# Patient Record
Sex: Female | Born: 1985 | Race: Black or African American | Hispanic: No | Marital: Single | Smoking: Current some day smoker
Health system: Southern US, Community
[De-identification: ages and names within clinical notes are randomized; demographics above are authoritative.]

## PROBLEM LIST (undated history)

## (undated) DIAGNOSIS — N390 Urinary tract infection, site not specified: Secondary | ICD-10-CM

## (undated) DIAGNOSIS — R519 Headache, unspecified: Secondary | ICD-10-CM

## (undated) DIAGNOSIS — A749 Chlamydial infection, unspecified: Secondary | ICD-10-CM

## (undated) DIAGNOSIS — F32A Depression, unspecified: Secondary | ICD-10-CM

## (undated) DIAGNOSIS — F419 Anxiety disorder, unspecified: Secondary | ICD-10-CM

## (undated) DIAGNOSIS — N809 Endometriosis, unspecified: Secondary | ICD-10-CM

## (undated) DIAGNOSIS — E039 Hypothyroidism, unspecified: Secondary | ICD-10-CM

## (undated) DIAGNOSIS — E079 Disorder of thyroid, unspecified: Secondary | ICD-10-CM

## (undated) DIAGNOSIS — E282 Polycystic ovarian syndrome: Secondary | ICD-10-CM

## (undated) DIAGNOSIS — E669 Obesity, unspecified: Secondary | ICD-10-CM

## (undated) DIAGNOSIS — F329 Major depressive disorder, single episode, unspecified: Secondary | ICD-10-CM

## (undated) DIAGNOSIS — R079 Chest pain, unspecified: Secondary | ICD-10-CM

## (undated) DIAGNOSIS — I10 Essential (primary) hypertension: Secondary | ICD-10-CM

## (undated) DIAGNOSIS — D649 Anemia, unspecified: Secondary | ICD-10-CM

## (undated) DIAGNOSIS — R0789 Other chest pain: Secondary | ICD-10-CM

## (undated) DIAGNOSIS — R112 Nausea with vomiting, unspecified: Secondary | ICD-10-CM

## (undated) DIAGNOSIS — R0602 Shortness of breath: Secondary | ICD-10-CM

## (undated) DIAGNOSIS — Z9889 Other specified postprocedural states: Secondary | ICD-10-CM

## (undated) DIAGNOSIS — R51 Headache: Secondary | ICD-10-CM

## (undated) HISTORY — DX: Morbid (severe) obesity due to excess calories: E66.01

## (undated) HISTORY — DX: Essential (primary) hypertension: I10

## (undated) HISTORY — PX: DILATION AND CURETTAGE OF UTERUS: SHX78

## (undated) HISTORY — PX: WISDOM TOOTH EXTRACTION: SHX21

## (undated) HISTORY — DX: Polycystic ovarian syndrome: E28.2

## (undated) HISTORY — PX: NO PAST SURGERIES: SHX2092

---
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
## 2004-06-16 ENCOUNTER — Emergency Department (HOSPITAL_COMMUNITY): Admission: EM | Admit: 2004-06-16 | Discharge: 2004-06-16 | Payer: Self-pay | Admitting: Emergency Medicine

## 2004-08-01 ENCOUNTER — Ambulatory Visit: Payer: Self-pay | Admitting: Family Medicine

## 2004-09-22 ENCOUNTER — Emergency Department (HOSPITAL_COMMUNITY): Admission: EM | Admit: 2004-09-22 | Discharge: 2004-09-22 | Payer: Self-pay | Admitting: Family Medicine

## 2004-09-23 ENCOUNTER — Inpatient Hospital Stay (HOSPITAL_COMMUNITY): Admission: EM | Admit: 2004-09-23 | Discharge: 2004-09-25 | Payer: Self-pay

## 2004-11-07 ENCOUNTER — Ambulatory Visit: Payer: Self-pay | Admitting: Family Medicine

## 2004-12-13 ENCOUNTER — Encounter: Admission: RE | Admit: 2004-12-13 | Discharge: 2005-03-13 | Payer: Self-pay | Admitting: Family Medicine

## 2005-02-10 ENCOUNTER — Emergency Department (HOSPITAL_COMMUNITY): Admission: EM | Admit: 2005-02-10 | Discharge: 2005-02-10 | Payer: Self-pay | Admitting: Family Medicine

## 2005-03-07 ENCOUNTER — Inpatient Hospital Stay (HOSPITAL_COMMUNITY): Admission: AD | Admit: 2005-03-07 | Discharge: 2005-03-07 | Payer: Self-pay | Admitting: Obstetrics and Gynecology

## 2005-04-26 ENCOUNTER — Inpatient Hospital Stay (HOSPITAL_COMMUNITY): Admission: AD | Admit: 2005-04-26 | Discharge: 2005-04-27 | Payer: Self-pay | Admitting: Obstetrics and Gynecology

## 2005-05-22 ENCOUNTER — Emergency Department (HOSPITAL_COMMUNITY): Admission: EM | Admit: 2005-05-22 | Discharge: 2005-05-22 | Payer: Self-pay | Admitting: Family Medicine

## 2005-06-02 ENCOUNTER — Emergency Department (HOSPITAL_COMMUNITY): Admission: EM | Admit: 2005-06-02 | Discharge: 2005-06-02 | Payer: Self-pay | Admitting: Family Medicine

## 2005-06-17 ENCOUNTER — Emergency Department (HOSPITAL_COMMUNITY): Admission: EM | Admit: 2005-06-17 | Discharge: 2005-06-18 | Payer: Self-pay | Admitting: Emergency Medicine

## 2005-06-21 ENCOUNTER — Emergency Department (HOSPITAL_COMMUNITY): Admission: EM | Admit: 2005-06-21 | Discharge: 2005-06-21 | Payer: Self-pay | Admitting: Emergency Medicine

## 2005-06-29 ENCOUNTER — Emergency Department (HOSPITAL_COMMUNITY): Admission: EM | Admit: 2005-06-29 | Discharge: 2005-06-29 | Payer: Self-pay | Admitting: Family Medicine

## 2005-07-26 ENCOUNTER — Emergency Department (HOSPITAL_COMMUNITY): Admission: AD | Admit: 2005-07-26 | Discharge: 2005-07-26 | Payer: Self-pay | Admitting: Family Medicine

## 2005-07-31 ENCOUNTER — Emergency Department (HOSPITAL_COMMUNITY): Admission: EM | Admit: 2005-07-31 | Discharge: 2005-07-31 | Payer: Self-pay | Admitting: Family Medicine

## 2005-08-14 ENCOUNTER — Ambulatory Visit: Payer: Self-pay | Admitting: Family Medicine

## 2005-09-06 ENCOUNTER — Emergency Department (HOSPITAL_COMMUNITY): Admission: EM | Admit: 2005-09-06 | Discharge: 2005-09-06 | Payer: Self-pay | Admitting: Family Medicine

## 2006-03-14 ENCOUNTER — Inpatient Hospital Stay (HOSPITAL_COMMUNITY): Admission: AD | Admit: 2006-03-14 | Discharge: 2006-03-14 | Payer: Self-pay | Admitting: Obstetrics and Gynecology

## 2006-04-10 ENCOUNTER — Ambulatory Visit (HOSPITAL_COMMUNITY): Admission: RE | Admit: 2006-04-10 | Discharge: 2006-04-10 | Payer: Self-pay | Admitting: Obstetrics & Gynecology

## 2006-05-03 ENCOUNTER — Inpatient Hospital Stay (HOSPITAL_COMMUNITY): Admission: AD | Admit: 2006-05-03 | Discharge: 2006-05-03 | Payer: Self-pay | Admitting: Obstetrics & Gynecology

## 2006-06-03 ENCOUNTER — Emergency Department (HOSPITAL_COMMUNITY): Admission: EM | Admit: 2006-06-03 | Discharge: 2006-06-03 | Payer: Self-pay | Admitting: Emergency Medicine

## 2006-12-18 ENCOUNTER — Ambulatory Visit: Payer: Self-pay | Admitting: Family Medicine

## 2006-12-21 ENCOUNTER — Ambulatory Visit: Payer: Self-pay | Admitting: *Deleted

## 2006-12-31 ENCOUNTER — Ambulatory Visit: Payer: Self-pay | Admitting: Family Medicine

## 2007-01-01 ENCOUNTER — Emergency Department (HOSPITAL_COMMUNITY): Admission: EM | Admit: 2007-01-01 | Discharge: 2007-01-01 | Payer: Self-pay | Admitting: Family Medicine

## 2007-02-04 ENCOUNTER — Ambulatory Visit: Payer: Self-pay | Admitting: Family Medicine

## 2007-06-30 ENCOUNTER — Encounter (INDEPENDENT_AMBULATORY_CARE_PROVIDER_SITE_OTHER): Payer: Self-pay | Admitting: *Deleted

## 2007-08-03 ENCOUNTER — Emergency Department (HOSPITAL_COMMUNITY): Admission: EM | Admit: 2007-08-03 | Discharge: 2007-08-03 | Payer: Self-pay | Admitting: Emergency Medicine

## 2007-09-22 ENCOUNTER — Inpatient Hospital Stay (HOSPITAL_COMMUNITY): Admission: AD | Admit: 2007-09-22 | Discharge: 2007-09-22 | Payer: Self-pay | Admitting: Obstetrics and Gynecology

## 2007-09-29 ENCOUNTER — Emergency Department (HOSPITAL_COMMUNITY): Admission: EM | Admit: 2007-09-29 | Discharge: 2007-09-30 | Payer: Self-pay | Admitting: Emergency Medicine

## 2008-05-01 ENCOUNTER — Inpatient Hospital Stay (HOSPITAL_COMMUNITY): Admission: AD | Admit: 2008-05-01 | Discharge: 2008-05-01 | Payer: Self-pay | Admitting: Obstetrics & Gynecology

## 2008-07-04 ENCOUNTER — Inpatient Hospital Stay (HOSPITAL_COMMUNITY): Admission: AD | Admit: 2008-07-04 | Discharge: 2008-07-05 | Payer: Self-pay | Admitting: Obstetrics & Gynecology

## 2008-09-13 ENCOUNTER — Inpatient Hospital Stay (HOSPITAL_COMMUNITY): Admission: AD | Admit: 2008-09-13 | Discharge: 2008-09-14 | Payer: Self-pay | Admitting: Obstetrics and Gynecology

## 2008-10-18 ENCOUNTER — Emergency Department (HOSPITAL_COMMUNITY): Admission: EM | Admit: 2008-10-18 | Discharge: 2008-10-18 | Payer: Self-pay | Admitting: Family Medicine

## 2008-12-05 ENCOUNTER — Emergency Department (HOSPITAL_COMMUNITY): Admission: EM | Admit: 2008-12-05 | Discharge: 2008-12-05 | Payer: Self-pay | Admitting: Emergency Medicine

## 2009-01-31 ENCOUNTER — Emergency Department (HOSPITAL_COMMUNITY): Admission: EM | Admit: 2009-01-31 | Discharge: 2009-01-31 | Payer: Self-pay | Admitting: Family Medicine

## 2009-03-12 ENCOUNTER — Emergency Department (HOSPITAL_COMMUNITY): Admission: EM | Admit: 2009-03-12 | Discharge: 2009-03-12 | Payer: Self-pay | Admitting: Family Medicine

## 2009-08-23 ENCOUNTER — Inpatient Hospital Stay (HOSPITAL_COMMUNITY): Admission: AD | Admit: 2009-08-23 | Discharge: 2009-08-23 | Payer: Self-pay | Admitting: Family Medicine

## 2009-11-07 ENCOUNTER — Inpatient Hospital Stay (HOSPITAL_COMMUNITY): Admission: AD | Admit: 2009-11-07 | Discharge: 2009-11-08 | Payer: Self-pay | Admitting: Obstetrics & Gynecology

## 2009-12-06 ENCOUNTER — Ambulatory Visit: Payer: Self-pay | Admitting: Obstetrics and Gynecology

## 2009-12-06 LAB — CONVERTED CEMR LAB
FSH: 5.5 milliintl units/mL
TSH: 1.625 microintl units/mL (ref 0.350–4.500)

## 2009-12-13 ENCOUNTER — Ambulatory Visit (HOSPITAL_COMMUNITY): Admission: RE | Admit: 2009-12-13 | Discharge: 2009-12-13 | Payer: Self-pay | Admitting: Obstetrics and Gynecology

## 2010-01-03 ENCOUNTER — Ambulatory Visit: Payer: Self-pay | Admitting: Obstetrics and Gynecology

## 2010-04-05 ENCOUNTER — Ambulatory Visit: Payer: Self-pay | Admitting: Obstetrics & Gynecology

## 2010-04-06 ENCOUNTER — Encounter: Payer: Self-pay | Admitting: Family

## 2010-04-06 LAB — CONVERTED CEMR LAB
Hemoglobin: 13.3 g/dL (ref 12.0–15.0)
RBC: 4.66 M/uL (ref 3.87–5.11)
WBC: 6.1 10*3/uL (ref 4.0–10.5)

## 2010-06-07 ENCOUNTER — Ambulatory Visit: Payer: Self-pay | Admitting: Obstetrics & Gynecology

## 2010-06-07 ENCOUNTER — Encounter: Payer: Self-pay | Admitting: Physician Assistant

## 2010-06-08 ENCOUNTER — Encounter: Payer: Self-pay | Admitting: Physician Assistant

## 2010-06-08 LAB — CONVERTED CEMR LAB
Trich, Wet Prep: NONE SEEN
WBC, Wet Prep HPF POC: NONE SEEN
Yeast Wet Prep HPF POC: NONE SEEN

## 2010-06-21 ENCOUNTER — Ambulatory Visit: Payer: Self-pay | Admitting: Obstetrics & Gynecology

## 2010-07-31 ENCOUNTER — Ambulatory Visit: Payer: Self-pay | Admitting: Obstetrics and Gynecology

## 2010-07-31 LAB — CONVERTED CEMR LAB
Trich, Wet Prep: NONE SEEN
Yeast Wet Prep HPF POC: NONE SEEN

## 2010-10-04 ENCOUNTER — Ambulatory Visit: Payer: Self-pay | Admitting: Obstetrics and Gynecology

## 2010-10-23 ENCOUNTER — Ambulatory Visit
Admission: RE | Admit: 2010-10-23 | Discharge: 2010-10-23 | Payer: Self-pay | Source: Home / Self Care | Attending: Obstetrics & Gynecology | Admitting: Obstetrics & Gynecology

## 2010-12-04 NOTE — Progress Notes (Signed)
NAME:  Lori Mcknight, Lori Mcknight                  ACCOUNT NO.:  [id number]  MEDICAL RECORD NO.:  [id number]           PATIENT TYPE:  LOCATION:  [facility]                     FACILITY:  PHYSICIAN:  Jaynie Collins, MD     DATE OF BIRTH:  September 20, 1986  DATE OF SERVICE:  10/23/2010                                 CLINIC NOTE  REASON FOR VISIT:  Followup PCOS.  The patient is a 25 year old nulligravida with a long history of PCOS and morbid obesity who has been treated with metformin here for followup.  The patient reports that she is only on metformin 1000 mg extended release at night and wants to see if she can increase her dosage to see if this can help with her ovulation.  She has lost 5 pounds since the last time she was seen and said that she is trying very hard to lose more weight.  She still complains of irregular periods and had her last menstrual cycle in December.  She says she has a longer period every time she is given a Provera challenge.  The patient wants to get pregnant soon and wants to know if there is anything else she could do.  She has no other symptoms.  PHYSICAL EXAMINATION:  VITAL SIGNS:  Stable.  Her blood pressure is 136/91, weight 322.6 pounds, height 66 inches. GENERAL:  No apparent distress. ABDOMEN:  Soft, nontender, nondistended. PELVIC:  Deferred. EXTREMITIES:  No cyanosis, clubbing, or edema.  ASSESSMENT AND PLAN:  The patient is a 25 year old nulligravida with polycystic ovarian syndrome here for followup.  As for treatment for PCOS, her dose was increased to 1500 mg of metformin ER at night.  The patient was instructed to take this with food.  As for infertility assistance, the patient was told that she could be a part of the Infertility [facility] here, but given her longstanding history of PCOS, she might benefit for a referral to a fertility specialist who will be able to do more than just clomiphene induction.  The patient does agree with this plan and will be  referred to Dr. April Manson, who is a reproductive and endocrinology and infertility specialist for further evaluation. The patient was told to call or come back in if she has any further gynecologic concerns and was commended on her weight loss and encouraged to continue to do so as this will help with her ovulation status and also her overall health.          ______________________________ Jaynie Collins, MD    UA/MEDQ  D:  10/23/2010  T:  10/24/2010  Job:  [id number]

## 2010-12-29 LAB — CBC
Hemoglobin: 12 g/dL (ref 12.0–15.0)
MCHC: 32.8 g/dL (ref 30.0–36.0)
MCV: 96.4 fL (ref 78.0–100.0)
RBC: 3.8 MIL/uL — ABNORMAL LOW (ref 3.87–5.11)
RDW: 13.1 % (ref 11.5–15.5)

## 2010-12-29 LAB — POCT PREGNANCY, URINE: Preg Test, Ur: NEGATIVE

## 2010-12-29 LAB — GC/CHLAMYDIA PROBE AMP, GENITAL
Chlamydia, DNA Probe: NEGATIVE
GC Probe Amp, Genital: NEGATIVE

## 2010-12-29 LAB — URINE MICROSCOPIC-ADD ON

## 2010-12-29 LAB — URINALYSIS, ROUTINE W REFLEX MICROSCOPIC
Bilirubin Urine: NEGATIVE
Glucose, UA: NEGATIVE mg/dL
Specific Gravity, Urine: 1.01 (ref 1.005–1.030)

## 2010-12-29 LAB — SAMPLE TO BLOOD BANK

## 2010-12-29 LAB — WET PREP, GENITAL: Clue Cells Wet Prep HPF POC: NONE SEEN

## 2011-01-15 LAB — WET PREP, GENITAL
Trich, Wet Prep: NONE SEEN
Yeast Wet Prep HPF POC: NONE SEEN

## 2011-01-15 LAB — GC/CHLAMYDIA PROBE AMP, GENITAL: GC Probe Amp, Genital: NEGATIVE

## 2011-01-15 LAB — POCT PREGNANCY, URINE: Preg Test, Ur: NEGATIVE

## 2011-02-28 NOTE — H&P (Signed)
NAME:  Lori Mcknight, Lori Mcknight                  ACCOUNT NO.:  [id number]   MEDICAL RECORD NO.:  [id number]          PATIENT TYPE:  INP   LOCATION:  0365                         FACILITY:  [facility]   PHYSICIAN:  Hollice Espy, M.D.DATE OF BIRTH:  08-23-1986   DATE OF ADMISSION:  09/22/2004  DATE OF DISCHARGE:                                HISTORY & PHYSICAL   PRIMARY CARE PHYSICIAN:  None.   CHIEF COMPLAINT:  Shortness of breath.   HISTORY OF PRESENT ILLNESS:  The patient is a 25 year old African-American  female with a past medical history of asthma x several years who normally  has had no severe exacerbation.  She said she has never had to come to the  hospital and rarely takes an inhaler.  In fact, she tells me right now she  is not on any medications at home.  She started having shortness of breath  developing yesterday.  It began to worsen and finally she became concerned  and came in on the evening of September 22, 2004.  At that time, it was felt  she had an asthma exacerbation.  She was given multiple doses of steroids,  nebulizer treatments, magnesium sulfate, and oxygen.  Initially on  presentation, her O2 saturations were 92% on room air when sitting but she  appeared to be in great respiratory distress.  When ambulating, she would  drop down to 88%.  She eventually required oxygen 3 L to keep her breathing  going.  Finally after multiple breathing attempts, it was felt that she  would not be able to safely be discharged from the emergency room and would  need admission for further treatment.  Upon discussion with the patient, she  complains of some tightness in her chest secondary to her breathing and  feels like it is hard to get a breath out.  She denies any headaches, visual  changes, or dysphagia.  She does complain of palpitations.  She denies any  productive cough.  She denies any abdominal pain, hematuria, dysuria,  constipation, or diarrhea.  She denies any focal extremity  pain.  Overall  she feels quite fatigued.   PAST MEDICAL HISTORY:  Asthma, hypertension, morbid obesity.   MEDICATIONS:  She states that she is not on any home medications.   ALLERGIES:  No known drug allergies.   SOCIAL HISTORY:  She denies any tobacco, alcohol, or drug use.   FAMILY HISTORY:  Noncontributory.   PHYSICAL EXAMINATION:  VITAL SIGNS:  On admission, temperature 98, heart  rate 124, blood pressure 134/63, respirations 20, O2 saturation 94% on 3 L.  GENERAL:  She appears to be morbidly obese but alert and oriented, in some  mild respiratory distress.  HEENT:  Normocephalic, atraumatic.  Mucous membranes are dry.  She has no  carotid bruits.  HEART:  Regular rhythm, S1 and S2, but tachycardic.  LUNGS:  Decreased breath sounds with bilateral wheezing throughout.  ABDOMEN:  Obese, soft, nontender, positive bowel sounds.  EXTREMITIES:  She is obese but no pitting edema.   LABORATORY DATA:  None.  X-ray shows no  focal signs of consolidation.   ASSESSMENT AND PLAN:  1.  Asthma exacerbation despite multiple attempts with nebulizers and      steroids and magnesium.  The patient is still wheezy and tight, still      has oxygen desaturations.  Twenty-four hour observation to treat with      more oxygen, steroids, and nebulizers and check a peak flow.  2.  Hypertension.  She is not on any medications normally.  Will add      Lopressor to control her heart rate from her albuterol.  3.  Morbid obesity.     Send   SKK/MEDQ  D:  09/23/2004  T:  09/23/2004  Job:  [id number]

## 2011-03-05 ENCOUNTER — Other Ambulatory Visit: Payer: Self-pay | Admitting: Obstetrics and Gynecology

## 2011-03-05 ENCOUNTER — Ambulatory Visit (INDEPENDENT_AMBULATORY_CARE_PROVIDER_SITE_OTHER): Payer: Self-pay | Admitting: Obstetrics and Gynecology

## 2011-03-05 DIAGNOSIS — Z01419 Encounter for gynecological examination (general) (routine) without abnormal findings: Secondary | ICD-10-CM

## 2011-03-05 DIAGNOSIS — Z124 Encounter for screening for malignant neoplasm of cervix: Secondary | ICD-10-CM

## 2011-03-06 NOTE — Group Therapy Note (Signed)
NAME:  Lori Mcknight, Lori Mcknight                  ACCOUNT NO.:  [id number]  MEDICAL RECORD NO.:  [id number]           PATIENT TYPE:  A  LOCATION:  [facility]                   FACILITY:  [facility]  PHYSICIAN:  Argentina Donovan, MD        DATE OF BIRTH:  01-13-1986  DATE OF SERVICE:  03/05/2011                                 CLINIC NOTE  HISTORY OF PRESENT ILLNESS:  The patient is a 25 year old African American female with polycystic ovarian syndrome in for an annual Pap smear.  PHYSICAL EXAMINATION:  ABDOMEN:  Soft, flat, obese, and no organomegaly palpable. PELVIC:  External genitalia is normal.  BUS within normal limits. Vagina is clean, well rugated.  Cervix easily seen in the anterior with a normal uterus of normal size, shape, consistency, retroverted, and easily palpable.  The adnexa obviously could not be palpable because of habitus of the patient 329 pounds and 5 feet 6 inches tall.  Her periods have always been irregular.  She has been on metformin on and off.  She had some difficulty taking them, so I have told her what we will do is start her on 500 b.i.d. with food for 2 weeks and then we will go up to 1000 as she is taking this after a couple of months.  If she is not completely regular with her periods, come in and we will add Clomid to that as she is attempting pregnancy.  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          ______________________________ Argentina Donovan, MD    PR/MEDQ  D:  03/05/2011  T:  03/06/2011  Job:  [id number]

## 2011-03-30 ENCOUNTER — Inpatient Hospital Stay (HOSPITAL_COMMUNITY)
Admission: AD | Admit: 2011-03-30 | Discharge: 2011-03-31 | Disposition: A | Discharge status: Home / Self Care | Payer: Self-pay | Source: Ambulatory Visit | Attending: Obstetrics and Gynecology | Admitting: Obstetrics and Gynecology

## 2011-03-30 DIAGNOSIS — R109 Unspecified abdominal pain: Secondary | ICD-10-CM | POA: Insufficient documentation

## 2011-03-30 DIAGNOSIS — N946 Dysmenorrhea, unspecified: Secondary | ICD-10-CM | POA: Insufficient documentation

## 2011-03-30 LAB — POCT PREGNANCY, URINE: Preg Test, Ur: NEGATIVE

## 2011-03-30 LAB — CBC
Hemoglobin: 13.6 g/dL (ref 12.0–15.0)
MCH: 31.4 pg (ref 26.0–34.0)
RBC: 4.33 MIL/uL (ref 3.87–5.11)

## 2011-03-30 LAB — URINALYSIS, ROUTINE W REFLEX MICROSCOPIC
Bilirubin Urine: NEGATIVE
Glucose, UA: NEGATIVE mg/dL
Hgb urine dipstick: NEGATIVE
Ketones, ur: NEGATIVE mg/dL
Protein, ur: NEGATIVE mg/dL
Urobilinogen, UA: 0.2 mg/dL (ref 0.0–1.0)

## 2011-03-30 LAB — DIFFERENTIAL
Basophils Absolute: 0 10*3/uL (ref 0.0–0.1)
Basophils Relative: 0 % (ref 0–1)
Eosinophils Absolute: 0.1 10*3/uL (ref 0.0–0.7)
Lymphs Abs: 4.1 10*3/uL — ABNORMAL HIGH (ref 0.7–4.0)
Monocytes Relative: 5 % (ref 3–12)
Neutrophils Relative %: 51 % (ref 43–77)

## 2011-03-31 ENCOUNTER — Inpatient Hospital Stay (HOSPITAL_COMMUNITY): Payer: Self-pay

## 2011-03-31 LAB — WET PREP, GENITAL: Trich, Wet Prep: NONE SEEN

## 2011-04-21 ENCOUNTER — Telehealth: Payer: Self-pay | Admitting: *Deleted

## 2011-04-29 MED ORDER — FLUCONAZOLE 150 MG PO TABS: 150.0000 mg | ORAL_TABLET | Freq: Once | ORAL | Status: AC

## 2011-04-29 NOTE — Telephone Encounter (Signed)
Spoke w/pt- med called to pharmacy per standing order

## 2011-06-19 ENCOUNTER — Ambulatory Visit (INDEPENDENT_AMBULATORY_CARE_PROVIDER_SITE_OTHER): Payer: Self-pay | Admitting: Obstetrics and Gynecology

## 2011-06-19 ENCOUNTER — Encounter: Payer: Self-pay | Admitting: Obstetrics and Gynecology

## 2011-06-19 VITALS — BP 129/91 | HR 102 | Temp 96.7°F | Ht 67.0 in | Wt 327.4 lb

## 2011-06-19 DIAGNOSIS — E282 Polycystic ovarian syndrome: Secondary | ICD-10-CM

## 2011-06-19 DIAGNOSIS — IMO0002 Reserved for concepts with insufficient information to code with codable children: Secondary | ICD-10-CM

## 2011-06-19 DIAGNOSIS — K651 Peritoneal abscess: Secondary | ICD-10-CM

## 2011-06-19 MED ORDER — CEPHALEXIN 500 MG PO CAPS: 500.0000 mg | ORAL_CAPSULE | Freq: Four times a day (QID) | ORAL | Status: AC

## 2011-06-19 MED ORDER — CLOMIPHENE CITRATE 50 MG PO TABS: 50.0000 mg | ORAL_TABLET | Freq: Every day | ORAL | Status: DC

## 2011-06-19 NOTE — Progress Notes (Signed)
This patient is a 25 year old African American female with polycystic ovarian syndrome. She's been on Provera first 10 days of each month for the past several months. She's also tried metformin but couldn't tolerate it because of the side effects. She desires to tried Clomid we spent a long time discussing the temperature chart how Clomid works dangers of Clomid and how to take it. Were going to give her 50 mg of Clomid from day 5 through 9 of her cycle. Will include 3 renewal. I've told her she is not pregnant by the time she uses up to 3 cycles to please come back in. She seems to understand how to take it in the temperature chart seem clear to her as well as her mother.  Impression: Polycystic ovarian syndrome attempting pregnancy.

## 2011-06-20 ENCOUNTER — Emergency Department (HOSPITAL_COMMUNITY)
Admission: EM | Admit: 2011-06-20 | Discharge: 2011-06-21 | Disposition: A | Discharge status: Home / Self Care | Payer: Self-pay | Attending: Emergency Medicine | Admitting: Emergency Medicine

## 2011-06-20 DIAGNOSIS — L02219 Cutaneous abscess of trunk, unspecified: Secondary | ICD-10-CM | POA: Insufficient documentation

## 2011-06-20 DIAGNOSIS — I10 Essential (primary) hypertension: Secondary | ICD-10-CM | POA: Insufficient documentation

## 2011-06-22 ENCOUNTER — Emergency Department (HOSPITAL_COMMUNITY)
Admission: EM | Admit: 2011-06-22 | Discharge: 2011-06-22 | Disposition: A | Discharge status: Home / Self Care | Payer: Self-pay | Attending: Emergency Medicine | Admitting: Emergency Medicine

## 2011-06-22 DIAGNOSIS — L02219 Cutaneous abscess of trunk, unspecified: Secondary | ICD-10-CM | POA: Insufficient documentation

## 2011-06-25 ENCOUNTER — Telehealth: Payer: Self-pay | Admitting: *Deleted

## 2011-06-25 NOTE — Telephone Encounter (Signed)
Spoke with Dr. Okey Dupre re: patient request- Dr. Okey Dupre not familiar with Fertile Aid- called pharmacy and was told it is basically a herbal remedy that doesn't have proven clinical studies to show if it helps or harms patients. Dr. Okey Dupre advised for pt to only take clomid, prenatal vitamin with folic acid and to not take fertile aid or primrose. Called patient and explained to her Dr. Okey Dupre wants her not to take Fertile Aid or primrose because there  are no studies to show if they help or harm patients  Or if they  affect clomid efficacy. Also told pt. He avised she take a prenatal vitamin with folic acid and clomid . Pt. Voices understanding

## 2011-06-25 NOTE — Telephone Encounter (Signed)
Pt. Called today at 11:13 am and left a message she is a patient of Dr. Okey Dupre "and he started  Me on clomid days 5-9. I haven't started it yet. I had a question -can I take it with vitamins?- does it work Occupational hygienist other? Please call me"

## 2011-06-25 NOTE — Telephone Encounter (Signed)
Called pt, and she stated she had not started clomid yet and wanted to know if taking an otc med called fertile aid, which she thought was like a vitamin  And also evening primrose would interfer with the clomid.  Informed pt, I was sure about the primrose would need to check with Dr. Okey Dupre when he is in clinic later today and then call pt.

## 2011-07-01 ENCOUNTER — Telehealth: Payer: Self-pay | Admitting: *Deleted

## 2011-07-01 NOTE — Telephone Encounter (Signed)
Pt left message stating that she was given a BBT chart @ last visit from Dr. Okey Dupre. She was also given a Rx for clomid to take on days 5-9 of her cycle. She is unclear as to when she is supposed to take her temperature and how to use the BBT. She would like a call back with instructions.

## 2011-07-02 NOTE — Telephone Encounter (Signed)
Spoke w/pt. Review of BBT chart done- pt instructed to take her temp daily before getting out of bed and chart on graph. She should take the clomid on days 5-9 of her cycle.  Also, pt should have intercourse on the days that her temp is lower since this may be an indication of ovulation. Pt stated that she started her period yesterday. She asked what she should do if she does not get a period next month. I stated that she should take a home UPT and call us if it is negative. Pt voiced understanding

## 2011-07-14 LAB — GC/CHLAMYDIA PROBE AMP, GENITAL
Chlamydia, DNA Probe: NEGATIVE
GC Probe Amp, Genital: NEGATIVE

## 2011-07-14 LAB — WET PREP, GENITAL
Clue Cells Wet Prep HPF POC: NONE SEEN
Yeast Wet Prep HPF POC: NONE SEEN

## 2011-07-17 LAB — WET PREP, GENITAL: Trich, Wet Prep: NONE SEEN

## 2011-07-17 LAB — URINALYSIS, ROUTINE W REFLEX MICROSCOPIC
Bilirubin Urine: NEGATIVE
Hgb urine dipstick: NEGATIVE
Ketones, ur: NEGATIVE mg/dL
Protein, ur: NEGATIVE mg/dL
Specific Gravity, Urine: 1.025 (ref 1.005–1.030)
Urobilinogen, UA: 0.2 mg/dL (ref 0.0–1.0)

## 2011-07-17 LAB — GC/CHLAMYDIA PROBE AMP, GENITAL: GC Probe Amp, Genital: NEGATIVE

## 2011-07-17 LAB — CBC
MCHC: 31.9 g/dL (ref 30.0–36.0)
RBC: 4.45 MIL/uL (ref 3.87–5.11)

## 2011-07-18 LAB — PREGNANCY, URINE: Preg Test, Ur: NEGATIVE

## 2011-07-21 LAB — CBC
Hemoglobin: 14
RBC: 4.48

## 2011-07-21 LAB — URINALYSIS, ROUTINE W REFLEX MICROSCOPIC
Ketones, ur: NEGATIVE
Nitrite: NEGATIVE
Protein, ur: NEGATIVE
pH: 6

## 2011-07-21 LAB — WET PREP, GENITAL: Clue Cells Wet Prep HPF POC: NONE SEEN

## 2011-08-01 ENCOUNTER — Telehealth (HOSPITAL_COMMUNITY): Payer: Self-pay | Admitting: *Deleted

## 2011-08-01 ENCOUNTER — Encounter (HOSPITAL_COMMUNITY): Payer: Self-pay | Admitting: *Deleted

## 2011-08-01 ENCOUNTER — Inpatient Hospital Stay (HOSPITAL_COMMUNITY): Payer: Self-pay

## 2011-08-01 ENCOUNTER — Inpatient Hospital Stay (HOSPITAL_COMMUNITY)
Admission: AD | Admit: 2011-08-01 | Discharge: 2011-08-01 | Disposition: A | Discharge status: Home / Self Care | Payer: Self-pay | Source: Ambulatory Visit | Attending: Obstetrics and Gynecology | Admitting: Obstetrics and Gynecology

## 2011-08-01 DIAGNOSIS — R1032 Left lower quadrant pain: Secondary | ICD-10-CM | POA: Insufficient documentation

## 2011-08-01 DIAGNOSIS — N949 Unspecified condition associated with female genital organs and menstrual cycle: Secondary | ICD-10-CM | POA: Insufficient documentation

## 2011-08-01 DIAGNOSIS — R102 Pelvic and perineal pain: Secondary | ICD-10-CM | POA: Diagnosis present

## 2011-08-01 HISTORY — DX: Urinary tract infection, site not specified: N39.0

## 2011-08-01 HISTORY — DX: Chlamydial infection, unspecified: A74.9

## 2011-08-01 HISTORY — DX: Obesity, unspecified: E66.9

## 2011-08-01 LAB — URINALYSIS, ROUTINE W REFLEX MICROSCOPIC
Glucose, UA: NEGATIVE mg/dL
Hgb urine dipstick: NEGATIVE
Specific Gravity, Urine: >1.03 — ABNORMAL HIGH (ref 1.005–1.030)
pH: 6 (ref 5.0–8.0)

## 2011-08-01 LAB — CBC
HCT: 38.1 % (ref 36.0–46.0)
MCHC: 32.8 g/dL (ref 30.0–36.0)
MCV: 92.3 fL (ref 78.0–100.0)
RDW: 12.7 % (ref 11.5–15.5)

## 2011-08-01 LAB — WET PREP, GENITAL
Trich, Wet Prep: NONE SEEN
Yeast Wet Prep HPF POC: NONE SEEN

## 2011-08-01 LAB — POCT PREGNANCY, URINE: Preg Test, Ur: NEGATIVE

## 2011-08-01 NOTE — Progress Notes (Signed)
Pt has a history of PCOS and is on clomid.

## 2011-08-01 NOTE — Progress Notes (Signed)
Pt states that on 10-8 she had some pain and dizziness. Stopped on 10-12 and started again yesterday. Pain is in the left lower abdomen that she describes as achy, sometime sharp and crampy and worse with movement.

## 2011-08-01 NOTE — ED Provider Notes (Signed)
History     Chief Complaint  Patient presents with  . Abdominal Pain   Abdominal Pain This is a new problem. The current episode started 1 to 4 weeks ago. The onset quality is gradual. The problem occurs intermittently. The most recent episode lasted 1 day. The problem has been gradually worsening. The pain is located in the LLQ. The pain is at a severity of 8/10. The pain is severe. The quality of the pain is aching, sharp and cramping. The abdominal pain does not radiate. Pertinent negatives include no constipation, diarrhea, dysuria, fever, headaches, hematuria, nausea or vomiting. It is movement what aggravates the pain. The pain is relieved by nothing. She has tried oral narcotic analgesics for the symptoms. The treatment provided no relief. There is no history of abdominal surgery.  Pain started on 07/21/11 but was on-off. However, it got more frequent and intense since last evening.  Past History: Diagnosed with PCO syndrome on 01/2010. She has been having irregular periods since age 41 yr, was tried on Metformin and changed to Clomet since 06/2011.  She has a negative home pregnancy test this morning, is presently sexually active with a single female partner since the last 3 years. She has h/o Herpes 22yrs ago.  OB History    Grav Para Term Preterm Abortions TAB SAB Ect Mult Living   0               Past Medical History  Diagnosis Date  . PCOS (polycystic ovarian syndrome)   . Asthma   . Hypertension     had rx from hosp, ran out 2 yrs ago  . Obese   . Urinary tract infection   . Chlamydia   . Genital herpes     Past Surgical History  Procedure Date  . Wisdom tooth extraction     Family History  Problem Relation Age of Onset  . Heart disease Mother   . Diabetes Father   . Heart disease Father   . Heart disease Maternal Grandmother   . Diabetes Maternal Grandfather   . Heart disease Maternal Grandfather     History  Substance Use Topics  . Smoking status: Former  Games developer  . Smokeless tobacco: Never Used   Comment: quit 2009  . Alcohol Use: No    Allergies: No Known Allergies  Prescriptions prior to admission  Medication Sig Dispense Refill  . HYDROcodone-acetaminophen (NORCO) 5-325 MG per tablet Take 1 tablet by mouth every 6 (six) hours as needed. pain       . prenatal vitamin w/FE, FA (PRENATAL 1 + 1) 27-1 MG TABS Take 1 tablet by mouth daily.        . clomiPHENE (CLOMID) 50 MG tablet Take 50 mg by mouth daily.        . medroxyPROGESTERone (PROVERA) 10 MG tablet Take 10 mg by mouth daily.       Marland Kitchen DISCONTD: clomiPHENE (CLOMID) 50 MG tablet Take 1 tablet (50 mg total) by mouth daily.  5 tablet  3    Review of Systems  Constitutional: Negative.  Negative for fever.  HENT: Negative for hearing loss and sore throat.   Respiratory: Negative.   Cardiovascular: Negative for chest pain and leg swelling.  Gastrointestinal: Positive for abdominal pain. Negative for nausea, vomiting, diarrhea and constipation.  Genitourinary: Negative for dysuria and hematuria.  Musculoskeletal: Negative.   Neurological: Negative for headaches.   Physical Exam   Blood pressure 125/65, pulse 72, temperature 98.6 F (37 C),  temperature source Oral, resp. rate 20, height 5\' 5"  (1.651 m), weight 153.134 kg (337 lb 9.6 oz), last menstrual period 07/03/2011, SpO2 99.00%.  Physical Exam  Constitutional: She is oriented to person, place, and time. She appears well-developed and well-nourished. She appears distressed.  HENT:  Head: Atraumatic.  Eyes: Conjunctivae and EOM are normal. Pupils are equal, round, and reactive to light. Right eye exhibits no discharge. Left eye exhibits no discharge. No scleral icterus.  Neck: Normal range of motion. Neck supple.  Cardiovascular: Normal rate, regular rhythm and intact distal pulses.   Respiratory: Effort normal and breath sounds normal.  GI: Soft. Bowel sounds are normal. She exhibits no distension. There is tenderness. There is  no rebound and no guarding.  Genitourinary: Cervix exhibits discharge (blood tinged mucous). Vaginal discharge (blood tinged) found.  Musculoskeletal: Normal range of motion.  Neurological: She is alert and oriented to person, place, and time.  Skin: Skin is warm and dry.    MAU Course  Procedures  MDM US abdomen and Pelvis ordered for a likely Ovarian cyst or a Corpus Luteal cyst. Nephrolithiasis is unlikely as there is no h/o urinary complaints and fever. UA should help rule it out  Assessment and Plan  1. Ovarian Cyst or Corpus Luteal cyst: US abdomen pelvis ordered to confirm.  2. Nephrolithiasis: is unlikely and an UA should help rule it out   Chetan Kapat 08/01/2011, 1:47 PM   Results for orders placed during the hospital encounter of 08/01/11 (from the past 24 hour(s))  URINALYSIS, ROUTINE W REFLEX MICROSCOPIC     Status: Abnormal   Collection Time   08/01/11 11:25 AM      Component Value Range   Color, Urine YELLOW  YELLOW    Appearance CLEAR  CLEAR    Specific Gravity, Urine >1.030 (*) 1.005 - 1.030    pH 6.0  5.0 - 8.0    Glucose, UA NEGATIVE  NEGATIVE (mg/dL)   Hgb urine dipstick NEGATIVE  NEGATIVE    Bilirubin Urine NEGATIVE  NEGATIVE    Ketones, ur NEGATIVE  NEGATIVE (mg/dL)   Protein, ur NEGATIVE  NEGATIVE (mg/dL)   Urobilinogen, UA 0.2  0.0 - 1.0 (mg/dL)   Nitrite NEGATIVE  NEGATIVE    Leukocytes, UA NEGATIVE  NEGATIVE   POCT PREGNANCY, URINE     Status: Normal   Collection Time   08/01/11 11:45 AM      Component Value Range   Preg Test, Ur NEGATIVE    CBC     Status: Normal   Collection Time   08/01/11  3:04 PM      Component Value Range   WBC 6.5  4.0 - 10.5 (K/uL)   RBC 4.13  3.87 - 5.11 (MIL/uL)   Hemoglobin 12.5  12.0 - 15.0 (g/dL)   HCT 11.9  14.7 - 82.9 (%)   MCV 92.3  78.0 - 100.0 (fL)   MCH 30.3  26.0 - 34.0 (pg)   MCHC 32.8  30.0 - 36.0 (g/dL)   RDW 56.2  13.0 - 86.5 (%)   Platelets 209  150 - 400 (K/uL)  WET PREP, GENITAL     Status:  Abnormal   Collection Time   08/01/11  3:44 PM      Component Value Range   Yeast, Wet Prep NONE SEEN  NONE SEEN    Trich, Wet Prep NONE SEEN  NONE SEEN    Clue Cells, Wet Prep FEW (*) NONE SEEN    WBC, Wet  Prep HPF POC FEW (*) NONE SEEN     US Transvaginal Non-ob  08/01/2011  *RADIOLOGY REPORT*  Clinical Data: Left-sided adnexal pain, taking Clomid for irregular cycles.  TRANSVAGINAL ULTRASOUND OF PELVIS  Technique:  Transvaginal ultrasound examination of the pelvis was performed including evaluation of the uterus, ovaries, adnexal regions, and pelvic cul-de-sac.  Comparison:  Pelvic ultrasound - 03/31/2011; 12/13/2009  Findings:  Uterus:  Normal in size, measuring 9.9 x 4.9 x 4.9 cm.  Anteverted. Homogeneous echotexture.  No discrete uterine mass.  Endometrium: Normal thickness, measuring 12 mm in diameter.  No discrete endometrial mass.  Right ovary: Normal in size, measuring 2.5 x 1.3 x 1.3 cm.  No discrete ovarian or adnexal lesion.  Left ovary: Normal in size, measuring 2.7 x 1.5 x 2.2 cm. Several tiny peripheral follicles identified within the left ovary.  No discrete ovarian or adnexal lesion.  Other Findings:  Small amount of free fluid within the pelvis, likely physiologic.  IMPRESSION: Normal pelvic ultrasound.  Original Report Authenticated By: Waynard Reeds, M.D.   Pt has basal body temp chart which shows evidence of ovulation around 10/7 or 10/8, this was the time of the onset of her pain.    A/P: Pelvic pain - ? Related to clomid/ovulation  Instructed patient that she should have a period soon if ovulation occurred and she is not pregnant - if no period within the next two weeks, will call clinic for instructions, if she does have a period, but is not pregnant by the end of her rx for clomid, she will f/u in clinic as planned

## 2011-08-02 NOTE — ED Provider Notes (Signed)
Agree with above note.  Erian Rosengren H. 08/02/2011 5:01 AM

## 2011-08-06 ENCOUNTER — Ambulatory Visit (INDEPENDENT_AMBULATORY_CARE_PROVIDER_SITE_OTHER): Payer: Self-pay | Admitting: Obstetrics & Gynecology

## 2011-08-06 ENCOUNTER — Encounter: Payer: Self-pay | Admitting: Obstetrics & Gynecology

## 2011-08-06 VITALS — BP 155/84 | HR 85 | Temp 96.5°F | Ht 65.0 in | Wt 338.0 lb

## 2011-08-06 DIAGNOSIS — N949 Unspecified condition associated with female genital organs and menstrual cycle: Secondary | ICD-10-CM

## 2011-08-06 DIAGNOSIS — R102 Pelvic and perineal pain: Secondary | ICD-10-CM

## 2011-08-06 DIAGNOSIS — Z23 Encounter for immunization: Secondary | ICD-10-CM

## 2011-08-06 MED ORDER — INFLUENZA VIRUS VACC SPLIT PF IM SUSP: 0.5000 mL | Freq: Once | INTRAMUSCULAR | Status: DC

## 2011-08-06 MED ORDER — IBUPROFEN 200 MG PO TABS: 800.0000 mg | ORAL_TABLET | Freq: Four times a day (QID) | ORAL | Status: AC | PRN

## 2011-08-06 NOTE — Progress Notes (Signed)
  Subjective:    Patient ID: Lori Mcknight, female    DOB: 1985/11/30, 25 y.o.   MRN: [id number]  HPI  25 yo S AA G0 who is here for follow up after a MAU visit on 08-01-11 for LLQ pain for several weeks.  Workup included negative cervical cultures, normal WBC, and a normal ultrasound that showed a small amt of free fluid. She denies dysparunia.  Review of Systems    would like to get pregnant, she is inputting her basal body temp info into a website called Fertility http://www.pope.info/. I reviewed it and it appears that she ovulated in Sept. Objective:   Physical Exam  Normal pelvic exam- no masses or tenderness      Assessment & Plan:  Pelvic pain-probably from ovulation. IBU prescription given Flu shot today

## 2011-09-17 ENCOUNTER — Other Ambulatory Visit: Payer: Self-pay

## 2011-09-17 ENCOUNTER — Emergency Department (HOSPITAL_COMMUNITY)
Admission: EM | Admit: 2011-09-17 | Discharge: 2011-09-17 | Discharge status: Against Medical Advice | Payer: Self-pay | Attending: Emergency Medicine | Admitting: Emergency Medicine

## 2011-09-17 ENCOUNTER — Encounter (HOSPITAL_COMMUNITY): Payer: Self-pay | Admitting: Emergency Medicine

## 2011-09-17 DIAGNOSIS — R079 Chest pain, unspecified: Secondary | ICD-10-CM | POA: Insufficient documentation

## 2011-09-17 DIAGNOSIS — R0602 Shortness of breath: Secondary | ICD-10-CM | POA: Insufficient documentation

## 2011-09-17 NOTE — ED Notes (Signed)
Pt states that she has chest pain at times with fluttering for a while now but has never seen a cardiologist has been looked at this before and ekg was normal, some intermit sob

## 2011-11-06 ENCOUNTER — Encounter: Payer: Self-pay | Admitting: Physician Assistant

## 2011-11-06 ENCOUNTER — Ambulatory Visit (INDEPENDENT_AMBULATORY_CARE_PROVIDER_SITE_OTHER): Payer: Self-pay | Admitting: Physician Assistant

## 2011-11-06 DIAGNOSIS — E669 Obesity, unspecified: Secondary | ICD-10-CM

## 2011-11-06 DIAGNOSIS — E282 Polycystic ovarian syndrome: Secondary | ICD-10-CM | POA: Insufficient documentation

## 2011-11-06 DIAGNOSIS — E66813 Obesity, class 3: Secondary | ICD-10-CM | POA: Insufficient documentation

## 2011-11-06 DIAGNOSIS — N979 Female infertility, unspecified: Secondary | ICD-10-CM

## 2011-11-06 HISTORY — DX: Obesity, class 3: E66.813

## 2011-11-06 HISTORY — DX: Morbid (severe) obesity due to excess calories: E66.01

## 2011-11-06 NOTE — Patient Instructions (Signed)
Preparing for Pregnancy Preparing for pregnancy (preconceptual care) by getting counseling and information from your caregiver before getting pregnant is a good idea. It will help you and your baby have a better chance to have a healthy, safe pregnancy and delivery of your baby. Make an appointment with your caregiver to talk about your health, medical, and family history and how to prepare yourself before getting pregnant. Your caregiver will do a complete physical exam and a Pap test. They will want to know:  About you, your spouse or partner, and your family's medical and genetic history.   If you are eating a balanced diet and drinking enough fluids.   What vitamins and mineral supplements you are taking. This includes taking folic acid before getting pregnant to help prevent birth defects.   What medications you are taking including prescription, over-the-counter and herbal medications.   If there is any substance abuse like alcohol, smoking, and illegal drugs.   If there is any mental or physical domestic violence.   If there is any risk of sexually transmitted disease between you and your partner.   What immunizations and vaccinations you have had and what you may need before getting pregnant.   If you should get tested for HIV infection.   If there is any exposure to chemical or toxic substances at home or work.   If there are medical problems you have that need to be treated and kept under control before getting pregnant such as diabetes, high blood pressure or others.   If there were any past surgeries, pregnancies and problems with them.   What your current weight is and to set a goal as to how much weight you should gain while pregnant. Also, they will check if you should lose or gain weight before getting pregnant.   What is your exercise routine and what it is safe when you are pregnant.   If there are any physical disabilities that need to be addressed.   About spacing  your pregnancies when there are other children.   If there is a financial problem that may affect you having a child.  After talking about the above points with your caregiver, your caregiver will give you advice on how to help treat and work with you on solving any issues, if necessary, before getting pregnant. The goal is to have a healthy and safe pregnancy for you and your baby. You should keep an accurate record of your menstrual periods because it will help in determining your due date. Immunizations that you should have before getting pregnant:   Regular measles, German measles (rubella) and mumps.   Tetanus and diphtheria.   Chickenpox, if not immune.   Herpes zoster (Varicella) if not immune.   Human papilloma virus vaccine (HPV) between the age of 9 and 26 years old.   Hepatitis A vaccine.   Hepatitis B vaccine.   Influenza vaccine.   Pneumococcal vaccine (pneumonia).  You should avoid getting pregnant for one month after getting vaccinated with a live virus vaccine such as German measles (rubella) vaccine. Other immunizations may be necessary depending on where you live, such as malaria. Ask your caregiver if any other immunizations are needed for you. HOME CARE INSTRUCTIONS   Follow the advice of your caregiver.   Before getting pregnant:   Begin taking vitamins, supplements, and 0.4 milligrams folic acid daily.   Get your immunizations up-to-date.   Get help from a nutrition counselor if you do not understand what   a balanced diet is, need help with a special medical diet or if you need help to lose or gain weight.   Begin exercising.   Stop smoking, taking illegal drugs, and drinking alcoholic beverages.   Get counseling if there is and type of domestic violence.   Get checked for sexually transmitted diseases including HIV.   Get any medical problems under control (diabetes, high blood pressure, convulsions, asthma or others).   Resolve any financial  concerns.   Be sure you and your spouse or partner are ready to have a baby.   Keep an accurate record of your menstrual periods.  Document Released: 09/11/2008 Document Revised: 06/11/2011 Document Reviewed: 09/11/2008 [facility] Patient Information 2012 [place], [place].Calorie Counting Diet A calorie counting diet requires you to eat the number of calories that are right for you in a day. Calories are the measurement of how much energy you get from the food you eat. Eating the right amount of calories is important for staying at a healthy weight. If you eat too many calories, your body will store them as fat and you may gain weight. If you eat too few calories, you may lose weight. Counting the number of calories you eat during a day will help you know if you are eating the right amount. A Registered Dietitian can determine how many calories you need in a day. The amount of calories needed varies from person to person. If your goal is to lose weight, you will need to eat fewer calories. Losing weight can benefit you if you are overweight or have health problems such as heart disease, high blood pressure, or diabetes. If your goal is to gain weight, you will need to eat more calories. Gaining weight may be necessary if you have a certain health problem that causes your body to need more energy. TIPS Whether you are increasing or decreasing the number of calories you eat during a day, it may be hard to get used to changes in what you eat and drink. The following are tips to help you keep track of the number of calories you eat.  Measure foods at home with measuring cups. This helps you know the amount of food and number of calories you are eating.   Restaurants often serve food in amounts that are larger than 1 serving. While eating out, estimate how many servings of a food you are given. For example, a serving of cooked rice is  cup or about the size of half of a fist. Knowing serving sizes will help  you be aware of how much food you are eating at restaurants.   Ask for smaller portion sizes or child-size portions at restaurants.   Plan to eat half of a meal at a restaurant. Take the rest home or share the other half with a friend.   Read the Nutrition Facts panel on food labels for calorie content and serving size. You can find out how many servings are in a package, the size of a serving, and the number of calories each serving has.   For example, a package might contain 3 cookies. The Nutrition Facts panel on that package says that 1 serving is 1 cookie. Below that, it will say there are 3 servings in the container. The calories section of the Nutrition Facts label says there are 90 calories. This means there are 90 calories in 1 cookie (1 serving). If you eat 1 cookie you have eaten 90 calories. If you eat all  3 cookies, you have eaten 270 calories (3 servings x 90 calories = 270 calories).  The list below tells you how big or small some common portion sizes are.  1 oz.........4 stacked dice.   3 oz........Marland KitchenDeck of cards.   1 tsp.......Marland KitchenTip of little finger.   1 tbs......Marland KitchenMarland KitchenThumb.   2 tbs.......Marland KitchenGolf ball.    cup......Marland KitchenHalf of a fist.   1 cup.......Marland KitchenA fist.  KEEP A FOOD LOG Write down every food item you eat, the amount you eat, and the number of calories in each food you eat during the day. At the end of the day, you can add up the total number of calories you have eaten. It may help to keep a list like the one below. Find out the calorie information by reading the Nutrition Facts panel on food labels. Breakfast  Bran cereal (1 cup, 110 calories).   Fat-free milk ( cup, 45 calories).  Snack  Apple (1 medium, 80 calories).  Lunch  Spinach (1 cup, 20 calories).   Tomato ( medium, 20 calories).   Chicken breast strips (3 oz, 165 calories).   Shredded cheddar cheese ( cup, 110 calories).   Light [place] dressing (2 tbs, 60 calories).   Whole-wheat bread (1 slice,  80 calories).   Tub margarine (1 tsp, 35 calories).   Vegetable soup (1 cup, 160 calories).  Dinner  Pork chop (3 oz, 190 calories).   Brown rice (1 cup, 215 calories).   Steamed broccoli ( cup, 20 calories).   Strawberries (1  cup, 65 calories).   Whipped cream (1 tbs, 50 calories).  Daily Calorie Total: 1425 Document Released: 09/29/2005 Document Revised: 06/11/2011 Document Reviewed: 03/26/2007 [facility] Patient Information 2012 [place], [place].Polycystic Ovarian Syndrome Polycystic ovarian syndrome is a condition with a number of problems. One problem is with the ovaries. The ovaries are organs located in the female pelvis, on each side of the uterus. Usually, during the menstrual cycle, an egg is released from 1 ovary every month. This is called ovulation. When the egg is fertilized, it goes into the womb (uterus), which allows for the growth of a baby. The egg travels from the ovary through the fallopian tube to the uterus. The ovaries also make the hormones estrogen and progesterone. These hormones help the development of a woman's breasts, body shape, and body hair. They also regulate the menstrual cycle and pregnancy. Sometimes, cysts form in the ovaries. A cyst is a fluid-filled sac. On the ovary, different types of cysts can form. The most common type of ovarian cyst is called a functional or ovulation cyst. It is normal, and often forms during the normal menstrual cycle. Each month, a woman's ovaries grow tiny cysts that hold the eggs. When an egg is fully grown, the sac breaks open. This releases the egg. Then, the sac which released the egg from the ovary dissolves. In one type of functional cyst, called a follicle cyst, the sac does not break open to release the egg. It may actually continue to grow. This type of cyst usually disappears within 1 to 3 months.  One type of cyst problem with the ovaries is called Polycystic Ovarian Syndrome (PCOS). In this condition, many follicle  cysts form, but do not rupture and produce an egg. This health problem can affect the following:  Menstrual cycle.   Heart.   Obesity.   Cancer of the uterus.   Fertility.   Blood vessels.   Hair growth (face and body) or baldness.   Hormones.  Appearance.   High blood pressure.   Stroke.   Insulin production.   Inflammation of the liver.   Elevated blood cholesterol and triglycerides.  CAUSES   No one knows the exact cause of PCOS.   Women with PCOS often have a mother or sister with PCOS. There is not yet enough proof to say this is inherited.   Many women with PCOS have a weight problem.   Researchers are looking at the relationship between PCOS and the body's ability to make insulin. Insulin is a hormone that regulates the change of sugar, starches, and other food into energy for the body's use, or for storage. Some women with PCOS make too much insulin. It is possible that the ovaries react by making too many female hormones, called androgens. This can lead to acne, excessive hair growth, weight gain, and ovulation problems.   Too much production of luteinizing hormone (LH) from the pituitary gland in the brain stimulates the ovary to produce too much female hormone (androgen).  SYMPTOMS   Infrequent or no menstrual periods, and/or irregular bleeding.   Inability to get pregnant (infertility), because of not ovulating.   Increased growth of hair on the face, chest, stomach, back, thumbs, thighs, or toes.   Acne, oily skin, or dandruff.   Pelvic pain.   Weight gain or obesity, usually carrying extra weight around the waist.   Type 2 diabetes (this is the diabetes that usually does not need insulin).   High cholesterol.   High blood pressure.   Female-pattern baldness or thinning hair.   Patches of thickened and dark brown or black skin on the neck, arms, breasts, or thighs.   Skin tags, or tiny excess flaps of skin, in the armpits or neck area.   Sleep  apnea (excessive snoring and breathing stops at times while asleep).   Deepening of the voice.   Gestational diabetes when pregnant.   Increased risk of miscarriage with pregnancy.  DIAGNOSIS  There is no single test to diagnose PCOS.   Your caregiver will:   Take a medical history.   Perform a pelvic exam.   Perform an ultrasound.   Check your female and female hormone levels.   Measure glucose or sugar levels in the blood.   Do other blood tests.   If you are producing too many female hormones, your caregiver will make sure it is from PCOS. At the physical exam, your caregiver will want to evaluate the areas of increased hair growth. Try to allow natural hair growth for a few days before the visit.   During a pelvic exam, the ovaries may be enlarged or swollen by the increased number of small cysts. This can be seen more easily by vaginal ultrasound or screening, to examine the ovaries and lining of the uterus (endometrium) for cysts. The uterine lining may become thicker, if there has not been a regular period.  TREATMENT  Because there is no cure for PCOS, it needs to be managed to prevent problems. Treatments are based on your symptoms. Treatment is also based on whether you want to have a baby or whether you need contraception.  Treatment may include:  Progesterone hormone, to start a menstrual period.   Birth control pills, to make you have regular menstrual periods.   Medicines to make you ovulate, if you want to get pregnant.   Medicines to control your insulin.   Medicine to control your blood pressure.   Medicine and diet, to control your  high cholesterol and triglycerides in your blood.   Surgery, making small holes in the ovary, to decrease the amount of female hormone production. This is done through a long, lighted tube (laparoscope), placed into the pelvis through a tiny incision in the lower abdomen.  Your caregiver will go over some of the choices with  you. WOMEN WITH PCOS HAVE THESE CHARACTERISTICS:  High levels of female hormones called androgens.   An irregular or no menstrual cycle.   May have many small cysts in their ovaries.  PCOS is the most common hormonal reproductive problem in women of childbearing age. WHY DO WOMEN WITH PCOS HAVE TROUBLE WITH THEIR MENSTRUAL CYCLE? Each month, about 20 eggs start to mature in the ovaries. As one egg grows and matures, the follicle breaks open to release the egg, so it can travel through the fallopian tube for fertilization. When the single egg leaves the follicle, ovulation takes place. In women with PCOS, the ovary does not make all of the hormones it needs for any of the eggs to fully mature. They may start to grow and accumulate fluid, but no one egg becomes large enough. Instead, some may remain as cysts. Since no egg matures or is released, ovulation does not occur and the hormone progesterone is not made. Without progesterone, a woman's menstrual cycle is irregular or absent. Also, the cysts produce female hormones, which continue to prevent ovulation.  Document Released: 01/23/2005 Document Revised: 06/11/2011 Document Reviewed: 08/17/2009 [facility] Patient Information 2012 [place], [place].

## 2011-11-06 NOTE — Progress Notes (Signed)
Chief Complaint:  discuss medication   Lori Mcknight is  26 y.o. G0P0.  Patient's last menstrual period was 11/05/2011.Marland Kitchen  Her pregnancy status is negative.  She presents complaining of discuss medication  Pt was under the care of Dr. Okey Dupre for PCOS and infertility. States she has been on 50mg  of Clomid on days 5-9 x 4 months. Reports that she has ovulated each month, confirmed by ovulation kit and has not conceived pregnancy.   States that she desires to stop clomid x 1 month to see if she ovulates on her own.  Obstetrical/Gynecological History: OB History    Grav Para Term Preterm Abortions TAB SAB Ect Mult Living   0               Past Medical History: Past Medical History  Diagnosis Date  . PCOS (polycystic ovarian syndrome)   . Asthma   . Hypertension     had rx from hosp, ran out 2 yrs ago  . Obese   . Urinary tract infection   . Chlamydia   . Genital herpes   . Morbid obesity     Past Surgical History: Past Surgical History  Procedure Date  . Wisdom tooth extraction     Family History: Family History  Problem Relation Age of Onset  . Heart disease Mother   . Diabetes Father   . Heart disease Father   . Heart disease Maternal Grandmother   . Diabetes Maternal Grandfather   . Heart disease Maternal Grandfather     Social History: History  Substance Use Topics  . Smoking status: Former Games developer  . Smokeless tobacco: Never Used   Comment: quit 2009  . Alcohol Use: No    Allergies: No Known Allergies   Review of Systems - Negative except what has been reviewed in the HPI  Physical Exam   Blood pressure 147/83, pulse 74, temperature 97.3 F (36.3 C), temperature source Oral, height 5\' 6"  (1.676 m), weight 340 lb 11.2 oz (154.541 kg), last menstrual period 11/05/2011.  General: General appearance - alert, well appearing, and in no distress, oriented to person, place, and time and morbidly obese Mental status - alert, oriented to person, place, and time,  normal mood, behavior, speech, dress, motor activity, and thought processes, affect appropriate to mood Focused Gynecological Exam: examination not indicated     Assessment: Patient Active Problem List  Diagnoses  . Pelvic pain in female  . PCOS (polycystic ovarian syndrome)  . Obesity  . Infertility, female    Plan: Discussed with patient recommendation for referral to Dr. Clint Bolder at [facility] for infertility management. Also reviewed 13# weight gain over the last 4 months and borderline BP. Highly recommend delaying fertility until in better reproductive health. Recommend weight loss and BP evaluation by PCP prior to pregnancy. Reviewed increased risks to pregnancy related to obesity and hypertension. Recommend daily folic acid. Pt verbalizes understanding, however, desires to come off clomid and see if ovulation occurs. Will RTC prn problems or call for referral to [facility]  Lori Mcknight E. 11/06/2011,1:42 PM

## 2011-12-04 ENCOUNTER — Encounter (HOSPITAL_COMMUNITY): Payer: Self-pay | Admitting: *Deleted

## 2011-12-04 ENCOUNTER — Emergency Department (INDEPENDENT_AMBULATORY_CARE_PROVIDER_SITE_OTHER)
Admission: EM | Admit: 2011-12-04 | Discharge: 2011-12-04 | Disposition: A | Discharge status: Home / Self Care | Payer: Self-pay | Source: Home / Self Care | Attending: Emergency Medicine | Admitting: Emergency Medicine

## 2011-12-04 DIAGNOSIS — J029 Acute pharyngitis, unspecified: Secondary | ICD-10-CM

## 2011-12-04 MED ORDER — GUAIFENESIN-CODEINE 100-10 MG/5ML PO SYRP: 5.0000 mL | ORAL_SOLUTION | Freq: Three times a day (TID) | ORAL | Status: AC | PRN

## 2011-12-04 NOTE — ED Provider Notes (Signed)
History     CSN: [id number]  Arrival date & time 12/04/11  1513   First MD Initiated Contact with Patient 12/04/11 1623      Chief Complaint  Patient presents with  . Sore Throat    (Consider location/radiation/quality/duration/timing/severity/associated sxs/prior treatment) HPI Comments: Patient presents today complaining of a sore throat congestion nose mild cough not consistent, during the last 2 days. Discomfort swallowing has increased. Have had some tactile fevers at home. No shortness of breath, no gastrointestinal symptoms, have been trying with some Advil with some mild relief.  Patient is a 26 y.o. female presenting with pharyngitis. The history is provided by the patient.  Sore Throat This is a new problem. The current episode started more than 2 days ago. The problem occurs constantly. The problem has not changed since onset.Pertinent negatives include no chest pain, no headaches and no shortness of breath. The symptoms are aggravated by swallowing.    Past Medical History  Diagnosis Date  . PCOS (polycystic ovarian syndrome)   . Asthma   . Hypertension     had rx from hosp, ran out 2 yrs ago  . Obese   . Urinary tract infection   . Chlamydia   . Genital herpes   . Morbid obesity     Past Surgical History  Procedure Date  . Wisdom tooth extraction     Family History  Problem Relation Age of Onset  . Heart disease Mother   . Diabetes Father   . Heart disease Father   . Heart disease Maternal Grandmother   . Diabetes Maternal Grandfather   . Heart disease Maternal Grandfather     History  Substance Use Topics  . Smoking status: Former Games developer  . Smokeless tobacco: Never Used   Comment: quit 2009  . Alcohol Use: Yes     occasonal     OB History    Grav Para Term Preterm Abortions TAB SAB Ect Mult Living   0               Review of Systems  Constitutional: Positive for chills and appetite change. Negative for fever.  HENT: Positive for ear  pain, congestion, sore throat, rhinorrhea, postnasal drip and sinus pressure.   Respiratory: Positive for cough. Negative for choking, chest tightness and shortness of breath.   Cardiovascular: Negative for chest pain.  Gastrointestinal: Negative for diarrhea.  Skin: Negative for rash.  Neurological: Negative for headaches.    Allergies  Review of patient's allergies indicates no known allergies.  Home Medications   Current Outpatient Rx  Name Route Sig Dispense Refill  . CLOMIPHENE CITRATE 50 MG PO TABS Oral Take 50 mg by mouth daily.      . GUAIFENESIN-CODEINE 100-10 MG/5ML PO SYRP Oral Take 5 mLs by mouth 3 (three) times daily as needed for cough. 120 mL 0  . HYDROCODONE-ACETAMINOPHEN 5-325 MG PO TABS Oral Take 1 tablet by mouth every 6 (six) hours as needed. pain     . IBUPROFEN 200 MG PO TABS Oral Take 4 tablets (800 mg total) by mouth every 6 (six) hours as needed for pain. 100 tablet 2  . PRENATAL PLUS 27-1 MG PO TABS Oral Take 1 tablet by mouth daily.        BP 121/48  Pulse 76  Temp(Src) 98.2 F (36.8 C) (Oral)  Resp 20  SpO2 100%  LMP 11/05/2011  Physical Exam  Nursing note and vitals reviewed. Constitutional: She appears well-developed. No distress.  HENT:  Head: Normocephalic.  Right Ear: Tympanic membrane normal.  Left Ear: Tympanic membrane normal.  Mouth/Throat: Uvula is midline and mucous membranes are normal.  Eyes: Conjunctivae are normal. No scleral icterus.  Neck: Neck supple. No JVD present.  Cardiovascular: Normal rate.   Pulmonary/Chest: Effort normal. She has no wheezes.  Abdominal: She exhibits no distension.  Lymphadenopathy:    She has no cervical adenopathy.  Skin: No erythema.    ED Course  Procedures (including critical care time)   Labs Reviewed  POCT RAPID STREP A ([facility] CARE ONLY)   No results found.   1. Pharyngitis       MDM  Pharyngitis with coexistent upper respiratory symptoms comfortable afebrile with a negative  strep test and no lymphadenopathies.        Jimmie Molly, MD 12/04/11 2019

## 2011-12-04 NOTE — ED Notes (Signed)
Pt  Has  Symptoms  Of  sorethroat       Stuffy  Nose   Congested  As    Pain on swallowing  Symptoms     X  3  Days    -  Pt  Sitting  Upright on  Exam  Table      Speaking in  Complete  sentances       Skin is  Warm  /  Dry

## 2011-12-04 NOTE — Discharge Instructions (Signed)
Antibiotic Nonuse  Your caregiver felt that the infection or problem was not one that would be helped with an antibiotic. Infections may be caused by viruses or bacteria. Only a caregiver can tell which one of these is the likely cause of an illness. A cold is the most common cause of infection in both adults and children. A cold is a virus. Antibiotic treatment will have no effect on a viral infection. Viruses can lead to many lost days of work caring for sick children and many missed days of school. Children may catch as many as 10 "colds" or "flus" per year during which they can be tearful, cranky, and uncomfortable. The goal of treating a virus is aimed at keeping the ill person comfortable. Antibiotics are medications used to help the body fight bacterial infections. There are relatively few types of bacteria that cause infections but there are hundreds of viruses. While both viruses and bacteria cause infection they are very different types of germs. A viral infection will typically go away by itself within 7 to 10 days. Bacterial infections may spread or get worse without antibiotic treatment. Examples of bacterial infections are:  Sore throats (like strep throat or tonsillitis).   Infection in the lung (pneumonia).   Ear and skin infections.  Examples of viral infections are:  Colds or flus.   Most coughs and bronchitis.   Sore throats not caused by Strep.   Runny noses.  It is often best not to take an antibiotic when a viral infection is the cause of the problem. Antibiotics can kill off the helpful bacteria that we have inside our body and allow harmful bacteria to start growing. Antibiotics can cause side effects such as allergies, nausea, and diarrhea without helping to improve the symptoms of the viral infection. Additionally, repeated uses of antibiotics can cause bacteria inside of our body to become resistant. That resistance can be passed onto harmful bacterial. The next time  you have an infection it may be harder to treat if antibiotics are used when they are not needed. Not treating with antibiotics allows our own immune system to develop and take care of infections more efficiently. Also, antibiotics will work better for [place] when they are prescribed for bacterial infections. Treatments for a child that is ill may include:  Give extra fluids throughout the day to stay hydrated.   Get plenty of rest.   Only give your child over-the-counter or prescription medicines for pain, discomfort, or fever as directed by your caregiver.   The use of a cool mist humidifier may help stuffy noses.   Cold medications if suggested by your caregiver.  Your caregiver may decide to start you on an antibiotic if:  The problem you were seen for today continues for a longer length of time than expected.   You develop a secondary bacterial infection.  SEEK MEDICAL CARE IF:  Fever lasts longer than 5 days.   Symptoms continue to get worse after 5 to 7 days or become severe.   Difficulty in breathing develops.   Signs of dehydration develop (poor drinking, rare urinating, dark colored urine).   Changes in behavior or worsening tiredness (listlessness or lethargy).  Document Released: 12/08/2001 Document Revised: 06/11/2011 Document Reviewed: 06/06/2009 [facility] Patient Information 2012 [place], [place].Sore Throat Sore throats may be caused by bacteria and viruses. They may also be caused by:  Smoking.   Pollution.   Allergies.  If a sore throat is due to strep infection (a bacterial infection),  you may need:  A throat swab.   A culture test to verify the strep infection.  You will need one of these:  An antibiotic shot.   Oral medicine for a full 10 days.  Strep infection is very contagious. A doctor should check any close contacts who have a sore throat or fever. A sore throat caused by a virus infection will usually last only 3-4 days. Antibiotics will not treat a  viral sore throat.  Infectious mononucleosis (a viral disease), however, can cause a sore throat that lasts for up to 3 weeks. Mononucleosis can be diagnosed with blood tests. You must have been sick for at least 1 week in order for the test to give accurate results. HOME CARE INSTRUCTIONS   To treat a sore throat, take mild pain medicine.   Increase your fluids.   Eat a soft diet.   Do not smoke.   Gargling with warm water or salt water (1 tsp. salt in 8 oz. water) can be helpful.   Try throat sprays or lozenges or sucking on hard candy to ease the symptoms.  Call your doctor if your sore throat lasts longer than 1 week.  SEEK IMMEDIATE MEDICAL CARE IF:  You have difficulty breathing.   You have increased swelling in the throat.   You have pain so severe that you are unable to swallow fluids or your saliva.   You have a severe headache, a high fever, vomiting, or a red rash.  Document Released: 11/06/2004 Document Revised: 06/11/2011 Document Reviewed: 09/16/2007 [facility] Patient Information 2012 [place], [place].

## 2012-01-14 ENCOUNTER — Encounter: Payer: Self-pay | Admitting: Obstetrics and Gynecology

## 2012-01-14 ENCOUNTER — Ambulatory Visit (INDEPENDENT_AMBULATORY_CARE_PROVIDER_SITE_OTHER): Payer: Self-pay | Admitting: Obstetrics and Gynecology

## 2012-01-14 VITALS — BP 138/91 | HR 77 | Temp 98.4°F | Ht 66.0 in | Wt 340.5 lb

## 2012-01-14 DIAGNOSIS — N926 Irregular menstruation, unspecified: Secondary | ICD-10-CM

## 2012-01-14 DIAGNOSIS — N910 Primary amenorrhea: Secondary | ICD-10-CM

## 2012-01-14 DIAGNOSIS — N979 Female infertility, unspecified: Secondary | ICD-10-CM

## 2012-01-14 DIAGNOSIS — N939 Abnormal uterine and vaginal bleeding, unspecified: Secondary | ICD-10-CM

## 2012-01-14 MED ORDER — MEDROXYPROGESTERONE ACETATE 10 MG PO TABS: 10.0000 mg | ORAL_TABLET | Freq: Every day | ORAL | Status: DC

## 2012-01-14 NOTE — Progress Notes (Signed)
Addended by: Harmon Pier III on: 01/14/2012 03:52 PM   Modules accepted: Orders

## 2012-01-14 NOTE — Progress Notes (Signed)
S: Pt presents today c/o no menses. She has been treated for infertility with Clomid and was ovulating according to home ovulation tests. At her last visit she stopped the clomid because she wanted to see if she could ovulate on her on. Since that time she has not ovulated and has not had a period. She has had some pain like she was going to start her menses. She denies fever, vag irritation, or any other problems at this time. O: VSS A&O x 3 in NAD Pt morbidly obese Abd soft, non-tender to palpation. NL external genitalia. Bimanual exam difficult secondary to increased body habitus. No obvious adnexal masses. Pt non-tender on exam. Urine preg test negative A/P: Delayed menses: discussed with pt at length. Will give Rx for provera 10mg  daily x 10 days. She will f/u with the infertility specialist at [facility]. Discussed diet, activity, risks, and precautions.  Clinton Gallant. Kimarie Coor III, DrHSc, MPAS, PA-C

## 2012-01-14 NOTE — Progress Notes (Signed)
Called [facility] medicine(Dr. Tania Ade) to schedule appointment, patient needs to pay $225 upfront and then they will schedule appointment- gave patient phone number/address/information needed. Also need to send records. Patient voices understanding.

## 2012-01-26 ENCOUNTER — Telehealth: Payer: Self-pay | Admitting: *Deleted

## 2012-01-26 NOTE — Telephone Encounter (Signed)
Pt left message stating that she has missed 2 days of her 10 day course of provera (has only taken 6 tabs).  She has started spotting and wants to know if she should resume the medication now or just let her period start. I called pt and instructed her not to resume the medication. I further explained that it is good that she is having some spotting. She may get a full period or just continue with the spotting for a few days. Pt states that she did take a pill today. I told her not to continue because it is confusing to the body to increase and decrease hormones so quickly. Pt voiced understanding.

## 2012-02-05 ENCOUNTER — Telehealth: Payer: Self-pay | Admitting: *Deleted

## 2012-02-05 NOTE — Telephone Encounter (Signed)
Pt left message stating that she is having very heavy bleeding. She had been prescribed provera which she has finished now. She has been bleeding for 8-9 days, passing clots and having pelvic pain.

## 2012-02-05 NOTE — Telephone Encounter (Signed)
I returned pt call and discussed her concerns.  She reports that her bleeding has been light yesterday and today but she continues to have sharp intermittent pain in her rectum and abdomen.  Also, she has been passing clots that are the size of a nickel.   I told her that what she is experiencing is wnl during a heavy period.  It is good that her bleeding has slowed down. I advised taking ibuprofen 800 mg w/food every 8 hrs consistently for the next 24-48 hrs, even if her pain is not severe.  She should go to [facility] if her pain becomes worse or if the bleeding is very heavy again. She may call back if she has any further questions or problems. She has no further clinic appts @ this time and states she has been referred to infertility specialist. Pt voiced understanding.

## 2012-04-19 ENCOUNTER — Telehealth: Payer: Self-pay | Admitting: *Deleted

## 2012-04-19 NOTE — Telephone Encounter (Signed)
Will route to [place] for her approval or denial as she has seen patient most recently

## 2012-04-19 NOTE — Telephone Encounter (Signed)
[place] called and left a message stating could my doctor order me a medicine called Famera to jump start my period. States I had been taking provera took it last in April and last period 01/29/12. I heard of a medicine named Famera- I would like to take it. I don't like taking provera, please call me.

## 2012-04-19 NOTE — Telephone Encounter (Signed)
Called Lori Mcknight and informed her she would need to  Make an appointment to discuss changing meds- transferred to appt desk to make appt. Pt. Voices understanding

## 2012-04-19 NOTE — Telephone Encounter (Signed)
Pt needs fu appt in clinic to discuss

## 2012-04-21 ENCOUNTER — Ambulatory Visit: Payer: Self-pay | Admitting: Family

## 2012-07-19 ENCOUNTER — Emergency Department (INDEPENDENT_AMBULATORY_CARE_PROVIDER_SITE_OTHER)
Admission: EM | Admit: 2012-07-19 | Discharge: 2012-07-19 | Disposition: A | Discharge status: Home / Self Care | Payer: Self-pay | Source: Home / Self Care | Attending: Family Medicine | Admitting: Family Medicine

## 2012-07-19 ENCOUNTER — Encounter (HOSPITAL_COMMUNITY): Payer: Self-pay

## 2012-07-19 DIAGNOSIS — J029 Acute pharyngitis, unspecified: Secondary | ICD-10-CM

## 2012-07-19 MED ORDER — AZITHROMYCIN 250 MG PO TABS: ORAL_TABLET | ORAL | Status: DC

## 2012-07-19 NOTE — ED Provider Notes (Signed)
History     CSN: [id number]  Arrival date & time 07/19/12  1514   First MD Initiated Contact with Patient 07/19/12 1526      Chief Complaint  Patient presents with  . Sore Throat    (Consider location/radiation/quality/duration/timing/severity/associated sxs/prior treatment) Patient is a 26 y.o. female presenting with pharyngitis. The history is provided by the patient.  Sore Throat This is a new problem. The current episode started more than 2 days ago. The problem occurs constantly. The problem has been gradually worsening. The symptoms are aggravated by swallowing.    Past Medical History  Diagnosis Date  . PCOS (polycystic ovarian syndrome)   . Asthma   . Hypertension     had rx from hosp, ran out 2 yrs ago  . Obese   . Urinary tract infection   . Chlamydia   . Genital herpes   . Morbid obesity     Past Surgical History  Procedure Date  . Wisdom tooth extraction     Family History  Problem Relation Age of Onset  . Heart disease Mother   . Diabetes Father   . Heart disease Father   . Heart disease Maternal Grandmother   . Diabetes Maternal Grandfather   . Heart disease Maternal Grandfather     History  Substance Use Topics  . Smoking status: Former Games developer  . Smokeless tobacco: Never Used   Comment: quit 2009  . Alcohol Use: Yes     occasonal     OB History    Grav Para Term Preterm Abortions TAB SAB Ect Mult Living   0               Review of Systems  Constitutional: Negative.   HENT: Positive for sore throat. Negative for congestion, rhinorrhea and postnasal drip.     Allergies  Review of patient's allergies indicates no known allergies.  Home Medications   Current Outpatient Rx  Name Route Sig Dispense Refill  . ALBUTEROL IN Inhalation Inhale into the lungs 2 (two) times daily.    . AZITHROMYCIN 250 MG PO TABS  Take as directed on pack 6 each 0  . CLOMIPHENE CITRATE 50 MG PO TABS Oral Take 50 mg by mouth daily.      Marland Kitchen  HYDROCODONE-ACETAMINOPHEN 5-325 MG PO TABS Oral Take 1 tablet by mouth every 6 (six) hours as needed. pain     . IBUPROFEN 200 MG PO TABS Oral Take 4 tablets (800 mg total) by mouth every 6 (six) hours as needed for pain. 100 tablet 2  . MEDROXYPROGESTERONE ACETATE 10 MG PO TABS Oral Take 1 tablet (10 mg total) by mouth daily. 10 tablet 0  . PRENATAL PLUS 27-1 MG PO TABS Oral Take 1 tablet by mouth daily.        BP 155/88  Pulse 78  Temp 98.8 F (37.1 C) (Oral)  Resp 20  SpO2 97%  LMP 07/17/2012  Physical Exam  Nursing note and vitals reviewed. Constitutional: She is oriented to person, place, and time. She appears well-developed and well-nourished.  HENT:  Head: Normocephalic.  Right Ear: External ear normal.  Mouth/Throat: Mucous membranes are normal. Oropharyngeal exudate present. No posterior oropharyngeal edema or posterior oropharyngeal erythema.  Eyes: Pupils are equal, round, and reactive to light.  Neck: Normal range of motion. Neck supple.  Lymphadenopathy:    She has cervical adenopathy.  Neurological: She is alert and oriented to person, place, and time.  Skin: Skin is warm and dry.  ED Course  Procedures (including critical care time)   Labs Reviewed  POCT RAPID STREP A ([facility]RG CARE ONLY)   No results found.   1. Pharyngitis, acute       MDM  Strep  Neg.        Linna Hoff, MD 07/19/12 [phone]

## 2012-07-19 NOTE — ED Notes (Signed)
C/o sore throat x 5 days , no other sx and no fevers

## 2012-11-01 ENCOUNTER — Ambulatory Visit: Payer: Self-pay | Admitting: Obstetrics and Gynecology

## 2012-11-11 ENCOUNTER — Ambulatory Visit (INDEPENDENT_AMBULATORY_CARE_PROVIDER_SITE_OTHER): Payer: Self-pay | Admitting: Advanced Practice Midwife

## 2012-11-11 ENCOUNTER — Encounter: Payer: Self-pay | Admitting: Advanced Practice Midwife

## 2012-11-11 VITALS — BP 130/80 | HR 100 | Temp 99.8°F | Ht 66.0 in | Wt 329.8 lb

## 2012-11-11 DIAGNOSIS — N921 Excessive and frequent menstruation with irregular cycle: Secondary | ICD-10-CM

## 2012-11-11 DIAGNOSIS — Z124 Encounter for screening for malignant neoplasm of cervix: Secondary | ICD-10-CM

## 2012-11-11 DIAGNOSIS — Z202 Contact with and (suspected) exposure to infections with a predominantly sexual mode of transmission: Secondary | ICD-10-CM

## 2012-11-11 DIAGNOSIS — Z2089 Contact with and (suspected) exposure to other communicable diseases: Secondary | ICD-10-CM

## 2012-11-11 DIAGNOSIS — N920 Excessive and frequent menstruation with regular cycle: Secondary | ICD-10-CM

## 2012-11-11 DIAGNOSIS — A5901 Trichomonal vulvovaginitis: Secondary | ICD-10-CM | POA: Insufficient documentation

## 2012-11-11 LAB — CBC
HCT: 34.9 % — ABNORMAL LOW (ref 36.0–46.0)
Hemoglobin: 11.4 g/dL — ABNORMAL LOW (ref 12.0–15.0)
MCH: 29.3 pg (ref 26.0–34.0)
MCHC: 32.7 g/dL (ref 30.0–36.0)
MCV: 89.7 fL (ref 78.0–100.0)

## 2012-11-11 MED ORDER — MEDROXYPROGESTERONE ACETATE 10 MG PO TABS: 10.0000 mg | ORAL_TABLET | Freq: Every day | ORAL | Status: DC

## 2012-11-11 MED ORDER — CLOMIPHENE CITRATE 50 MG PO TABS: 50.0000 mg | ORAL_TABLET | Freq: Every day | ORAL | Status: DC

## 2012-11-11 NOTE — Progress Notes (Signed)
  Subjective:    Patient ID: Lori Mcknight, female    DOB: 08-13-1986, 27 y.o.   MRN: [id number]  HPI: Lori Mcknight is here for prolonged intermittently heavy vaginal bleeding w/ clots since 05/31/12, moderate cramping when passing clots. Would like to have Pap done. Has had very irregular cycles since menarche and has previously been Dx w/ PCOS. Took 4 cycles of clomid end of 2012-beginning of 2013. Had normal cycles and ovulated per ovulation predictor kit during that time, but did not conceive. Stopped Clomid 10/2011. No cycle x~3 months. Was seen at clinic, Rx'd Provera. Bleeding stopped, Mcknight withdrawal bleed, scant spotting x 2-3 months followed by bleeding ever since 05/2012. TSH normal 2012.    Review of Systems: Denies dizziness, tachycardia, Hx of other heavy bleeding, unexplained changes in weight or energy level, dyspareunia, vaginal discharge.     Objective:   Physical ExamBP 130/80  Pulse 100  Temp 99.8 F (37.7 C)  Ht 5\' 6"  (1.676 m)  Wt 329 lb 12.8 oz (149.596 kg)  BMI 53.23 kg/m2  LMP 05/31/2012 General appearance: alert, cooperative, morbidly obese and normal color for race. Neck: thyroid not enlarged, symmetric, no tenderness/mass/nodules Lungs: clear to auscultation bilaterally Heart: regular rate and rhythm, S1, S2 normal, no murmur, click, rub or gallop Abdomen: soft, non-tender; bowel sounds normal; no masses,  no organomegaly Pelvic: cervix normal in appearance, exam obscured by obesity, external genitalia normal, no adnexal masses or tenderness, no cervical motion tenderness and small amount of DRB in vault. No cervical polyps.  Neurologic: Alert and oriented X 3, normal strength and tone. Normal symmetric reflexes. Normal coordination and gait     Assessment & Plan:   1. Encounter for screening for malignant neoplasm of cervix  Cytology - PAP  2. Exposure to STD  Cytology - PAP  3. Menometrorrhagia  US Pelvis Complete, US Transvaginal Non-OB, CBC, clomiPHENE (CLOMID)  50 MG tablet, medroxyPROGESTERone (PROVERA) 10 MG tablet   Clomid Rx'd per consult w/ Lori Mcknight. Since this has effectively regulated her cycles in the past w/out inhibiting fertility OK per Lori Mcknight to Rx w/out charging pt infertility fee. WILL ONLY Rx FOR 6 CYCLES. Pt informed and verbalized understanding.   Provera first, then start Clomid on day 5 of withdrawal bleed. Take days 5-9 of each cycles x 3  cycles. F/U in clinic to re-eval bleeding/cycles. May increase Clomid to 100 mg.   [place], [place] 11/11/2012 3:48 PM

## 2012-11-11 NOTE — Patient Instructions (Signed)

## 2012-11-11 NOTE — Progress Notes (Signed)
States was on clomid last year when trying to get pregnant, after stopped had one period then stopped having periods, then a period started in August and since then has been bleeding everyday - sometimes light, sometimes heavy, sometimes clots

## 2012-11-12 ENCOUNTER — Ambulatory Visit (HOSPITAL_COMMUNITY)
Admission: RE | Admit: 2012-11-12 | Discharge: 2012-11-12 | Disposition: A | Discharge status: Home / Self Care | Payer: Self-pay | Source: Ambulatory Visit | Attending: Advanced Practice Midwife | Admitting: Advanced Practice Midwife

## 2012-11-12 DIAGNOSIS — N921 Excessive and frequent menstruation with irregular cycle: Secondary | ICD-10-CM

## 2012-11-12 DIAGNOSIS — N920 Excessive and frequent menstruation with regular cycle: Secondary | ICD-10-CM | POA: Insufficient documentation

## 2012-11-12 DIAGNOSIS — E282 Polycystic ovarian syndrome: Secondary | ICD-10-CM | POA: Insufficient documentation

## 2012-11-15 ENCOUNTER — Other Ambulatory Visit: Payer: Self-pay | Admitting: Advanced Practice Midwife

## 2012-11-15 DIAGNOSIS — A5901 Trichomonal vulvovaginitis: Secondary | ICD-10-CM

## 2012-11-15 MED ORDER — METRONIDAZOLE 500 MG PO TABS: 2000.0000 mg | ORAL_TABLET | Freq: Once | ORAL | Status: DC

## 2012-11-15 NOTE — Progress Notes (Signed)
Please inform pt of Dx Trichomonas. Rx Flagyl. Pap GC/CT normal. Partner needs Tx. No IC until 1 week after both Tx. Offer RPR, HIV, Hep B and Hep C. Testing in clinic.

## 2012-11-16 ENCOUNTER — Telehealth: Payer: Self-pay

## 2012-11-16 NOTE — Progress Notes (Signed)
Called patient back and informed her of normal ultrasound results and that she was positive for Trich from her most recent pap smear, which is an STD and its very important to get treated for this and that we have already called in Flagyl to her [facility] pharmacy to treat this. Also told patient that it is very important for her partner to be treated as well and to avoid intercourse for 1 week until after they are both treated. Patient verbalized understanding. Also offered RPR, HIV, and Heb B and Heb C testing to patient as well- patient seemed disinterested but verbalized understanding. Patient stated that she is taking clomid and that her bleeding is heavier now and wants to know if this is normal. Spoke to [employer] about this and she stated that clomid doesn't cause extra bleeding but that the bleeding could be coming from her trich. Told patient of these instructions. Patient verbalized understanding and had no further questions

## 2012-11-16 NOTE — Telephone Encounter (Signed)
Message copied by Faythe Casa on Tue Nov 16, 2012 11:58 AM ------      Message from: Dorathy Kinsman      Created: Mon Nov 15, 2012 10:32 PM       Inform pt normal [place].

## 2012-11-17 ENCOUNTER — Telehealth: Payer: Self-pay | Admitting: General Practice

## 2012-11-17 ENCOUNTER — Other Ambulatory Visit: Payer: Self-pay | Admitting: Advanced Practice Midwife

## 2012-11-17 DIAGNOSIS — A599 Trichomoniasis, unspecified: Secondary | ICD-10-CM

## 2012-11-17 DIAGNOSIS — A5901 Trichomonal vulvovaginitis: Secondary | ICD-10-CM

## 2012-11-17 MED ORDER — METRONIDAZOLE 500 MG PO TABS: 2000.0000 mg | ORAL_TABLET | Freq: Once | ORAL | Status: DC

## 2012-11-17 NOTE — Telephone Encounter (Signed)
Pt called and left message stating she went to her pharmacy but the medication wasn't there. Called patient and informed her that I reordered the medicine and that it should be at her pharmacy now. Patient verbalized understanding and had no further questions

## 2012-11-24 ENCOUNTER — Telehealth: Payer: Self-pay

## 2012-11-24 DIAGNOSIS — A5901 Trichomonal vulvovaginitis: Secondary | ICD-10-CM

## 2012-11-24 MED ORDER — METRONIDAZOLE 500 MG PO TABS: 2000.0000 mg | ORAL_TABLET | Freq: Once | ORAL | Status: AC

## 2012-11-24 NOTE — Telephone Encounter (Signed)
Pt called and stated that she had lost her medication for flagyl.  I informed pt that I would e-prescribe another refill for her to please take 4 tablets all at once and to please refrain from intercourse for couple weeks.  Make sure that her partner(s) are treated as well.  Pt stated understanding and did not have any further questions.

## 2013-04-18 ENCOUNTER — Inpatient Hospital Stay (HOSPITAL_COMMUNITY)
Admission: AD | Admit: 2013-04-18 | Discharge: 2013-04-18 | Disposition: A | Discharge status: Home / Self Care | Payer: Self-pay | Source: Ambulatory Visit | Attending: Obstetrics & Gynecology | Admitting: Obstetrics & Gynecology

## 2013-04-18 ENCOUNTER — Inpatient Hospital Stay (HOSPITAL_COMMUNITY): Payer: Self-pay

## 2013-04-18 ENCOUNTER — Encounter (HOSPITAL_COMMUNITY): Payer: Self-pay | Admitting: *Deleted

## 2013-04-18 DIAGNOSIS — N949 Unspecified condition associated with female genital organs and menstrual cycle: Secondary | ICD-10-CM | POA: Insufficient documentation

## 2013-04-18 DIAGNOSIS — N760 Acute vaginitis: Secondary | ICD-10-CM | POA: Insufficient documentation

## 2013-04-18 DIAGNOSIS — A499 Bacterial infection, unspecified: Secondary | ICD-10-CM | POA: Insufficient documentation

## 2013-04-18 DIAGNOSIS — R102 Pelvic and perineal pain: Secondary | ICD-10-CM

## 2013-04-18 DIAGNOSIS — E282 Polycystic ovarian syndrome: Secondary | ICD-10-CM

## 2013-04-18 DIAGNOSIS — B9689 Other specified bacterial agents as the cause of diseases classified elsewhere: Secondary | ICD-10-CM | POA: Insufficient documentation

## 2013-04-18 LAB — CBC
Hemoglobin: 11.3 g/dL — ABNORMAL LOW (ref 12.0–15.0)
MCH: 24.6 pg — ABNORMAL LOW (ref 26.0–34.0)
MCV: 78.6 fL (ref 78.0–100.0)
RBC: 4.59 MIL/uL (ref 3.87–5.11)

## 2013-04-18 LAB — URINALYSIS, ROUTINE W REFLEX MICROSCOPIC
Bilirubin Urine: NEGATIVE
Glucose, UA: NEGATIVE mg/dL
Ketones, ur: NEGATIVE mg/dL
pH: 7 (ref 5.0–8.0)

## 2013-04-18 LAB — WET PREP, GENITAL
Trich, Wet Prep: NONE SEEN
WBC, Wet Prep HPF POC: NONE SEEN
Yeast Wet Prep HPF POC: NONE SEEN

## 2013-04-18 LAB — POCT PREGNANCY, URINE: Preg Test, Ur: NEGATIVE

## 2013-04-18 MED ORDER — KETOROLAC TROMETHAMINE 30 MG/ML IJ SOLN
30.0000 mg | Freq: Once | INTRAMUSCULAR | Status: AC
  Administered 2013-04-18: 30 mg via INTRAVENOUS
  Filled 2013-04-18: qty 1

## 2013-04-18 MED ORDER — METRONIDAZOLE 500 MG PO TABS: 500.0000 mg | ORAL_TABLET | Freq: Two times a day (BID) | ORAL | Status: DC

## 2013-04-18 MED ORDER — IBUPROFEN 600 MG PO TABS: 600.0000 mg | ORAL_TABLET | Freq: Four times a day (QID) | ORAL | Status: DC | PRN

## 2013-04-18 NOTE — MAU Provider Note (Signed)
History     CSN: [id number]  Arrival date and time: 04/18/13 1335   None     No chief complaint on file.  HPI This is a 27 y.o. female who presents with c/o intermittent pain that moves from side to side and sometimes across middle of pelvis. Periods are irregular due to PCOS. Thinks she might be pregnant. No fever. No N/V/D.  RN Note: No period since March. Hx of irreg cycles and PCOS. Did a test on 07/03- neg; 07/04 Faint pos, 07/05 Neg. Light pink spotting 06/28, Brownish on the 29. Started having a lot of pain in lower abd (ovaries) on Sunday. Breast are very sore past 3 days. Having a lot of pressure when she pees  OB History   Grav Para Term Preterm Abortions TAB SAB Ect Mult Living   0               Past Medical History  Diagnosis Date  . PCOS (polycystic ovarian syndrome)   . Asthma   . Hypertension     had rx from hosp, ran out 2 yrs ago  . Obese   . Urinary tract infection   . Chlamydia   . Genital herpes   . Morbid obesity     Past Surgical History  Procedure Laterality Date  . Wisdom tooth extraction      Family History  Problem Relation Age of Onset  . Heart disease Mother   . Diabetes Father   . Heart disease Father   . Heart disease Maternal Grandmother   . Diabetes Maternal Grandfather   . Heart disease Maternal Grandfather   . Sleep apnea Mother     History  Substance Use Topics  . Smoking status: Former Games developer  . Smokeless tobacco: Never Used     Comment: quit 2009  . Alcohol Use: Yes     Comment: occasonal     Allergies: No Known Allergies  Prescriptions prior to admission  Medication Sig Dispense Refill  . albuterol (PROVENTIL HFA;VENTOLIN HFA) 108 (90 BASE) MCG/ACT inhaler Inhale 2 puffs into the lungs every 6 (six) hours as needed (asthma).      . ibuprofen (ADVIL,MOTRIN) 200 MG tablet Take 600 mg by mouth every 6 (six) hours as needed for pain.         Review of Systems  Constitutional: Negative for fever, chills and  malaise/fatigue.  Gastrointestinal: Positive for abdominal pain. Negative for nausea, vomiting, diarrhea and constipation.  Genitourinary: Positive for dysuria.  Musculoskeletal: Negative for myalgias.  Neurological: Negative for dizziness and headaches.   Physical Exam   Blood pressure 145/78, pulse 83, temperature 98.1 F (36.7 C), temperature source Oral, resp. rate 20, height 5' 3.5" (1.613 m), weight 151.501 kg (334 lb), last menstrual period 12/13/2012.  Physical Exam  Constitutional: She is oriented to person, place, and time. She appears well-developed and well-nourished. No distress.  HENT:  Head: Normocephalic.  Cardiovascular: Normal rate.   Respiratory: Effort normal.  GI: Soft. She exhibits no distension. There is no tenderness. There is no rebound and no guarding.  Genitourinary: Uterus normal. Vaginal discharge (thick white discharge) found.  Cervix long No CMT No focal pelvic tenderness Difficult to feel pelvic organs due to habitus  Musculoskeletal: Normal range of motion.  Neurological: She is alert and oriented to person, place, and time.  Skin: Skin is warm and dry.  Psychiatric: She has a normal mood and affect.    MAU Course  Procedures  MDM Cultures and  wet prep done per pt request. Results for orders placed during the hospital encounter of 04/18/13 (from the past 24 hour(s))  HCG, QUANTITATIVE, PREGNANCY     Status: None   Collection Time    04/18/13  2:20 PM      Result Value Range   hCG, Beta Chain, Quant, S <1  <5 mIU/mL  CBC     Status: Abnormal   Collection Time    04/18/13  2:20 PM      Result Value Range   WBC 7.0  4.0 - 10.5 K/uL   RBC 4.59  3.87 - 5.11 MIL/uL   Hemoglobin 11.3 (*) 12.0 - 15.0 g/dL   HCT 40.9  81.1 - 91.4 %   MCV 78.6  78.0 - 100.0 fL   MCH 24.6 (*) 26.0 - 34.0 pg   MCHC 31.3  30.0 - 36.0 g/dL   RDW 78.2 (*) 95.6 - 21.3 %   Platelets 259  150 - 400 K/uL  URINALYSIS, ROUTINE W REFLEX MICROSCOPIC     Status: None    Collection Time    04/18/13  2:43 PM      Result Value Range   Color, Urine YELLOW  YELLOW   APPearance CLEAR  CLEAR   Specific Gravity, Urine 1.015  1.005 - 1.030   pH 7.0  5.0 - 8.0   Glucose, UA NEGATIVE  NEGATIVE mg/dL   Hgb urine dipstick NEGATIVE  NEGATIVE   Bilirubin Urine NEGATIVE  NEGATIVE   Ketones, ur NEGATIVE  NEGATIVE mg/dL   Protein, ur NEGATIVE  NEGATIVE mg/dL   Urobilinogen, UA 0.2  0.0 - 1.0 mg/dL   Nitrite NEGATIVE  NEGATIVE   Leukocytes, UA NEGATIVE  NEGATIVE  POCT PREGNANCY, URINE     Status: None   Collection Time    04/18/13  2:47 PM      Result Value Range   Preg Test, Ur NEGATIVE  NEGATIVE  WET PREP, GENITAL     Status: Abnormal   Collection Time    04/18/13  3:43 PM      Result Value Range   Yeast Wet Prep HPF POC NONE SEEN  NONE SEEN   Trich, Wet Prep NONE SEEN  NONE SEEN   Clue Cells Wet Prep HPF POC MODERATE (*) NONE SEEN   WBC, Wet Prep HPF POC NONE SEEN  NONE SEEN   US Pelvis Complete  04/18/2013   *RADIOLOGY REPORT*  Clinical Data: Pelvic pain.  History of polycystic ovary syndrome. History of obesity.  LMP 12/13/2012.  TRANSABDOMINAL AND TRANSVAGINAL ULTRASOUND OF PELVIS Technique:  Both transabdominal and transvaginal ultrasound examinations of the pelvis were performed. Transabdominal technique was performed for global imaging of the pelvis including uterus, ovaries, adnexal regions, and pelvic cul-de-sac.  It was necessary to proceed with endovaginal exam following the transabdominal exam to visualize the endometrium, adnexal regions.  Comparison:  11/12/2012  Findings:  Uterus: Retroflexed, 8.9 x 5.0 x 4.8 cm.  No mass.  Endometrium: 15 mm, homogeneous.  Right ovary:  3.5 x 1.8 x 2.2 cm.Normal appearance.  Left ovary: 3.3 x 1.6 x 2.5 cm.  Normal appearance.  Other findings: Trace free pelvic fluid  IMPRESSION:  1.  Retroflexed uterus. 2.  The endometrial thickness is within normal limits for premenopausal asymptomatic patient. 3.  Normal appearance of  the ovaries.  No adnexal mass.   Original Report Authenticated By: Norva Pavlov, M.D.   Assessment and Plan  A:  Pelvic pain, possibly menses coming on  Bacterial Vaginosis  P:  Discharge      Rx Flagyl      Rx Motrin       Has appt in GYN clinic this month   [facility] 04/18/2013, 3:43 PM

## 2013-04-18 NOTE — MAU Note (Addendum)
No period since March.  Hx of irreg cycles and PCOS.    Did a test on 07/03- neg; 07/04  Faint pos, 07/05  Neg.  Light pink spotting 06/28,  Brownish on the 29.  Started having a lot of pain in lower abd  (ovaries) on Sunday.  Breast are very sore past 3 days.   Having a lot of pressure when she pees.

## 2013-04-19 LAB — GC/CHLAMYDIA PROBE AMP: GC Probe RNA: NEGATIVE

## 2013-04-20 NOTE — MAU Provider Note (Signed)
Attestation of Attending Supervision of Advanced Practitioner (PA/CNM/NP): Evaluation and management procedures were performed by the Advanced Practitioner under my supervision and collaboration.  I have reviewed the Advanced Practitioner's note and chart, and I agree with the management and plan.  Delmore Sear, MD, FACOG Attending Obstetrician & Gynecologist Faculty Practice, [facility]  

## 2013-04-26 ENCOUNTER — Telehealth: Payer: Self-pay | Admitting: *Deleted

## 2013-04-26 DIAGNOSIS — B379 Candidiasis, unspecified: Secondary | ICD-10-CM

## 2013-04-26 MED ORDER — FLUCONAZOLE 150 MG PO TABS: 150.0000 mg | ORAL_TABLET | Freq: Once | ORAL | Status: DC

## 2013-04-26 NOTE — Telephone Encounter (Signed)
Patient left message that her antibiotic has given her a yeast infection. Per protocol rx sent in for diflucan.

## 2013-05-12 ENCOUNTER — Encounter: Payer: Self-pay | Admitting: Obstetrics and Gynecology

## 2013-05-12 ENCOUNTER — Ambulatory Visit (INDEPENDENT_AMBULATORY_CARE_PROVIDER_SITE_OTHER): Payer: Self-pay | Admitting: Obstetrics and Gynecology

## 2013-05-12 VITALS — BP 142/91 | HR 90 | Temp 97.4°F | Ht 66.0 in | Wt 326.1 lb

## 2013-05-12 DIAGNOSIS — N949 Unspecified condition associated with female genital organs and menstrual cycle: Secondary | ICD-10-CM

## 2013-05-12 DIAGNOSIS — E282 Polycystic ovarian syndrome: Secondary | ICD-10-CM

## 2013-05-12 DIAGNOSIS — N921 Excessive and frequent menstruation with irregular cycle: Secondary | ICD-10-CM

## 2013-05-12 DIAGNOSIS — N920 Excessive and frequent menstruation with regular cycle: Secondary | ICD-10-CM

## 2013-05-12 DIAGNOSIS — R102 Pelvic and perineal pain: Secondary | ICD-10-CM

## 2013-05-12 MED ORDER — MEDROXYPROGESTERONE ACETATE 10 MG PO TABS: 10.0000 mg | ORAL_TABLET | Freq: Every day | ORAL | Status: DC

## 2013-05-12 MED ORDER — CLOMIPHENE CITRATE 50 MG PO TABS: 50.0000 mg | ORAL_TABLET | Freq: Every day | ORAL | Status: DC

## 2013-05-12 NOTE — Progress Notes (Signed)
  Subjective:    Patient ID: Lori Mcknight, female    DOB: Jul 27, 1986, 27 y.o.   MRN: [id number]  HPI 27 yo G0 with LMP 7/26 presenting today as a follow up for PCOS. Patient was last seen in 10/2012 and prescribed clomid and provera to help regulate cycles. Patient states that she took the clomid once and had a normal cycles and had been regular on her own up until April when amenorrhea returned. Patient did not take provera or Clomid up until July as she thought her cycles would regulate themselves again. Patient also reports dysmenorrhea  Past Medical History  Diagnosis Date  . PCOS (polycystic ovarian syndrome)   . Asthma   . Hypertension     had rx from hosp, ran out 2 yrs ago  . Obese   . Urinary tract infection   . Chlamydia   . Genital herpes   . Morbid obesity    Past Surgical History  Procedure Laterality Date  . Wisdom tooth extraction     Family History  Problem Relation Age of Onset  . Heart disease Mother   . Diabetes Father   . Heart disease Father   . Heart disease Maternal Grandmother   . Diabetes Maternal Grandfather   . Heart disease Maternal Grandfather   . Sleep apnea Mother    History  Substance Use Topics  . Smoking status: Former Games developer  . Smokeless tobacco: Never Used     Comment: quit 2009  . Alcohol Use: Yes     Comment: occasonal       Review of Systems  All other systems reviewed and are negative.       Objective:   Physical Exam  Declined by patient as she is on her cycle      Assessment & Plan:  27 yo with amenorrhea secondary to PCOS - Educated patient again on importance of regulating cycles in order to avoid hyperplasia - Patient actively seeking pregnancy and does not want contraception - Educated patient on how to take provera and clomid to achieve pregnancy and regulate cycles - Patient to continue with clomid but if unable to conceive when prescription is completed should be referred to infertility specialist. - RTC for  annual exam

## 2013-05-16 ENCOUNTER — Telehealth: Payer: Self-pay | Admitting: Obstetrics and Gynecology

## 2013-05-16 NOTE — Telephone Encounter (Signed)
Patient called left message with c/o vaginal bleed and concern if this is a side effect of clomid that she started taking. Tried to call patient back; no answer-- left message that we're returning her call and that we'll try again or she can call us back soon as she can.

## 2013-05-17 NOTE — Telephone Encounter (Signed)
Spoke w/pt regarding her concerns. She states she started her period on 7/27 after taking 10 days of Provera. She then took 5 days of Clomid starting on 7/31 as she was instructed. She is concerned because she has not stopped bleeding from the period which began on 7/27. She is having some light bleeding and some spotting which is red to dk brown in color. She is also having some cramping. She wants to know if this is a side effect from the Clomid.  I advised pt that this is not a side effect of Clomid, that she is not having enough bleeding to be a problem and that this sometimes happens after Provera. I stated that this irregular bleeding may continue for several more days. She asked if she can still become pregnant and I responded that she can. I further advised pt to wait 6 weeks from her LMP and if she has not had a period, she should take a home urine pregnancy test. If it is negative, she may then begin with Provera for 10 days followed by Clomid on days 5-9 of her cycle. Pt voiced understanding of all instructions and information given.

## 2013-08-01 ENCOUNTER — Ambulatory Visit: Payer: Self-pay | Admitting: Obstetrics and Gynecology

## 2013-08-18 ENCOUNTER — Other Ambulatory Visit: Payer: Self-pay

## 2013-09-14 ENCOUNTER — Encounter: Payer: Self-pay | Admitting: Nurse Practitioner

## 2013-09-15 ENCOUNTER — Encounter: Payer: Self-pay | Admitting: *Deleted

## 2013-09-23 ENCOUNTER — Encounter: Payer: Self-pay | Admitting: *Deleted

## 2013-10-21 ENCOUNTER — Ambulatory Visit (INDEPENDENT_AMBULATORY_CARE_PROVIDER_SITE_OTHER): Payer: Self-pay | Admitting: Nurse Practitioner

## 2013-10-21 ENCOUNTER — Encounter: Payer: Self-pay | Admitting: Nurse Practitioner

## 2013-10-21 VITALS — BP 154/91 | HR 100 | Temp 98.0°F | Ht 66.0 in | Wt 338.2 lb

## 2013-10-21 DIAGNOSIS — E669 Obesity, unspecified: Secondary | ICD-10-CM

## 2013-10-21 DIAGNOSIS — N949 Unspecified condition associated with female genital organs and menstrual cycle: Secondary | ICD-10-CM

## 2013-10-21 DIAGNOSIS — I10 Essential (primary) hypertension: Secondary | ICD-10-CM | POA: Insufficient documentation

## 2013-10-21 DIAGNOSIS — Z01812 Encounter for preprocedural laboratory examination: Secondary | ICD-10-CM

## 2013-10-21 DIAGNOSIS — E282 Polycystic ovarian syndrome: Secondary | ICD-10-CM

## 2013-10-21 DIAGNOSIS — R102 Pelvic and perineal pain: Secondary | ICD-10-CM

## 2013-10-21 LAB — POCT URINALYSIS DIP (DEVICE)
GLUCOSE, UA: NEGATIVE mg/dL
Ketones, ur: NEGATIVE mg/dL
Leukocytes, UA: NEGATIVE
Nitrite: NEGATIVE
Protein, ur: 30 mg/dL — AB
Urobilinogen, UA: 0.2 mg/dL (ref 0.0–1.0)
pH: 5.5 (ref 5.0–8.0)

## 2013-10-21 LAB — POCT PREGNANCY, URINE: PREG TEST UR: NEGATIVE

## 2013-10-21 MED ORDER — NORGESTIMATE-ETH ESTRADIOL 0.25-35 MG-MCG PO TABS: 1.0000 | ORAL_TABLET | Freq: Every day | ORAL | Status: DC

## 2013-10-21 MED ORDER — HYDROCHLOROTHIAZIDE 25 MG PO TABS: 25.0000 mg | ORAL_TABLET | Freq: Every day | ORAL | Status: DC

## 2013-10-21 NOTE — Progress Notes (Signed)
History:  Lori Mcknight is a 28 y.o. G0P0 [facility] follow up on PCOS. She had been on Clomed/Provera regime until June when she took her last Provera. She continued on Clomid monthly. She has not had a cycle since August. She has been spotting off and on since Oct. She does not like to take the Provera due to cramping. She now wants to start BCP to regulate her menses and help with the pelvic pain. She was seen by Dr Tamela OddiJackson-Moore and had a endometrial biopsy which was negative. She has had a recent negative pelvic ultrasound. Her BP is elevated today and has been elevated off and on for years. She did have an elevation of BP when she was on BCP in past. She denies migraine with aura, blood clotting issues.   The following portions of the patient's history were reviewed and updated as appropriate: allergies, current medications, past family history, past medical history, past social history, past surgical history and problem list.  Review of Systems:  Pertinent items are noted in HPI.  Objective:  Physical Exam BP 154/91  Pulse 100  Temp(Src) 98 F (36.7 C) (Oral)  Ht 5\' 6"  (1.676 m)  Wt 338 lb 3.2 oz (153.407 kg)  BMI 54.61 kg/m2  LMP 06/10/2013 GENERAL: Well-developed, well-nourished female in no acute distress.  HEENT: Normocephalic, atraumatic.    Labs and Imaging No results found. Results for orders placed in visit on 10/21/13 (from the past 24 hour(s))  POCT PREGNANCY, URINE     Status: None   Collection Time    10/21/13 11:36 AM      Result Value Range   Preg Test, Ur NEGATIVE  NEGATIVE  POCT URINALYSIS DIP (DEVICE)     Status: Abnormal   Collection Time    10/21/13 11:52 AM      Result Value Range   Glucose, UA NEGATIVE  NEGATIVE mg/dL   Bilirubin Urine SMALL (*) NEGATIVE   Ketones, ur NEGATIVE  NEGATIVE mg/dL   Specific Gravity, Urine >=1.030  1.005 - 1.030   Hgb urine dipstick MODERATE (*) NEGATIVE   pH 5.5  5.0 - 8.0   Protein, ur 30  (*) NEGATIVE mg/dL   Urobilinogen, UA 0.2  0.0 - 1.0 mg/dL   Nitrite NEGATIVE  NEGATIVE   Leukocytes, UA NEGATIVE  NEGATIVE    Assessment & Plan:  Assessment:  PCOS Morbid Obesity Hypertension  Plans:  After lengthy discussion she will use her provera 5mg  x 5 days to start menses, then after bleeding starts she can start her birth control pills. She is asked to keep a daily sx log of BP, headaches and pelvic pains.  Will start her on HCTZ 25 mg daily for control of HTN She will return in 6 weeks for follow up  Delbert PhenixLinda M Wren Pryce, NP 10/21/2013 12:39 PM

## 2013-10-21 NOTE — Progress Notes (Signed)
Pt. States she was on clomid for a while, last dose was in August 2014. States she has not had her period since then, though she has been spotting since October. Pregnancy tests negative. Has been experiencing cramping, sharp pelvic pain to the point where she feels she cant walk; states last month was the worst. Pt. States she would discuss birth control options as she is no longer trying to get pregnant as well as figure out why she is having this pain.

## 2013-10-21 NOTE — Patient Instructions (Signed)
Contraception Choices Birth control (contraception) is the use of any methods or devices to stop pregnancy from happening. Below are some methods to help avoid pregnancy. HORMONAL BIRTH CONTROL  A small tube put under the skin of the upper arm (implant). The tube can stay in place for 3 years. The implant must be taken out after 3 years.  Shots given every 3 months.  Pills taken every day.  Patches that are changed once a week.  A ring put into the vagina (vaginal ring). The ring is left in place for 3 weeks and removed for 1 week. Then, a new ring is put in the vagina.  Emergency birth control pills taken after unprotected sex (intercourse). BARRIER BIRTH CONTROL   A thin covering worn on the penis (female condom) during sex.  A soft, loose covering put into the vagina (female condom) before sex.  A rubber bowl that sits over the cervix (diaphragm). The bowl must be made for you. The bowl is put into the vagina before sex. The bowl is left in place for 6 to 8 hours after sex.  A small, soft cup that fits over the cervix (cervical cap). The cup must be made for you. The cup can be left in place for 48 hours after sex.  A sponge that is put into the vagina before sex.  A chemical that kills or stops sperm from getting into the cervix and uterus (spermicide). The chemical may be a cream, jelly, foam, or pill. INTRAUTERINE (IUD) BIRTH CONTROL   IUD birth control is a small, T-shaped piece of plastic. The plastic is put inside the uterus. There are 2 types of IUD:  Copper IUD. The IUD is covered in copper wire. The copper makes a fluid that kills sperm. It can stay in place for 10 years.  Hormone IUD. The hormone stops pregnancy from happening. It can stay in place for 5 years. PERMANENT METHODS  When the woman has her fallopian tubes sealed, tied, or blocked during surgery. This stops the egg from traveling to the uterus.  The doctor places a small coil or insert into each fallopian  tube. This causes scar tissue to form and blocks the fallopian tubes.  When the female has the tubes that carry sperm tied off (vasectomy). NATURAL FAMILY PLANNING BIRTH CONTROL   Natural family planning means not having sex or using barrier birth control on the days the woman could become pregnant.  Use a calendar to keep track of the length of each period and know the days she can get pregnant.  Avoid sex during ovulation.  Use a thermometer to measure body temperature. Also watch for symptoms of ovulation.  Time sex to be after the woman has ovulated. Use condoms to help protect yourself against sexually transmitted infections (STIs). Do this no matter what type of birth control you use. Talk to your doctor about which type of birth control is best for you. Document Released: 07/27/2009 Document Revised: 06/01/2013 Document Reviewed: 04/20/2013 [facility] Patient Information 2014 [place], [place]. Hypertension As your heart beats, it forces blood through your arteries. This force is your blood pressure. If the pressure is too high, it is called hypertension (HTN) or high blood pressure. HTN is dangerous because you may have it and not know it. High blood pressure may mean that your heart has to work harder to pump blood. Your arteries may be narrow or stiff. The extra work puts you at risk for heart disease, stroke, and other problems.  Blood pressure consists of two numbers, a higher number over a lower, 110/72, for example. It is stated as "110 over 72." The ideal is below 120 for the top number (systolic) and under 80 for the bottom (diastolic). Write down your blood pressure today. You should pay close attention to your blood pressure if you have certain conditions such as:  Heart failure.  Prior heart attack.  Diabetes  Chronic kidney disease.  Prior stroke.  Multiple risk factors for heart disease. To see if you have HTN, your blood pressure should be measured while you are  seated with your arm held at the level of the heart. It should be measured at least twice. A one-time elevated blood pressure reading (especially in the Emergency Department) does not mean that you need treatment. There may be conditions in which the blood pressure is different between your right and left arms. It is important to see your caregiver soon for a recheck. Most people have essential hypertension which means that there is not a specific cause. This type of high blood pressure may be lowered by changing lifestyle factors such as:  Stress.  Smoking.  Lack of exercise.  Excessive weight.  Drug/tobacco/alcohol use.  Eating less salt. Most people do not have symptoms from high blood pressure until it has caused damage to the body. Effective treatment can often prevent, delay or reduce that damage. TREATMENT  When a cause has been identified, treatment for high blood pressure is directed at the cause. There are a large number of medications to treat HTN. These fall into several categories, and your caregiver will help you select the medicines that are best for you. Medications may have side effects. You should review side effects with your caregiver. If your blood pressure stays high after you have made lifestyle changes or started on medicines,   Your medication(s) may need to be changed.  Other problems may need to be addressed.  Be certain you understand your prescriptions, and know how and when to take your medicine.  Be sure to follow up with your caregiver within the time frame advised (usually within two weeks) to have your blood pressure rechecked and to review your medications.  If you are taking more than one medicine to lower your blood pressure, make sure you know how and at what times they should be taken. Taking two medicines at the same time can result in blood pressure that is too low. SEEK IMMEDIATE MEDICAL CARE IF:  You develop a severe headache, blurred or  changing vision, or confusion.  You have unusual weakness or numbness, or a faint feeling.  You have severe chest or abdominal pain, vomiting, or breathing problems. MAKE SURE YOU:   Understand these instructions.  Will watch your condition.  Will get help right away if you are not doing well or get worse. Document Released: 09/29/2005 Document Revised: 12/22/2011 Document Reviewed: 05/19/2008 [facility] Patient Information 2014 [place], [place]. Polycystic Ovarian Syndrome Polycystic ovarian syndrome is a condition with a number of problems. One problem is with the ovaries. The ovaries are organs located in the female pelvis, on each side of the uterus. Usually, during the menstrual cycle, an egg is released from 1 ovary every month. This is called ovulation. When the egg is fertilized, it goes into the womb (uterus), which allows for the growth of a baby. The egg travels from the ovary through the fallopian tube to the uterus. The ovaries also make the hormones estrogen and progesterone. These hormones  help the development of a woman's breasts, body shape, and body hair. They also regulate the menstrual cycle and pregnancy. Sometimes, cysts form in the ovaries. A cyst is a fluid-filled sac. On the ovary, different types of cysts can form. The most common type of ovarian cyst is called a functional or ovulation cyst. It is normal, and often forms during the normal menstrual cycle. Each month, a woman's ovaries grow tiny cysts that hold the eggs. When an egg is fully grown, the sac breaks open. This releases the egg. Then, the sac which released the egg from the ovary dissolves. In one type of functional cyst, called a follicle cyst, the sac does not break open to release the egg. It may actually continue to grow. This type of cyst usually disappears within 1 to 3 months.  One type of cyst problem with the ovaries is called Polycystic Ovarian Syndrome (PCOS). In this condition, many follicle cysts  form, but do not rupture and produce an egg. This health problem can affect the following:  Menstrual cycle.  Heart.  Obesity.  Cancer of the uterus.  Fertility.  Blood vessels.  Hair growth (face and body) or baldness.  Hormones.  Appearance.  High blood pressure.  Stroke.  Insulin production.  Inflammation of the liver.  Elevated blood cholesterol and triglycerides. CAUSES   No one knows the exact cause of PCOS.  Women with PCOS often have a mother or sister with PCOS. There is not yet enough proof to say this is inherited.  Many women with PCOS have a weight problem.  Researchers are looking at the relationship between PCOS and the body's ability to make insulin. Insulin is a hormone that regulates the change of sugar, starches, and other food into energy for the body's use, or for storage. Some women with PCOS make too much insulin. It is possible that the ovaries react by making too many female hormones, called androgens. This can lead to acne, excessive hair growth, weight gain, and ovulation problems.  Too much production of luteinizing hormone (LH) from the pituitary gland in the brain stimulates the ovary to produce too much female hormone (androgen). SYMPTOMS   Infrequent or no menstrual periods, and/or irregular bleeding.  Inability to get pregnant (infertility), because of not ovulating.  Increased growth of hair on the face, chest, stomach, back, thumbs, thighs, or toes.  Acne, oily skin, or dandruff.  Pelvic pain.  Weight gain or obesity, usually carrying extra weight around the waist.  Type 2 diabetes (this is the diabetes that usually does not need insulin).  High cholesterol.  High blood pressure.  Female-pattern baldness or thinning hair.  Patches of thickened and dark brown or black skin on the neck, arms, breasts, or thighs.  Skin tags, or tiny excess flaps of skin, in the armpits or neck area.  Sleep apnea (excessive snoring and  breathing stops at times while asleep).  Deepening of the voice.  Gestational diabetes when pregnant.  Increased risk of miscarriage with pregnancy. DIAGNOSIS  There is no single test to diagnose PCOS.   Your caregiver will:  Take a medical history.  Perform a pelvic exam.  Perform an ultrasound.  Check your female and female hormone levels.  Measure glucose or sugar levels in the blood.  Do other blood tests.  If you are producing too many female hormones, your caregiver will make sure it is from PCOS. At the physical exam, your caregiver will want to evaluate the areas of increased hair  growth. Try to allow natural hair growth for a few days before the visit.  During a pelvic exam, the ovaries may be enlarged or swollen by the increased number of small cysts. This can be seen more easily by vaginal ultrasound or screening, to examine the ovaries and lining of the uterus (endometrium) for cysts. The uterine lining may become thicker, if there has not been a regular period. TREATMENT  Because there is no cure for PCOS, it needs to be managed to prevent problems. Treatments are based on your symptoms. Treatment is also based on whether you want to have a baby or whether you need contraception.  Treatment may include:  Progesterone hormone, to start a menstrual period.  Birth control pills, to make you have regular menstrual periods.  Medicines to make you ovulate, if you want to get pregnant.  Medicines to control your insulin.  Medicine to control your blood pressure.  Medicine and diet, to control your high cholesterol and triglycerides in your blood.  Surgery, making small holes in the ovary, to decrease the amount of female hormone production. This is done through a long, lighted tube (laparoscope), placed into the pelvis through a tiny incision in the lower abdomen. Your caregiver will go over some of the choices with you. WOMEN WITH PCOS HAVE THESE CHARACTERISTICS:  High  levels of female hormones called androgens.  An irregular or no menstrual cycle.  May have many small cysts in their ovaries. PCOS is the most common hormonal reproductive problem in women of childbearing age. WHY DO WOMEN WITH PCOS HAVE TROUBLE WITH THEIR MENSTRUAL CYCLE? Each month, about 20 eggs start to mature in the ovaries. As one egg grows and matures, the follicle breaks open to release the egg, so it can travel through the fallopian tube for fertilization. When the single egg leaves the follicle, ovulation takes place. In women with PCOS, the ovary does not make all of the hormones it needs for any of the eggs to fully mature. They may start to grow and accumulate fluid, but no one egg becomes large enough. Instead, some may remain as cysts. Since no egg matures or is released, ovulation does not occur and the hormone progesterone is not made. Without progesterone, a woman's menstrual cycle is irregular or absent. Also, the cysts produce female hormones, which continue to prevent ovulation.  Document Released: 01/23/2005 Document Revised: 12/22/2011 Document Reviewed: 03/17/2013 [facility] Patient Information 2014 [place], [place].

## 2013-11-16 ENCOUNTER — Inpatient Hospital Stay (HOSPITAL_COMMUNITY): Payer: Self-pay

## 2013-11-16 ENCOUNTER — Inpatient Hospital Stay (HOSPITAL_COMMUNITY)
Admission: AD | Admit: 2013-11-16 | Discharge: 2013-11-16 | Disposition: A | Discharge status: Home / Self Care | Payer: Self-pay | Source: Ambulatory Visit | Attending: Obstetrics and Gynecology | Admitting: Obstetrics and Gynecology

## 2013-11-16 ENCOUNTER — Encounter (HOSPITAL_COMMUNITY): Payer: Self-pay | Admitting: *Deleted

## 2013-11-16 DIAGNOSIS — N925 Other specified irregular menstruation: Secondary | ICD-10-CM | POA: Insufficient documentation

## 2013-11-16 DIAGNOSIS — N938 Other specified abnormal uterine and vaginal bleeding: Secondary | ICD-10-CM | POA: Insufficient documentation

## 2013-11-16 DIAGNOSIS — E282 Polycystic ovarian syndrome: Secondary | ICD-10-CM | POA: Insufficient documentation

## 2013-11-16 DIAGNOSIS — Z87891 Personal history of nicotine dependence: Secondary | ICD-10-CM | POA: Insufficient documentation

## 2013-11-16 DIAGNOSIS — N949 Unspecified condition associated with female genital organs and menstrual cycle: Secondary | ICD-10-CM | POA: Insufficient documentation

## 2013-11-16 DIAGNOSIS — R9389 Abnormal findings on diagnostic imaging of other specified body structures: Secondary | ICD-10-CM | POA: Insufficient documentation

## 2013-11-16 DIAGNOSIS — N946 Dysmenorrhea, unspecified: Secondary | ICD-10-CM | POA: Insufficient documentation

## 2013-11-16 DIAGNOSIS — N920 Excessive and frequent menstruation with regular cycle: Secondary | ICD-10-CM

## 2013-11-16 LAB — URINE MICROSCOPIC-ADD ON

## 2013-11-16 LAB — CBC
HEMATOCRIT: 37.3 % (ref 36.0–46.0)
HEMOGLOBIN: 12.3 g/dL (ref 12.0–15.0)
MCH: 29.7 pg (ref 26.0–34.0)
MCHC: 33 g/dL (ref 30.0–36.0)
MCV: 90.1 fL (ref 78.0–100.0)
Platelets: 244 10*3/uL (ref 150–400)
RBC: 4.14 MIL/uL (ref 3.87–5.11)
RDW: 13.3 % (ref 11.5–15.5)
WBC: 8.9 10*3/uL (ref 4.0–10.5)

## 2013-11-16 LAB — URINALYSIS, ROUTINE W REFLEX MICROSCOPIC
Bilirubin Urine: NEGATIVE
GLUCOSE, UA: NEGATIVE mg/dL
Ketones, ur: NEGATIVE mg/dL
LEUKOCYTES UA: NEGATIVE
Nitrite: NEGATIVE
Protein, ur: NEGATIVE mg/dL
SPECIFIC GRAVITY, URINE: 1.025 (ref 1.005–1.030)
UROBILINOGEN UA: 0.2 mg/dL (ref 0.0–1.0)
pH: 6 (ref 5.0–8.0)

## 2013-11-16 LAB — WET PREP, GENITAL
Trich, Wet Prep: NONE SEEN
WBC, Wet Prep HPF POC: NONE SEEN
Yeast Wet Prep HPF POC: NONE SEEN

## 2013-11-16 LAB — POCT PREGNANCY, URINE: Preg Test, Ur: NEGATIVE

## 2013-11-16 MED ORDER — MEGESTROL ACETATE 20 MG PO TABS: 20.0000 mg | ORAL_TABLET | ORAL | Status: DC

## 2013-11-16 NOTE — MAU Provider Note (Signed)
@MAUPATCONTACT @  Chief Complaint:  Vaginal Bleeding and Pelvic Pain   Lori Mcknight is  28 y.o. G0P0 presents complaining of Vaginal Bleeding and Pelvic Pain   Pt with a known case of PCOS and a hx of irregular periods presents complaining of Long term vaginal bleeding(since jan 12th) with pelvic pain(mostly on the left side) Pt last menstrual period was august 29th, she was taking provera and clomid at that time, since then she has had a few episodes of spotting but no menstrual period. On jan 12th she began bleeding heavily with pelvic pain consistent with a menstrual period. Bleeding has been persistent since then, alternating between very heavy+coin sized clots to very light(barely requiring a pad). Pelvic pain was moderate to severe in intensity, mostly on left side + painful bowel movts. Today, her bleeding is light. Pt denies any lightheadedness, palpitations or SOB. +dizziness on standing too quickly sometimes. Menstrual history: Irregular periods lasting average 11 days with moderate to heavy bleeding She is currently not on any medications  Obstetrical/Gynecological History: OB History   Grav Para Term Preterm Abortions TAB SAB Ect Mult Living   0              Past Medical History: Past Medical History  Diagnosis Date  . PCOS (polycystic ovarian syndrome)   . Asthma   . Hypertension     had rx from hosp, ran out 2 yrs ago  . Obese   . Urinary tract infection   . Chlamydia   . Genital herpes   . Morbid obesity     Past Surgical History: Past Surgical History  Procedure Laterality Date  . Wisdom tooth extraction      Family History: Family History  Problem Relation Age of Onset  . Heart disease Mother   . Diabetes Father   . Heart disease Father   . Heart disease Maternal Grandmother   . Diabetes Maternal Grandfather   . Heart disease Maternal Grandfather   . Sleep apnea Mother     Social History: History  Substance Use Topics  . Smoking status: Former  Games developer  . Smokeless tobacco: Never Used     Comment: quit 2009  . Alcohol Use: Yes     Comment: occasonal     Allergies: No Known Allergies  Meds:  Prescriptions prior to admission  Medication Sig Dispense Refill  . Prenatal Vit-Fe Fumarate-FA (PRENATAL MULTIVITAMIN) TABS tablet Take 1 tablet by mouth daily at 12 noon.      Marland Kitchen albuterol (PROVENTIL HFA;VENTOLIN HFA) 108 (90 BASE) MCG/ACT inhaler Inhale 2 puffs into the lungs every 6 (six) hours as needed (asthma).        Review of Systems -   Review of Systems  Constitutional: Negative for fever Cardiovascular: Negative for chest pain, palpitations, orthopnea,  leg swelling  Gastrointestinal: Positive for abdominal pain. Negative for heartburn, nausea, vomiting, diarrhea, constipation, blood in stool Genitourinary: Negative for dysuria, urgency, frequency, hematuria and flank pain.    Physical Exam  Blood pressure 147/88, pulse 98, temperature 98.2 F (36.8 C), temperature source Oral, resp. rate 20, height 5\' 4"  (1.626 m), weight 154.223 kg (340 lb), last menstrual period 11/14/2013. GENERAL: Well-developed, well-nourished female in no acute distress.  LUNGS: Clear to auscultation bilaterally.  HEART: Regular rate and rhythm. ABDOMEN: Soft, tenderness in the suprapubic region and left lower quadrant. Unable to palpate adnexa   Labs: Results for orders placed during the hospital encounter of 11/16/13 (from the past 24 hour(s))  URINALYSIS,  ROUTINE W REFLEX MICROSCOPIC   Collection Time    11/16/13  5:00 PM      Result Value Range   Color, Urine YELLOW  YELLOW   APPearance CLEAR  CLEAR   Specific Gravity, Urine 1.025  1.005 - 1.030   pH 6.0  5.0 - 8.0   Glucose, UA NEGATIVE  NEGATIVE mg/dL   Hgb urine dipstick LARGE (*) NEGATIVE   Bilirubin Urine NEGATIVE  NEGATIVE   Ketones, ur NEGATIVE  NEGATIVE mg/dL   Protein, ur NEGATIVE  NEGATIVE mg/dL   Urobilinogen, UA 0.2  0.0 - 1.0 mg/dL   Nitrite NEGATIVE  NEGATIVE    Leukocytes, UA NEGATIVE  NEGATIVE  URINE MICROSCOPIC-ADD ON   Collection Time    11/16/13  5:00 PM      Result Value Range   Squamous Epithelial / LPF RARE  RARE   WBC, UA 0-2  <3 WBC/hpf   RBC / HPF 3-6  <3 RBC/hpf   Bacteria, UA RARE  RARE  POCT PREGNANCY, URINE   Collection Time    11/16/13  5:14 PM      Result Value Range   Preg Test, Ur NEGATIVE  NEGATIVE  CBC   Collection Time    11/16/13  5:36 PM      Result Value Range   WBC 8.9  4.0 - 10.5 K/uL   RBC 4.14  3.87 - 5.11 MIL/uL   Hemoglobin 12.3  12.0 - 15.0 g/dL   HCT 16.137.3  09.636.0 - 04.546.0 %   MCV 90.1  78.0 - 100.0 fL   MCH 29.7  26.0 - 34.0 pg   MCHC 33.0  30.0 - 36.0 g/dL   RDW 40.913.3  81.111.5 - 91.415.5 %   Platelets 244  150 - 400 K/uL   Imaging Studies:  No results found.  Assessment: Lori Mcknight is  28 y.o. G0P0 presents with Vaginal Bleeding and Pelvic Pain. History is consistent with pt's known PCOS Ultrasound ordered to evaluate left lower quadrant pain  Plan: Reassurance of Patient Megace taper to control bleeding Schedule clinic f/u visit Discharge home pending normal Ultrasound findings   Sallyanne HaversMuazu, Aisha 2/4/20157:14 PM   Ultrasound pending, report to [place] Smith, CNM  Sharen CounterLisa Leftwich-Kirby Certified Nurse-Midwife

## 2013-11-16 NOTE — MAU Note (Signed)
Has been bleeding for over 3 wks.  Thought it was just her period, hadn't had a period since Aug.   Has been bleeding off and on since Oct.  Some time spotting, some times heavy, passing a lot of clots.  Having a lot of pelvic pain. Hurts to have a BM.   Bleeding is light today.

## 2013-11-16 NOTE — Discharge Instructions (Signed)
Abnormal Uterine Bleeding °Abnormal uterine bleeding can affect women at various stages in life, including teenagers, women in their reproductive years, pregnant women, and women who have reached menopause. Several kinds of uterine bleeding are considered abnormal, including: °· Bleeding or spotting between periods.   °· Bleeding after sexual intercourse.   °· Bleeding that is heavier or more than normal.   °· Periods that last longer than usual. °· Bleeding after menopause.   °Many cases of abnormal uterine bleeding are minor and simple to treat, while others are more serious. Any type of abnormal bleeding should be evaluated by your health care provider. Treatment will depend on the cause of the bleeding. °HOME CARE INSTRUCTIONS °Monitor your condition for any changes. The following actions may help to alleviate any discomfort you are experiencing: °· Avoid the use of tampons and douches as directed by your health care provider. °· Change your pads frequently. °You should get regular pelvic exams and Pap tests. Keep all follow-up appointments for diagnostic tests as directed by your health care provider.  °SEEK MEDICAL CARE IF:  °· Your bleeding lasts more than 1 week.   °· You feel dizzy at times.   °SEEK IMMEDIATE MEDICAL CARE IF:  °· You pass out.   °· You are changing pads every 15 to 30 minutes.   °· You have abdominal pain. °· You have a fever.   °· You become sweaty or weak.   °· You are passing large blood clots from the vagina.   °· You start to feel nauseous and vomit. °MAKE SURE YOU:  °· Understand these instructions. °· Will watch your condition. °· Will get help right away if you are not doing well or get worse. °Document Released: 09/29/2005 Document Revised: 06/01/2013 Document Reviewed: 04/28/2013 °ExitCare® Patient Information ©2014 [employer]. ° °Endometrial Biopsy °Endometrial biopsy is a procedure in which a tissue sample is taken from inside the uterus. The tissue sample is then looked at  under a microscope to see if the tissue is normal or abnormal. The endometrium is the lining of the uterus. This procedure helps determine where you are in your menstrual cycle and how hormone levels are affecting the lining of the uterus. This procedure may also be used to evaluate uterine bleeding or to diagnose endometrial cancer, tuberculosis, polyps, or inflammatory conditions.  °LET YOUR HEALTH CARE PROVIDER KNOW ABOUT: °· Any allergies you have. °· All medicines you are taking, including vitamins, herbs, eye drops, creams, and over-the-counter medicines. °· Previous problems you or members of your family have had with the use of anesthetics. °· Any blood disorders you have. °· Previous surgeries you have had. °· Medical conditions you have. °· Possibility of pregnancy. °RISKS AND COMPLICATIONS °Generally, this is a safe procedure. However, as with any procedure, complications can occur. Possible complications include: °· Bleeding. °· Pelvic infection. °· Puncture of the uterine wall with the biopsy device (rare). °BEFORE THE PROCEDURE  °· Keep a record of your menstrual cycles as directed by your health care provider. You may need to schedule your procedure for a specific time in your cycle. °· You may want to bring a sanitary pad to wear home after the procedure. °· Arrange for someone to drive you home after the procedure if you will be given a medicine to help you relax (sedative).  °PROCEDURE  °· You may be given a sedative to relax you. °· You will lie on an exam table with your feet and legs supported as in a pelvic exam. °· Your health care provider will insert an   instrument (speculum) into your vagina to see your cervix. °· Your cervix will be cleansed with an antiseptic solution. A medicine (local anesthetic) will be used to numb the cervix. °· A forceps instrument (tenaculum) will be used to hold your cervix steady for the biopsy. °· A thin, rodlike instrument (uterine sound) will be inserted  through your cervix to determine the length of your uterus and the location where the biopsy sample will be removed. °· A thin, flexible tube (catheter) will be inserted through your cervix and into the uterus. The catheter is used to collect the biopsy sample from your endometrial tissue. °· The catheter and speculum will then be removed, and the tissue sample will be sent to a lab for examination. °AFTER THE PROCEDURE °· You will rest in a recovery area until you are ready to go home. °· You may have mild cramping and a small amount of vaginal bleeding for a few days after the procedure. This is normal. °· Make sure you find out how to get your test results. °Document Released: 01/30/2005 Document Revised: 06/01/2013 Document Reviewed: 03/16/2013 °ExitCare® Patient Information ©2014 [employer]. ° °

## 2013-11-17 LAB — GC/CHLAMYDIA PROBE AMP
CT PROBE, AMP APTIMA: NEGATIVE
GC PROBE AMP APTIMA: NEGATIVE

## 2013-12-02 ENCOUNTER — Ambulatory Visit (INDEPENDENT_AMBULATORY_CARE_PROVIDER_SITE_OTHER): Payer: Self-pay | Admitting: Obstetrics & Gynecology

## 2013-12-02 ENCOUNTER — Encounter: Payer: Self-pay | Admitting: Obstetrics & Gynecology

## 2013-12-02 VITALS — BP 135/94 | HR 93 | Temp 98.0°F | Ht 66.0 in | Wt 340.2 lb

## 2013-12-02 DIAGNOSIS — E282 Polycystic ovarian syndrome: Secondary | ICD-10-CM

## 2013-12-02 DIAGNOSIS — R9389 Abnormal findings on diagnostic imaging of other specified body structures: Secondary | ICD-10-CM

## 2013-12-02 NOTE — Patient Instructions (Signed)

## 2013-12-02 NOTE — Progress Notes (Signed)
Patient ID: Lori Mcknight, female   DOB: Dec 23, 1985, 28 y.o.   MRN: [id number]  Chief Complaint  Patient presents with  . Follow-up    HPI Lori Mcknight is a 28 y.o. female.  G0P0 Patient's last menstrual period was 10/24/2013. Last period was 3 weeks in duration. Has note started OCP yet.  HPI  Past Medical History  Diagnosis Date  . PCOS (polycystic ovarian syndrome)   . Asthma   . Hypertension     had rx from hosp, ran out 2 yrs ago  . Obese   . Urinary tract infection   . Chlamydia   . Genital herpes   . Morbid obesity     Past Surgical History  Procedure Laterality Date  . Wisdom tooth extraction      Family History  Problem Relation Age of Onset  . Heart disease Mother   . Diabetes Father   . Heart disease Father   . Heart disease Maternal Grandmother   . Diabetes Maternal Grandfather   . Heart disease Maternal Grandfather   . Sleep apnea Mother     Social History History  Substance Use Topics  . Smoking status: Former Games developermoker  . Smokeless tobacco: Never Used     Comment: quit 2009  . Alcohol Use: Yes     Comment: occasonal     No Known Allergies  Current Outpatient Prescriptions  Medication Sig Dispense Refill  . albuterol (PROVENTIL HFA;VENTOLIN HFA) 108 (90 BASE) MCG/ACT inhaler Inhale 2 puffs into the lungs every 6 (six) hours as needed (asthma).      . megestrol (MEGACE) 20 MG tablet Take 1 tablet (20 mg total) by mouth as directed. Take two tablets (40 mg) three times per day time three days,  then take two tablets (40 mg) two times per day time three days,  then take two tablets (40 mg)once per day  60 tablet  1  . Prenatal Vit-Fe Fumarate-FA (PRENATAL MULTIVITAMIN) TABS tablet Take 1 tablet by mouth daily at 12 noon.       No current facility-administered medications for this visit.    Review of Systems Review of Systems  Constitutional: Negative for fever.  Genitourinary: Positive for menstrual problem. Negative for vaginal bleeding,  vaginal discharge and pelvic pain.    Blood pressure 135/94, pulse 93, temperature 98 F (36.7 C), height 5\' 6"  (1.676 m), weight 340 lb 3.2 oz (154.314 kg), last menstrual period 10/24/2013.  Physical Exam Physical Exam  Constitutional: She is oriented to person, place, and time. No distress.  Morbid obesity  Neurological: She is alert and oriented to person, place, and time.  Psychiatric: She has a normal mood and affect.    Data Reviewed CLINICAL DATA: Left pelvic pain, dysmenorrhea, obesity  EXAM:  TRANSABDOMINAL AND TRANSVAGINAL ULTRASOUND OF PELVIS  TECHNIQUE:  Both transabdominal and transvaginal ultrasound examinations of the  pelvis were performed. Transabdominal technique was performed for  global imaging of the pelvis including uterus, ovaries, adnexal  regions, and pelvic cul-de-sac. It was necessary to proceed with  endovaginal exam following the transabdominal exam to visualize the  ovaries.  COMPARISON: 04/18/2013  FINDINGS:  Uterus  Measurements: 91 x 53 x 69 mm. No fibroids or other mass  visualized.  Endometrium  Thickness: Thickened to 3.1 cm. No focal abnormality visualized.  Right ovary  Not identified on pelvic or transvaginal scanning.  Left ovary  Measurements: 29 x 19 x 17 mm. Normal appearance/no adnexal mass.  Other findings  Trace  free fluid.  IMPRESSION:  1. Abnormal endometrial thickening. Follow-up recommended to exclude  endometrial pathology.  Electronically Signed  By: Oley Balm M.D.  On: 11/16/2013 21:32   Assessment    thickened endometrium, DUB wit h/o PCOS     Plan    Start OCP as Rx and RTC 4 months. Will need [place] f/u to assess endometium        ARNOLD,JAMES 12/02/2013, 11:17 AM

## 2013-12-02 NOTE — Progress Notes (Signed)
States has prescription for hydrochlorathiazide but hasn't started it yet because not sure if ok with megace. Instructed patient is ok to start hctz and important since bp still too high.  Also states bled almost 3 weeks with last menstrual.

## 2013-12-19 ENCOUNTER — Telehealth: Payer: Self-pay | Admitting: *Deleted

## 2013-12-19 ENCOUNTER — Other Ambulatory Visit: Payer: Self-pay | Admitting: Obstetrics & Gynecology

## 2013-12-19 DIAGNOSIS — I10 Essential (primary) hypertension: Secondary | ICD-10-CM

## 2013-12-19 DIAGNOSIS — R9389 Abnormal findings on diagnostic imaging of other specified body structures: Secondary | ICD-10-CM

## 2013-12-19 DIAGNOSIS — R102 Pelvic and perineal pain: Secondary | ICD-10-CM

## 2013-12-19 DIAGNOSIS — E669 Obesity, unspecified: Secondary | ICD-10-CM

## 2013-12-19 DIAGNOSIS — Z01812 Encounter for preprocedural laboratory examination: Secondary | ICD-10-CM

## 2013-12-19 DIAGNOSIS — E282 Polycystic ovarian syndrome: Secondary | ICD-10-CM

## 2013-12-19 MED ORDER — NORGESTIMATE-ETH ESTRADIOL 0.25-35 MG-MCG PO TABS: 1.0000 | ORAL_TABLET | Freq: Every day | ORAL | Status: DC

## 2013-12-19 NOTE — Telephone Encounter (Addendum)
Pt states she went to ED on 2/4 and was given Megestrol for her abnormal bleeding. She was told that she would get a period after completing the medication. She completed the med on 2/26 but has not gotten a period yet. She wants to know when her period will begin. Please call back.  I called pt after reviewing her chart. I asked if she has started taking the birth control pills as mentioned in Dr. Olivia MackieArnold's notes from 12/02/13. She said she has not and that she was waiting to see what would happen when she finished the Megestrol. I advised pt that it may take a full menstrual cycle in order for her to have a period. Since she has not started the Sprintec, I will consult with Dr. Debroah LoopArnold for further direction. After consulting with Dr. Debroah LoopArnold, I called pt and informed her that she should begin the Sprintec today and continue taking them until her next clinic appt which will be in June. She will also need another ultrasound prior to her clinic appt.  I scheduled the US for 03/28/14 @ 1015. She should expect a call in April or May with clinic appt information with Dr. Debroah LoopArnold  for the end of June. Pt voiced understanding of all information and instructions.

## 2013-12-21 ENCOUNTER — Encounter: Payer: Self-pay | Admitting: Obstetrics & Gynecology

## 2014-01-03 ENCOUNTER — Encounter (HOSPITAL_COMMUNITY): Payer: Self-pay | Admitting: *Deleted

## 2014-01-03 ENCOUNTER — Inpatient Hospital Stay (HOSPITAL_COMMUNITY): Payer: Self-pay

## 2014-01-03 ENCOUNTER — Inpatient Hospital Stay (HOSPITAL_COMMUNITY)
Admission: AD | Admit: 2014-01-03 | Discharge: 2014-01-03 | Disposition: A | Discharge status: Home / Self Care | Payer: Self-pay | Source: Ambulatory Visit | Attending: Obstetrics & Gynecology | Admitting: Obstetrics & Gynecology

## 2014-01-03 DIAGNOSIS — I10 Essential (primary) hypertension: Secondary | ICD-10-CM | POA: Insufficient documentation

## 2014-01-03 DIAGNOSIS — R102 Pelvic and perineal pain: Secondary | ICD-10-CM

## 2014-01-03 DIAGNOSIS — M549 Dorsalgia, unspecified: Secondary | ICD-10-CM | POA: Insufficient documentation

## 2014-01-03 DIAGNOSIS — R109 Unspecified abdominal pain: Secondary | ICD-10-CM | POA: Insufficient documentation

## 2014-01-03 DIAGNOSIS — J45909 Unspecified asthma, uncomplicated: Secondary | ICD-10-CM | POA: Insufficient documentation

## 2014-01-03 DIAGNOSIS — R300 Dysuria: Secondary | ICD-10-CM | POA: Insufficient documentation

## 2014-01-03 DIAGNOSIS — Z87891 Personal history of nicotine dependence: Secondary | ICD-10-CM | POA: Insufficient documentation

## 2014-01-03 DIAGNOSIS — B009 Herpesviral infection, unspecified: Secondary | ICD-10-CM | POA: Insufficient documentation

## 2014-01-03 DIAGNOSIS — N949 Unspecified condition associated with female genital organs and menstrual cycle: Secondary | ICD-10-CM | POA: Insufficient documentation

## 2014-01-03 LAB — CBC
HCT: 35.6 % — ABNORMAL LOW (ref 36.0–46.0)
Hemoglobin: 11.6 g/dL — ABNORMAL LOW (ref 12.0–15.0)
MCH: 28.6 pg (ref 26.0–34.0)
MCHC: 32.6 g/dL (ref 30.0–36.0)
MCV: 87.7 fL (ref 78.0–100.0)
PLATELETS: 224 10*3/uL (ref 150–400)
RBC: 4.06 MIL/uL (ref 3.87–5.11)
RDW: 12.8 % (ref 11.5–15.5)
WBC: 10.6 10*3/uL — ABNORMAL HIGH (ref 4.0–10.5)

## 2014-01-03 LAB — COMPREHENSIVE METABOLIC PANEL
ALT: 99 U/L — ABNORMAL HIGH (ref 0–35)
AST: 46 U/L — AB (ref 0–37)
Albumin: 3.5 g/dL (ref 3.5–5.2)
Alkaline Phosphatase: 67 U/L (ref 39–117)
BUN: 9 mg/dL (ref 6–23)
CALCIUM: 8.9 mg/dL (ref 8.4–10.5)
CO2: 19 mEq/L (ref 19–32)
CREATININE: 0.74 mg/dL (ref 0.50–1.10)
Chloride: 103 mEq/L (ref 96–112)
GFR calc non Af Amer: >90 mL/min (ref >90)
Glucose, Bld: 103 mg/dL — ABNORMAL HIGH (ref 70–99)
Potassium: 3.7 mEq/L (ref 3.7–5.3)
SODIUM: 136 meq/L — AB (ref 137–147)
Total Bilirubin: <0.2 mg/dL — ABNORMAL LOW (ref 0.3–1.2)
Total Protein: 7.2 g/dL (ref 6.0–8.3)

## 2014-01-03 LAB — URINALYSIS, ROUTINE W REFLEX MICROSCOPIC
BILIRUBIN URINE: NEGATIVE
Glucose, UA: NEGATIVE mg/dL
Hgb urine dipstick: NEGATIVE
Ketones, ur: NEGATIVE mg/dL
LEUKOCYTES UA: NEGATIVE
NITRITE: NEGATIVE
PH: 5.5 (ref 5.0–8.0)
Protein, ur: NEGATIVE mg/dL
SPECIFIC GRAVITY, URINE: 1.025 (ref 1.005–1.030)
UROBILINOGEN UA: 0.2 mg/dL (ref 0.0–1.0)

## 2014-01-03 LAB — POCT PREGNANCY, URINE: Preg Test, Ur: NEGATIVE

## 2014-01-03 MED ORDER — KETOROLAC TROMETHAMINE 60 MG/2ML IM SOLN
60.0000 mg | Freq: Once | INTRAMUSCULAR | Status: AC
  Administered 2014-01-03: 60 mg via INTRAMUSCULAR
  Filled 2014-01-03: qty 2

## 2014-01-03 MED ORDER — HYDROCODONE-ACETAMINOPHEN 5-325 MG PO TABS
2.0000 | ORAL_TABLET | Freq: Once | ORAL | Status: AC
  Administered 2014-01-03: 2 via ORAL
  Filled 2014-01-03: qty 2

## 2014-01-03 NOTE — Discharge Instructions (Signed)
Pelvic Pain, Female °Female pelvic pain can be caused by many different things and start from a variety of places. Pelvic pain refers to pain that is located in the lower half of the abdomen and between your hips. The pain may occur over a short period of time (acute) or may be reoccurring (chronic). The cause of pelvic pain may be related to disorders affecting the female reproductive organs (gynecologic), but it may also be related to the bladder, kidney stones, an intestinal complication, or muscle or skeletal problems. Getting help right away for pelvic pain is important, especially if there has been severe, sharp, or a sudden onset of unusual pain. It is also important to get help right away because some types of pelvic pain can be life threatening.  °CAUSES  °Below are only some of the causes of pelvic pain. The causes of pelvic pain can be in one of several categories.  °· Gynecologic. °· Pelvic inflammatory disease. °· Sexually transmitted infection. °· Ovarian cyst or a twisted ovarian ligament (ovarian torsion). °· Uterine lining that grows outside the uterus (endometriosis). °· Fibroids, cysts, or tumors. °· Ovulation. °· Pregnancy. °· Pregnancy that occurs outside the uterus (ectopic pregnancy). °· Miscarriage. °· Labor. °· Abruption of the placenta or ruptured uterus. °· Infection. °· Uterine infection (endometritis). °· Bladder infection. °· Diverticulitis. °· Miscarriage related to a uterine infection (septic abortion). °· Bladder. °· Inflammation of the bladder (cystitis). °· Kidney stone(s). °· Gastrointenstinal. °· Constipation. °· Diverticulitis. °· Neurologic. °· Trauma. °· Feeling pelvic pain because of mental or emotional causes (psychosomatic). °· Cancers of the bowel or pelvis. °EVALUATION  °Your caregiver will want to take a careful history of your concerns. This includes recent changes in your health, a careful gynecologic history of your periods (menses), and a sexual history. Obtaining  your family history and medical history is also important. Your caregiver may suggest a pelvic exam. A pelvic exam will help identify the location and severity of the pain. It also helps in the evaluation of which organ system may be involved. In order to identify the cause of the pelvic pain and be properly treated, your caregiver may order tests. These tests may include:  °· A pregnancy test. °· Pelvic ultrasonography. °· An X-ray exam of the abdomen. °· A urinalysis or evaluation of vaginal discharge. °· Blood tests. °HOME CARE INSTRUCTIONS  °· Only take over-the-counter or prescription medicines for pain, discomfort, or fever as directed by your caregiver.   °· Rest as directed by your caregiver.   °· Eat a balanced diet.   °· Drink enough fluids to make your urine clear or pale yellow, or as directed.   °· Avoid sexual intercourse if it causes pain.   °· Apply warm or cold compresses to the lower abdomen depending on which one helps the pain.   °· Avoid stressful situations.   °· Keep a journal of your pelvic pain. Write down when it started, where the pain is located, and if there are things that seem to be associated with the pain, such as food or your menstrual cycle. °· Follow up with your caregiver as directed.   °SEEK MEDICAL CARE IF: °· Your medicine does not help your pain. °· You have abnormal vaginal discharge. °SEEK IMMEDIATE MEDICAL CARE IF:  °· You have heavy bleeding from the vagina.   °· Your pelvic pain increases.   °· You feel lightheaded or faint.   °· You have chills.   °· You have pain with urination or blood in your urine.   °· You have uncontrolled   diarrhea or vomiting.   °· You have a fever or persistent symptoms for more than 3 days. °· You have a fever and your symptoms suddenly get worse.   °· You are being physically or sexually abused.   °MAKE SURE YOU: °· Understand these instructions. °· Will watch your condition. °· Will get help if you are not doing well or get worse. °Document  Released: 08/26/2004 Document Revised: 03/30/2012 Document Reviewed: 01/19/2012 °ExitCare® Patient Information ©2014 [employer]. ° °

## 2014-01-03 NOTE — MAU Provider Note (Signed)
History     CSN: [id number]  Arrival date and time: 01/03/14 4098   First Provider Initiated Contact with Patient 01/03/14 0150      Chief Complaint  Patient presents with  . Abdominal Pain  . Back Pain  . Dysuria   Abdominal Pain Associated symptoms include dysuria.  Back Pain Associated symptoms include abdominal pain and dysuria.  Dysuria     Lori Mcknight is a 28 y.o. G0P0 who presents today with lower abdominal pain. She states that the pain started on Sunday, and became much more intense yesterday and over night. She states that the pain is worse when she has a BM or when she empties her bladder. She has not taken anything for the pain in a long time. She states that she took Midol sometime yesterday, and it helped for a little while. She denies any vaginal bleeding or vaginal discharge.   Past Medical History  Diagnosis Date  . PCOS (polycystic ovarian syndrome)   . Asthma   . Hypertension     had rx from hosp, ran out 2 yrs ago  . Obese   . Urinary tract infection   . Chlamydia   . Genital herpes   . Morbid obesity     Past Surgical History  Procedure Laterality Date  . Wisdom tooth extraction      Family History  Problem Relation Age of Onset  . Heart disease Mother   . Diabetes Father   . Heart disease Father   . Heart disease Maternal Grandmother   . Diabetes Maternal Grandfather   . Heart disease Maternal Grandfather   . Sleep apnea Mother     History  Substance Use Topics  . Smoking status: Former Smoker    Quit date: 01/04/2011  . Smokeless tobacco: Never Used     Comment: quit 2009  . Alcohol Use: Yes     Comment: occasonal     Allergies: No Known Allergies  No prescriptions prior to admission    Review of Systems  Gastrointestinal: Positive for abdominal pain.  Genitourinary: Positive for dysuria.  Musculoskeletal: Positive for back pain.   Physical Exam   Blood pressure 143/82, pulse 85, temperature 98.3 F (36.8 C),  temperature source Axillary, resp. rate 20, last menstrual period 01/01/2014, SpO2 100.00%.  Physical Exam  Nursing note and vitals reviewed. Constitutional: She is oriented to person, place, and time. She appears well-developed and well-nourished. No distress.  Cardiovascular: Normal rate.   Respiratory: Effort normal.  GI: Soft. There is tenderness (some tenderness of the suprapubic area. ). There is no rebound and no guarding.  Neurological: She is alert and oriented to person, place, and time.  Skin: Skin is warm and dry.  Psychiatric: She has a normal mood and affect.    MAU Course  Procedures  Results for orders placed during the hospital encounter of 01/03/14 (from the past 24 hour(s))  URINALYSIS, ROUTINE W REFLEX MICROSCOPIC     Status: None   Collection Time    01/03/14  1:45 AM      Result Value Ref Range   Color, Urine YELLOW  YELLOW   APPearance CLEAR  CLEAR   Specific Gravity, Urine 1.025  1.005 - 1.030   pH 5.5  5.0 - 8.0   Glucose, UA NEGATIVE  NEGATIVE mg/dL   Hgb urine dipstick NEGATIVE  NEGATIVE   Bilirubin Urine NEGATIVE  NEGATIVE   Ketones, ur NEGATIVE  NEGATIVE mg/dL   Protein, ur NEGATIVE  NEGATIVE mg/dL   Urobilinogen, UA 0.2  0.0 - 1.0 mg/dL   Nitrite NEGATIVE  NEGATIVE   Leukocytes, UA NEGATIVE  NEGATIVE  POCT PREGNANCY, URINE     Status: None   Collection Time    01/03/14  1:47 AM      Result Value Ref Range   Preg Test, Ur NEGATIVE  NEGATIVE  CBC     Status: Abnormal   Collection Time    01/03/14  3:30 AM      Result Value Ref Range   WBC 10.6 (*) 4.0 - 10.5 K/uL   RBC 4.06  3.87 - 5.11 MIL/uL   Hemoglobin 11.6 (*) 12.0 - 15.0 g/dL   HCT 28.435.6 (*) 13.236.0 - 44.046.0 %   MCV 87.7  78.0 - 100.0 fL   MCH 28.6  26.0 - 34.0 pg   MCHC 32.6  30.0 - 36.0 g/dL   RDW 10.212.8  72.511.5 - 36.615.5 %   Platelets 224  150 - 400 K/uL   [place] Transvaginal Non-ob  01/03/2014   CLINICAL DATA:  Pelvic pain.  EXAM: TRANSVAGINAL ULTRASOUND OF PELVIS  TECHNIQUE: Transvaginal  ultrasound examination of the pelvis was performed including evaluation of the uterus, ovaries, adnexal regions, and pelvic cul-de-sac.  COMPARISON:  Pelvic ultrasound 11/16/2013.  FINDINGS: Uterus  Measurements: 13.7 x 7.6 x 6.3 cm. No fibroids or other mass visualized.  Endometrium  Thickness: 22 mm.  No focal abnormality visualized.  Right ovary  Could not be visualized.  Left ovary  Measurements: 4.2 x 2.3 x 3.3 cm. Normal appearance/no adnexal mass.  Other findings:  No free fluid  IMPRESSION: 1. Limited examination demonstrating no definite acute findings. 2. Endometrial thickness is considered abnormal. Consider follow-up by US in 6-8 weeks, during the week immediately following menses (exam timing is critical).   Electronically Signed   By: Trudie Reedaniel  Entrikin M.D.   On: 01/03/2014 02:48   Patient has had Toradol and Vicodin here in MAU Pain has improved some with pain medication    Assessment and Plan   1. Pelvic pain    Urine Culture pending Follow-up with the clinic as planned Return to MAU as needed  Follow-up Information   [facility] (As scheduled)    Specialty:  Obstetrics and Gynecology   Contact information:   [address] [place] [address] [phone]       Tawnya CrookHogan, Amisha Pospisil Donovan 01/03/2014, 4:07 AM

## 2014-01-04 ENCOUNTER — Encounter (HOSPITAL_COMMUNITY): Payer: Self-pay

## 2014-01-04 ENCOUNTER — Inpatient Hospital Stay (HOSPITAL_COMMUNITY)
Admission: AD | Admit: 2014-01-04 | Discharge: 2014-01-04 | Disposition: A | Discharge status: Home / Self Care | Payer: Self-pay | Source: Ambulatory Visit | Attending: Family Medicine | Admitting: Family Medicine

## 2014-01-04 DIAGNOSIS — N852 Hypertrophy of uterus: Secondary | ICD-10-CM | POA: Insufficient documentation

## 2014-01-04 DIAGNOSIS — E282 Polycystic ovarian syndrome: Secondary | ICD-10-CM | POA: Insufficient documentation

## 2014-01-04 DIAGNOSIS — N939 Abnormal uterine and vaginal bleeding, unspecified: Secondary | ICD-10-CM

## 2014-01-04 DIAGNOSIS — N949 Unspecified condition associated with female genital organs and menstrual cycle: Secondary | ICD-10-CM | POA: Insufficient documentation

## 2014-01-04 DIAGNOSIS — Z87891 Personal history of nicotine dependence: Secondary | ICD-10-CM | POA: Insufficient documentation

## 2014-01-04 DIAGNOSIS — R102 Pelvic and perineal pain: Secondary | ICD-10-CM

## 2014-01-04 DIAGNOSIS — R9389 Abnormal findings on diagnostic imaging of other specified body structures: Secondary | ICD-10-CM | POA: Insufficient documentation

## 2014-01-04 DIAGNOSIS — N898 Other specified noninflammatory disorders of vagina: Secondary | ICD-10-CM

## 2014-01-04 DIAGNOSIS — N840 Polyp of corpus uteri: Secondary | ICD-10-CM | POA: Insufficient documentation

## 2014-01-04 DIAGNOSIS — I10 Essential (primary) hypertension: Secondary | ICD-10-CM | POA: Insufficient documentation

## 2014-01-04 MED ORDER — LACTATED RINGERS IV SOLN
INTRAVENOUS | Status: DC
  Administered 2014-01-04: 06:00:00 via INTRAVENOUS

## 2014-01-04 MED ORDER — KETOROLAC TROMETHAMINE 60 MG/2ML IM SOLN
60.0000 mg | Freq: Once | INTRAMUSCULAR | Status: AC
  Administered 2014-01-04: 60 mg via INTRAMUSCULAR
  Filled 2014-01-04: qty 2

## 2014-01-04 MED ORDER — OXYCODONE-ACETAMINOPHEN 5-325 MG PO TABS: 1.0000 | ORAL_TABLET | ORAL | Status: DC | PRN

## 2014-01-04 MED ORDER — HYDROMORPHONE HCL PF 1 MG/ML IJ SOLN
1.0000 mg | Freq: Once | INTRAMUSCULAR | Status: AC
  Administered 2014-01-04: 1 mg via INTRAVENOUS
  Filled 2014-01-04: qty 1

## 2014-01-04 MED ORDER — MISOPROSTOL 200 MCG PO TABS
800.0000 ug | ORAL_TABLET | Freq: Once | ORAL | Status: AC
  Administered 2014-01-04: 800 ug via ORAL
  Filled 2014-01-04: qty 4

## 2014-01-04 MED ORDER — SODIUM CHLORIDE 0.9 % IV SOLN: INTRAVENOUS | Status: DC

## 2014-01-04 NOTE — MAU Provider Note (Signed)
Attestation of Attending Supervision of Advanced Practitioner (PA/CNM/NP): Evaluation and management procedures were performed by the Advanced Practitioner under my supervision and collaboration.  I have reviewed the Advanced Practitioner's note and chart, and I agree with the management and plan.  Reva BoresPRATT,Quinzell Malcomb S, MD [facility] Attending 01/04/2014 8:09 AM

## 2014-01-04 NOTE — MAU Provider Note (Signed)
History     CSN: [id number]  Arrival date and time: 01/04/14 [id number]   First Provider Initiated Contact with Patient 01/04/14 [phone]      No chief complaint on file.  HPI Ms. Lori Mcknight is a 28 y.o. G0P0 who presents to MAU today with complaint of worsening pelvic pain and "something coming out of my vagina." The patient was seen in MAU last night for pelvic pain that started on Sunday. She had an US that showed an enlarged uterus and thickened endometrium. The patient has a history of irregular bleeding, often going months or years without a period. She was recently placed on OCPs but is only 3 weeks into her first pack. She has not been on OCPs previously. The patient states that since her visit last night her pain has become worse. The pain comes in waves like contractions. She has had a small amount of vaginal bleeding and occasional clear discharge. She states that she got up to go to the bathroom tonight and thought she felt something coming out of her vagina. She took a picture and noted "tissue" coming from the vagina.   OB History   Grav Para Term Preterm Abortions TAB SAB Ect Mult Living   0               Past Medical History  Diagnosis Date  . PCOS (polycystic ovarian syndrome)   . Asthma   . Hypertension     had rx from hosp, ran out 2 yrs ago  . Obese   . Urinary tract infection   . Chlamydia   . Genital herpes   . Morbid obesity     Past Surgical History  Procedure Laterality Date  . Wisdom tooth extraction      Family History  Problem Relation Age of Onset  . Heart disease Mother   . Diabetes Father   . Heart disease Father   . Heart disease Maternal Grandmother   . Diabetes Maternal Grandfather   . Heart disease Maternal Grandfather   . Sleep apnea Mother     History  Substance Use Topics  . Smoking status: Former Smoker    Quit date: 01/04/2011  . Smokeless tobacco: Never Used     Comment: quit 2009  . Alcohol Use: Yes     Comment: occasonal      Allergies: No Known Allergies  Prescriptions prior to admission  Medication Sig Dispense Refill  . norgestimate-ethinyl estradiol (ORTHO-CYCLEN,SPRINTEC,PREVIFEM) 0.25-35 MG-MCG tablet Take 1 tablet by mouth daily.  1 Package  3  . Prenatal Vit-Fe Fumarate-FA (PRENATAL MULTIVITAMIN) TABS tablet Take 1 tablet by mouth daily at 12 noon.      Marland Kitchen. albuterol (PROVENTIL HFA;VENTOLIN HFA) 108 (90 BASE) MCG/ACT inhaler Inhale 2 puffs into the lungs every 6 (six) hours as needed (asthma).      . megestrol (MEGACE) 20 MG tablet Take 1 tablet (20 mg total) by mouth as directed. Take two tablets (40 mg) three times per day time three days,  then take two tablets (40 mg) two times per day time three days,  then take two tablets (40 mg)once per day  60 tablet  1    Review of Systems  Constitutional: Negative for fever.  Gastrointestinal: Positive for abdominal pain.  Genitourinary:       + vaginal bleeding, discharge   Physical Exam   Blood pressure 127/64, pulse 63, temperature 98.2 F (36.8 C), temperature source Oral, resp. rate 20, height 5\' 5"  (1.651  m), weight 340 lb 2 oz (154.28 kg), last menstrual period 01/01/2014, SpO2 100.00%.  Physical Exam  Constitutional: She is oriented to person, place, and time. She appears well-developed and well-nourished.  Patient appears uncomfortable  HENT:  Head: Normocephalic and atraumatic.  Cardiovascular: Normal rate.   Respiratory: Effort normal.  GI: Soft.  Genitourinary:     Neurological: She is alert and oriented to person, place, and time.  Skin: Skin is warm and dry. No erythema.  Psychiatric: She has a normal mood and affect.   US Transvaginal Non-ob  01/03/2014   CLINICAL DATA:  Pelvic pain.  EXAM: TRANSVAGINAL ULTRASOUND OF PELVIS  TECHNIQUE: Transvaginal ultrasound examination of the pelvis was performed including evaluation of the uterus, ovaries, adnexal regions, and pelvic cul-de-sac.  COMPARISON:  Pelvic ultrasound 11/16/2013.   FINDINGS: Uterus  Measurements: 13.7 x 7.6 x 6.3 cm. No fibroids or other mass visualized.  Endometrium  Thickness: 22 mm.  No focal abnormality visualized.  Right ovary  Could not be visualized.  Left ovary  Measurements: 4.2 x 2.3 x 3.3 cm. Normal appearance/no adnexal mass.  Other findings:  No free fluid  IMPRESSION: 1. Limited examination demonstrating no definite acute findings. 2. Endometrial thickness is considered abnormal. Consider follow-up by [place] in 6-8 weeks, during the week immediately following menses (exam timing is critical).   Electronically Signed   By: Trudie Reed M.D.   On: 01/03/2014 02:48    MAU Course  Procedures None  MDM Called Dr. Shawnie Pons to come and examine the patient Dr. Shawnie Pons to MAU for exam. Unsure if tissue may be a polyp.  IV LR started on patient. 1 mg Dilaudid IV and 60 mg Toradol IM given prior to attempted extraction of tissue Patient appears more comfortable and tolerated exam well. Dr. Shawnie Pons was able to remove a copious amount of tissue from the vagina Dr. Shawnie Pons orders sample be sent for pathology and patient receive 800 mcg Cytotec to control bleeding. Observe bleeding prior to discharge.  0730 - patient continues to have bleeding. Small to moderate on pad. Patient continues to complain of pelvic pain although appears much more comfortable than on arrival. 1 mg Dilaudid given IV  Assessment and Plan  A: Abortion of uterine polyp vs tissue  P: Discharge home Bleeding precautions and warning signs for infection discussed Patient to follow-up in [facility] clinic on Monday, March 30th at 1:00 pm Patient may return to MAU as needed or if her condition were to change or worsen  Freddi Starr, PA-C  01/04/2014, 5:28 AM

## 2014-01-04 NOTE — Discharge Instructions (Signed)
Abnormal Uterine Bleeding Abnormal uterine bleeding means bleeding from the vagina that is not your normal menstrual period. This can be:  Bleeding or spotting between periods.  Bleeding after sex (sexual intercourse).  Bleeding that is heavier or more than normal.  Periods that last longer than usual.  Bleeding after menopause. There are many problems that may cause this. Treatment will depend on the cause of the bleeding. Any kind of bleeding that is not normal should be reviewed by your doctor.  HOME CARE Watch your condition for any changes. These actions may lessen any discomfort you are having:  Do not use tampons or douches as told by your doctor.  Change your pads often. You should get regular pelvic exams and Pap tests. Keep all appointments for tests as told by your doctor. GET HELP IF:  You are bleeding for more than 1 week.  You feel dizzy at times. GET HELP RIGHT AWAY IF:   You pass out.  You have to change pads every 15 to 30 minutes.  You have belly pain.  You have a fever.  You become sweaty or weak.  You are passing large blood clots from the vagina.  You feel sick to your stomach (nauseous) and throw up (vomit). MAKE SURE YOU:  Understand these instructions.  Will watch your condition.  Will get help right away if you are not doing well or get worse. Document Released: 07/27/2009 Document Revised: 07/20/2013 Document Reviewed: 04/28/2013 Exit[employer] Patient Information 2014 [employer].  

## 2014-01-04 NOTE — MAU Note (Addendum)
PT PRESENTS SAYING    AT 0430- SHE GOT UO TO B-ROOM- AND FELT SOMETHING HANGING OUT OF HER  VAGINA- ANDD SAYS SHE IS FEELING A LOT OF ABD PAIN.  TO RM 8-  ON  OBSERVATION- NOTHING BETWEEN LEGS COMING FROM HER VAGINA.-  NO BLEEDING    SAYS WAS HERE LAST NIGHT  FOR SAME PAIN.  SAYS LMP-  1-12 THROUGH  2-7

## 2014-01-05 NOTE — MAU Provider Note (Signed)
Attestation of Attending Supervision of Advanced Practitioner (PA/CNM/NP): Evaluation and management procedures were performed by the Advanced Practitioner under my supervision and collaboration.  I have reviewed the Advanced Practitioner's note and chart, and I agree with the management and plan.  Jameriah Trotti, MD, FACOG Attending Obstetrician & Gynecologist Faculty Practice, [facility]  

## 2014-01-07 ENCOUNTER — Other Ambulatory Visit: Payer: Self-pay | Admitting: Medical

## 2014-01-09 ENCOUNTER — Ambulatory Visit (INDEPENDENT_AMBULATORY_CARE_PROVIDER_SITE_OTHER): Payer: Self-pay | Admitting: Obstetrics & Gynecology

## 2014-01-09 ENCOUNTER — Encounter: Payer: Self-pay | Admitting: Obstetrics & Gynecology

## 2014-01-09 VITALS — BP 143/83 | HR 82 | Ht 66.0 in | Wt 341.1 lb

## 2014-01-09 DIAGNOSIS — R9389 Abnormal findings on diagnostic imaging of other specified body structures: Secondary | ICD-10-CM

## 2014-01-09 DIAGNOSIS — N926 Irregular menstruation, unspecified: Secondary | ICD-10-CM

## 2014-01-09 DIAGNOSIS — N939 Abnormal uterine and vaginal bleeding, unspecified: Secondary | ICD-10-CM | POA: Insufficient documentation

## 2014-01-09 MED ORDER — MEGESTROL ACETATE 40 MG PO TABS: ORAL_TABLET | ORAL | Status: DC

## 2014-01-09 MED ORDER — NAPROXEN SODIUM 550 MG PO TABS: 550.0000 mg | ORAL_TABLET | Freq: Two times a day (BID) | ORAL | Status: DC

## 2014-01-09 NOTE — Progress Notes (Signed)
Pt reports that she is still in pain. Ran out of percocet.

## 2014-01-09 NOTE — Patient Instructions (Addendum)

## 2014-01-09 NOTE — Progress Notes (Signed)
CLINIC ENCOUNTER NOTE  History:  28 y.o. G0P0 with known diagnoses of PCOS and morbid obesity here today for discussion about management of AUB management.  Ultrasound on 12/02/13 showed 31 mm endometrial thickness, and patient was initially treated with OCPs.  This did not work as her AUB continued and on 01/03/14 MAU visit for pelvic pain she had another ultrasound that showed 22 mm endometrial stripe. She was given pain medications but she returned to MAU on 01/04/14 with report of bleeding and passing some tissue.  A large amount of tissue was obtained on exam, pathology analysis showed it to be decidualized endometrial tissue.   She was told to follow up here in clinic.  She continues to have irregular bleeding, varying in amount, but can get very heavy sometimes. Scant current bleeding.  Denies any anemia symptoms, Hgb on 01/03/14 was 11.6. She wants to discuss other management options. She is accompanied by her mother today.  The following portions of the patient's history were reviewed and updated as appropriate: allergies, current medications, past family history, past medical history, past social history, past surgical history and problem list.  Normal pap in 11/11/12.  Review of Systems:  Pertinent items are noted in HPI.  Objective:  BP 143/83  Pulse 82  Ht 5\' 6"  (1.676 m)  Wt 341 lb 1.6 oz (154.722 kg)  BMI 55.08 kg/m2  LMP 01/01/2014 Physical Exam deferred  Labs and Imaging 01/04/14 Pathology Diagnosis - INFLAMED AND DEGENERATIVE DECIDUALIZED ENDOMETRIAL TISSUE, SEE COMMENT. - NO CHORIONIC VILLI (PRODUCTS OF CONCEPTION) IDENTIFIED.  01/03/2014   TRANSVAGINAL ULTRASOUND OF PELVIS CLINICAL DATA:  Pelvic pain.   TECHNIQUE: Transvaginal ultrasound examination of the pelvis was performed including evaluation of the uterus, ovaries, adnexal regions, and pelvic cul-de-sac.  COMPARISON:  Pelvic ultrasound 11/16/2013.  FINDINGS: Uterus  Measurements: 13.7 x 7.6 x 6.3 cm. No fibroids or  other mass visualized.  Endometrium  Thickness: 22 mm.  No focal abnormality visualized.  Right ovary  Could not be visualized.  Left ovary  Measurements: 4.2 x 2.3 x 3.3 cm. Normal appearance/no adnexal mass.  Other findings:  No free fluid  IMPRESSION: 1. Limited examination demonstrating no definite acute findings. 2. Endometrial thickness is considered abnormal. Consider follow-up by US in 6-8 weeks, during the week immediately following menses (exam timing is critical).   Electronically Signed   By: Trudie Reedaniel  Entrikin M.D.   On: 01/03/2014 02:48   12/02/2013 TRANSABDOMINAL AND TRANSVAGINAL ULTRASOUND OF PELVIS  CLINICAL DATA: Left pelvic pain, dysmenorrhea, obesity FINDINGS: Uterus easurements: 91 x 53 x 69 mm. No fibroids or other mass visualized. Endometrium  Thickness: Thickened to 3.1 cm. No focal abnormality visualized. Right ovary Not identified on pelvic or transvaginal scanning.  Left ovary Measurements: 29 x 19 x 17 mm. Normal appearance/no adnexal mass. Other findings Trace free fluid. IMPRESSION: 1. Abnormal endometrial thickening. Follow-up recommended to exclude endometrial pathology. Electronically Signed By: Oley Balmaniel Hassell M.D. On: 11/16/2013 21:32   Assessment & Plan:  Given thickened endometrial stripe in the setting of known PCOS and morbid obesity, there is increased concern about endometrial hyperplasia/neoplasia.  Pathology was contacted to reevaluate specimen given this concern.  Furthermore, recommended Hysteroscopy, D&C which could be therapeutic and diagnostic. Risks/benefits of procedure reviewed.  Recommended placement of Mirena IUD to help with endometrial protection and AUB, patient declines this at this point.  Discussed other progestin therapies, patient wants oral progestins, risks reviewed.  Megace prescribed, Anaprox DS also prescribed as needed for pain.  She was told that she will be contacted by our surgical scheduler regarding the time and date of her surgery.  She was  told she may be called for a preoperative appointment about a week prior to surgery and will be given further preoperative instructions at that visit. Printed patient education handouts about the procedure were given to the patient to review at home.  Bleeding precautions reviewed.     Jaynie Collins, MD, FACOG Attending Obstetrician & Gynecologist Faculty Practice, [facility]

## 2014-01-10 ENCOUNTER — Inpatient Hospital Stay (HOSPITAL_COMMUNITY)
Admission: AD | Admit: 2014-01-10 | Discharge: 2014-01-10 | Disposition: A | Discharge status: Home / Self Care | Payer: Self-pay | Source: Ambulatory Visit | Attending: Obstetrics & Gynecology | Admitting: Obstetrics & Gynecology

## 2014-01-10 ENCOUNTER — Encounter (HOSPITAL_COMMUNITY): Payer: Self-pay | Admitting: *Deleted

## 2014-01-10 DIAGNOSIS — J45909 Unspecified asthma, uncomplicated: Secondary | ICD-10-CM | POA: Insufficient documentation

## 2014-01-10 DIAGNOSIS — I10 Essential (primary) hypertension: Secondary | ICD-10-CM | POA: Insufficient documentation

## 2014-01-10 DIAGNOSIS — N926 Irregular menstruation, unspecified: Secondary | ICD-10-CM | POA: Insufficient documentation

## 2014-01-10 DIAGNOSIS — Z87891 Personal history of nicotine dependence: Secondary | ICD-10-CM | POA: Insufficient documentation

## 2014-01-10 DIAGNOSIS — R109 Unspecified abdominal pain: Secondary | ICD-10-CM | POA: Insufficient documentation

## 2014-01-10 DIAGNOSIS — N939 Abnormal uterine and vaginal bleeding, unspecified: Secondary | ICD-10-CM | POA: Insufficient documentation

## 2014-01-10 DIAGNOSIS — Z8249 Family history of ischemic heart disease and other diseases of the circulatory system: Secondary | ICD-10-CM | POA: Insufficient documentation

## 2014-01-10 LAB — CBC
HEMATOCRIT: 33.3 % — AB (ref 36.0–46.0)
HEMOGLOBIN: 10.8 g/dL — AB (ref 12.0–15.0)
MCH: 28.5 pg (ref 26.0–34.0)
MCHC: 32.4 g/dL (ref 30.0–36.0)
MCV: 87.9 fL (ref 78.0–100.0)
Platelets: 240 10*3/uL (ref 150–400)
RBC: 3.79 MIL/uL — ABNORMAL LOW (ref 3.87–5.11)
RDW: 13.1 % (ref 11.5–15.5)
WBC: 7.8 10*3/uL (ref 4.0–10.5)

## 2014-01-10 MED ORDER — LACTATED RINGERS IV BOLUS (SEPSIS): 1000.0000 mL | Freq: Once | INTRAVENOUS | Status: DC

## 2014-01-10 MED ORDER — OXYCODONE-ACETAMINOPHEN 5-325 MG PO TABS
2.0000 | ORAL_TABLET | Freq: Once | ORAL | Status: AC
  Administered 2014-01-10: 2 via ORAL
  Filled 2014-01-10: qty 2

## 2014-01-10 MED ORDER — OXYCODONE-ACETAMINOPHEN 5-325 MG PO TABS: 2.0000 | ORAL_TABLET | ORAL | Status: DC | PRN

## 2014-01-10 MED ORDER — MEGESTROL ACETATE 40 MG PO TABS
40.0000 mg | ORAL_TABLET | Freq: Every day | ORAL | Status: DC
  Administered 2014-01-10: 40 mg via ORAL
  Filled 2014-01-10 (×2): qty 1

## 2014-01-10 NOTE — MAU Note (Signed)
Pt states she is here for on going pain that she has had since about 3pm. Pt states she has had pain this bad for about 1 wk

## 2014-01-10 NOTE — MAU Provider Note (Signed)
History     CSN: [id number]  Arrival date and time: 01/10/14 1747   First Provider Initiated Contact with Patient 01/10/14 1823      Chief Complaint  Patient presents with  . Vaginal Bleeding   HPI  Pt is 28 yo not pregnant female with hx of PCO,AUB and seen in GYN clinic yesterday.Pt was seen in MAU on 2/20 and treated with OCs, then on 3/24 and again on 3/25 with tissue obtained - US showing abnormally thickened endometrium and had tissue sent to pathology revealing inflamed decidual endometrial tissue. Pt was seen in GYN clinic yesterday  Pt was prescribed Megace but pt went to [facility] and told it would cost her $60, which was too expensive. Pt's bleeding and pain has worsened today Pt took 4 Ibuprofen at 4:30pm and1 Percocet without relief of pain.  Pt was not able to get Megace due to expense last night. Pt has been bleeding heavily with clots and "gushing".  Pt is extreme to tears, esp on right side. Pt is to be scheduled for D&C and then have Mirena IUD   Past Medical History  Diagnosis Date  . PCOS (polycystic ovarian syndrome)   . Asthma   . Hypertension     had rx from hosp, ran out 2 yrs ago  . Obese   . Urinary tract infection   . Chlamydia   . Genital herpes   . Morbid obesity     Past Surgical History  Procedure Laterality Date  . Wisdom tooth extraction      Family History  Problem Relation Age of Onset  . Heart disease Mother   . Diabetes Father   . Heart disease Father   . Heart disease Maternal Grandmother   . Diabetes Maternal Grandfather   . Heart disease Maternal Grandfather   . Sleep apnea Mother     History  Substance Use Topics  . Smoking status: Former Smoker    Quit date: 01/04/2011  . Smokeless tobacco: Never Used     Comment: quit 2009  . Alcohol Use: Yes     Comment: occasonal     Allergies: No Known Allergies  Prescriptions prior to admission  Medication Sig Dispense Refill  . albuterol (PROVENTIL HFA;VENTOLIN HFA) 108  (90 BASE) MCG/ACT inhaler Inhale 2 puffs into the lungs every 6 (six) hours as needed (asthma).      . megestrol (MEGACE) 40 MG tablet Take two tablets three times daily for 3 days, then two tablets twice daily for 3 days, then two tablets daily for maintenance  90 tablet  3  . naproxen sodium (ANAPROX) 550 MG tablet Take 1 tablet (550 mg total) by mouth 2 (two) times daily with a meal.  60 tablet  10  . oxyCODONE-acetaminophen (PERCOCET/ROXICET) 5-325 MG per tablet Take 1-2 tablets by mouth every 4 (four) hours as needed for severe pain.  20 tablet  0  . Prenatal Vit-Fe Fumarate-FA (PRENATAL MULTIVITAMIN) TABS tablet Take 1 tablet by mouth daily at 12 noon.        Review of Systems  Constitutional: Negative for fever and chills.  Gastrointestinal: Positive for abdominal pain. Negative for nausea, vomiting, diarrhea and constipation.  Genitourinary: Negative for dysuria.   Physical Exam   Blood pressure 133/52, temperature 98.6 F (37 C), temperature source Oral, resp. rate 20, last menstrual period 01/01/2014.  Physical Exam  Vitals reviewed. Constitutional: She is oriented to person, place, and time. She appears well-developed and well-nourished.  Very uncomfortable, in  tears  HENT:  Head: Normocephalic.  Eyes: Pupils are equal, round, and reactive to light.  Neck: Normal range of motion.  Cardiovascular: Normal rate.   Respiratory: Effort normal.  GI: Soft. There is tenderness.  Genitourinary:  Small clots at introitus; small amount of bright red blood in vault; cervix closed, nullip  Musculoskeletal: Normal range of motion.  Neurological: She is alert and oriented to person, place, and time.  Skin: Skin is warm and dry.  Psychiatric: She has a normal mood and affect.    MAU Course  Procedures Results for orders placed during the hospital encounter of 01/10/14 (from the past 24 hour(s))  CBC     Status: Abnormal   Collection Time    01/10/14  6:18 PM      Result Value Ref  Range   WBC 7.8  4.0 - 10.5 K/uL   RBC 3.79 (*) 3.87 - 5.11 MIL/uL   Hemoglobin 10.8 (*) 12.0 - 15.0 g/dL   HCT 09.833.3 (*) 11.936.0 - 14.746.0 %   MCV 87.9  78.0 - 100.0 fL   MCH 28.5  26.0 - 34.0 pg   MCHC 32.4  30.0 - 36.0 g/dL   RDW 82.913.1  56.211.5 - 13.015.5 %   Platelets 240  150 - 400 K/uL  Hemoglobin 11.6 on 3/24 Megace 40mg  and Percocet 2 tablets and IVF given to pt with great improvement Discussed with Dr. Penne LashLeggett Assessment and Plan  [facility] Pt to get Megace already ordered and continue to take as directed Percocet #20 for pain  LINEBERRY,SUSAN 01/10/2014, 6:23 PM

## 2014-01-16 ENCOUNTER — Telehealth: Payer: Self-pay

## 2014-01-16 NOTE — Telephone Encounter (Signed)
Pt. Called stating she was prescribed megestrol 40mg  and has not stopped bleeding yet, wants to know if the medication is going to work. Called pt. Who stated she began taking the medication on April 1st, her bleeding has decreased but still not completely stopped. Advised pt. To continue taking medication as prescribed but to call next week if she reaches the maintenance dose and her bleeding has still not stopped. Pt. Verbalized understanding and gratitude and had no further questions or concerns.

## 2014-01-17 ENCOUNTER — Encounter (HOSPITAL_COMMUNITY): Payer: Self-pay | Admitting: Emergency Medicine

## 2014-01-17 ENCOUNTER — Emergency Department (HOSPITAL_COMMUNITY)
Admission: EM | Admit: 2014-01-17 | Discharge: 2014-01-17 | Disposition: A | Discharge status: Home / Self Care | Payer: Self-pay | Attending: Emergency Medicine | Admitting: Emergency Medicine

## 2014-01-17 DIAGNOSIS — Z87891 Personal history of nicotine dependence: Secondary | ICD-10-CM | POA: Insufficient documentation

## 2014-01-17 DIAGNOSIS — Z79899 Other long term (current) drug therapy: Secondary | ICD-10-CM | POA: Insufficient documentation

## 2014-01-17 DIAGNOSIS — Z87440 Personal history of urinary (tract) infections: Secondary | ICD-10-CM | POA: Insufficient documentation

## 2014-01-17 DIAGNOSIS — R102 Pelvic and perineal pain: Secondary | ICD-10-CM

## 2014-01-17 DIAGNOSIS — I10 Essential (primary) hypertension: Secondary | ICD-10-CM | POA: Insufficient documentation

## 2014-01-17 DIAGNOSIS — N920 Excessive and frequent menstruation with regular cycle: Secondary | ICD-10-CM | POA: Insufficient documentation

## 2014-01-17 DIAGNOSIS — Z8619 Personal history of other infectious and parasitic diseases: Secondary | ICD-10-CM | POA: Insufficient documentation

## 2014-01-17 DIAGNOSIS — J45909 Unspecified asthma, uncomplicated: Secondary | ICD-10-CM | POA: Insufficient documentation

## 2014-01-17 LAB — CBC WITH DIFFERENTIAL/PLATELET
BASOS ABS: 0 10*3/uL (ref 0.0–0.1)
BASOS PCT: 1 % (ref 0–1)
EOS PCT: 2 % (ref 0–5)
Eosinophils Absolute: 0.1 10*3/uL (ref 0.0–0.7)
HCT: 33 % — ABNORMAL LOW (ref 36.0–46.0)
Hemoglobin: 10.6 g/dL — ABNORMAL LOW (ref 12.0–15.0)
LYMPHS ABS: 3.4 10*3/uL (ref 0.7–4.0)
Lymphocytes Relative: 55 % — ABNORMAL HIGH (ref 12–46)
MCH: 27.2 pg (ref 26.0–34.0)
MCHC: 32.1 g/dL (ref 30.0–36.0)
MCV: 84.8 fL (ref 78.0–100.0)
Monocytes Absolute: 0.5 10*3/uL (ref 0.1–1.0)
Monocytes Relative: 7 % (ref 3–12)
Neutro Abs: 2.2 10*3/uL (ref 1.7–7.7)
Neutrophils Relative %: 35 % — ABNORMAL LOW (ref 43–77)
PLATELETS: 327 10*3/uL (ref 150–400)
RBC: 3.89 MIL/uL (ref 3.87–5.11)
RDW: 12.9 % (ref 11.5–15.5)
WBC: 6.2 10*3/uL (ref 4.0–10.5)

## 2014-01-17 LAB — BASIC METABOLIC PANEL
BUN: 5 mg/dL — ABNORMAL LOW (ref 6–23)
CALCIUM: 8.9 mg/dL (ref 8.4–10.5)
CO2: 20 mEq/L (ref 19–32)
Chloride: 107 mEq/L (ref 96–112)
Creatinine, Ser: 0.7 mg/dL (ref 0.50–1.10)
GFR calc Af Amer: >90 mL/min (ref >90)
GFR calc non Af Amer: >90 mL/min (ref >90)
GLUCOSE: 90 mg/dL (ref 70–99)
Potassium: 3.7 mEq/L (ref 3.7–5.3)
Sodium: 138 mEq/L (ref 137–147)

## 2014-01-17 LAB — HCG, SERUM, QUALITATIVE: Preg, Serum: NEGATIVE

## 2014-01-17 MED ORDER — KETOROLAC TROMETHAMINE 30 MG/ML IJ SOLN
30.0000 mg | Freq: Once | INTRAMUSCULAR | Status: AC
  Administered 2014-01-17: 30 mg via INTRAVENOUS
  Filled 2014-01-17: qty 1

## 2014-01-17 MED ORDER — HYDROMORPHONE HCL PF 1 MG/ML IJ SOLN
1.0000 mg | Freq: Once | INTRAMUSCULAR | Status: AC
  Administered 2014-01-17: 1 mg via INTRAVENOUS
  Filled 2014-01-17: qty 1

## 2014-01-17 MED ORDER — OXYCODONE-ACETAMINOPHEN 5-325 MG PO TABS: 1.0000 | ORAL_TABLET | ORAL | Status: DC | PRN

## 2014-01-17 NOTE — ED Notes (Signed)
Pt reports [facility] 3[facility]25, and 3/31. Pt reports abdominal and pelvic pain. Dull, stabbing, cramping pain from vaginal area. Reports vaginal bleeding. Pain 10/10.

## 2014-01-17 NOTE — ED Provider Notes (Signed)
CSN: [id number]     Arrival date & time 01/17/14  1139 History   First MD Initiated Contact with Patient 01/17/14 1204     Chief Complaint  Patient presents with  . Abdominal Pain  . Pelvic Pain   HPI The patient presents to the emergency room with complaints of persistent abdominal/pelvic pain associated with vaginal bleeding. The patient has been having very heavy vaginal bleeding ongoing for at least a couple of weeks. Associated with this heavy vaginal bleeding she said pain in her lower abdomen and vaginal area. The pain is sharp and severe and at times is pressure-like. It is both in the lower abdomen left and right side and at times more severe in the right. She has not had any fevers or vomiting. [facility] the same condition. She was seen on March 23, March 25 and March 31. Patient states her symptoms have persisted. She is taking Megace and has noted some improvement of her bleeding but the pain is no better. Patient is worried about her right ovary. When she had the ultrasound the other day the right ovary was not visualized.  She has not had any fevers. She has not had any diarrhea. Past Medical History  Diagnosis Date  . PCOS (polycystic ovarian syndrome)   . Asthma   . Hypertension     had rx from hosp, ran out 2 yrs ago  . Obese   . Urinary tract infection   . Chlamydia   . Genital herpes   . Morbid obesity    Past Surgical History  Procedure Laterality Date  . Wisdom tooth extraction     Family History  Problem Relation Age of Onset  . Heart disease Mother   . Diabetes Father   . Heart disease Father   . Heart disease Maternal Grandmother   . Diabetes Maternal Grandfather   . Heart disease Maternal Grandfather   . Sleep apnea Mother    History  Substance Use Topics  . Smoking status: Former Smoker    Quit date: 01/04/2011  . Smokeless tobacco: Never Used     Comment: quit 2009  . Alcohol Use: Yes     Comment:  occasonal    OB History   Grav Para Term Preterm Abortions TAB SAB Ect Mult Living   0              Review of Systems  All other systems reviewed and are negative.      Allergies  Review of patient's allergies indicates no known allergies.  Home Medications   Current Outpatient Rx  Name  Route  Sig  Dispense  Refill  . ibuprofen (ADVIL,MOTRIN) 200 MG tablet   Oral   Take 800 mg by mouth every 6 (six) hours as needed for moderate pain or cramping.         . megestrol (MEGACE) 40 MG tablet   Oral   Take 40 mg by mouth 2 (two) times daily.         Marland Kitchen. albuterol (PROVENTIL HFA;VENTOLIN HFA) 108 (90 BASE) MCG/ACT inhaler   Inhalation   Inhale 2 puffs into the lungs every 6 (six) hours as needed (asthma).         . oxyCODONE-acetaminophen (PERCOCET/ROXICET) 5-325 MG per tablet   Oral   Take 1-2 tablets by mouth every 4 (four) hours as needed for severe pain.   30 tablet   0    BP 135/63  Pulse 71  Temp(Src) 98.2 F (36.8 C) (Oral)  Resp 20  SpO2 99%  LMP 01/01/2014 Physical Exam  Nursing note and vitals reviewed. Constitutional:  Morbidly obese, uncomfortable appearing  HENT:  Head: Normocephalic and atraumatic.  Right Ear: External ear normal.  Left Ear: External ear normal.  Eyes: Conjunctivae are normal. Right eye exhibits no discharge. Left eye exhibits no discharge. No scleral icterus.  Neck: Neck supple. No tracheal deviation present.  Cardiovascular: Normal rate, regular rhythm and intact distal pulses.   Pulmonary/Chest: Effort normal and breath sounds normal. No stridor. No respiratory distress. She has no wheezes. She has no rales.  Abdominal: Soft. Bowel sounds are normal. She exhibits no distension. There is tenderness in the suprapubic area. There is no rebound and no guarding.  Genitourinary: Uterus is tender. Cervix exhibits motion tenderness. Cervix exhibits no discharge. Right adnexum displays tenderness. Right adnexum displays no fullness.  Left adnexum displays tenderness. Left adnexum displays no fullness. There is bleeding around the vagina. No signs of injury around the vagina.  Musculoskeletal: She exhibits no edema and no tenderness.  Neurological: She is alert. She has normal strength. No cranial nerve deficit (no facial droop, extraocular movements intact, no slurred speech) or sensory deficit. She exhibits normal muscle tone. She displays no seizure activity. Coordination normal.  Skin: Skin is warm and dry. No rash noted.  Psychiatric: She has a normal mood and affect.    ED Course  Procedures (including critical care time) Labs Review Labs Reviewed  CBC WITH DIFFERENTIAL - Abnormal; Notable for the following:    Hemoglobin 10.6 (*)    HCT 33.0 (*)    Neutrophils Relative % 35 (*)    Lymphocytes Relative 55 (*)    All other components within normal limits  BASIC METABOLIC PANEL - Abnormal; Notable for the following:    BUN 5 (*)    All other components within normal limits  GC/CHLAMYDIA PROBE AMP  HCG, SERUM, QUALITATIVE   Imaging Review No results found. EXAM:  TRANSVAGINAL ULTRASOUND OF PELVIS  TECHNIQUE:  Transvaginal ultrasound examination of the pelvis was performed  including evaluation of the uterus, ovaries, adnexal regions, and  pelvic cul-de-sac.  COMPARISON: Pelvic ultrasound 11/16/2013.  FINDINGS:  Uterus  Measurements: 13.7 x 7.6 x 6.3 cm. No fibroids or other mass  visualized.  Endometrium  Thickness: 22 mm. No focal abnormality visualized.  Right ovary  Could not be visualized.  Left ovary  Measurements: 4.2 x 2.3 x 3.3 cm. Normal appearance/no adnexal mass.  Other findings: No free fluid  IMPRESSION:  1. Limited examination demonstrating no definite acute findings.  2. Endometrial thickness is considered abnormal. Consider follow-up  by [place] in 6-8 weeks, during the week immediately following menses  (exam timing is critical).  Electronically Signed  By: Trudie Reed M.D.  On:  01/03/2014 02:48  Medications  ketorolac (TORADOL) 30 MG/ML injection 30 mg (30 mg Intravenous Given 01/17/14 1251)  HYDROmorphone (DILAUDID) injection 1 mg (1 mg Intravenous Given 01/17/14 1251)  1525  patient was able to walk to the bathroom without difficulty. She is feeling better.  MDM   Final diagnoses:  Menorrhagia  Pelvic pain    I doubt ovarian torsion. Patient primarily has uterine tenderness on exam. Her symptoms are consistent with her persistent menorrhagia in dysfunctional uterine bleeding. Recommended she follow up with her gynecologist for further treatment. We'll discharge the patient home with a prescription for pain medications.   Celene Kras, MD 01/17/14 [id number]

## 2014-01-17 NOTE — Discharge Instructions (Signed)
Abdominal Pain, Adult Many things can cause abdominal pain. Usually, abdominal pain is not caused by a disease and will improve without treatment. It can often be observed and treated at home. Your health care provider will do a physical exam and possibly order blood tests and X-rays to help determine the seriousness of your pain. However, in many cases, more time must pass before a clear cause of the pain can be found. Before that point, your health care provider may not know if you need more testing or further treatment. HOME CARE INSTRUCTIONS  Monitor your abdominal pain for any changes. The following actions may help to alleviate any discomfort you are experiencing:  Only take over-the-counter or prescription medicines as directed by your health care provider.  Do not take laxatives unless directed to do so by your health care provider.  Try a clear liquid diet (broth, tea, or water) as directed by your health care provider. Slowly move to a bland diet as tolerated. SEEK MEDICAL CARE IF:  You have unexplained abdominal pain.  You have abdominal pain associated with nausea or diarrhea.  You have pain when you urinate or have a bowel movement.  You experience abdominal pain that wakes you in the night.  You have abdominal pain that is worsened or improved by eating food.  You have abdominal pain that is worsened with eating fatty foods. SEEK IMMEDIATE MEDICAL CARE IF:   Your pain does not go away within 2 hours.  You have a fever.  You keep throwing up (vomiting).  Your pain is felt only in portions of the abdomen, such as the right side or the left lower portion of the abdomen.  You pass bloody or black tarry stools. MAKE SURE YOU:  Understand these instructions.   Will watch your condition.   Will get help right away if you are not doing well or get worse.  Document Released: 07/09/2005 Document Revised: 07/20/2013 Document Reviewed: 06/08/2013 [facility] Patient  Information 2014 [place]  Abnormal Uterine Bleeding Abnormal uterine bleeding can affect women at various stages in life, including teenagers, women in their reproductive years, pregnant women, and women who have reached menopause. Several kinds of uterine bleeding are considered abnormal, including:  Bleeding or spotting between periods.   Bleeding after sexual intercourse.   Bleeding that is heavier or more than normal.   Periods that last longer than usual.  Bleeding after menopause.  Many cases of abnormal uterine bleeding are minor and simple to treat, while others are more serious. Any type of abnormal bleeding should be evaluated by your health care provider. Treatment will depend on the cause of the bleeding. HOME CARE INSTRUCTIONS Monitor your condition for any changes. The following actions may help to alleviate any discomfort you are experiencing:  Avoid the use of tampons and douches as directed by your health care provider.  Change your pads frequently. You should get regular pelvic exams and Pap tests. Keep all follow-up appointments for diagnostic tests as directed by your health care provider.  SEEK MEDICAL CARE IF:   Your bleeding lasts more than 1 week.   You feel dizzy at times.  SEEK IMMEDIATE MEDICAL CARE IF:   You pass out.   You are changing pads every 15 to 30 minutes.   You have abdominal pain.  You have a fever.   You become sweaty or weak.   You are passing large blood clots from the vagina.   You start to feel nauseous and vomit.  MAKE SURE YOU:   Understand these instructions.  Will watch your condition.  Will get help right away if you are not doing well or get worse. Document Released: 09/29/2005 Document Revised: 06/01/2013 Document Reviewed: 04/28/2013 [facility] Patient Information 2014 [place], [place].

## 2014-01-17 NOTE — Progress Notes (Signed)
[facility] provided pt with a list of primary care resources. Patient stated that he was pending [employer] insurance.  °

## 2014-01-18 LAB — GC/CHLAMYDIA PROBE AMP
CT Probe RNA: NEGATIVE
GC Probe RNA: NEGATIVE

## 2014-01-23 ENCOUNTER — Telehealth: Payer: Self-pay | Admitting: General Practice

## 2014-01-23 DIAGNOSIS — N938 Other specified abnormal uterine and vaginal bleeding: Secondary | ICD-10-CM

## 2014-01-23 MED ORDER — MEGESTROL ACETATE 40 MG PO TABS: 80.0000 mg | ORAL_TABLET | Freq: Two times a day (BID) | ORAL | Status: DC

## 2014-01-23 NOTE — Telephone Encounter (Signed)
Patient called and left message stating she started taking the megace 40mg  on April 1, two pills once daily and she is still bleeding, it has slowed down some but she is still bleeding and having pains from the cramping and last time she took the megace it stopped her bleeding but it hasn't stopped this time. Spoke to Dr Macon LargeAnyanwu, gave verbal order for Megace 40mg  two pills BID disp 90 with 5 refills. Called patient and phone stated the subscriber is not available or is out of coverage zone. Will send patient mychart message.

## 2014-01-26 NOTE — MAU Provider Note (Signed)
Attestation of Attending Supervision of Advanced Practitioner (CNM/NP): Evaluation and management procedures were performed by the Advanced Practitioner under my supervision and collaboration. I have reviewed the Advanced Practitioner's note and chart, and I agree with the management and plan.  Lori DukesKelly H Sofia Vanmeter 5:45 AM

## 2014-02-07 ENCOUNTER — Encounter: Payer: Self-pay | Admitting: Obstetrics & Gynecology

## 2014-02-08 ENCOUNTER — Telehealth: Payer: Self-pay | Admitting: *Deleted

## 2014-02-08 NOTE — Telephone Encounter (Signed)
Called [place] and we discussed her complaints- I informed her I thought it was better to discuss on phone versus my chart emails.  She reports she has ran out of the megace and doesn't have the money right now to get more. States megace has helped her bleeding - some days only spotting, sometimes  Wakes up with bleeding in bed.She wanted to know how long before the bleeding came back.   We discussed we cannot  Just move up her surgery as it is considered elective , not emergency - but I would notifiy the scheduler if there was a cancellation to contact her.  We also discussed she has applied for medicaid- denied- and [facility]. [facility]- I encouraged her to get the medicine asap and restart asap so that her bleeding would not return- I informed her I cannot say when her bleeding will return- but we want to avoid that.  We discussed if bleeding is too heavy to contact us or go MAU. I also notified her of ultrasound appointment moved to 02/14/14.    I called OB/GYN office and notified them of her request to move surgery up if cancelllation,.

## 2014-02-08 NOTE — Telephone Encounter (Signed)
Juliette MangleKelly Powell Rassette, RN [facility] Clinical Pool            Please move U/S appointment before surgery date of 03/21/14 and notify patient.  Thanks,  Tresa EndoKelly      Previous Messages      ----- Message -----  From: Pennie BanterMarni W Mcknight  Sent: 01/16/2014 9:14 AM  To: Juliette MangleKelly Powell Rassette, RN, *  Subject: RE: Please schedule surgery - Clinic patient   Posted for 03/21/14 at 1:00pm with Dr. Macon LargeAnyanwu  CPT [phone]   She has transvag ultrasound scheduled for 6/16, this will need to be moved up to prior to surgery date.  ----- Message -----  From: Tereso NewcomerUgonna A Anyanwu, MD  Sent: 01/10/2014 11:04 AM  To: Lori Mcknight  Subject: Please schedule surgery - Clinic patient   Hysteroscopy, D&C for AUB, morbid obesity   No insurance, waiting to apply for Medicaid/financial application   Not urgent.   UAA

## 2014-02-14 ENCOUNTER — Ambulatory Visit (HOSPITAL_COMMUNITY)
Admission: RE | Admit: 2014-02-14 | Discharge: 2014-02-14 | Disposition: A | Discharge status: Home / Self Care | Payer: Self-pay | Source: Ambulatory Visit | Attending: Obstetrics & Gynecology | Admitting: Obstetrics & Gynecology

## 2014-02-14 DIAGNOSIS — R9389 Abnormal findings on diagnostic imaging of other specified body structures: Secondary | ICD-10-CM | POA: Insufficient documentation

## 2014-02-14 DIAGNOSIS — N938 Other specified abnormal uterine and vaginal bleeding: Secondary | ICD-10-CM | POA: Insufficient documentation

## 2014-02-14 DIAGNOSIS — N949 Unspecified condition associated with female genital organs and menstrual cycle: Secondary | ICD-10-CM | POA: Insufficient documentation

## 2014-02-14 DIAGNOSIS — N925 Other specified irregular menstruation: Secondary | ICD-10-CM | POA: Insufficient documentation

## 2014-02-17 NOTE — Telephone Encounter (Signed)
Opened in error

## 2014-02-21 ENCOUNTER — Encounter: Payer: Self-pay | Admitting: Obstetrics & Gynecology

## 2014-02-22 ENCOUNTER — Telehealth: Payer: Self-pay

## 2014-02-22 NOTE — Telephone Encounter (Signed)
Patient sent message through Mychart asking why she had an appointment on 03/20/14. Spoke to Dr. Erin FullingHarraway-Smith who reviewed pt.'s chart and stated pt. Does not need to be seen in clinic prior to surgery however she will need to have a preop appointment and will be called with that appointment (should have appointment 1-2 weeks prior to surgery). Sent pt. Mychart message informing her of this as well as called her to reiterate information. Advised pt. That if she does not hear about pre-op appointment by week leading up to surgery she should call surgical scheduler. Pt. Verbalized understanding. No questions or concerns.

## 2014-03-14 ENCOUNTER — Other Ambulatory Visit: Payer: Self-pay | Admitting: Obstetrics & Gynecology

## 2014-03-20 ENCOUNTER — Encounter (HOSPITAL_COMMUNITY): Payer: Self-pay

## 2014-03-20 ENCOUNTER — Ambulatory Visit: Payer: Self-pay | Admitting: Obstetrics & Gynecology

## 2014-03-20 ENCOUNTER — Telehealth (HOSPITAL_COMMUNITY): Payer: Self-pay | Admitting: Obstetrics & Gynecology

## 2014-03-20 ENCOUNTER — Encounter (HOSPITAL_COMMUNITY): Payer: Self-pay | Admitting: Anesthesiology

## 2014-03-20 ENCOUNTER — Encounter (HOSPITAL_COMMUNITY)
Admission: RE | Admit: 2014-03-20 | Discharge: 2014-03-20 | Disposition: A | Discharge status: Home / Self Care | Payer: Self-pay | Source: Ambulatory Visit | Attending: Obstetrics & Gynecology | Admitting: Obstetrics & Gynecology

## 2014-03-20 DIAGNOSIS — Z01818 Encounter for other preprocedural examination: Secondary | ICD-10-CM | POA: Insufficient documentation

## 2014-03-20 DIAGNOSIS — Z01812 Encounter for preprocedural laboratory examination: Secondary | ICD-10-CM | POA: Insufficient documentation

## 2014-03-20 HISTORY — DX: Shortness of breath: R06.02

## 2014-03-20 LAB — CBC
HCT: 38.4 % (ref 36.0–46.0)
Hemoglobin: 12.2 g/dL (ref 12.0–15.0)
MCH: 25 pg — ABNORMAL LOW (ref 26.0–34.0)
MCHC: 31.8 g/dL (ref 30.0–36.0)
MCV: 78.7 fL (ref 78.0–100.0)
Platelets: 257 10*3/uL (ref 150–400)
RBC: 4.88 MIL/uL (ref 3.87–5.11)
RDW: 15.9 % — ABNORMAL HIGH (ref 11.5–15.5)
WBC: 7.7 10*3/uL (ref 4.0–10.5)

## 2014-03-20 LAB — TYPE AND SCREEN
ABO/RH(D): O POS
Antibody Screen: NEGATIVE

## 2014-03-20 LAB — BASIC METABOLIC PANEL
BUN: 9 mg/dL (ref 6–23)
CO2: 21 mEq/L (ref 19–32)
Calcium: 9.5 mg/dL (ref 8.4–10.5)
Chloride: 105 mEq/L (ref 96–112)
Creatinine, Ser: 0.78 mg/dL (ref 0.50–1.10)
GFR calc Af Amer: >90 mL/min (ref >90)
GFR calc non Af Amer: >90 mL/min (ref >90)
Glucose, Bld: 152 mg/dL — ABNORMAL HIGH (ref 70–99)
Potassium: 3.7 mEq/L (ref 3.7–5.3)
Sodium: 139 mEq/L (ref 137–147)

## 2014-03-20 LAB — ABO/RH: ABO/RH(D): O POS

## 2014-03-20 NOTE — Pre-Procedure Instructions (Signed)
LMOM for [facility] in OB/GYN that pt wants IUD insertion added to surgery.

## 2014-03-20 NOTE — Patient Instructions (Addendum)
Your procedure is scheduled on:03/21/14  Enter through the Main Entrance at :1130 am Pick up desk phone and dial [phone] and inform us of your arrival.  Please call [phone] if you have any problems the morning of surgery.  Remember: Do not eat food after midnight:tonight Clear liquids are ok until: 9am on Tuesday   You may brush your teeth the morning of surgery.   DO NOT wear jewelry, eye make-up, lipstick,body lotion, or dark fingernail polish.  (Polished toes are ok) You may wear deodorant.  If you are to be admitted after surgery, leave suitcase in car until your room has been assigned. Patients discharged on the day of surgery will not be allowed to drive home. Wear loose fitting, comfortable clothes for your ride home.

## 2014-03-20 NOTE — Anesthesia Preprocedure Evaluation (Deleted)
Anesthesia Evaluation  Patient identified by MRN, date of birth, ID band Patient awake    Reviewed: Allergy & Precautions, H&P , Patient's Chart, lab work & pertinent test results  Airway Mallampati: II TM Distance: >3 FB Neck ROM: Full    Dental no notable dental hx. (+) Teeth Intact   Pulmonary shortness of breath, asthma , former smoker,  breath sounds clear to auscultation        Cardiovascular hypertension, Rhythm:Regular Rate:Normal     Neuro/Psych negative neurological ROS  negative psych ROS   GI/Hepatic negative GI ROS, Neg liver ROS,   Endo/Other  Morbid obesityPCOS  Renal/GU negative Renal ROS  negative genitourinary   Musculoskeletal negative musculoskeletal ROS (+)   Abdominal (+) + obese,   Peds  Hematology  (+) anemia ,   Anesthesia Other Findings   Reproductive/Obstetrics AUB                           Anesthesia Physical Anesthesia Plan  ASA: III  Anesthesia Plan: General   Post-op Pain Management:    Induction: Intravenous  Airway Management Planned: LMA  Additional Equipment:   Intra-op Plan:   Post-operative Plan: Extubation in OR  Informed Consent: I have reviewed the patients History and Physical, chart, labs and discussed the procedure including the risks, benefits and alternatives for the proposed anesthesia with the patient or authorized representative who has indicated his/her understanding and acceptance.   Dental advisory given  Plan Discussed with: CRNA, Anesthesiologist and Surgeon  Anesthesia Plan Comments:         Anesthesia Quick Evaluation

## 2014-03-20 NOTE — Telephone Encounter (Signed)
Call left on voice mail from Lori Mcknight that patient had notified preadmission nurse that she wanted to cancel her surgery and wanted to have the IUD placement instead.  O.R. Notified of cancellation.  Clinic notified to schedule appointment for patient for IUD placement.

## 2014-03-21 ENCOUNTER — Encounter (HOSPITAL_COMMUNITY): Admission: RE | Payer: Self-pay | Source: Ambulatory Visit

## 2014-03-21 ENCOUNTER — Other Ambulatory Visit: Payer: Self-pay | Admitting: Obstetrics & Gynecology

## 2014-03-21 ENCOUNTER — Ambulatory Visit (HOSPITAL_COMMUNITY): Admission: RE | Admit: 2014-03-21 | Payer: Self-pay | Source: Ambulatory Visit | Admitting: Obstetrics & Gynecology

## 2014-03-21 ENCOUNTER — Telehealth: Payer: Self-pay | Admitting: *Deleted

## 2014-03-21 DIAGNOSIS — G8929 Other chronic pain: Secondary | ICD-10-CM

## 2014-03-21 DIAGNOSIS — R102 Pelvic and perineal pain: Principal | ICD-10-CM

## 2014-03-21 SURGERY — DILATATION AND CURETTAGE /HYSTEROSCOPY
Anesthesia: General

## 2014-03-21 MED ORDER — NAPROXEN 500 MG PO TABS: 500.0000 mg | ORAL_TABLET | Freq: Two times a day (BID) | ORAL | Status: DC

## 2014-03-21 NOTE — Telephone Encounter (Signed)
[place] came by to fill out paperwork for Mirena IUD as instructed by Dr. Macon Large- filled out form, will bring proof of financial income soon. Explained will fax application when we get her proof of income.  [place] teary, stating she is confused- surgery was cancelled and she did not want it cancelled. States wants surgery instead of IUD. Explained to her per chart was canceled at her request and what I could do was let Dr. Macon Large know that she still wants to get the surgery ( D& C/ hysteroscopy) and we will contact her once there is information. As she understands it she is supposed to get the surgery and the  IUD afterwards.

## 2014-03-28 ENCOUNTER — Ambulatory Visit (HOSPITAL_COMMUNITY): Payer: Self-pay

## 2014-04-04 ENCOUNTER — Encounter (HOSPITAL_COMMUNITY): Payer: Self-pay

## 2014-04-04 ENCOUNTER — Encounter (HOSPITAL_COMMUNITY)
Admission: RE | Admit: 2014-04-04 | Discharge: 2014-04-04 | Disposition: A | Discharge status: Home / Self Care | Payer: Self-pay | Source: Ambulatory Visit | Attending: Obstetrics & Gynecology | Admitting: Obstetrics & Gynecology

## 2014-04-04 ENCOUNTER — Encounter (HOSPITAL_COMMUNITY): Payer: Self-pay | Admitting: Pharmacist

## 2014-04-04 DIAGNOSIS — Z01812 Encounter for preprocedural laboratory examination: Secondary | ICD-10-CM | POA: Insufficient documentation

## 2014-04-04 HISTORY — DX: Chest pain, unspecified: R07.9

## 2014-04-04 HISTORY — DX: Other chest pain: R07.89

## 2014-04-04 HISTORY — DX: Anemia, unspecified: D64.9

## 2014-04-04 NOTE — Patient Instructions (Addendum)
Your procedure is scheduled on:04/06/13  Enter through the Main Entrance at :1245pm Pick up desk phone and dial [phone] and inform us of your arrival.  Please call [phone] if you have any problems the morning of surgery.  Remember: Do not eat food after midnight:WED Clear liquids are ok until:9am on 04/06/13   You may brush your teeth the morning of surgery.   Bring inhaler to hospital on day of surgery. DO NOT wear jewelry, eye make-up, lipstick,body lotion, or dark fingernail polish.  (Polished toes are ok) You may wear deodorant.  If you are to be admitted after surgery, leave suitcase in car until your room has been assigned. Patients discharged on the day of surgery will not be allowed to drive home. Wear loose fitting, comfortable clothes for your ride home.

## 2014-04-04 NOTE — Pre-Procedure Instructions (Signed)
  Labs done on 03/20/14 are ok for surgery on 04/06/14 per Dr. Rodman Pickleassidy.

## 2014-04-06 ENCOUNTER — Encounter (HOSPITAL_COMMUNITY): Payer: Self-pay | Admitting: Anesthesiology

## 2014-04-06 ENCOUNTER — Encounter (HOSPITAL_COMMUNITY)
Admission: RE | Disposition: A | Discharge status: Home / Self Care | Payer: Self-pay | Source: Ambulatory Visit | Attending: Obstetrics & Gynecology

## 2014-04-06 ENCOUNTER — Ambulatory Visit (HOSPITAL_COMMUNITY): Payer: Self-pay | Admitting: Anesthesiology

## 2014-04-06 ENCOUNTER — Encounter (HOSPITAL_COMMUNITY): Payer: Self-pay | Admitting: *Deleted

## 2014-04-06 ENCOUNTER — Ambulatory Visit (HOSPITAL_COMMUNITY)
Admission: RE | Admit: 2014-04-06 | Discharge: 2014-04-06 | Disposition: A | Discharge status: Home / Self Care | Payer: Self-pay | Source: Ambulatory Visit | Attending: Obstetrics & Gynecology | Admitting: Obstetrics & Gynecology

## 2014-04-06 DIAGNOSIS — E282 Polycystic ovarian syndrome: Secondary | ICD-10-CM | POA: Insufficient documentation

## 2014-04-06 DIAGNOSIS — R9389 Abnormal findings on diagnostic imaging of other specified body structures: Secondary | ICD-10-CM | POA: Insufficient documentation

## 2014-04-06 DIAGNOSIS — I10 Essential (primary) hypertension: Secondary | ICD-10-CM | POA: Insufficient documentation

## 2014-04-06 DIAGNOSIS — Z87891 Personal history of nicotine dependence: Secondary | ICD-10-CM | POA: Insufficient documentation

## 2014-04-06 DIAGNOSIS — N938 Other specified abnormal uterine and vaginal bleeding: Secondary | ICD-10-CM | POA: Insufficient documentation

## 2014-04-06 DIAGNOSIS — N926 Irregular menstruation, unspecified: Secondary | ICD-10-CM

## 2014-04-06 DIAGNOSIS — Z6841 Body Mass Index (BMI) 40.0 and over, adult: Secondary | ICD-10-CM | POA: Insufficient documentation

## 2014-04-06 DIAGNOSIS — N939 Abnormal uterine and vaginal bleeding, unspecified: Secondary | ICD-10-CM | POA: Diagnosis present

## 2014-04-06 DIAGNOSIS — N949 Unspecified condition associated with female genital organs and menstrual cycle: Secondary | ICD-10-CM | POA: Insufficient documentation

## 2014-04-06 HISTORY — PX: HYSTEROSCOPY WITH D & C: SHX1775

## 2014-04-06 LAB — PREGNANCY, URINE: Preg Test, Ur: NEGATIVE

## 2014-04-06 SURGERY — DILATATION AND CURETTAGE /HYSTEROSCOPY
Anesthesia: General | Site: Vagina

## 2014-04-06 MED ORDER — LACTATED RINGERS IV SOLN
INTRAVENOUS | Status: DC
Start: 2014-04-06 — End: 2014-04-06
  Administered 2014-04-06 (×2): via INTRAVENOUS

## 2014-04-06 MED ORDER — BUPIVACAINE HCL 0.5 % IJ SOLN
INTRAMUSCULAR | Status: DC | PRN
  Administered 2014-04-06: 30 mL

## 2014-04-06 MED ORDER — FENTANYL CITRATE 0.05 MG/ML IJ SOLN
INTRAMUSCULAR | Status: AC
  Filled 2014-04-06: qty 5

## 2014-04-06 MED ORDER — IBUPROFEN 800 MG PO TABS: 800.0000 mg | ORAL_TABLET | Freq: Three times a day (TID) | ORAL | Status: DC | PRN

## 2014-04-06 MED ORDER — DEXAMETHASONE SODIUM PHOSPHATE 4 MG/ML IJ SOLN
INTRAMUSCULAR | Status: DC | PRN
  Administered 2014-04-06: 10 mg via INTRAVENOUS

## 2014-04-06 MED ORDER — BUPIVACAINE HCL (PF) 0.5 % IJ SOLN
INTRAMUSCULAR | Status: AC
  Filled 2014-04-06: qty 30

## 2014-04-06 MED ORDER — PROMETHAZINE HCL 25 MG/ML IJ SOLN
INTRAMUSCULAR | Status: AC
  Administered 2014-04-06: 12.5 mg via INTRAVENOUS
  Filled 2014-04-06: qty 1

## 2014-04-06 MED ORDER — PROMETHAZINE HCL 25 MG/ML IJ SOLN
6.2500 mg | INTRAMUSCULAR | Status: DC | PRN
  Administered 2014-04-06: 12.5 mg via INTRAVENOUS

## 2014-04-06 MED ORDER — OXYCODONE-ACETAMINOPHEN 5-325 MG PO TABS: 1.0000 | ORAL_TABLET | Freq: Four times a day (QID) | ORAL | Status: DC | PRN

## 2014-04-06 MED ORDER — PROPOFOL INFUSION 10 MG/ML OPTIME
INTRAVENOUS | Status: DC | PRN
  Administered 2014-04-06: 400 mL via INTRAVENOUS

## 2014-04-06 MED ORDER — PROPOFOL 10 MG/ML IV EMUL
INTRAVENOUS | Status: AC
  Filled 2014-04-06: qty 20

## 2014-04-06 MED ORDER — KETOROLAC TROMETHAMINE 30 MG/ML IJ SOLN
INTRAMUSCULAR | Status: DC | PRN
  Administered 2014-04-06: 30 mg via INTRAVENOUS

## 2014-04-06 MED ORDER — FENTANYL CITRATE 0.05 MG/ML IJ SOLN
INTRAMUSCULAR | Status: DC | PRN
  Administered 2014-04-06 (×5): 50 ug via INTRAVENOUS

## 2014-04-06 MED ORDER — KETOROLAC TROMETHAMINE 30 MG/ML IJ SOLN: 15.0000 mg | Freq: Once | INTRAMUSCULAR | Status: DC | PRN

## 2014-04-06 MED ORDER — MEPERIDINE HCL 25 MG/ML IJ SOLN: 6.2500 mg | INTRAMUSCULAR | Status: DC | PRN

## 2014-04-06 MED ORDER — OXYCODONE-ACETAMINOPHEN 5-325 MG PO TABS
ORAL_TABLET | ORAL | Status: AC
  Administered 2014-04-06: 2 via ORAL
  Filled 2014-04-06: qty 2

## 2014-04-06 MED ORDER — OXYCODONE-ACETAMINOPHEN 5-325 MG PO TABS
2.0000 | ORAL_TABLET | Freq: Once | ORAL | Status: AC
  Administered 2014-04-06: 2 via ORAL

## 2014-04-06 MED ORDER — LACTATED RINGERS IV SOLN: INTRAVENOUS | Status: DC

## 2014-04-06 MED ORDER — FENTANYL CITRATE 0.05 MG/ML IJ SOLN: 25.0000 ug | INTRAMUSCULAR | Status: DC | PRN

## 2014-04-06 MED ORDER — DEXAMETHASONE SODIUM PHOSPHATE 10 MG/ML IJ SOLN
INTRAMUSCULAR | Status: AC
  Filled 2014-04-06: qty 1

## 2014-04-06 MED ORDER — GLYCOPYRROLATE 0.2 MG/ML IJ SOLN
INTRAMUSCULAR | Status: DC | PRN
  Administered 2014-04-06: 0.2 mg via INTRAVENOUS

## 2014-04-06 MED ORDER — MIDAZOLAM HCL 2 MG/2ML IJ SOLN
INTRAMUSCULAR | Status: DC | PRN
  Administered 2014-04-06: 2 mg via INTRAVENOUS

## 2014-04-06 MED ORDER — MIDAZOLAM HCL 2 MG/2ML IJ SOLN
INTRAMUSCULAR | Status: AC
  Filled 2014-04-06: qty 2

## 2014-04-06 MED ORDER — LIDOCAINE HCL (CARDIAC) 20 MG/ML IV SOLN
INTRAVENOUS | Status: AC
  Filled 2014-04-06: qty 5

## 2014-04-06 MED ORDER — MIDAZOLAM HCL 2 MG/2ML IJ SOLN: 0.5000 mg | Freq: Once | INTRAMUSCULAR | Status: DC | PRN

## 2014-04-06 MED ORDER — LIDOCAINE HCL (CARDIAC) 20 MG/ML IV SOLN
INTRAVENOUS | Status: DC | PRN
  Administered 2014-04-06: 100 mg via INTRAVENOUS

## 2014-04-06 MED ORDER — GLYCINE 1.5 % IR SOLN
Status: DC | PRN
  Administered 2014-04-06: 3000 mL

## 2014-04-06 MED ORDER — ONDANSETRON HCL 4 MG/2ML IJ SOLN
INTRAMUSCULAR | Status: DC | PRN
  Administered 2014-04-06: 4 mg via INTRAVENOUS

## 2014-04-06 MED ORDER — ONDANSETRON HCL 4 MG/2ML IJ SOLN
INTRAMUSCULAR | Status: AC
  Filled 2014-04-06: qty 2

## 2014-04-06 SURGICAL SUPPLY — 17 items
CANISTER SUCT 3000ML (MISCELLANEOUS) ×2 IMPLANT
CATH ROBINSON RED A/P 16FR (CATHETERS) ×2 IMPLANT
CLOTH BEACON ORANGE TIMEOUT ST (SAFETY) ×2 IMPLANT
CONTAINER PREFILL 10% NBF 60ML (FORM) ×4 IMPLANT
DRAPE HYSTEROSCOPY (DRAPE) ×2 IMPLANT
DRAPE LG THREE QUARTER DISP (DRAPES) ×4 IMPLANT
ELECT LOOP GYNE PRO 24FR (CUTTING LOOP)
ELECTRODE LOOP GYNE PRO 24FR (CUTTING LOOP) IMPLANT
GLOVE ECLIPSE 7.0 STRL STRAW (GLOVE) ×2 IMPLANT
GOWN STRL REUS W/TWL LRG LVL3 (GOWN DISPOSABLE) ×4 IMPLANT
LOOP ANGLED CUTTING 22FR (CUTTING LOOP) IMPLANT
PACK VAGINAL MINOR WOMEN LF (CUSTOM PROCEDURE TRAY) ×2 IMPLANT
PAD OB MATERNITY 4.3X12.25 (PERSONAL CARE ITEMS) ×2 IMPLANT
SET TUBING HYSTEROSCOPY 2 NDL (TUBING) ×1 IMPLANT
TOWEL OR 17X24 6PK STRL BLUE (TOWEL DISPOSABLE) ×4 IMPLANT
TUBE HYSTEROSCOPY W Y-CONNECT (TUBING) ×1 IMPLANT
WATER STERILE IRR 1000ML POUR (IV SOLUTION) ×2 IMPLANT

## 2014-04-06 NOTE — Anesthesia Preprocedure Evaluation (Signed)
Anesthesia Evaluation  Patient identified by MRN, date of birth, ID band Patient awake    Reviewed: Allergy & Precautions, H&P , Patient's Chart, lab work & pertinent test results, reviewed documented beta blocker date and time   History of Anesthesia Complications Negative for: history of anesthetic complications  Airway Mallampati: IV TM Distance: >3 FB Neck ROM: full    Dental   Pulmonary shortness of breath, asthma , former smoker,  breath sounds clear to auscultation        Cardiovascular Exercise Tolerance: Good hypertension, Rhythm:regular Rate:Normal     Neuro/Psych negative psych ROS   GI/Hepatic   Endo/Other  Morbid obesity  Renal/GU      Musculoskeletal   Abdominal   Peds  Hematology  (+) anemia ,   Anesthesia Other Findings   Reproductive/Obstetrics                           Anesthesia Physical Anesthesia Plan  ASA: III  Anesthesia Plan: General LMA   Post-op Pain Management:    Induction:   Airway Management Planned:   Additional Equipment:   Intra-op Plan:   Post-operative Plan:   Informed Consent: I have reviewed the patients History and Physical, chart, labs and discussed the procedure including the risks, benefits and alternatives for the proposed anesthesia with the patient or authorized representative who has indicated his/her understanding and acceptance.   Dental Advisory Given  Plan Discussed with: CRNA, Surgeon and Anesthesiologist  Anesthesia Plan Comments:         Anesthesia Quick Evaluation

## 2014-04-06 NOTE — Discharge Instructions (Addendum)
DO NOT TAKE IBUPROFEN (ADVIL, ALEVE OR MOTRIN) TILL AFTER 8:45 PM!  Hysteroscopy, Care After  Refer to this sheet in the next few weeks. These instructions provide you with information on caring for yourself after your procedure. Your health care provider may also give you more specific instructions. Your treatment has been planned according to current medical practices, but problems sometimes occur. Call your health care provider if you have any problems or questions after your procedure.   WHAT TO EXPECT AFTER THE PROCEDURE After your procedure, it is typical to have the following:  You may have some cramping. This normally lasts for a couple days.  You may have bleeding. This can vary from light spotting for a few days to menstrual-like bleeding for 3-7 days.  HOME CARE INSTRUCTIONS  Rest for the first 1-2 days after the procedure.  Only take over-the-counter or prescription medicines as directed by your health care provider. Do not take aspirin. It can increase the chances of bleeding.  Take showers instead of baths for 2 weeks or as directed by your health care provider.  Do not drive for 24 hours or as directed.  Do not drink alcohol while taking pain medicine.  Do not use tampons, douche, or have sexual intercourse for 2 weeks or until your health care provider says it is okay.  Take your temperature twice a day for 4-5 days. Write it down each time.  Follow your health care provider's advice about diet, exercise, and lifting.  If you develop constipation, you may:  Take a mild laxative if your health care provider approves.  Add bran foods to your diet.  Drink enough fluids to keep your urine clear or pale yellow.  Try to have someone with you or available to you for the first 24-48 hours, especially if you were given a general anesthetic.  Follow up with your health care provider as directed.  SEEK MEDICAL CARE IF:  You feel dizzy or lightheaded.  You feel sick  to your stomach (nauseous).  You have abnormal vaginal discharge.  You have a rash.  You have pain that is not controlled with medicine.  SEEK IMMEDIATE MEDICAL CARE IF:  You have bleeding that is heavier than a normal menstrual period.  You have a fever.  You have increasing cramps or pain, not controlled with medicine.  You have new belly (abdominal) pain.  You pass out.  You have pain in the tops of your shoulders (shoulder strap areas).  You have shortness of breath.  Document Released: 07/20/2013 Document Reviewed: 07/20/2013

## 2014-04-06 NOTE — Anesthesia Postprocedure Evaluation (Signed)
  Anesthesia Post Note  Patient: Lori Mcknight  Procedure(s) Performed: Procedure(s) (LRB): DILATATION AND CURETTAGE /HYSTEROSCOPY  (N/A)  Anesthesia type: GA  Patient location: PACU  Post pain: Pain level controlled  Post assessment: Post-op Vital signs reviewed  Last Vitals:  Filed Vitals:   04/06/14 1545  BP: 151/86  Pulse: 82  Temp:   Resp: 20    Post vital signs: Reviewed  Level of consciousness: sedated  Complications: No apparent anesthesia complications

## 2014-04-06 NOTE — Anesthesia Procedure Notes (Signed)
Procedure Name: LMA Insertion Date/Time: 04/06/2014 2:20 PM Performed by: Graciela HusbandsFUSSELL, WYNN O Pre-anesthesia Checklist: Patient identified, Patient being monitored, Timeout performed, Emergency Drugs available and Suction available Patient Re-evaluated:Patient Re-evaluated prior to inductionOxygen Delivery Method: Circle system utilized Preoxygenation: Pre-oxygenation with 100% oxygen Intubation Type: IV induction Ventilation: Mask ventilation without difficulty LMA: LMA inserted LMA Size: 4.0 Number of attempts: 1 Airway Equipment and Method: Patient positioned with wedge pillow (Positioned on 11 folded bath blankets + foam head cradle.) Placement Confirmation: breath sounds checked- equal and bilateral and positive ETCO2 Tube secured with: Tape Dental Injury: Teeth and Oropharynx as per pre-operative assessment

## 2014-04-06 NOTE — Op Note (Signed)
PREOPERATIVE DIAGNOSIS:  Abnormal uterine bleeding, morbid obesity, abnormal endometrium on ultrasound POSTOPERATIVE DIAGNOSIS: The same PROCEDURE: Hysteroscopy, Dilation and Curettage. SURGEON:  Dr. [name]   INDICATIONS: 28 y.o. G0P0  here for scheduled surgery for the aforementioned diagnoses.   Risks of surgery were discussed with the patient including but not limited to: bleeding which may require transfusion; infection which may require antibiotics; injury to uterus or surrounding organs; intrauterine scarring which may impair future fertility; need for additional procedures including laparotomy or laparoscopy; and other postoperative/anesthesia complications. Written informed consent was obtained.    FINDINGS:  A 11 week size uterus.  Diffuse proliferative endometrium.  Normal ostia bilaterally.  ANESTHESIA:   General - LMA, paracervical block with 30 ml of 0.5% Marcaine INTRAVENOUS FLUIDS:  1200 ml of LR FLUID DEFICITS:  100 ml of LR ESTIMATED BLOOD LOSS:  Less than 20 ml SPECIMENS: Endometrial curettings sent to pathology COMPLICATIONS:  None immediate.  PROCEDURE DETAILS:  The patient received intravenous antibiotics while in the preoperative area.  She was then taken to the operating room where general anesthesia was administered and was found to be adequate.  After an adequate timeout was performed, she was placed in the dorsal lithotomy position and examined; then prepped and draped in the sterile manner.   Her bladder was catheterized for an unmeasured amount of clear, yellow urine. A speculum was then placed in the patient's vagina and a single tooth tenaculum was applied to the anterior lip of the cervix.   A paracervical block using 30 ml of 0.5% Marcaine was administered.  The cervix was sounded to 11 cm and dilated manually with metal dilators to accommodate the 7 mm diagnostic hysteroscope.  Once the cervix was dilated, the hysteroscope was inserted under direct  visualization using glycine as a suspension medium.  The uterine cavity was carefully examined with the findings as noted above.   After further careful visualization of the uterine cavity, the hysteroscope was removed under direct visualization.  A sharp curettage was then performed to obtain a moderate amount of endometrial curettings.  The tenaculum was removed from the anterior lip of the cervix and the vaginal speculum was removed after noting good hemostasis.  The patient tolerated the procedure well and was taken to the recovery area awake, extubated and in stable condition.  The patient will be discharged to home as per PACU criteria.  Routine postoperative instructions given.  She was prescribed Percocet, Ibuprofen and Colace.  She will follow up in the clinic on 05/01/14  for postoperative evaluation.   Jaynie CollinsUGONNA  ANYANWU, MD, FACOG Attending Obstetrician & Gynecologist Faculty Practice, [facility]

## 2014-04-06 NOTE — Transfer of Care (Signed)
Immediate Anesthesia Transfer of Care Note  Patient: Lori Mcknight  Procedure(s) Performed: Procedure(s): DILATATION AND CURETTAGE /HYSTEROSCOPY  (N/A)  Patient Location: PACU  Anesthesia Type:General  Level of Consciousness: awake, alert  and oriented  Airway & Oxygen Therapy: Patient Spontanous Breathing and Patient connected to nasal cannula oxygen  Post-op Assessment: Report given to PACU RN and Post -op Vital signs reviewed and stable  Post vital signs: Reviewed and stable  Complications: No apparent anesthesia complications

## 2014-04-06 NOTE — H&P (Signed)
Preoperative History and Physical  Lori Mcknight is a 28 y.o. G0P0 here for surgical evaluation of AUB in the setting of morbid obesity and thickened endometrium.  01/03/2014 TRANSVAGINAL ULTRASOUND OF PELVIS CLINICAL DATA: Pelvic pain. TECHNIQUE: Transvaginal ultrasound examination of the pelvis was performed including evaluation of the uterus, ovaries, adnexal regions, and pelvic cul-de-sac. COMPARISON: Pelvic ultrasound 11/16/2013. FINDINGS: Uterus Measurements: 13.7 x 7.6 x 6.3 cm. No fibroids or other mass visualized. Endometrium Thickness: 22 mm. No focal abnormality visualized. Right ovary Could not be visualized. Left ovary Measurements: 4.2 x 2.3 x 3.3 cm. Normal appearance/no adnexal mass. Other findings: No free fluid IMPRESSION: 1. Limited examination demonstrating no definite acute findings. 2. Endometrial thickness is considered abnormal. Consider follow-up by US in 6-8 weeks, during the week immediately following menses (exam timing is critical). Electronically Signed By: Trudie Reedaniel Entrikin M.D. On: 01/03/2014 02:48   Proposed surgery: Hysteroscopy, D&C  Past Medical History  Diagnosis Date  . PCOS (polycystic ovarian syndrome)   . Asthma   . Obese   . Urinary tract infection   . Chlamydia   . Genital herpes   . Morbid obesity   . Shortness of breath     on exertion from weight  . Hypertension     no meds since April  . Chest pain of uncertain etiology     intermittent left side chest pain  . Anemia     history of anemia   Past Surgical History  Procedure Laterality Date  . Wisdom tooth extraction    . No past surgeries     OB History   Grav Para Term Preterm Abortions TAB SAB Ect Mult Living   0              Patient denies any cervical dysplasia or STIs. Prescriptions prior to admission  Medication Sig Dispense Refill  . albuterol (PROVENTIL HFA;VENTOLIN HFA) 108 (90 BASE) MCG/ACT inhaler Inhale 2 puffs into the lungs every 6 (six) hours as needed (asthma).      .  ibuprofen (ADVIL,MOTRIN) 200 MG tablet Take 800 mg by mouth every 6 (six) hours as needed for moderate pain or cramping.        No Known Allergies Social History:   reports that she quit smoking about 3 years ago. She has never used smokeless tobacco. She reports that she drinks alcohol. She reports that she does not use illicit drugs. Family History  Problem Relation Age of Onset  . Heart disease Mother   . Diabetes Father   . Heart disease Father   . Heart disease Maternal Grandmother   . Diabetes Maternal Grandfather   . Heart disease Maternal Grandfather   . Sleep apnea Mother     Review of Systems: Noncontributory  PHYSICAL EXAM: Blood pressure 145/87, pulse 90, temperature 98.6 F (37 C), temperature source Oral, resp. rate 20, last menstrual period 12/29/2013, SpO2 100.00%. General appearance - alert, well appearing, and in no distress Chest - clear to auscultation, no wheezes, rales or rhonchi, symmetric air entry Heart - normal rate and regular rhythm Abdomen - soft, obese, nontender, nondistended, no masses or organomegaly Pelvic - examination not indicated Extremities - peripheral pulses normal, no pedal edema, no clubbing or cyanosis  Labs: Results for orders placed during the hospital encounter of 04/06/14 (from the past 336 hour(s))  PREGNANCY, URINE   Collection Time    04/06/14 12:30 PM      Result Value Ref Range   Preg Test, Ur NEGATIVE  NEGATIVE    Assessment: Patient Active Problem List   Diagnosis Date Noted  . Abnormal uterine bleeding (AUB) 01/09/2014  . Endometrial thickening on ultra sound 12/02/2013  . Hypertension 10/21/2013  . Trichomonal vaginitis 11/11/2012  . PCOS (polycystic ovarian syndrome) 11/06/2011  . Obesity, Class III, BMI 40-49.9 (morbid obesity) 11/06/2011  . Infertility, female 11/06/2011  . Pelvic pain in female 08/01/2011    Plan: Patient will undergo hysteroscopy, D&C.   The risks of surgery were discussed in detail with  the patient including but not limited to: bleeding, infection, injury to uterus or surrounding organs, need for additional procedures including laparoscopy or laparotomy and other postoperative/anesthesia complications. Likelihood of success in alleviating the patient's condition was discussed. Routine postoperative instructions will be reviewed with the patient and her family in detail after surgery.  The patient concurred with the proposed plan, giving informed written consent for the surgery.  Patient has been NPO since last night she will remain NPO for procedure.  Anesthesia and OR aware.  Preoperative prophylactic antibiotics and SCDs ordered on call to the OR.  To OR when ready.  Jaynie CollinsUGONNA  ANYANWU, M.D. 04/06/2014 1:59 PM

## 2014-04-08 ENCOUNTER — Encounter (HOSPITAL_COMMUNITY): Payer: Self-pay | Admitting: Obstetrics & Gynecology

## 2014-04-10 ENCOUNTER — Encounter (HOSPITAL_COMMUNITY): Payer: Self-pay | Admitting: *Deleted

## 2014-04-10 ENCOUNTER — Inpatient Hospital Stay (HOSPITAL_COMMUNITY)
Admission: AD | Admit: 2014-04-10 | Discharge: 2014-04-10 | Disposition: A | Discharge status: Home / Self Care | Payer: Self-pay | Source: Ambulatory Visit | Attending: Obstetrics & Gynecology | Admitting: Obstetrics & Gynecology

## 2014-04-10 ENCOUNTER — Telehealth: Payer: Self-pay | Admitting: *Deleted

## 2014-04-10 DIAGNOSIS — E282 Polycystic ovarian syndrome: Secondary | ICD-10-CM | POA: Insufficient documentation

## 2014-04-10 DIAGNOSIS — R109 Unspecified abdominal pain: Secondary | ICD-10-CM | POA: Insufficient documentation

## 2014-04-10 DIAGNOSIS — N939 Abnormal uterine and vaginal bleeding, unspecified: Secondary | ICD-10-CM

## 2014-04-10 DIAGNOSIS — I10 Essential (primary) hypertension: Secondary | ICD-10-CM | POA: Insufficient documentation

## 2014-04-10 DIAGNOSIS — N926 Irregular menstruation, unspecified: Secondary | ICD-10-CM

## 2014-04-10 DIAGNOSIS — D649 Anemia, unspecified: Secondary | ICD-10-CM | POA: Insufficient documentation

## 2014-04-10 DIAGNOSIS — Z87891 Personal history of nicotine dependence: Secondary | ICD-10-CM | POA: Insufficient documentation

## 2014-04-10 LAB — CBC
HCT: 39.2 % (ref 36.0–46.0)
HEMOGLOBIN: 12.5 g/dL (ref 12.0–15.0)
MCH: 25 pg — ABNORMAL LOW (ref 26.0–34.0)
MCHC: 31.9 g/dL (ref 30.0–36.0)
MCV: 78.4 fL (ref 78.0–100.0)
Platelets: 276 10*3/uL (ref 150–400)
RBC: 5 MIL/uL (ref 3.87–5.11)
RDW: 17.4 % — ABNORMAL HIGH (ref 11.5–15.5)
WBC: 9.8 10*3/uL (ref 4.0–10.5)

## 2014-04-10 LAB — URINALYSIS, ROUTINE W REFLEX MICROSCOPIC
Bilirubin Urine: NEGATIVE
GLUCOSE, UA: NEGATIVE mg/dL
Hgb urine dipstick: NEGATIVE
KETONES UR: NEGATIVE mg/dL
LEUKOCYTES UA: NEGATIVE
Nitrite: NEGATIVE
PH: 5.5 (ref 5.0–8.0)
Protein, ur: NEGATIVE mg/dL
Specific Gravity, Urine: 1.01 (ref 1.005–1.030)
Urobilinogen, UA: 0.2 mg/dL (ref 0.0–1.0)

## 2014-04-10 MED ORDER — LACTATED RINGERS IV BOLUS (SEPSIS)
250.0000 mL | Freq: Once | INTRAVENOUS | Status: AC
  Administered 2014-04-10: 250 mL via INTRAVENOUS

## 2014-04-10 MED ORDER — HYDROMORPHONE HCL PF 1 MG/ML IJ SOLN
1.0000 mg | Freq: Once | INTRAMUSCULAR | Status: DC
  Filled 2014-04-10: qty 1

## 2014-04-10 MED ORDER — HYDROMORPHONE HCL PF 1 MG/ML IJ SOLN
1.0000 mg | Freq: Once | INTRAMUSCULAR | Status: AC
  Administered 2014-04-10: 1 mg via INTRAVENOUS

## 2014-04-10 MED ORDER — KETOROLAC TROMETHAMINE 30 MG/ML IJ SOLN
30.0000 mg | Freq: Once | INTRAMUSCULAR | Status: DC
  Filled 2014-04-10: qty 1

## 2014-04-10 MED ORDER — KETOROLAC TROMETHAMINE 10 MG PO TABS: 10.0000 mg | ORAL_TABLET | Freq: Four times a day (QID) | ORAL | Status: DC | PRN

## 2014-04-10 MED ORDER — IBUPROFEN 200 MG PO TABS: 800.0000 mg | ORAL_TABLET | Freq: Four times a day (QID) | ORAL | Status: DC | PRN

## 2014-04-10 MED ORDER — ESTROGENS CONJUGATED 25 MG IJ SOLR
25.0000 mg | Freq: Once | INTRAMUSCULAR | Status: AC
  Administered 2014-04-10: 25 mg via INTRAVENOUS
  Filled 2014-04-10: qty 25

## 2014-04-10 MED ORDER — KETOROLAC TROMETHAMINE 30 MG/ML IJ SOLN
30.0000 mg | Freq: Once | INTRAMUSCULAR | Status: AC
  Administered 2014-04-10: 30 mg via INTRAVENOUS

## 2014-04-10 NOTE — Telephone Encounter (Signed)
Went to contact patient and noticed patient was listed as a patient in the MAU.  Will call on the next day.

## 2014-04-10 NOTE — Telephone Encounter (Signed)
Message copied by Dorothyann PengHAIZLIP, CANDACE E on Mon Apr 10, 2014  3:37 PM ------      Message from: Tereso NewcomerANYANWU, UGONNA A      Created: Sat Apr 08, 2014  9:25 AM       Benign endometrial pathology. Please call to inform patient of results. Will await receipt of Mirena IUD and place in office. ------

## 2014-04-10 NOTE — Discharge Instructions (Signed)
Abnormal Uterine Bleeding Abnormal uterine bleeding can affect women at various stages in life, including teenagers, women in their reproductive years, pregnant women, and women who have reached menopause. Several kinds of uterine bleeding are considered abnormal, including:  Bleeding or spotting between periods.   Bleeding after sexual intercourse.   Bleeding that is heavier or more than normal.   Periods that last longer than usual.  Bleeding after menopause.  Many cases of abnormal uterine bleeding are minor and simple to treat, while others are more serious. Any type of abnormal bleeding should be evaluated by your health care provider. Treatment will depend on the cause of the bleeding. HOME CARE INSTRUCTIONS Monitor your condition for any changes. The following actions may help to alleviate any discomfort you are experiencing:  Avoid the use of tampons and douches as directed by your health care provider.  Change your pads frequently. You should get regular pelvic exams and Pap tests. Keep all follow-up appointments for diagnostic tests as directed by your health care provider.  SEEK MEDICAL CARE IF:   Your bleeding lasts more than 1 week.   You feel dizzy at times.  SEEK IMMEDIATE MEDICAL CARE IF:   You pass out.   You are changing pads every 15 to 30 minutes.   You have abdominal pain.  You have a fever.   You become sweaty or weak.   You are passing large blood clots from the vagina.   You start to feel nauseous and vomit. MAKE SURE YOU:   Understand these instructions.  Will watch your condition.  Will get help right away if you are not doing well or get worse. Document Released: 09/29/2005 Document Revised: 10/04/2013 Document Reviewed: 04/28/2013 ExitCare Patient Information 2015 [employer]. This information is not intended to replace advice given to you by your health care provider. Make sure you discuss any questions you have with your  health care provider.  

## 2014-04-10 NOTE — MAU Provider Note (Signed)
None     Chief Complaint:  Abdominal Pain and Vaginal Bleeding   Lori Mcknight is  28 y.o. G0P0.  Patient's last menstrual period was 12/29/2013.Marland Kitchen.   Patient states she had a D & C / hysteroscopy on 6-25 for AUB. States she has had some pain since she left the hospital, but getting worse. Since last night, she has had a constant pain, crampy in nature, with waves of cramps.  It is made worse by movement, urination and with bowel movements. Had a normal sized BM with a normal consistancy this morning. Took 2 percocet around 0900, no ibuprofen since last night.  Pt received IV toradol/dilaudid with relief.  Also received a dose of IV premarin to help stop bleeding.    Past Medical History  Diagnosis Date  . PCOS (polycystic ovarian syndrome)   . Asthma   . Obese   . Urinary tract infection   . Chlamydia   . Genital herpes   . Morbid obesity   . Shortness of breath     on exertion from weight  . Hypertension     no meds since April  . Chest pain of uncertain etiology     intermittent left side chest pain  . Anemia     history of anemia    Past Surgical History  Procedure Laterality Date  . Wisdom tooth extraction    . No past surgeries    . Hysteroscopy w/d&c N/A 04/06/2014    Procedure: DILATATION AND CURETTAGE /HYSTEROSCOPY ;  Surgeon: Tereso NewcomerUgonna A Anyanwu, MD;  Location: [facility];  Service: Gynecology;  Laterality: N/A;  . Dilation and curettage of uterus      Family History  Problem Relation Age of Onset  . Heart disease Mother   . Diabetes Father   . Heart disease Father   . Heart disease Maternal Grandmother   . Diabetes Maternal Grandfather   . Heart disease Maternal Grandfather   . Sleep apnea Mother     History  Substance Use Topics  . Smoking status: Former Smoker    Quit date: 01/04/2011  . Smokeless tobacco: Never Used     Comment: quit 2009  . Alcohol Use: Yes     Comment: occasonal     Allergies: No Known Allergies  Prescriptions prior to admission   Medication Sig Dispense Refill  . albuterol (PROVENTIL HFA;VENTOLIN HFA) 108 (90 BASE) MCG/ACT inhaler Inhale 2 puffs into the lungs every 6 (six) hours as needed (asthma).      . ibuprofen (ADVIL,MOTRIN) 200 MG tablet Take 800 mg by mouth every 6 (six) hours as needed for moderate pain or cramping.      Marland Kitchen. oxyCODONE-acetaminophen (PERCOCET/ROXICET) 5-325 MG per tablet Take 1 tablet by mouth every 6 (six) hours as needed for severe pain.        Review of Systems   Constitutional: Negative for fever and chills Eyes: Negative for visual disturbances Respiratory: Negative for shortness of breath, dyspnea Cardiovascular: Negative for chest pain or palpitations  Gastrointestinal: Negative for vomiting, diarrhea and constipation Genitourinary: Negative for dysuria and urgency Musculoskeletal: Negative for back pain, joint pain, myalgias  Neurological: Negative for dizziness and headaches     Physical Exam   Blood pressure 126/105, pulse 110, temperature 98.5 F (36.9 C), temperature source Oral, resp. rate 24, height 5\' 5"  (1.651 m), weight 152.953 kg (337 lb 3.2 oz), last menstrual period 12/29/2013, SpO2 100.00%.  General: General appearance - oriented to person, place, and time, overweight,  in mild to moderate distress and crying Chest - clear to auscultation, no wheezes, rales or rhonchi, symmetric air entry Heart - normal rate and regular rhythm Abdomen - tenderness noted lower abdominal area.  Pt is morbidly obese Pelvic - SSE:  Small amount of bright red blood coming from cervical os. Cx non friable Extremities - no pedal edema noted   Labs: Results for orders placed during the hospital encounter of 04/10/14 (from the past 24 hour(s))  URINALYSIS, ROUTINE W REFLEX MICROSCOPIC   Collection Time    04/10/14  2:05 PM      Result Value Ref Range   Color, Urine YELLOW  YELLOW   APPearance HAZY (*) CLEAR   Specific Gravity, Urine 1.010  1.005 - 1.030   pH 5.5  5.0 - 8.0    Glucose, UA NEGATIVE  NEGATIVE mg/dL   Hgb urine dipstick NEGATIVE  NEGATIVE   Bilirubin Urine NEGATIVE  NEGATIVE   Ketones, ur NEGATIVE  NEGATIVE mg/dL   Protein, ur NEGATIVE  NEGATIVE mg/dL   Urobilinogen, UA 0.2  0.0 - 1.0 mg/dL   Nitrite NEGATIVE  NEGATIVE   Leukocytes, UA NEGATIVE  NEGATIVE  CBC   Collection Time    04/10/14  2:40 PM      Result Value Ref Range   WBC 9.8  4.0 - 10.5 K/uL   RBC 5.00  3.87 - 5.11 MIL/uL   Hemoglobin 12.5  12.0 - 15.0 g/dL   HCT 65.739.2  84.636.0 - 96.246.0 %   MCV 78.4  78.0 - 100.0 fL   MCH 25.0 (*) 26.0 - 34.0 pg   MCHC 31.9  30.0 - 36.0 g/dL   RDW 95.217.4 (*) 84.111.5 - 32.415.5 %   Platelets 276  150 - 400 K/uL  Endometrium, curettage 04/07/14: - WEAKLY SECRETORY TO INACTIVE ENDOMETRIUM WITH PSEUDODECIDUALIZED STROMA, CONSISTENT WITH HORMONE EFFECT. - NO HYPERPLASIA OR MALIGNANCY. Valinda HoarJULIA MANNY MD Pathologist, Electronic Signature    Assessment: Patient Active Problem List   Diagnosis Date Noted  . Abnormal uterine bleeding (AUB) 01/09/2014  . Endometrial thickening on ultra sound 12/02/2013  . Hypertension 10/21/2013  . Trichomonal vaginitis 11/11/2012  . PCOS (polycystic ovarian syndrome) 11/06/2011  . Obesity, Class III, BMI 40-49.9 (morbid obesity) 11/06/2011  . Infertility, female 11/06/2011  . Pelvic pain in female 08/01/2011   No evidence of post op complication  Plan: Discussed with Dr. Debroah LoopArnold  Resume megace until July 20 appt Consider Lysteda if periods ever become regular for dysmenorrhea (wants to be pregnant)  CRESENZO-DISHMAN,Danelly Hassinger

## 2014-04-10 NOTE — MAU Note (Signed)
States she started bleeding yesterday, some clots

## 2014-04-10 NOTE — MAU Note (Signed)
Patient states she had a D & C / hysteroscopy on 6-25. States she has had some pain since she left the hospital, but getting worse. Hurts with movement, urination and with bowel movements.

## 2014-04-12 NOTE — Telephone Encounter (Signed)
Called patient and informed her of results. Patient verbalized understanding and had no questions. 

## 2014-04-20 ENCOUNTER — Encounter (HOSPITAL_COMMUNITY): Payer: Self-pay | Admitting: *Deleted

## 2014-04-20 ENCOUNTER — Inpatient Hospital Stay (HOSPITAL_COMMUNITY)
Admission: AD | Admit: 2014-04-20 | Discharge: 2014-04-20 | Disposition: A | Discharge status: Home / Self Care | Payer: Self-pay | Source: Ambulatory Visit | Attending: Obstetrics & Gynecology | Admitting: Obstetrics & Gynecology

## 2014-04-20 DIAGNOSIS — Z8249 Family history of ischemic heart disease and other diseases of the circulatory system: Secondary | ICD-10-CM | POA: Insufficient documentation

## 2014-04-20 DIAGNOSIS — E282 Polycystic ovarian syndrome: Secondary | ICD-10-CM | POA: Insufficient documentation

## 2014-04-20 DIAGNOSIS — R109 Unspecified abdominal pain: Secondary | ICD-10-CM | POA: Insufficient documentation

## 2014-04-20 DIAGNOSIS — B009 Herpesviral infection, unspecified: Secondary | ICD-10-CM | POA: Insufficient documentation

## 2014-04-20 DIAGNOSIS — N949 Unspecified condition associated with female genital organs and menstrual cycle: Secondary | ICD-10-CM | POA: Insufficient documentation

## 2014-04-20 DIAGNOSIS — Z87891 Personal history of nicotine dependence: Secondary | ICD-10-CM | POA: Insufficient documentation

## 2014-04-20 DIAGNOSIS — N938 Other specified abnormal uterine and vaginal bleeding: Secondary | ICD-10-CM | POA: Insufficient documentation

## 2014-04-20 DIAGNOSIS — I10 Essential (primary) hypertension: Secondary | ICD-10-CM | POA: Insufficient documentation

## 2014-04-20 LAB — CBC
HCT: 35.1 % — ABNORMAL LOW (ref 36.0–46.0)
HEMOGLOBIN: 11 g/dL — AB (ref 12.0–15.0)
MCH: 24.9 pg — AB (ref 26.0–34.0)
MCHC: 31.3 g/dL (ref 30.0–36.0)
MCV: 79.6 fL (ref 78.0–100.0)
Platelets: 268 10*3/uL (ref 150–400)
RBC: 4.41 MIL/uL (ref 3.87–5.11)
RDW: 18 % — ABNORMAL HIGH (ref 11.5–15.5)
WBC: 6.2 10*3/uL (ref 4.0–10.5)

## 2014-04-20 MED ORDER — IBUPROFEN 600 MG PO TABS: 600.0000 mg | ORAL_TABLET | Freq: Four times a day (QID) | ORAL | Status: DC | PRN

## 2014-04-20 MED ORDER — KETOROLAC TROMETHAMINE 60 MG/2ML IM SOLN
60.0000 mg | Freq: Once | INTRAMUSCULAR | Status: AC
  Administered 2014-04-20: 60 mg via INTRAMUSCULAR
  Filled 2014-04-20: qty 2

## 2014-04-20 MED ORDER — MEGESTROL ACETATE 40 MG PO TABS: ORAL_TABLET | ORAL | Status: DC

## 2014-04-20 NOTE — MAU Provider Note (Signed)
History     CSN: [id number]  Arrival date and time: 04/20/14 1412   First Provider Initiated Contact with Patient 04/20/14 1453      Chief Complaint  Patient presents with  . Vaginal Bleeding  . Pelvic Pain   HPI This is a 28 y.o. female who is 2 weeks post op from a D&C who presents with c/o vaginal bleeding and severe cramping, unrelieved by Motrin+Percocet. Took last Percocet today. She was seen for this also on 04/10/14 and was told to resume Megace until the July 20 appt. They also mentioned they might use Lysteda "if periods become regular".    Pathology Report Endometrium, curettage 04/07/14:  - WEAKLY SECRETORY TO INACTIVE ENDOMETRIUM WITH PSEUDODECIDUALIZED STROMA,  CONSISTENT WITH HORMONE EFFECT.  - NO HYPERPLASIA OR MALIGNANCY.  Valinda Hoar MD  Pathologist, Electronic Signature  RN Note:  Pt presents to MAU with c/o vaginal bleeding and severe pelvic pain since she had a d&c and hysteroscopy on 06/25. She was seen approx 2 weeks ago for the same thing.       OB History   Grav Para Term Preterm Abortions TAB SAB Ect Mult Living   0               Past Medical History  Diagnosis Date  . PCOS (polycystic ovarian syndrome)   . Asthma   . Obese   . Urinary tract infection   . Chlamydia   . Genital herpes   . Morbid obesity   . Shortness of breath     on exertion from weight  . Hypertension     no meds since April  . Chest pain of uncertain etiology     intermittent left side chest pain  . Anemia     history of anemia    Past Surgical History  Procedure Laterality Date  . Wisdom tooth extraction    . No past surgeries    . Hysteroscopy w/d&c N/A 04/06/2014    Procedure: DILATATION AND CURETTAGE /HYSTEROSCOPY ;  Surgeon: Tereso Newcomer, MD;  Location: [facility];  Service: Gynecology;  Laterality: N/A;  . Dilation and curettage of uterus      Family History  Problem Relation Age of Onset  . Heart disease Mother   . Diabetes Father   . Heart disease  Father   . Heart disease Maternal Grandmother   . Diabetes Maternal Grandfather   . Heart disease Maternal Grandfather   . Sleep apnea Mother     History  Substance Use Topics  . Smoking status: Former Smoker    Quit date: 01/04/2011  . Smokeless tobacco: Never Used     Comment: quit 2009  . Alcohol Use: Yes     Comment: occasonal     Allergies: No Known Allergies  Prescriptions prior to admission  Medication Sig Dispense Refill  . albuterol (PROVENTIL HFA;VENTOLIN HFA) 108 (90 BASE) MCG/ACT inhaler Inhale 2 puffs into the lungs every 6 (six) hours as needed (asthma).      . ibuprofen (ADVIL,MOTRIN) 800 MG tablet Take 800 mg by mouth every 6 (six) hours as needed for moderate pain or cramping.      Marland Kitchen ketorolac (TORADOL) 10 MG tablet Take 1 tablet (10 mg total) by mouth every 6 (six) hours as needed.  10 tablet  0  . oxyCODONE-acetaminophen (PERCOCET/ROXICET) 5-325 MG per tablet Take 1 tablet by mouth every 6 (six) hours as needed for severe pain.        Review  of Systems  Constitutional: Positive for malaise/fatigue. Negative for fever and chills.  Gastrointestinal: Positive for abdominal pain. Negative for nausea, vomiting, diarrhea and constipation.  Genitourinary:       Vaginal bleeding   Neurological: Negative for dizziness.   Physical Exam   Blood pressure 130/72, pulse 110, temperature 98.8 F (37.1 C), resp. rate 18, height 5\' 5"  (1.651 m), weight 153.769 kg (339 lb), last menstrual period 12/29/2013.  Physical Exam  Constitutional: She is oriented to person, place, and time. She appears well-developed and well-nourished. No distress.  HENT:  Head: Normocephalic.  Cardiovascular: Normal rate.   Respiratory: Effort normal.  GI: Soft. She exhibits no distension. There is tenderness. There is no rebound and no guarding.  Genitourinary: Vaginal discharge (moderate to heavy leeding) found.  Musculoskeletal: Normal range of motion.  Neurological: She is alert and  oriented to person, place, and time.  Skin: Skin is warm and dry.  Psychiatric: She has a normal mood and affect.    MAU Course  Procedures  MDM Results for orders placed during the hospital encounter of 04/20/14 (from the past 24 hour(s))  CBC     Status: Abnormal   Collection Time    04/20/14  3:15 PM      Result Value Ref Range   WBC 6.2  4.0 - 10.5 K/uL   RBC 4.41  3.87 - 5.11 MIL/uL   Hemoglobin 11.0 (*) 12.0 - 15.0 g/dL   HCT 16.135.1 (*) 09.636.0 - 04.546.0 %   MCV 79.6  78.0 - 100.0 fL   MCH 24.9 (*) 26.0 - 34.0 pg   MCHC 31.3  30.0 - 36.0 g/dL   RDW 40.918.0 (*) 81.111.5 - 91.415.5 %   Platelets 268  150 - 400 K/uL   Last Hgb:  12.5  Assessment and Plan  A:  Dysfunctional Uterine Bleeding       Slight decrease in hemoglobin  P;  Discussed with Dr Penne LashLeggett       Advised to restart Megace with taper        Followup as scheduled in clinic with Dr Macon LargeAnyanwu.         Wynelle BourgeoisWILLIAMS,Megean Fabio 04/20/2014, 3:31 PM

## 2014-04-20 NOTE — Discharge Instructions (Signed)
Abnormal Uterine Bleeding Abnormal uterine bleeding can affect women at various stages in life, including teenagers, women in their reproductive years, pregnant women, and women who have reached menopause. Several kinds of uterine bleeding are considered abnormal, including:  Bleeding or spotting between periods.   Bleeding after sexual intercourse.   Bleeding that is heavier or more than normal.   Periods that last longer than usual.  Bleeding after menopause.  Many cases of abnormal uterine bleeding are minor and simple to treat, while others are more serious. Any type of abnormal bleeding should be evaluated by your health care provider. Treatment will depend on the cause of the bleeding. HOME CARE INSTRUCTIONS Monitor your condition for any changes. The following actions may help to alleviate any discomfort you are experiencing:  Avoid the use of tampons and douches as directed by your health care provider.  Change your pads frequently. You should get regular pelvic exams and Pap tests. Keep all follow-up appointments for diagnostic tests as directed by your health care provider.  SEEK MEDICAL CARE IF:   Your bleeding lasts more than 1 week.   You feel dizzy at times.  SEEK IMMEDIATE MEDICAL CARE IF:   You pass out.   You are changing pads every 15 to 30 minutes.   You have abdominal pain.  You have a fever.   You become sweaty or weak.   You are passing large blood clots from the vagina.   You start to feel nauseous and vomit. MAKE SURE YOU:   Understand these instructions.  Will watch your condition.  Will get help right away if you are not doing well or get worse. Document Released: 09/29/2005 Document Revised: 10/04/2013 Document Reviewed: 04/28/2013 ExitCare Patient Information 2015 [employer]. This information is not intended to replace advice given to you by your health care provider. Make sure you discuss any questions you have with your  health care provider.  

## 2014-04-20 NOTE — MAU Note (Signed)
Pt presents to MAU with c/o vaginal bleeding and severe pelvic pain since she had a d&c and hysteroscopy on 06/25. She was seen approx 2 weeks ago for the same thing.

## 2014-04-26 ENCOUNTER — Telehealth: Payer: Self-pay

## 2014-04-26 NOTE — MAU Provider Note (Signed)
Attestation of Attending Supervision of Advanced Practitioner (CNM/NP): Evaluation and management procedures were performed by the Advanced Practitioner under my supervision and collaboration. I have reviewed the Advanced Practitioner's note and chart, and I agree with the management and plan.  LEGGETT,KELLY H. 8:21 AM

## 2014-04-26 NOTE — Telephone Encounter (Signed)
Pt called and stated that she was in the hospital and was told to take ibuprofen for her pain and its not working and that she is also taking megace.  " I have a lot of stabbing pain in my uterus, I need something else"

## 2014-04-26 NOTE — Telephone Encounter (Signed)
Pt returned call and I informed that at this time ibuprofen will be what she will take and that on her appt scheduled for 05/01/14 the provider will evaluate her pain at that time.  I advised pt to continue to take the megace and ibuprofen.  Pt agreed.

## 2014-04-26 NOTE — Telephone Encounter (Signed)
Called patient, no answer- left message that I am trying to return your phone call, please call us back at the clinics

## 2014-05-01 ENCOUNTER — Ambulatory Visit (INDEPENDENT_AMBULATORY_CARE_PROVIDER_SITE_OTHER): Payer: Self-pay | Admitting: Obstetrics & Gynecology

## 2014-05-01 ENCOUNTER — Encounter: Payer: Self-pay | Admitting: Obstetrics & Gynecology

## 2014-05-01 VITALS — BP 140/86 | HR 98 | Temp 98.5°F | Ht 63.0 in | Wt 337.1 lb

## 2014-05-01 DIAGNOSIS — N926 Irregular menstruation, unspecified: Secondary | ICD-10-CM

## 2014-05-01 DIAGNOSIS — N939 Abnormal uterine and vaginal bleeding, unspecified: Secondary | ICD-10-CM

## 2014-05-01 DIAGNOSIS — N949 Unspecified condition associated with female genital organs and menstrual cycle: Secondary | ICD-10-CM

## 2014-05-01 DIAGNOSIS — Z09 Encounter for follow-up examination after completed treatment for conditions other than malignant neoplasm: Secondary | ICD-10-CM

## 2014-05-01 DIAGNOSIS — R102 Pelvic and perineal pain: Secondary | ICD-10-CM

## 2014-05-01 MED ORDER — CYCLOBENZAPRINE HCL 10 MG PO TABS: 10.0000 mg | ORAL_TABLET | Freq: Three times a day (TID) | ORAL | Status: DC | PRN

## 2014-05-01 NOTE — Patient Instructions (Signed)
Return to clinic for any scheduled appointments or for any gynecologic concerns as needed.   

## 2014-05-01 NOTE — Progress Notes (Signed)
   CLINIC ENCOUNTER NOTE  History:  28 y.o. G0P0 here today for follow up after Hysteroscopy, D&C on 04/07/14. Still has pelvic pain while walking or after prolonged standing, feels like something is pulling.  Also has pain during bowel movement, urinating or bearing down Philis Kendall/Valsalva. No other symptoms. No bleeding; has been taking Megace 80 mg qd, awaiting [employer] Mirena IUD approval.  The following portions of the patient's history were reviewed and updated as appropriate: allergies, current medications, past family history, past medical history, past social history, past surgical history and problem list.  Review of Systems:  Pertinent items are noted in HPI.  Objective:  BP 140/86  Pulse 98  Temp(Src) 98.5 F (36.9 C) (Oral)  Ht 5\' 3"  (1.6 m)  Wt 337 lb 1.6 oz (152.908 kg)  BMI 59.73 kg/m2  LMP 12/29/2013 Physical Exam deferred  Endometrium, curettage 04/07/14:  - WEAKLY SECRETORY TO INACTIVE ENDOMETRIUM WITH PSEUDODECIDUALIZED STROMA,  CONSISTENT WITH HORMONE EFFECT.  - NO HYPERPLASIA OR MALIGNANCY.   01/03/2014 TRANSVAGINAL ULTRASOUND OF PELVIS CLINICAL DATA: Pelvic pain. TECHNIQUE: Transvaginal ultrasound examination of the pelvis was performed including evaluation of the uterus, ovaries, adnexal regions, and pelvic cul-de-sac. COMPARISON: Pelvic ultrasound 11/16/2013. FINDINGS: Uterus Measurements: 13.7 x 7.6 x 6.3 cm. No fibroids or other mass visualized. Endometrium Thickness: 22 mm. No focal abnormality visualized. Right ovary Could not be visualized. Left ovary Measurements: 4.2 x 2.3 x 3.3 cm. Normal appearance/no adnexal mass. Other findings: No free fluid IMPRESSION: 1. Limited examination demonstrating no definite acute findings. 2. Endometrial thickness is considered abnormal. Consider follow-up by US in 6-8 weeks, during the week immediately following menses (exam timing is critical). Electronically Signed By: Trudie Reedaniel Entrikin M.D. On: 01/03/2014 02:48   Assessment &  Plan:  Continue Megace for bleeding; Ibuprofen for pain Flexeril prescribed for presumed musculoskeletal pain due to habitus; unlikely due to uterine pain especially in the absence of any structural uterine anomalies Return for IUD placement once approved.    Jaynie CollinsUGONNA  Junetta Hearn, MD, FACOG Attending Obstetrician & Gynecologist Center [facility]

## 2014-07-28 ENCOUNTER — Other Ambulatory Visit: Payer: Self-pay

## 2014-08-02 ENCOUNTER — Telehealth: Payer: Self-pay | Admitting: *Deleted

## 2014-08-02 NOTE — Telephone Encounter (Signed)
Contacted patient concerning financial information for Mirena.  Informed patient of acceptable forms of income that would need to be provided.  Informed patient that we need information by 08/11/2014 in order to proceed.  Pt verbalizes information.

## 2014-08-17 ENCOUNTER — Inpatient Hospital Stay (HOSPITAL_COMMUNITY)
Admission: AD | Admit: 2014-08-17 | Discharge: 2014-08-18 | Disposition: A | Discharge status: Home / Self Care | Payer: Self-pay | Source: Ambulatory Visit | Attending: Obstetrics and Gynecology | Admitting: Obstetrics and Gynecology

## 2014-08-17 DIAGNOSIS — R1030 Lower abdominal pain, unspecified: Secondary | ICD-10-CM

## 2014-08-17 DIAGNOSIS — Z87891 Personal history of nicotine dependence: Secondary | ICD-10-CM | POA: Insufficient documentation

## 2014-08-17 DIAGNOSIS — R102 Pelvic and perineal pain: Secondary | ICD-10-CM | POA: Insufficient documentation

## 2014-08-17 LAB — WET PREP, GENITAL
Clue Cells Wet Prep HPF POC: NONE SEEN
Trich, Wet Prep: NONE SEEN
Yeast Wet Prep HPF POC: NONE SEEN

## 2014-08-17 LAB — URINALYSIS, ROUTINE W REFLEX MICROSCOPIC
BILIRUBIN URINE: NEGATIVE
GLUCOSE, UA: NEGATIVE mg/dL
Hgb urine dipstick: NEGATIVE
KETONES UR: NEGATIVE mg/dL
Leukocytes, UA: NEGATIVE
Nitrite: NEGATIVE
Protein, ur: NEGATIVE mg/dL
Specific Gravity, Urine: 1.015 (ref 1.005–1.030)
Urobilinogen, UA: 0.2 mg/dL (ref 0.0–1.0)
pH: 5.5 (ref 5.0–8.0)

## 2014-08-17 LAB — POCT PREGNANCY, URINE: PREG TEST UR: NEGATIVE

## 2014-08-17 MED ORDER — KETOROLAC TROMETHAMINE 60 MG/2ML IM SOLN
60.0000 mg | Freq: Once | INTRAMUSCULAR | Status: AC
  Administered 2014-08-17: 60 mg via INTRAMUSCULAR
  Filled 2014-08-17: qty 2

## 2014-08-17 NOTE — MAU Note (Signed)
Pt states she started having pain since Monday.Pt sstates she has been having this pain  On and off since July but it is just starting to get worse.

## 2014-08-17 NOTE — MAU Provider Note (Signed)
History     CSN: [id number]  Arrival date and time: 08/17/14 1946   First Provider Initiated Contact with Patient 08/17/14 2231      Chief Complaint  Patient presents with  . Abdominal Pain  . Pelvic Pain   HPI  Pt is a 28 y/o G0P0 presenting with pelvic pain off and on since July 2015 but has worsened over the past 3 days. She reports bilateral pelvic cramping with the right worse than left. She reports cramping after urination and with BMs as well as dyspareunia. She denies any vaginal bleeding, abnormal discharge, itching, lesions, dysuria, hematuria, vomiting, diarrhea, or melena. She reports having a D&C with hysteroscopy in June 2015 for similar symptoms. She does not know when her LMP was because she has been taking Megace. She was seen in clinic 05/01/14 and given Flexeril for the pain. She reports that it helps for a couple hours but then the pain returns. She reports being sexually active with one partner. She denies any birth control methods. She reports a history of herpes, chlamydia, and trichomonas.   Past Medical History  Diagnosis Date  . PCOS (polycystic ovarian syndrome)   . Asthma   . Obese   . Urinary tract infection   . Chlamydia   . Genital herpes   . Morbid obesity   . Shortness of breath     on exertion from weight  . Hypertension     no meds since April  . Chest pain of uncertain etiology     intermittent left side chest pain  . Anemia     history of anemia    Past Surgical History  Procedure Laterality Date  . Wisdom tooth extraction    . No past surgeries    . Hysteroscopy w/d&c N/A 04/06/2014    Procedure: DILATATION AND CURETTAGE /HYSTEROSCOPY ;  Surgeon: Tereso NewcomerUgonna A Anyanwu, MD;  Location: [facility];  Service: Gynecology;  Laterality: N/A;  . Dilation and curettage of uterus      Family History  Problem Relation Age of Onset  . Heart disease Mother   . Diabetes Father   . Heart disease Father   . Heart disease Maternal Grandmother   .  Diabetes Maternal Grandfather   . Heart disease Maternal Grandfather   . Sleep apnea Mother     History  Substance Use Topics  . Smoking status: Former Smoker    Quit date: 01/04/2011  . Smokeless tobacco: Never Used     Comment: quit 2009  . Alcohol Use: Yes     Comment: occasonal     Allergies: No Known Allergies  Prescriptions prior to admission  Medication Sig Dispense Refill Last Dose  . albuterol (PROVENTIL HFA;VENTOLIN HFA) 108 (90 BASE) MCG/ACT inhaler Inhale 2 puffs into the lungs every 6 (six) hours as needed (asthma).   Taking  . cyclobenzaprine (FLEXERIL) 10 MG tablet Take 1 tablet (10 mg total) by mouth every 8 (eight) hours as needed for muscle spasms. 30 tablet 1   . ibuprofen (ADVIL,MOTRIN) 600 MG tablet Take 1 tablet (600 mg total) by mouth every 6 (six) hours as needed. 30 tablet 1 Taking  . ibuprofen (ADVIL,MOTRIN) 800 MG tablet Take 800 mg by mouth every 6 (six) hours as needed for moderate pain or cramping.   Taking  . ketorolac (TORADOL) 10 MG tablet Take 1 tablet (10 mg total) by mouth every 6 (six) hours as needed. 10 tablet 0 Not Taking  . megestrol (MEGACE) 40 MG tablet  Take two tablets three times daily for 3 days, then two tablets twice daily for 3 days, then one tablet twice daily 90 tablet 3 Taking  . oxyCODONE-acetaminophen (PERCOCET/ROXICET) 5-325 MG per tablet Take 1 tablet by mouth every 6 (six) hours as needed for severe pain.   Not Taking    Review of Systems  Constitutional: Negative for fever and chills.  Respiratory: Negative for shortness of breath.   Cardiovascular: Negative for chest pain.  Gastrointestinal: Positive for nausea. Negative for vomiting, diarrhea, blood in stool and melena.  Genitourinary: Negative for dysuria, urgency, frequency, hematuria and flank pain.       Positive for pelvic cramping. Negative for vaginal bleeding, abnormal discharge, lesions.   Neurological: Negative for headaches.   Physical Exam   Blood pressure  133/82, pulse 60, temperature 98.8 F (37.1 C), temperature source Oral, resp. rate 20, height 5\' 5"  (1.651 m), weight 156.491 kg (345 lb), SpO2 100 %.  Physical Exam  Constitutional: She is oriented to person, place, and time. She appears well-nourished.  Morbidly obese  Cardiovascular: Normal rate, regular rhythm and normal heart sounds.   Respiratory: Effort normal and breath sounds normal. No respiratory distress. She has no wheezes.  GI: Soft. Bowel sounds are normal. She exhibits no distension. There is no tenderness. There is no rebound and no guarding.  Genitourinary: There is no rash, tenderness or lesion on the right labia. There is no rash, tenderness or lesion on the left labia. Uterus is tender. Cervix exhibits no motion tenderness, no discharge and no friability. Right adnexum displays tenderness. Right adnexum displays no mass and no fullness. Left adnexum displays no mass, no tenderness and no fullness. No erythema, tenderness or bleeding in the vagina. Vaginal discharge found.  White vaginal discharge.   Musculoskeletal: She exhibits no edema or tenderness.  Neurological: She is alert and oriented to person, place, and time.    MAU Course  Procedures   Endometrium, curettage 04/07/14:  - WEAKLY SECRETORY TO INACTIVE ENDOMETRIUM WITH PSEUDODECIDUALIZED STROMA,  CONSISTENT WITH HORMONE EFFECT.  - NO HYPERPLASIA OR MALIGNANCY.   - UPT: negative  - U/A: WNL  -GC/C: pending  -Wet prep: WNL Results for orders placed or performed during the hospital encounter of 08/17/14 (from the past 72 hour(s))  Urinalysis, Routine w reflex microscopic     Status: None   Collection Time: 08/17/14  7:50 PM  Result Value Ref Range   Color, Urine YELLOW YELLOW   APPearance CLEAR CLEAR   Specific Gravity, Urine 1.015 1.005 - 1.030   pH 5.5 5.0 - 8.0   Glucose, UA NEGATIVE NEGATIVE mg/dL   Hgb urine dipstick NEGATIVE NEGATIVE   Bilirubin Urine NEGATIVE NEGATIVE   Ketones, ur NEGATIVE  NEGATIVE mg/dL   Protein, ur NEGATIVE NEGATIVE mg/dL   Urobilinogen, UA 0.2 0.0 - 1.0 mg/dL   Nitrite NEGATIVE NEGATIVE   Leukocytes, UA NEGATIVE NEGATIVE    Comment: MICROSCOPIC NOT DONE ON URINES WITH NEGATIVE PROTEIN, BLOOD, LEUKOCYTES, NITRITE, OR GLUCOSE <1000 mg/dL.  Pregnancy, urine POC     Status: None   Collection Time: 08/17/14  8:37 PM  Result Value Ref Range   Preg Test, Ur NEGATIVE NEGATIVE    Comment:        THE SENSITIVITY OF THIS METHODOLOGY IS >24 mIU/mL   Wet prep, genital     Status: Abnormal   Collection Time: 08/17/14 10:55 PM  Result Value Ref Range   Yeast Wet Prep HPF POC NONE SEEN NONE SEEN  Trich, Wet Prep NONE SEEN NONE SEEN   Clue Cells Wet Prep HPF POC NONE SEEN NONE SEEN   WBC, Wet Prep HPF POC RARE (A) NONE SEEN    Comment: FEW BACTERIA SEEN   -Toradol injection 60 mg  Assessment and Plan  A: 28 y/o G0P0 with pelvic pain secondary to MSK cause or possibly endometriosis.  Unlikely to be PID due to no CMT or signs of cervicitis. GC/C pending.   P: D/C to home  - follow up in [facility] for continued management  - Continue taking Flexeril prn for pain - Rx Ultram 50mg  1 po q8h (#20)    Janalyn Rouse 08/17/2014, 11:06 PM  Delbert Phenix, NP

## 2014-08-17 NOTE — MAU Note (Signed)
Pt reports she is having a lot of pelvic pain off/on since July, pain has worsened over the last week. Reports pain after urination. Pt reports she had a D&C and a hysteroscopy in June and since then she only spots off/on

## 2014-08-17 NOTE — Progress Notes (Signed)
Pt is having" pain in my ovaries and that radiates to my utuerus." dual pain that is cramping and sharp. That radiates to my back and I have pain with sex.

## 2014-08-18 LAB — GC/CHLAMYDIA PROBE AMP
CT Probe RNA: NEGATIVE
GC Probe RNA: NEGATIVE

## 2014-08-18 LAB — HIV ANTIBODY (ROUTINE TESTING W REFLEX): HIV: NONREACTIVE

## 2014-08-18 MED ORDER — TRAMADOL HCL 50 MG PO TABS: 50.0000 mg | ORAL_TABLET | Freq: Four times a day (QID) | ORAL | Status: DC | PRN

## 2014-08-18 NOTE — Discharge Instructions (Signed)
Abdominal Pain, Women °Abdominal (stomach, pelvic, or belly) pain can be caused by many things. It is important to tell your doctor: °· The location of the pain. °· Does it come and go or is it present all the time? °· Are there things that start the pain (eating certain foods, exercise)? °· Are there other symptoms associated with the pain (fever, nausea, vomiting, diarrhea)? °All of this is helpful to know when trying to find the cause of the pain. °CAUSES  °· Stomach: virus or bacteria infection, or ulcer. °· Intestine: appendicitis (inflamed appendix), regional ileitis (Crohn's disease), ulcerative colitis (inflamed colon), irritable bowel syndrome, diverticulitis (inflamed diverticulum of the colon), or cancer of the stomach or intestine. °· Gallbladder disease or stones in the gallbladder. °· Kidney disease, kidney stones, or infection. °· Pancreas infection or cancer. °· Fibromyalgia (pain disorder). °· Diseases of the female organs: °¨ Uterus: fibroid (non-cancerous) tumors or infection. °¨ Fallopian tubes: infection or tubal pregnancy. °¨ Ovary: cysts or tumors. °¨ Pelvic adhesions (scar tissue). °¨ Endometriosis (uterus lining tissue growing in the pelvis and on the pelvic organs). °¨ Pelvic congestion syndrome (female organs filling up with blood just before the menstrual period). °¨ Pain with the menstrual period. °¨ Pain with ovulation (producing an egg). °¨ Pain with an IUD (intrauterine device, birth control) in the uterus. °¨ Cancer of the female organs. °· Functional pain (pain not caused by a disease, may improve without treatment). °· Psychological pain. °· Depression. °DIAGNOSIS  °Your doctor will decide the seriousness of your pain by doing an examination. °· Blood tests. °· X-rays. °· Ultrasound. °· CT scan (computed tomography, special type of X-ray). °· MRI (magnetic resonance imaging). °· Cultures, for infection. °· Barium enema (dye inserted in the large intestine, to better view it with  X-rays). °· Colonoscopy (looking in intestine with a lighted tube). °· Laparoscopy (minor surgery, looking in abdomen with a lighted tube). °· Major abdominal exploratory surgery (looking in abdomen with a large incision). °TREATMENT  °The treatment will depend on the cause of the pain.  °· Many cases can be observed and treated at home. °· Over-the-counter medicines recommended by your caregiver. °· Prescription medicine. °· Antibiotics, for infection. °· Birth control pills, for painful periods or for ovulation pain. °· Hormone treatment, for endometriosis. °· Nerve blocking injections. °· Physical therapy. °· Antidepressants. °· Counseling with a psychologist or psychiatrist. °· Minor or major surgery. °HOME CARE INSTRUCTIONS  °· Do not take laxatives, unless directed by your caregiver. °· Take over-the-counter pain medicine only if ordered by your caregiver. Do not take aspirin because it can cause an upset stomach or bleeding. °· Try a clear liquid diet (broth or water) as ordered by your caregiver. Slowly move to a bland diet, as tolerated, if the pain is related to the stomach or intestine. °· Have a thermometer and take your temperature several times a day, and record it. °· Bed rest and sleep, if it helps the pain. °· Avoid sexual intercourse, if it causes pain. °· Avoid stressful situations. °· Keep your follow-up appointments and tests, as your caregiver orders. °· If the pain does not go away with medicine or surgery, you may try: °¨ Acupuncture. °¨ Relaxation exercises (yoga, meditation). °¨ Group therapy. °¨ Counseling. °SEEK MEDICAL CARE IF:  °· You notice certain foods cause stomach pain. °· Your home care treatment is not helping your pain. °· You need stronger pain medicine. °· You want your IUD removed. °· You feel faint or   lightheaded. °· You develop nausea and vomiting. °· You develop a rash. °· You are having side effects or an allergy to your medicine. °SEEK IMMEDIATE MEDICAL CARE IF:  °· Your  pain does not go away or gets worse. °· You have a fever. °· Your pain is felt only in portions of the abdomen. The right side could possibly be appendicitis. The left lower portion of the abdomen could be colitis or diverticulitis. °· You are passing blood in your stools (bright red or black tarry stools, with or without vomiting). °· You have blood in your urine. °· You develop chills, with or without a fever. °· You pass out. °MAKE SURE YOU:  °· Understand these instructions. °· Will watch your condition. °· Will get help right away if you are not doing well or get worse. °Document Released: 07/27/2007 Document Revised: 02/13/2014 Document Reviewed: 08/16/2009 °ExitCare® Patient Information ©2015 [employer]. This information is not intended to replace advice given to you by your health care provider. Make sure you discuss any questions you have with your health care provider. ° °

## 2014-10-17 ENCOUNTER — Telehealth: Payer: Self-pay | Admitting: General Practice

## 2014-10-17 DIAGNOSIS — N938 Other specified abnormal uterine and vaginal bleeding: Secondary | ICD-10-CM

## 2014-10-17 MED ORDER — MEGESTROL ACETATE 40 MG PO TABS: 40.0000 mg | ORAL_TABLET | Freq: Every day | ORAL | Status: DC

## 2014-10-17 NOTE — Telephone Encounter (Signed)
Patient called and left message stating she called [facility] on [address] for her megace 40mg  but was told it's not there and to call her doctor's office. Per chart review patient was awaiting assistance [facility] for free IUD. Patient was supposed to drop off income paperwork to finalize application, unsure if she did this. Called Dr Macon LargeAnyanwu who refilled megace 40mg . Called patient, no answer- left message stating we are returning your phone call and wanted to let you know we refilled your medication and to bring your proof of income to your appt with us on Friday 1/8. If you have any questions or concerns you may call us back at the clinics

## 2014-10-20 ENCOUNTER — Ambulatory Visit: Payer: Self-pay | Admitting: Family Medicine

## 2014-12-05 ENCOUNTER — Encounter (HOSPITAL_COMMUNITY): Payer: Self-pay | Admitting: *Deleted

## 2014-12-05 ENCOUNTER — Inpatient Hospital Stay (HOSPITAL_COMMUNITY)
Admission: AD | Admit: 2014-12-05 | Discharge: 2014-12-05 | Disposition: A | Discharge status: Home / Self Care | Payer: Self-pay | Source: Ambulatory Visit | Attending: Family Medicine | Admitting: Family Medicine

## 2014-12-05 DIAGNOSIS — R102 Pelvic and perineal pain: Secondary | ICD-10-CM

## 2014-12-05 DIAGNOSIS — Z87891 Personal history of nicotine dependence: Secondary | ICD-10-CM | POA: Insufficient documentation

## 2014-12-05 DIAGNOSIS — N939 Abnormal uterine and vaginal bleeding, unspecified: Secondary | ICD-10-CM

## 2014-12-05 DIAGNOSIS — N949 Unspecified condition associated with female genital organs and menstrual cycle: Secondary | ICD-10-CM | POA: Insufficient documentation

## 2014-12-05 DIAGNOSIS — Z6841 Body Mass Index (BMI) 40.0 and over, adult: Secondary | ICD-10-CM | POA: Insufficient documentation

## 2014-12-05 LAB — CBC
HCT: 39.1 % (ref 36.0–46.0)
Hemoglobin: 12.7 g/dL (ref 12.0–15.0)
MCH: 29 pg (ref 26.0–34.0)
MCHC: 32.5 g/dL (ref 30.0–36.0)
MCV: 89.3 fL (ref 78.0–100.0)
Platelets: 241 10*3/uL (ref 150–400)
RBC: 4.38 MIL/uL (ref 3.87–5.11)
RDW: 14 % (ref 11.5–15.5)
WBC: 6.9 10*3/uL (ref 4.0–10.5)

## 2014-12-05 LAB — URINALYSIS, ROUTINE W REFLEX MICROSCOPIC
BILIRUBIN URINE: NEGATIVE
GLUCOSE, UA: NEGATIVE mg/dL
Ketones, ur: NEGATIVE mg/dL
LEUKOCYTES UA: NEGATIVE
Nitrite: NEGATIVE
PH: 7 (ref 5.0–8.0)
Protein, ur: NEGATIVE mg/dL
Specific Gravity, Urine: 1.01 (ref 1.005–1.030)
Urobilinogen, UA: 0.2 mg/dL (ref 0.0–1.0)

## 2014-12-05 LAB — WET PREP, GENITAL
CLUE CELLS WET PREP: NONE SEEN
Trich, Wet Prep: NONE SEEN
Yeast Wet Prep HPF POC: NONE SEEN

## 2014-12-05 LAB — URINE MICROSCOPIC-ADD ON

## 2014-12-05 LAB — POCT PREGNANCY, URINE: Preg Test, Ur: NEGATIVE

## 2014-12-05 MED ORDER — KETOROLAC TROMETHAMINE 60 MG/2ML IM SOLN
60.0000 mg | Freq: Once | INTRAMUSCULAR | Status: AC
  Administered 2014-12-05: 60 mg via INTRAMUSCULAR
  Filled 2014-12-05: qty 2

## 2014-12-05 NOTE — MAU Note (Signed)
BP rechecked, changed cuff, smaller cuff but on forearm.  Pt denies hx of HTN, though has been up recently- but not this high. Not on meds

## 2014-12-05 NOTE — MAU Note (Addendum)
Started period 01/30,  Ended 02/12. 2 days ago started having light spotting.  Stabbing and pulling pain on rt side, also pain when she pees.  abd pain when she has a bowel movement and when she walks, always on rt.

## 2014-12-05 NOTE — MAU Provider Note (Signed)
History     CSN: [id number]  Arrival date and time: 12/05/14 1021   First Provider Initiated Contact with Patient 12/05/14 1134      Chief Complaint  Patient presents with  . Vaginal Bleeding  . Abdominal Pain   HPI Lori Mcknight 29 y.o. G0P0 nonpregnant female presents to MAU complaining of vaginal bleeding and pelvic pain. This has been ongoing for many months.  She has been placed on megace 40mg  qd.  She requests ultrasound and says she has not had this since her D&C 6/15.  She is also requesting pain management.  She has nausea at night but no vomiting.  She cannot afford her megace and has not used this is several weeks.  LMP was end of January and lasted 1/30-2/12.  She has also bled from 2/21-2/23.  No intercourse since 2015.  [facility] not [facility].   Last used 600mg  ibuprofen last night and 10mg  flexeril.  This helps but is not completely effective.  Pain is now 8/10, R>L, sharp, pulling sensation on the right.   OB History    Gravida Para Term Preterm AB TAB SAB Ectopic Multiple Living   0               Past Medical History  Diagnosis Date  . PCOS (polycystic ovarian syndrome)   . Asthma   . Obese   . Urinary tract infection   . Chlamydia   . Genital herpes   . Morbid obesity   . Shortness of breath     on exertion from weight  . Hypertension     no meds since April  . Chest pain of uncertain etiology     intermittent left side chest pain  . Anemia     history of anemia    Past Surgical History  Procedure Laterality Date  . Wisdom tooth extraction    . No past surgeries    . Hysteroscopy w/d&c N/A 04/06/2014    Procedure: DILATATION AND CURETTAGE /HYSTEROSCOPY ;  Surgeon: Tereso NewcomerUgonna A Anyanwu, MD;  Location: [facility];  Service: Gynecology;  Laterality: N/A;  . Dilation and curettage of uterus      Family History  Problem Relation Age of Onset  . Heart disease Mother   . Diabetes Father   . Heart disease Father   .  Heart disease Maternal Grandmother   . Diabetes Maternal Grandfather   . Heart disease Maternal Grandfather   . Sleep apnea Mother     History  Substance Use Topics  . Smoking status: Former Smoker    Quit date: 01/04/2011  . Smokeless tobacco: Never Used     Comment: quit 2009  . Alcohol Use: Yes     Comment: occasonal     Allergies: No Known Allergies  Prescriptions prior to admission  Medication Sig Dispense Refill Last Dose  . albuterol (PROVENTIL HFA;VENTOLIN HFA) 108 (90 BASE) MCG/ACT inhaler Inhale 2 puffs into the lungs every 6 (six) hours as needed (asthma).   Past Month at Unknown time  . cyclobenzaprine (FLEXERIL) 10 MG tablet Take 1 tablet (10 mg total) by mouth every 8 (eight) hours as needed for muscle spasms. 30 tablet 1 12/04/2014 at Unknown time  . ibuprofen (ADVIL,MOTRIN) 600 MG tablet Take 1 tablet (600 mg total) by mouth every 6 (six) hours as needed. 30 tablet 1 12/04/2014 at Unknown time  . megestrol (MEGACE) 40 MG tablet Take 1 tablet (40  mg total) by mouth daily. 30 tablet 3 Past Month at Unknown time  . ketorolac (TORADOL) 10 MG tablet Take 1 tablet (10 mg total) by mouth every 6 (six) hours as needed. (Patient not taking: Reported on 12/05/2014) 10 tablet 0 Not Taking  . traMADol (ULTRAM) 50 MG tablet Take 1 tablet (50 mg total) by mouth every 6 (six) hours as needed. (Patient taking differently: Take 50 mg by mouth every 6 (six) hours as needed for moderate pain. ) 20 tablet 0     Review of Systems  Respiratory: Negative for shortness of breath.   Cardiovascular: Negative for chest pain.  Gastrointestinal: Positive for nausea and abdominal pain. Negative for vomiting.  Genitourinary: Positive for dysuria.  Musculoskeletal: Positive for back pain.  Neurological: Positive for headaches. Negative for dizziness, tingling and weakness.   Physical Exam   Blood pressure 143/89, pulse 92, temperature 98.4 F (36.9 C), temperature source Oral, resp. rate 23,  height  (1.651 m), weight 359 lb (162.841 kg), last menstrual period 11/11/2014.  Physical Exam  Constitutional: She is oriented to person, place, and time. She appears well-developed and well-nourished. No distress.  HENT:  Head: Normocephalic and atraumatic.  Eyes: EOM are normal.  Neck: Normal range of motion.  Cardiovascular: Normal rate and regular rhythm.   Respiratory: Effort normal and breath sounds normal.  GI: Soft. She exhibits no distension. There is no tenderness. There is no rebound and no guarding.  Extremely large abdomen  Genitourinary:  Small amt of pink homogenous discharge  Mild tenderness noted throughout  Musculoskeletal: Normal range of motion.  Neurological: She is alert and oriented to person, place, and time.  Skin: Skin is warm and dry.  Psychiatric: She has a normal mood and affect.   Results for orders placed or performed during the hospital encounter of 12/05/14 (from the past 24 hour(s))  Urinalysis, Routine w reflex microscopic     Status: Abnormal   Collection Time: 12/05/14 10:33 AM  Result Value Ref Range   Color, Urine YELLOW YELLOW   APPearance CLEAR CLEAR   Specific Gravity, Urine 1.010 1.005 - 1.030   pH 7.0 5.0 - 8.0   Glucose, UA NEGATIVE NEGATIVE mg/dL   Hgb urine dipstick MODERATE (A) NEGATIVE   Bilirubin Urine NEGATIVE NEGATIVE   Ketones, ur NEGATIVE NEGATIVE mg/dL   Protein, ur NEGATIVE NEGATIVE mg/dL   Urobilinogen, UA 0.2 0.0 - 1.0 mg/dL   Nitrite NEGATIVE NEGATIVE   Leukocytes, UA NEGATIVE NEGATIVE  Urine microscopic-add on     Status: Abnormal   Collection Time: 12/05/14 10:33 AM  Result Value Ref Range   Squamous Epithelial / LPF FEW (A) RARE   RBC / HPF 0-2 <3 RBC/hpf   Bacteria, UA RARE RARE  Pregnancy, urine POC     Status: None   Collection Time: 12/05/14 10:49 AM  Result Value Ref Range   Preg Test, Ur NEGATIVE NEGATIVE  Wet prep, genital     Status: Abnormal   Collection Time: 12/05/14 11:30 AM  Result Value  Ref Range   Yeast Wet Prep HPF POC NONE SEEN NONE SEEN   Trich, Wet Prep NONE SEEN NONE SEEN   Clue Cells Wet Prep HPF POC NONE SEEN NONE SEEN   WBC, Wet Prep HPF POC FEW (A) NONE SEEN  CBC     Status: None   Collection Time: 12/05/14 11:45 AM  Result Value Ref Range   WBC 6.9 4.0 - 10.5 K/uL   RBC 4.38 3.87 -  5.11 MIL/uL   Hemoglobin 12.7 12.0 - 15.0 g/dL   HCT 16.1 09.6 - 04.5 %   MCV 89.3 78.0 - 100.0 fL   MCH 29.0 26.0 - 34.0 pg   MCHC 32.5 30.0 - 36.0 g/dL   RDW 40.9 81.1 - 91.4 %   Platelets 241 150 - 400 K/uL    MAU Course  Procedures  MDM Discussed with Dr. Adrian Blackwater.  No evidence for anemia.  He is in agreement to discharge pt to home with outpatient u/s and clinic f/u.  Pt may restart Megace and use the pain medications hse has at home.  Assessment and Plan  A:  1. Abnormal uterine bleeding (AUB)   2. Pelvic pain in female   3.      Obesity   P: discharge to home Okay to use ibuprofen, ultram, flexeril: she already has at home Restart megace Pelvic u/s ordered for outpatient follow up Call to schedule appt in clinic Patient may return to MAU as needed or if her condition were to change or worsen   Bertram Denver 12/05/2014, 11:40 AM

## 2014-12-05 NOTE — Discharge Instructions (Signed)
Abdominal Pain, Women °Abdominal (stomach, pelvic, or belly) pain can be caused by many things. It is important to tell your doctor: °· The location of the pain. °· Does it come and go or is it present all the time? °· Are there things that start the pain (eating certain foods, exercise)? °· Are there other symptoms associated with the pain (fever, nausea, vomiting, diarrhea)? °All of this is helpful to know when trying to find the cause of the pain. °CAUSES  °· Stomach: virus or bacteria infection, or ulcer. °· Intestine: appendicitis (inflamed appendix), regional ileitis (Crohn's disease), ulcerative colitis (inflamed colon), irritable bowel syndrome, diverticulitis (inflamed diverticulum of the colon), or cancer of the stomach or intestine. °· Gallbladder disease or stones in the gallbladder. °· Kidney disease, kidney stones, or infection. °· Pancreas infection or cancer. °· Fibromyalgia (pain disorder). °· Diseases of the female organs: °¨ Uterus: fibroid (non-cancerous) tumors or infection. °¨ Fallopian tubes: infection or tubal pregnancy. °¨ Ovary: cysts or tumors. °¨ Pelvic adhesions (scar tissue). °¨ Endometriosis (uterus lining tissue growing in the pelvis and on the pelvic organs). °¨ Pelvic congestion syndrome (female organs filling up with blood just before the menstrual period). °¨ Pain with the menstrual period. °¨ Pain with ovulation (producing an egg). °¨ Pain with an IUD (intrauterine device, birth control) in the uterus. °¨ Cancer of the female organs. °· Functional pain (pain not caused by a disease, may improve without treatment). °· Psychological pain. °· Depression. °DIAGNOSIS  °Your doctor will decide the seriousness of your pain by doing an examination. °· Blood tests. °· X-rays. °· Ultrasound. °· CT scan (computed tomography, special type of X-ray). °· MRI (magnetic resonance imaging). °· Cultures, for infection. °· Barium enema (dye inserted in the large intestine, to better view it with  X-rays). °· Colonoscopy (looking in intestine with a lighted tube). °· Laparoscopy (minor surgery, looking in abdomen with a lighted tube). °· Major abdominal exploratory surgery (looking in abdomen with a large incision). °TREATMENT  °The treatment will depend on the cause of the pain.  °· Many cases can be observed and treated at home. °· Over-the-counter medicines recommended by your caregiver. °· Prescription medicine. °· Antibiotics, for infection. °· Birth control pills, for painful periods or for ovulation pain. °· Hormone treatment, for endometriosis. °· Nerve blocking injections. °· Physical therapy. °· Antidepressants. °· Counseling with a psychologist or psychiatrist. °· Minor or major surgery. °HOME CARE INSTRUCTIONS  °· Do not take laxatives, unless directed by your caregiver. °· Take over-the-counter pain medicine only if ordered by your caregiver. Do not take aspirin because it can cause an upset stomach or bleeding. °· Try a clear liquid diet (broth or water) as ordered by your caregiver. Slowly move to a bland diet, as tolerated, if the pain is related to the stomach or intestine. °· Have a thermometer and take your temperature several times a day, and record it. °· Bed rest and sleep, if it helps the pain. °· Avoid sexual intercourse, if it causes pain. °· Avoid stressful situations. °· Keep your follow-up appointments and tests, as your caregiver orders. °· If the pain does not go away with medicine or surgery, you may try: °¨ Acupuncture. °¨ Relaxation exercises (yoga, meditation). °¨ Group therapy. °¨ Counseling. °SEEK MEDICAL CARE IF:  °· You notice certain foods cause stomach pain. °· Your home care treatment is not helping your pain. °· You need stronger pain medicine. °· You want your IUD removed. °· You feel faint or   lightheaded. °· You develop nausea and vomiting. °· You develop a rash. °· You are having side effects or an allergy to your medicine. °SEEK IMMEDIATE MEDICAL CARE IF:  °· Your  pain does not go away or gets worse. °· You have a fever. °· Your pain is felt only in portions of the abdomen. The right side could possibly be appendicitis. The left lower portion of the abdomen could be colitis or diverticulitis. °· You are passing blood in your stools (bright red or black tarry stools, with or without vomiting). °· You have blood in your urine. °· You develop chills, with or without a fever. °· You pass out. °MAKE SURE YOU:  °· Understand these instructions. °· Will watch your condition. °· Will get help right away if you are not doing well or get worse. °Document Released: 07/27/2007 Document Revised: 02/13/2014 Document Reviewed: 08/16/2009 °ExitCare® Patient Information ©2015 [employer]. This information is not intended to replace advice given to you by your health care provider. Make sure you discuss any questions you have with your health care provider. ° °

## 2014-12-06 LAB — GC/CHLAMYDIA PROBE AMP (~~LOC~~) NOT AT ARMC
Chlamydia: NEGATIVE
Neisseria Gonorrhea: NEGATIVE

## 2014-12-12 ENCOUNTER — Telehealth: Payer: Self-pay | Admitting: *Deleted

## 2014-12-12 ENCOUNTER — Other Ambulatory Visit (HOSPITAL_COMMUNITY): Payer: Self-pay | Admitting: Physician Assistant

## 2014-12-12 ENCOUNTER — Ambulatory Visit (HOSPITAL_COMMUNITY)
Admission: RE | Admit: 2014-12-12 | Discharge: 2014-12-12 | Disposition: A | Discharge status: Home / Self Care | Payer: Self-pay | Source: Ambulatory Visit | Attending: Physician Assistant | Admitting: Physician Assistant

## 2014-12-12 DIAGNOSIS — N939 Abnormal uterine and vaginal bleeding, unspecified: Secondary | ICD-10-CM

## 2014-12-12 DIAGNOSIS — R102 Pelvic and perineal pain: Secondary | ICD-10-CM

## 2014-12-12 NOTE — Telephone Encounter (Signed)
-----   Message from Vivien Rotaachael H Small, Rad Tech sent at 12/12/2014  3:26 PM EST ----- Regarding: U/S GYN RESULTS We have just completed a pelvic outpatient ultrasound scheduled for a patient who was seen in MAU.  Please call the patient with the results.

## 2014-12-12 NOTE — Telephone Encounter (Signed)
Contacted patient, results given, pt verbalizes understanding and has no further questions. 

## 2015-01-01 ENCOUNTER — Other Ambulatory Visit: Payer: Self-pay | Admitting: Obstetrics & Gynecology

## 2015-01-15 ENCOUNTER — Telehealth: Payer: Self-pay | Admitting: *Deleted

## 2015-01-15 NOTE — Telephone Encounter (Signed)
Pt contacted the clinic to refill prescriptions Tamadol and another medication.  Attempted to contact patient, no answer, left a message for patient to contact the clinic.

## 2015-01-16 NOTE — Telephone Encounter (Signed)
Called pt and pt asked if she could get a refill on her tramadol for her pain.  Looking at pt's chart pt has not been evaluated or seen in the office.  I explained to the pt that she would need to be evaluated by a provider in order to get a refill on her pain medication especially since she has not been seen since June 2015.  I advised pt that she can take Ibuprofen 800 mg q 8 hours for her pain until she can get an appt with the Clinics.  I also advised her that if her pain gets worse to please go to [facility].  Pt stated understanding with no further questions.

## 2015-02-09 ENCOUNTER — Encounter (HOSPITAL_COMMUNITY): Payer: Self-pay | Admitting: *Deleted

## 2015-02-09 ENCOUNTER — Inpatient Hospital Stay (HOSPITAL_COMMUNITY)
Admission: AD | Admit: 2015-02-09 | Discharge: 2015-02-09 | Disposition: A | Discharge status: Home / Self Care | Payer: Self-pay | Source: Ambulatory Visit | Attending: Obstetrics & Gynecology | Admitting: Obstetrics & Gynecology

## 2015-02-09 ENCOUNTER — Inpatient Hospital Stay (HOSPITAL_COMMUNITY): Payer: Self-pay

## 2015-02-09 DIAGNOSIS — G8929 Other chronic pain: Secondary | ICD-10-CM | POA: Insufficient documentation

## 2015-02-09 DIAGNOSIS — R102 Pelvic and perineal pain: Secondary | ICD-10-CM | POA: Insufficient documentation

## 2015-02-09 DIAGNOSIS — R938 Abnormal findings on diagnostic imaging of other specified body structures: Secondary | ICD-10-CM | POA: Insufficient documentation

## 2015-02-09 DIAGNOSIS — N760 Acute vaginitis: Secondary | ICD-10-CM | POA: Insufficient documentation

## 2015-02-09 DIAGNOSIS — R1031 Right lower quadrant pain: Secondary | ICD-10-CM | POA: Insufficient documentation

## 2015-02-09 DIAGNOSIS — B9689 Other specified bacterial agents as the cause of diseases classified elsewhere: Secondary | ICD-10-CM | POA: Insufficient documentation

## 2015-02-09 DIAGNOSIS — K5900 Constipation, unspecified: Secondary | ICD-10-CM | POA: Insufficient documentation

## 2015-02-09 DIAGNOSIS — E282 Polycystic ovarian syndrome: Secondary | ICD-10-CM | POA: Insufficient documentation

## 2015-02-09 DIAGNOSIS — R9389 Abnormal findings on diagnostic imaging of other specified body structures: Secondary | ICD-10-CM

## 2015-02-09 DIAGNOSIS — Z87891 Personal history of nicotine dependence: Secondary | ICD-10-CM | POA: Insufficient documentation

## 2015-02-09 LAB — CBC
HCT: 36.7 % (ref 36.0–46.0)
Hemoglobin: 11.8 g/dL — ABNORMAL LOW (ref 12.0–15.0)
MCH: 28 pg (ref 26.0–34.0)
MCHC: 32.2 g/dL (ref 30.0–36.0)
MCV: 87 fL (ref 78.0–100.0)
Platelets: 283 10*3/uL (ref 150–400)
RBC: 4.22 MIL/uL (ref 3.87–5.11)
RDW: 14.2 % (ref 11.5–15.5)
WBC: 7 10*3/uL (ref 4.0–10.5)

## 2015-02-09 LAB — URINALYSIS, ROUTINE W REFLEX MICROSCOPIC
Bilirubin Urine: NEGATIVE
GLUCOSE, UA: NEGATIVE mg/dL
Hgb urine dipstick: NEGATIVE
KETONES UR: NEGATIVE mg/dL
Leukocytes, UA: NEGATIVE
Nitrite: NEGATIVE
Protein, ur: NEGATIVE mg/dL
Specific Gravity, Urine: >1.03 — ABNORMAL HIGH (ref 1.005–1.030)
UROBILINOGEN UA: 0.2 mg/dL (ref 0.0–1.0)
pH: 5.5 (ref 5.0–8.0)

## 2015-02-09 LAB — WET PREP, GENITAL
TRICH WET PREP: NONE SEEN
WBC, Wet Prep HPF POC: NONE SEEN
Yeast Wet Prep HPF POC: NONE SEEN

## 2015-02-09 LAB — POCT PREGNANCY, URINE: PREG TEST UR: NEGATIVE

## 2015-02-09 MED ORDER — TRAMADOL HCL 50 MG PO TABS: 50.0000 mg | ORAL_TABLET | Freq: Four times a day (QID) | ORAL | Status: DC | PRN

## 2015-02-09 MED ORDER — METRONIDAZOLE 500 MG PO TABS: 500.0000 mg | ORAL_TABLET | Freq: Two times a day (BID) | ORAL | Status: DC

## 2015-02-09 MED ORDER — IBUPROFEN 600 MG PO TABS
600.0000 mg | ORAL_TABLET | Freq: Four times a day (QID) | ORAL | Status: DC | PRN
Start: 2015-02-09 — End: 2016-08-16

## 2015-02-09 MED ORDER — DOCUSATE SODIUM 100 MG PO CAPS: 100.0000 mg | ORAL_CAPSULE | Freq: Two times a day (BID) | ORAL | Status: DC | PRN

## 2015-02-09 NOTE — Discharge Instructions (Signed)
Chronic Pain Chronic pain can be defined as pain that is off and on and lasts for 3-6 months or longer. Many things cause chronic pain, which can make it difficult to make a diagnosis. There are many treatment options available for chronic pain. However, finding a treatment that works well for you may require trying various approaches until the right one is found. Many people benefit from a combination of two or more types of treatment to control their pain. SYMPTOMS  Chronic pain can occur anywhere in the body and can range from mild to very severe. Some types of chronic pain include:  Headache.  Low back pain.  Cancer pain.  Arthritis pain.  Neurogenic pain. This is pain resulting from damage to nerves. People with chronic pain may also have other symptoms such as:  Depression.  Anger.  Insomnia.  Anxiety. DIAGNOSIS  Your health care provider will help diagnose your condition over time. In many cases, the initial focus will be on excluding possible conditions that could be causing the pain. Depending on your symptoms, your health care provider may order tests to diagnose your condition. Some of these tests may include:   Blood tests.   CT scan.   MRI.   X-rays.   Ultrasounds.   Nerve conduction studies.  You may need to see a specialist.  TREATMENT  Finding treatment that works well may take time. You may be referred to a pain specialist. He or she may prescribe medicine or therapies, such as:   Mindful meditation or yoga.  Shots (injections) of numbing or pain-relieving medicines into the spine or area of pain.  Local electrical stimulation.  Acupuncture.   Massage therapy.   Aroma, color, light, or sound therapy.   Biofeedback.   Working with a physical therapist to keep from getting stiff.   Regular, gentle exercise.   Cognitive or behavioral therapy.   Group support.  Sometimes, surgery may be recommended.  HOME CARE INSTRUCTIONS    Take all medicines as directed by your health care provider.   Lessen stress in your life by relaxing and doing things such as listening to calming music.   Exercise or be active as directed by your health care provider.   Eat a healthy diet and include things such as vegetables, fruits, fish, and lean meats in your diet.   Keep all follow-up appointments with your health care provider.   Attend a support group with others suffering from chronic pain. SEEK MEDICAL CARE IF:   Your pain gets worse.   You develop a new pain that was not there before.   You cannot tolerate medicines given to you by your health care provider.   You have new symptoms since your last visit with your health care provider.  SEEK IMMEDIATE MEDICAL CARE IF:   You feel weak.   You have decreased sensation or numbness.   You lose control of bowel or bladder function.   Your pain suddenly gets much worse.   You develop shaking.  You develop chills.  You develop confusion.  You develop chest pain.  You develop shortness of breath.  MAKE SURE YOU:  Understand these instructions.  Will watch your condition.  Will get help right away if you are not doing well or get worse. Document Released: 06/21/2002 Document Revised: 06/01/2013 Document Reviewed: 03/25/2013 [facility] Patient Information 2015 [place], [place]. This information is not intended to replace advice given to you by your health care provider. Make sure you discuss any  questions you have with your health care provider.  Pelvic Pain Female pelvic pain can be caused by many different things and start from a variety of places. Pelvic pain refers to pain that is located in the lower half of the abdomen and between your hips. The pain may occur over a short period of time (acute) or may be reoccurring (chronic). The cause of pelvic pain may be related to disorders affecting the female reproductive organs (gynecologic), but it may  also be related to the bladder, kidney stones, an intestinal complication, or muscle or skeletal problems. Getting help right away for pelvic pain is important, especially if there has been severe, sharp, or a sudden onset of unusual pain. It is also important to get help right away because some types of pelvic pain can be life threatening.  CAUSES  Below are only some of the causes of pelvic pain. The causes of pelvic pain can be in one of several categories.   Gynecologic.  Pelvic inflammatory disease.  Sexually transmitted infection.  Ovarian cyst or a twisted ovarian ligament (ovarian torsion).  Uterine lining that grows outside the uterus (endometriosis).  Fibroids, cysts, or tumors.  Ovulation.  Pregnancy.  Pregnancy that occurs outside the uterus (ectopic pregnancy).  Miscarriage.  Labor.  Abruption of the placenta or ruptured uterus.  Infection.  Uterine infection (endometritis).  Bladder infection.  Diverticulitis.  Miscarriage related to a uterine infection (septic abortion).  Bladder.  Inflammation of the bladder (cystitis).  Kidney stone(s).  Gastrointestinal.  Constipation.  Diverticulitis.  Neurologic.  Trauma.  Feeling pelvic pain because of mental or emotional causes (psychosomatic).  Cancers of the bowel or pelvis. EVALUATION  Your caregiver will want to take a careful history of your concerns. This includes recent changes in your health, a careful gynecologic history of your periods (menses), and a sexual history. Obtaining your family history and medical history is also important. Your caregiver may suggest a pelvic exam. A pelvic exam will help identify the location and severity of the pain. It also helps in the evaluation of which organ system may be involved. In order to identify the cause of the pelvic pain and be properly treated, your caregiver may order tests. These tests may include:   A pregnancy test.  Pelvic  ultrasonography.  An X-ray exam of the abdomen.  A urinalysis or evaluation of vaginal discharge.  Blood tests. HOME CARE INSTRUCTIONS   Only take over-the-counter or prescription medicines for pain, discomfort, or fever as directed by your caregiver.   Rest as directed by your caregiver.   Eat a balanced diet.   Drink enough fluids to make your urine clear or pale yellow, or as directed.   Avoid sexual intercourse if it causes pain.   Apply warm or cold compresses to the lower abdomen depending on which one helps the pain.   Avoid stressful situations.   Keep a journal of your pelvic pain. Write down when it started, where the pain is located, and if there are things that seem to be associated with the pain, such as food or your menstrual cycle.  Follow up with your caregiver as directed.  SEEK MEDICAL CARE IF:  Your medicine does not help your pain.  You have abnormal vaginal discharge. SEEK IMMEDIATE MEDICAL CARE IF:   You have heavy bleeding from the vagina.   Your pelvic pain increases.   You feel light-headed or faint.   You have chills.   You have pain with urination or  blood in your urine.   You have uncontrolled diarrhea or vomiting.   You have a fever or persistent symptoms for more than 3 days.  You have a fever and your symptoms suddenly get worse.   You are being physically or sexually abused.  MAKE SURE YOU:  Understand these instructions.  Will watch your condition.  Will get help if you are not doing well or get worse. Document Released: 08/26/2004 Document Revised: 02/13/2014 Document Reviewed: 01/19/2012 [facility] Patient Information 2015 [place], [place]. This information is not intended to replace advice given to you by your health care provider. Make sure you discuss any questions you have with your health care provider. Bacterial Vaginosis Bacterial vaginosis is a vaginal infection that occurs when the normal balance of  bacteria in the vagina is disrupted. It results from an overgrowth of certain bacteria. This is the most common vaginal infection in women of childbearing age. Treatment is important to prevent complications, especially in pregnant women, as it can cause a premature delivery. CAUSES  Bacterial vaginosis is caused by an increase in harmful bacteria that are normally present in smaller amounts in the vagina. Several different kinds of bacteria can cause bacterial vaginosis. However, the reason that the condition develops is not fully understood. RISK FACTORS Certain activities or behaviors can put you at an increased risk of developing bacterial vaginosis, including:  Having a new sex partner or multiple sex partners.  Douching.  Using an intrauterine device (IUD) for contraception. Women do not get bacterial vaginosis from toilet seats, bedding, swimming pools, or contact with objects around them. SIGNS AND SYMPTOMS  Some women with bacterial vaginosis have no signs or symptoms. Common symptoms include:  Grey vaginal discharge.  A fishlike odor with discharge, especially after sexual intercourse.  Itching or burning of the vagina and vulva.  Burning or pain with urination. DIAGNOSIS  Your health care provider will take a medical history and examine the vagina for signs of bacterial vaginosis. A sample of vaginal fluid may be taken. Your health care provider will look at this sample under a microscope to check for bacteria and abnormal cells. A vaginal pH test may also be done.  TREATMENT  Bacterial vaginosis may be treated with antibiotic medicines. These may be given in the form of a pill or a vaginal cream. A second round of antibiotics may be prescribed if the condition comes back after treatment.  HOME CARE INSTRUCTIONS   Only take over-the-counter or prescription medicines as directed by your health care provider.  If antibiotic medicine was prescribed, take it as directed. Make  sure you finish it even if you start to feel better.  Do not have sex until treatment is completed.  Tell all sexual partners that you have a vaginal infection. They should see their health care provider and be treated if they have problems, such as a mild rash or itching.  Practice safe sex by using condoms and only having one sex partner. SEEK MEDICAL CARE IF:   Your symptoms are not improving after 3 days of treatment.  You have increased discharge or pain.  You have a fever. MAKE SURE YOU:   Understand these instructions.  Will watch your condition.  Will get help right away if you are not doing well or get worse. FOR MORE INFORMATION  Centers for Disease Control and Prevention, Division of STD Prevention: SolutionApps.[employer].za American Sexual Health Association (ASHA): www.ashastd.org  Document Released: 09/29/2005 Document Revised: 07/20/2013 Document Reviewed: 05/11/2013 [employer] Patient Information  2015 [employer]. This information is not intended to replace advice given to you by your health care provider. Make sure you discuss any questions you have with your health care provider.  Constipation Constipation is when a person has fewer than three bowel movements a week, has difficulty having a bowel movement, or has stools that are dry, hard, or larger than normal. As people grow older, constipation is more common. If you try to fix constipation with medicines that make you have a bowel movement (laxatives), the problem may get worse. Long-term laxative use may cause the muscles of the colon to become weak. A low-fiber diet, not taking in enough fluids, and taking certain medicines may make constipation worse.  CAUSES   Certain medicines, such as antidepressants, pain medicine, iron supplements, antacids, and water pills.   Certain diseases, such as diabetes, irritable bowel syndrome (IBS), thyroid disease, or depression.   Not drinking enough water.   Not eating enough  fiber-rich foods.   Stress or travel.   Lack of physical activity or exercise.   Ignoring the urge to have a bowel movement.   Using laxatives too much.  SIGNS AND SYMPTOMS   Having fewer than three bowel movements a week.   Straining to have a bowel movement.   Having stools that are hard, dry, or larger than normal.   Feeling full or bloated.   Pain in the lower abdomen.   Not feeling relief after having a bowel movement.  DIAGNOSIS  Your health care provider will take a medical history and perform a physical exam. Further testing may be done for severe constipation. Some tests may include:  A barium enema X-ray to examine your rectum, colon, and, sometimes, your small intestine.   A sigmoidoscopy to examine your lower colon.   A colonoscopy to examine your entire colon. TREATMENT  Treatment will depend on the severity of your constipation and what is causing it. Some dietary treatments include drinking more fluids and eating more fiber-rich foods. Lifestyle treatments may include regular exercise. If these diet and lifestyle recommendations do not help, your health care provider may recommend taking over-the-counter laxative medicines to help you have bowel movements. Prescription medicines may be prescribed if over-the-counter medicines do not work.  HOME CARE INSTRUCTIONS   Eat foods that have a lot of fiber, such as fruits, vegetables, whole grains, and beans.  Limit foods high in fat and processed sugars, such as french fries, hamburgers, cookies, candies, and soda.   A fiber supplement may be added to your diet if you cannot get enough fiber from foods.   Drink enough fluids to keep your urine clear or pale yellow.   Exercise regularly or as directed by your health care provider.   Go to the restroom when you have the urge to go. Do not hold it.   Only take over-the-counter or prescription medicines as directed by your health care provider. Do  not take other medicines for constipation without talking to your health care provider first.  SEEK IMMEDIATE MEDICAL CARE IF:   You have bright red blood in your stool.   Your constipation lasts for more than 4 days or gets worse.   You have abdominal or rectal pain.   You have thin, pencil-like stools.   You have unexplained weight loss. MAKE SURE YOU:   Understand these instructions.  Will watch your condition.  Will get help right away if you are not doing well or get worse. Document Released: 06/27/2004  Document Revised: 10/04/2013 Document Reviewed: 07/11/2013 [facility] Patient Information 2015 [place], [place]. This information is not intended to replace advice given to you by your health care provider. Make sure you discuss any questions you have with your health care provider.

## 2015-02-09 NOTE — [facility] Note (Signed)
Pt states here for r ovarian pain. Hx ovarian cysts. Pelvic/r ovary pain x1 week. No abnormal vaginal discharge or bleeding.

## 2015-02-09 NOTE — [facility] Note (Signed)
Pt presents to [facility] with complaints of pelvic pain for a week. States that she is not taking any birth control.

## 2015-02-09 NOTE — [facility] Provider Note (Signed)
Chief Complaint: Pelvic Pain   First Provider Initiated Contact with Patient 02/09/15 [phone]      SUBJECTIVE HPI: Lori Mcknight is a 29 y.o. G0P0 who presents to maternity admissions reporting RLQ pain with onset yesterday. She has hx of chronic pelvic pain and irregular vaginal bleeding with PCOS.  She was last seen in [facility] in 2015 and called earlier this month for a refill of Tramadol for pain but was told she needed to make a new appointment. She has appointment in [facility] on 02/19/15 to follow up on chronic pelvic pain.  She reports this pain is more severe with onset yesterday. She has taken ibuprofen 800 mg today without improvement in pain.  LMP in mid March, and she took Megace for a couple of weeks after this to make bleeding stop.  She has not had vaginal bleeding since then.  She reports some constipation, with painful bowel movements on some days, but other days bowel movements are soft. She is trying high fiber diet to improve this and it does help. She denies vaginal bleeding, vaginal itching/burning, urinary symptoms, h/a, dizziness, n/v, or fever/chills.     Pelvic Pain The patient's primary symptoms include pelvic pain. This is a recurrent problem. The current episode started yesterday. The problem occurs intermittently. The problem has been waxing and waning. The pain is severe. The problem affects the right side. She is not pregnant. Associated symptoms include abdominal pain, constipation and dysuria. Pertinent negatives include no back pain, chills, diarrhea, fever, flank pain, frequency, headaches, nausea, urgency or vomiting. The symptoms are aggravated by bowel movements, activity, urinating and tactile pressure. She has tried NSAIDs for the symptoms. The treatment provided no relief. No, her partner does not have an STD. She uses nothing for contraception. Her menstrual history has been irregular.    Past Medical History  Diagnosis Date  . PCOS (polycystic ovarian syndrome)   . Asthma    . Obese   . Urinary tract infection   . Chlamydia   . Genital herpes   . Morbid obesity   . Shortness of breath     on exertion from weight  . Hypertension     no meds since April  . Chest pain of uncertain etiology     intermittent left side chest pain  . Anemia     history of anemia   Past Surgical History  Procedure Laterality Date  . Wisdom tooth extraction    . No past surgeries    . Hysteroscopy w/d&c N/A 04/06/2014    Procedure: DILATATION AND CURETTAGE /HYSTEROSCOPY ;  Surgeon: Tereso Newcomer, MD;  Location: [facility];  Service: Gynecology;  Laterality: N/A;  . Dilation and curettage of uterus     History   Social History  . Marital Status: Single    Spouse Name: N/A  . Number of Children: N/A  . Years of Education: N/A   Occupational History  . Not on file.   Social History Main Topics  . Smoking status: Former Smoker    Quit date: 01/04/2011  . Smokeless tobacco: Never Used     Comment: quit 2009  . Alcohol Use: Yes     Comment: occasonal   . Drug Use: No  . Sexual Activity: Yes    Birth Control/ Protection: None   Other Topics Concern  . Not on file   Social History Narrative   No current facility-administered medications on file prior to encounter.   Current Outpatient Prescriptions on File  Prior to Encounter  Medication Sig Dispense Refill  . cyclobenzaprine (FLEXERIL) 10 MG tablet TAKE ONE TABLET BY MOUTH EVERY 8 HOURS AS NEEDED FORMUSCLE SPASMS 30 tablet 0  . megestrol (MEGACE) 40 MG tablet Take 1 tablet (40 mg total) by mouth daily. 30 tablet 3  . albuterol (PROVENTIL HFA;VENTOLIN HFA) 108 (90 BASE) MCG/ACT inhaler Inhale 2 puffs into the lungs every 6 (six) hours as needed for wheezing or shortness of breath (asthma).      No Known Allergies  Review of Systems  Constitutional: Negative for fever, chills and malaise/fatigue.  Eyes: Negative for blurred vision.  Respiratory: Negative for cough and shortness of breath.   Cardiovascular:  Negative for chest pain.  Gastrointestinal: Positive for abdominal pain and constipation. Negative for heartburn, nausea, vomiting and diarrhea.  Genitourinary: Positive for dysuria and pelvic pain. Negative for urgency, frequency and flank pain.  Musculoskeletal: Negative.  Negative for back pain.  Neurological: Negative for dizziness and headaches.  Psychiatric/Behavioral: Negative for depression.    OBJECTIVE Blood pressure 133/68, pulse 119, temperature 98.3 F (36.8 C), resp. rate 18, height 5\' 6"  (1.676 m), weight 166.584 kg (367 lb 4 oz). GENERAL: Well-developed, well-nourished female in no acute distress.  EYES: normal sclera/conjunctiva; no lid-lag HENT: Atraumatic, normocephalic HEART: normal rate RESP: normal effort ABDOMEN: Soft, tenderness in RUQ, R mid abdomen, and RLQ, no rebound tenderness or guarding MUSCULOSKELETAL: Normal ROM EXTREMITIES: Nontender, no edema NEURO/PSYCH: Alert and oriented, appropriate affect  PELVIC EXAM: Cervix pink, visually closed, without lesion, scant white creamy discharge, vaginal walls and external genitalia normal Bimanual exam: Cervix 0/long/high, firm, anterior, neg CMT, uterus nontender, nonenlarged, adnexa without tenderness, enlargement, or mass   LAB RESULTS Results for orders placed or performed during the hospital encounter of 02/09/15 (from the past 24 hour(s))  Urinalysis, Routine w reflex microscopic     Status: Abnormal   Collection Time: 02/09/15  9:06 AM  Result Value Ref Range   Color, Urine YELLOW YELLOW   APPearance CLEAR CLEAR   Specific Gravity, Urine >1.030 (H) 1.005 - 1.030   pH 5.5 5.0 - 8.0   Glucose, UA NEGATIVE NEGATIVE mg/dL   Hgb urine dipstick NEGATIVE NEGATIVE   Bilirubin Urine NEGATIVE NEGATIVE   Ketones, ur NEGATIVE NEGATIVE mg/dL   Protein, ur NEGATIVE NEGATIVE mg/dL   Urobilinogen, UA 0.2 0.0 - 1.0 mg/dL   Nitrite NEGATIVE NEGATIVE   Leukocytes, UA NEGATIVE NEGATIVE  Pregnancy, urine POC      Status: None   Collection Time: 02/09/15  9:27 AM  Result Value Ref Range   Preg Test, Ur NEGATIVE NEGATIVE  Wet prep, genital     Status: Abnormal   Collection Time: 02/09/15  9:40 AM  Result Value Ref Range   Yeast Wet Prep HPF POC NONE SEEN NONE SEEN   Trich, Wet Prep NONE SEEN NONE SEEN   Clue Cells Wet Prep HPF POC MODERATE (A) NONE SEEN   WBC, Wet Prep HPF POC NONE SEEN NONE SEEN  CBC     Status: Abnormal   Collection Time: 02/09/15  9:40 AM  Result Value Ref Range   WBC 7.0 4.0 - 10.5 K/uL   RBC 4.22 3.87 - 5.11 MIL/uL   Hemoglobin 11.8 (L) 12.0 - 15.0 g/dL   HCT 16.136.7 09.636.0 - 04.546.0 %   MCV 87.0 78.0 - 100.0 fL   MCH 28.0 26.0 - 34.0 pg   MCHC 32.2 30.0 - 36.0 g/dL   RDW 40.914.2 81.111.5 - 91.415.5 %  Platelets 283 150 - 400 K/uL    IMAGING US Transvaginal Non-ob  02/09/2015   CLINICAL DATA:  Chronic pelvic pain for 1 week. PC OS. Morbid obesity.  EXAM: ULTRASOUND PELVIS TRANSVAGINAL  TECHNIQUE: Transvaginal ultrasound examination of the pelvis was performed including evaluation of the uterus, ovaries, adnexal regions, and pelvic cul-de-sac.  COMPARISON:  12/12/2014  FINDINGS: Uterus  Measurements: 8.3 x 5.6 x 6.5 cm. No fibroids or other mass visualized.  Endometrium  Thickness: 17 mm.  Appears somewhat heterogeneous.  Right ovary  Measurements: Not visualized. No adnexal mass.  Left ovary  Measurements: 2.6 x 1.4 x 2.2 cm. Normal appearance/no adnexal mass.  Other findings:  Trace free fluid noted within the pelvis.  IMPRESSION: 1. No acute findings and no explanation for patient's chronic pelvic pain. 2. The endometrium appears upper limits of normal in thickness measuring 17 mm. The echotexture is somewhat heterogeneous. This is likely related to phase of menstrual cycle. If there is a concern for abnormal uterine bleeding then consider follow-up by [place] in 6-8 weeks, during the week immediately following menses (exam timing is critical).   Electronically Signed   By: Signa Kell M.D.   On:  02/09/2015 10:30    ASSESSMENT 1. Constipation, unspecified constipation type   2. Chronic pelvic pain in female   3. Endometrial thickening on ultra sound   4. PCOS (polycystic ovarian syndrome)   5. BV (bacterial vaginosis)     PLAN Discharge home Flagyl 500 mg BID x 7 days Renewed Rx for ibuprofen 600 mg Q 6 hours PRN and tramadol 50 mg Q 6 hours PRN (x20 tabs) Colace 100 mg BID PRN Increase PO fluids and continue high fiber diet   Follow-up Information    Follow up with [facility].   Specialty:  Obstetrics and Gynecology   Why:  As scheduled on 01/20/15   Contact information:   [address] [place] [address] [phone]      Follow up with [facility] [place] MATERNITY ADMISSIONS.   Why:  As needed for emergencies   Contact information:   [address] [id number] mc [place] [address] [phone]      Sharen Counter Certified Nurse-Midwife 02/09/2015  10:57 AM

## 2015-02-10 LAB — HIV ANTIBODY (ROUTINE TESTING W REFLEX): HIV Screen 4th Generation wRfx: NONREACTIVE

## 2015-02-12 LAB — GC/CHLAMYDIA PROBE AMP (~~LOC~~) NOT AT ARMC
CHLAMYDIA, DNA PROBE: NEGATIVE
NEISSERIA GONORRHEA: NEGATIVE

## 2015-02-19 ENCOUNTER — Ambulatory Visit (INDEPENDENT_AMBULATORY_CARE_PROVIDER_SITE_OTHER): Payer: Self-pay | Admitting: Family Medicine

## 2015-02-19 ENCOUNTER — Encounter: Payer: Self-pay | Admitting: Family Medicine

## 2015-02-19 VITALS — BP 104/68 | HR 124 | Temp 98.5°F | Wt 363.8 lb

## 2015-02-19 DIAGNOSIS — N939 Abnormal uterine and vaginal bleeding, unspecified: Secondary | ICD-10-CM

## 2015-02-19 DIAGNOSIS — R102 Pelvic and perineal pain: Secondary | ICD-10-CM

## 2015-02-19 MED ORDER — FLUCONAZOLE 150 MG PO TABS: 150.0000 mg | ORAL_TABLET | Freq: Once | ORAL | Status: DC

## 2015-02-19 NOTE — Progress Notes (Signed)
   Subjective:    Patient ID: Lori Mcknight, female    DOB: Jun 15, 1986, 29 y.o.   MRN: [id number]  HPI Pt seen for pelvic pain.  Patient had hysteroscopy with D&C on 03/2014 for thickened endometrium.  Since that time, has had pelvic pain.  Mostly intermittent.  Worse with menses.  Has been on megace mostly during the past year, although stopped in January to have menses in February and March.  Started bleeding continuously and was restarted on megace a few days ago.  No longer bleeding.  Pain worse on right.  Sharp aching.  Using ibuprofen, which helps a little.  No other palliating or provoking factors.  Also has PCOS.   Review of Systems  Constitutional: Negative for fever and chills.  Gastrointestinal: Negative for nausea, vomiting, abdominal pain, diarrhea and constipation.  Genitourinary: Negative for dysuria, urgency, frequency, decreased urine volume, vaginal bleeding, vaginal discharge, difficulty urinating and vaginal pain.   I have reviewed the patients past medical, family, and social history.  I have reviewed the patient's medication list and allergies.      Objective:   Physical Exam  Constitutional: She is oriented to person, place, and time. She appears well-developed and well-nourished.  HENT:  Head: Normocephalic and atraumatic.  Abdominal: Soft. Bowel sounds are normal. She exhibits no distension and no mass. There is no tenderness. There is no rebound and no guarding. Hernia confirmed negative in the right inguinal area and confirmed negative in the left inguinal area.  Genitourinary: There is no rash, tenderness, lesion or injury on the right labia. There is no rash, tenderness, lesion or injury on the left labia. Uterus is not deviated, not enlarged, not fixed and not tender. Cervix exhibits no motion tenderness, no discharge and no friability. Right adnexum displays tenderness. Right adnexum displays no mass and no fullness. Left adnexum displays no mass, no tenderness and  no fullness. No erythema, tenderness or bleeding in the vagina. No foreign body around the vagina. No signs of injury around the vagina. No vaginal discharge found.  Lymphadenopathy:       Right: No inguinal adenopathy present.       Left: No inguinal adenopathy present.  Neurological: She is alert and oriented to person, place, and time.  Skin: Skin is warm and dry.  Psychiatric: She has a normal mood and affect. Her behavior is normal. Judgment and thought content normal.      Assessment & Plan:   Problem List Items Addressed This Visit    Pelvic pain in female   Obesity, Class III, BMI 40-49.9 (morbid obesity)   Abnormal uterine bleeding (AUB) - Primary     Discussed treatments - recommended IUD, which will likely help with both the patient's bleeding and pelvic pain.  There is a possible component endometriosis, but would need laparoscopic surgery to evaluate - patient does not have insurance, so this would be prohibitive.  Out of pocket expense to patient for IUD insertion would be $1200 and would be more affordable to the patient at the health department - patient referred there.    F/u 3 months after IUD inserted.

## 2015-03-16 ENCOUNTER — Other Ambulatory Visit: Payer: Self-pay | Admitting: Obstetrics & Gynecology

## 2015-06-05 ENCOUNTER — Other Ambulatory Visit: Payer: Self-pay | Admitting: Advanced Practice Midwife

## 2015-06-05 ENCOUNTER — Other Ambulatory Visit: Payer: Self-pay | Admitting: Obstetrics & Gynecology

## 2015-06-06 ENCOUNTER — Telehealth: Payer: Self-pay | Admitting: General Practice

## 2015-06-06 NOTE — Telephone Encounter (Signed)
Per epic, patient has requested refills of flexeril and tramadol. Per chart review, patient was referred to [facility] in May to seek IUD insertion. Patient has not followed up with the clinic since then. Called patient, no answer- left message stating we are trying to reach you regarding your refill requests, please call us back at the clinics

## 2015-06-07 ENCOUNTER — Other Ambulatory Visit: Payer: Self-pay

## 2015-06-07 MED ORDER — CYCLOBENZAPRINE HCL 10 MG PO TABS: ORAL_TABLET | ORAL | Status: DC

## 2015-06-07 NOTE — Telephone Encounter (Signed)
Called pt and she informed me that she is needing a refill on her pain medication and flexeril.  I informed pt that per Dr. Macon Large note she recommended that pt take ibuprofen for her pain.  I asked pt if she has had her IUD inserted.  Pt stated "no, I have changed my mind about that, I no longer want that."  I advised pt to schedule an appt to speak with a provider concerning management of her bleeding and pain with her periods.   I informed pt to take ibuprofen for her pain and that I would contact Dr. Macon Large concerning refill on her Flexeril.  Pt stated that she would call and schedule an appt for follow up.  Pt stated understanding to the recommendations with no further questions.

## 2015-06-07 NOTE — Telephone Encounter (Signed)
Per Dr. Jolayne Panther, covering for Dr. Macon Large, pt can have a refill on flexeril.   Called pt and informed her that the provider has refilled her Flexeril and has been sent to her [facility] pharmacy off [address]   Pt agreed.

## 2015-08-23 ENCOUNTER — Encounter: Payer: Self-pay | Admitting: Family Medicine

## 2015-08-23 ENCOUNTER — Ambulatory Visit (INDEPENDENT_AMBULATORY_CARE_PROVIDER_SITE_OTHER): Payer: Self-pay | Admitting: Family Medicine

## 2015-08-23 VITALS — BP 165/97 | HR 93 | Temp 98.6°F | Ht 66.0 in | Wt 354.6 lb

## 2015-08-23 DIAGNOSIS — Z113 Encounter for screening for infections with a predominantly sexual mode of transmission: Secondary | ICD-10-CM

## 2015-08-23 DIAGNOSIS — E282 Polycystic ovarian syndrome: Secondary | ICD-10-CM

## 2015-08-23 DIAGNOSIS — Z3202 Encounter for pregnancy test, result negative: Secondary | ICD-10-CM

## 2015-08-23 DIAGNOSIS — Z202 Contact with and (suspected) exposure to infections with a predominantly sexual mode of transmission: Secondary | ICD-10-CM

## 2015-08-23 LAB — POCT PREGNANCY, URINE: PREG TEST UR: NEGATIVE

## 2015-08-23 LAB — GLUCOSE, CAPILLARY: Glucose-Capillary: 112 mg/dL — ABNORMAL HIGH (ref 65–99)

## 2015-08-23 MED ORDER — METFORMIN HCL 500 MG PO TABS: 500.0000 mg | ORAL_TABLET | Freq: Two times a day (BID) | ORAL | Status: DC

## 2015-08-23 NOTE — Progress Notes (Signed)
States stopped Megace in July, had one period in July then none. States at times gets shaky, then lightheaded, cold sweat chills, out of breath. States she sits down and after eats candy or something feels a little better. States diabetes runs in her family. Random cbg checked=112. States ate fruit on way here. UPT negative today.

## 2015-08-23 NOTE — Progress Notes (Signed)
   Subjective:    Patient ID: Lori Mcknight, female    DOB: [birth year], 29 y.o.   MRN: [id number]  HPI Patient seen for irregular period.  Has history of heavy bleeding with prolonged period.  Patient had hysteroscopy with D&C on 03/2014 for thickened endometrium, which was normal.  Was placed on megace, which was helpful for bleeding.  Has been on megace intermittently.   Was last on it for 02/09/2015 until 04/21/2015. After stopping the megace, she had a withdrawal period.  Her periods have not resumed, however she's had some spotting intermittently that range from 1-4 days. The interval between spotting is anywhere from 4-30 days.  She reports STD exposure.  Review of Systems  Constitutional: Negative for fever, chills and fatigue.  Gastrointestinal: Negative for nausea, vomiting, abdominal pain and diarrhea.  Genitourinary: Negative for dysuria, urgency, frequency, vaginal bleeding, vaginal discharge, vaginal pain and pelvic pain.      Objective:   Physical Exam  Constitutional: She is oriented to person, place, and time. She appears well-developed and well-nourished.  HENT:  Head: Normocephalic and atraumatic.  Right Ear: External ear normal.  Left Ear: External ear normal.  Abdominal: Soft. Bowel sounds are normal. She exhibits no distension and no mass. There is no tenderness. There is no rebound and no guarding.  Neurological: She is alert and oriented to person, place, and time.  Skin: Skin is warm and dry. No rash noted. No erythema. No pallor.  Psychiatric: She has a normal mood and affect. Her behavior is normal. Judgment and thought content normal.      Assessment & Plan:  1. PCOS (polycystic ovarian syndrome) Irregular periods may simply be due to recent megace and PCOS with irregular periods.  Will start on metformin.  Patient would like to get pregnant, does not want OCPs or other contraception.  UPT here negative  2. STD exposure - HIV antibody - Urine cytology  ancillary only

## 2015-08-23 NOTE — Patient Instructions (Signed)
Start taking metformin at dinner time (with food) for 1 week, then start taking it with breakfast as well.

## 2015-08-24 LAB — URINE CYTOLOGY ANCILLARY ONLY
Chlamydia: NEGATIVE
Neisseria Gonorrhea: NEGATIVE

## 2015-08-24 LAB — HIV ANTIBODY (ROUTINE TESTING W REFLEX): HIV 1&2 Ab, 4th Generation: NONREACTIVE

## 2015-10-03 ENCOUNTER — Encounter (HOSPITAL_COMMUNITY): Payer: Self-pay

## 2015-10-03 ENCOUNTER — Emergency Department (HOSPITAL_COMMUNITY)
Admission: EM | Admit: 2015-10-03 | Discharge: 2015-10-03 | Disposition: A | Discharge status: Home / Self Care | Payer: Self-pay | Attending: Emergency Medicine | Admitting: Emergency Medicine

## 2015-10-03 ENCOUNTER — Emergency Department (HOSPITAL_COMMUNITY): Payer: Self-pay

## 2015-10-03 DIAGNOSIS — Z7984 Long term (current) use of oral hypoglycemic drugs: Secondary | ICD-10-CM | POA: Insufficient documentation

## 2015-10-03 DIAGNOSIS — R102 Pelvic and perineal pain: Secondary | ICD-10-CM

## 2015-10-03 DIAGNOSIS — J45909 Unspecified asthma, uncomplicated: Secondary | ICD-10-CM | POA: Insufficient documentation

## 2015-10-03 DIAGNOSIS — Z87891 Personal history of nicotine dependence: Secondary | ICD-10-CM | POA: Insufficient documentation

## 2015-10-03 DIAGNOSIS — N760 Acute vaginitis: Secondary | ICD-10-CM | POA: Insufficient documentation

## 2015-10-03 DIAGNOSIS — R52 Pain, unspecified: Secondary | ICD-10-CM

## 2015-10-03 DIAGNOSIS — Z3202 Encounter for pregnancy test, result negative: Secondary | ICD-10-CM | POA: Insufficient documentation

## 2015-10-03 DIAGNOSIS — Z8619 Personal history of other infectious and parasitic diseases: Secondary | ICD-10-CM | POA: Insufficient documentation

## 2015-10-03 DIAGNOSIS — E282 Polycystic ovarian syndrome: Secondary | ICD-10-CM | POA: Insufficient documentation

## 2015-10-03 DIAGNOSIS — I10 Essential (primary) hypertension: Secondary | ICD-10-CM | POA: Insufficient documentation

## 2015-10-03 DIAGNOSIS — R079 Chest pain, unspecified: Secondary | ICD-10-CM | POA: Insufficient documentation

## 2015-10-03 DIAGNOSIS — Z9889 Other specified postprocedural states: Secondary | ICD-10-CM | POA: Insufficient documentation

## 2015-10-03 DIAGNOSIS — Z862 Personal history of diseases of the blood and blood-forming organs and certain disorders involving the immune mechanism: Secondary | ICD-10-CM | POA: Insufficient documentation

## 2015-10-03 DIAGNOSIS — Z87440 Personal history of urinary (tract) infections: Secondary | ICD-10-CM | POA: Insufficient documentation

## 2015-10-03 LAB — URINALYSIS, ROUTINE W REFLEX MICROSCOPIC
BILIRUBIN URINE: NEGATIVE
Glucose, UA: NEGATIVE mg/dL
HGB URINE DIPSTICK: NEGATIVE
Leukocytes, UA: NEGATIVE
NITRITE: NEGATIVE
PH: 7 (ref 5.0–8.0)
Protein, ur: NEGATIVE mg/dL
Specific Gravity, Urine: 1.015 (ref 1.005–1.030)

## 2015-10-03 LAB — WET PREP, GENITAL
Sperm: NONE SEEN
Trich, Wet Prep: NONE SEEN
Yeast Wet Prep HPF POC: NONE SEEN

## 2015-10-03 LAB — PREGNANCY, URINE: Preg Test, Ur: NEGATIVE

## 2015-10-03 MED ORDER — IBUPROFEN 400 MG PO TABS
400.0000 mg | ORAL_TABLET | Freq: Once | ORAL | Status: AC
  Administered 2015-10-03: 400 mg via ORAL
  Filled 2015-10-03: qty 1

## 2015-10-03 MED ORDER — METRONIDAZOLE 500 MG PO TABS: 500.0000 mg | ORAL_TABLET | Freq: Two times a day (BID) | ORAL | Status: DC

## 2015-10-03 NOTE — ED Notes (Signed)
Pt reports pelvic pain x 1 week.  Pt says has some spotting but reports is not new.  LMP was June 29.  Periods are irregular due to PCOS.

## 2015-10-03 NOTE — Discharge Instructions (Signed)
Pelvic Pain, Female Female pelvic pain can be caused by many different things and start from a variety of places. Pelvic pain refers to pain that is located in the lower half of the abdomen and between your hips. The pain may occur over a short period of time (acute) or may be reoccurring (chronic). The cause of pelvic pain may be related to disorders affecting the female reproductive organs (gynecologic), but it may also be related to the bladder, kidney stones, an intestinal complication, or muscle or skeletal problems. Getting help right away for pelvic pain is important, especially if there has been severe, sharp, or a sudden onset of unusual pain. It is also important to get help right away because some types of pelvic pain can be life threatening.  CAUSES  Below are only some of the causes of pelvic pain. The causes of pelvic pain can be in one of several categories.   Gynecologic.  Pelvic inflammatory disease.  Sexually transmitted infection.  Ovarian cyst or a twisted ovarian ligament (ovarian torsion).  Uterine lining that grows outside the uterus (endometriosis).  Fibroids, cysts, or tumors.  Ovulation.  Pregnancy.  Pregnancy that occurs outside the uterus (ectopic pregnancy).  Miscarriage.  Labor.  Abruption of the placenta or ruptured uterus.  Infection.  Uterine infection (endometritis).  Bladder infection.  Diverticulitis.  Miscarriage related to a uterine infection (septic abortion).  Bladder.  Inflammation of the bladder (cystitis).  Kidney stone(s).  Gastrointestinal.  Constipation.  Diverticulitis.  Neurologic.  Trauma.  Feeling pelvic pain because of mental or emotional causes (psychosomatic).  Cancers of the bowel or pelvis. EVALUATION  Your caregiver will want to take a careful history of your concerns. This includes recent changes in your health, a careful gynecologic history of your periods (menses), and a sexual history. Obtaining  your family history and medical history is also important. Your caregiver may suggest a pelvic exam. A pelvic exam will help identify the location and severity of the pain. It also helps in the evaluation of which organ system may be involved. In order to identify the cause of the pelvic pain and be properly treated, your caregiver may order tests. These tests may include:   A pregnancy test.  Pelvic ultrasonography.  An X-ray exam of the abdomen.  A urinalysis or evaluation of vaginal discharge.  Blood tests. HOME CARE INSTRUCTIONS   Only take over-the-counter or prescription medicines for pain, discomfort, or fever as directed by your caregiver.   Rest as directed by your caregiver.   Eat a balanced diet.   Drink enough fluids to make your urine clear or pale yellow, or as directed.   Avoid sexual intercourse if it causes pain.   Apply warm or cold compresses to the lower abdomen depending on which one helps the pain.   Avoid stressful situations.   Keep a journal of your pelvic pain. Write down when it started, where the pain is located, and if there are things that seem to be associated with the pain, such as food or your menstrual cycle.  Follow up with your caregiver as directed.  SEEK MEDICAL CARE IF:  Your medicine does not help your pain.  You have abnormal vaginal discharge. SEEK IMMEDIATE MEDICAL CARE IF:   You have heavy bleeding from the vagina.   Your pelvic pain increases.   You feel light-headed or faint.   You have chills.   You have pain with urination or blood in your urine.   You have uncontrolled  diarrhea or vomiting.   You have a fever or persistent symptoms for more than 3 days.  You have a fever and your symptoms suddenly get worse.   You are being physically or sexually abused.   This information is not intended to replace advice given to you by your health care provider. Make sure you discuss any questions you have with  your health care provider.   Document Released: 08/26/2004 Document Revised: 06/20/2015 Document Reviewed: 01/19/2012 Elsevier Interactive Patient Education 2016 Elsevier Inc. Bacterial Vaginosis Bacterial vaginosis is a vaginal infection that occurs when the normal balance of bacteria in the vagina is disrupted. It results from an overgrowth of certain bacteria. This is the most common vaginal infection in women of childbearing age. Treatment is important to prevent complications, especially in pregnant women, as it can cause a premature delivery. CAUSES  Bacterial vaginosis is caused by an increase in harmful bacteria that are normally present in smaller amounts in the vagina. Several different kinds of bacteria can cause bacterial vaginosis. However, the reason that the condition develops is not fully understood. RISK FACTORS Certain activities or behaviors can put you at an increased risk of developing bacterial vaginosis, including:  Having a new sex partner or multiple sex partners.  Douching.  Using an intrauterine device (IUD) for contraception. Women do not get bacterial vaginosis from toilet seats, bedding, swimming pools, or contact with objects around them. SIGNS AND SYMPTOMS  Some women with bacterial vaginosis have no signs or symptoms. Common symptoms include:  Grey vaginal discharge.  A fishlike odor with discharge, especially after sexual intercourse.  Itching or burning of the vagina and vulva.  Burning or pain with urination. DIAGNOSIS  Your health care provider will take a medical history and examine the vagina for signs of bacterial vaginosis. A sample of vaginal fluid may be taken. Your health care provider will look at this sample under a microscope to check for bacteria and abnormal cells. A vaginal pH test may also be done.  TREATMENT  Bacterial vaginosis may be treated with antibiotic medicines. These may be given in the form of a pill or a vaginal cream. A  second round of antibiotics may be prescribed if the condition comes back after treatment. Because bacterial vaginosis increases your risk for sexually transmitted diseases, getting treated can help reduce your risk for chlamydia, gonorrhea, HIV, and herpes. HOME CARE INSTRUCTIONS   Only take over-the-counter or prescription medicines as directed by your health care provider.  If antibiotic medicine was prescribed, take it as directed. Make sure you finish it even if you start to feel better.  Tell all sexual partners that you have a vaginal infection. They should see their health care provider and be treated if they have problems, such as a mild rash or itching.  During treatment, it is important that you follow these instructions:  Avoid sexual activity or use condoms correctly.  Do not douche.  Avoid alcohol as directed by your health care provider.  Avoid breastfeeding as directed by your health care provider. SEEK MEDICAL CARE IF:   Your symptoms are not improving after 3 days of treatment.  You have increased discharge or pain.  You have a fever. MAKE SURE YOU:   Understand these instructions.  Will watch your condition.  Will get help right away if you are not doing well or get worse. FOR MORE INFORMATION  Centers for Disease Control and Prevention, Division of STD Prevention: www.cdc.gov/std American Sexual Health Association (ASHA): www.ashastd.org      This information is not intended to replace advice given to you by your health care provider. Make sure you discuss any questions you have with your health care provider.   Document Released: 09/29/2005 Document Revised: 10/20/2014 Document Reviewed: 05/11/2013 Elsevier Interactive Patient Education 2016 Elsevier Inc.  

## 2015-10-03 NOTE — ED Provider Notes (Signed)
Lori Mcknight is a 29 y.o. morbidly obese female G0P0 with hx of PCOS and HSV. Current sex partner x 3 years. Patient complains of pelvic pain.   THIS IS A SHARED VISIT WITH DR. Effie ShyWENTZ  Pelvic exam: External genitalia without lesions, scant bloody d/c vaginal vault. Cervix without lesions. Positive CMT, right adnexal tenderness. Unable to palpate uterus due to patient habitus.   Cultures for GC, Chlamydia collected, wet prep collected.    Lori M Neese, NP 10/03/15 1610  Lori BaleElliott Wentz, MD 10/03/15 [phone]

## 2015-10-03 NOTE — ED Provider Notes (Signed)
CSN: [id number]     Arrival date & time 10/03/15  1454 History   First MD Initiated Contact with Patient 10/03/15 1513     Chief Complaint  Patient presents with  . Pelvic Pain     (Consider location/radiation/quality/duration/timing/severity/associated sxs/prior Treatment) HPI  Lori Mcknight is a 29 y.o. female here for evaluation of pelvic pain, right greater than left, for 1 week. The pain is ongoing and constant. Has not taken anything for it yet. She was recently started on metformin to treat her PCP. She's not had a problem with her ovaries recently that she knows of. She denies fever, chills, nausea, vomiting, weakness, dizziness. She is having vaginal spotting currently. Her last menstrual cycle was about 6 months ago. She denies dysuria, urinary frequency, diarrhea or constipation. There are no other known modifying factors.    Past Medical History  Diagnosis Date  . PCOS (polycystic ovarian syndrome)   . Asthma   . Obese   . Urinary tract infection   . Chlamydia   . Genital herpes   . Morbid obesity (HCC)   . Shortness of breath     on exertion from weight  . Hypertension     no meds since April  . Chest pain of uncertain etiology     intermittent left side chest pain  . Anemia     history of anemia   Past Surgical History  Procedure Laterality Date  . Wisdom tooth extraction    . No past surgeries    . Hysteroscopy w/d&c N/A 04/06/2014    Procedure: DILATATION AND CURETTAGE /HYSTEROSCOPY ;  Surgeon: Tereso Newcomer, MD;  Location: [facility];  Service: Gynecology;  Laterality: N/A;  . Dilation and curettage of uterus     Family History  Problem Relation Age of Onset  . Heart disease Mother   . Diabetes Father   . Heart disease Father   . Heart disease Maternal Grandmother   . Diabetes Maternal Grandfather   . Heart disease Maternal Grandfather   . Sleep apnea Mother    Social History  Substance Use Topics  . Smoking status: Former Smoker    Quit date:  01/04/2011  . Smokeless tobacco: Never Used     Comment: quit 2009  . Alcohol Use: Yes     Comment: occasonal    OB History    Gravida Para Term Preterm AB TAB SAB Ectopic Multiple Living   0              Review of Systems  All other systems reviewed and are negative.     Allergies  Review of patient's allergies indicates no known allergies.  Home Medications   Prior to Admission medications   Medication Sig Start Date End Date Taking? Authorizing Provider  albuterol (PROVENTIL HFA;VENTOLIN HFA) 108 (90 BASE) MCG/ACT inhaler Inhale 2 puffs into the lungs every 6 (six) hours as needed for wheezing or shortness of breath (asthma).    Yes Historical Provider, MD  cyclobenzaprine (FLEXERIL) 10 MG tablet TAKE ONE TABLET BY MOUTH EVERY 8 HOURS AS NEEDED FOR MUSCLE SPASMS 06/07/15  Yes Peggy Constant, MD  ibuprofen (ADVIL,MOTRIN) 600 MG tablet Take 1 tablet (600 mg total) by mouth every 6 (six) hours as needed. 02/09/15  Yes Lisa A Leftwich-Kirby, CNM  metFORMIN (GLUCOPHAGE) 500 MG tablet Take 1 tablet (500 mg total) by mouth 2 (two) times daily with a meal. 08/23/15  Yes Rhona Raider Stinson, DO  metroNIDAZOLE (FLAGYL) 500 MG tablet  Take 1 tablet (500 mg total) by mouth 2 (two) times daily. One po bid x 7 days 10/03/15   Mancel Bale, MD   BP 150/88 mmHg  Pulse 99  Temp(Src) 97.5 F (36.4 C) (Oral)  Resp 18  Ht  (1.651 m)  Wt 354 lb (160.573 kg)  BMI 58.91 kg/m2  SpO2 99%  LMP 04/11/2015 Physical Exam  Constitutional: She is oriented to person, place, and time. She appears well-developed.  Morbidly obese  HENT:  Head: Normocephalic and atraumatic.  Right Ear: External ear normal.  Left Ear: External ear normal.  Eyes: Conjunctivae and EOM are normal. Pupils are equal, round, and reactive to light.  Neck: Normal range of motion and phonation normal. Neck supple.  Cardiovascular: Normal rate, regular rhythm and normal heart sounds.   Pulmonary/Chest: Effort normal and breath  sounds normal. She exhibits no bony tenderness.  Abdominal: Soft. There is no tenderness.  Musculoskeletal: Normal range of motion.  Neurological: She is alert and oriented to person, place, and time. No cranial nerve deficit or sensory deficit. She exhibits normal muscle tone. Coordination normal.  Skin: Skin is warm, dry and intact.  Psychiatric: She has a normal mood and affect. Her behavior is normal. Judgment and thought content normal.  Nursing note and vitals reviewed.   ED Course  Procedures (including critical care time)  Medications  ibuprofen (ADVIL,MOTRIN) tablet 400 mg (400 mg Oral Given 10/03/15 1759)    Patient Vitals for the past 24 hrs:  BP Temp Temp src Pulse Resp SpO2 Height Weight  10/03/15 1511 150/88 mmHg 97.5 F (36.4 C) Oral 99 18 99 %  (1.651 m) (!) 354 lb (160.573 kg)    6:43 PM Reevaluation with update and discussion. After initial assessment and treatment, an updated evaluation reveals no change in clinical status. She is comfortable. Findings discussed with the patient and all questions were answered. Esteban Kobashigawa L    Labs Review Labs Reviewed  WET PREP, GENITAL - Abnormal; Notable for the following:    Clue Cells Wet Prep HPF POC PRESENT (*)    WBC, Wet Prep HPF POC PRESENT (*)    All other components within normal limits  URINALYSIS, ROUTINE W REFLEX MICROSCOPIC (NOT AT [facility]) - Abnormal; Notable for the following:    Ketones, ur TRACE (*)    All other components within normal limits  PREGNANCY, URINE  RPR  HIV ANTIBODY (ROUTINE TESTING)  GC/CHLAMYDIA PROBE AMP ([place]) NOT AT [facility]    Imaging Review US Transvaginal Non-ob  10/03/2015  CLINICAL DATA:  Right lower quadrant pain . EXAM: TRANSABDOMINAL AND TRANSVAGINAL ULTRASOUND OF PELVIS TECHNIQUE: Both transabdominal and transvaginal ultrasound examinations of the pelvis were performed. Transabdominal technique was performed for global imaging of the pelvis including uterus, ovaries,  adnexal regions, and pelvic cul-de-sac. It was necessary to proceed with endovaginal exam following the transabdominal exam to visualize the uterus and ovaries. COMPARISON:  12/12/2014. FINDINGS: Uterus Measurements: 7.0 x 5.1 x 6.6 cm. No fibroids or other mass visualized. Endometrium Thickness: 8.0 mm.  No focal abnormality visualized. Right ovary Measurements: 2.7 x 1.7 x 2.4 cm. Normal appearance/no adnexal mass. Left ovary Measurements: 2.9 x 1.8 x 1.7 cm. Normal appearance/no adnexal mass. Other findings Small amount of free pelvic fluid. IMPRESSION: 1. Small amount of free pelvic fluid. 2.  Exam otherwise unremarkable. Electronically Signed   By: Maisie Fus  Register   On: 10/03/2015 16:49   US Pelvis Complete  10/03/2015  CLINICAL DATA:  Right  lower quadrant pain . EXAM: TRANSABDOMINAL AND TRANSVAGINAL ULTRASOUND OF PELVIS TECHNIQUE: Both transabdominal and transvaginal ultrasound examinations of the pelvis were performed. Transabdominal technique was performed for global imaging of the pelvis including uterus, ovaries, adnexal regions, and pelvic cul-de-sac. It was necessary to proceed with endovaginal exam following the transabdominal exam to visualize the uterus and ovaries. COMPARISON:  12/12/2014. FINDINGS: Uterus Measurements: 7.0 x 5.1 x 6.6 cm. No fibroids or other mass visualized. Endometrium Thickness: 8.0 mm.  No focal abnormality visualized. Right ovary Measurements: 2.7 x 1.7 x 2.4 cm. Normal appearance/no adnexal mass. Left ovary Measurements: 2.9 x 1.8 x 1.7 cm. Normal appearance/no adnexal mass. Other findings Small amount of free pelvic fluid. IMPRESSION: 1. Small amount of free pelvic fluid. 2.  Exam otherwise unremarkable. Electronically Signed   By: Maisie Fushomas  Register   On: 10/03/2015 16:49   I have personally reviewed and evaluated these images and lab results as part of my medical decision-making.   EKG Interpretation None      MDM   Final diagnoses:  Pelvic pain in female   Nonspecific vaginitis    Nonspecific pelvic pain. No obvious complications on imaging or laboratory testing. Incidental vaginitis. Doubt PID or TOA.  Nursing Notes Reviewed/ Care Coordinated Applicable Imaging Reviewed Interpretation of Laboratory Data incorporated into ED treatment  The patient appears reasonably screened and/or stabilized for discharge and I doubt any other medical condition or other [facility] requiring further screening, evaluation, or treatment in the ED at this time prior to discharge.  Plan: Home Medications- Flagyl; Home Treatments- rest; return here if the recommended treatment, does not improve the symptoms; Recommended follow up- PCP 1 week and prn   Mancel BaleElliott Hadlie Gipson, MD 10/03/15 1845

## 2015-10-03 NOTE — ED Notes (Signed)
Pt unable to provide urine specimen at this time

## 2015-10-04 LAB — HIV ANTIBODY (ROUTINE TESTING W REFLEX): HIV SCREEN 4TH GENERATION: NONREACTIVE

## 2015-10-04 LAB — GC/CHLAMYDIA PROBE AMP (~~LOC~~) NOT AT ARMC
CHLAMYDIA, DNA PROBE: NEGATIVE
NEISSERIA GONORRHEA: NEGATIVE

## 2015-10-04 LAB — RPR: RPR: NONREACTIVE

## 2015-12-05 ENCOUNTER — Telehealth: Payer: Self-pay | Admitting: *Deleted

## 2015-12-05 NOTE — Telephone Encounter (Signed)
Pt left message stating that she has been having throbbing, twitching and burning sensation on the Lt side of her ovary for 2 days. She wants to know what she can do about this.

## 2015-12-06 NOTE — Telephone Encounter (Signed)
Called patient and she states she was having pain 2 days in a row off and on on the lower left side near her ovary. Told patient it's possible with her PCOS it could just be a cyst and recommended prescription strength ibuprofen for pain and to call us if pain becomes severe and constant. Patient verbalized understanding & had no questions

## 2016-01-08 ENCOUNTER — Encounter (HOSPITAL_COMMUNITY): Payer: Self-pay | Admitting: Emergency Medicine

## 2016-01-08 ENCOUNTER — Emergency Department (HOSPITAL_COMMUNITY): Payer: Self-pay

## 2016-01-08 ENCOUNTER — Emergency Department (HOSPITAL_COMMUNITY)
Admission: EM | Admit: 2016-01-08 | Discharge: 2016-01-08 | Disposition: A | Discharge status: Home / Self Care | Payer: Self-pay | Attending: Emergency Medicine | Admitting: Emergency Medicine

## 2016-01-08 DIAGNOSIS — R0789 Other chest pain: Secondary | ICD-10-CM | POA: Insufficient documentation

## 2016-01-08 DIAGNOSIS — I10 Essential (primary) hypertension: Secondary | ICD-10-CM | POA: Insufficient documentation

## 2016-01-08 DIAGNOSIS — Z8619 Personal history of other infectious and parasitic diseases: Secondary | ICD-10-CM | POA: Insufficient documentation

## 2016-01-08 DIAGNOSIS — M7989 Other specified soft tissue disorders: Secondary | ICD-10-CM | POA: Insufficient documentation

## 2016-01-08 DIAGNOSIS — J45909 Unspecified asthma, uncomplicated: Secondary | ICD-10-CM | POA: Insufficient documentation

## 2016-01-08 DIAGNOSIS — Z862 Personal history of diseases of the blood and blood-forming organs and certain disorders involving the immune mechanism: Secondary | ICD-10-CM | POA: Insufficient documentation

## 2016-01-08 DIAGNOSIS — Z87891 Personal history of nicotine dependence: Secondary | ICD-10-CM | POA: Insufficient documentation

## 2016-01-08 DIAGNOSIS — Z87440 Personal history of urinary (tract) infections: Secondary | ICD-10-CM | POA: Insufficient documentation

## 2016-01-08 DIAGNOSIS — Z8639 Personal history of other endocrine, nutritional and metabolic disease: Secondary | ICD-10-CM | POA: Insufficient documentation

## 2016-01-08 LAB — CBC
HEMATOCRIT: 45.1 % (ref 36.0–46.0)
Hemoglobin: 14.5 g/dL (ref 12.0–15.0)
MCH: 30.1 pg (ref 26.0–34.0)
MCHC: 32.2 g/dL (ref 30.0–36.0)
MCV: 93.8 fL (ref 78.0–100.0)
Platelets: 216 10*3/uL (ref 150–400)
RBC: 4.81 MIL/uL (ref 3.87–5.11)
RDW: 13.8 % (ref 11.5–15.5)
WBC: 9.3 10*3/uL (ref 4.0–10.5)

## 2016-01-08 LAB — BASIC METABOLIC PANEL
Anion gap: 9 (ref 5–15)
BUN: 11 mg/dL (ref 6–20)
CO2: 24 mmol/L (ref 22–32)
Calcium: 9 mg/dL (ref 8.9–10.3)
Chloride: 108 mmol/L (ref 101–111)
Creatinine, Ser: 0.9 mg/dL (ref 0.44–1.00)
GFR calc non Af Amer: >60 mL/min (ref >60)
Glucose, Bld: 76 mg/dL (ref 65–99)
POTASSIUM: 3.4 mmol/L — AB (ref 3.5–5.1)
SODIUM: 141 mmol/L (ref 135–145)

## 2016-01-08 LAB — I-STAT TROPONIN, ED: Troponin i, poc: 0 ng/mL (ref 0.00–0.08)

## 2016-01-08 LAB — BRAIN NATRIURETIC PEPTIDE: B NATRIURETIC PEPTIDE 5: 16.8 pg/mL (ref 0.0–100.0)

## 2016-01-08 LAB — D-DIMER, QUANTITATIVE (NOT AT ARMC): D DIMER QUANT: 0.37 ug{FEU}/mL (ref 0.00–0.50)

## 2016-01-08 NOTE — ED Notes (Signed)
Pt states her feet/lower legs began swelling yesterday. States she started having occasional stabbing chest pains that radiate into left shoulder and left neck. Pain increases with breathing. C/o dizziness, SOB, and nausea. Does not appear to be in any obvious distress in triage. Left leg appears to be more swollen than right. Lung sounds clear in triage.

## 2016-01-08 NOTE — Discharge Instructions (Signed)
Schedule a follow up appointment with a primary care provider. Return to ED with new, worsening or concerning symptoms.    Chest Pain Observation It is often hard to give a specific diagnosis for the cause of chest pain. Among other possibilities your symptoms might be caused by inadequate oxygen delivery to your heart (angina). Angina that is not treated or evaluated can lead to a heart attack (myocardial infarction) or death. Blood tests, electrocardiograms, and X-rays may have been done to help determine a possible cause of your chest pain. After evaluation and observation, your health care provider has determined that it is unlikely your pain was caused by an unstable condition that requires hospitalization. However, a full evaluation of your pain may need to be completed, with additional diagnostic testing as directed. It is very important to keep your follow-up appointments. Not keeping your follow-up appointments could result in permanent heart damage, disability, or death. If there is any problem keeping your follow-up appointments, you must call your health care provider. HOME CARE INSTRUCTIONS  Due to the slight chance that your pain could be angina, it is important to follow your health care provider's treatment plan and also maintain a healthy lifestyle:  Maintain or work toward achieving a healthy weight.  Stay physically active and exercise regularly.  Decrease your salt intake.  Eat a balanced, healthy diet. Talk to a dietitian to learn about heart-healthy foods.  Increase your fiber intake by including whole grains, vegetables, fruits, and nuts in your diet.  Avoid situations that cause stress, anger, or depression.  Take medicines as advised by your health care provider. Report any side effects to your health care provider. Do not stop medicines or adjust the dosages on your own.  Quit smoking. Do not use nicotine patches or gum until you check with your health care  provider.  Keep your blood pressure, blood sugar, and cholesterol levels within normal limits.  Limit alcohol intake to no more than 1 drink per day for women who are not pregnant and 2 drinks per day for men.  Do not abuse drugs. SEEK IMMEDIATE MEDICAL CARE IF: You have severe chest pain or pressure which may include symptoms such as:  You feel pain or pressure in your arms, neck, jaw, or back.  You have severe back or abdominal pain, feel sick to your stomach (nauseous), or throw up (vomit).  You are sweating profusely.  You are having a fast or irregular heartbeat.  You feel short of breath while at rest.  You notice increasing shortness of breath during rest, sleep, or with activity.  You have chest pain that does not get better after rest or after taking your usual medicine.  You wake from sleep with chest pain.  You are unable to sleep because you cannot breathe.  You develop a frequent cough or you are coughing up blood.  You feel dizzy, faint, or experience extreme fatigue.  You develop severe weakness, dizziness, fainting, or chills. Any of these symptoms may represent a serious problem that is an emergency. Do not wait to see if the symptoms will go away. Call your local emergency services (911 in the [place] Do not drive yourself to the hospital. MAKE SURE YOU:  Understand these instructions.  Will watch your condition.  Will get help right away if you are not doing well or get worse.   This information is not intended to replace advice given to you by your health care provider. Make sure you discuss any  questions you have with your health care provider.   Document Released: 11/01/2010 Document Revised: 10/04/2013 Document Reviewed: 03/31/2013 Elsevier Interactive Patient Education 2016 Else[employer] Inc[employer]  Edema Edema is an abnormal buildup of fluids in your bodytissues. Edema is somewhatdependent on gravity to pull the fluid to the lowest place in your body.  That makes the condition more common in the legs and thighs (lower extremities). Painless swelling of the feet and ankles is common and becomes more likely as you get older. It is also common in looser tissues, like around your eyes.  When the affected area is squeezed, the fluid may move out of that spot and leave a dent for a few moments. This dent is called pitting.  CAUSES  There are many possible causes of edema. Eating too much salt and being on your feet or sitting for a long time can cause edema in your legs and ankles. Hot weather may make edema worse. Common medical causes of edema include:  Heart failure.  Liver disease.  Kidney disease.  Weak blood vessels in your legs.  Cancer.  An injury.  Pregnancy.  Some medications.  Obesity. SYMPTOMS  Edema is usually painless.Your skin may look swollen or shiny.  DIAGNOSIS  Your health care provider may be able to diagnose edema by asking about your medical history and doing a physical exam. You may need to have tests such as X-rays, an electrocardiogram, or blood tests to check for medical conditions that may cause edema.  TREATMENT  Edema treatment depends on the cause. If you have heart, liver, or kidney disease, you need the treatment appropriate for these conditions. General treatment may include:  Elevation of the affected body part above the level of your heart.  Compression of the affected body part. Pressure from elastic bandages or support stockings squeezes the tissues and forces fluid back into the blood vessels. This keeps fluid from entering the tissues.  Restriction of fluid and salt intake.  Use of a water pill (diuretic). These medications are appropriate only for some types of edema. They pull fluid out of your body and make you urinate more often. This gets rid of fluid and reduces swelling, but diuretics can have side effects. Only use diuretics as directed by your health care provider. HOME CARE  INSTRUCTIONS   Keep the affected body part above the level of your heart when you are lying down.   Do not sit still or stand for prolonged periods.   Do not put anything directly under your knees when lying down.  Do not wear constricting clothing or garters on your upper legs.   Exercise your legs to work the fluid back into your blood vessels. This may help the swelling go down.   Wear elastic bandages or support stockings to reduce ankle swelling as directed by your health care provider.   Eat a low-salt diet to reduce fluid if your health care provider recommends it.   Only take medicines as directed by your health care provider. SEEK MEDICAL CARE IF:   Your edema is not responding to treatment.  You have heart, liver, or kidney disease and notice symptoms of edema.  You have edema in your legs that does not improve after elevating them.   You have sudden and unexplained weight gain. SEEK IMMEDIATE MEDICAL CARE IF:   You develop shortness of breath or chest pain.   You cannot breathe when you lie down.  You develop pain, redness, or warmth in the swollen  areas.   You have heart, liver, or kidney disease and suddenly get edema.  You have a fever and your symptoms suddenly get worse. MAKE SURE YOU:   Understand these instructions.  Will watch your condition.  Will get help right away if you are not doing well or get worse.   This information is not intended to replace advice given to you by your health care provider. Make sure you discuss any questions you have with your health care provider.   Document Released: 09/29/2005 Document Revised: 10/20/2014 Document Reviewed: 07/22/2013 Elsevier Interactive Patient Education [employer]

## 2016-01-08 NOTE — ED Provider Notes (Signed)
CSN: [id number]     Arrival date & time 01/08/16  1637 History   First MD Initiated Contact with Patient 01/08/16 2026     Chief Complaint  Patient presents with  . Chest Pain  . Leg Swelling   HPI  Lori Mcknight is a 30 year old female with PMHx of PCOS, asthma, obesity and HTN presenting with bilateral extremity swelling and chest pain. She reports intermittent chest pain over the past year. This chest pain is always left-sided and radiates into her left arm and neck. It is described as sharp and stabbing. The episodes last for a few minutes before resolving without intervention. This has been happening over the past year has become a daily occurrence over the past week. The chest pain is exacerbated by deep inspiration. She reports associated shortness of breath, nausea and dizziness with the episodes of chest pain. These symptoms are new. She is not currently experiencing the chest pain or these symptoms. She is also complaining of bilateral lower leg swelling. She states that this is also a chronic symptom that has been worsening over the past 2-3 days. She states swelling alternates between legs and appears to be more severe in her left leg today. She states that her legs feel tight when bending them. She states that ambulating causes pain in her legs. There are not painful at rest. Denies history of blood clots. She does not have a PCP. She reports family history of cardiac disease. She has no other complaints today.   Past Medical History  Diagnosis Date  . PCOS (polycystic ovarian syndrome)   . Asthma   . Obese   . Urinary tract infection   . Chlamydia   . Genital herpes   . Morbid obesity (HCC)   . Shortness of breath     on exertion from weight  . Hypertension     no meds since April  . Chest pain of uncertain etiology     intermittent left side chest pain  . Anemia     history of anemia   Past Surgical History  Procedure Laterality Date  . Wisdom tooth extraction    . No past  surgeries    . Hysteroscopy w/d&c N/A 04/06/2014    Procedure: DILATATION AND CURETTAGE /HYSTEROSCOPY ;  Surgeon: Tereso Newcomer, MD;  Location: [facility];  Service: Gynecology;  Laterality: N/A;  . Dilation and curettage of uterus     Family History  Problem Relation Age of Onset  . Heart disease Mother   . Diabetes Father   . Heart disease Father   . Heart disease Maternal Grandmother   . Diabetes Maternal Grandfather   . Heart disease Maternal Grandfather   . Sleep apnea Mother    Social History  Substance Use Topics  . Smoking status: Former Smoker    Quit date: 01/04/2011  . Smokeless tobacco: Never Used     Comment: quit 2009  . Alcohol Use: Yes     Comment: occasonal    OB History    Gravida Para Term Preterm AB TAB SAB Ectopic Multiple Living   0              Review of Systems  All other systems reviewed and are negative.     Allergies  Review of patient's allergies indicates no known allergies.  Home Medications   Prior to Admission medications   Medication Sig Start Date End Date Taking? Authorizing Provider  ibuprofen (ADVIL,MOTRIN) 200 MG tablet Take  600 mg by mouth every 6 (six) hours as needed for moderate pain.   Yes Historical Provider, MD  cyclobenzaprine (FLEXERIL) 10 MG tablet TAKE ONE TABLET BY MOUTH EVERY 8 HOURS AS NEEDED FOR MUSCLE SPASMS 06/07/15   Peggy Constant, MD  ibuprofen (ADVIL,MOTRIN) 600 MG tablet Take 1 tablet (600 mg total) by mouth every 6 (six) hours as needed. Patient not taking: Reported on 01/08/2016 02/09/15   Wilmer Floor Leftwich-Kirby, CNM  metFORMIN (GLUCOPHAGE) 500 MG tablet Take 1 tablet (500 mg total) by mouth 2 (two) times daily with a meal. Patient not taking: Reported on 01/08/2016 08/23/15   Rhona Raider Stinson, DO  metroNIDAZOLE (FLAGYL) 500 MG tablet Take 1 tablet (500 mg total) by mouth 2 (two) times daily. One po bid x 7 days Patient not taking: Reported on 01/08/2016 10/03/15   Mancel Bale, MD   BP 128/77 mmHg  Pulse 88   Temp(Src) 98.3 F (36.8 C) (Oral)  Resp 10  SpO2 97%  LMP 05/10/2015 Physical Exam  Constitutional: She appears well-developed and well-nourished. No distress.  Morbidly obese female in no acute distress  HENT:  Head: Normocephalic and atraumatic.  Mouth/Throat: Oropharynx is clear and moist.  Eyes: Conjunctivae are normal. Right eye exhibits no discharge. Left eye exhibits no discharge. No scleral icterus.  Neck: Normal range of motion. Neck supple.  Cardiovascular: Normal rate, regular rhythm and normal heart sounds.   Pulmonary/Chest: Effort normal and breath sounds normal. No respiratory distress. She has no wheezes. She has no rales.  Abdominal: Soft. She exhibits no distension. There is no tenderness.  Musculoskeletal: Normal range of motion.  Bilateral lower extremity pitting edema. No asymmetric swelling. No posterior calf tenderness, cords or erythema. Full range of motion of the bilateral lower extremities intact.  Neurological: She is alert. Coordination normal.  Skin: Skin is warm and dry.  Psychiatric: She has a normal mood and affect. Her behavior is normal.  Nursing note and vitals reviewed.   ED Course  Procedures (including critical care time) Labs Review Labs Reviewed  BASIC METABOLIC PANEL - Abnormal; Notable for the following:    Potassium 3.4 (*)    All other components within normal limits  CBC  BRAIN NATRIURETIC PEPTIDE  D-DIMER, QUANTITATIVE (NOT AT [facility])  Rosezena Sensor, ED    Imaging Review Dg Chest 2 View  01/08/2016  CLINICAL DATA:  Chest pain and shortness of breath EXAM: CHEST  2 VIEW COMPARISON:  06/03/2006 chest radiograph. FINDINGS: Stable cardiomediastinal silhouette with normal heart size. No pneumothorax. No pleural effusion. Lungs appear clear, with no acute consolidative airspace disease and no pulmonary edema. IMPRESSION: No active cardiopulmonary disease. Electronically Signed   By: Delbert Phenix M.D.   On: 01/08/2016 18:35   I have  personally reviewed and evaluated these images and lab results as part of my medical decision-making.   EKG Interpretation   Date/Time:  Tuesday January 08 2016 18:17:25 EDT Ventricular Rate:  82 PR Interval:  154 QRS Duration: 81 QT Interval:  343 QTC Calculation: 400 R Axis:   69 Text Interpretation:  Sinus rhythm Consider left atrial enlargement  Baseline wander in lead(s) V3 Confirmed by PICKERING  MD, Harrold Donath [phone])  on 01/08/2016 10:51:56 PM      MDM   Final diagnoses:  Atypical chest pain  Leg swelling   Patient presenting with atypical chest pain and lower extremity edema x 1 year. Patient is to be discharged with recommendation to follow up with PCP in regards to today's  hospital visit. VSS. Heart RRR without murmur. Lungs CTAB.  Bilateral, symmetric peripheral edema. No posterior calf tenderness, cords or erythema. CBC and BMET unremarkable. Troponin 0 with non-iscehmic ECG. D dimer negative. BNP 17. CXR without abnormality. Presentation and workup is not consistent with cardiac or pulmonary pain origin. Discussed importance of outpatient follow up for this. Given referral for [facility]. Pt has been advised to return to the ED if CP becomes exertional, associated with diaphoresis or nausea, radiates to left jaw/arm, worsens or becomes concerning in any way. Pt appears reliable for follow up and is agreeable to discharge.   Case has been discussed with and seen by Dr. Rubin PayorPickering who agrees with the above plan to discharge.      Rolm GalaStevi Rennae Ferraiolo, PA-C 01/08/16 2303  Benjiman CoreNathan Pickering, MD 01/09/16 [phone]

## 2016-01-15 ENCOUNTER — Ambulatory Visit: Payer: Self-pay | Attending: Internal Medicine | Admitting: Internal Medicine

## 2016-01-15 ENCOUNTER — Encounter: Payer: Self-pay | Admitting: Internal Medicine

## 2016-01-15 VITALS — BP 140/97 | HR 101 | Temp 98.0°F | Resp 16 | Ht 66.0 in | Wt 362.8 lb

## 2016-01-15 DIAGNOSIS — E282 Polycystic ovarian syndrome: Secondary | ICD-10-CM | POA: Insufficient documentation

## 2016-01-15 DIAGNOSIS — Z79899 Other long term (current) drug therapy: Secondary | ICD-10-CM | POA: Insufficient documentation

## 2016-01-15 DIAGNOSIS — Z87891 Personal history of nicotine dependence: Secondary | ICD-10-CM | POA: Insufficient documentation

## 2016-01-15 DIAGNOSIS — I10 Essential (primary) hypertension: Secondary | ICD-10-CM | POA: Insufficient documentation

## 2016-01-15 DIAGNOSIS — R079 Chest pain, unspecified: Secondary | ICD-10-CM | POA: Insufficient documentation

## 2016-01-15 DIAGNOSIS — Z6841 Body Mass Index (BMI) 40.0 and over, adult: Secondary | ICD-10-CM | POA: Insufficient documentation

## 2016-01-15 DIAGNOSIS — J4520 Mild intermittent asthma, uncomplicated: Secondary | ICD-10-CM | POA: Insufficient documentation

## 2016-01-15 MED ORDER — HYDROCHLOROTHIAZIDE 25 MG PO TABS: 25.0000 mg | ORAL_TABLET | Freq: Every day | ORAL | Status: DC

## 2016-01-15 MED ORDER — OMEPRAZOLE 40 MG PO CPDR: 40.0000 mg | DELAYED_RELEASE_CAPSULE | Freq: Every day | ORAL | Status: DC

## 2016-01-15 MED ORDER — METOPROLOL TARTRATE 25 MG PO TABS: 25.0000 mg | ORAL_TABLET | Freq: Two times a day (BID) | ORAL | Status: DC

## 2016-01-15 MED ORDER — FLUTICASONE PROPIONATE HFA 110 MCG/ACT IN AERO: 2.0000 | INHALATION_SPRAY | Freq: Two times a day (BID) | RESPIRATORY_TRACT | Status: DC

## 2016-01-15 MED ORDER — ALBUTEROL SULFATE HFA 108 (90 BASE) MCG/ACT IN AERS: 2.0000 | INHALATION_SPRAY | Freq: Four times a day (QID) | RESPIRATORY_TRACT | Status: DC | PRN

## 2016-01-15 MED FILL — OMEPRAZOLE DR 40 MG CAPSULE: 40 | 30 days supply | Qty: 30 | Fill #0

## 2016-01-15 MED FILL — !VENTOLIN HFA INHALER: 108 (90 BAS | 30 days supply | Qty: 18 | Fill #0

## 2016-01-15 MED FILL — ?METOPROLOL 25 MG TABLET: 25 | 30 days supply | Qty: 60 | Fill #0

## 2016-01-15 MED FILL — HYDROCHLOROTHIAZIDE 25 MG T: 25 | 30 days supply | Qty: 30 | Fill #0

## 2016-01-15 MED FILL — !FLOVENT HFA 110 MCG INHALE: 110 | 30 days supply | Qty: 12 | Fill #0

## 2016-01-15 NOTE — Progress Notes (Signed)
Lori Mcknight, is a 30 y.o. female  WUJ:[id number]  NFA:[id number]  DOB - August 01, 1986  CC:  Chief Complaint  Patient presents with  . Follow-up       HPI: Lori Sandefur is a 30 y.o. female here today to establish medical care.  Pt has never been to our clinic. She co of intermittant sharp, stabbing pain, MEG region, sometimes radiates to her back.  Can happen at any time, going on for last 1 year or so, nonexertional.  Does not notice it associated w/ eating.  Pt was recently seen in ED 01/08/16 for Cp, ekg/cardiac enzymes unremarkable at that time.  Pt doesn't currently exercise, but is slowly working back to exercise w/ her mom.  Pt's mom is in room currently.  Patient has No headache, No chest pain, No abdominal pain - No Nausea, No new weakness tingling or numbness, No Cough - SOB.  Gets "flutterring sensation" in her chest sometimes.  Has been using her Albuterol inhaler more often now, especially now w/ all the pollen.  Denies wheezing.  No Known Allergies Past Medical History  Diagnosis Date  . PCOS (polycystic ovarian syndrome)   . Asthma   . Obese   . Urinary tract infection   . Chlamydia   . Genital herpes   . Morbid obesity (HCC)   . Shortness of breath     on exertion from weight  . Hypertension     no meds since April  . Chest pain of uncertain etiology     intermittent left side chest pain  . Anemia     history of anemia   Current Outpatient Prescriptions on File Prior to Visit  Medication Sig Dispense Refill  . cyclobenzaprine (FLEXERIL) 10 MG tablet TAKE ONE TABLET BY MOUTH EVERY 8 HOURS AS NEEDED FOR MUSCLE SPASMS 30 tablet 0  . ibuprofen (ADVIL,MOTRIN) 200 MG tablet Take 600 mg by mouth every 6 (six) hours as needed for moderate pain.    Marland Kitchen ibuprofen (ADVIL,MOTRIN) 600 MG tablet Take 1 tablet (600 mg total) by mouth every 6 (six) hours as needed. (Patient not taking: Reported on 01/08/2016) 30 tablet 1  . metFORMIN (GLUCOPHAGE) 500 MG tablet Take 1 tablet (500 mg  total) by mouth 2 (two) times daily with a meal. (Patient not taking: Reported on 01/08/2016) 60 tablet 6  . metroNIDAZOLE (FLAGYL) 500 MG tablet Take 1 tablet (500 mg total) by mouth 2 (two) times daily. One po bid x 7 days (Patient not taking: Reported on 01/08/2016) 14 tablet 0   No current facility-administered medications on file prior to visit.   Family History  Problem Relation Age of Onset  . Heart disease Mother   . Diabetes Father   . Heart disease Father   . Heart disease Maternal Grandmother   . Diabetes Maternal Grandfather   . Heart disease Maternal Grandfather   . Sleep apnea Mother    Social History   Social History  . Marital Status: Single    Spouse Name: N/A  . Number of Children: N/A  . Years of Education: N/A   Occupational History  . Not on file.   Social History Main Topics  . Smoking status: Former Smoker    Quit date: 01/04/2011  . Smokeless tobacco: Never Used     Comment: quit 2009  . Alcohol Use: Yes     Comment: occasonal   . Drug Use: No  . Sexual Activity: Yes    Birth Control/ Protection: None  Other Topics Concern  . Not on file   Social History Narrative    Review of Systems: Constitutional: Negative for fever, chills, diaphoresis, activity change, appetite change and fatigue. HENT: Negative for ear pain, nosebleeds, congestion, facial swelling, rhinorrhea, neck pain, neck stiffness and ear discharge.  Eyes: Negative for pain, discharge, redness, itching and visual disturbance. Respiratory: Negative for cough, choking, chest tightness,  wheezing and stridor.   +shortness of breath at times; Cardiovascular:  + chest pain, palpitations/ "fluttering" x 1 year, more frequent now;  Gastrointestinal: Negative for abdominal distention. Genitourinary: Negative for dysuria, urgency, frequency, hematuria, flank pain, decreased urine volume, difficulty urinating and dyspareunia.   Musculoskeletal: Negative for back pain, joint swelling,  arthralgia and gait problem. Neurological: Negative for dizziness, tremors, seizures, syncope, facial asymmetry, speech difficulty, weakness, light-headedness, numbness and headaches.  Hematological: Negative for adenopathy. Does not bruise/bleed easily. Psychiatric/Behavioral: Negative for hallucinations, behavioral problems, confusion, dysphoric mood, decreased concentration and agitation.    Objective:   Filed Vitals:   01/15/16 1409  BP: 140/97  Pulse: 101  Temp: 98 F (36.7 C)  Resp: 16    Physical Exam: Constitutional: Patient appears well-developed and well-nourished. No distress. AAOx3, morbid obese, bmi 58. HENT: Normocephalic, atraumatic, External right and left ear normal. Oropharynx is clear and moist.  Eyes: Conjunctivae and EOM are normal. PERRL, no scleral icterus. Neck: Normal ROM. Neck supple. No JVD. No tracheal deviation. No thyromegaly. CVS: RRR, S1/S2 +, tachycardic; no murmurs, no gallops, no carotid bruit.  +pain reproducible to MEG., trace pedal edema bilat. Pulmonary: Effort and breath sounds normal, no stridor, rhonchi, wheezes, rales.  Abdominal: Soft. Obese, BS +, no distension, tenderness, rebound or guarding.  Musculoskeletal: Normal range of motion. No edema and no tenderness.  Lymphadenopathy: No lymphadenopathy noted, cervical, Neuro: Alert. Normal reflexes, muscle tone coordination. No cranial nerve deficit grossly. Skin: Skin is warm and dry. No rash noted. Not diaphoretic. No erythema. No pallor. Psychiatric: Normal mood and affect. Behavior, judgment, thought content normal.  Lab Results  Component Value Date   WBC 9.3 01/08/2016   HGB 14.5 01/08/2016   HCT 45.1 01/08/2016   MCV 93.8 01/08/2016   PLT 216 01/08/2016   Lab Results  Component Value Date   CREATININE 0.90 01/08/2016   BUN 11 01/08/2016   NA 141 01/08/2016   K 3.4* 01/08/2016   CL 108 01/08/2016   CO2 24 01/08/2016    No results found for: HGBA1C Lipid Panel  No  results found for: CHOL, TRIG, HDL, CHOLHDL, VLDL, LDLCALC     Assessment and plan:   1. Essential hypertension DASH diet, exercise program advised - trial metoprolol 25bid - trial hztc 25qd (le edema)  2. PCOS (polycystic ovarian syndrome) - currently not taking metformin, couldn't tolerated the gi sxs, advised should consider restarting it again b/c could also help w. weightloss  3. Obesity, Class III, BMI 40-49.9 (morbid obesity) (HCC) - diet/exercise regimen discussed - TSH ro hypothryoidism today  4. Asthma, mild intermittent, uncomplicated - has been having to use her albuterol inhaler more regular, no w/c/r today on exam, good insp/expr. - flovent ordered 2puffs bid - albuterol mdi prn   5. Chest pain, unspecified chest pain type, multifactorial, including work of breathing from obesity, r/o gerd - trial ppi / protonix - Ambulatory referral to Cardiology for stresstest  6. Financial services  Return in about 3 months (around 04/15/2016).  The patient was given clear instructions to go to ER or return to medical  center if symptoms don't improve, worsen or new problems develop. The patient verbalized understanding. The patient was told to call to get lab results if they haven't heard anything in the next week.      Pete Glatter, MD, MBA/MHA [facility] [place], [place] [phone]   01/15/2016, 2:46 PM

## 2016-01-15 NOTE — Patient Instructions (Signed)
Financial packet Referral for cardiology stresstest  Exercising to Lose Weight Exercising can help you to lose weight. In order to lose weight through exercise, you need to do vigorous-intensity exercise. You can tell that you are exercising with vigorous intensity if you are breathing very hard and fast and cannot hold a conversation while exercising. Moderate-intensity exercise helps to maintain your current weight. You can tell that you are exercising at a moderate level if you have a higher heart rate and faster breathing, but you are still able to hold a conversation. HOW OFTEN SHOULD I EXERCISE? Choose an activity that you enjoy and set realistic goals. Your health care provider can help you to make an activity plan that works for you. Exercise regularly as directed by your health care provider. This may include:  Doing resistance training twice each week, such as:  Push-ups.  Sit-ups.  Lifting weights.  Using resistance bands.  Doing a given intensity of exercise for a given amount of time. Choose from these options:  150 minutes of moderate-intensity exercise every week.  75 minutes of vigorous-intensity exercise every week.  A mix of moderate-intensity and vigorous-intensity exercise every week. Children, pregnant women, people who are out of shape, people who are overweight, and older adults may need to consult a health care provider for individual recommendations. If you have any sort of medical condition, be sure to consult your health care provider before starting a new exercise program. WHAT ARE SOME ACTIVITIES THAT CAN HELP ME TO LOSE WEIGHT?   Walking at a rate of at least 4.5 miles an hour.  Jogging or running at a rate of 5 miles per hour.  Biking at a rate of at least 10 miles per hour.  Lap swimming.  Roller-skating or in-line skating.  Cross-country skiing.  Vigorous competitive sports, such as football, basketball, and soccer.  Jumping rope.  Aerobic  dancing. HOW CAN I BE MORE ACTIVE IN MY DAY-TO-DAY ACTIVITIES?  Use the stairs instead of the elevator.  Take a walk during your lunch break.  If you drive, park your car farther away from work or school.  If you take public transportation, get off one stop early and walk the rest of the way.  Make all of your phone calls while standing up and walking around.  Get up, stretch, and walk around every 30 minutes throughout the day. WHAT GUIDELINES SHOULD I FOLLOW WHILE EXERCISING?  Do not exercise so much that you hurt yourself, feel dizzy, or get very short of breath.  Consult your health care provider prior to starting a new exercise program.  Wear comfortable clothes and shoes with good support.  Drink plenty of water while you exercise to prevent dehydration or heat stroke. Body water is lost during exercise and must be replaced.  Work out until you breathe faster and your heart beats faster.   This information is not intended to replace advice given to you by your health care provider. Make sure you discuss any questions you have with your health care provider.   Document Released: 11/01/2010 Document Revised: 10/20/2014 Document Reviewed: 03/02/2014 Elsevier Interactive Patient Education 2016 [employer]  Hypertension Hypertension is another name for high blood pressure. High blood pressure forces your heart to work harder to pump blood. A blood pressure reading has two numbers, which includes a higher number over a lower number (example: 110/72). HOME CARE   Have your blood pressure rechecked by your doctor.  Only take medicine as told by your  doctor. Follow the directions carefully. The medicine does not work as well if you skip doses. Skipping doses also puts you at risk for problems.  Do not smoke.  Monitor your blood pressure at home as told by your doctor. GET HELP IF:  You think you are having a reaction to the medicine you are taking.  You have repeat  headaches or feel dizzy.  You have puffiness (swelling) in your ankles.  You have trouble with your vision. GET HELP RIGHT AWAY IF:   You get a very bad headache and are confused.  You feel weak, numb, or faint.  You get chest or belly (abdominal) pain.  You throw up (vomit).  You cannot breathe very well. MAKE SURE YOU:   Understand these instructions.  Will watch your condition.  Will get help right away if you are not doing well or get worse.   This information is not intended to replace advice given to you by your health care provider. Make sure you discuss any questions you have with your health care provider.   Document Released: 03/17/2008 Document Revised: 10/04/2013 Document Reviewed: 07/22/2013 Elsevier Interactive Patient Education 2016 Elsevier Inc. DASH Eating Plan DASH stands for "Dietary Approaches to Stop Hypertension." The DASH eating plan is a healthy eating plan that has been shown to reduce high blood pressure (hypertension). Additional health benefits may include reducing the risk of type 2 diabetes mellitus, heart disease, and stroke. The DASH eating plan may also help with weight loss. WHAT DO I NEED TO KNOW ABOUT THE DASH EATING PLAN? For the DASH eating plan, you will follow these general guidelines:  Choose foods with a percent daily value for sodium of less than 5% (as listed on the food label).  Use salt-free seasonings or herbs instead of table salt or sea salt.  Check with your health care provider or pharmacist before using salt substitutes.  Eat lower-sodium products, often labeled as "lower sodium" or "no salt added."  Eat fresh foods.  Eat more vegetables, fruits, and low-fat dairy products.  Choose whole grains. Look for the word "whole" as the first word in the ingredient list.  Choose fish and skinless chicken or [place] more often than red meat. Limit fish, poultry, and meat to 6 oz (170 g) each day.  Limit sweets, desserts,  sugars, and sugary drinks.  Choose heart-healthy fats.  Limit cheese to 1 oz (28 g) per day.  Eat more home-cooked food and less restaurant, buffet, and fast food.  Limit fried foods.  Cook foods using methods other than frying.  Limit canned vegetables. If you do use them, rinse them well to decrease the sodium.  When eating at a restaurant, ask that your food be prepared with less salt, or no salt if possible. WHAT FOODS CAN I EAT? Seek help from a dietitian for individual calorie needs. Grains Whole grain or whole wheat bread. Brown rice. Whole grain or whole wheat pasta. Quinoa, bulgur, and whole grain cereals. Low-sodium cereals. Corn or whole wheat flour tortillas. Whole grain cornbread. Whole grain crackers. Low-sodium crackers. Vegetables Fresh or frozen vegetables (raw, steamed, roasted, or grilled). Low-sodium or reduced-sodium tomato and vegetable juices. Low-sodium or reduced-sodium tomato sauce and paste. Low-sodium or reduced-sodium canned vegetables.  Fruits All fresh, canned (in natural juice), or frozen fruits. Meat and Other Protein Products Ground beef (85% or leaner), grass-fed beef, or beef trimmed of fat. Skinless chicken or [place]. Ground chicken or [place]. Pork trimmed of fat. All fish and  seafood. Eggs. Dried beans, peas, or lentils. Unsalted nuts and seeds. Unsalted canned beans. Dairy Low-fat dairy products, such as skim or 1% milk, 2% or reduced-fat cheeses, low-fat ricotta or cottage cheese, or plain low-fat yogurt. Low-sodium or reduced-sodium cheeses. Fats and Oils Tub margarines without trans fats. Light or reduced-fat mayonnaise and salad dressings (reduced sodium). Avocado. Safflower, olive, or canola oils. Natural peanut or almond butter. Other Unsalted popcorn and pretzels. The items listed above may not be a complete list of recommended foods or beverages. Contact your dietitian for more options. WHAT FOODS ARE NOT RECOMMENDED? Grains White  bread. White pasta. White rice. Refined cornbread. Bagels and croissants. Crackers that contain trans fat. Vegetables Creamed or fried vegetables. Vegetables in a cheese sauce. Regular canned vegetables. Regular canned tomato sauce and paste. Regular tomato and vegetable juices. Fruits Dried fruits. Canned fruit in light or heavy syrup. Fruit juice. Meat and Other Protein Products Fatty cuts of meat. Ribs, chicken wings, bacon, sausage, bologna, salami, chitterlings, fatback, hot dogs, bratwurst, and packaged luncheon meats. Salted nuts and seeds. Canned beans with salt. Dairy Whole or 2% milk, cream, half-and-half, and cream cheese. Whole-fat or sweetened yogurt. Full-fat cheeses or blue cheese. Nondairy creamers and whipped toppings. Processed cheese, cheese spreads, or cheese curds. Condiments Onion and garlic salt, seasoned salt, table salt, and sea salt. Canned and packaged gravies. Worcestershire sauce. Tartar sauce. Barbecue sauce. Teriyaki sauce. Soy sauce, including reduced sodium. Steak sauce. Fish sauce. Oyster sauce. Cocktail sauce. Horseradish. Ketchup and mustard. Meat flavorings and tenderizers. Bouillon cubes. Hot sauce. Tabasco sauce. Marinades. Taco seasonings. Relishes. Fats and Oils Butter, stick margarine, lard, shortening, ghee, and bacon fat. Coconut, palm kernel, or palm oils. Regular salad dressings. Other Pickles and olives. Salted popcorn and pretzels. The items listed above may not be a complete list of foods and beverages to avoid. Contact your dietitian for more information. WHERE CAN I FIND MORE INFORMATION? National Heart, Lung, and Blood Institute: [employer]   This information is not intended to replace advice given to you by your health care provider. Make sure you discuss any questions you have with your health care provider.   Document Released: 09/18/2011 Document Revised: 10/20/2014 Document Reviewed:  08/03/2013 Elsevier Interactive Patient Education [employer]

## 2016-01-15 NOTE — Progress Notes (Signed)
Patient here for follow up from the ED Was seen for chest pain All tests were negative for heart attack Patient was told it is most likely angina

## 2016-01-16 ENCOUNTER — Encounter: Payer: Self-pay | Admitting: Clinical

## 2016-01-16 ENCOUNTER — Other Ambulatory Visit: Payer: Self-pay | Admitting: Internal Medicine

## 2016-01-16 ENCOUNTER — Ambulatory Visit: Payer: Self-pay | Attending: Internal Medicine

## 2016-01-16 LAB — TSH: TSH: 6.24 mIU/L — ABNORMAL HIGH

## 2016-01-16 MED ORDER — LEVOTHYROXINE SODIUM 50 MCG PO TABS: 50.0000 ug | ORAL_TABLET | Freq: Every day | ORAL | Status: DC

## 2016-01-16 NOTE — Progress Notes (Signed)
Depression screen PHQ 2/9 01/15/2016  Decreased Interest 1  Down, Depressed, Hopeless 0  PHQ - 2 Score 1    GAD 7 : Generalized Anxiety Score 01/15/2016  Nervous, Anxious, on Edge 1  Control/stop worrying 1  Worry too much - different things 1  Trouble relaxing 0  Restless 0  Easily annoyed or irritable 0  Afraid - awful might happen 0  Total GAD 7 Score 3

## 2016-01-30 MED FILL — LEVOTHYROXINE 50 MCG TABLET: 50 | 30 days supply | Qty: 30 | Fill #0

## 2016-01-31 ENCOUNTER — Ambulatory Visit: Payer: Self-pay | Admitting: Cardiovascular Disease

## 2016-01-31 ENCOUNTER — Ambulatory Visit: Payer: Self-pay | Admitting: Obstetrics & Gynecology

## 2016-02-04 ENCOUNTER — Encounter: Payer: Self-pay | Admitting: Cardiovascular Disease

## 2016-02-04 ENCOUNTER — Ambulatory Visit (INDEPENDENT_AMBULATORY_CARE_PROVIDER_SITE_OTHER): Payer: Self-pay | Admitting: Cardiovascular Disease

## 2016-02-04 VITALS — BP 138/88 | HR 86 | Ht 66.0 in | Wt 356.8 lb

## 2016-02-04 DIAGNOSIS — R079 Chest pain, unspecified: Secondary | ICD-10-CM | POA: Insufficient documentation

## 2016-02-04 DIAGNOSIS — R0789 Other chest pain: Secondary | ICD-10-CM

## 2016-02-04 NOTE — Patient Instructions (Signed)

## 2016-02-04 NOTE — Progress Notes (Signed)
Cardiology Office Note   Date:  02/04/2016   ID:  Lori C Pizzuto, DOB 05-30-1986, MRN [id number]  PCP:  Pete Glatterawn T Langeland, MD  Cardiologist:   Vesta MixerNahser, Philip J, MD   Chief Complaint  Patient presents with  . Chest Pain   Problem List 1. Morbid obesity 2. Chest pain  3. Essential HTN 4. PCOS 5. Asthma  6. Hypothyroidism    History of Present Illness: Lori Mcknight is a 30 y.o. female who presents for chest pain  Was seen with her mother - Luther HearingMarilyn Teems.   Has had chest pains for the past year. Some radiation into her left neck and back Occurs several times a day . Last for 5-7 minutes.  Occurs at rest and with movement  Pressure like sensation,  Difficult to breath   Went to the ER on January 08, 2016:   Work up was negative at that time  Has been walking some - 1/2 mile several times a week, No pain while walking to the park .  Does not work  Still smokes - has asthma  Her diet is very poor - eats liver pudding , eggs, grits, every day for breakfast.    Past Medical History  Diagnosis Date  . PCOS (polycystic ovarian syndrome)   . Asthma   . Obese   . Urinary tract infection   . Chlamydia   . Genital herpes   . Morbid obesity (HCC)   . Shortness of breath     on exertion from weight  . Hypertension     no meds since April  . Chest pain of uncertain etiology     intermittent left side chest pain  . Anemia     history of anemia    Past Surgical History  Procedure Laterality Date  . Wisdom tooth extraction    . No past surgeries    . Hysteroscopy w/d&c N/A 04/06/2014    Procedure: DILATATION AND CURETTAGE /HYSTEROSCOPY ;  Surgeon: Tereso NewcomerUgonna A Anyanwu, MD;  Location: [facility];  Service: Gynecology;  Laterality: N/A;  . Dilation and curettage of uterus       Current Outpatient Prescriptions  Medication Sig Dispense Refill  . albuterol (PROVENTIL HFA;VENTOLIN HFA) 108 (90 Base) MCG/ACT inhaler Inhale 2 puffs into the lungs every 6 (six) hours as needed for  wheezing or shortness of breath. 1 Inhaler 2  . fluticasone (FLOVENT HFA) 110 MCG/ACT inhaler Inhale 2 puffs into the lungs 2 (two) times daily. 1 Inhaler 12  . hydrochlorothiazide (HYDRODIURIL) 25 MG tablet Take 1 tablet (25 mg total) by mouth daily. 90 tablet 3  . ibuprofen (ADVIL,MOTRIN) 200 MG tablet Take 600 mg by mouth every 6 (six) hours as needed for moderate pain.    Marland Kitchen. ibuprofen (ADVIL,MOTRIN) 600 MG tablet Take 1 tablet (600 mg total) by mouth every 6 (six) hours as needed. 30 tablet 1  . levothyroxine (SYNTHROID, LEVOTHROID) 50 MCG tablet Take 1 tablet (50 mcg total) by mouth daily. 90 tablet 3  . metFORMIN (GLUCOPHAGE) 500 MG tablet Take 1 tablet (500 mg total) by mouth 2 (two) times daily with a meal. 60 tablet 6  . metoprolol tartrate (LOPRESSOR) 25 MG tablet Take 1 tablet (25 mg total) by mouth 2 (two) times daily. 180 tablet 3  . omeprazole (PRILOSEC) 40 MG capsule Take 1 capsule (40 mg total) by mouth daily. 30 capsule 3   No current facility-administered medications for this visit.    Allergies:   Review  of patient's allergies indicates no known allergies.    Social History:  The patient  reports that she has been smoking.  She has never used smokeless tobacco. She reports that she drinks alcohol. She reports that she does not use illicit drugs.   Family History:  The patient's family history includes Diabetes in her father and maternal grandfather; Heart disease in her father, maternal grandfather, maternal grandmother, and mother; Sleep apnea in her mother.    ROS:  Please see the history of present illness.    Review of Systems: Constitutional:  denies fever, chills, diaphoresis, appetite change and fatigue.  HEENT: denies photophobia, eye pain, redness, hearing loss, ear pain, congestion, sore throat, rhinorrhea, sneezing, neck pain, neck stiffness and tinnitus.  Respiratory: admits to SOB,    Cardiovascular: admits to chest pain, denies  palpitations and leg  swelling.  Gastrointestinal: denies nausea, vomiting, abdominal pain, diarrhea, constipation, blood in stool.  Genitourinary: denies dysuria, urgency, frequency, hematuria, flank pain and difficulty urinating.  Musculoskeletal: denies  myalgias, back pain, joint swelling, arthralgias and gait problem.   Skin: denies pallor, rash and wound.  Neurological: denies dizziness, seizures, syncope, weakness, light-headedness, numbness and headaches.   Hematological: denies adenopathy, easy bruising, personal or family bleeding history.  Psychiatric/ Behavioral: denies suicidal ideation, mood changes, confusion, nervousness, sleep disturbance and agitation.       All other systems are reviewed and negative.    PHYSICAL EXAM: VS:  BP 138/88 mmHg  Pulse 86  Ht  (1.676 m)  Wt 356 lb 12.8 oz (161.843 kg)  BMI 57.62 kg/m2  SpO2 99%  LMP 05/10/2015 , BMI Body mass index is 57.62 kg/(m^2). GEN:  Morbidly obese female  HEENT: normal Neck: no JVD, carotid bruits, or masses Cardiac: RRR; no murmurs, rubs, or gallops,no edema  Respiratory:  clear to auscultation bilaterally, normal work of breathing GI: soft, nontender, nondistended, + BS MS: no deformity or atrophy Skin: warm and dry, no rash Neuro:  Strength and sensation are intact Psych: normal   EKG:  EKG is not ordered today. The ekg ordered January 08, 2016  demonstrates NSR at 47.   NSR   Recent Labs: 01/08/2016: B Natriuretic Peptide 16.8; BUN 11; Creatinine, Ser 0.90; Hemoglobin 14.5; Platelets 216; Potassium 3.4*; Sodium 141 01/15/2016: TSH 6.24*    Lipid Panel No results found for: CHOL, TRIG, HDL, CHOLHDL, VLDL, LDLCALC, LDLDIRECT    Wt Readings from Last 3 Encounters:  02/04/16 356 lb 12.8 oz (161.843 kg)  01/15/16 362 lb 12.8 oz (164.565 kg)  10/03/15 354 lb (160.573 kg)      Other studies Reviewed: Additional studies/ records that were reviewed today include: . Review of the above records demonstrates:     ASSESSMENT AND PLAN:  1.  Atypical chest pain. Her CP is very atypical - occurs several times a day  - typically at rest.  Not related to exertion .  I told her that her main medical issues was her morbid obesity .    I think that this presents more of a risk than her atypical CP. She is morbidly obese and I do not think that an echo or myoview would give [place] any useful information due to her body habitus. She would not be able to walk on a treadmill . I've exlained that she is at far greater risk with her obesity and the complications that are associated with that compared to her atypical CP.  2. Asthma - advised smoking cessation.  She did not think that she would be able to stop smoking .  3. Morbid obesity:   I think this is her most important issue .  Advised her to see a nutritionist and to start a walking program    Current medicines are reviewed at length with the patient today.  The patient does not have concerns regarding medicines.  The following changes have been made:  no change  Labs/ tests ordered today include:  No orders of the defined types were placed in this encounter.     Disposition:   FU with her primary MD     Nahser, Deloris Ping, MD  02/04/2016 10:11 AM    [facility] [address] [place], [place], [place]  [address] Phone: [phone]; Fax: [phone]   [facility]  [address] [place], [place]  [address] [phone]   Fax [phone]

## 2016-02-29 ENCOUNTER — Ambulatory Visit: Payer: Self-pay | Admitting: Obstetrics & Gynecology

## 2016-03-17 MED FILL — ?METOPROLOL 25 MG TABLET: 25 | 30 days supply | Qty: 60 | Fill #1

## 2016-03-17 MED FILL — ?HYDROCHLOROTHIAZIDE 25 MG: 25 MG | 30 days supply | Qty: 30 | Fill #1

## 2016-03-17 MED FILL — ?LEVOTHYROXINE 50 MCG TABLE: 50 | 30 days supply | Qty: 30 | Fill #1

## 2016-04-09 ENCOUNTER — Encounter (HOSPITAL_COMMUNITY): Payer: Self-pay

## 2016-04-09 ENCOUNTER — Ambulatory Visit (HOSPITAL_COMMUNITY)
Admission: EM | Admit: 2016-04-09 | Discharge: 2016-04-09 | Disposition: A | Discharge status: Home / Self Care | Payer: Self-pay | Attending: Family Medicine | Admitting: Family Medicine

## 2016-04-09 DIAGNOSIS — M130 Polyarthritis, unspecified: Secondary | ICD-10-CM

## 2016-04-09 MED ORDER — DICLOFENAC POTASSIUM 50 MG PO TABS: 50.0000 mg | ORAL_TABLET | Freq: Three times a day (TID) | ORAL | Status: DC

## 2016-04-09 NOTE — ED Provider Notes (Signed)
CSN: [id number]     Arrival date & time 04/09/16  1926 History   First MD Initiated Contact with Patient 04/09/16 1948     Chief Complaint  Patient presents with  . Ankle Pain   (Consider location/radiation/quality/duration/timing/severity/associated sxs/prior Treatment) Patient is a 30 y.o. female presenting with ankle pain. The history is provided by the patient and a parent.  Ankle Pain Location:  Ankle Injury: no   Ankle location:  L ankle Pain details:    Quality:  Burning   Severity:  Moderate   Onset quality:  Gradual   Duration:  3 days   Progression:  Worsening Chronicity:  New Dislocation: no   Prior injury to area:  No Associated symptoms: back pain, decreased ROM, numbness and stiffness   Risk factors: obesity     Past Medical History  Diagnosis Date  . PCOS (polycystic ovarian syndrome)   . Asthma   . Obese   . Urinary tract infection   . Chlamydia   . Genital herpes   . Morbid obesity (HCC)   . Shortness of breath     on exertion from weight  . Hypertension     no meds since April  . Chest pain of uncertain etiology     intermittent left side chest pain  . Anemia     history of anemia   Past Surgical History  Procedure Laterality Date  . Wisdom tooth extraction    . No past surgeries    . Hysteroscopy w/d&c N/A 04/06/2014    Procedure: DILATATION AND CURETTAGE /HYSTEROSCOPY ;  Surgeon: Tereso NewcomerUgonna A Anyanwu, MD;  Location: [facility];  Service: Gynecology;  Laterality: N/A;  . Dilation and curettage of uterus     Family History  Problem Relation Age of Onset  . Heart disease Mother   . Diabetes Father   . Heart disease Father   . Heart disease Maternal Grandmother   . Diabetes Maternal Grandfather   . Heart disease Maternal Grandfather   . Sleep apnea Mother    Social History  Substance Use Topics  . Smoking status: Former Smoker    Quit date: 01/04/2011  . Smokeless tobacco: Never Used     Comment: quit 2009  . Alcohol Use: 0.0 oz/week    0  Standard drinks or equivalent per week     Comment: occasonal    OB History    Gravida Para Term Preterm AB TAB SAB Ectopic Multiple Living   0              Review of Systems  Constitutional: Negative.   Musculoskeletal: Positive for back pain, joint swelling, gait problem and stiffness.  All other systems reviewed and are negative.   Allergies  Review of patient's allergies indicates no known allergies.  Home Medications   Prior to Admission medications   Medication Sig Start Date End Date Taking? Authorizing Provider  albuterol (PROVENTIL HFA;VENTOLIN HFA) 108 (90 Base) MCG/ACT inhaler Inhale 2 puffs into the lungs every 6 (six) hours as needed for wheezing or shortness of breath. 01/15/16  Yes Pete Glatterawn T Langeland, MD  fluticasone (FLOVENT HFA) 110 MCG/ACT inhaler Inhale 2 puffs into the lungs 2 (two) times daily. 01/15/16  Yes Pete Glatterawn T Langeland, MD  hydrochlorothiazide (HYDRODIURIL) 25 MG tablet Take 1 tablet (25 mg total) by mouth daily. 01/15/16  Yes Dawn Marland Mcalpine Langeland, MD  levothyroxine (SYNTHROID, LEVOTHROID) 50 MCG tablet Take 1 tablet (50 mcg total) by mouth daily. 01/16/16  Yes Dawn Marland Mcalpine Langeland,  MD  metoprolol tartrate (LOPRESSOR) 25 MG tablet Take 1 tablet (25 mg total) by mouth 2 (two) times daily. 01/15/16  Yes Pete Glatterawn T Langeland, MD  diclofenac (CATAFLAM) 50 MG tablet Take 1 tablet (50 mg total) by mouth 3 (three) times daily. 04/09/16   Linna HoffJames D Kindl, MD  ibuprofen (ADVIL,MOTRIN) 200 MG tablet Take 600 mg by mouth every 6 (six) hours as needed for moderate pain.    Historical Provider, MD  ibuprofen (ADVIL,MOTRIN) 600 MG tablet Take 1 tablet (600 mg total) by mouth every 6 (six) hours as needed. 02/09/15   Wilmer FloorLisa A Leftwich-Kirby, CNM  metFORMIN (GLUCOPHAGE) 500 MG tablet Take 1 tablet (500 mg total) by mouth 2 (two) times daily with a meal. 08/23/15   Rhona RaiderJacob J Stinson, DO  omeprazole (PRILOSEC) 40 MG capsule Take 1 capsule (40 mg total) by mouth daily. 01/15/16   Pete Glatterawn T Langeland, MD   Meds  Ordered and Administered this Visit  Medications - No data to display  BP 153/104 mmHg  Pulse 93  Temp(Src) 98.3 F (36.8 C) (Oral)  Resp 20  SpO2 98% No data found.   Physical Exam  Constitutional: She is oriented to person, place, and time. She appears well-developed and well-nourished. No distress.  Musculoskeletal: She exhibits tenderness.  Chondromalacia patella bilat.  Neurological: She is alert and oriented to person, place, and time.  Skin: Skin is warm and dry.  Nursing note and vitals reviewed.   ED Course  Procedures (including critical care time)  Labs Review Labs Reviewed - No data to display  Imaging Review No results found.   Visual Acuity Review  Right Eye Distance:   Left Eye Distance:   Bilateral Distance:    Right Eye Near:   Left Eye Near:    Bilateral Near:         MDM   1. Degenerative polyarthritis        Linna HoffJames D Kindl, MD 04/09/16 2047

## 2016-04-09 NOTE — ED Notes (Signed)
Patient presents with pain in left ankle radiating up towards bilateral knees x3 days, pt has taken Tylenol but has had no relief, pt thinks nausea could be associated with her pain No acute distress

## 2016-04-09 NOTE — Discharge Instructions (Signed)
Use medicine and splint as needed, see your doctor for orthopedic referral if further problems/

## 2016-04-23 ENCOUNTER — Telehealth: Payer: Self-pay | Admitting: Internal Medicine

## 2016-04-23 MED ORDER — LEVOTHYROXINE SODIUM 50 MCG PO TABS: 50.0000 ug | ORAL_TABLET | Freq: Every day | ORAL | Status: DC

## 2016-04-23 MED ORDER — HYDROCHLOROTHIAZIDE 25 MG PO TABS: 25.0000 mg | ORAL_TABLET | Freq: Every day | ORAL | Status: DC

## 2016-04-23 NOTE — Telephone Encounter (Signed)
Levothyroxine and hydrochlorothiazide refilled - patient due for follow up visit with Dr. Julien NordmannLangeland.

## 2016-04-23 NOTE — Telephone Encounter (Signed)
Pt called requesting medication refill on levothyroxine (SYNTHROID, LEVOTHROID) 50 MCG tablet,hydrochlorothiazide (HYDRODIURIL) 25 MG tablet. Please f/u

## 2016-05-27 ENCOUNTER — Telehealth (HOSPITAL_COMMUNITY): Payer: Self-pay | Admitting: *Deleted

## 2016-05-27 NOTE — Telephone Encounter (Signed)
Telephoned patient at home # and left message  

## 2016-06-23 ENCOUNTER — Encounter: Payer: Self-pay | Admitting: Internal Medicine

## 2016-06-23 ENCOUNTER — Ambulatory Visit: Payer: Self-pay | Attending: Internal Medicine | Admitting: Internal Medicine

## 2016-06-23 VITALS — BP 151/107 | HR 88 | Temp 98.3°F | Resp 16 | Wt 369.0 lb

## 2016-06-23 DIAGNOSIS — M25562 Pain in left knee: Secondary | ICD-10-CM | POA: Insufficient documentation

## 2016-06-23 DIAGNOSIS — E039 Hypothyroidism, unspecified: Secondary | ICD-10-CM | POA: Insufficient documentation

## 2016-06-23 DIAGNOSIS — Z6841 Body Mass Index (BMI) 40.0 and over, adult: Secondary | ICD-10-CM | POA: Insufficient documentation

## 2016-06-23 DIAGNOSIS — B009 Herpesviral infection, unspecified: Secondary | ICD-10-CM | POA: Insufficient documentation

## 2016-06-23 DIAGNOSIS — Z111 Encounter for screening for respiratory tuberculosis: Secondary | ICD-10-CM

## 2016-06-23 DIAGNOSIS — R0789 Other chest pain: Secondary | ICD-10-CM | POA: Insufficient documentation

## 2016-06-23 DIAGNOSIS — Z131 Encounter for screening for diabetes mellitus: Secondary | ICD-10-CM

## 2016-06-23 DIAGNOSIS — E282 Polycystic ovarian syndrome: Secondary | ICD-10-CM | POA: Insufficient documentation

## 2016-06-23 DIAGNOSIS — I10 Essential (primary) hypertension: Secondary | ICD-10-CM | POA: Insufficient documentation

## 2016-06-23 DIAGNOSIS — Z23 Encounter for immunization: Secondary | ICD-10-CM | POA: Insufficient documentation

## 2016-06-23 DIAGNOSIS — Z124 Encounter for screening for malignant neoplasm of cervix: Secondary | ICD-10-CM

## 2016-06-23 DIAGNOSIS — A749 Chlamydial infection, unspecified: Secondary | ICD-10-CM | POA: Insufficient documentation

## 2016-06-23 DIAGNOSIS — Z0100 Encounter for examination of eyes and vision without abnormal findings: Secondary | ICD-10-CM

## 2016-06-23 DIAGNOSIS — Z87440 Personal history of urinary (tract) infections: Secondary | ICD-10-CM | POA: Insufficient documentation

## 2016-06-23 DIAGNOSIS — D649 Anemia, unspecified: Secondary | ICD-10-CM | POA: Insufficient documentation

## 2016-06-23 DIAGNOSIS — M25561 Pain in right knee: Secondary | ICD-10-CM | POA: Insufficient documentation

## 2016-06-23 DIAGNOSIS — J45909 Unspecified asthma, uncomplicated: Secondary | ICD-10-CM | POA: Insufficient documentation

## 2016-06-23 LAB — POCT GLYCOSYLATED HEMOGLOBIN (HGB A1C): HEMOGLOBIN A1C: 5.5

## 2016-06-23 LAB — POCT URINE PREGNANCY: Preg Test, Ur: NEGATIVE

## 2016-06-23 MED ORDER — CYCLOBENZAPRINE HCL 10 MG PO TABS: 10.0000 mg | ORAL_TABLET | Freq: Three times a day (TID) | ORAL | 0 refills | Status: DC | PRN

## 2016-06-23 MED ORDER — HYDROCHLOROTHIAZIDE 25 MG PO TABS: 25.0000 mg | ORAL_TABLET | Freq: Every day | ORAL | 3 refills | Status: DC

## 2016-06-23 MED ORDER — BECLOMETHASONE DIPROPIONATE 40 MCG/ACT IN AERS: 2.0000 | INHALATION_SPRAY | Freq: Two times a day (BID) | RESPIRATORY_TRACT | 12 refills | Status: DC | PRN

## 2016-06-23 MED ORDER — METOPROLOL TARTRATE 50 MG PO TABS: 25.0000 mg | ORAL_TABLET | Freq: Two times a day (BID) | ORAL | 3 refills | Status: DC

## 2016-06-23 NOTE — Patient Instructions (Addendum)
Weight loss  Recommendations; LOW carbohydrate diet, less than  < 50 gram /day  Obesity Code - book by Dr Wylene Simmer   Influenza Virus Vaccine injection (Fluarix) What is this medicine? INFLUENZA VIRUS VACCINE (in floo EN zuh VAHY ruhs vak SEEN) helps to reduce the risk of getting influenza also known as the flu. This medicine may be used for other purposes; ask your health care provider or pharmacist if you have questions. What should I tell my health care provider before I take this medicine? They need to know if you have any of these conditions: -bleeding disorder like hemophilia -fever or infection -Guillain-Barre syndrome or other neurological problems -immune system problems -infection with the human immunodeficiency virus (HIV) or AIDS -low blood platelet counts -multiple sclerosis -an unusual or allergic reaction to influenza virus vaccine, eggs, chicken proteins, latex, gentamicin, other medicines, foods, dyes or preservatives -pregnant or trying to get pregnant -breast-feeding How should I use this medicine? This vaccine is for injection into a muscle. It is given by a health care professional. A copy of Vaccine Information Statements will be given before each vaccination. Read this sheet carefully each time. The sheet may change frequently. Talk to your pediatrician regarding the use of this medicine in children. Special care may be needed. Overdosage: If you think you have taken too much of this medicine contact a poison control center or emergency room at once. NOTE: This medicine is only for you. Do not share this medicine with others. What if I miss a dose? This does not apply. What may interact with this medicine? -chemotherapy or radiation therapy -medicines that lower your immune system like etanercept, anakinra, infliximab, and adalimumab -medicines that treat or prevent blood clots like warfarin -phenytoin -steroid medicines like prednisone or  cortisone -theophylline -vaccines This list may not describe all possible interactions. Give your health care provider a list of all the medicines, herbs, non-prescription drugs, or dietary supplements you use. Also tell them if you smoke, drink alcohol, or use illegal drugs. Some items may interact with your medicine. What should I watch for while using this medicine? Report any side effects that do not go away within 3 days to your doctor or health care professional. Call your health care provider if any unusual symptoms occur within 6 weeks of receiving this vaccine. You may still catch the flu, but the illness is not usually as bad. You cannot get the flu from the vaccine. The vaccine will not protect against colds or other illnesses that may cause fever. The vaccine is needed every year. What side effects may I notice from receiving this medicine? Side effects that you should report to your doctor or health care professional as soon as possible: -allergic reactions like skin rash, itching or hives, swelling of the face, lips, or tongue Side effects that usually do not require medical attention (report to your doctor or health care professional if they continue or are bothersome): -fever -headache -muscle aches and pains -pain, tenderness, redness, or swelling at site where injected -weak or tired This list may not describe all possible side effects. Call your doctor for medical advice about side effects. You may report side effects to FDA at 1-800-FDA-1088. Where should I keep my medicine? This vaccine is only given in a clinic, pharmacy, doctor's office, or other health care setting and will not be stored at home. NOTE: This sheet is a summary. It may not cover all possible information. If you have questions about this medicine,  talk to your doctor, pharmacist, or health care provider.    2016, Elsevier/Gold Standard. (2008-04-26 09:30:40)

## 2016-06-23 NOTE — Progress Notes (Signed)
Pt is in the office today for a physical and pap Pt states her pain level today in the office is a 5 Pt states the pain level is located on her right side Pt states the pain has been going on for a while

## 2016-06-23 NOTE — Progress Notes (Signed)
Lori Mcknight, is a 30 y.o. female  ZOX:[id number]  [id number]  DOB - 04-01-1986  Chief Complaint  Patient presents with  . Gynecologic Exam  . Annual Exam        Subjective:   Lori Avetisyan is a 30 y.o. female here today for a follow up visit.  Still has intermittent chest pains, breathing better overall and only using her albuterol mdi about 2x wk.  She has seen cardiology and felt atypical  Chest pain, not felt cardiac related. Cardiology felt her obesity is much greater risk to her health than atypical cp.  Trial of PPI made no difference to pt's cp.  Pt states she has been gaining more weight, does not know what to do for weight loss.  Co of bilat knee pains, progressive. Seen in ed 6/28 for djd as well  She stopped taking her thryoid medicine for time since thought it was causing her hair loss.  Patient has No headache, No chest pain, No abdominal pain - No Nausea, No new weakness tingling or numbness, No Cough - SOB.  Last menses July 29, spotting at times.  Hx of PCOS, not taking metformin though due to gi s/es.  Sexually active.  No problems updated.  ALLERGIES: No Known Allergies  PAST MEDICAL HISTORY: Past Medical History:  Diagnosis Date  . Anemia    history of anemia  . Asthma   . Chest pain of uncertain etiology    intermittent left side chest pain  . Chlamydia   . Genital herpes   . Hypertension    no meds since April  . Morbid obesity (HCC)   . Obese   . PCOS (polycystic ovarian syndrome)   . Shortness of breath    on exertion from weight  . Urinary tract infection     MEDICATIONS AT HOME: Prior to Admission medications   Medication Sig Start Date End Date Taking? Authorizing Provider  albuterol (PROVENTIL HFA;VENTOLIN HFA) 108 (90 Base) MCG/ACT inhaler Inhale 2 puffs into the lungs every 6 (six) hours as needed for wheezing or shortness of breath. 01/15/16  Yes Pete Glatterawn T Langeland, MD  hydrochlorothiazide (HYDRODIURIL) 25 MG tablet Take 1 tablet  (25 mg total) by mouth daily. Must have office visit for refills 06/23/16  Yes Pete Glatterawn T Langeland, MD  ibuprofen (ADVIL,MOTRIN) 600 MG tablet Take 1 tablet (600 mg total) by mouth every 6 (six) hours as needed. 02/09/15  Yes Lisa A Leftwich-Kirby, CNM  levothyroxine (SYNTHROID, LEVOTHROID) 50 MCG tablet Take 1 tablet (50 mcg total) by mouth daily. Must have office visit for refills 04/23/16  Yes Pete Glatterawn T Langeland, MD  metoprolol tartrate (LOPRESSOR) 50 MG tablet Take 0.5 tablets (25 mg total) by mouth 2 (two) times daily. 06/23/16  Yes Pete Glatterawn T Langeland, MD  beclomethasone (QVAR) 40 MCG/ACT inhaler Inhale 2 puffs into the lungs 2 (two) times daily as needed (sob). 06/23/16   Pete Glatterawn T Langeland, MD  cyclobenzaprine (FLEXERIL) 10 MG tablet Take 1 tablet (10 mg total) by mouth 3 (three) times daily as needed for muscle spasms. 06/23/16   Pete Glatterawn T Langeland, MD  diclofenac (CATAFLAM) 50 MG tablet Take 1 tablet (50 mg total) by mouth 3 (three) times daily. Patient not taking: Reported on 06/23/2016 04/09/16   Linna HoffJames D Kindl, MD  fluticasone (FLOVENT HFA) 110 MCG/ACT inhaler Inhale 2 puffs into the lungs 2 (two) times daily. Patient not taking: Reported on 06/23/2016 01/15/16   Pete Glatterawn T Langeland, MD  ibuprofen (ADVIL,MOTRIN) 200 MG tablet  Take 600 mg by mouth every 6 (six) hours as needed for moderate pain.    Historical Provider, MD     Objective:   Vitals:   06/23/16 1541  BP: (!) 151/107  Pulse: 88  Resp: 16  Temp: 98.3 F (36.8 C)  TempSrc: Oral  SpO2: 97%  Weight: (!) 369 lb (167.4 kg)   cma assisted w/ pap.  Exam General appearance : Awake, alert, not in any distress. Speech Clear. Not toxic looking, extreme morbid obese.  HEENT: Atraumatic and Normocephalic, pupils equally reactive to light. Wearing wig or hair weave. Neck: supple, no JVD. Chest:Good air entry bilaterally, no added sounds. Breast /axilla: bilat nml appearance, not dippling noted. No palpable masses/nodules/nipple discharge noted on  exam  CVS: S1 S2 regular, no murmurs/gallups or rubs. Abdomen: Bowel sounds active, obese,. Non tender. Limited to body habitus. Pelvic Exam: Cervix normal in appearance, external genitalia normal, no adnexal masses or tenderness, no cervical motion tenderness, rectovaginal septum normal, uterus normal size, shape, and consistency and vagina normal without discharge   Extremities: B/L Lower Ext shows no edema, including knees,  both legs are warm to touch Neurology: Awake alert, and oriented X 3, CN II-XII grossly intact, Non focal Skin:No Rash  Data Review Lab Results  Component Value Date   HGBA1C 5.5 06/23/2016    Depression screen [facility] 2/9 06/23/2016 01/15/2016  Decreased Interest (No Data) 1  Down, Depressed, Hopeless 0 0  PHQ - 2 Score 0 1      Assessment & Plan   1. Pap smear for cervical cancer screening - Cytology - PAP - POCT urine pregnancy  - Neg  2. Flu vaccine need - Flu Vaccine QUAD 36+ mos PF IM (Fluarix & Fluzone Quad PF)  3. Diabetes mellitus screening - HgB A1c 5.4  4. Encounter for PPD test (for wk physical) - PPD  5. Obesity, Class III, BMI 40-49.9 (morbid obesity) (HCC)  Recd low carb diet , <50gm for weight loss, recd read The Obesity Code, by Dr Wylene Simmer.  6. Essential hypertension Uncontrolled, recd low salt diet Increase metoprolol to 50bid  7. Hypothyroidism, unspecified hypothyroidism type Urged to take her synthroid, hypothyrodism can cause weight gain/hair loss, etc. - TSH - T4, Free  8. Encounter for vision and hearing test screening For wk phsycical - Visual acuity screening  9. 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  The patient was given clear instructions to go to ER or return to medical center if symptoms don't improve, worsen or new  problems develop. The patient verbalized understanding. The patient was told to call to get lab results if they haven't heard anything in the next week.   This note has been created with Education officer, environmental. Any transcriptional errors are unintentional.   Pete Glatter, MD, MBA/MHA [facility] [place] [facility] [place], [place] [phone]   06/23/2016, 5:42 PM

## 2016-06-24 ENCOUNTER — Encounter: Payer: Self-pay | Admitting: Internal Medicine

## 2016-06-24 ENCOUNTER — Ambulatory Visit: Payer: Self-pay | Attending: Internal Medicine

## 2016-06-24 DIAGNOSIS — Z111 Encounter for screening for respiratory tuberculosis: Secondary | ICD-10-CM | POA: Insufficient documentation

## 2016-06-24 DIAGNOSIS — E039 Hypothyroidism, unspecified: Secondary | ICD-10-CM

## 2016-06-24 LAB — CERVICOVAGINAL ANCILLARY ONLY: Wet Prep (BD Affirm): POSITIVE — AB

## 2016-06-24 LAB — CYTOLOGY - PAP

## 2016-06-24 MED FILL — HYDROCHLOROTHIAZIDE 25 MG T: 25 | 30 days supply | Qty: 30 | Fill #0

## 2016-06-24 MED FILL — QVAR 40 MCG ORAL INHALER: 40 | 30 days supply | Qty: 9 | Fill #0

## 2016-06-24 MED FILL — ?METOPROLOL 50 MG TABLET: 50 | 30 days supply | Qty: 30 | Fill #0

## 2016-06-24 MED FILL — ?CYCLOBENZAPRINE 10 MG TABL: 10 | 10 days supply | Qty: 30 | Fill #0

## 2016-06-24 NOTE — Progress Notes (Signed)
Pt is here for lab work only. Unable to obtain specimen on 06/23/2016.

## 2016-06-24 NOTE — Progress Notes (Signed)
Pt back here for labs. Her left arm from recent flu shot slightly erythematous, edema about 1 inch circum.  Recd keep rubbing the area, warm heat and prn motrin.  Suspect local reaction from flu shot.  Rounded w/ rn . dw pt, answered all questions.

## 2016-06-25 ENCOUNTER — Ambulatory Visit: Payer: Self-pay

## 2016-06-25 ENCOUNTER — Other Ambulatory Visit: Payer: Self-pay | Admitting: Internal Medicine

## 2016-06-25 LAB — TSH: TSH: 14.77 mIU/L — ABNORMAL HIGH

## 2016-06-25 LAB — T4, FREE: Free T4: 0.8 ng/dL (ref 0.8–1.8)

## 2016-06-25 MED ORDER — LEVOTHYROXINE SODIUM 75 MCG PO TABS: 75.0000 ug | ORAL_TABLET | Freq: Every day | ORAL | 3 refills | Status: DC

## 2016-06-25 MED ORDER — METRONIDAZOLE 500 MG PO TABS: 500.0000 mg | ORAL_TABLET | Freq: Two times a day (BID) | ORAL | 0 refills | Status: DC

## 2016-06-30 LAB — CERVICOVAGINAL ANCILLARY ONLY: Herpes: NEGATIVE

## 2016-07-01 ENCOUNTER — Telehealth: Payer: Self-pay | Admitting: Internal Medicine

## 2016-07-01 NOTE — Telephone Encounter (Signed)
Pt called requesting pap smear results, please f/up °

## 2016-07-01 NOTE — Telephone Encounter (Signed)
Have not received results. Once I receive them I will notify patient Thanks.

## 2016-07-08 ENCOUNTER — Telehealth: Payer: Self-pay | Admitting: Internal Medicine

## 2016-07-08 NOTE — Telephone Encounter (Signed)
Patient is needing the lab results. Patient is okay to with a voicemail

## 2016-07-09 ENCOUNTER — Telehealth: Payer: Self-pay

## 2016-07-09 NOTE — Telephone Encounter (Signed)
Pt returned call and is aware of her lab results pt doesn't have any questions or concerns

## 2016-07-09 NOTE — Telephone Encounter (Signed)
Contacted pt to make aware that [facility] but if she is unable to view them she can give me a call at her earliest convenience and I would go over them with her

## 2016-08-11 MED FILL — ?METRONIDAZOLE 500 MG TABLE: 500 | 7 days supply | Qty: 14 | Fill #0

## 2016-08-11 MED FILL — LEVOTHYROXINE 75 MCG TABLET: 75 | 30 days supply | Qty: 30 | Fill #0

## 2016-08-15 MED FILL — ?METOPROLOL 50 MG TABLET: 50 | 30 days supply | Qty: 30 | Fill #1

## 2016-08-16 ENCOUNTER — Emergency Department (HOSPITAL_COMMUNITY)
Admission: EM | Admit: 2016-08-16 | Discharge: 2016-08-16 | Disposition: A | Discharge status: Home / Self Care | Payer: Self-pay | Attending: Emergency Medicine | Admitting: Emergency Medicine

## 2016-08-16 ENCOUNTER — Emergency Department (HOSPITAL_COMMUNITY): Payer: Self-pay

## 2016-08-16 ENCOUNTER — Encounter (HOSPITAL_COMMUNITY): Payer: Self-pay | Admitting: Emergency Medicine

## 2016-08-16 ENCOUNTER — Ambulatory Visit (HOSPITAL_COMMUNITY)
Admission: EM | Admit: 2016-08-16 | Discharge: 2016-08-16 | Disposition: A | Discharge status: Home / Self Care | Payer: Self-pay | Attending: Emergency Medicine | Admitting: Emergency Medicine

## 2016-08-16 ENCOUNTER — Encounter (HOSPITAL_COMMUNITY): Payer: Self-pay

## 2016-08-16 DIAGNOSIS — Z6841 Body Mass Index (BMI) 40.0 and over, adult: Secondary | ICD-10-CM | POA: Insufficient documentation

## 2016-08-16 DIAGNOSIS — R110 Nausea: Secondary | ICD-10-CM

## 2016-08-16 DIAGNOSIS — I10 Essential (primary) hypertension: Secondary | ICD-10-CM | POA: Insufficient documentation

## 2016-08-16 DIAGNOSIS — R51 Headache: Secondary | ICD-10-CM

## 2016-08-16 DIAGNOSIS — R42 Dizziness and giddiness: Secondary | ICD-10-CM

## 2016-08-16 DIAGNOSIS — R519 Headache, unspecified: Secondary | ICD-10-CM

## 2016-08-16 DIAGNOSIS — G43009 Migraine without aura, not intractable, without status migrainosus: Secondary | ICD-10-CM | POA: Insufficient documentation

## 2016-08-16 DIAGNOSIS — J45909 Unspecified asthma, uncomplicated: Secondary | ICD-10-CM | POA: Insufficient documentation

## 2016-08-16 DIAGNOSIS — Z87891 Personal history of nicotine dependence: Secondary | ICD-10-CM | POA: Insufficient documentation

## 2016-08-16 HISTORY — DX: Disorder of thyroid, unspecified: E07.9

## 2016-08-16 LAB — BASIC METABOLIC PANEL
Anion gap: 8 (ref 5–15)
BUN: 6 mg/dL (ref 6–20)
CO2: 23 mmol/L (ref 22–32)
CREATININE: 0.67 mg/dL (ref 0.44–1.00)
Calcium: 9.4 mg/dL (ref 8.9–10.3)
Chloride: 107 mmol/L (ref 101–111)
GFR calc Af Amer: >60 mL/min (ref >60)
Glucose, Bld: 92 mg/dL (ref 65–99)
POTASSIUM: 3.7 mmol/L (ref 3.5–5.1)
SODIUM: 138 mmol/L (ref 135–145)

## 2016-08-16 LAB — CBC
HCT: 45.5 % (ref 36.0–46.0)
Hemoglobin: 15.2 g/dL — ABNORMAL HIGH (ref 12.0–15.0)
MCH: 31.1 pg (ref 26.0–34.0)
MCHC: 33.4 g/dL (ref 30.0–36.0)
MCV: 93 fL (ref 78.0–100.0)
PLATELETS: 223 10*3/uL (ref 150–400)
RBC: 4.89 MIL/uL (ref 3.87–5.11)
RDW: 12.4 % (ref 11.5–15.5)
WBC: 7.4 10*3/uL (ref 4.0–10.5)

## 2016-08-16 LAB — I-STAT TROPONIN, ED: Troponin i, poc: 0 ng/mL (ref 0.00–0.08)

## 2016-08-16 MED ORDER — METOCLOPRAMIDE HCL 5 MG/ML IJ SOLN
10.0000 mg | Freq: Once | INTRAMUSCULAR | Status: AC
  Administered 2016-08-16: 10 mg via INTRAVENOUS
  Filled 2016-08-16: qty 2

## 2016-08-16 MED ORDER — DIPHENHYDRAMINE HCL 50 MG/ML IJ SOLN
25.0000 mg | Freq: Once | INTRAMUSCULAR | Status: AC
  Administered 2016-08-16: 25 mg via INTRAVENOUS
  Filled 2016-08-16: qty 1

## 2016-08-16 MED ORDER — KETOROLAC TROMETHAMINE 30 MG/ML IJ SOLN
30.0000 mg | Freq: Once | INTRAMUSCULAR | Status: AC
  Administered 2016-08-16: 30 mg via INTRAVENOUS
  Filled 2016-08-16: qty 1

## 2016-08-16 MED ORDER — SODIUM CHLORIDE 0.9 % IV BOLUS (SEPSIS)
1000.0000 mL | Freq: Once | INTRAVENOUS | Status: AC
  Administered 2016-08-16: 1000 mL via INTRAVENOUS

## 2016-08-16 MED ORDER — DEXAMETHASONE SODIUM PHOSPHATE 10 MG/ML IJ SOLN
10.0000 mg | Freq: Once | INTRAMUSCULAR | Status: AC
  Administered 2016-08-16: 10 mg via INTRAVENOUS
  Filled 2016-08-16: qty 1

## 2016-08-16 MED ORDER — METOCLOPRAMIDE HCL 10 MG PO TABS: 10.0000 mg | ORAL_TABLET | Freq: Four times a day (QID) | ORAL | 0 refills | Status: DC | PRN

## 2016-08-16 NOTE — ED Provider Notes (Signed)
CSN: [id number]     Arrival date & time 08/16/16  1437 History   First MD Initiated Contact with Patient 08/16/16 1622     Chief Complaint  Patient presents with  . Headache   (Consider location/radiation/quality/duration/timing/severity/associated sxs/prior Treatment) 30 year old female presents with frontal headache that has become more severe and spread to rest of head over the past week. Now having random shooting pains in her head, feeling dizzy, weak and blurred vision. Also experiencing central chest pain that is radiating to her left shoulder. Has no history of headaches or migraines. Has taken Ibuprofen and Tylenol with no relief. Does have history of high blood pressure and currently on medication.    The history is provided by the patient.    Past Medical History:  Diagnosis Date  . Anemia    history of anemia  . Asthma   . Chest pain of uncertain etiology    intermittent left side chest pain  . Chlamydia   . Genital herpes   . Hypertension    no meds since April  . Morbid obesity (HCC)   . Obese   . PCOS (polycystic ovarian syndrome)   . Shortness of breath    on exertion from weight  . Urinary tract infection    Past Surgical History:  Procedure Laterality Date  . DILATION AND CURETTAGE OF UTERUS    . HYSTEROSCOPY W/D&C N/A 04/06/2014   Procedure: DILATATION AND CURETTAGE /HYSTEROSCOPY ;  Surgeon: Tereso NewcomerUgonna A Anyanwu, MD;  Location: [facility];  Service: Gynecology;  Laterality: N/A;  . NO PAST SURGERIES    . WISDOM TOOTH EXTRACTION     Family History  Problem Relation Age of Onset  . Heart disease Mother   . Sleep apnea Mother   . Diabetes Father   . Heart disease Father   . Heart disease Maternal Grandmother   . Diabetes Maternal Grandfather   . Heart disease Maternal Grandfather    Social History  Substance Use Topics  . Smoking status: Former Smoker    Quit date: 01/04/2011  . Smokeless tobacco: Never Used     Comment: quit 2009  . Alcohol use 0.0  oz/week     Comment: occasonal    OB History    Gravida Para Term Preterm AB Living   0             SAB TAB Ectopic Multiple Live Births                 Review of Systems  Constitutional: Positive for fatigue. Negative for chills and fever.  HENT: Negative for congestion.   Eyes: Positive for photophobia and visual disturbance. Negative for discharge.  Respiratory: Negative for cough, chest tightness, shortness of breath and wheezing.   Cardiovascular: Positive for chest pain.  Gastrointestinal: Positive for nausea. Negative for abdominal pain, diarrhea and vomiting.  Musculoskeletal: Negative for back pain.  Skin: Negative for rash.  Neurological: Positive for dizziness, weakness, light-headedness and headaches. Negative for syncope and numbness.    Allergies  Review of patient's allergies indicates no known allergies.  Home Medications   Prior to Admission medications   Medication Sig Start Date End Date Taking? Authorizing Provider  levothyroxine (SYNTHROID, LEVOTHROID) 75 MCG tablet Take 1 tablet (75 mcg total) by mouth daily. 06/25/16  Yes Pete Glatterawn T Langeland, MD  metoprolol tartrate (LOPRESSOR) 50 MG tablet Take 0.5 tablets (25 mg total) by mouth 2 (two) times daily. 06/23/16  Yes Pete Glatterawn T Langeland, MD  albuterol (PROVENTIL HFA;VENTOLIN HFA) 108 (90 Base) MCG/ACT inhaler Inhale 2 puffs into the lungs every 6 (six) hours as needed for wheezing or shortness of breath. 01/15/16   Pete Glatterawn T Langeland, MD  beclomethasone (QVAR) 40 MCG/ACT inhaler Inhale 2 puffs into the lungs 2 (two) times daily as needed (sob). 06/23/16   Pete Glatterawn T Langeland, MD  hydrochlorothiazide (HYDRODIURIL) 25 MG tablet Take 1 tablet (25 mg total) by mouth daily. Must have office visit for refills 06/23/16   Pete Glatterawn T Langeland, MD  ibuprofen (ADVIL,MOTRIN) 200 MG tablet Take 600 mg by mouth every 6 (six) hours as needed for moderate pain.    Historical Provider, MD   Meds Ordered and Administered this Visit  Medications -  No data to display  BP 133/74 (BP Location: Left Arm) Comment (BP Location): regular cuff, forearm  Pulse 77   Temp 98.7 F (37.1 C) (Oral)   Resp 24   LMP 07/28/2016   SpO2 99%  No data found.   Physical Exam  Constitutional: She is oriented to person, place, and time. She appears well-developed and well-nourished. She appears ill.  HENT:  Head: Normocephalic and atraumatic.  Right Ear: Hearing, tympanic membrane, external ear and ear canal normal.  Left Ear: Hearing, tympanic membrane, external ear and ear canal normal.  Nose: Right sinus exhibits frontal sinus tenderness. Left sinus exhibits frontal sinus tenderness.  Mouth/Throat: Uvula is midline, oropharynx is clear and moist and mucous membranes are normal.  Eyes: Conjunctivae, EOM and lids are normal. Pupils are equal, round, and reactive to light.  Patient having difficulty keeping eyes open when performing ophthalmic exam. Experiencing quivering of EOM's but normal movements.   Neck: Normal range of motion. Neck supple.  Cardiovascular: Normal rate, regular rhythm and normal heart sounds.   Pulmonary/Chest: Effort normal and breath sounds normal. No respiratory distress. She has no wheezes. She has no rales.  Musculoskeletal: Normal range of motion.  Lymphadenopathy:    She has no cervical adenopathy.  Neurological: She is alert and oriented to person, place, and time. She has normal strength. No cranial nerve deficit or sensory deficit. Abnormal coordination: unable to access due to dizziness.  Eye lids are fluttering when trying to access eye movement or asked to perform Cranial Nerve exam. Patient having difficulty focusing on provider during exam.   Skin: Skin is warm and dry. Capillary refill takes less than 2 seconds.  Psychiatric: She has a normal mood and affect. Her behavior is normal.    Urgent Care Course   Clinical Course    Procedures (including critical care time)  Labs Review Labs Reviewed - No data  to display  Imaging Review No results found.   Visual Acuity Review  Right Eye Distance:   Left Eye Distance:   Bilateral Distance:    Right Eye Near:   Left Eye Near:    Bilateral Near:         MDM   1. Acute nonintractable headache, unspecified headache type   2. Vertigo   3. Nausea    Due to no prior history of severe headaches or migraines and now experiencing dizziness and nausea with blurred vision, recommend further evaluation in the ER. Patient and caregiver agree and will be evaluated this evening in the ER.     Sudie GrumblingAnn Berry Laneshia Pina, NP 08/16/16 [phone]

## 2016-08-16 NOTE — Discharge Instructions (Signed)
Go to ER now for further evaluation of symptoms.

## 2016-08-16 NOTE — ED Notes (Signed)
Patient transported to X-ray 

## 2016-08-16 NOTE — ED Triage Notes (Signed)
Sharp pain in head for a week, intermittent.  Nausea associated with headache.   Center chest pain intermittent radiating into shoulder and neck.  Pain worsens with movement intermittently.

## 2016-08-16 NOTE — ED Triage Notes (Signed)
Patient complains of 1 week of headache with nausea and blurred vision with chest pain and nausea. Denies cold symptoms, no cough. NAD

## 2016-08-16 NOTE — ED Provider Notes (Signed)
[facility]-EMERGENCY DEPT Provider Note   CSN: [id number] Arrival date & time: 08/16/16  1657     History   Chief Complaint Chief Complaint  Patient presents with  . Headache  . Chest Pain    HPI Lori Mcknight is a 30 y.o. female.  She complains of one-week history of a bifrontal headache. Headache is sharp and she rates it at 9/10. It is worse with exposure to light and smells. She is also seeing some flashes of light. There's been nausea but no vomiting. She denies weakness, numbness, tingling. She has taken ibuprofen and naproxen without relief. She does not have history of similar headaches in the past. There has been some aching in her shoulders but she denies any other pain. She denies fever, chills. She denies sore throat or cough. She denies constipation or diarrhea or urinary symptoms.    Headache    Chest Pain   Associated symptoms include headaches.    Past Medical History:  Diagnosis Date  . Anemia    history of anemia  . Asthma   . Chest pain of uncertain etiology    intermittent left side chest pain  . Chlamydia   . Genital herpes   . Hypertension    no meds since April  . Morbid obesity (HCC)   . Obese   . PCOS (polycystic ovarian syndrome)   . Shortness of breath    on exertion from weight  . Thyroid disease   . Urinary tract infection     Patient Active Problem List   Diagnosis Date Noted  . Chest pain 02/04/2016  . Abnormal uterine bleeding (AUB) 01/09/2014  . Endometrial thickening on ultra sound 12/02/2013  . Hypertension 10/21/2013  . Trichomonal vaginitis 11/11/2012  . PCOS (polycystic ovarian syndrome) 11/06/2011  . Obesity, Class III, BMI 40-49.9 (morbid obesity) (HCC) 11/06/2011  . Infertility, female 11/06/2011  . Pelvic pain in female 08/01/2011    Past Surgical History:  Procedure Laterality Date  . DILATION AND CURETTAGE OF UTERUS    . HYSTEROSCOPY W/D&C N/A 04/06/2014   Procedure: DILATATION AND CURETTAGE /HYSTEROSCOPY ;   Surgeon: Tereso NewcomerUgonna A Anyanwu, MD;  Location: [facility];  Service: Gynecology;  Laterality: N/A;  . NO PAST SURGERIES    . WISDOM TOOTH EXTRACTION      OB History    Gravida Para Term Preterm AB Living   0             SAB TAB Ectopic Multiple Live Births                   Home Medications    Prior to Admission medications   Medication Sig Start Date End Date Taking? Authorizing Provider  albuterol (PROVENTIL HFA;VENTOLIN HFA) 108 (90 Base) MCG/ACT inhaler Inhale 2 puffs into the lungs every 6 (six) hours as needed for wheezing or shortness of breath. 01/15/16   Pete Glatterawn T Langeland, MD  beclomethasone (QVAR) 40 MCG/ACT inhaler Inhale 2 puffs into the lungs 2 (two) times daily as needed (sob). 06/23/16   Pete Glatterawn T Langeland, MD  hydrochlorothiazide (HYDRODIURIL) 25 MG tablet Take 1 tablet (25 mg total) by mouth daily. Must have office visit for refills 06/23/16   Pete Glatterawn T Langeland, MD  ibuprofen (ADVIL,MOTRIN) 200 MG tablet Take 600 mg by mouth every 6 (six) hours as needed for moderate pain.    Historical Provider, MD  levothyroxine (SYNTHROID, LEVOTHROID) 75 MCG tablet Take 1 tablet (75 mcg total) by mouth daily. 06/25/16  Pete Glatterawn T Langeland, MD  metoprolol tartrate (LOPRESSOR) 50 MG tablet Take 0.5 tablets (25 mg total) by mouth 2 (two) times daily. 06/23/16   Pete Glatterawn T Langeland, MD    Family History Family History  Problem Relation Age of Onset  . Heart disease Mother   . Sleep apnea Mother   . Diabetes Father   . Heart disease Father   . Heart disease Maternal Grandmother   . Diabetes Maternal Grandfather   . Heart disease Maternal Grandfather     Social History Social History  Substance Use Topics  . Smoking status: Former Smoker    Quit date: 01/04/2011  . Smokeless tobacco: Never Used     Comment: quit 2009  . Alcohol use 0.0 oz/week     Comment: occasonal      Allergies   Review of patient's allergies indicates no known allergies.   Review of Systems Review of Systems    Cardiovascular: Positive for chest pain.  Neurological: Positive for headaches.  All other systems reviewed and are negative.    Physical Exam Updated Vital Signs BP 120/80 (BP Location: Right Arm)   Pulse 84   Temp 98.1 F (36.7 C) (Oral)   Resp 20   Ht 5\' 6"  (1.676 m)   Wt (!) 373 lb 4.8 oz (169.3 kg)   LMP 07/28/2016   SpO2 99%   BMI 60.25 kg/m   Physical Exam  Nursing note and vitals reviewed.  Morbidly obese 30 year old female, resting comfortably and in no acute distress. Vital signs are normal. Oxygen saturation is 99%, which is normal. Head is normocephalic and atraumatic. PERRLA, EOMI. Oropharynx is clear. Fundi show no hemorrhage, exudate, or papilledema. There is no tenderness to palpation over the eyebrows muscles or over the paracervical muscles. Neck is nontender and supple without adenopathy or JVD. Back is nontender and there is no CVA tenderness. Lungs are clear without rales, wheezes, or rhonchi. Chest is nontender. Heart has regular rate and rhythm without murmur. Abdomen is soft, flat, nontender without masses or hepatosplenomegaly and peristalsis is normoactive. Extremities have no cyanosis or edema, full range of motion is present. Skin is warm and dry without rash. Neurologic: Mental status is normal, cranial nerves are intact, there are no motor or sensory deficits.  ED Treatments / Results  Labs (all labs ordered are listed, but only abnormal results are displayed) Labs Reviewed  CBC - Abnormal; Notable for the following:       Result Value   Hemoglobin 15.2 (*)    All other components within normal limits  BASIC METABOLIC PANEL  I-STAT TROPOININ, ED    EKG  EKG Interpretation  Date/Time:  Saturday August 16 2016 17:01:35 EDT Ventricular Rate:  87 PR Interval:  140 QRS Duration: 82 QT Interval:  340 QTC Calculation: 409 R Axis:   76 Text Interpretation:  Normal sinus rhythm Possible Left atrial enlargement T wave abnormality,  consider inferior ischemia Abnormal ECG When compared with ECG of 01/08/2016, T wave abnormality is now Present Confirmed by [facility]  MD, Elmin Wiederholt ([id number]) on 08/16/2016 5:07:53 PM       Procedures Procedures (including critical care time)  Medications Ordered in ED Medications  sodium chloride 0.9 % bolus 1,000 mL (not administered)  metoCLOPramide (REGLAN) injection 10 mg (not administered)  diphenhydrAMINE (BENADRYL) injection 25 mg (not administered)  ketorolac (TORADOL) 30 MG/ML injection 30 mg (not administered)  dexamethasone (DECADRON) injection 10 mg (not administered)     Initial Impression / Assessment  and Plan / ED Course  I have reviewed the triage vital signs and the nursing notes.  Pertinent lab results that were available during my care of the patient were reviewed by me and considered in my medical decision making (see chart for details).  Clinical Course   Headache which sounds most compatible with a migraine variant. At this point, I do not see indications for imaging. Old records are reviewed and she was sent here from urgent care, but I do not see prior visits for headaches. Will give a trial of a migraine cocktail of normal saline, metoclopramide, diphenhydramine, ketorolac, dexamethasone.  She had excellent relief of her headache with above noted treatment. She is discharged with prescription for metoclopramide.  Final Clinical Impressions(s) / ED Diagnoses   Final diagnoses:  Migraine without aura and without status migrainosus, not intractable    New Prescriptions New Prescriptions   METOCLOPRAMIDE (REGLAN) 10 MG TABLET    Take 1 tablet (10 mg total) by mouth every 6 (six) hours as needed for nausea (or headache).     Dione Booze, MD 08/16/16 2134

## 2016-08-20 MED FILL — METOCLOPRAMIDE 10 MG TABLET: 10 | 7 days supply | Qty: 30 | Fill #0

## 2016-08-26 ENCOUNTER — Ambulatory Visit: Payer: Self-pay | Attending: Internal Medicine | Admitting: Physician Assistant

## 2016-08-26 VITALS — BP 120/90 | HR 72 | Temp 98.7°F | Resp 20 | Ht 66.0 in | Wt 371.0 lb

## 2016-08-26 DIAGNOSIS — R079 Chest pain, unspecified: Secondary | ICD-10-CM | POA: Insufficient documentation

## 2016-08-26 DIAGNOSIS — Z8249 Family history of ischemic heart disease and other diseases of the circulatory system: Secondary | ICD-10-CM | POA: Insufficient documentation

## 2016-08-26 DIAGNOSIS — Z7951 Long term (current) use of inhaled steroids: Secondary | ICD-10-CM | POA: Insufficient documentation

## 2016-08-26 DIAGNOSIS — I10 Essential (primary) hypertension: Secondary | ICD-10-CM | POA: Insufficient documentation

## 2016-08-26 DIAGNOSIS — E039 Hypothyroidism, unspecified: Secondary | ICD-10-CM | POA: Insufficient documentation

## 2016-08-26 DIAGNOSIS — G43909 Migraine, unspecified, not intractable, without status migrainosus: Secondary | ICD-10-CM | POA: Insufficient documentation

## 2016-08-26 DIAGNOSIS — R9431 Abnormal electrocardiogram [ECG] [EKG]: Secondary | ICD-10-CM

## 2016-08-26 DIAGNOSIS — J45909 Unspecified asthma, uncomplicated: Secondary | ICD-10-CM | POA: Insufficient documentation

## 2016-08-26 DIAGNOSIS — Z79899 Other long term (current) drug therapy: Secondary | ICD-10-CM | POA: Insufficient documentation

## 2016-08-26 DIAGNOSIS — G43109 Migraine with aura, not intractable, without status migrainosus: Secondary | ICD-10-CM

## 2016-08-26 DIAGNOSIS — R0789 Other chest pain: Secondary | ICD-10-CM

## 2016-08-26 MED ORDER — SUMATRIPTAN SUCCINATE 50 MG PO TABS: 50.0000 mg | ORAL_TABLET | ORAL | 0 refills | Status: DC | PRN

## 2016-08-26 NOTE — Progress Notes (Signed)
C/o chest pain x 6-7 months.  Ha's in am. [facility], did not return do to service.

## 2016-08-26 NOTE — Progress Notes (Signed)
Chief Complaint: Follow up from ED visit 08/16/16  Subjective: This is a 30 yo female with hx HTN, Asthma, CP, and hypothyroidism who went to the Urgent Care on 08/16/16 with HAs and recurrent CP.   HAs-new. Bifrontal. Light hurts. Occasional nausea. No imaging done. Migraine cocktail relieved sxs. Still with recurrences. Script for Reglan as needed given.  CP-times months. Left chest with radiation to neck. Lasts minutes. Assoc SHOB at times. More freq lately. Very worried considering her fam hx. Troponin negative. EKG did show new T wave inversion. Fam hx CAD. Seen by [facility] in pst and felt to be atypical.   ROS:  GEN: denies fever or chills, denies change in weight Skin: denies lesions or rashes HEENT: + headache, earache, epistaxis, sore throat, or neck pain LUNGS: denies SHOB, dyspnea, PND, orthopnea CV: + CP or palpitations ABD: denies abd pain, N or V EXT: denies muscle spasms or swelling; no pain in lower ext, no weakness NEURO: denies numbness or tingling, denies sz, stroke or TIA   Objective:  Vitals:   08/26/16 1421  BP: 120/90  Pulse: 72  Resp: 20  Temp: 98.7 F (37.1 C)  TempSrc: Oral  SpO2: 99%  Weight: (!) 371 lb (168.3 kg)  Height: 5\' 6"  (1.676 m)    Physical Exam:benign  General: in no acute distress. Obese. HEENT: no pallor, no icterus, moist oral mucosa, no JVD, no lymphadenopathy Heart: Normal  s1 &s2  Regular rate and rhythm, without murmurs, rubs, gallops. Lungs: Clear to auscultation bilaterally. Abdomen: Soft, nontender, nondistended, positive bowel sounds. Extremities: No clubbing cyanosis or edema with positive pedal pulses. Neuro: Alert, awake, oriented x3, nonfocal.   Medications: Prior to Admission medications   Medication Sig Start Date End Date Taking? Authorizing Provider  albuterol (PROVENTIL HFA;VENTOLIN HFA) 108 (90 Base) MCG/ACT inhaler Inhale 2 puffs into the lungs every 6 (six) hours as needed for wheezing or shortness of breath.  01/15/16  Yes Pete Glatterawn T Langeland, MD  beclomethasone (QVAR) 40 MCG/ACT inhaler Inhale 2 puffs into the lungs 2 (two) times daily as needed (sob). Patient taking differently: Inhale 2 puffs into the lungs 2 (two) times daily as needed (shortness of breath).  06/23/16  Yes Pete Glatterawn T Langeland, MD  hydrochlorothiazide (HYDRODIURIL) 25 MG tablet Take 1 tablet (25 mg total) by mouth daily. Must have office visit for refills 06/23/16  Yes Pete Glatterawn T Langeland, MD  ibuprofen (ADVIL,MOTRIN) 200 MG tablet Take 600 mg by mouth every 6 (six) hours as needed for moderate pain.   Yes Historical Provider, MD  levothyroxine (SYNTHROID, LEVOTHROID) 75 MCG tablet Take 1 tablet (75 mcg total) by mouth daily. 06/25/16  Yes Pete Glatterawn T Langeland, MD  metoprolol tartrate (LOPRESSOR) 50 MG tablet Take 0.5 tablets (25 mg total) by mouth 2 (two) times daily. 06/23/16  Yes Pete Glatterawn T Langeland, MD  metoCLOPramide (REGLAN) 10 MG tablet Take 1 tablet (10 mg total) by mouth every 6 (six) hours as needed for nausea (or headache). Patient not taking: Reported on 08/26/2016 08/16/16   Dione Boozeavid Glick, MD  metroNIDAZOLE (FLAGYL) 500 MG tablet Take 500 mg by mouth 2 (two) times daily. 7 day course started 08/15/16    Historical Provider, MD  naproxen sodium (ALEVE) 220 MG tablet Take 220 mg by mouth 2 (two) times daily as needed (pain).    Historical Provider, MD  SUMAtriptan (IMITREX) 50 MG tablet Take 1 tablet (50 mg total) by mouth every 2 (two) hours as needed for migraine. May repeat in 2 hours  if headache persists or recurs. 08/26/16   Vivianne Masteriffany S Jenicka Coxe, PA-C    Assessment: 1. Recurrent CP 2. Migraines 3. HTN  Plan: Reassurance that at her age this should not be cardiac but she is very worried. She has fam hx and HTN and new T wave inversion on EKG, will order stress echo. Cont with aggressive RF modification.   Imitrex Cont Reglan prn  Follow up:as scheduled with Dr. Julien NordmannLangeland  The patient was given clear instructions to go to ER or return to  medical center if symptoms don't improve, worsen or new problems develop. The patient verbalized understanding. The patient was told to call to get lab results if they haven't heard anything in the next week.   This note has been created with Education officer, environmentalDragon speech recognition software and smart phrase technology. Any transcriptional errors are unintentional.   Scot Juniffany Jakyle Petrucelli, PA-C 08/26/2016, 2:51 PM

## 2016-09-12 MED FILL — LEVOTHYROXINE 75 MCG TABLET: 75 | 30 days supply | Qty: 30 | Fill #1

## 2016-09-19 ENCOUNTER — Telehealth (HOSPITAL_COMMUNITY): Payer: Self-pay | Admitting: *Deleted

## 2016-09-19 NOTE — Telephone Encounter (Signed)
Patient given detailed instructions per Stress Test Requisition Sheet for test on 09/23/16 at 7:30.Patient Notified to arrive 30 minutes early, and that it is imperative to arrive on time for appointment to keep from having the test rescheduled.  Patient verbalized understanding. Daneil DolinSharon S Brooks

## 2016-09-23 ENCOUNTER — Encounter (HOSPITAL_COMMUNITY): Payer: Self-pay

## 2016-09-23 ENCOUNTER — Telehealth (HOSPITAL_COMMUNITY): Payer: Self-pay | Admitting: *Deleted

## 2016-09-23 ENCOUNTER — Ambulatory Visit (HOSPITAL_COMMUNITY): Payer: Self-pay

## 2016-09-23 ENCOUNTER — Encounter (HOSPITAL_COMMUNITY): Payer: Self-pay | Admitting: *Deleted

## 2016-09-23 ENCOUNTER — Ambulatory Visit (HOSPITAL_COMMUNITY): Payer: Self-pay | Attending: Physician Assistant

## 2016-09-23 NOTE — Telephone Encounter (Signed)
Cancel Stress Echo Due to patient BP being elevated. Patient had stop taking Lopressor for this test. Standing BP 149/115 and sitting 152/115. Due to patient weight size a Lexiscan would be better if clinically indicated and patient could still take lopressor.

## 2016-10-14 ENCOUNTER — Telehealth: Payer: Self-pay | Admitting: Internal Medicine

## 2016-10-14 NOTE — Telephone Encounter (Signed)
Will forward to pcp

## 2016-10-14 NOTE — Telephone Encounter (Signed)
Patient stated needs new referral [facility] for [facility], but due to High blood pressure test was not done. She was [facility] so [facility] could [facility]. Patient will also call [facility] to see if they can go ahead and reschedule.  Patient has FU with [facility] on 10/20/2016 Please follow up with patient

## 2016-10-20 ENCOUNTER — Encounter: Payer: Self-pay | Admitting: Internal Medicine

## 2016-10-20 ENCOUNTER — Ambulatory Visit: Payer: Self-pay | Attending: Internal Medicine | Admitting: Internal Medicine

## 2016-10-20 VITALS — BP 131/83 | HR 76 | Temp 98.0°F | Resp 16 | Wt 372.8 lb

## 2016-10-20 DIAGNOSIS — J45909 Unspecified asthma, uncomplicated: Secondary | ICD-10-CM | POA: Insufficient documentation

## 2016-10-20 DIAGNOSIS — Z79899 Other long term (current) drug therapy: Secondary | ICD-10-CM | POA: Insufficient documentation

## 2016-10-20 DIAGNOSIS — R079 Chest pain, unspecified: Secondary | ICD-10-CM

## 2016-10-20 DIAGNOSIS — R9431 Abnormal electrocardiogram [ECG] [EKG]: Secondary | ICD-10-CM

## 2016-10-20 DIAGNOSIS — I10 Essential (primary) hypertension: Secondary | ICD-10-CM | POA: Insufficient documentation

## 2016-10-20 DIAGNOSIS — E039 Hypothyroidism, unspecified: Secondary | ICD-10-CM

## 2016-10-20 DIAGNOSIS — Z6841 Body Mass Index (BMI) 40.0 and over, adult: Secondary | ICD-10-CM | POA: Insufficient documentation

## 2016-10-20 LAB — BASIC METABOLIC PANEL WITH GFR
BUN: 9 mg/dL (ref 7–25)
CHLORIDE: 106 mmol/L (ref 98–110)
CO2: 25 mmol/L (ref 20–31)
Calcium: 9 mg/dL (ref 8.6–10.2)
Creat: 0.84 mg/dL (ref 0.50–1.10)
GFR, Est African American: >89 mL/min (ref >=60)
GFR, Est Non African American: >89 mL/min (ref >=60)
GLUCOSE: 101 mg/dL — AB (ref 65–99)
POTASSIUM: 3.7 mmol/L (ref 3.5–5.3)
Sodium: 140 mmol/L (ref 135–146)

## 2016-10-20 LAB — TSH: TSH: 0.15 mIU/L — ABNORMAL LOW

## 2016-10-20 LAB — T4, FREE: Free T4: 1.1 ng/dL (ref 0.8–1.8)

## 2016-10-20 MED ORDER — METOPROLOL TARTRATE 50 MG PO TABS: 25.0000 mg | ORAL_TABLET | Freq: Two times a day (BID) | ORAL | 3 refills | Status: DC

## 2016-10-20 MED ORDER — PANTOPRAZOLE SODIUM 40 MG PO TBEC: 40.0000 mg | DELAYED_RELEASE_TABLET | Freq: Every day | ORAL | 3 refills | Status: DC

## 2016-10-20 MED ORDER — HYDROCHLOROTHIAZIDE 25 MG PO TABS: 25.0000 mg | ORAL_TABLET | Freq: Every day | ORAL | 3 refills | Status: DC

## 2016-10-20 MED FILL — ?PANTOPRAZOLE SOD DR 40MG: 40 MG | 30 days supply | Qty: 30 | Fill #0

## 2016-10-20 MED FILL — HYDROCHLOROTHIAZIDE 25 MG T: 25 | 30 days supply | Qty: 30 | Fill #0

## 2016-10-20 MED FILL — METOPROLOL TARTRATE 50 MG T: 50 | 30 days supply | Qty: 30 | Fill #0

## 2016-10-20 NOTE — Patient Instructions (Signed)
QUICK START PATIENT GUIDE TO LCHF/IF LOW CARB HIGH FAT / INTERMITTENT FASTING  Recommend: <50 gram carbohydrate a day for weightloss.  What is this diet and how does it work? o Insulin is a hormone made by your body that allows you to use sugar (glucose) from carbohydrates in the food you eat for energy or to store glucose (as fat) for future use  o Insulin levels need to be lowered in order to utilize our stored energy (fat) o Many struggling with obesity are insulin resistant and have high levels of insulin o This diet works to lower your insulin in two ways o Fasting - allows your insulin levels to naturally decrease  o Avoiding carbohydrates - carbs trigger increase in insulin Low Carb Healthy Fat (LCHF) o Get a free app for your phone, such as MyFitnessPal, to help you track your macronutrients (carbs/protein/fats) and to track your weight and body measurements to see your progress o Set your goal for around 10% carbs/20% protein/70% fat o A good starting goal for amount of net carbs per day is 50 grams (some will aim for 20 grams) o "Net carbs" refers to total grams of carbs minus grams of fiber (as fiber is not typically absorbed). For example, if a food has 5g total carb and 3g fiber, that would be 2g net carbs o Increase healthy fats - eg. olive oil, eggs, nuts, avocado, cheese, butter, coconut, meats, fish o Avoid high carb foods - eg. bread, pasta, potatoes, rice, cookies, soda, juice, anything sugary o Buy full-fat ingredients (avoid low-fat versions, which often have more sugar) o No need to count calories, but pay close attention to grams of carbs on labels Intermittent Fasting (IF) o "Fasting" is going a period of time without eating - it helps to stay busy and well-hydrated o Purpose of fasting is to allow insulin levels to drop as low as possible, allowing your body to switch into fat-burning mode o With this diet there are many approaches to fasting, but 16:8 and 24hr fasts  are commonly used o 16:8 fast, usually 5-7 days a week - Fasting for 16 hours of the day, then eating all meals for the day over course of 8 hours. o 24 hour fast, usually 1-3 days a week - Typically eating one meal a day, then fasting until the next day. Plenty of fluids (and some salt to help you hold onto fluids) are recommended during longer fasts.  o During fasts certain beverages are still acceptable - water, sparkling water, bone broth, black tea or coffee, or tea/coffee with small amount of heavy whipping cream Special note for those on diabetic medications o Discuss your medications with your physician. You may need to hold your medication or adjust to only taking when eating. Diabetics should keep close track of their blood sugars when making any changes to diet/meds, to ensure they are staying within normal limits For more info about LCHF/IF o Watch "Therapeutic Fasting - Solving the Two-Compartment Problem" video by Dr. Jason Fung on YouTube (https://www.youtube.com/watch?v=tIuj-oMN-Fk) for a great intro to these concepts o Read "The Obesity Code" and/or "The Complete Guide to Fasting" by Dr. Jason Fung o Go to www.dietdoctor.com for explanations, recipes, and infographics about foods to eat/avoid o Get a Free smartphone app that helps count carbohydrates  - ie MyKeto EXAMPLES TO GET STARTED Fasting Beverages -water (can add  tsp Pink Himalayan salt once or twice a day to help stay hydrated for longer fasts) -Sparkling water (such as   [place] or similar; avoid any with artificial sweeteners)  -Bone broth (multiple recipes available online or can buy pre-made) -Tea or Coffee (Adding heavy whipping cream or coconut oil to your tea or coffee can be helpful if you find yourself getting too hungry during the fasts. Can also add cinnamon for flavor. Or "bulletproof coffee.") Low Carb Healthy Fat Breakfast (if not fasting) -eggs in butter or olive oil with avocado -omelet with veggies and  cheese  Lunch -hamburger with cheese and avocado wrapped in lettuce (no bun, no ketchup) -meat and cheese wrapped in lettuce (can dip in mustard or olive oil/vinegar/mayo) -salad with meat/cheese/nuts and higher fat dressing (vinaigrette or Ranch, etc) -tuna salad lettuce wrap -taco meat with cheese, sour cream, guacamole, cheese over lettuce  Dinner -steak with herb butter or Barnaise sauce -"Fathead" pizza (uses cheese and almond flour for the dough - several recipes available online) -roasted or grilled chicken with skin on, with low carb sauce (buffalo, garlic butter, alfredo, pesto, etc) -baked salmon with lemon butter -chicken alfredo with zucchini noodles -Indian butter chicken with low carb garlic naan -egg roll in a bowl  Side Dishes -mashed cauliflower (homemade or available in freezer section) -roast vegetables (green veggies that grow above ground rather than root veggies) with butter or cheese -Caprese salad (fresh mozzarella, tomato and basil with olive oil) -homemade low-carb coleslaw Snacks/Desserts (try to avoid unnecessary snacking and sweets in general) -celery or cucumber dipped in guacamole or sour cream dip -cheese and meat slices  -raspberries with whipped cream (can make homemade with no sugar added) -low carb [place] butter cake  AVOID - sugar, diet/regular soda, potatoes, breads, rice, pasta, candy, cookies, cakes, muffins, juice, high carb fruit (bananas, grapes), beer, ketchup, barbeque and other sweet sauces 

## 2016-10-20 NOTE — Progress Notes (Signed)
Lori Mcknight, is a 31 y.o. female  ZOX:[id number]:[id number]  WJX:[id number]  DOB - [birth year]  Chief Complaint  Patient presents with  . Hypothyroidism        Subjective:   Lori Mcknight is a 31 y.o. female here today for a follow up visit., last seen on 06/23/16 by me, and recently saw PA 08/26/16 for atypical chest pains. Of note, PA ordered stress echo, but unsuccessful b/c pt's bp was elevated on day of test and unable to be performed.  She had to hold her antihypertensives night prior.  She describes the chest pain as intermittent, may occur at rest or when she moves her arm/walks/sleeping.  Not associated w/ food.  Pain MEG, but radiates to her left arm or neck as well. She denies n/v/sob/breathing problems during it, says her asthma is well controlled normally.  Denies cp currently.  +sedentary lifestyle, not watching what she eats though, but states "she does not eat a lot."    Per pt, taking all her meds as prescribed.  Patient has No headache, No chest pain, No abdominal pain - No Nausea, No new weakness tingling or numbness, No Cough - SOB.  She is here w/ her mother.  No problems updated.  ALLERGIES: No Known Allergies  PAST MEDICAL HISTORY: Past Medical History:  Diagnosis Date  . Anemia    history of anemia  . Asthma   . Chest pain of uncertain etiology    intermittent left side chest pain  . Chlamydia   . Genital herpes   . Hypertension    no meds since April  . Morbid obesity (HCC)   . Obese   . PCOS (polycystic ovarian syndrome)   . Shortness of breath    on exertion from weight  . Thyroid disease   . Urinary tract infection     MEDICATIONS AT HOME: Prior to Admission medications   Medication Sig Start Date End Date Taking? Authorizing Provider  albuterol (PROVENTIL HFA;VENTOLIN HFA) 108 (90 Base) MCG/ACT inhaler Inhale 2 puffs into the lungs every 6 (six) hours as needed for wheezing or shortness of breath. 01/15/16   Pete Glatterawn T Geanine Vandekamp, MD  beclomethasone  (QVAR) 40 MCG/ACT inhaler Inhale 2 puffs into the lungs 2 (two) times daily as needed (sob). Patient taking differently: Inhale 2 puffs into the lungs 2 (two) times daily as needed (shortness of breath).  06/23/16   Pete Glatterawn T Doyl Bitting, MD  hydrochlorothiazide (HYDRODIURIL) 25 MG tablet Take 1 tablet (25 mg total) by mouth daily. Must have office visit for refills 10/20/16   Pete Glatterawn T Naseer Hearn, MD  ibuprofen (ADVIL,MOTRIN) 200 MG tablet Take 600 mg by mouth every 6 (six) hours as needed for moderate pain.    Historical Provider, MD  levothyroxine (SYNTHROID, LEVOTHROID) 75 MCG tablet Take 1 tablet (75 mcg total) by mouth daily. 06/25/16   Pete Glatterawn T Kaveh Kissinger, MD  metoCLOPramide (REGLAN) 10 MG tablet Take 1 tablet (10 mg total) by mouth every 6 (six) hours as needed for nausea (or headache). Patient not taking: Reported on 10/20/2016 08/16/16   Dione Boozeavid Glick, MD  metoprolol (LOPRESSOR) 50 MG tablet Take 0.5 tablets (25 mg total) by mouth 2 (two) times daily. 10/20/16   Pete Glatterawn T Lorey Pallett, MD  metroNIDAZOLE (FLAGYL) 500 MG tablet Take 500 mg by mouth 2 (two) times daily. 7 day course started 08/15/16    Historical Provider, MD  naproxen sodium (ALEVE) 220 MG tablet Take 220 mg by mouth 2 (two) times daily as needed (pain).  Historical Provider, MD  pantoprazole (PROTONIX) 40 MG tablet Take 1 tablet (40 mg total) by mouth daily. 10/20/16   Pete Glatter, MD  SUMAtriptan (IMITREX) 50 MG tablet Take 1 tablet (50 mg total) by mouth every 2 (two) hours as needed for migraine. May repeat in 2 hours if headache persists or recurs. Patient not taking: Reported on 10/20/2016 08/26/16   Vivianne Master, PA-C     Objective:   Vitals:   10/20/16 1133  BP: 131/83  Pulse: 76  Resp: 16  Temp: 98 F (36.7 C)  TempSrc: Oral  SpO2: 99%  Weight: (!) 372 lb 12.8 oz (169.1 kg)   06/23/16 weight, 369lbs  Exam General appearance : Awake, alert, not in any distress. Speech Clear. Not toxic looking, extreme morbid obese, bmi  60  HEENT: Atraumatic and Normocephalic, pupils equally reactive to light. Neck: supple, no JVD.  Chest:Good air entry bilaterally, no added sounds. CVS: S1 S2 regular, no murmurs/gallups or rubs. Abdomen: Bowel sounds active, Non tender and not distended with no gaurding, rigidity or rebound. Extremities: B/L Lower Ext shows no edema, both legs are warm to touch Neurology: Awake alert, and oriented X 3, CN II-XII grossly intact, Non focal Skin:No Rash  Data Review Lab Results  Component Value Date   HGBA1C 5.5 06/23/2016    Depression screen [facility] 2/9 08/26/2016 06/23/2016 01/15/2016  Decreased Interest 0 (No Data) 1  Down, Depressed, Hopeless 0 0 0  PHQ - 2 Score 0 0 1  Altered sleeping 3 - -  Tired, decreased energy 3 - -  Change in appetite 1 - -  Feeling bad or failure about yourself  0 - -  Trouble concentrating 0 - -  Moving slowly or fidgety/restless 0 - -  Suicidal thoughts 0 - -  PHQ-9 Score 7 - -    ekg 08/16/16 Nsr, 87bpm, possible lae, tw abnmls.  Assessment & Plan   1. Chest pain, unspecified type, atypical. - suspect msk /gi related, provided reassurance as well. - her ekg w/ possible lae/tw abnmls on 08/16/16. - pt attempted to order stress echo Nov, but unable to be completed due to signif elevated bp. - Ambulatory referral to Cardiology  - ?stress test consideration, pt saw Dr Dorian Heckle 4/17 but no stress test recd at time - CBC with Differential - BASIC METABOLIC PANEL WITH GFR - D-dimer, quantitative (not at [facility]) - Brain natriuretic peptide - trial ppi 40qd for silent gi as well - per pt, she has good breast support w/ her current bra straps, but I wonder if this may be worsening the msk pain as well.  2. Hypothyroidism, unspecified type - hold on synthroid renewal til rechk - TSH - T4, Free  3. htn Controlled on bb and hctz  4. Severe obesity, bmi >60 Spent  w/ pt stressing importance of weighloss, since this can certainly be affecting all  her other issues. - low carb/hf/if diet discussed, info provided, recd <50gm carb daily to push weightloss - also recd increase actiivity, low impact sports such as swimming /walking would help her tremendously.  5. Asthma - well controlled, clear lungs    Patient have been counseled extensively about nutrition and exercise  Return in about 4 weeks (around 11/17/2016) for chest pain .  The patient was given clear instructions to go to ER or return to medical center if symptoms don't improve, worsen or new problems develop. The patient verbalized understanding. The patient was told to call to  get lab results if they haven't heard anything in the next week.   This note has been created with Education officer, environmental. Any transcriptional errors are unintentional.   Pete Glatter, MD, MBA/MHA [facility] [place], [place] [phone]   10/20/2016, 12:32 PM

## 2016-10-21 ENCOUNTER — Other Ambulatory Visit: Payer: Self-pay | Admitting: Internal Medicine

## 2016-10-21 LAB — CBC WITH DIFFERENTIAL/PLATELET

## 2016-10-21 LAB — BRAIN NATRIURETIC PEPTIDE

## 2016-10-21 LAB — D-DIMER, QUANTITATIVE: D-Dimer, Quant: 0.28 mcg/mL FEU (ref <0.50)

## 2016-10-21 MED ORDER — LEVOTHYROXINE SODIUM 50 MCG PO TABS
50.0000 ug | ORAL_TABLET | Freq: Every day | ORAL | 3 refills | Status: DC
Start: 2016-10-21 — End: 2017-12-29

## 2016-10-21 MED FILL — ?LEVOTHYROXINE 50 MCG TABLE: 50 | 30 days supply | Qty: 30 | Fill #0

## 2016-10-21 NOTE — Progress Notes (Deleted)
Cardiology Office Note    Date:  10/21/2016   ID:  Lori Mcknight, DOB 17-Dec-1985, MRN [id number]  PCP:  Pete Glatter, MD  Cardiologist:  Dr. Elease Hashimoto  Chief Complaint: CP  History of Present Illness:   Lori Mcknight is a 31 y.o. female with morbid obesity, HTN, POCS, hypothyroidism and asthma presents for chest pain evaluation.   Seen by Dr. Elease Hashimoto for evaluation of intermittent chest pain for past one year 02/04/16. Chest chest pain felt atypical as it was occurring several times a day  - typically at rest. Her main medical issues was her morbid obesity. Echo or myoview would give [place] any useful information due to her body habitus. She was unable to walk on treadmill. Advised smoking cessation and F/u with PCP.    Recently added stress echo for chest pain however cancelled due to elevated blood pressure. Again seen by PCP 10/20/16 for ongoing chest pain. Her chest pain felt MSK or GERD related. D-dimer negative. Adjusted dose of synthroid.   Here today for follow up.   Past Medical History:  Diagnosis Date  . Anemia    history of anemia  . Asthma   . Chest pain of uncertain etiology    intermittent left side chest pain  . Chlamydia   . Genital herpes   . Hypertension    no meds since April  . Morbid obesity (HCC)   . Obese   . PCOS (polycystic ovarian syndrome)   . Shortness of breath    on exertion from weight  . Thyroid disease   . Urinary tract infection     Past Surgical History:  Procedure Laterality Date  . DILATION AND CURETTAGE OF UTERUS    . HYSTEROSCOPY W/D&C N/A 04/06/2014   Procedure: DILATATION AND CURETTAGE /HYSTEROSCOPY ;  Surgeon: Tereso Newcomer, MD;  Location: [facility];  Service: Gynecology;  Laterality: N/A;  . NO PAST SURGERIES    . WISDOM TOOTH EXTRACTION      Current Medications: Prior to Admission medications   Medication Sig Start Date End Date Taking? Authorizing Provider  albuterol (PROVENTIL HFA;VENTOLIN HFA) 108 (90 Base) MCG/ACT inhaler  Inhale 2 puffs into the lungs every 6 (six) hours as needed for wheezing or shortness of breath. 01/15/16   Pete Glatter, MD  beclomethasone (QVAR) 40 MCG/ACT inhaler Inhale 2 puffs into the lungs 2 (two) times daily as needed (sob). Patient taking differently: Inhale 2 puffs into the lungs 2 (two) times daily as needed (shortness of breath).  06/23/16   Pete Glatter, MD  hydrochlorothiazide (HYDRODIURIL) 25 MG tablet Take 1 tablet (25 mg total) by mouth daily. Must have office visit for refills 10/20/16   Pete Glatter, MD  ibuprofen (ADVIL,MOTRIN) 200 MG tablet Take 600 mg by mouth every 6 (six) hours as needed for moderate pain.    Historical Provider, MD  levothyroxine (SYNTHROID, LEVOTHROID) 50 MCG tablet Take 1 tablet (50 mcg total) by mouth daily. 10/21/16   Pete Glatter, MD  metoCLOPramide (REGLAN) 10 MG tablet Take 1 tablet (10 mg total) by mouth every 6 (six) hours as needed for nausea (or headache). Patient not taking: Reported on 10/20/2016 08/16/16   Dione Booze, MD  metoprolol (LOPRESSOR) 50 MG tablet Take 0.5 tablets (25 mg total) by mouth 2 (two) times daily. 10/20/16   Pete Glatter, MD  metroNIDAZOLE (FLAGYL) 500 MG tablet Take 500 mg by mouth 2 (two) times daily. 7 day course started 08/15/16  Historical Provider, MD  naproxen sodium (ALEVE) 220 MG tablet Take 220 mg by mouth 2 (two) times daily as needed (pain).    Historical Provider, MD  pantoprazole (PROTONIX) 40 MG tablet Take 1 tablet (40 mg total) by mouth daily. 10/20/16   Pete Glatterawn T Langeland, MD  SUMAtriptan (IMITREX) 50 MG tablet Take 1 tablet (50 mg total) by mouth every 2 (two) hours as needed for migraine. May repeat in 2 hours if headache persists or recurs. Patient not taking: Reported on 10/20/2016 08/26/16   Vivianne Masteriffany S Noel, PA-C    Allergies:   Patient has no known allergies.   Social History   Social History  . Marital status: Single    Spouse name: N/A  . Number of children: N/A  . Years of education: N/A    Social History Main Topics  . Smoking status: Former Smoker    Quit date: 01/04/2011  . Smokeless tobacco: Never Used     Comment: quit 2009  . Alcohol use 0.0 oz/week     Comment: occasonal   . Drug use: No  . Sexual activity: Yes    Birth control/ protection: None   Other Topics Concern  . Not on file   Social History Narrative  . No narrative on file     Family History:  The patient's family history includes Diabetes in her father and maternal grandfather; Heart disease in her father, maternal grandfather, maternal grandmother, and mother; Sleep apnea in her mother. ***  ROS:   Please see the history of present illness.    ROS All other systems reviewed and are negative.   PHYSICAL EXAM:   VS:  There were no vitals taken for this visit.   GEN: Well nourished, well developed, in no acute distress  HEENT: normal  Neck: no JVD, carotid bruits, or masses Cardiac: ***RRR; no murmurs, rubs, or gallops,no edema  Respiratory:  clear to auscultation bilaterally, normal work of breathing GI: soft, nontender, nondistended, + BS MS: no deformity or atrophy  Skin: warm and dry, no rash Neuro:  Alert and Oriented x 3, Strength and sensation are intact Psych: euthymic mood, full affect  Wt Readings from Last 3 Encounters:  10/20/16 (!) 372 lb 12.8 oz (169.1 kg)  08/26/16 (!) 371 lb (168.3 kg)  08/16/16 (!) 373 lb 4.8 oz (169.3 kg)      Studies/Labs Reviewed:   EKG:  EKG is ordered today.  The ekg ordered today demonstrates ***  Recent Labs: 10/20/2016: Brain Natriuretic Peptide CANCELED; BUN 9; Creat 0.84; Hemoglobin CANCELED; Platelets CANCELED; Potassium 3.7; Sodium 140; TSH 0.15   Lipid Panel No results found for: CHOL, TRIG, HDL, CHOLHDL, VLDL, LDLCALC, LDLDIRECT  Additional studies/ records that were reviewed today include:   As above   ASSESSMENT & PLAN:    1. Chest pain - Atypical  2. HTN    Medication Adjustments/Labs and Tests Ordered: Current  medicines are reviewed at length with the patient today.  Concerns regarding medicines are outlined above.  Medication changes, Labs and Tests ordered today are listed in the Patient Instructions below. There are no Patient Instructions on file for this visit.   Lorelei PontSigned, Jacob Cicero, [place]  10/21/2016 3:58 PM    [facility] [address], [place], [place]  [address] Phone: [phone]; Fax: [phone]

## 2016-10-22 ENCOUNTER — Ambulatory Visit: Payer: Self-pay | Admitting: Physician Assistant

## 2016-10-22 ENCOUNTER — Telehealth: Payer: Self-pay

## 2016-10-22 NOTE — Telephone Encounter (Signed)
Contacted pt to go over lab results pt was not home will call pt again later

## 2016-10-22 NOTE — Progress Notes (Deleted)
Cardiology Office Note    Date:  10/22/2016   ID:  Lori Mcknight, DOB 1986-07-29, MRN [id number]  PCP:  Pete Glatterawn T Langeland, MD  Cardiologist:  Dr. Elease HashimotoNahser  Chief Complaint: Chest pain Pain  History of Present Illness:   Lori Mcknight is a 31 y.o. female is a history of hypertension, hypothyroidism, asthma, morbid obesity, atypical chest pain, tobacco smoking and POCS returns for evaluation of chest pain.  The patient was seen by Dr. Lourena SimmondsNasser once 02/04/16 for evaluation of intermittent chest pain x 1 year. Her chest pain felt atypical that mostly occur at rest. Not felt candidate for core or Myoview due to body habitus. She would not be able to walk on a treadmill. Advised smoking sensation and follow-up with PCP.  Here today for follow up.   Past Medical History:  Diagnosis Date  . Anemia    history of anemia  . Asthma   . Chest pain of uncertain etiology    intermittent left side chest pain  . Chlamydia   . Genital herpes   . Hypertension    no meds since April  . Morbid obesity (HCC)   . Obese   . PCOS (polycystic ovarian syndrome)   . Shortness of breath    on exertion from weight  . Thyroid disease   . Urinary tract infection     Past Surgical History:  Procedure Laterality Date  . DILATION AND CURETTAGE OF UTERUS    . HYSTEROSCOPY W/D&C N/A 04/06/2014   Procedure: DILATATION AND CURETTAGE /HYSTEROSCOPY ;  Surgeon: Tereso NewcomerUgonna A Anyanwu, MD;  Location: [facility];  Service: Gynecology;  Laterality: N/A;  . NO PAST SURGERIES    . WISDOM TOOTH EXTRACTION      Current Medications: Prior to Admission medications   Medication Sig Start Date End Date Taking? Authorizing Provider  albuterol (PROVENTIL HFA;VENTOLIN HFA) 108 (90 Base) MCG/ACT inhaler Inhale 2 puffs into the lungs every 6 (six) hours as needed for wheezing or shortness of breath. 01/15/16   Pete Glatterawn T Langeland, MD  beclomethasone (QVAR) 40 MCG/ACT inhaler Inhale 2 puffs into the lungs 2 (two) times daily as needed  (sob). Patient taking differently: Inhale 2 puffs into the lungs 2 (two) times daily as needed (shortness of breath).  06/23/16   Pete Glatterawn T Langeland, MD  hydrochlorothiazide (HYDRODIURIL) 25 MG tablet Take 1 tablet (25 mg total) by mouth daily. Must have office visit for refills 10/20/16   Pete Glatterawn T Langeland, MD  ibuprofen (ADVIL,MOTRIN) 200 MG tablet Take 600 mg by mouth every 6 (six) hours as needed for moderate pain.    Historical Provider, MD  levothyroxine (SYNTHROID, LEVOTHROID) 50 MCG tablet Take 1 tablet (50 mcg total) by mouth daily. 10/21/16   Pete Glatterawn T Langeland, MD  metoCLOPramide (REGLAN) 10 MG tablet Take 1 tablet (10 mg total) by mouth every 6 (six) hours as needed for nausea (or headache). Patient not taking: Reported on 10/20/2016 08/16/16   Dione Boozeavid Glick, MD  metoprolol (LOPRESSOR) 50 MG tablet Take 0.5 tablets (25 mg total) by mouth 2 (two) times daily. 10/20/16   Pete Glatterawn T Langeland, MD  metroNIDAZOLE (FLAGYL) 500 MG tablet Take 500 mg by mouth 2 (two) times daily. 7 day course started 08/15/16    Historical Provider, MD  naproxen sodium (ALEVE) 220 MG tablet Take 220 mg by mouth 2 (two) times daily as needed (pain).    Historical Provider, MD  pantoprazole (PROTONIX) 40 MG tablet Take 1 tablet (40 mg total) by  mouth daily. 10/20/16   Pete Glatter, MD  SUMAtriptan (IMITREX) 50 MG tablet Take 1 tablet (50 mg total) by mouth every 2 (two) hours as needed for migraine. May repeat in 2 hours if headache persists or recurs. Patient not taking: Reported on 10/20/2016 08/26/16   Vivianne Master, PA-C    Allergies:   Patient has no known allergies.   Social History   Social History  . Marital status: Single    Spouse name: N/A  . Number of children: N/A  . Years of education: N/A   Social History Main Topics  . Smoking status: Former Smoker    Quit date: 01/04/2011  . Smokeless tobacco: Never Used     Comment: quit 2009  . Alcohol use 0.0 oz/week     Comment: occasonal   . Drug use: No  . Sexual  activity: Yes    Birth control/ protection: None   Other Topics Concern  . Not on file   Social History Narrative  . No narrative on file     Family History:  The patient's family history includes Diabetes in her father and maternal grandfather; Heart disease in her father, maternal grandfather, maternal grandmother, and mother; Sleep apnea in her mother. ***  ROS:   Please see the history of present illness.    ROS All other systems reviewed and are negative.   PHYSICAL EXAM:   VS:  There were no vitals taken for this visit.   GEN: Well nourished, well developed, in no acute distress  HEENT: normal  Neck: no JVD, carotid bruits, or masses Cardiac: ***RRR; no murmurs, rubs, or gallops,no edema  Respiratory:  clear to auscultation bilaterally, normal work of breathing GI: soft, nontender, nondistended, + BS MS: no deformity or atrophy  Skin: warm and dry, no rash Neuro:  Alert and Oriented x 3, Strength and sensation are intact Psych: euthymic mood, full affect  Wt Readings from Last 3 Encounters:  10/20/16 (!) 372 lb 12.8 oz (169.1 kg)  08/26/16 (!) 371 lb (168.3 kg)  08/16/16 (!) 373 lb 4.8 oz (169.3 kg)      Studies/Labs Reviewed:   EKG:  EKG is ordered today.  The ekg ordered today demonstrates ***  Recent Labs: 10/20/2016: Brain Natriuretic Peptide CANCELED; BUN 9; Creat 0.84; Hemoglobin CANCELED; Platelets CANCELED; Potassium 3.7; Sodium 140; TSH 0.15   Lipid Panel No results found for: CHOL, TRIG, HDL, CHOLHDL, VLDL, LDLCALC, LDLDIRECT  Additional studies/ records that were reviewed today include:   As above    ASSESSMENT & PLAN:    1. ***    Medication Adjustments/Labs and Tests Ordered: Current medicines are reviewed at length with the patient today.  Concerns regarding medicines are outlined above.  Medication changes, Labs and Tests ordered today are listed in the Patient Instructions below. There are no Patient Instructions on file for this  visit.   Lorelei Pont, [place]  10/22/2016 9:26 AM    [facility] [address] [place], [place], [place]  [address] Phone: [phone]; Fax: [phone]

## 2016-10-23 ENCOUNTER — Ambulatory Visit: Payer: Self-pay | Admitting: Physician Assistant

## 2016-10-29 ENCOUNTER — Ambulatory Visit: Payer: Self-pay

## 2016-11-10 ENCOUNTER — Ambulatory Visit: Payer: Self-pay

## 2016-11-10 ENCOUNTER — Ambulatory Visit: Payer: Self-pay | Admitting: Physician Assistant

## 2016-11-11 NOTE — Progress Notes (Signed)
CARDIOLOGY OFFICE NOTE  Date:  11/12/2016    Lori C Shetley Date of Birth: 23-Aug-1986 Medical Record #[id number]  PCP:  Pete Glatter, MD  Cardiologist:  Nahser  Chief Complaint  Patient presents with  . Chest Pain    Work in visit - seen for Dr. Elease Hashimoto    History of Present Illness: Lori Mcknight is a 31 y.o. female who presents today for a 10 month check. She is seen for Dr. Elease Hashimoto.   She has a history of HTN x 15 years, morbid obesity, asthma, tobacco abuse and atypical chest pain.   Seen back in April of 2017 by Dr. Elease Hashimoto. He did not feel further cardiac testing warranted due to her body habitus and being difficult to image.   Referred back by PCP for chest pain. Had been referred for a stress echo back in November - but BP was elevated and the test was not performed.   Comes in today. Here with her mom. She continues to have chest pain - has been going on for the past one to 2 years. Described as sharp, dull, crampy. Located on the upper left side of the chest. Comes and goes. Not exertional. Will last 5 to 10 minutes. She still smokes - does not quantify. Heart disease runs in her family. BP not controlled. No real exercise. No insurance. Trying to get her [facility] card.   Past Medical History:  Diagnosis Date  . Anemia    history of anemia  . Asthma   . Chest pain of uncertain etiology    intermittent left side chest pain  . Chlamydia   . Genital herpes   . Hypertension    no meds since April  . Morbid obesity (HCC)   . Obese   . PCOS (polycystic ovarian syndrome)   . Shortness of breath    on exertion from weight  . Thyroid disease   . Urinary tract infection     Past Surgical History:  Procedure Laterality Date  . DILATION AND CURETTAGE OF UTERUS    . HYSTEROSCOPY W/D&C N/A 04/06/2014   Procedure: DILATATION AND CURETTAGE /HYSTEROSCOPY ;  Surgeon: Tereso Newcomer, MD;  Location: [facility];  Service: Gynecology;  Laterality: N/A;  . NO PAST SURGERIES      . WISDOM TOOTH EXTRACTION       Medications: Current Outpatient Prescriptions  Medication Sig Dispense Refill  . albuterol (PROVENTIL HFA;VENTOLIN HFA) 108 (90 Base) MCG/ACT inhaler Inhale 2 puffs into the lungs every 6 (six) hours as needed for wheezing or shortness of breath. 1 Inhaler 2  . beclomethasone (QVAR) 40 MCG/ACT inhaler Inhale 2 puffs into the lungs 2 (two) times daily as needed (sob). (Patient taking differently: Inhale 2 puffs into the lungs 2 (two) times daily as needed (shortness of breath). ) 1 Inhaler 12  . hydrochlorothiazide (HYDRODIURIL) 25 MG tablet Take 1 tablet (25 mg total) by mouth daily. Must have office visit for refills 90 tablet 3  . ibuprofen (ADVIL,MOTRIN) 200 MG tablet Take 600 mg by mouth every 6 (six) hours as needed for moderate pain.    Marland Kitchen levothyroxine (SYNTHROID, LEVOTHROID) 50 MCG tablet Take 1 tablet (50 mcg total) by mouth daily. 90 tablet 3  . metoprolol (LOPRESSOR) 50 MG tablet Take 1 tablet (50 mg total) by mouth 2 (two) times daily. 60 tablet 3  . pantoprazole (PROTONIX) 40 MG tablet Take 1 tablet (40 mg total) by mouth daily. 30 tablet 3  .  aspirin EC 81 MG tablet Take 1 tablet (81 mg total) by mouth daily. 90 tablet 3   No current facility-administered medications for this visit.     Allergies: No Known Allergies  Social History: The patient  reports that she quit smoking about 5 years ago. She has never used smokeless tobacco. She reports that she drinks alcohol. She reports that she does not use drugs.   Family History: The patient's family history includes Diabetes in her father and maternal grandfather; Heart disease in her father, maternal grandfather, maternal grandmother, and mother; Sleep apnea in her mother. Dad died from an MI at age 32. Mother is alive and has AF. Grandfather with CHF. Grandmother with tachycardia/AF. Uncle with prior CABG.   Review of Systems: Please see the history of present illness.   Otherwise, the review  of systems is positive for none.   All other systems are reviewed and negative.   Physical Exam: VS:  BP (!) 140/100   Pulse 92   Ht 5\' 6"  (1.676 m)   Wt (!) 367 lb 12.8 oz (166.8 kg)   BMI 59.36 kg/m  .  BMI Body mass index is 59.36 kg/m.  Wt Readings from Last 3 Encounters:  11/12/16 (!) 367 lb 12.8 oz (166.8 kg)  10/20/16 (!) 372 lb 12.8 oz (169.1 kg)  08/26/16 (!) 371 lb (168.3 kg)    General: Morbidly obese. She is alert and in no acute distress.   HEENT: Normal.  Neck: Supple, no JVD, carotid bruits, or masses noted.  Cardiac: Regular rate and rhythm. No palpable chest wall pain. Heart tones are distant. No edema.  Respiratory:  Lungs are clear to auscultation bilaterally with normal work of breathing. Decreased due to body habitus.  GI: Soft and nontender.  MS: No deformity or atrophy. Gait and ROM intact.  Skin: Warm and dry. Color is normal.  Neuro:  Strength and sensation are intact and no gross focal deficits noted.  Psych: Alert, appropriate and with normal affect.   LABORATORY DATA:  EKG:  EKG is ordered today. This demonstrates NSR with inferior ST changes - noted on prior tracings.  Lab Results  Component Value Date   WBC CANCELED 10/20/2016   HGB CANCELED 10/20/2016   HCT CANCELED 10/20/2016   PLT CANCELED 10/20/2016   GLUCOSE 101 (H) 10/20/2016   ALT 99 (H) 01/03/2014   AST 46 (H) 01/03/2014   NA 140 10/20/2016   K 3.7 10/20/2016   CL 106 10/20/2016   CREATININE 0.84 10/20/2016   BUN 9 10/20/2016   CO2 25 10/20/2016   TSH 0.15 (L) 10/20/2016   HGBA1C 5.5 06/23/2016    BNP (last 3 results)  Recent Labs  01/08/16 1852 10/20/16 1205  BNP 16.8 CANCELED    ProBNP (last 3 results) No results for input(s): PROBNP in the last 8760 hours.   Other Studies Reviewed Today:   Assessment/Plan: 1.  Atypical chest pain. - several risk factors. This has been chronic in nature/frequency - over the past 1 to 2 years. She is not felt to be a candidate  for non invasive testing - may just need to proceed with cardiac catheterization. Needs better BP control, stop smoking and try to obtain her [employer] [facility]. Metoprolol increased today. Labs today. Will see back in about a month.   2. Morbid obesity - this makes non invasive testing not useful. This is more than likely her most worrisome feature.   Her CP is very atypical - occurs several  times a day  - typically at rest.  Not related to exertion .  3. Asthma - advised total smoking cessation.      4. HTN - increasing beta blocker today.   5. Abnormal EKG - see #1  Current medicines are reviewed with the patient today.  The patient does not have concerns regarding medicines other than what has been noted above.  The following changes have been made:  See above.  Labs/ tests ordered today include:    Orders Placed This Encounter  Procedures  . Basic metabolic panel  . CBC  . Hemoglobin A1c  . Hepatic function panel  . Lipid panel  . Pro b natriuretic peptide (BNP)  . EKG 12-Lead     Disposition:   FU in about a month with me.    Patient is agreeable to this plan and will call if any problems develop in the interim.   SignedNorma Fredrickson, NP  11/12/2016 10:38 AM  [facility] [address] [place], [place]  [address] Phone: [phone] Fax: [phone]

## 2016-11-12 ENCOUNTER — Encounter: Payer: Self-pay | Admitting: Nurse Practitioner

## 2016-11-12 ENCOUNTER — Ambulatory Visit (INDEPENDENT_AMBULATORY_CARE_PROVIDER_SITE_OTHER): Payer: Self-pay | Admitting: Nurse Practitioner

## 2016-11-12 VITALS — BP 140/100 | HR 92 | Ht 66.0 in | Wt 367.8 lb

## 2016-11-12 DIAGNOSIS — R0600 Dyspnea, unspecified: Secondary | ICD-10-CM

## 2016-11-12 DIAGNOSIS — R0789 Other chest pain: Secondary | ICD-10-CM

## 2016-11-12 MED ORDER — METOPROLOL TARTRATE 50 MG PO TABS: 50.0000 mg | ORAL_TABLET | Freq: Two times a day (BID) | ORAL | 3 refills | Status: DC

## 2016-11-12 MED ORDER — ASPIRIN EC 81 MG PO TBEC: 81.0000 mg | DELAYED_RELEASE_TABLET | Freq: Every day | ORAL | 3 refills | Status: DC

## 2016-11-12 NOTE — Patient Instructions (Signed)
We will be checking the following labs today - BMET, CBC, HPF, Lipids and A1C   Medication Instructions:    Continue with your current medicines. BUT  Add baby aspirin daily  Increase metoprolol to whole tablet twice a day    Testing/Procedures To Be Arranged:  N/A  Follow-Up:   See me in about a month    Other Special Instructions:   No smoking  Work on getting your [employer] card    If you need a refill on your cardiac medications before your next appointment, please call your pharmacy.   Call the [facility] office at [phone] if you have any questions, problems or concerns.

## 2016-11-13 LAB — HEPATIC FUNCTION PANEL
ALT: 34 IU/L — ABNORMAL HIGH (ref 0–32)
AST: 33 IU/L (ref 0–40)
Albumin: 4 g/dL (ref 3.5–5.5)
Alkaline Phosphatase: 93 IU/L (ref 39–117)
Bilirubin Total: 0.6 mg/dL (ref 0.0–1.2)
Bilirubin, Direct: 0.15 mg/dL (ref 0.00–0.40)
Total Protein: 7.8 g/dL (ref 6.0–8.5)

## 2016-11-13 LAB — BASIC METABOLIC PANEL
BUN/Creatinine Ratio: 13 (ref 9–23)
BUN: 12 mg/dL (ref 6–20)
CO2: 27 mmol/L (ref 18–29)
Calcium: 9.4 mg/dL (ref 8.7–10.2)
Chloride: 96 mmol/L (ref 96–106)
Creatinine, Ser: 0.9 mg/dL (ref 0.57–1.00)
GFR calc Af Amer: 99 mL/min/{1.73_m2} (ref >59)
GFR calc non Af Amer: 85 mL/min/{1.73_m2} (ref >59)
Glucose: 120 mg/dL — ABNORMAL HIGH (ref 65–99)
Potassium: 3.5 mmol/L (ref 3.5–5.2)
Sodium: 138 mmol/L (ref 134–144)

## 2016-11-13 LAB — LIPID PANEL
Chol/HDL Ratio: 3.6 ratio units (ref 0.0–4.4)
Cholesterol, Total: 199 mg/dL (ref 100–199)
HDL: 56 mg/dL (ref >39)
LDL Calculated: 126 mg/dL — ABNORMAL HIGH (ref 0–99)
Triglycerides: 87 mg/dL (ref 0–149)
VLDL Cholesterol Cal: 17 mg/dL (ref 5–40)

## 2016-11-13 LAB — CBC
Hematocrit: 43.8 % (ref 34.0–46.6)
Hemoglobin: 14.4 g/dL (ref 11.1–15.9)
MCH: 30.4 pg (ref 26.6–33.0)
MCHC: 32.9 g/dL (ref 31.5–35.7)
MCV: 92 fL (ref 79–97)
Platelets: 283 10*3/uL (ref 150–379)
RBC: 4.74 x10E6/uL (ref 3.77–5.28)
RDW: 13.2 % (ref 12.3–15.4)
WBC: 6.9 10*3/uL (ref 3.4–10.8)

## 2016-11-13 LAB — HEMOGLOBIN A1C
Est. average glucose Bld gHb Est-mCnc: 111 mg/dL
Hgb A1c MFr Bld: 5.5 % (ref 4.8–5.6)

## 2016-11-13 LAB — PRO B NATRIURETIC PEPTIDE: NT-Pro BNP: <5 pg/mL (ref 0–130)

## 2016-11-14 ENCOUNTER — Telehealth: Payer: Self-pay | Admitting: Nurse Practitioner

## 2016-11-14 NOTE — Telephone Encounter (Signed)
New message ° ° ° ° °Returning a call to the nurse to get lab results °

## 2016-11-14 NOTE — Telephone Encounter (Signed)
Patient made aware of results. Patient verbalizes understanding.  

## 2016-11-25 ENCOUNTER — Ambulatory Visit: Payer: Self-pay

## 2016-11-26 ENCOUNTER — Ambulatory Visit: Payer: Self-pay | Attending: Internal Medicine

## 2016-12-08 MED FILL — ?PANTOPRAZOLE SOD DR 40MG: 40 MG | 30 days supply | Qty: 30 | Fill #1

## 2016-12-08 MED FILL — METOPROLOL TARTRATE 50 MG T: 50 | 30 days supply | Qty: 30 | Fill #1

## 2016-12-08 MED FILL — HYDROCHLOROTHIAZIDE 25 MG T: 25 | 30 days supply | Qty: 30 | Fill #1

## 2016-12-08 MED FILL — ?LEVOTHYROXINE 50 MCG TABLE: 50 | 30 days supply | Qty: 30 | Fill #1

## 2016-12-08 NOTE — Progress Notes (Signed)
   CARDIOLOGY OFFICE NOTE  Date:  12/09/2016    Lori Mcknight Date of Birth: 01/27/1986 Medical Record #[id number]  PCP:  Dawn T Langeland, MD  Cardiologist:  Zakhai Meisinger & Nahser  Chief Complaint  Patient presents with  . Chest Pain    Follow up visit - seen for Dr. Nahser    History of Present Illness: Lori Mcknight is a 30 y.o. female who presents today for a follow up visit. She is seen for Dr. Nahser.   She has a history of HTN x 15 years, morbid obesity, asthma, tobacco abuse and chronic atypical chest pain.   Seen back in April of 2017 by Dr. Nahser. He did not feel further cardiac testing warranted due to her body habitus and being too difficult to image.   Referred back by PCP for chest pain. Had been referred for a stress echo back in November - but BP was elevated and the test was not performed. I then saw her here in the office back in January - still smoking, still having chest pain. Trying to get her [employer] card - has no insurance. Opted to consider cardiac catheterization since imaging will be greatly inhibited by her body habitus. Chronically abnormal EKG.   Comes in today. Here with her mom today. Chest pain continues. Weight is up 7 more pounds. She says her chest pain is getting worse, more frequent. Nothing she can do makes it come or go away. Described as a sharp pain - associated with palpitations/dyspnea. She continues to smoke - does not quantify. Tells me how her FH is "bad" for heart issues. Father with early MI. She is taking her medicines.   Past Medical History:  Diagnosis Date  . Anemia    history of anemia  . Asthma   . Chest pain of uncertain etiology    intermittent left side chest pain  . Chlamydia   . Genital herpes   . Hypertension    no meds since April  . Morbid obesity (HCC)   . Obese   . PCOS (polycystic ovarian syndrome)   . Shortness of breath    on exertion from weight  . Thyroid disease   . Urinary tract infection     Past  Surgical History:  Procedure Laterality Date  . DILATION AND CURETTAGE OF UTERUS    . HYSTEROSCOPY W/D&C N/A 04/06/2014   Procedure: DILATATION AND CURETTAGE /HYSTEROSCOPY ;  Surgeon: Ugonna A Anyanwu, MD;  Location: [facility];  Service: Gynecology;  Laterality: N/A;  . NO PAST SURGERIES    . WISDOM TOOTH EXTRACTION       Medications: Current Outpatient Prescriptions  Medication Sig Dispense Refill  . albuterol (PROVENTIL HFA;VENTOLIN HFA) 108 (90 Base) MCG/ACT inhaler Inhale 2 puffs into the lungs every 6 (six) hours as needed for wheezing or shortness of breath. 1 Inhaler 2  . aspirin EC 81 MG tablet Take 1 tablet (81 mg total) by mouth daily. 90 tablet 3  . beclomethasone (QVAR) 40 MCG/ACT inhaler Inhale 2 puffs into the lungs 2 (two) times daily as needed (sob). (Patient taking differently: Inhale 2 puffs into the lungs 2 (two) times daily as needed (shortness of breath). ) 1 Inhaler 12  . hydrochlorothiazide (HYDRODIURIL) 25 MG tablet Take 1 tablet (25 mg total) by mouth daily. Must have office visit for refills 90 tablet 3  . ibuprofen (ADVIL,MOTRIN) 200 MG tablet Take 600 mg by mouth every 6 (six) hours as needed for moderate pain.    .   levothyroxine (SYNTHROID, LEVOTHROID) 50 MCG tablet Take 1 tablet (50 mcg total) by mouth daily. 90 tablet 3  . metoprolol (LOPRESSOR) 50 MG tablet Take 1 tablet (50 mg total) by mouth 2 (two) times daily. 60 tablet 3  . pantoprazole (PROTONIX) 40 MG tablet Take 1 tablet (40 mg total) by mouth daily. 30 tablet 3   No current facility-administered medications for this visit.     Allergies: No Known Allergies  Social History: The patient  reports that she quit smoking about 5 years ago. She has never used smokeless tobacco. She reports that she drinks alcohol. She reports that she does not use drugs.   Family History: The patient's family history includes Diabetes in her father and maternal grandfather; Heart disease in her father, maternal  grandfather, maternal grandmother, and mother; Sleep apnea in her mother.   Review of Systems: Please see the history of present illness.   Otherwise, the review of systems is positive for none.   All other systems are reviewed and negative.   Physical Exam: VS:  BP 110/80   Pulse 93   Ht 5' 6" (1.676 m)   Wt (!) 374 lb 12.8 oz (170 kg)   SpO2 98% Comment: at rest  BMI 60.49 kg/m  .  BMI Body mass index is 60.49 kg/m.  Wt Readings from Last 3 Encounters:  12/09/16 (!) 374 lb 12.8 oz (170 kg)  11/12/16 (!) 367 lb 12.8 oz (166.8 kg)  10/20/16 (!) 372 lb 12.8 oz (169.1 kg)    General: Pleasant. She is morbidly obese. She is alert and in no acute distress.  Her weight is up 7 more pounds. She has multiple piercing's and tattoos.  HEENT: Normal.  Neck: Supple, no JVD, carotid bruits, or masses noted.  Cardiac: Heart tones are distant. No significant edema. Her legs are quite full.  Respiratory:  Breath sounds are distant but with normal work of breathing.  GI: Obese.  MS: No deformity or atrophy. Gait and ROM intact.  Skin: Warm and dry. Color is normal.  Neuro:  Strength and sensation are intact and no gross focal deficits noted.  Psych: Alert, appropriate and with normal affect.   LABORATORY DATA:  EKG:  EKG is ordered today. This demonstrates NSR with nonspecific T wave changes - this is chronic.  Lab Results  Component Value Date   WBC 6.9 11/12/2016   HGB CANCELED 10/20/2016   HCT 43.8 11/12/2016   PLT 283 11/12/2016   GLUCOSE 120 (H) 11/12/2016   CHOL 199 11/12/2016   TRIG 87 11/12/2016   HDL 56 11/12/2016   LDLCALC 126 (H) 11/12/2016   ALT 34 (H) 11/12/2016   AST 33 11/12/2016   NA 138 11/12/2016   K 3.5 11/12/2016   CL 96 11/12/2016   CREATININE 0.90 11/12/2016   BUN 12 11/12/2016   CO2 27 11/12/2016   TSH 0.15 (L) 10/20/2016   HGBA1C 5.5 11/12/2016    BNP (last 3 results)  Recent Labs  01/08/16 1852 10/20/16 1205  BNP 16.8 CANCELED    ProBNP  (last 3 results)  Recent Labs  11/12/16 1042  PROBNP <5     Other Studies Reviewed Today:   Assessment/Plan:  1. Atypical chest pain - with multiple risk factors (+FH, smoking, obesity, HTN). This has been chronic in nature/frequency - over the past 1 to 2 years - now progressively worse with increasing frequency. She is not felt to be a candidate for non invasive testing - referring on   for cardiac catheterization to have definitive diagnosis. But regardless of her findings, discussed that she really needs to make her health a priority - I do not get the feeling that she is motivated to make changes. The patient understands that risks include but are not limited to stroke (1 in 1000), death (1 in 1000), kidney failure [usually temporary] (1 in 500), bleeding (1 in 200), allergic reaction [possibly serious] (1 in 200), and agrees to proceed. Scheduled for Friday with Dr. Jordan.   2. Morbid obesity - this makes non invasive testing not useful. This is more than likely her most worrisome feature. Discussed again today.   3. Asthma - advised total smoking cessation. Does not appear ready to stop.   4. HTN - BP improved on current regimen.   5. Abnormal EKG - see #1   Current medicines are reviewed with the patient today.  The patient does not have concerns regarding medicines other than what has been noted above.  The following changes have been made:  See above.  Labs/ tests ordered today include:    Orders Placed This Encounter  Procedures  . Basic metabolic panel  . CBC  . APTT  . Protime-INR  . EKG 12-Lead     Disposition:   Further disposition pending.   Patient is agreeable to this plan and will call if any problems develop in the interim.   Signed: Aedyn Mckeon, NP  12/09/2016 11:07 AM  [facility] [address] [place], [place]  [address] Phone: [phone] Fax: [phone]        

## 2016-12-09 ENCOUNTER — Encounter: Payer: Self-pay | Admitting: Nurse Practitioner

## 2016-12-09 ENCOUNTER — Other Ambulatory Visit: Payer: Self-pay | Admitting: Nurse Practitioner

## 2016-12-09 ENCOUNTER — Ambulatory Visit (INDEPENDENT_AMBULATORY_CARE_PROVIDER_SITE_OTHER): Payer: Self-pay | Admitting: Nurse Practitioner

## 2016-12-09 VITALS — BP 110/80 | HR 93 | Ht 66.0 in | Wt 374.8 lb

## 2016-12-09 DIAGNOSIS — R0789 Other chest pain: Secondary | ICD-10-CM

## 2016-12-09 LAB — APTT: aPTT: 27 s (ref 24–33)

## 2016-12-09 LAB — BASIC METABOLIC PANEL
BUN/Creatinine Ratio: 13 (ref 9–23)
BUN: 10 mg/dL (ref 6–20)
CO2: 24 mmol/L (ref 18–29)
Calcium: 9.1 mg/dL (ref 8.7–10.2)
Chloride: 101 mmol/L (ref 96–106)
Creatinine, Ser: 0.75 mg/dL (ref 0.57–1.00)
GFR calc Af Amer: 123 mL/min/{1.73_m2} (ref >59)
GFR calc non Af Amer: 107 mL/min/{1.73_m2} (ref >59)
Glucose: 96 mg/dL (ref 65–99)
Potassium: 3.8 mmol/L (ref 3.5–5.2)
Sodium: 141 mmol/L (ref 134–144)

## 2016-12-09 LAB — CBC
Hematocrit: 42.2 % (ref 34.0–46.6)
Hemoglobin: 13.7 g/dL (ref 11.1–15.9)
MCH: 30.6 pg (ref 26.6–33.0)
MCHC: 32.5 g/dL (ref 31.5–35.7)
MCV: 94 fL (ref 79–97)
Platelets: 236 10*3/uL (ref 150–379)
RBC: 4.47 x10E6/uL (ref 3.77–5.28)
RDW: 13.4 % (ref 12.3–15.4)
WBC: 6.9 10*3/uL (ref 3.4–10.8)

## 2016-12-09 LAB — PROTIME-INR
INR: 1 (ref 0.8–1.2)
Prothrombin Time: 10.8 s (ref 9.1–12.0)

## 2016-12-09 NOTE — Patient Instructions (Addendum)
We will be checking the following labs today - BMET, CBC, PT, PTT   Medication Instructions:    Continue with your current medicines.     Testing/Procedures To Be Arranged:  Cardiac catheterization this Friday    Other Special Instructions:   Your provider has recommended a cardiac catherization  You are scheduled for a cardiac catheterization on Friday, March 2nd at [facility] with Dr. [place] or associate.  Please arrive at the [facility] (Main Entrance) at [facility] at [address] [place] -  2nd Floor Short Stay on Friday, March 2nd at [facility].    Special note: Every effort is made to have your procedure done on time.   Please understand that emergencies sometimes delay a scheduled   procedure.  No food or drink after midnight on Thursday.  You may take your morning medications with a sip of water on the day of your procedure.  Medications to HOLD - NONE  Plan for a one night stay -- bring personal belongings.  Bring a current list of your medications and current insurance cards.  You MUST have a responsible person to drive you home. Someone MUST be with you the first 24 hours after you arrive home or your discharge will be delayed. Wear clothes that are easy to get on and off and wear slip on shoes.    Coronary Angiogram A coronary angiogram, also called coronary angiography, is an X-ray procedure used to look at the arteries in the heart. In this procedure, a dye (contrast dye) is injected through a long, hollow tube (catheter). The catheter is about the size of a piece of cooked spaghetti and is inserted through your groin, wrist, or arm. The dye is injected into each artery, and X-rays are then taken to show if there is a blockage in the arteries of your heart.  LET [facility] CARE PROVIDER KNOW ABOUT: Any allergies you have, including allergies to shellfish or contrast dye.  All medicines you are taking, including vitamins, herbs, eye drops, creams, and  over-the-counter medicines.  Previous problems you or members of your family have had with the use of anesthetics.  Any blood disorders you have.  Previous surgeries you have had. History of kidney problems or failure.  Other medical conditions you have.  RISKS AND COMPLICATIONS  Generally, a coronary angiogram is a safe procedure. However, about 1 person out of 1000 can have problems that may include: Allergic reaction to the dye. Bleeding/bruising from the access site or other locations. Kidney injury, especially in people with impaired kidney function. Stroke (rare). Heart attack (rare). Irregular rhythms (rare) Death (rare)  BEFORE THE PROCEDURE  Do not eat or drink anything after midnight the night before the procedure or as directed by your health care provider.  Ask your health care provider about changing or stopping your regular medicines. This is especially important if you are taking diabetes medicines or blood thinners.  PROCEDURE You may be given a medicine to help you relax (sedative) before the procedure. This medicine is given through an intravenous (IV) access tube that is inserted into one of your veins.  The area where the catheter will be inserted will be washed and shaved. This is usually done in the groin but may be done in the fold of your arm (near your elbow) or in the wrist.  A medicine will be given to numb the area where the catheter will be inserted (local anesthetic).  The health care provider will insert  the catheter into an artery. The catheter will be guided by using a special type of X-ray (fluoroscopy) of the blood vessel being examined.  A special dye will then be injected into the catheter, and X-rays will be taken. The dye will help to show where any narrowing or blockages are located in the heart arteries.    AFTER THE PROCEDURE  If the procedure is done through the leg, you will be kept in bed lying flat for several hours. You will be  instructed to not bend or cross your legs. The insertion site will be checked frequently.  The pulse in your feet or wrist will be checked frequently.  Additional blood tests, X-rays, and an electrocardiogram may be done.         If you need a refill on your cardiac medications before your next appointment, please call your pharmacy.   Call the [facility] office at [phone] if you have any questions, problems or concerns.

## 2016-12-12 ENCOUNTER — Encounter (HOSPITAL_COMMUNITY): Payer: Self-pay | Admitting: Cardiology

## 2016-12-12 ENCOUNTER — Ambulatory Visit (HOSPITAL_COMMUNITY)
Admission: RE | Admit: 2016-12-12 | Discharge: 2016-12-12 | Disposition: A | Discharge status: Home / Self Care | Payer: Self-pay | Source: Ambulatory Visit | Attending: Cardiology | Admitting: Cardiology

## 2016-12-12 ENCOUNTER — Encounter (HOSPITAL_COMMUNITY)
Admission: RE | Disposition: A | Discharge status: Home / Self Care | Payer: Self-pay | Source: Ambulatory Visit | Attending: Cardiology

## 2016-12-12 DIAGNOSIS — Z8249 Family history of ischemic heart disease and other diseases of the circulatory system: Secondary | ICD-10-CM | POA: Insufficient documentation

## 2016-12-12 DIAGNOSIS — R9431 Abnormal electrocardiogram [ECG] [EKG]: Secondary | ICD-10-CM | POA: Insufficient documentation

## 2016-12-12 DIAGNOSIS — F17210 Nicotine dependence, cigarettes, uncomplicated: Secondary | ICD-10-CM | POA: Insufficient documentation

## 2016-12-12 DIAGNOSIS — E079 Disorder of thyroid, unspecified: Secondary | ICD-10-CM | POA: Insufficient documentation

## 2016-12-12 DIAGNOSIS — I10 Essential (primary) hypertension: Secondary | ICD-10-CM | POA: Insufficient documentation

## 2016-12-12 DIAGNOSIS — Z7982 Long term (current) use of aspirin: Secondary | ICD-10-CM | POA: Insufficient documentation

## 2016-12-12 DIAGNOSIS — E66813 Obesity, class 3: Secondary | ICD-10-CM | POA: Diagnosis present

## 2016-12-12 DIAGNOSIS — Z833 Family history of diabetes mellitus: Secondary | ICD-10-CM | POA: Insufficient documentation

## 2016-12-12 DIAGNOSIS — R0789 Other chest pain: Secondary | ICD-10-CM | POA: Insufficient documentation

## 2016-12-12 DIAGNOSIS — Z6841 Body Mass Index (BMI) 40.0 and over, adult: Secondary | ICD-10-CM | POA: Insufficient documentation

## 2016-12-12 DIAGNOSIS — E282 Polycystic ovarian syndrome: Secondary | ICD-10-CM | POA: Insufficient documentation

## 2016-12-12 DIAGNOSIS — J45909 Unspecified asthma, uncomplicated: Secondary | ICD-10-CM | POA: Insufficient documentation

## 2016-12-12 DIAGNOSIS — G8929 Other chronic pain: Secondary | ICD-10-CM | POA: Insufficient documentation

## 2016-12-12 DIAGNOSIS — R079 Chest pain, unspecified: Secondary | ICD-10-CM | POA: Diagnosis present

## 2016-12-12 HISTORY — PX: LEFT HEART CATH AND CORONARY ANGIOGRAPHY: CATH118249

## 2016-12-12 LAB — URINALYSIS, COMPLETE (UACMP) WITH MICROSCOPIC
Bilirubin Urine: NEGATIVE
GLUCOSE, UA: NEGATIVE mg/dL
HGB URINE DIPSTICK: NEGATIVE
Ketones, ur: NEGATIVE mg/dL
Leukocytes, UA: NEGATIVE
Nitrite: NEGATIVE
PH: 6 (ref 5.0–8.0)
Protein, ur: NEGATIVE mg/dL
SPECIFIC GRAVITY, URINE: 1.016 (ref 1.005–1.030)
SQUAMOUS EPITHELIAL / LPF: NONE SEEN

## 2016-12-12 LAB — PREGNANCY, URINE: Preg Test, Ur: NEGATIVE

## 2016-12-12 SURGERY — LEFT HEART CATH AND CORONARY ANGIOGRAPHY

## 2016-12-12 MED ORDER — FENTANYL CITRATE (PF) 100 MCG/2ML IJ SOLN
INTRAMUSCULAR | Status: DC | PRN
  Administered 2016-12-12: 25 ug via INTRAVENOUS
  Administered 2016-12-12: 50 ug via INTRAVENOUS

## 2016-12-12 MED ORDER — VERAPAMIL HCL 2.5 MG/ML IV SOLN
INTRAVENOUS | Status: AC
  Filled 2016-12-12: qty 2

## 2016-12-12 MED ORDER — HEPARIN (PORCINE) IN NACL 2-0.9 UNIT/ML-% IJ SOLN
INTRAMUSCULAR | Status: AC
  Filled 2016-12-12: qty 1500

## 2016-12-12 MED ORDER — SODIUM CHLORIDE 0.9% FLUSH: 3.0000 mL | Freq: Two times a day (BID) | INTRAVENOUS | Status: DC

## 2016-12-12 MED ORDER — LIDOCAINE HCL (PF) 1 % IJ SOLN
INTRAMUSCULAR | Status: DC | PRN
  Administered 2016-12-12: 2 mL

## 2016-12-12 MED ORDER — HEPARIN SODIUM (PORCINE) 1000 UNIT/ML IJ SOLN
INTRAMUSCULAR | Status: DC | PRN
  Administered 2016-12-12: 6000 [IU] via INTRAVENOUS

## 2016-12-12 MED ORDER — FENTANYL CITRATE (PF) 100 MCG/2ML IJ SOLN
INTRAMUSCULAR | Status: AC
  Filled 2016-12-12: qty 2

## 2016-12-12 MED ORDER — HEPARIN SODIUM (PORCINE) 1000 UNIT/ML IJ SOLN
INTRAMUSCULAR | Status: AC
  Filled 2016-12-12: qty 1

## 2016-12-12 MED ORDER — SODIUM CHLORIDE 0.9 % IV SOLN: 250.0000 mL | INTRAVENOUS | Status: DC | PRN

## 2016-12-12 MED ORDER — IOPAMIDOL (ISOVUE-370) INJECTION 76%
INTRAVENOUS | Status: DC | PRN
  Administered 2016-12-12: 50 mL

## 2016-12-12 MED ORDER — SODIUM CHLORIDE 0.9% FLUSH: 3.0000 mL | INTRAVENOUS | Status: DC | PRN

## 2016-12-12 MED ORDER — MIDAZOLAM HCL 2 MG/2ML IJ SOLN
INTRAMUSCULAR | Status: DC | PRN
  Administered 2016-12-12: 1 mg via INTRAVENOUS

## 2016-12-12 MED ORDER — LIDOCAINE HCL (PF) 1 % IJ SOLN
INTRAMUSCULAR | Status: AC
  Filled 2016-12-12: qty 30

## 2016-12-12 MED ORDER — DIAZEPAM 5 MG PO TABS
ORAL_TABLET | ORAL | Status: AC
  Administered 2016-12-12: 10 mg via ORAL
  Filled 2016-12-12: qty 2

## 2016-12-12 MED ORDER — ASPIRIN 81 MG PO CHEW
81.0000 mg | CHEWABLE_TABLET | ORAL | Status: AC
  Administered 2016-12-12: 81 mg via ORAL

## 2016-12-12 MED ORDER — FENTANYL CITRATE (PF) 100 MCG/2ML IJ SOLN
INTRAMUSCULAR | Status: AC
  Administered 2016-12-12: 25 ug via INTRAVENOUS
  Filled 2016-12-12: qty 2

## 2016-12-12 MED ORDER — FENTANYL CITRATE (PF) 100 MCG/2ML IJ SOLN
25.0000 ug | Freq: Once | INTRAMUSCULAR | Status: AC
  Administered 2016-12-12: 25 ug via INTRAVENOUS

## 2016-12-12 MED ORDER — HEPARIN (PORCINE) IN NACL 2-0.9 UNIT/ML-% IJ SOLN
INTRAMUSCULAR | Status: DC | PRN
  Administered 2016-12-12: 10 mL via INTRA_ARTERIAL

## 2016-12-12 MED ORDER — SODIUM CHLORIDE 0.9 % WEIGHT BASED INFUSION: 1.0000 mL/kg/h | INTRAVENOUS | Status: AC

## 2016-12-12 MED ORDER — MIDAZOLAM HCL 2 MG/2ML IJ SOLN
INTRAMUSCULAR | Status: AC
  Filled 2016-12-12: qty 2

## 2016-12-12 MED ORDER — SODIUM CHLORIDE 0.9 % IV SOLN
INTRAVENOUS | Status: DC
  Administered 2016-12-12: 08:00:00 via INTRAVENOUS

## 2016-12-12 MED ORDER — ASPIRIN 81 MG PO CHEW
CHEWABLE_TABLET | ORAL | Status: AC
  Administered 2016-12-12: 81 mg via ORAL
  Filled 2016-12-12: qty 1

## 2016-12-12 MED ORDER — DIAZEPAM 5 MG PO TABS
10.0000 mg | ORAL_TABLET | ORAL | Status: AC
  Administered 2016-12-12: 10 mg via ORAL

## 2016-12-12 SURGICAL SUPPLY — 15 items
CATH 5FR JL3.5 JR4 ANG PIG MP (CATHETERS) ×1 IMPLANT
CATH EXPO 5F IM (CATHETERS) ×1 IMPLANT
CATH INFINITI 4FR 3 DRC (CATHETERS) ×1 IMPLANT
CATH LAUNCHER 5F RADR (CATHETERS) IMPLANT
CATHETER LAUNCHER 5F RADR (CATHETERS) ×2
DEVICE RAD COMP TR BAND LRG (VASCULAR PRODUCTS) ×1 IMPLANT
GLIDESHEATH SLEND SS 6F .021 (SHEATH) ×1 IMPLANT
GUIDEWIRE AMPLATZER 1.5JX260 (WIRE) ×1 IMPLANT
GUIDEWIRE INQWIRE 1.5J.035X260 (WIRE) IMPLANT
INQWIRE 1.5J .035X260CM (WIRE) ×2
KIT HEART LEFT (KITS) ×2 IMPLANT
PACK CARDIAC CATHETERIZATION (CUSTOM PROCEDURE TRAY) ×2 IMPLANT
TRANSDUCER W/STOPCOCK (MISCELLANEOUS) ×2 IMPLANT
TUBING CIL FLEX 10 FLL-RA (TUBING) ×2 IMPLANT
WIRE HI TORQ VERSACORE-J 145CM (WIRE) ×1 IMPLANT

## 2016-12-12 NOTE — Discharge Instructions (Signed)
Radial Site Care °Refer to this sheet in the next few weeks. These instructions provide you with information about caring for yourself after your procedure. Your health care provider may also give you more specific instructions. Your treatment has been planned according to current medical practices, but problems sometimes occur. Call your health care provider if you have any problems or questions after your procedure. °What can I expect after the procedure? °After your procedure, it is typical to have the following: °· Bruising at the radial site that usually fades within 1-2 weeks. °· Blood collecting in the tissue (hematoma) that may be painful to the touch. It should usually decrease in size and tenderness within 1-2 weeks. °Follow these instructions at home: °· Take medicines only as directed by your health care provider. °· You may shower 24-48 hours after the procedure or as directed by your health care provider. Remove the bandage (dressing) and gently wash the site with plain soap and water. Pat the area dry with a clean towel. Do not rub the site, because this may cause bleeding. °· Do not take baths, swim, or use a hot tub until your health care provider approves. °· Check your insertion site every day for redness, swelling, or drainage. °· Do not apply powder or lotion to the site. °· Do not flex or bend the affected arm for 24 hours or as directed by your health care provider. °· Do not push or pull heavy objects with the affected arm for 24 hours or as directed by your health care provider. °· Do not lift over 10 lb (4.5 kg) for 5 days after your procedure or as directed by your health care provider. °· Ask your health care provider when it is okay to: °¨ Return to work or school. °¨ Resume usual physical activities or sports. °¨ Resume sexual activity. °· Do not drive home if you are discharged the same day as the procedure. Have someone else drive you. °· You may drive 24 hours after the procedure  unless otherwise instructed by your health care provider. °· Do not operate machinery or power tools for 24 hours after the procedure. °· If your procedure was done as an outpatient procedure, which means that you went home the same day as your procedure, a responsible adult should be with you for the first 24 hours after you arrive home. °· Keep all follow-up visits as directed by your health care provider. This is important. °Contact a health care provider if: °· You have a fever. °· You have chills. °· You have increased bleeding from the radial site. Hold pressure on the site. °Get help right away if: °· You have unusual pain at the radial site. °· You have redness, warmth, or swelling at the radial site. °· You have drainage (other than a small amount of blood on the dressing) from the radial site. °· The radial site is bleeding, and the bleeding does not stop after 30 minutes of holding steady pressure on the site. °· Your arm or hand becomes pale, cool, tingly, or numb. °This information is not intended to replace advice given to you by your health care provider. Make sure you discuss any questions you have with your health care provider. °Document Released: 11/01/2010 Document Revised: 03/06/2016 Document Reviewed: 04/17/2014 °Elsevier Interactive Patient Education © 2017 [employer] ° °

## 2016-12-12 NOTE — H&P (View-Only) (Signed)
CARDIOLOGY OFFICE NOTE  Date:  12/09/2016    Lori Mcknight Date of Birth: [birth year] Medical Record #[id number]  PCP:  Pete Glatter, MD  Cardiologist:  Tyrone Sage Nahser  Chief Complaint  Patient presents with  . Chest Pain    Follow up visit - seen for Dr. Elease Hashimoto    History of Present Illness: Lori Mcknight is a 31 y.o. female who presents today for a follow up visit. She is seen for Dr. Elease Hashimoto.   She has a history of HTN x 15 years, morbid obesity, asthma, tobacco abuse and chronic atypical chest pain.   Seen back in April of 2017 by Dr. Elease Hashimoto. He did not feel further cardiac testing warranted due to her body habitus and being too difficult to image.   Referred back by PCP for chest pain. Had been referred for a stress echo back in November - but BP was elevated and the test was not performed. I then saw her here in the office back in January - still smoking, still having chest pain. Trying to get her [employer] card - has no insurance. Opted to consider cardiac catheterization since imaging will be greatly inhibited by her body habitus. Chronically abnormal EKG.   Comes in today. Here with her mom today. Chest pain continues. Weight is up 7 more pounds. She says her chest pain is getting worse, more frequent. Nothing she can do makes it come or go away. Described as a sharp pain - associated with palpitations/dyspnea. She continues to smoke - does not quantify. Tells me how her FH is "bad" for heart issues. Father with early MI. She is taking her medicines.   Past Medical History:  Diagnosis Date  . Anemia    history of anemia  . Asthma   . Chest pain of uncertain etiology    intermittent left side chest pain  . Chlamydia   . Genital herpes   . Hypertension    no meds since April  . Morbid obesity (HCC)   . Obese   . PCOS (polycystic ovarian syndrome)   . Shortness of breath    on exertion from weight  . Thyroid disease   . Urinary tract infection     Past  Surgical History:  Procedure Laterality Date  . DILATION AND CURETTAGE OF UTERUS    . HYSTEROSCOPY W/D&C N/A 04/06/2014   Procedure: DILATATION AND CURETTAGE /HYSTEROSCOPY ;  Surgeon: Tereso Newcomer, MD;  Location: [facility];  Service: Gynecology;  Laterality: N/A;  . NO PAST SURGERIES    . WISDOM TOOTH EXTRACTION       Medications: Current Outpatient Prescriptions  Medication Sig Dispense Refill  . albuterol (PROVENTIL HFA;VENTOLIN HFA) 108 (90 Base) MCG/ACT inhaler Inhale 2 puffs into the lungs every 6 (six) hours as needed for wheezing or shortness of breath. 1 Inhaler 2  . aspirin EC 81 MG tablet Take 1 tablet (81 mg total) by mouth daily. 90 tablet 3  . beclomethasone (QVAR) 40 MCG/ACT inhaler Inhale 2 puffs into the lungs 2 (two) times daily as needed (sob). (Patient taking differently: Inhale 2 puffs into the lungs 2 (two) times daily as needed (shortness of breath). ) 1 Inhaler 12  . hydrochlorothiazide (HYDRODIURIL) 25 MG tablet Take 1 tablet (25 mg total) by mouth daily. Must have office visit for refills 90 tablet 3  . ibuprofen (ADVIL,MOTRIN) 200 MG tablet Take 600 mg by mouth every 6 (six) hours as needed for moderate pain.    Marland Kitchen  levothyroxine (SYNTHROID, LEVOTHROID) 50 MCG tablet Take 1 tablet (50 mcg total) by mouth daily. 90 tablet 3  . metoprolol (LOPRESSOR) 50 MG tablet Take 1 tablet (50 mg total) by mouth 2 (two) times daily. 60 tablet 3  . pantoprazole (PROTONIX) 40 MG tablet Take 1 tablet (40 mg total) by mouth daily. 30 tablet 3   No current facility-administered medications for this visit.     Allergies: No Known Allergies  Social History: The patient  reports that she quit smoking about 5 years ago. She has never used smokeless tobacco. She reports that she drinks alcohol. She reports that she does not use drugs.   Family History: The patient's family history includes Diabetes in her father and maternal grandfather; Heart disease in her father, maternal  grandfather, maternal grandmother, and mother; Sleep apnea in her mother.   Review of Systems: Please see the history of present illness.   Otherwise, the review of systems is positive for none.   All other systems are reviewed and negative.   Physical Exam: VS:  BP 110/80   Pulse 93   Ht 5\' 6"  (1.676 m)   Wt (!) 374 lb 12.8 oz (170 kg)   SpO2 98% Comment: at rest  BMI 60.49 kg/m  .  BMI Body mass index is 60.49 kg/m.  Wt Readings from Last 3 Encounters:  12/09/16 (!) 374 lb 12.8 oz (170 kg)  11/12/16 (!) 367 lb 12.8 oz (166.8 kg)  10/20/16 (!) 372 lb 12.8 oz (169.1 kg)    General: Pleasant. She is morbidly obese. She is alert and in no acute distress.  Her weight is up 7 more pounds. She has multiple piercing's and tattoos.  HEENT: Normal.  Neck: Supple, no JVD, carotid bruits, or masses noted.  Cardiac: Heart tones are distant. No significant edema. Her legs are quite full.  Respiratory:  Breath sounds are distant but with normal work of breathing.  GI: Obese.  MS: No deformity or atrophy. Gait and ROM intact.  Skin: Warm and dry. Color is normal.  Neuro:  Strength and sensation are intact and no gross focal deficits noted.  Psych: Alert, appropriate and with normal affect.   LABORATORY DATA:  EKG:  EKG is ordered today. This demonstrates NSR with nonspecific T wave changes - this is chronic.  Lab Results  Component Value Date   WBC 6.9 11/12/2016   HGB CANCELED 10/20/2016   HCT 43.8 11/12/2016   PLT 283 11/12/2016   GLUCOSE 120 (H) 11/12/2016   CHOL 199 11/12/2016   TRIG 87 11/12/2016   HDL 56 11/12/2016   LDLCALC 126 (H) 11/12/2016   ALT 34 (H) 11/12/2016   AST 33 11/12/2016   NA 138 11/12/2016   K 3.5 11/12/2016   CL 96 11/12/2016   CREATININE 0.90 11/12/2016   BUN 12 11/12/2016   CO2 27 11/12/2016   TSH 0.15 (L) 10/20/2016   HGBA1C 5.5 11/12/2016    BNP (last 3 results)  Recent Labs  01/08/16 1852 10/20/16 1205  BNP 16.8 CANCELED    ProBNP  (last 3 results)  Recent Labs  11/12/16 1042  PROBNP <5     Other Studies Reviewed Today:   Assessment/Plan:  1. Atypical chest pain - with multiple risk factors (+FH, smoking, obesity, HTN). This has been chronic in nature/frequency - over the past 1 to 2 years - now progressively worse with increasing frequency. She is not felt to be a candidate for non invasive testing - referring on  for cardiac catheterization to have definitive diagnosis. But regardless of her findings, discussed that she really needs to make her health a priority - I do not get the feeling that she is motivated to make changes. The patient understands that risks include but are not limited to stroke (1 in 1000), death (1 in 1000), kidney failure [usually temporary] (1 in 500), bleeding (1 in 200), allergic reaction [possibly serious] (1 in 200), and agrees to proceed. Scheduled for Friday with Dr. SwazilandJordan.   2. Morbid obesity - this makes non invasive testing not useful. This is more than likely her most worrisome feature. Discussed again today.   3. Asthma - advised total smoking cessation. Does not appear ready to stop.   4. HTN - BP improved on current regimen.   5. Abnormal EKG - see #1   Current medicines are reviewed with the patient today.  The patient does not have concerns regarding medicines other than what has been noted above.  The following changes have been made:  See above.  Labs/ tests ordered today include:    Orders Placed This Encounter  Procedures  . Basic metabolic panel  . CBC  . APTT  . Protime-INR  . EKG 12-Lead     Disposition:   Further disposition pending.   Patient is agreeable to this plan and will call if any problems develop in the interim.   SignedNorma Fredrickson: Kaho Selle, NP  12/09/2016 11:07 AM  [facility] [address] [place], [place]  [address] Phone: [phone] Fax: [phone]

## 2016-12-12 NOTE — Interval H&P Note (Signed)
History and Physical Interval Note:  12/12/2016 9:43 AM  Lori Mcknight  has presented today for surgery, with the diagnosis of cp  The various methods of treatment have been discussed with the patient and family. After consideration of risks, benefits and other options for treatment, the patient has consented to  Procedure(s): Left Heart Cath and Coronary Angiography (N/A) as a surgical intervention .  The patient's history has been reviewed, patient examined, no change in status, stable for surgery.  I have reviewed the patient's chart and labs.  Questions were answered to the patient's satisfaction.   Cath Lab Visit (complete for each Cath Lab visit)  Clinical Evaluation Leading to the Procedure:   ACS: No.  Non-ACS:    Anginal Classification: CCS III  Anti-ischemic medical therapy: Minimal Therapy (1 class of medications)  Non-Invasive Test Results: No non-invasive testing performed  Prior CABG: No previous CABG        Theron Aristaeter [facility] AreaJordanMD,FACC 12/12/2016 9:43 AM

## 2016-12-12 NOTE — Progress Notes (Signed)
Pt c/o nausea. States she thinks valium made her nauseated.  Lindsey,PA paged

## 2016-12-15 MED FILL — Heparin Sodium (Porcine) 2 Unit/ML in Sodium Chloride 0.9%: INTRAMUSCULAR | Qty: 1000 | Status: AC

## 2017-01-06 ENCOUNTER — Emergency Department (HOSPITAL_COMMUNITY): Payer: Self-pay

## 2017-01-06 ENCOUNTER — Emergency Department (HOSPITAL_COMMUNITY)
Admission: EM | Admit: 2017-01-06 | Discharge: 2017-01-06 | Disposition: A | Discharge status: Home / Self Care | Payer: Self-pay | Attending: Emergency Medicine | Admitting: Emergency Medicine

## 2017-01-06 ENCOUNTER — Encounter (HOSPITAL_COMMUNITY): Payer: Self-pay

## 2017-01-06 DIAGNOSIS — J4521 Mild intermittent asthma with (acute) exacerbation: Secondary | ICD-10-CM | POA: Insufficient documentation

## 2017-01-06 DIAGNOSIS — Z87891 Personal history of nicotine dependence: Secondary | ICD-10-CM | POA: Insufficient documentation

## 2017-01-06 DIAGNOSIS — Z79899 Other long term (current) drug therapy: Secondary | ICD-10-CM | POA: Insufficient documentation

## 2017-01-06 DIAGNOSIS — I10 Essential (primary) hypertension: Secondary | ICD-10-CM | POA: Insufficient documentation

## 2017-01-06 DIAGNOSIS — Z7982 Long term (current) use of aspirin: Secondary | ICD-10-CM | POA: Insufficient documentation

## 2017-01-06 DIAGNOSIS — R05 Cough: Secondary | ICD-10-CM

## 2017-01-06 DIAGNOSIS — R059 Cough, unspecified: Secondary | ICD-10-CM

## 2017-01-06 MED ORDER — ALBUTEROL SULFATE (2.5 MG/3ML) 0.083% IN NEBU
5.0000 mg | INHALATION_SOLUTION | Freq: Once | RESPIRATORY_TRACT | Status: AC
  Administered 2017-01-06: 5 mg via RESPIRATORY_TRACT
  Filled 2017-01-06: qty 6

## 2017-01-06 MED ORDER — IPRATROPIUM BROMIDE 0.02 % IN SOLN
0.5000 mg | Freq: Once | RESPIRATORY_TRACT | Status: AC
  Administered 2017-01-06: 0.5 mg via RESPIRATORY_TRACT
  Filled 2017-01-06: qty 2.5

## 2017-01-06 MED ORDER — ALBUTEROL SULFATE (2.5 MG/3ML) 0.083% IN NEBU: 5.0000 mg | INHALATION_SOLUTION | Freq: Four times a day (QID) | RESPIRATORY_TRACT | 0 refills | Status: DC | PRN

## 2017-01-06 MED ORDER — PREDNISONE 20 MG PO TABS
60.0000 mg | ORAL_TABLET | Freq: Once | ORAL | Status: AC
  Administered 2017-01-06: 60 mg via ORAL
  Filled 2017-01-06: qty 3

## 2017-01-06 MED ORDER — PREDNISONE 20 MG PO TABS: ORAL_TABLET | ORAL | 0 refills | Status: DC

## 2017-01-06 NOTE — ED Triage Notes (Signed)
Patient state she has had a productive cough with yellow/green sputum. Patient has been using her home nebulizer with no relief. Patient denies fever.

## 2017-01-06 NOTE — Discharge Instructions (Signed)
Take the prescribed medication as directed.  Can do treatments every 4-6 hours as needed for wheezing/shortness of breath. Follow-up with your primary care doctor. Return to the ED for new or worsening symptoms.

## 2017-01-06 NOTE — ED Provider Notes (Signed)
[facility]-EMERGENCY DEPT Provider Note   CSN: [id number] Arrival date & time: 01/06/17  0947     History   Chief Complaint Chief Complaint  Patient presents with  . Shortness of Breath  . Cough    HPI Lori Mcknight is a 31 y.o. female.  The history is provided by the patient and medical records.  Shortness of Breath  Associated symptoms include cough.  Cough  Associated symptoms include shortness of breath.    31 year old female with history of anemia, asthma, hypertension, morbid obesity, PCO S, thyroid disease, presenting to the ED for shortness of breath and productive cough.  Patient reports this is been ongoing for about 3 weeks now. Cough is intermittently productive with green/yellow sputum. States she had a sore throat for about a day, however this is not been an ongoing issue. States she gets short of breath with heavy coughing fits and has some chest tightness. She denies any diaphoresis, nausea, vomiting, numbness, or weakness. She has no known cardiac history. States she does have prominent asthma flares due to weather change area and she has been using home nebulizer treatments, however she realizes this morning the solution was from 2016.  Past Medical History:  Diagnosis Date  . Anemia    history of anemia  . Asthma   . Chest pain of uncertain etiology    intermittent left side chest pain  . Chlamydia   . Genital herpes   . Hypertension    no meds since April  . Morbid obesity (HCC)   . Obese   . PCOS (polycystic ovarian syndrome)   . Shortness of breath    on exertion from weight  . Thyroid disease   . Urinary tract infection     Patient Active Problem List   Diagnosis Date Noted  . Migraines 08/26/2016  . Chest pain 02/04/2016  . Abnormal uterine bleeding (AUB) 01/09/2014  . Endometrial thickening on ultra sound 12/02/2013  . Hypertension 10/21/2013  . PCOS (polycystic ovarian syndrome) 11/06/2011  . Obesity, Class III, BMI 40-49.9 (morbid obesity)  (HCC) 11/06/2011  . Infertility, female 11/06/2011  . Pelvic pain in female 08/01/2011    Past Surgical History:  Procedure Laterality Date  . DILATION AND CURETTAGE OF UTERUS    . HYSTEROSCOPY W/D&C N/A 04/06/2014   Procedure: DILATATION AND CURETTAGE /HYSTEROSCOPY ;  Surgeon: Tereso NewcomerUgonna A Anyanwu, MD;  Location: [facility];  Service: Gynecology;  Laterality: N/A;  . LEFT HEART CATH AND CORONARY ANGIOGRAPHY N/A 12/12/2016   Procedure: Left Heart Cath and Coronary Angiography;  Surgeon: Peter M SwazilandJordan, MD;  [facility];  Service: Cardiovascular;  Laterality: N/A;  . NO PAST SURGERIES    . WISDOM TOOTH EXTRACTION      OB History    Gravida Para Term Preterm AB Living   0             SAB TAB Ectopic Multiple Live Births                   Home Medications    Prior to Admission medications   Medication Sig Start Date End Date Taking? Authorizing Provider  albuterol (PROVENTIL HFA;VENTOLIN HFA) 108 (90 Base) MCG/ACT inhaler Inhale 2 puffs into the lungs every 6 (six) hours as needed for wheezing or shortness of breath. 01/15/16   Pete Glatterawn T Langeland, MD  aspirin EC 81 MG tablet Take 1 tablet (81 mg total) by mouth daily. 11/12/16   Rosalio MacadamiaLori C Gerhardt, NP  beclomethasone (QVAR) 40 MCG/ACT inhaler Inhale 2 puffs into the lungs 2 (two) times daily as needed (sob). Patient taking differently: Inhale 2 puffs into the lungs 2 (two) times daily as needed (shortness of breath).  06/23/16   Pete Glatter, MD  hydrochlorothiazide (HYDRODIURIL) 25 MG tablet Take 1 tablet (25 mg total) by mouth daily. Must have office visit for refills Patient taking differently: Take 50 mg by mouth daily. Must have office visit for refills 10/20/16   Pete Glatter, MD  levothyroxine (SYNTHROID, LEVOTHROID) 50 MCG tablet Take 1 tablet (50 mcg total) by mouth daily. 10/21/16   Pete Glatter, MD  metoprolol (LOPRESSOR) 50 MG tablet Take 1 tablet (50 mg total) by mouth 2 (two) times daily. Patient taking  differently: Take 25 mg by mouth 2 (two) times daily.  11/12/16   Rosalio Macadamia, NP  naproxen sodium (ANAPROX) 220 MG tablet Take 220 mg by mouth daily as needed (pain).    Historical Provider, MD  pantoprazole (PROTONIX) 40 MG tablet Take 1 tablet (40 mg total) by mouth daily. 10/20/16   Pete Glatter, MD    Family History Family History  Problem Relation Age of Onset  . Heart disease Mother   . Sleep apnea Mother   . Diabetes Father   . Heart disease Father   . Heart disease Maternal Grandmother   . Diabetes Maternal Grandfather   . Heart disease Maternal Grandfather     Social History Social History  Substance Use Topics  . Smoking status: Former Smoker    Quit date: 01/04/2011  . Smokeless tobacco: Never Used     Comment: quit 2009  . Alcohol use 0.0 oz/week     Comment: occasonal      Allergies   Patient has no known allergies.   Review of Systems Review of Systems  Respiratory: Positive for cough and shortness of breath.   All other systems reviewed and are negative.    Physical Exam Updated Vital Signs BP 120/74 (BP Location: Right Wrist)   Pulse 94   Temp 98.1 F (36.7 C) (Oral)   Resp 15   Ht 5\' 6"  (1.676 m)   Wt (!) 169.6 kg   LMP 12/19/2016 (Approximate)   SpO2 97%   BMI 60.37 kg/m   Physical Exam  Constitutional: She is oriented to person, place, and time. She appears well-developed and well-nourished.  Morbidly obese, NAD  HENT:  Head: Normocephalic and atraumatic.  Right Ear: Tympanic membrane and ear canal normal.  Left Ear: Tympanic membrane and ear canal normal.  Nose: Nose normal.  Mouth/Throat: Uvula is midline, oropharynx is clear and moist and mucous membranes are normal. No oropharyngeal exudate, posterior oropharyngeal edema, posterior oropharyngeal erythema or tonsillar abscesses.  Mild PND  Eyes: Conjunctivae and EOM are normal. Pupils are equal, round, and reactive to light.  Neck: Normal range of motion.  Cardiovascular:  Normal rate, regular rhythm and normal heart sounds.   Pulmonary/Chest: Effort normal and breath sounds normal. No respiratory distress. She has no wheezes. She has no rhonchi.  No acute distress, mild expiratory wheezes noted, no rhonchi, no distress, able to see give full sentences without difficulty  Abdominal: Soft. Bowel sounds are normal. There is no tenderness. There is no rebound.  Musculoskeletal: Normal range of motion.  Neurological: She is alert and oriented to person, place, and time.  Skin: Skin is warm and dry.  Psychiatric: She has a normal mood and affect.  Nursing note and  vitals reviewed.    ED Treatments / Results  Labs (all labs ordered are listed, but only abnormal results are displayed) Labs Reviewed - No data to display  EKG  EKG Interpretation None       Radiology Dg Chest 2 View  Result Date: 01/06/2017 CLINICAL DATA:  Recent URI, valve asthma attack, breathing difficulty. Current smoker. History of morbid obesity. EXAM: CHEST  2 VIEW COMPARISON:  PA and lateral chest x-ray of August 16, 2016 FINDINGS: The lungs are adequately inflated. There is no focal infiltrate. There is no pleural effusion. The heart and pulmonary vascularity are normal. The trachea is midline. The bony thorax exhibits no acute abnormality. IMPRESSION: There is no pneumonia, CHF, nor other acute cardiopulmonary abnormality. Electronically Signed   By: David  Swaziland M.D.   On: 01/06/2017 10:57    Procedures Procedures (including critical care time)  Medications Ordered in ED Medications  albuterol (PROVENTIL) (2.5 MG/3ML) 0.083% nebulizer solution 5 mg (5 mg Nebulization Given 01/06/17 1032)  albuterol (PROVENTIL) (2.5 MG/3ML) 0.083% nebulizer solution 5 mg (5 mg Nebulization Given 01/06/17 1206)  ipratropium (ATROVENT) nebulizer solution 0.5 mg (0.5 mg Nebulization Given 01/06/17 1206)  predniSONE (DELTASONE) tablet 60 mg (60 mg Oral Given 01/06/17 1206)     Initial Impression /  Assessment and Plan / ED Course  I have reviewed the triage vital signs and the nursing notes.  Pertinent labs & imaging results that were available during my care of the patient were reviewed by me and considered in my medical decision making (see chart for details).  31 year old female here with shortness of breath. History of recurrent asthma, changes in weather is known trigger. Has been doing nebs, however solution was expired.  She is afebrile and nontoxic. She does have some expiratory wheezes noted without any acute distress. Oxygen saturation is stable on room air. Chest x-ray was obtained without any acute findings.  Patient was treated here with prednisone and nebs with improvement of her symptoms. Her vitals remained stable on room air. Feel she is stable for discharge with continued treatment for asthma exacerbation. Will continue prednisone taper, have prescribed new albuterol solution for home. Encouraged to use nebs every 4-6 hours as needed for shortness of breath or wheezing. She will follow-up closely with her PCP.  Discussed plan with patient, she acknowledged understanding and agreed with plan of care.  Return precautions given for new or worsening symptoms.  Final Clinical Impressions(s) / ED Diagnoses   Final diagnoses:  Mild intermittent asthma with exacerbation  Cough    New Prescriptions New Prescriptions   ALBUTEROL (PROVENTIL) (2.5 MG/3ML) 0.083% NEBULIZER SOLUTION    Take 6 mLs (5 mg total) by nebulization every 6 (six) hours as needed for wheezing or shortness of breath.   PREDNISONE (DELTASONE) 20 MG TABLET    Take 40 mg by mouth daily for 3 days, then 20mg  by mouth daily for 3 days, then 10mg  daily for 3 days     Garlon Hatchet, PA-C 01/06/17 1338    Jacalyn Lefevre, MD 01/06/17 1400

## 2017-01-23 ENCOUNTER — Ambulatory Visit (HOSPITAL_COMMUNITY)
Admission: EM | Admit: 2017-01-23 | Discharge: 2017-01-23 | Disposition: A | Discharge status: Home / Self Care | Payer: Self-pay | Attending: Internal Medicine | Admitting: Internal Medicine

## 2017-01-23 ENCOUNTER — Encounter (HOSPITAL_COMMUNITY): Payer: Self-pay | Admitting: Emergency Medicine

## 2017-01-23 DIAGNOSIS — M62830 Muscle spasm of back: Secondary | ICD-10-CM

## 2017-01-23 MED ORDER — DICLOFENAC SODIUM 75 MG PO TBEC
75.0000 mg | DELAYED_RELEASE_TABLET | Freq: Two times a day (BID) | ORAL | 0 refills | Status: DC
Start: 2017-01-23 — End: 2017-05-19

## 2017-01-23 MED ORDER — CYCLOBENZAPRINE HCL 10 MG PO TABS: 10.0000 mg | ORAL_TABLET | Freq: Two times a day (BID) | ORAL | 0 refills | Status: DC | PRN

## 2017-01-23 NOTE — ED Triage Notes (Signed)
Pt here for upper back pain onset this am ... Pain increases w/activity and breathe  Denies inj/trauma, strenuous activity  Slow gait... A&O x4... NAD

## 2017-01-23 NOTE — ED Provider Notes (Signed)
CSN: [id number]     Arrival date & time 01/23/17  1947 History   First MD Initiated Contact with Patient 01/23/17 2011     Chief Complaint  Patient presents with  . Back Pain   (Consider location/radiation/quality/duration/timing/severity/associated sxs/prior Treatment) 31 year old female presents with cramping and spasm on the left back and shoulder. Start earlier today, she denies any trauma to the area, has not lifted anything heavy, not twisted anything, pain is worse with deep inspiration and expiration, and pain is worse with movement.   The history is provided by the patient.  Back Pain  Location:  Thoracic spine Quality:  Cramping, shooting and stabbing Radiates to:  L shoulder Pain severity:  Severe Pain is:  Same all the time Onset quality:  Gradual Duration:  1 day Timing:  Constant Progression:  Unchanged Chronicity:  New Context: not falling, not jumping from heights, not lifting heavy objects, not MCA, not MVA and not twisting   Relieved by:  Nothing Worsened by:  Palpation, twisting and movement Ineffective treatments:  Muscle relaxants Associated symptoms: no abdominal pain, no chest pain, no dysuria, no fever, no numbness, no paresthesias and no tingling     Past Medical History:  Diagnosis Date  . Anemia    history of anemia  . Asthma   . Chest pain of uncertain etiology    intermittent left side chest pain  . Chlamydia   . Genital herpes   . Hypertension    no meds since April  . Morbid obesity (HCC)   . Obese   . PCOS (polycystic ovarian syndrome)   . Shortness of breath    on exertion from weight  . Thyroid disease   . Urinary tract infection    Past Surgical History:  Procedure Laterality Date  . DILATION AND CURETTAGE OF UTERUS    . HYSTEROSCOPY W/D&C N/A 04/06/2014   Procedure: DILATATION AND CURETTAGE /HYSTEROSCOPY ;  Surgeon: Tereso Newcomer, MD;  Location: [facility];  Service: Gynecology;  Laterality: N/A;  . LEFT HEART CATH AND CORONARY  ANGIOGRAPHY N/A 12/12/2016   Procedure: Left Heart Cath and Coronary Angiography;  Surgeon: Peter M Swaziland, MD;  Location: [facility] INVAS[facility] CV LAB;  Service: Cardiovascular;  Laterality: N/A;  . NO PAST SURGERIES    . WISDOM TOOTH EXTRACTION     Family History  Problem Relation Age of Onset  . Heart disease Mother   . Sleep apnea Mother   . Diabetes Father   . Heart disease Father   . Heart disease Maternal Grandmother   . Diabetes Maternal Grandfather   . Heart disease Maternal Grandfather    Social History  Substance Use Topics  . Smoking status: Former Smoker    Quit date: 01/04/2011  . Smokeless tobacco: Never Used     Comment: quit 2009  . Alcohol use 0.0 oz/week     Comment: occasonal    OB History    Gravida Para Term Preterm AB Living   0             SAB TAB Ectopic Multiple Live Births                 Review of Systems  Constitutional: Negative for chills and fever.  HENT: Negative.   Respiratory: Negative for cough, chest tightness, shortness of breath and wheezing.   Cardiovascular: Negative for chest pain and palpitations.  Gastrointestinal: Negative for abdominal pain, diarrhea, nausea and vomiting.  Genitourinary: Negative for dysuria and frequency.  Musculoskeletal:  Positive for back pain. Negative for neck pain and neck stiffness.  Neurological: Negative for tingling, light-headedness, numbness and paresthesias.  All other systems reviewed and are negative.   Allergies  Patient has no known allergies.  Home Medications   Prior to Admission medications   Medication Sig Start Date End Date Taking? Authorizing Provider  albuterol (PROVENTIL) (2.5 MG/3ML) 0.083% nebulizer solution Take 6 mLs (5 mg total) by nebulization every 6 (six) hours as needed for wheezing or shortness of breath. 01/06/17  Yes Garlon Hatchet, PA-C  aspirin EC 81 MG tablet Take 1 tablet (81 mg total) by mouth daily. 11/12/16  Yes Rosalio Macadamia, NP  beclomethasone (QVAR) 40 MCG/ACT inhaler  Inhale 2 puffs into the lungs 2 (two) times daily as needed (sob). Patient taking differently: Inhale 2 puffs into the lungs 2 (two) times daily as needed (shortness of breath).  06/23/16  Yes Pete Glatter, MD  hydrochlorothiazide (HYDRODIURIL) 25 MG tablet Take 1 tablet (25 mg total) by mouth daily. Must have office visit for refills Patient taking differently: Take 50 mg by mouth daily. Must have office visit for refills 10/20/16  Yes Pete Glatter, MD  levothyroxine (SYNTHROID, LEVOTHROID) 50 MCG tablet Take 1 tablet (50 mcg total) by mouth daily. 10/21/16  Yes Pete Glatter, MD  metoprolol (LOPRESSOR) 50 MG tablet Take 1 tablet (50 mg total) by mouth 2 (two) times daily. Patient taking differently: Take 25 mg by mouth 2 (two) times daily.  11/12/16  Yes Rosalio Macadamia, NP  pantoprazole (PROTONIX) 40 MG tablet Take 1 tablet (40 mg total) by mouth daily. 10/20/16  Yes Pete Glatter, MD  cyclobenzaprine (FLEXERIL) 10 MG tablet Take 1 tablet (10 mg total) by mouth 2 (two) times daily as needed for muscle spasms. 01/23/17   Dorena Bodo, NP  diclofenac (VOLTAREN) 75 MG EC tablet Take 1 tablet (75 mg total) by mouth 2 (two) times daily. 01/23/17   Dorena Bodo, NP  naproxen sodium (ANAPROX) 220 MG tablet Take 220 mg by mouth daily as needed (pain).    Historical Provider, MD  predniSONE (DELTASONE) 20 MG tablet Take 40 mg by mouth daily for 3 days, then  by mouth daily for 3 days, then  daily for 3 days 01/06/17   Garlon Hatchet, PA-C   Meds Ordered and Administered this Visit  Medications - No data to display  BP (!) 126/91 (BP Location: Left Wrist)   Pulse (!) 127   Temp 97.8 F (36.6 C) (Oral)   Resp 16   LMP 01/20/2017   SpO2 99%  No data found.   Physical Exam  Constitutional: She is oriented to person, place, and time. She appears well-developed and well-nourished. No distress.  HENT:  Head: Normocephalic and atraumatic.  Right Ear: External ear normal.  Left  Ear: External ear normal.  Cardiovascular: Normal rate and regular rhythm.   Pulmonary/Chest: Effort normal and breath sounds normal. No respiratory distress. She has no wheezes.  Abdominal: Soft. Bowel sounds are normal.  Musculoskeletal:       Left shoulder: She exhibits tenderness, pain and spasm. She exhibits no swelling.       Arms: Neurological: She is alert and oriented to person, place, and time.  Skin: Skin is warm and dry. Capillary refill takes less than 2 seconds. No rash noted. She is not diaphoretic. No erythema.  Psychiatric: She has a normal mood and affect. Her behavior is normal.  Nursing note and vitals  reviewed.   Urgent Care Course     Procedures (including critical care time)  Labs Review Labs Reviewed - No data to display  Imaging Review No results found.      MDM   1. Muscle spasm of back     Treating for muscle spasm. Given rx for flexeril and diclofenac, advised rest, ice, alternated with heat, follow up with PCP if pain persists.     Dorena Bodo, NP 01/23/17 2027

## 2017-01-23 NOTE — Discharge Instructions (Signed)
You most likely have a muscle spasm in your left trapezius muscle. I have prescribed two medicines for your pain. The first is diclofenac, take 1 tablet twice a day and the other is Flexeril, take 1 tablet twice a day. Flexeril may cause drowsiness so do not drive until you know how this medicine affects you. Also do not drink any alcohol either. You may apply ice and alternate with heat for 15 minutes at a time 4 times daily and for additional pain control you may take tylenol over the counter ever 4 hours but do not take more than 4000 mg a day. Should your pain continue or fail to resolve, follow up with your primary care provider or return to clinic as needed.

## 2017-01-28 MED FILL — LEVOTHYROXINE 50 MCG TABLET: 50 | 30 days supply | Qty: 30 | Fill #2

## 2017-01-28 MED FILL — HYDROCHLOROTHIAZIDE 25 MG T: 25 | 30 days supply | Qty: 30 | Fill #2

## 2017-01-28 MED FILL — METOPROLOL TARTRATE 50 MG T: 50 | 30 days supply | Qty: 30 | Fill #2

## 2017-01-28 MED FILL — PANTOPRAZOLE SOD DR 40 MG T: 40 | 30 days supply | Qty: 30 | Fill #2

## 2017-01-29 ENCOUNTER — Ambulatory Visit (INDEPENDENT_AMBULATORY_CARE_PROVIDER_SITE_OTHER): Payer: Self-pay | Admitting: Family Medicine

## 2017-01-29 ENCOUNTER — Encounter: Payer: Self-pay | Admitting: Family Medicine

## 2017-01-29 VITALS — BP 161/83 | HR 86 | Ht 66.0 in | Wt 375.2 lb

## 2017-01-29 DIAGNOSIS — E282 Polycystic ovarian syndrome: Secondary | ICD-10-CM

## 2017-01-29 DIAGNOSIS — E039 Hypothyroidism, unspecified: Secondary | ICD-10-CM

## 2017-01-29 MED ORDER — NORETHIN ACE-ETH ESTRAD-FE 1-20 MG-MCG(24) PO TABS: 1.0000 | ORAL_TABLET | Freq: Every day | ORAL | 11 refills | Status: DC

## 2017-01-29 NOTE — Progress Notes (Signed)
   Subjective:    Patient ID: Lori Mcknight, female    DOB: 12/13/1985, 31 y.o.   MRN: [id number]  HPI Patient seen For concerns of irregular bleeding. Her menstrual interval is anywhere from 21-35 days. She has 5-15 days of bleeding, usually with 3 days of spotting initially then 6 heavy days then 1 a spotting at the end. She has moderate cramps. In the past she has had continuous bleeding for several weeks time. She had been diagnosed with PCO S many years ago, but recently diagnosed with hypothyroidism.   Review of Systems     Objective:   Physical Exam  Constitutional: She appears well-developed and well-nourished.  Cardiovascular: Normal rate, regular rhythm and normal heart sounds.   Pulmonary/Chest: Effort normal and breath sounds normal. No respiratory distress. She has no wheezes. She has no rales. She exhibits no tenderness.  Abdominal: Soft. Bowel sounds are normal. There is no tenderness.  Neurological: She is alert.  Skin: Skin is warm and dry.  Psychiatric: She has a normal mood and affect. Her behavior is normal. Judgment and thought content normal.      Assessment & Plan:  1. PCOS (polycystic ovarian syndrome) Discussed that hypothyroidism and PCO as can both cause irregular menses. Will start on oral contraceptive pills, Loestrin, to see if we can improve control of her menses. As patient has not had formal testing for PCO S, will obtain FSH, LH, testosterone. - Follicle stimulating hormone - Luteinizing hormone - TestT+TestF+SHBG

## 2017-01-29 NOTE — Progress Notes (Signed)
Pt has a period every other month and spotting in between. She was recently diagnosed as hypothyroid. She would like to get her LH, FSH, and Progesterone levels checked.

## 2017-02-02 LAB — TESTT+TESTF+SHBG
Sex Hormone Binding: 42.5 nmol/L (ref 24.6–122.0)
Testosterone, Free: 1.1 pg/mL (ref 0.0–4.2)
Testosterone, total: 28 ng/dL (ref 10.0–55.0)

## 2017-02-02 LAB — FOLLICLE STIMULATING HORMONE: FSH: 3.6 m[IU]/mL

## 2017-02-02 LAB — LUTEINIZING HORMONE: LH: 7.1 m[IU]/mL

## 2017-02-02 MED ORDER — NORGESTIMATE-ETH ESTRADIOL 0.25-35 MG-MCG PO TABS: 1.0000 | ORAL_TABLET | Freq: Every day | ORAL | 11 refills | Status: DC

## 2017-02-04 ENCOUNTER — Telehealth: Payer: Self-pay | Admitting: *Deleted

## 2017-02-04 DIAGNOSIS — E282 Polycystic ovarian syndrome: Secondary | ICD-10-CM

## 2017-02-04 MED ORDER — NORGESTIMATE-ETH ESTRADIOL 0.25-35 MG-MCG PO TABS: 1.0000 | ORAL_TABLET | Freq: Every day | ORAL | 11 refills | Status: DC

## 2017-02-04 NOTE — Telephone Encounter (Signed)
Pt left message yesterday stating that she was supposed to have Rx for a cheaper birth control prescribed. Her [facility] pharmacy does not have the prescription.  Per chart review, Rx had been sent to a different pharmacy on 4/23.  I called pt and explained the situation. Rx was then e-prescribed to her preferred pharmacy today.  Pt voiced understanding.

## 2017-02-05 ENCOUNTER — Encounter: Payer: Self-pay | Admitting: Family Medicine

## 2017-02-26 ENCOUNTER — Encounter: Payer: Self-pay | Admitting: Internal Medicine

## 2017-03-10 ENCOUNTER — Encounter: Payer: Self-pay | Admitting: Nurse Practitioner

## 2017-03-10 ENCOUNTER — Ambulatory Visit (INDEPENDENT_AMBULATORY_CARE_PROVIDER_SITE_OTHER): Payer: Self-pay | Admitting: Nurse Practitioner

## 2017-03-10 VITALS — BP 128/80 | HR 78 | Ht 65.0 in | Wt 376.4 lb

## 2017-03-10 DIAGNOSIS — R0789 Other chest pain: Secondary | ICD-10-CM

## 2017-03-10 MED ORDER — METOPROLOL TARTRATE 50 MG PO TABS: 50.0000 mg | ORAL_TABLET | Freq: Two times a day (BID) | ORAL | 3 refills | Status: DC

## 2017-03-10 NOTE — Progress Notes (Signed)
CARDIOLOGY OFFICE NOTE  Date:  03/10/2017    Lori C Nazaire Date of Birth: 05-18-1986 Medical Record #[id number]  PCP:  Pete GlatterLangeland, Dawn T, MD  Cardiologist:  [facility]    Chief Complaint  Patient presents with  . Chest Pain    Follow up visit - seen for Dr. Elease HashimotoNahser    History of Present Illness: Lori Mcknight is a 31 y.o. female who presents today for a follow up visit. She is seen for Dr. Elease HashimotoNahser.   She has a history of HTN x 15 years, morbid obesity, asthma, tobacco abuse and chronic atypical chest pain. Her morbid obesity has made non invasive testing impossible. She ended up being cathed back in March of 2018 and this was normal. Her chest pain is NOT felt to be cardiac.   Comes in today. Here with her mom today. Still with lots of complaints. Still with sharp chest pains and "flutters. This is a chronic complaint. Her legs are swelling. She feels like she "has no circulation". She is short of breath with activity. Continues to gain weight. Fortunately, BP is ok.   Past Medical History:  Diagnosis Date  . Anemia    history of anemia  . Asthma   . Chest pain of uncertain etiology    intermittent left side chest pain  . Chlamydia   . Genital herpes   . Hypertension    no meds since April  . Morbid obesity (HCC)   . Obese   . PCOS (polycystic ovarian syndrome)   . Shortness of breath    on exertion from weight  . Thyroid disease   . Urinary tract infection     Past Surgical History:  Procedure Laterality Date  . DILATION AND CURETTAGE OF UTERUS    . HYSTEROSCOPY W/D&C N/A 04/06/2014   Procedure: DILATATION AND CURETTAGE /HYSTEROSCOPY ;  Surgeon: Tereso NewcomerUgonna A Anyanwu, MD;  Location: [facility];  Service: Gynecology;  Laterality: N/A;  . LEFT HEART CATH AND CORONARY ANGIOGRAPHY N/A 12/12/2016   Procedure: Left Heart Cath and Coronary Angiography;  Surgeon: Peter M SwazilandJordan, MD;  [facility] CV LAB;  Service: Cardiovascular;  Laterality: N/A;  . NO PAST  SURGERIES    . WISDOM TOOTH EXTRACTION       Medications: Current Outpatient Prescriptions  Medication Sig Dispense Refill  . albuterol (PROVENTIL) (2.5 MG/3ML) 0.083% nebulizer solution Take 6 mLs (5 mg total) by nebulization every 6 (six) hours as needed for wheezing or shortness of breath. 100 mL 0  . aspirin EC 81 MG tablet Take 1 tablet (81 mg total) by mouth daily. 90 tablet 3  . beclomethasone (QVAR) 40 MCG/ACT inhaler Inhale 2 puffs into the lungs 2 (two) times daily as needed (sob). (Patient taking differently: Inhale 2 puffs into the lungs 2 (two) times daily as needed (shortness of breath). ) 1 Inhaler 12  . cyclobenzaprine (FLEXERIL) 10 MG tablet Take 1 tablet (10 mg total) by mouth 2 (two) times daily as needed for muscle spasms. 20 tablet 0  . diclofenac (VOLTAREN) 75 MG EC tablet Take 1 tablet (75 mg total) by mouth 2 (two) times daily. 20 tablet 0  . hydrochlorothiazide (HYDRODIURIL) 25 MG tablet Take 1 tablet (25 mg total) by mouth daily. Must have office visit for refills (Patient taking differently: Take 50 mg by mouth daily. Must have office visit for refills) 90 tablet 3  . levothyroxine (SYNTHROID, LEVOTHROID) 50 MCG tablet Take 1 tablet (50 mcg  total) by mouth daily. 90 tablet 3  . metoprolol tartrate (LOPRESSOR) 50 MG tablet Take 1 tablet (50 mg total) by mouth 2 (two) times daily. 180 tablet 3  . Norethindrone Acetate-Ethinyl Estrad-FE (LOESTRIN 24 FE) 1-20 MG-MCG(24) tablet Take 1 tablet by mouth daily. 1 Package 11  . norgestimate-ethinyl estradiol (ORTHO-CYCLEN,SPRINTEC,PREVIFEM) 0.25-35 MG-MCG tablet Take 1 tablet by mouth daily. 1 Package 11  . pantoprazole (PROTONIX) 40 MG tablet Take 1 tablet (40 mg total) by mouth daily. 30 tablet 3   No current facility-administered medications for this visit.     Allergies: No Known Allergies  Social History: The patient  reports that she quit smoking about 6 years ago. She has never used smokeless tobacco. She reports  that she drinks alcohol. She reports that she does not use drugs.   Family History: The patient's family history includes Diabetes in her father and maternal grandfather; Heart disease in her father, maternal grandfather, maternal grandmother, and mother; Sleep apnea in her mother.   Review of Systems: Please see the history of present illness.   Otherwise, the review of systems is positive for none.   All other systems are reviewed and negative.   Physical Exam: VS:  BP 128/80 (BP Location: Left Arm, Patient Position: Sitting, Cuff Size: Large)   Pulse 78   Ht 5\' 5"  (1.651 m)   Wt (!) 376 lb 6.4 oz (170.7 kg)   SpO2 100% Comment: at rest  BMI 62.64 kg/m  .  BMI Body mass index is 62.64 kg/m.  Wt Readings from Last 3 Encounters:  03/10/17 (!) 376 lb 6.4 oz (170.7 kg)  01/29/17 (!) 375 lb 3.2 oz (170.2 kg)  01/06/17 (!) 374 lb (169.6 kg)    General: Morbidly obese. She is alert and in no acute distress.  HEENT: Normal.  Neck: Supple, no JVD, carotid bruits, or masses noted.  Cardiac: Regular rate and rhythm. No murmurs, rubs, or gallops. No edema.  Respiratory:  Lungs are clear to auscultation bilaterally with normal work of breathing.  GI: Soft and nontender. Obese.  MS: No deformity or atrophy. Gait and ROM intact.  Skin: Warm and dry. Color is normal.  Neuro:  Strength and sensation are intact and no gross focal deficits noted.  Psych: Alert, appropriate and with normal affect.   LABORATORY DATA:  EKG:  EKG is not ordered today.  Lab Results  Component Value Date   WBC 6.9 12/09/2016   HGB CANCELED 10/20/2016   HCT 42.2 12/09/2016   PLT 236 12/09/2016   GLUCOSE 96 12/09/2016   CHOL 199 11/12/2016   TRIG 87 11/12/2016   HDL 56 11/12/2016   LDLCALC 126 (H) 11/12/2016   ALT 34 (H) 11/12/2016   AST 33 11/12/2016   NA 141 12/09/2016   K 3.8 12/09/2016   CL 101 12/09/2016   CREATININE 0.75 12/09/2016   BUN 10 12/09/2016   CO2 24 12/09/2016   TSH 0.15 (L)  10/20/2016   INR 1.0 12/09/2016   HGBA1C 5.5 11/12/2016    BNP (last 3 results)  Recent Labs  10/20/16 1205  BNP CANCELED    ProBNP (last 3 results)  Recent Labs  11/12/16 1042  PROBNP <5     Other Studies Reviewed Today:  Cardiac Cath Conclusion 12/2016     The left ventricular systolic function is normal.  LV end diastolic pressure is normal.  The left ventricular ejection fraction is 55-65% by visual estimate.   1. Normal coronary anatomy 2. Normal  LV function 3. Elevated LVEDP  Plan: consider other causes of chest pain. If patient requires cardiac cath in the future I would avoid using the right radial approach.      Assessment/Plan:  1. Atypical chest pain - with multiple risk factors (+FH, smoking, obesity, HTN). This has been chronic in nature/frequency - over the past 1 to 2 years - She was not a candidate for non invasive testing - she underwent cardiac cath back in March and this was normal with no evidence of CAD and with normal LV function.  Her chest pain is not felt to be cardiac in origin and she will need to follow with her PCP for other evaluation. I explained that I think her symptoms could be GI/musculoskeletal but that she still needs to make her health a priority for long term prognosis.   2. Morbid obesity - chronic - I do not see this changing unfortunately.   3. Asthma - advised total smoking cessation. Does not appear ready to stop. She does not seem to understand the importance of stopping.   4. HTN - BP improved on current regimen.   5. Abnormal EKG - chronic - recent reassuring cardiac cath noted.   6. Palpitations - would keep on beta blocker.   Current medicines are reviewed with the patient today.  The patient does not have concerns regarding medicines other than what has been noted above.  The following changes have been made:  See above.  Labs/ tests ordered today include:   No orders of the defined types were  placed in this encounter.    Disposition:   FU with [place] prn.    Patient is agreeable to this plan and will call if any problems develop in the interim.   SignedNorma Fredrickson, NP  03/10/2017 9:12 AM  [facility] [address] [place], [place]  [address] Phone: [phone] Fax: [phone]

## 2017-03-10 NOTE — Patient Instructions (Signed)
We will be checking the following labs today - NONR   Medication Instructions:    Continue with your current medicines.   I refilled your Lopressor today    Testing/Procedures To Be Arranged:  N/A  Follow-Up:   See back prn.     Other Special Instructions:   Make a visit with your primary care doctor.    If you need a refill on your cardiac medications before your next appointment, please call your pharmacy.   Call the [facility] office at [phone] if you have any questions, problems or concerns.

## 2017-04-09 MED FILL — LEVOTHYROXINE 50 MCG TABLET: 50 | 30 days supply | Qty: 30 | Fill #3

## 2017-05-19 ENCOUNTER — Emergency Department (HOSPITAL_COMMUNITY)
Admission: EM | Admit: 2017-05-19 | Discharge: 2017-05-19 | Disposition: A | Discharge status: Home / Self Care | Payer: Self-pay | Attending: Emergency Medicine | Admitting: Emergency Medicine

## 2017-05-19 ENCOUNTER — Encounter (HOSPITAL_COMMUNITY): Payer: Self-pay | Admitting: *Deleted

## 2017-05-19 ENCOUNTER — Encounter (HOSPITAL_COMMUNITY): Payer: Self-pay | Admitting: Emergency Medicine

## 2017-05-19 DIAGNOSIS — M5431 Sciatica, right side: Secondary | ICD-10-CM | POA: Insufficient documentation

## 2017-05-19 DIAGNOSIS — J45909 Unspecified asthma, uncomplicated: Secondary | ICD-10-CM | POA: Insufficient documentation

## 2017-05-19 DIAGNOSIS — Z7982 Long term (current) use of aspirin: Secondary | ICD-10-CM | POA: Insufficient documentation

## 2017-05-19 DIAGNOSIS — M545 Low back pain: Secondary | ICD-10-CM | POA: Insufficient documentation

## 2017-05-19 DIAGNOSIS — Z87891 Personal history of nicotine dependence: Secondary | ICD-10-CM | POA: Insufficient documentation

## 2017-05-19 DIAGNOSIS — I10 Essential (primary) hypertension: Secondary | ICD-10-CM | POA: Insufficient documentation

## 2017-05-19 DIAGNOSIS — Z79899 Other long term (current) drug therapy: Secondary | ICD-10-CM | POA: Insufficient documentation

## 2017-05-19 DIAGNOSIS — Z5321 Procedure and treatment not carried out due to patient leaving prior to being seen by health care provider: Secondary | ICD-10-CM | POA: Insufficient documentation

## 2017-05-19 DIAGNOSIS — E039 Hypothyroidism, unspecified: Secondary | ICD-10-CM | POA: Insufficient documentation

## 2017-05-19 MED ORDER — METHOCARBAMOL 500 MG PO TABS: 500.0000 mg | ORAL_TABLET | Freq: Two times a day (BID) | ORAL | 0 refills | Status: DC

## 2017-05-19 MED ORDER — DICLOFENAC SODIUM 75 MG PO TBEC
75.0000 mg | DELAYED_RELEASE_TABLET | Freq: Two times a day (BID) | ORAL | 0 refills | Status: DC
Start: 2017-05-19 — End: 2017-06-05

## 2017-05-19 NOTE — Discharge Instructions (Signed)
See your Physician for recheck in 1 week  °

## 2017-05-19 NOTE — ED Triage Notes (Signed)
Pt c/o right leg tingling and numbness onset a few days after having severe leg cramp 1 month ago. Low back pain onset Sunday, unsure if she may have injured it while hyperextending spine. Sunday night pt felt shaky, had difficulty ambulating, and her right fingers began cramping and "curling in," right arm and leg numbness. No bowel or bladder changes. No new symptoms today.

## 2017-05-19 NOTE — ED Provider Notes (Signed)
[facility]-EMERGENCY DEPT Provider Note   CSN: [id number] Arrival date & time: 05/19/17  2001     History   Chief Complaint Chief Complaint  Patient presents with  . Leg Pain    HPI Lori Mcknight is a 31 y.o. female.  The history is provided by the patient. No language interpreter was used.  Leg Pain   This is a new problem. The current episode started more than 1 week ago. The problem occurs constantly. The problem has been gradually worsening. The pain is present in the back, right upper leg and right lower leg. The quality of the pain is described as aching. The pain is moderate. Associated symptoms include numbness. The symptoms are aggravated by standing. She has tried nothing for the symptoms. The treatment provided no relief. There has been no history of extremity trauma.   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  Pt complains of pain in low back down right leg,   Past Medical History:  Diagnosis Date  . Anemia    history of anemia  . Asthma   . Chest pain of uncertain etiology    intermittent left side chest pain  . Chlamydia   . Genital herpes   . Hypertension    no meds since April  . Morbid obesity (HCC)   . Obese   . PCOS (polycystic ovarian syndrome)   . Shortness of breath    on exertion from weight  . Thyroid disease   . Urinary tract infection     Patient Active Problem List   Diagnosis Date Noted  . Hypothyroid 01/29/2017  . Migraines 08/26/2016  . Chest pain 02/04/2016  . Abnormal uterine bleeding (AUB) 01/09/2014  . Endometrial thickening on ultra sound 12/02/2013  . Hypertension 10/21/2013  . PCOS (polycystic ovarian syndrome) 11/06/2011  . Obesity, Class III, BMI 40-49.9 (morbid obesity) (HCC) 11/06/2011  . Infertility, female 11/06/2011  . Pelvic pain in female 08/01/2011    Past Surgical History:  Procedure Laterality Date  . DILATION AND CURETTAGE OF UTERUS    . HYSTEROSCOPY W/D&C N/A 04/06/2014    Procedure: DILATATION AND CURETTAGE /HYSTEROSCOPY ;  Surgeon: Tereso Newcomer, MD;  Location: [facility];  Service: Gynecology;  Laterality: N/A;  . LEFT HEART CATH AND CORONARY ANGIOGRAPHY N/A 12/12/2016   Procedure: Left Heart Cath and Coronary Angiography;  Surgeon: Peter M Swaziland, MD;  Location: [facility] INVASIVE CV LAB;  Service: Cardiovascular;  Laterality: N/A;  . NO PAST SURGERIES    . WISDOM TOOTH EXTRACTION      OB History    Gravida Para Term Preterm AB Living   0             SAB TAB Ectopic Multiple Live Births                   Home Medications    Prior to Admission medications   Medication Sig Start Date End Date Taking? Authorizing Provider  albuterol (PROVENTIL) (2.5 MG/3ML) 0.083% nebulizer solution Take 6 mLs (5 mg total) by nebulization every 6 (six) hours as needed for wheezing or shortness of breath. 01/06/17   Garlon Hatchet, PA-C  aspirin EC 81 MG tablet Take 1 tablet (81 mg total) by mouth daily. 11/12/16   Rosalio Macadamia, NP  beclomethasone (QVAR) 40 MCG/ACT inhaler Inhale 2 puffs into the lungs 2 (two) times daily as needed (sob). Patient taking differently: Inhale 2 puffs into the lungs  2 (two) times daily as needed (shortness of breath).  06/23/16   Pete GlatterLangeland, Dawn T, MD  cyclobenzaprine (FLEXERIL) 10 MG tablet Take 1 tablet (10 mg total) by mouth 2 (two) times daily as needed for muscle spasms. 01/23/17   Dorena BodoKennard, Lawrence, NP  diclofenac (VOLTAREN) 75 MG EC tablet Take 1 tablet (75 mg total) by mouth 2 (two) times daily. 01/23/17   Dorena BodoKennard, Lawrence, NP  hydrochlorothiazide (HYDRODIURIL) 25 MG tablet Take 1 tablet (25 mg total) by mouth daily. Must have office visit for refills Patient taking differently: Take 50 mg by mouth daily. Must have office visit for refills 10/20/16   Dierdre SearlesLangeland, Dawn T, MD  levothyroxine (SYNTHROID, LEVOTHROID) 50 MCG tablet Take 1 tablet (50 mcg total) by mouth daily. 10/21/16   Pete GlatterLangeland, Dawn T, MD  metoprolol tartrate (LOPRESSOR) 50 MG tablet  Take 1 tablet (50 mg total) by mouth 2 (two) times daily. 03/10/17   Rosalio MacadamiaGerhardt, Lori C, NP  Norethindrone Acetate-Ethinyl Estrad-FE (LOESTRIN 24 FE) 1-20 MG-MCG(24) tablet Take 1 tablet by mouth daily. 01/29/17   Levie HeritageStinson, Jacob J, DO  norgestimate-ethinyl estradiol (ORTHO-CYCLEN,SPRINTEC,PREVIFEM) 0.25-35 MG-MCG tablet Take 1 tablet by mouth daily. 02/04/17   Katrinka BlazingSmith, [place], CNM  pantoprazole (PROTONIX) 40 MG tablet Take 1 tablet (40 mg total) by mouth daily. 10/20/16   Pete GlatterLangeland, Dawn T, MD    Family History Family History  Problem Relation Age of Onset  . Heart disease Mother   . Sleep apnea Mother   . Diabetes Father   . Heart disease Father   . Heart disease Maternal Grandmother   . Diabetes Maternal Grandfather   . Heart disease Maternal Grandfather     Social History Social History  Substance Use Topics  . Smoking status: Former Smoker    Quit date: 01/04/2011  . Smokeless tobacco: Never Used     Comment: quit 2009  . Alcohol use 0.0 oz/week     Comment: occasonal      Allergies   Patient has no known allergies.   Review of Systems Review of Systems  Neurological: Positive for numbness.  All other systems reviewed and are negative.    Physical Exam Updated Vital Signs BP 131/79 (BP Location: Right Arm)   Pulse 73   Temp 98.5 F (36.9 C) (Oral)   Resp 16   Ht 5\' 6"  (1.676 m)   Wt (!) 171 kg (377 lb)   LMP 05/19/2017   SpO2 99%   BMI 60.85 kg/m   Physical Exam  Constitutional: She appears well-developed and well-nourished.  HENT:  Head: Normocephalic.  Cardiovascular: Normal rate.   Pulmonary/Chest: Effort normal.  Abdominal: Soft.  Musculoskeletal: Normal range of motion.  Neurological: She is alert.  Skin: Skin is warm.  Psychiatric: She has a normal mood and affect.  Nursing note and vitals reviewed.    ED Treatments / Results  Labs (all labs ordered are listed, but only abnormal results are displayed) Labs Reviewed - No data to  display  EKG  EKG Interpretation None       Radiology No results found.  Procedures Procedures (including critical care time)  Medications Ordered in ED Medications - No data to display   Initial Impression / Assessment and Plan / ED Course  I have reviewed the triage vital signs and the nursing notes.  Pertinent labs & imaging results that were available during my care of the patient were reviewed by me and considered in my medical decision making (see chart for details).  Pt counseled on sciatica,  Pt advised to follow up with her MD for recheck.   Final Clinical Impressions(s) / ED Diagnoses   Final diagnoses:  Sciatica of right side    New Prescriptions New Prescriptions   DICLOFENAC (VOLTAREN) 75 MG EC TABLET    Take 1 tablet (75 mg total) by mouth 2 (two) times daily.   METHOCARBAMOL (ROBAXIN) 500 MG TABLET    Take 1 tablet (500 mg total) by mouth 2 (two) times daily.  An After Visit Summary was printed and given to the patient.   Osie Cheeks 05/19/17 2105    Eber Hong, MD 05/20/17 [phone]

## 2017-05-19 NOTE — ED Notes (Signed)
Pt alert & oriented x4, stable gait. Patient given discharge instructions, paperwork & prescription(s). Patient  instructed to stop at the registration desk to finish any additional paperwork. Patient verbalized understanding. Pt left department w/ no further questions. 

## 2017-05-19 NOTE — ED Triage Notes (Signed)
Pt reports right leg pain for over a month. States some numbness & pin & needle sensation.

## 2017-05-28 ENCOUNTER — Emergency Department (HOSPITAL_COMMUNITY)
Admission: EM | Admit: 2017-05-28 | Discharge: 2017-05-28 | Disposition: A | Discharge status: Home / Self Care | Payer: Self-pay | Attending: Emergency Medicine | Admitting: Emergency Medicine

## 2017-05-28 ENCOUNTER — Encounter (HOSPITAL_COMMUNITY): Payer: Self-pay | Admitting: Emergency Medicine

## 2017-05-28 DIAGNOSIS — Z87891 Personal history of nicotine dependence: Secondary | ICD-10-CM | POA: Insufficient documentation

## 2017-05-28 DIAGNOSIS — J45909 Unspecified asthma, uncomplicated: Secondary | ICD-10-CM | POA: Insufficient documentation

## 2017-05-28 DIAGNOSIS — G8929 Other chronic pain: Secondary | ICD-10-CM | POA: Insufficient documentation

## 2017-05-28 DIAGNOSIS — Z7982 Long term (current) use of aspirin: Secondary | ICD-10-CM | POA: Insufficient documentation

## 2017-05-28 DIAGNOSIS — Z79899 Other long term (current) drug therapy: Secondary | ICD-10-CM | POA: Insufficient documentation

## 2017-05-28 DIAGNOSIS — M5442 Lumbago with sciatica, left side: Secondary | ICD-10-CM | POA: Insufficient documentation

## 2017-05-28 DIAGNOSIS — E039 Hypothyroidism, unspecified: Secondary | ICD-10-CM | POA: Insufficient documentation

## 2017-05-28 DIAGNOSIS — I10 Essential (primary) hypertension: Secondary | ICD-10-CM | POA: Insufficient documentation

## 2017-05-28 MED ORDER — HYDROCODONE-ACETAMINOPHEN 5-325 MG PO TABS
2.0000 | ORAL_TABLET | Freq: Once | ORAL | Status: AC
  Administered 2017-05-28: 2 via ORAL
  Filled 2017-05-28: qty 2

## 2017-05-28 MED ORDER — METHOCARBAMOL 500 MG PO TABS: 500.0000 mg | ORAL_TABLET | Freq: Two times a day (BID) | ORAL | 0 refills | Status: DC

## 2017-05-28 MED ORDER — LIDOCAINE 5 % EX PTCH: 1.0000 | MEDICATED_PATCH | CUTANEOUS | 0 refills | Status: DC

## 2017-05-28 MED ORDER — PREDNISONE 20 MG PO TABS
60.0000 mg | ORAL_TABLET | Freq: Once | ORAL | Status: AC
  Administered 2017-05-28: 60 mg via ORAL
  Filled 2017-05-28: qty 3

## 2017-05-28 MED ORDER — KETOROLAC TROMETHAMINE 60 MG/2ML IM SOLN
60.0000 mg | Freq: Once | INTRAMUSCULAR | Status: AC
  Administered 2017-05-28: 60 mg via INTRAMUSCULAR
  Filled 2017-05-28: qty 2

## 2017-05-28 MED ORDER — PREDNISONE 10 MG (21) PO TBPK: ORAL_TABLET | ORAL | 0 refills | Status: DC

## 2017-05-28 MED ORDER — IBUPROFEN 600 MG PO TABS: 600.0000 mg | ORAL_TABLET | Freq: Four times a day (QID) | ORAL | 0 refills | Status: DC | PRN

## 2017-05-28 NOTE — Discharge Instructions (Signed)
Expect your soreness to increase over the next 2-3 days. Take it easy, but do not lay around too much as this may make any stiffness worse.  Antiinflammatory medications: Take 600 mg of ibuprofen every 6 hours or 440 mg (over the counter dose) to 500 mg (prescription dose) of naproxen every 12 hours or for the next 3 days. After this time, these medications may be used as needed for pain. Take these medications with food to avoid upset stomach. Choose only one of these medications, do not take them together.  Tylenol: Should you continue to have additional pain while taking the ibuprofen or naproxen, you may add in tylenol as needed. Your daily total maximum amount of tylenol from all sources should be limited to 4000mg /day for persons without liver problems, or 2000mg /day for those with liver problems. Muscle relaxer: Robaxin is a muscle relaxer and may help loosen stiff muscles. Do not take the Robaxin while driving or performing other dangerous activities.  Lidocaine patches: These are available via either prescription or over-the-counter. The over-the-counter option may be more economical one and are likely just as effective. There are multiple over-the-counter brands, such as Salonpas. Exercises: Be sure to perform the attached exercises starting with three times a week and working up to performing them daily. This is an essential part of preventing long term problems.   Follow up with a primary care provider or orthopedist for any future management of these complaints.

## 2017-05-28 NOTE — ED Triage Notes (Signed)
Patient here with complaints of lower left back pain radiating down into left leg. States that she has a hx of sciatica. Pain 10/10.

## 2017-05-28 NOTE — ED Provider Notes (Signed)
[facility]-EMERGENCY DEPT Provider Note   CSN: [id number] Arrival date & time: 05/28/17  1652     History   Chief Complaint Chief Complaint  Patient presents with  . Back Pain  . Leg Pain  . Sciatica    HPI Lori Mcknight is a 31 y.o. female.  HPI    Lori C Pho is a 31 y.o. female, with a history of Morbid obesity, HTN, back pain, and anemia, presenting to the ED with Left lower back pain beginning yesterday. Pain is aching, 10/10, radiating down the back of the left leg. States she has had this issue before. She has previously been prescribed diclofenac 75 mg and Robaxin 500 mg. She states she has been taking these as previously prescribed without improvement. She has tried no other medications. Endorses some paresthesias in the left foot. Denies falls/trauma, numbness, weakness, changes in bowel or bladder function, or any other complaints.     Past Medical History:  Diagnosis Date  . Anemia    history of anemia  . Asthma   . Chest pain of uncertain etiology    intermittent left side chest pain  . Chlamydia   . Genital herpes   . Hypertension    no meds since April  . Morbid obesity (HCC)   . Obese   . PCOS (polycystic ovarian syndrome)   . Shortness of breath    on exertion from weight  . Thyroid disease   . Urinary tract infection     Patient Active Problem List   Diagnosis Date Noted  . Hypothyroid 01/29/2017  . Migraines 08/26/2016  . Chest pain 02/04/2016  . Abnormal uterine bleeding (AUB) 01/09/2014  . Endometrial thickening on ultra sound 12/02/2013  . Hypertension 10/21/2013  . PCOS (polycystic ovarian syndrome) 11/06/2011  . Obesity, Class III, BMI 40-49.9 (morbid obesity) (HCC) 11/06/2011  . Infertility, female 11/06/2011  . Pelvic pain in female 08/01/2011    Past Surgical History:  Procedure Laterality Date  . DILATION AND CURETTAGE OF UTERUS    . HYSTEROSCOPY W/D&C N/A 04/06/2014   Procedure: DILATATION AND CURETTAGE /HYSTEROSCOPY ;  Surgeon:  Tereso Newcomer, MD;  Location: [facility];  Service: Gynecology;  Laterality: N/A;  . LEFT HEART CATH AND CORONARY ANGIOGRAPHY N/A 12/12/2016   Procedure: Left Heart Cath and Coronary Angiography;  Surgeon: Peter M Swaziland, MD;  Location: [facility] INVASIVE CV LAB;  Service: Cardiovascular;  Laterality: N/A;  . NO PAST SURGERIES    . WISDOM TOOTH EXTRACTION      OB History    Gravida Para Term Preterm AB Living   0             SAB TAB Ectopic Multiple Live Births                   Home Medications    Prior to Admission medications   Medication Sig Start Date End Date Taking? Authorizing Provider  albuterol (PROVENTIL) (2.5 MG/3ML) 0.083% nebulizer solution Take 6 mLs (5 mg total) by nebulization every 6 (six) hours as needed for wheezing or shortness of breath. 01/06/17   Garlon Hatchet, PA-C  aspirin EC 81 MG tablet Take 1 tablet (81 mg total) by mouth daily. 11/12/16   Rosalio Macadamia, NP  beclomethasone (QVAR) 40 MCG/ACT inhaler Inhale 2 puffs into the lungs 2 (two) times daily as needed (sob). Patient taking differently: Inhale 2 puffs into the lungs 2 (two) times daily as needed (shortness of breath).  06/23/16  Dierdre Searles T, MD  diclofenac (VOLTAREN) 75 MG EC tablet Take 1 tablet (75 mg total) by mouth 2 (two) times daily. 05/19/17   Elson Areas, PA-C  hydrochlorothiazide (HYDRODIURIL) 25 MG tablet Take 1 tablet (25 mg total) by mouth daily. Must have office visit for refills Patient taking differently: Take 50 mg by mouth daily. Must have office visit for refills 10/20/16   Dierdre Searles T, MD  levothyroxine (SYNTHROID, LEVOTHROID) 50 MCG tablet Take 1 tablet (50 mcg total) by mouth daily. 10/21/16   Pete Glatter, MD  methocarbamol (ROBAXIN) 500 MG tablet Take 1 tablet (500 mg total) by mouth 2 (two) times daily. 05/19/17   Elson Areas, PA-C  metoprolol tartrate (LOPRESSOR) 50 MG tablet Take 1 tablet (50 mg total) by mouth 2 (two) times daily. 03/10/17   Rosalio Macadamia, NP    Norethindrone Acetate-Ethinyl Estrad-FE (LOESTRIN 24 FE) 1-20 MG-MCG(24) tablet Take 1 tablet by mouth daily. 01/29/17   Levie Heritage, DO  norgestimate-ethinyl estradiol (ORTHO-CYCLEN,SPRINTEC,PREVIFEM) 0.25-35 MG-MCG tablet Take 1 tablet by mouth daily. 02/04/17   Katrinka Blazing, [place], CNM  pantoprazole (PROTONIX) 40 MG tablet Take 1 tablet (40 mg total) by mouth daily. 10/20/16   Pete Glatter, MD    Family History Family History  Problem Relation Age of Onset  . Heart disease Mother   . Sleep apnea Mother   . Diabetes Father   . Heart disease Father   . Heart disease Maternal Grandmother   . Diabetes Maternal Grandfather   . Heart disease Maternal Grandfather     Social History Social History  Substance Use Topics  . Smoking status: Former Smoker    Quit date: 01/04/2011  . Smokeless tobacco: Never Used     Comment: quit 2009  . Alcohol use 0.0 oz/week     Comment: occasonal      Allergies   Patient has no known allergies.   Review of Systems Review of Systems  Constitutional: Negative for chills and fever.  Gastrointestinal: Negative for abdominal pain.  Musculoskeletal: Positive for back pain.  Neurological: Negative for weakness and numbness.  All other systems reviewed and are negative.    Physical Exam Updated Vital Signs BP (!) 152/93 (BP Location: Right Arm)   Pulse 90   Temp 98.1 F (36.7 C) (Oral)   Resp 20   LMP 05/19/2017   SpO2 98%   Physical Exam  Constitutional: She appears well-developed and well-nourished. No distress.  Morbidly obese black female  HENT:  Head: Normocephalic and atraumatic.  Eyes: Conjunctivae are normal.  Neck: Neck supple.  Cardiovascular: Normal rate, regular rhythm and intact distal pulses.   Pulmonary/Chest: Effort normal. No respiratory distress.  Abdominal: Soft. There is no guarding.  Musculoskeletal: She exhibits no edema.  Tenderness to the left lumbar musculature into the left buttocks. Normal motor  function in the bilateral lower extremities. No midline spinal tenderness.  Neurological: She is alert.  Patient notes some paresthesias in the left foot, but otherwise sensory is intact. Flexion and extension strength at the left hip, knee, and ankle is 5/5. Patient is ambulatory with an antalgic gait, but without assistance or instability.  Skin: Skin is warm and dry. She is not diaphoretic.  Psychiatric: She has a normal mood and affect. Her behavior is normal.  Nursing note and vitals reviewed.    ED Treatments / Results  Labs (all labs ordered are listed, but only abnormal results are displayed) Labs Reviewed - No data to  display  EKG  EKG Interpretation None       Radiology No results found.  Procedures Procedures (including critical care time)  Medications Ordered in ED Medications  HYDROcodone-acetaminophen (NORCO/VICODIN) 5-325 MG per tablet 2 tablet (not administered)  ketorolac (TORADOL) injection 60 mg (not administered)  predniSONE (DELTASONE) tablet 60 mg (not administered)     Initial Impression / Assessment and Plan / ED Course  I have reviewed the triage vital signs and the nursing notes.  Pertinent labs & imaging results that were available during my care of the patient were reviewed by me and considered in my medical decision making (see chart for details).     Patient presents with symptoms of left-sided sciatica. No red flag symptoms. PCP versus orthopedic follow-up. The patient was given instructions for home care as well as return precautions. Patient voices understanding of these instructions, accepts the plan, and is comfortable with discharge.      Final Clinical Impressions(s) / ED Diagnoses   Final diagnoses:  Chronic left-sided low back pain with left-sided sciatica    New Prescriptions New Prescriptions   No medications on file     Concepcion LivingJoy, Othal Kubitz C, PA-C 05/28/17 2011    Phillis HaggisMabe, Martha L, MD 05/28/17 2127

## 2017-05-29 MED FILL — IBUPROFEN 600 MG TABLET: 600 | 7 days supply | Qty: 30 | Fill #0

## 2017-05-29 MED FILL — LIDODERM 5% PATCH: 5 | 30 days supply | Qty: 30 | Fill #0

## 2017-05-29 MED FILL — ?PREDNISONE 10 MG TABLET: 10 | 12 days supply | Qty: 42 | Fill #0

## 2017-05-29 MED FILL — METHOCARBAMOL 500 MG TABS: 500 | 10 days supply | Qty: 20 | Fill #0

## 2017-06-05 ENCOUNTER — Ambulatory Visit: Payer: Self-pay | Attending: Internal Medicine | Admitting: Physician Assistant

## 2017-06-05 VITALS — BP 140/85 | Temp 97.8°F | Resp 16 | Wt 370.0 lb

## 2017-06-05 DIAGNOSIS — Z6841 Body Mass Index (BMI) 40.0 and over, adult: Secondary | ICD-10-CM | POA: Insufficient documentation

## 2017-06-05 DIAGNOSIS — Z7951 Long term (current) use of inhaled steroids: Secondary | ICD-10-CM | POA: Insufficient documentation

## 2017-06-05 DIAGNOSIS — E669 Obesity, unspecified: Secondary | ICD-10-CM | POA: Insufficient documentation

## 2017-06-05 DIAGNOSIS — Z7982 Long term (current) use of aspirin: Secondary | ICD-10-CM | POA: Insufficient documentation

## 2017-06-05 DIAGNOSIS — M5442 Lumbago with sciatica, left side: Secondary | ICD-10-CM | POA: Insufficient documentation

## 2017-06-05 DIAGNOSIS — Z79899 Other long term (current) drug therapy: Secondary | ICD-10-CM | POA: Insufficient documentation

## 2017-06-05 MED ORDER — METHOCARBAMOL 500 MG PO TABS: 500.0000 mg | ORAL_TABLET | Freq: Two times a day (BID) | ORAL | 1 refills | Status: DC

## 2017-06-05 MED ORDER — TRAMADOL HCL 50 MG PO TABS: 50.0000 mg | ORAL_TABLET | Freq: Three times a day (TID) | ORAL | 0 refills | Status: DC | PRN

## 2017-06-05 MED ORDER — DICLOFENAC SODIUM 75 MG PO TBEC: 75.0000 mg | DELAYED_RELEASE_TABLET | Freq: Two times a day (BID) | ORAL | 0 refills | Status: DC

## 2017-06-05 MED FILL — METHOCARBAMOL 500 MG TABS: 500 | 30 days supply | Qty: 60 | Fill #0

## 2017-06-05 MED FILL — DICLOFENAC SOD DR 75 MG TAB: 75 | 20 days supply | Qty: 40 | Fill #0

## 2017-06-05 MED FILL — traMADol HCL 50 MG TABS: 50 | 10 days supply | Qty: 30 | Fill #0

## 2017-06-05 NOTE — Progress Notes (Signed)
Chief Complaint: Back pain  Subjective: This is a 31 year old female that presented to the emergency department twice this month for lower back pain with left-sided sciatica. Sometimes is an ongoing goes always down to her feet. Sometimes she has a crampy or spasm-like sensation. Sometimes she has sharp pain. Symptoms are aggravated by certain movements. Difficulty sleeping. Has been taking anti-inflammatories and muscle relaxers with temporary relief. She is out of medication. No imaging or labs done recently in the emergency department.   ROS:  GEN: denies fever or chills, denies change in weight Skin: denies lesions or rashes HEENT: denies headache, earache, epistaxis, sore throat, or neck pain LUNGS: denies SHOB, dyspnea, PND, orthopnea CV: denies CP or palpitations ABD: denies abd pain, N or V EXT: + muscle spasms or swelling; + pain in lower ext, + weakness NEURO: + numbness in left leg at times, no tingling, denies sz, stroke or TIA   Objective:  Vitals:   06/05/17 1103  BP: 140/85  Resp: 16  Temp: 97.8 F (36.6 C)  SpO2: 96%  Weight: (!) 370 lb (167.8 kg)    Physical Exam:  General: in no acute distress. Heart: Normal  s1 &s2  Regular rate and rhythm, without murmurs, rubs, gallops. Lungs: Clear to auscultation bilaterally. Extremities: No clubbing cyanosis or edema with positive pedal pulses. Decrease ROM; tearful upon standing Neuro: Alert, awake, oriented x3, nonfocal.   Medications: Prior to Admission medications   Medication Sig Start Date End Date Taking? Authorizing Provider  albuterol (PROVENTIL) (2.5 MG/3ML) 0.083% nebulizer solution Take 6 mLs (5 mg total) by nebulization every 6 (six) hours as needed for wheezing or shortness of breath. 01/06/17  Yes Garlon Hatchet, PA-C  beclomethasone (QVAR) 40 MCG/ACT inhaler Inhale 2 puffs into the lungs 2 (two) times daily as needed (sob). Patient taking differently: Inhale 2 puffs into the lungs 2 (two) times daily  as needed (shortness of breath).  06/23/16  Yes Langeland, Dawn T, MD  hydrochlorothiazide (HYDRODIURIL) 25 MG tablet Take 1 tablet (25 mg total) by mouth daily. Must have office visit for refills Patient taking differently: Take 50 mg by mouth daily. Must have office visit for refills 10/20/16  Yes Langeland, Dawn T, MD  ibuprofen (ADVIL,MOTRIN) 600 MG tablet Take 1 tablet (600 mg total) by mouth every 6 (six) hours as needed. 05/28/17  Yes Joy, Shawn C, PA-C  levothyroxine (SYNTHROID, LEVOTHROID) 50 MCG tablet Take 1 tablet (50 mcg total) by mouth daily. 10/21/16  Yes Langeland, Dawn T, MD  lidocaine (LIDODERM) 5 % Place 1 patch onto the skin daily. Remove & Discard patch within 12 hours or as directed by MD 05/28/17  Yes Joy, Shawn C, PA-C  methocarbamol (ROBAXIN) 500 MG tablet Take 1 tablet (500 mg total) by mouth 2 (two) times daily. 06/05/17  Yes Danelle Earthly, Sparrow [place] S, PA-C  metoprolol tartrate (LOPRESSOR) 50 MG tablet Take 1 tablet (50 mg total) by mouth 2 (two) times daily. 03/10/17  Yes Rosalio Macadamia, NP  predniSONE (STERAPRED UNI-PAK 21 TAB) 10 MG (21) TBPK tablet Take 6 tabs by mouth daily  for 2 days, then 5 tabs for 2 days, then 4 tabs for 2 days, then 3 tabs for 2 days, 2 tabs for 2 days, then 1 tab by mouth daily for 2 days 05/28/17  Yes Joy, Shawn C, PA-C  aspirin EC 81 MG tablet Take 1 tablet (81 mg total) by mouth daily. Patient not taking: Reported on 06/05/2017 11/12/16   Rosalio Macadamia,  NP  diclofenac (VOLTAREN) 75 MG EC tablet Take 1 tablet (75 mg total) by mouth 2 (two) times daily. 06/05/17   Vivianne Master, PA-C  methocarbamol (ROBAXIN) 500 MG tablet Take 1 tablet (500 mg total) by mouth 2 (two) times daily. 05/28/17   Joy, Shawn C, PA-C  Norethindrone Acetate-Ethinyl Estrad-FE (LOESTRIN 24 FE) 1-20 MG-MCG(24) tablet Take 1 tablet by mouth daily. Patient not taking: Reported on 06/05/2017 01/29/17   Levie Heritage, DO  norgestimate-ethinyl estradiol (ORTHO-CYCLEN,SPRINTEC,PREVIFEM) 0.25-35  MG-MCG tablet Take 1 tablet by mouth daily. Patient not taking: Reported on 06/05/2017 02/04/17   Katrinka Blazing, [place], CNM  pantoprazole (PROTONIX) 40 MG tablet Take 1 tablet (40 mg total) by mouth daily. Patient not taking: Reported on 06/05/2017 10/20/16   Pete Glatter, MD  traMADol (ULTRAM) 50 MG tablet Take 1 tablet (50 mg total) by mouth every 8 (eight) hours as needed. 06/05/17   Vivianne Master, PA-C    Assessment: 1. Acute on chronic low back pain with left sciatica 2. Obesity   Plan: Refill Robaxin and Voltaren Tramadol Physical therapy Weight loss encouraged May need ortho referral at some point  Follow up:3 weeks  The patient was given clear instructions to go to ER or return to medical center if symptoms don't improve, worsen or new problems develop. The patient verbalized understanding. The patient was told to call to get lab results if they haven't heard anything in the next week.   This note has been created with Education officer, environmental. Any transcriptional errors are unintentional.   Scot Jun, PA-C 06/05/2017, 11:35 AM

## 2017-08-13 ENCOUNTER — Other Ambulatory Visit: Payer: Self-pay | Admitting: Physician Assistant

## 2017-08-13 MED FILL — METHOCARBAMOL 500 MG TABS: 500 | 30 days supply | Qty: 60 | Fill #1

## 2017-08-13 MED FILL — LEVOTHYROXINE 50 MCG TABLET: 50 | 30 days supply | Qty: 30 | Fill #4

## 2017-08-14 ENCOUNTER — Other Ambulatory Visit: Payer: Self-pay | Admitting: Physician Assistant

## 2017-10-13 DIAGNOSIS — K5792 Diverticulitis of intestine, part unspecified, without perforation or abscess without bleeding: Secondary | ICD-10-CM

## 2017-10-13 HISTORY — DX: Diverticulitis of intestine, part unspecified, without perforation or abscess without bleeding: K57.92

## 2017-10-29 ENCOUNTER — Other Ambulatory Visit: Payer: Self-pay

## 2017-10-29 ENCOUNTER — Emergency Department (HOSPITAL_COMMUNITY): Payer: Self-pay

## 2017-10-29 ENCOUNTER — Emergency Department (HOSPITAL_COMMUNITY)
Admission: EM | Admit: 2017-10-29 | Discharge: 2017-10-29 | Disposition: A | Discharge status: Home / Self Care | Payer: Self-pay | Attending: Emergency Medicine | Admitting: Emergency Medicine

## 2017-10-29 ENCOUNTER — Encounter (HOSPITAL_COMMUNITY): Payer: Self-pay

## 2017-10-29 DIAGNOSIS — Z79899 Other long term (current) drug therapy: Secondary | ICD-10-CM | POA: Insufficient documentation

## 2017-10-29 DIAGNOSIS — Z87891 Personal history of nicotine dependence: Secondary | ICD-10-CM | POA: Insufficient documentation

## 2017-10-29 DIAGNOSIS — M545 Low back pain: Secondary | ICD-10-CM | POA: Insufficient documentation

## 2017-10-29 DIAGNOSIS — E039 Hypothyroidism, unspecified: Secondary | ICD-10-CM | POA: Insufficient documentation

## 2017-10-29 DIAGNOSIS — Z7982 Long term (current) use of aspirin: Secondary | ICD-10-CM | POA: Insufficient documentation

## 2017-10-29 DIAGNOSIS — J45909 Unspecified asthma, uncomplicated: Secondary | ICD-10-CM | POA: Insufficient documentation

## 2017-10-29 DIAGNOSIS — M5440 Lumbago with sciatica, unspecified side: Secondary | ICD-10-CM

## 2017-10-29 DIAGNOSIS — I10 Essential (primary) hypertension: Secondary | ICD-10-CM | POA: Insufficient documentation

## 2017-10-29 LAB — POC URINE PREG, ED: Preg Test, Ur: NEGATIVE

## 2017-10-29 MED ORDER — DIAZEPAM 5 MG PO TABS
5.0000 mg | ORAL_TABLET | Freq: Once | ORAL | Status: AC
  Administered 2017-10-29: 5 mg via ORAL
  Filled 2017-10-29: qty 1

## 2017-10-29 MED ORDER — HYDROMORPHONE HCL 1 MG/ML IJ SOLN
1.0000 mg | Freq: Once | INTRAMUSCULAR | Status: AC
  Administered 2017-10-29: 1 mg via INTRAMUSCULAR
  Filled 2017-10-29: qty 1

## 2017-10-29 MED ORDER — DIAZEPAM 2 MG PO TABS: 2.0000 mg | ORAL_TABLET | ORAL | 0 refills | Status: DC | PRN

## 2017-10-29 MED ORDER — OXYCODONE-ACETAMINOPHEN 5-325 MG PO TABS: 1.0000 | ORAL_TABLET | ORAL | 0 refills | Status: DC | PRN

## 2017-10-29 MED ORDER — KETOROLAC TROMETHAMINE 60 MG/2ML IM SOLN
30.0000 mg | Freq: Once | INTRAMUSCULAR | Status: AC
  Administered 2017-10-29: 60 mg via INTRAMUSCULAR
  Filled 2017-10-29: qty 2

## 2017-10-29 MED ORDER — PREDNISONE 10 MG (21) PO TBPK: ORAL_TABLET | Freq: Every day | ORAL | 0 refills | Status: DC

## 2017-10-29 NOTE — ED Triage Notes (Signed)
Pt brought in by EMS for c/o lower back pain for the past 4 days  Pt has hx of sciatica and back spasms but this feels different  Pt states she has difficulty walking at time Pt was ambulatory to EMS

## 2017-10-29 NOTE — ED Provider Notes (Signed)
[facility]-EMERGENCY DEPT Provider Note   CSN: [id number] Arrival date & time: 10/29/17  4098     History   Chief Complaint Chief Complaint  Patient presents with  . Back Pain    HPI Lori Mcknight is a 32 y.o. female.  32 year old female presents with 4 days of persistent bilateral lower lumbar paraspinal muscle pain.  Pain is sharp and worse with any movement and became worse this morning when she bent over to pick something up.  No bowel or bladder dysfunction.  Denies any foot drop.  Better with remaining still.  Called EMS and was transported here and was noted to be able to ambulate with them.      Past Medical History:  Diagnosis Date  . Anemia    history of anemia  . Asthma   . Chest pain of uncertain etiology    intermittent left side chest pain  . Chlamydia   . Genital herpes   . Hypertension    no meds since April  . Morbid obesity (HCC)   . Obese   . PCOS (polycystic ovarian syndrome)   . Shortness of breath    on exertion from weight  . Thyroid disease   . Urinary tract infection     Patient Active Problem List   Diagnosis Date Noted  . Hypothyroid 01/29/2017  . Migraines 08/26/2016  . Chest pain 02/04/2016  . Abnormal uterine bleeding (AUB) 01/09/2014  . Endometrial thickening on ultra sound 12/02/2013  . Hypertension 10/21/2013  . PCOS (polycystic ovarian syndrome) 11/06/2011  . Obesity, Class III, BMI 40-49.9 (morbid obesity) (HCC) 11/06/2011  . Infertility, female 11/06/2011  . Pelvic pain in female 08/01/2011    Past Surgical History:  Procedure Laterality Date  . DILATION AND CURETTAGE OF UTERUS    . HYSTEROSCOPY W/D&C N/A 04/06/2014   Procedure: DILATATION AND CURETTAGE /HYSTEROSCOPY ;  Surgeon: Tereso Newcomer, MD;  Location: [facility];  Service: Gynecology;  Laterality: N/A;  . LEFT HEART CATH AND CORONARY ANGIOGRAPHY N/A 12/12/2016   Procedure: Left Heart Cath and Coronary Angiography;  Surgeon: Peter M Swaziland, MD;   Location: [facility] INVASIVE CV LAB;  Service: Cardiovascular;  Laterality: N/A;  . NO PAST SURGERIES    . WISDOM TOOTH EXTRACTION      OB History    Gravida Para Term Preterm AB Living   0             SAB TAB Ectopic Multiple Live Births                   Home Medications    Prior to Admission medications   Medication Sig Start Date End Date Taking? Authorizing Provider  albuterol (PROVENTIL) (2.5 MG/3ML) 0.083% nebulizer solution Take 6 mLs (5 mg total) by nebulization every 6 (six) hours as needed for wheezing or shortness of breath. 01/06/17   Garlon Hatchet, PA-C  aspirin EC 81 MG tablet Take 1 tablet (81 mg total) by mouth daily. Patient not taking: Reported on 06/05/2017 11/12/16   Rosalio Macadamia, NP  beclomethasone (QVAR) 40 MCG/ACT inhaler Inhale 2 puffs into the lungs 2 (two) times daily as needed (sob). Patient taking differently: Inhale 2 puffs into the lungs 2 (two) times daily as needed (shortness of breath).  06/23/16   Pete Glatter, MD  diclofenac (VOLTAREN) 75 MG EC tablet Take 1 tablet (75 mg total) by mouth 2 (two) times daily. 06/05/17   Vivianne Master, PA-C  hydrochlorothiazide (HYDRODIURIL) 25 MG tablet Take 1 tablet (25 mg total) by mouth daily. Must have office visit for refills Patient taking differently: Take 50 mg by mouth daily. Must have office visit for refills 10/20/16   Dierdre Searles T, MD  ibuprofen (ADVIL,MOTRIN) 600 MG tablet Take 1 tablet (600 mg total) by mouth every 6 (six) hours as needed. 05/28/17   Joy, Shawn C, PA-C  levothyroxine (SYNTHROID, LEVOTHROID) 50 MCG tablet Take 1 tablet (50 mcg total) by mouth daily. 10/21/16   Langeland, Dawn T, MD  lidocaine (LIDODERM) 5 % Place 1 patch onto the skin daily. Remove & Discard patch within 12 hours or as directed by MD 05/28/17   Joy, Shawn C, PA-C  methocarbamol (ROBAXIN) 500 MG tablet Take 1 tablet (500 mg total) by mouth 2 (two) times daily. 05/28/17   Joy, Shawn C, PA-C  methocarbamol (ROBAXIN) 500 MG  tablet Take 1 tablet (500 mg total) by mouth 2 (two) times daily. 06/05/17   Vivianne Master, PA-C  metoprolol tartrate (LOPRESSOR) 50 MG tablet Take 1 tablet (50 mg total) by mouth 2 (two) times daily. 03/10/17   Rosalio Macadamia, NP  Norethindrone Acetate-Ethinyl Estrad-FE (LOESTRIN 24 FE) 1-20 MG-MCG(24) tablet Take 1 tablet by mouth daily. Patient not taking: Reported on 06/05/2017 01/29/17   Levie Heritage, DO  norgestimate-ethinyl estradiol (ORTHO-CYCLEN,SPRINTEC,PREVIFEM) 0.25-35 MG-MCG tablet Take 1 tablet by mouth daily. Patient not taking: Reported on 06/05/2017 02/04/17   Katrinka Blazing, [place], CNM  pantoprazole (PROTONIX) 40 MG tablet Take 1 tablet (40 mg total) by mouth daily. Patient not taking: Reported on 06/05/2017 10/20/16   Pete Glatter, MD  predniSONE (STERAPRED UNI-PAK 21 TAB) 10 MG (21) TBPK tablet Take 6 tabs by mouth daily  for 2 days, then 5 tabs for 2 days, then 4 tabs for 2 days, then 3 tabs for 2 days, 2 tabs for 2 days, then 1 tab by mouth daily for 2 days 05/28/17   Joy, Shawn C, PA-C  traMADol (ULTRAM) 50 MG tablet Take 1 tablet (50 mg total) by mouth every 8 (eight) hours as needed. 06/05/17   Vivianne Master, PA-C    Family History Family History  Problem Relation Age of Onset  . Heart disease Mother   . Sleep apnea Mother   . Diabetes Father   . Heart disease Father   . Heart disease Maternal Grandmother   . Diabetes Maternal Grandfather   . Heart disease Maternal Grandfather     Social History Social History   Tobacco Use  . Smoking status: Former Smoker    Last attempt to quit: 01/04/2011    Years since quitting: 6.8  . Smokeless tobacco: Never Used  . Tobacco comment: quit 2009  Substance Use Topics  . Alcohol use: Yes    Alcohol/week: 0.0 oz    Comment: occasonal   . Drug use: No     Allergies   Patient has no known allergies.   Review of Systems Review of Systems  All other systems reviewed and are negative.    Physical Exam Updated  Vital Signs BP (!) 147/84 (BP Location: Right Arm)   Pulse 83   Temp 98.1 F (36.7 C) (Oral)   Resp 18   Ht 1.676 m (5\' 6" )   Wt (!) 166 kg (366 lb)   SpO2 98%   BMI 59.07 kg/m   Physical Exam  Constitutional: She is oriented to person, place, and time. She appears well-developed and well-nourished.  Non-toxic  appearance. No distress.  HENT:  Head: Normocephalic and atraumatic.  Eyes: Conjunctivae, EOM and lids are normal. Pupils are equal, round, and reactive to light.  Neck: Normal range of motion. Neck supple. No tracheal deviation present. No thyroid mass present.  Cardiovascular: Normal rate, regular rhythm and normal heart sounds. Exam reveals no gallop.  No murmur heard. Pulmonary/Chest: Effort normal and breath sounds normal. No stridor. No respiratory distress. She has no decreased breath sounds. She has no wheezes. She has no rhonchi. She has no rales.  Abdominal: Soft. Normal appearance and bowel sounds are normal. She exhibits no distension. There is no tenderness. There is no rebound and no CVA tenderness.  Musculoskeletal: Normal range of motion. She exhibits no edema or tenderness.       Back:  Neurological: She is alert and oriented to person, place, and time. She has normal strength. No cranial nerve deficit or sensory deficit. GCS eye subscore is 4. GCS verbal subscore is 5. GCS motor subscore is 6.  Reflex Scores:      Patellar reflexes are 2+ on the right side and 2+ on the left side. Skin: Skin is warm and dry. No abrasion and no rash noted.  Psychiatric: She has a normal mood and affect. Her speech is normal and behavior is normal.  Nursing note and vitals reviewed.    ED Treatments / Results  Labs (all labs ordered are listed, but only abnormal results are displayed) Labs Reviewed - No data to display  EKG  EKG Interpretation None       Radiology No results found.  Procedures Procedures (including critical care time)  Medications Ordered in  ED Medications  diazepam (VALIUM) tablet 5 mg (not administered)  HYDROmorphone (DILAUDID) injection 1 mg (not administered)  ketorolac (TORADOL) injection 30 mg (not administered)     Initial Impression / Assessment and Plan / ED Course  I have reviewed the triage vital signs and the nursing notes.  Pertinent labs & imaging results that were available during my care of the patient were reviewed by me and considered in my medical decision making (see chart for details).     Patient treated for pain here and feels better.  X-rays negative.  Will discharge home  Final Clinical Impressions(s) / ED Diagnoses   Final diagnoses:  None    ED Discharge Orders    None       Lorre NickAllen, Juell Radney, MD 10/29/17 1411

## 2017-10-29 NOTE — ED Notes (Signed)
ED Provider at bedside. 

## 2017-12-29 ENCOUNTER — Telehealth: Payer: Self-pay | Admitting: General Practice

## 2017-12-29 MED ORDER — LEVOTHYROXINE SODIUM 50 MCG PO TABS: 50.0000 ug | ORAL_TABLET | Freq: Every day | ORAL | 0 refills | Status: DC

## 2017-12-29 MED FILL — LEVOTHYROXINE 50 MCG TABLET: 50 | 30 days supply | Qty: 30 | Fill #0

## 2017-12-29 NOTE — Telephone Encounter (Signed)
Pt. Called requesting an appt. For her Thyroid and pain medication. Pt. Has scheduled an appt. For 01/19/18 to est care. Pt. Uses [facility] pharmacy.

## 2017-12-29 NOTE — Telephone Encounter (Signed)
I have refilled the thyroid medication x 30 days to last until next appt. I will not refill any pain medication, will forward to provider who she is establishing care with.

## 2018-01-07 NOTE — Telephone Encounter (Signed)
Denied. I do not prescribe oxycodone or valium. I will not prescribe even at her next office appointment with me. Please make sure she is aware.

## 2018-01-08 NOTE — Telephone Encounter (Signed)
Called pt. And LVM informing her of her tyroid medication being refilled, also about PCP not prescribing her any pain medication.

## 2018-01-14 ENCOUNTER — Other Ambulatory Visit: Payer: Self-pay

## 2018-01-14 ENCOUNTER — Inpatient Hospital Stay (HOSPITAL_COMMUNITY)
Admission: AD | Admit: 2018-01-14 | Discharge: 2018-01-14 | Disposition: A | Discharge status: Home / Self Care | Payer: Self-pay | Source: Ambulatory Visit | Attending: Obstetrics and Gynecology | Admitting: Obstetrics and Gynecology

## 2018-01-14 ENCOUNTER — Encounter (HOSPITAL_COMMUNITY): Payer: Self-pay

## 2018-01-14 ENCOUNTER — Inpatient Hospital Stay (HOSPITAL_COMMUNITY): Payer: Self-pay

## 2018-01-14 DIAGNOSIS — Z7982 Long term (current) use of aspirin: Secondary | ICD-10-CM | POA: Insufficient documentation

## 2018-01-14 DIAGNOSIS — N939 Abnormal uterine and vaginal bleeding, unspecified: Secondary | ICD-10-CM

## 2018-01-14 DIAGNOSIS — N920 Excessive and frequent menstruation with regular cycle: Secondary | ICD-10-CM | POA: Insufficient documentation

## 2018-01-14 DIAGNOSIS — R109 Unspecified abdominal pain: Secondary | ICD-10-CM

## 2018-01-14 DIAGNOSIS — Z87891 Personal history of nicotine dependence: Secondary | ICD-10-CM | POA: Insufficient documentation

## 2018-01-14 LAB — URINALYSIS, ROUTINE W REFLEX MICROSCOPIC
Bilirubin Urine: NEGATIVE
Glucose, UA: NEGATIVE mg/dL
Ketones, ur: NEGATIVE mg/dL
Leukocytes, UA: NEGATIVE
Nitrite: NEGATIVE
PROTEIN: NEGATIVE mg/dL
SPECIFIC GRAVITY, URINE: 1.02 (ref 1.005–1.030)
pH: 6 (ref 5.0–8.0)

## 2018-01-14 LAB — CBC
HEMATOCRIT: 35.8 % — AB (ref 36.0–46.0)
HEMOGLOBIN: 11.2 g/dL — AB (ref 12.0–15.0)
MCH: 27.3 pg (ref 26.0–34.0)
MCHC: 31.3 g/dL (ref 30.0–36.0)
MCV: 87.1 fL (ref 78.0–100.0)
Platelets: 247 10*3/uL (ref 150–400)
RBC: 4.11 MIL/uL (ref 3.87–5.11)
RDW: 14.3 % (ref 11.5–15.5)
WBC: 7.9 10*3/uL (ref 4.0–10.5)

## 2018-01-14 LAB — POCT PREGNANCY, URINE: PREG TEST UR: NEGATIVE

## 2018-01-14 MED ORDER — TRAMADOL HCL 50 MG PO TABS: 50.0000 mg | ORAL_TABLET | Freq: Four times a day (QID) | ORAL | 0 refills | Status: DC | PRN

## 2018-01-14 MED ORDER — KETOROLAC TROMETHAMINE 60 MG/2ML IM SOLN
60.0000 mg | Freq: Once | INTRAMUSCULAR | Status: AC
  Administered 2018-01-14: 60 mg via INTRAMUSCULAR
  Filled 2018-01-14: qty 2

## 2018-01-14 MED ORDER — TRAMADOL HCL 50 MG PO TABS
100.0000 mg | ORAL_TABLET | Freq: Once | ORAL | Status: AC
  Administered 2018-01-14: 100 mg via ORAL
  Filled 2018-01-14: qty 2

## 2018-01-14 MED ORDER — MEGESTROL ACETATE 40 MG PO TABS: ORAL_TABLET | ORAL | 1 refills | Status: DC

## 2018-01-14 MED ORDER — ONDANSETRON 8 MG PO TBDP: 8.0000 mg | ORAL_TABLET | Freq: Once | ORAL | Status: DC

## 2018-01-14 NOTE — MAU Provider Note (Addendum)
History     CSN: [id number]  Arrival date and time: 01/14/18 1155   First Provider Initiated Contact with Patient 01/14/18 1446      Chief Complaint  Patient presents with  . Vaginal Bleeding  . Abdominal Pain   HPI   Ms.Marland KitchenSey[place] C Mcknight is a 32 y.o. female G0P0 here in MAU with abnormal vaginal bleeding. She has a history of PCOS. She was in the office recently and was prescribed birth control pills to help manage her heavy periods. This period started back in February. Says she has been having off and on bleeding since then. She is taking ibuprofen 800 mg 2-3 times per day. Says it is not helping the pain at all. + significant pelvic pressure.   OB History    Gravida  0   Para      Term      Preterm      AB      Living        SAB      TAB      Ectopic      Multiple      Live Births              Past Medical History:  Diagnosis Date  . Anemia    history of anemia  . Asthma    inhaler as needed  . Chest pain of uncertain etiology    intermittent left side chest pain  . Chlamydia   . Genital herpes   . Hypertension    no meds since April. needs new px per pt   . Morbid obesity (HCC)   . Obese   . PCOS (polycystic ovarian syndrome)   . Shortness of breath    on exertion from weight  . Thyroid disease   . Urinary tract infection     Past Surgical History:  Procedure Laterality Date  . DILATION AND CURETTAGE OF UTERUS    . HYSTEROSCOPY W/D&C N/A 04/06/2014   Procedure: DILATATION AND CURETTAGE /HYSTEROSCOPY ;  Surgeon: Tereso Newcomer, MD;  Location: [facility];  Service: Gynecology;  Laterality: N/A;  . LEFT HEART CATH AND CORONARY ANGIOGRAPHY N/A 12/12/2016   Procedure: Left Heart Cath and Coronary Angiography;  Surgeon: Peter M Swaziland, MD;  Location: [facility] INVASIVE CV LAB;  Service: Cardiovascular;  Laterality: N/A;  . NO PAST SURGERIES    . WISDOM TOOTH EXTRACTION      Family History  Problem Relation Age of Onset  . Heart disease Mother   . Sleep  apnea Mother   . Diabetes Father   . Heart disease Father   . Heart disease Maternal Grandmother   . Diabetes Maternal Grandfather   . Heart disease Maternal Grandfather     Social History   Tobacco Use  . Smoking status: Former Smoker    Last attempt to quit: 01/04/2011    Years since quitting: 7.0  . Smokeless tobacco: Never Used  . Tobacco comment: quit 2009  Substance Use Topics  . Alcohol use: Yes    Alcohol/week: 0.0 oz    Comment: occasonal   . Drug use: No    Allergies: No Known Allergies  Medications Prior to Admission  Medication Sig Dispense Refill Last Dose  . levothyroxine (SYNTHROID, LEVOTHROID) 50 MCG tablet Take 1 tablet (50 mcg total) by mouth daily. 30 tablet 0 01/14/2018 at Unknown time  . metoprolol tartrate (LOPRESSOR) 50 MG tablet Take 1 tablet (50 mg total) by mouth 2 (two) times  daily. 180 tablet 3 Past Week at Unknown time  . aspirin EC 81 MG tablet Take 1 tablet (81 mg total) by mouth daily. (Patient not taking: Reported on 06/05/2017) 90 tablet 3 Not Taking at Unknown time  . hydrochlorothiazide (HYDRODIURIL) 25 MG tablet Take 1 tablet (25 mg total) by mouth daily. Must have office visit for refills (Patient not taking: Reported on 01/14/2018) 90 tablet 3 Not Taking at Unknown time  . ibuprofen (ADVIL,MOTRIN) 600 MG tablet Take 1 tablet (600 mg total) by mouth every 6 (six) hours as needed. (Patient not taking: Reported on 01/14/2018) 30 tablet 0 Not Taking at Unknown time  . lidocaine (LIDODERM) 5 % Place 1 patch onto the skin daily. Remove & Discard patch within 12 hours or as directed by MD (Patient not taking: Reported on 10/29/2017) 30 patch 0 Not Taking at Unknown time  . methocarbamol (ROBAXIN) 500 MG tablet Take 1 tablet (500 mg total) by mouth 2 (two) times daily. (Patient not taking: Reported on 01/14/2018) 20 tablet 0 Not Taking at Unknown time  . methocarbamol (ROBAXIN) 500 MG tablet Take 1 tablet (500 mg total) by mouth 2 (two) times daily. 60 tablet 1    . Norethindrone Acetate-Ethinyl Estrad-FE (LOESTRIN 24 FE) 1-20 MG-MCG(24) tablet Take 1 tablet by mouth daily. (Patient not taking: Reported on 06/05/2017) 1 Package 11 Not Taking at Unknown time  . norgestimate-ethinyl estradiol (ORTHO-CYCLEN,SPRINTEC,PREVIFEM) 0.25-35 MG-MCG tablet Take 1 tablet by mouth daily. (Patient not taking: Reported on 06/05/2017) 1 Package 11 Not Taking at Unknown time  . oxyCODONE-acetaminophen (PERCOCET/ROXICET) 5-325 MG tablet Take 1-2 tablets by mouth every 4 (four) hours as needed for severe pain. (Patient not taking: Reported on 01/14/2018) 15 tablet 0 Not Taking at Unknown time  . pantoprazole (PROTONIX) 40 MG tablet Take 1 tablet (40 mg total) by mouth daily. (Patient not taking: Reported on 06/05/2017) 30 tablet 3 Not Taking at Unknown time  . predniSONE (STERAPRED UNI-PAK 21 TAB) 10 MG (21) TBPK tablet Take 6 tabs by mouth daily  for 2 days, then 5 tabs for 2 days, then 4 tabs for 2 days, then 3 tabs for 2 days, 2 tabs for 2 days, then 1 tab by mouth daily for 2 days (Patient not taking: Reported on 10/29/2017) 42 tablet 0 Not Taking at Unknown time  . predniSONE (STERAPRED UNI-PAK 21 TAB) 10 MG (21) TBPK tablet Take by mouth daily. Take 6 tabs by mouth daily  for 2 days, then 5 tabs for 2 days, then 4 tabs for 2 days, then 3 tabs for 2 days, 2 tabs for 2 days, then 1 tab by mouth daily for 2 days (Patient not taking: Reported on 01/14/2018) 42 tablet 0 Not Taking at Unknown time  . traMADol (ULTRAM) 50 MG tablet Take 1 tablet (50 mg total) by mouth every 8 (eight) hours as needed. (Patient not taking: Reported on 10/29/2017) 30 tablet 0 Not Taking at Unknown time   Results for orders placed or performed during the hospital encounter of 01/14/18 (from the past 48 hour(s))  Urinalysis, Routine w reflex microscopic     Status: Abnormal   Collection Time: 01/14/18 12:30 PM  Result Value Ref Range   Color, Urine YELLOW YELLOW   APPearance CLEAR CLEAR   Specific Gravity,  Urine 1.020 1.005 - 1.030   pH 6.0 5.0 - 8.0   Glucose, UA NEGATIVE NEGATIVE mg/dL   Hgb urine dipstick SMALL (A) NEGATIVE   Bilirubin Urine NEGATIVE NEGATIVE   Ketones,  ur NEGATIVE NEGATIVE mg/dL   Protein, ur NEGATIVE NEGATIVE mg/dL   Nitrite NEGATIVE NEGATIVE   Leukocytes, UA NEGATIVE NEGATIVE   RBC / HPF 0-5 0 - 5 RBC/hpf   WBC, UA 0-5 0 - 5 WBC/hpf   Bacteria, UA RARE (A) NONE SEEN   Squamous Epithelial / LPF 0-5 (A) NONE SEEN   Mucus PRESENT     Comment: Performed at [facility], [address] [place], [place] [address]  Pregnancy, urine POC     Status: None   Collection Time: 01/14/18  2:25 PM  Result Value Ref Range   Preg Test, Ur NEGATIVE NEGATIVE    Comment:        THE SENSITIVITY OF THIS METHODOLOGY IS >24 mIU/mL   CBC     Status: Abnormal   Collection Time: 01/14/18  2:42 PM  Result Value Ref Range   WBC 7.9 4.0 - 10.5 K/uL   RBC 4.11 3.87 - 5.11 MIL/uL   Hemoglobin 11.2 (L) 12.0 - 15.0 g/dL   HCT 60.435.8 (L) 54.036.0 - 98.146.0 %   MCV 87.1 78.0 - 100.0 fL   MCH 27.3 26.0 - 34.0 pg   MCHC 31.3 30.0 - 36.0 g/dL   RDW 19.114.3 47.811.5 - 29.515.5 %   Platelets 247 150 - 400 K/uL    Comment: Performed at [facility], [address] [place], [place] [address]    Review of Systems  Constitutional: Negative for fever.  Genitourinary: Positive for vaginal bleeding.  Neurological: Positive for dizziness.   Physical Exam   Blood pressure (!) 136/92, pulse 84, temperature 98.4 F (36.9 C), temperature source Oral, resp. rate 18, height 5\' 6"  (1.676 m), weight (!) 364 lb (165.1 kg), last menstrual period 12/28/2017.  Physical Exam  Constitutional: She is oriented to person, place, and time. She appears well-developed and well-nourished. No distress.  HENT:  Head: Normocephalic.  Eyes: Pupils are equal, round, and reactive to light.  Cardiovascular: Normal rate.  Respiratory: Effort normal.  Genitourinary:  Genitourinary Comments: Bimanual exam: Cervix closed, cervix  feels long and near the vaginal opening.  Uterus non tender, normal size Adnexa difficult to assess due to body habitus.  Chaperone present for exam.   Musculoskeletal: Normal range of motion.  Neurological: She is alert and oriented to person, place, and time.  Skin: Skin is warm. She is not diaphoretic.  Psychiatric: Her behavior is normal.    MAU Course  Procedures  CLINICAL DATA:  On all bleeding since February.  Pelvic pressure.  EXAM: TRANSABDOMINAL AND TRANSVAGINAL ULTRASOUND OF PELVIS  TECHNIQUE: Both transabdominal and transvaginal ultrasound examinations of the pelvis were performed. Transabdominal technique was performed for global imaging of the pelvis including uterus, ovaries, adnexal regions, and pelvic cul-de-sac. It was necessary to proceed with endovaginal exam following the transabdominal exam to visualize the endometrium and ovaries.  COMPARISON:  None  FINDINGS: Uterus  Measurements: 9.2 x 5.4 x 6 cm. No fibroids or other mass visualized.  Endometrium  Thickness: 36.2 mm. Heterogeneous hypervascular endometrium without focal mass lesion.  Right ovary  Measurements: 3.5 x 1.7 x 2.8 cm. Normal appearance/no adnexal mass.  Left ovary  Measurements: 3.4 x 1.5 x 2.5 cm. Normal appearance/no adnexal mass.  Other findings  Trace pelvic free fluid.  IMPRESSION: 1. Abnormal uterine thickening measuring 36.2 mm. If bleeding remains unresponsive to hormonal or medical therapy, focal lesion work-up with sonohysterogram should be considered. Endometrial biopsy should also be considered in pre-menopausal patients at high risk for  endometrial carcinoma. (Ref: Radiological Reasoning: Algorithmic Workup of Abnormal Vaginal Bleeding with Endovaginal Sonography and Sonohysterography. AJR 2008; 161:W96-04)   Results for orders placed or performed during the hospital encounter of 01/14/18 (from the past 24 hour(s))  Urinalysis, Routine w  reflex microscopic     Status: Abnormal   Collection Time: 01/14/18 12:30 PM  Result Value Ref Range   Color, Urine YELLOW YELLOW   APPearance CLEAR CLEAR   Specific Gravity, Urine 1.020 1.005 - 1.030   pH 6.0 5.0 - 8.0   Glucose, UA NEGATIVE NEGATIVE mg/dL   Hgb urine dipstick SMALL (A) NEGATIVE   Bilirubin Urine NEGATIVE NEGATIVE   Ketones, ur NEGATIVE NEGATIVE mg/dL   Protein, ur NEGATIVE NEGATIVE mg/dL   Nitrite NEGATIVE NEGATIVE   Leukocytes, UA NEGATIVE NEGATIVE   RBC / HPF 0-5 0 - 5 RBC/hpf   WBC, UA 0-5 0 - 5 WBC/hpf   Bacteria, UA RARE (A) NONE SEEN   Squamous Epithelial / LPF 0-5 (A) NONE SEEN   Mucus PRESENT   Pregnancy, urine POC     Status: None   Collection Time: 01/14/18  2:25 PM  Result Value Ref Range   Preg Test, Ur NEGATIVE NEGATIVE  CBC     Status: Abnormal   Collection Time: 01/14/18  2:42 PM  Result Value Ref Range   WBC 7.9 4.0 - 10.5 K/uL   RBC 4.11 3.87 - 5.11 MIL/uL   Hemoglobin 11.2 (L) 12.0 - 15.0 g/dL   HCT 54.0 (L) 98.1 - 19.1 %   MCV 87.1 78.0 - 100.0 fL   MCH 27.3 26.0 - 34.0 pg   MCHC 31.3 30.0 - 36.0 g/dL   RDW 47.8 29.5 - 62.1 %   Platelets 247 150 - 400 K/uL     MDM CLINICAL DATA:  On all bleeding since February.  Pelvic pressure.  EXAM: TRANSABDOMINAL AND TRANSVAGINAL ULTRASOUND OF PELVIS  TECHNIQUE: Both transabdominal and transvaginal ultrasound examinations of the pelvis were performed. Transabdominal technique was performed for global imaging of the pelvis including uterus, ovaries, adnexal regions, and pelvic cul-de-sac. It was necessary to proceed with endovaginal exam following the transabdominal exam to visualize the endometrium and ovaries.  COMPARISON:  None  FINDINGS: Uterus  Measurements: 9.2 x 5.4 x 6 cm. No fibroids or other mass visualized.  Endometrium  Thickness: 36.2 mm. Heterogeneous hypervascular endometrium without focal mass lesion.  Right ovary  Measurements: 3.5 x 1.7 x 2.8 cm.  Normal appearance/no adnexal mass.  Left ovary  Measurements: 3.4 x 1.5 x 2.5 cm. Normal appearance/no adnexal mass.  Other findings  Trace pelvic free fluid.  IMPRESSION: 1. Abnormal uterine thickening measuring 36.2 mm. If bleeding remains unresponsive to hormonal or medical therapy, focal lesion work-up with sonohysterogram should be considered. Endometrial biopsy should also be considered in pre-menopausal patients at high risk for endometrial carcinoma. (Ref: Radiological Reasoning: Algorithmic Workup of Abnormal Vaginal Bleeding with Endovaginal Sonography and Sonohysterography. AJR 2008; 308:M57-84)  Ultram 2 tablets, and toradol 60 mg IM given. Patient down from 7/10 to 5/10 CBC, UA Report given to Nolene Bernheim NP who resumes care of the patient.  Rasch, Harolyn Rutherford, NP Consult with Dr. Erin Fulling re: plan of care  Assessment and Plan  Abnormal vaginal bleeding - contiuous vaginal bleeding since Feb 12 - various amounts - has a thickened endometrial lining but no other abnormalities found Blood pressure borderline  Plan Will treat with Megace to help control the bleeding and client needs to follow up in  the GYN clinic for further management of abnormal vaginal bleeding.  She plans to call and schedule an appointment with MD in clinic. Plans to see MD at [facility] on April 9  - encouraged to keep the appointment.   Nolene Bernheim, RN, MSN, NP-BC Nurse Practitioner, [facility] for [employer], [facility] 01/14/2018 5:22 PM

## 2018-01-14 NOTE — MAU Note (Signed)
Pt reports she had been bleeding for 18 days with large clots. C/o ovarian pain and cramping.

## 2018-01-14 NOTE — Progress Notes (Addendum)
Nonpregnant. Here dt bleeding with large clots for over 18 days and abdominal pain. Denies being pregnant.   UPT negative  1442: Lab at bs.   1446: provider at bs assessing.   1530: pt states medication helped "little bit" with her abdominal pain.  1700: provider at bs. VE done. Ordered for U/S  1747: pt to U/S via wheelchair.

## 2018-01-14 NOTE — Discharge Instructions (Signed)
Call the clinic and make an appointmetn for an endometrial biopsy Keep your appointment at [facility].

## 2018-01-19 ENCOUNTER — Ambulatory Visit: Payer: Self-pay | Attending: Nurse Practitioner | Admitting: Nurse Practitioner

## 2018-01-19 ENCOUNTER — Encounter: Payer: Self-pay | Admitting: Nurse Practitioner

## 2018-01-19 VITALS — BP 115/78 | HR 82 | Temp 98.7°F | Ht 66.0 in | Wt 363.6 lb

## 2018-01-19 DIAGNOSIS — Z7689 Persons encountering health services in other specified circumstances: Secondary | ICD-10-CM | POA: Insufficient documentation

## 2018-01-19 DIAGNOSIS — J45909 Unspecified asthma, uncomplicated: Secondary | ICD-10-CM | POA: Insufficient documentation

## 2018-01-19 DIAGNOSIS — I10 Essential (primary) hypertension: Secondary | ICD-10-CM | POA: Insufficient documentation

## 2018-01-19 DIAGNOSIS — Z6841 Body Mass Index (BMI) 40.0 and over, adult: Secondary | ICD-10-CM | POA: Insufficient documentation

## 2018-01-19 DIAGNOSIS — M5442 Lumbago with sciatica, left side: Secondary | ICD-10-CM | POA: Insufficient documentation

## 2018-01-19 DIAGNOSIS — Z79890 Hormone replacement therapy (postmenopausal): Secondary | ICD-10-CM | POA: Insufficient documentation

## 2018-01-19 DIAGNOSIS — Z79899 Other long term (current) drug therapy: Secondary | ICD-10-CM | POA: Insufficient documentation

## 2018-01-19 DIAGNOSIS — M5441 Lumbago with sciatica, right side: Secondary | ICD-10-CM | POA: Insufficient documentation

## 2018-01-19 DIAGNOSIS — N898 Other specified noninflammatory disorders of vagina: Secondary | ICD-10-CM | POA: Insufficient documentation

## 2018-01-19 DIAGNOSIS — E039 Hypothyroidism, unspecified: Secondary | ICD-10-CM | POA: Insufficient documentation

## 2018-01-19 DIAGNOSIS — G8929 Other chronic pain: Secondary | ICD-10-CM | POA: Insufficient documentation

## 2018-01-19 DIAGNOSIS — Z760 Encounter for issue of repeat prescription: Secondary | ICD-10-CM | POA: Insufficient documentation

## 2018-01-19 MED ORDER — GABAPENTIN 300 MG PO CAPS: 300.0000 mg | ORAL_CAPSULE | Freq: Three times a day (TID) | ORAL | 3 refills | Status: DC

## 2018-01-19 MED ORDER — METHOCARBAMOL 500 MG PO TABS: 500.0000 mg | ORAL_TABLET | Freq: Three times a day (TID) | ORAL | 0 refills | Status: DC

## 2018-01-19 MED ORDER — ALBUTEROL SULFATE HFA 108 (90 BASE) MCG/ACT IN AERS: 2.0000 | INHALATION_SPRAY | Freq: Four times a day (QID) | RESPIRATORY_TRACT | 2 refills | Status: DC | PRN

## 2018-01-19 MED ORDER — METOPROLOL TARTRATE 50 MG PO TABS: 50.0000 mg | ORAL_TABLET | Freq: Two times a day (BID) | ORAL | 3 refills | Status: DC

## 2018-01-19 MED ORDER — HYDROCHLOROTHIAZIDE 25 MG PO TABS: 25.0000 mg | ORAL_TABLET | Freq: Every day | ORAL | 1 refills | Status: DC

## 2018-01-19 MED FILL — GABAPENTIN 300 MG CAPSULE: 300 | 30 days supply | Qty: 90 | Fill #0

## 2018-01-19 MED FILL — METHOCARBAMOL 500 MG TABS: 500 | 30 days supply | Qty: 90 | Fill #0

## 2018-01-19 NOTE — Progress Notes (Signed)
Assessment & Plan:  Lori was seen today for establish care, pain, vaginal itching and medication refill.  Diagnoses and all orders for this visit:  Hypothyroidism, unspecified type -     TSH  Morbid obesity with BMI of 50.0-59.9, adult (HCC) Discussed diet and exercise for person with BMI >25. Instructed: You must burn more calories than you eat. Losing 5 percent of your body weight should be considered a success. In the longer term, losing more than 15 percent of your body weight and staying at this weight is an extremely good result. However, keep in mind that even losing 5 percent of your body weight leads to important health benefits, so try not to get discouraged if you're not able to lose more than this. Will recheck weight in 3-6 months.  Essential hypertension -     hydrochlorothiazide (HYDRODIURIL) 25 MG tablet; Take 1 tablet (25 mg total) by mouth daily. Must have office visit for refills -     metoprolol tartrate (LOPRESSOR) 50 MG tablet; Take 1 tablet (50 mg total) by mouth 2 (two) times daily.  Continue all antihypertensives as prescribed.  Remember to bring in your blood pressure log with you for your follow up appointment.  DASH/Mediterranean Diets are healthier choices for HTN.    Uncomplicated asthma, unspecified asthma severity, unspecified whether persistent -     albuterol (PROVENTIL HFA;VENTOLIN HFA) 108 (90 Base) MCG/ACT inhaler; Inhale 2 puffs into the lungs every 6 (six) hours as needed for wheezing or shortness of breath.  Chronic midline low back pain with bilateral sciatica -     methocarbamol (ROBAXIN) 500 MG tablet; Take 1 tablet (500 mg total) by mouth 3 (three) times daily. -     gabapentin (NEURONTIN) 300 MG capsule; Take 1 capsule (300 mg total) by mouth 3 (three) times daily. Work on losing weight to help reduce back pain. May alternate with heat and ice application for pain relief. May also alternate with acetaminophen and Ibuprofen as prescribed for  back pain. Other alternatives include massage, acupuncture and water aerobics.  You must stay active and avoid a sedentary lifestyle.     Patient has been counseled on age-appropriate routine health concerns for screening and prevention. These are reviewed and up-to-date. Referrals have been placed accordingly. Immunizations are up-to-date or declined.    Subjective:   Chief Complaint  Patient presents with  . Establish Care    Patient is here to establish care for thyroidd  . Pain    Pt. have a sciatic nerve pain on her lower back and down to her leg.   . Vaginal Itching    Pt. stated she is having itching and soreness for two days.   . Medication Refill   HPI Lori Mcknight 32 y.o. female presents to office today to establish care.  Hypothyroidism She has missed several months of her synthroid. States her mother has been ill and she has been going back and forth to the hospital with her that she has not been following up for her own health needs. She denies any hypo or hyperthyroid symptoms. Her last dose of synthroid was decreased form 75mcg to 50mcg by her GYN over a year ago (10-2016). TSH has not been evaluated since that time.  Lab Results  Component Value Date   TSH 0.15 (L) 10/20/2016   Chronic Back Pain Chronic. Lumbar xray 10-29-2017 was negative for any bony abnormality of spine. She has tried percocet, hydrocodone, tramadol, Robaxin, Voltaren and valium in  the past. Currently taking robaxin and tramadol. I will switch the tramadol to gabapentin. May need to add amitriptyline at follow up appointment.  It was suggested to the patient that she work with physical therapy and lose weight. She does not have insurance so she was unable to be evaluated by PT. She will need to speak with the financial counselor prior to any additional imaging or referrals placed. Some of her pain is likely related to her weight. One issue that prohibits her weight loss is that she is taking megace for  menorrhagia with AUB. The other issue she reports is not being able to exercise regularly due to her back pain. Aggravating factors: Prolonged standing or sitting. She denies any recent trauma or injury but states she did fall down a flight of stairs as a young child.   Asthma Taking both QVAR and ventolin as needed. She was instructed on the correct usage of QVAR daily and ventolin prn. She denies any cough, shob, wheezing or excessive use of ventolin inhaler.    Review of Systems  Constitutional: Negative for fever, malaise/fatigue and weight loss.  HENT: Negative.  Negative for nosebleeds.   Eyes: Negative.  Negative for blurred vision, double vision and photophobia.  Respiratory: Negative.  Negative for cough, shortness of breath and wheezing.   Cardiovascular: Negative.  Negative for chest pain, palpitations and leg swelling.  Gastrointestinal: Negative.  Negative for heartburn, nausea and vomiting.  Musculoskeletal: Positive for back pain. Negative for falls and myalgias.       SEE HPI  Neurological: Negative.  Negative for dizziness, focal weakness, seizures and headaches.  Psychiatric/Behavioral: Negative.  Negative for suicidal ideas.    Past Medical History:  Diagnosis Date  . Anemia    history of anemia  . Asthma    inhaler as needed  . Chest pain of uncertain etiology    intermittent left side chest pain  . Chlamydia   . Genital herpes   . Hypertension    no meds since April. needs new px per pt   . Morbid obesity (HCC)   . Obese   . Obesity, Class III, BMI 40-49.9 (morbid obesity) (HCC) 11/06/2011  . PCOS (polycystic ovarian syndrome)   . Shortness of breath    on exertion from weight  . Thyroid disease   . Urinary tract infection     Past Surgical History:  Procedure Laterality Date  . DILATION AND CURETTAGE OF UTERUS    . HYSTEROSCOPY W/D&C N/A 04/06/2014   Procedure: DILATATION AND CURETTAGE /HYSTEROSCOPY ;  Surgeon: Tereso Newcomer, MD;  Location: [facility];   Service: Gynecology;  Laterality: N/A;  . LEFT HEART CATH AND CORONARY ANGIOGRAPHY N/A 12/12/2016   Procedure: Left Heart Cath and Coronary Angiography;  Surgeon: Peter M Swaziland, MD;  Location: [facility] INVASIVE CV LAB;  Service: Cardiovascular;  Laterality: N/A;  . NO PAST SURGERIES    . WISDOM TOOTH EXTRACTION      Family History  Problem Relation Age of Onset  . Heart disease Mother   . Sleep apnea Mother   . Diabetes Father   . Heart disease Father   . Heart disease Maternal Grandmother   . Diabetes Maternal Grandfather   . Heart disease Maternal Grandfather     Social History Reviewed with no changes to be made today.   Outpatient Medications Prior to Visit  Medication Sig Dispense Refill  . beclomethasone (QVAR) 40 MCG/ACT inhaler Inhale 2 puffs into the lungs 2 (two) times  daily.     . levothyroxine (SYNTHROID, LEVOTHROID) 50 MCG tablet Take 1 tablet (50 mcg total) by mouth daily. 30 tablet 0  . megestrol (MEGACE) 40 MG tablet Take one in the morning and one at night. 45 tablet 1  . metoprolol tartrate (LOPRESSOR) 50 MG tablet Take 1 tablet (50 mg total) by mouth 2 (two) times daily. 180 tablet 3  . traMADol (ULTRAM) 50 MG tablet Take 1 tablet (50 mg total) by mouth every 6 (six) hours as needed. 20 tablet 0  . hydrochlorothiazide (HYDRODIURIL) 25 MG tablet Take 1 tablet (25 mg total) by mouth daily. Must have office visit for refills (Patient not taking: Reported on 01/14/2018) 90 tablet 3   No facility-administered medications prior to visit.     No Known Allergies     Objective:    BP 115/78 (BP Location: Left Arm, Patient Position: Sitting, Cuff Size: Large)   Pulse 82   Temp 98.7 F (37.1 C) (Oral)   Ht 5\' 6"  (1.676 m)   Wt (!) 363 lb 9.6 oz (164.9 kg)   LMP 12/28/2017   SpO2 98%   BMI 58.69 kg/m  Wt Readings from Last 3 Encounters:  01/19/18 (!) 363 lb 9.6 oz (164.9 kg)  01/14/18 (!) 364 lb (165.1 kg)  10/29/17 (!) 366 lb (166 kg)    Physical Exam    Constitutional: She is oriented to person, place, and time. She appears well-developed and well-nourished. She is cooperative.  HENT:  Head: Normocephalic and atraumatic.  Eyes: EOM are normal.  Neck: Normal range of motion.  Cardiovascular: Normal rate, regular rhythm, normal heart sounds and intact distal pulses. Exam reveals no gallop and no friction rub.  No murmur heard. Pulmonary/Chest: Effort normal and breath sounds normal. No tachypnea. No respiratory distress. She has no decreased breath sounds. She has no wheezes. She has no rhonchi. She has no rales. She exhibits no tenderness.  Abdominal: Soft. Bowel sounds are normal.  Musculoskeletal: Normal range of motion. She exhibits no edema.       Lumbar back: She exhibits pain. She exhibits no swelling and no edema.  Neurological: She is alert and oriented to person, place, and time. Coordination normal.  Skin: Skin is warm and dry.  Psychiatric: She has a normal mood and affect. Her behavior is normal. Judgment and thought content normal.  Nursing note and vitals reviewed.        Patient has been counseled extensively about nutrition and exercise as well as the importance of adherence with medications and regular follow-up. The patient was given clear instructions to go to ER or return to medical center if symptoms don't improve, worsen or new problems develop. The patient verbalized understanding.   Follow-up: Return in about 3 weeks (around 02/09/2018) for sciatica.   Claiborne Rigg, FNP-BC [facility] [place], [place] [phone]   01/19/2018, 3:53 PM

## 2018-01-19 NOTE — Patient Instructions (Signed)
Radicular Pain Radicular pain is a type of pain that spreads from your back or neck along a spinal nerve. Spinal nerves are nerves that leave the spinal cord and go to the muscles. Radicular pain occurs when one of these nerves becomes irritated or squeezed (compressed). Radicular pain is sometimes called radiculopathy, radiculitis, or a pinched nerve. When you have this type of pain, you may also have weakness, numbness, or tingling in the area of your body that is supplied by the nerve. The pain may feel sharp and burning. Spinal nerves leave the spinal cord through openings between the 24 bones (vertebrae) that make up the spine. Radicular pain is often caused by something pushing on a spinal nerve. This pushing may be done by a vertebra or by one of the round cushions between vertebrae (intervertebral disks). This can result from an injury, from wear and tear or aging of a disk, or from the growth of a bone spur that pushes on the nerve. Radicular pain can occur in various areas depending on which spinal nerve is affected:  Cervical radicular pain occurs in the neck. You may also feel pain, numbness, weakness, or tingling in the arms.  Thoracic radicular pain occurs in the mid-spine area. You would feel this pain in the back and chest. This type is rare.  Lumbar radicular pain occurs in the lower back area. You would feel this pain as low back pain. You may feel pain, numbness, weakness, or tingling in the buttocks or legs. Sciatica is a type of lumbar radicular pain that shoots down the back of the leg.  Radicular pain often goes away when you follow instructions from your health care provider for relieving pain at home. Follow these instructions at home: Managing pain  If directed, apply ice to the affected area: ? Put ice in a plastic bag. ? Place a towel between your skin and the bag. ? Leave the ice on for 20 minutes, 2-3 times a day.  If directed, apply heat to the affected area as often  as told by your health care provider. Use the heat source that your health care provider recommends, such as a moist heat pack or a heating pad. ? Place a towel between your skin and the heat source. ? Leave the heat on for 20-30 minutes. ? Remove the heat if your skin turns bright red. This is especially important if you are unable to feel pain, heat, or cold. You may have a greater risk of getting burned. Activity   Do not sit or rest in bed for long periods of time.  Try to stay as active as possible. Ask your health care provider what type of exercise or activity is best for you.  Avoid activities that make your pain worse, such as bending and lifting.  Do not lift anything that is heavier than 10 lb (4.5 kg). Practice using proper technique when lifting items. Proper lifting technique involves bending your knees and rising up.  Do strength and range-of-motion exercises only as told by your health care provider. General instructions  Take over-the-counter and prescription medicines only as told by your health care provider.  Pay attention to any changes in your symptoms.  Keep all follow-up visits as told by your health care provider. This is important. Contact a health care provider if:  Your pain and other symptoms get worse.  Your pain medicine is not helping.  Your pain has not improved after a few weeks of home care.    You have a fever. Get help right away if:  You have severe pain, weakness, or numbness.  You have difficulty with bladder or bowel control. This information is not intended to replace advice given to you by your health care provider. Make sure you discuss any questions you have with your health care provider. Document Released: 11/06/2004 Document Revised: 03/06/2016 Document Reviewed: 04/25/2015 Elsevier Interactive Patient Education  2018 Elsevier Inc.  Sciatica Sciatica is pain, numbness, weakness, or tingling along the path of the sciatic nerve.  The sciatic nerve starts in the lower back and runs down the back of each leg. The nerve controls the muscles in the lower leg and in the back of the knee. It also provides feeling (sensation) to the back of the thigh, the lower leg, and the sole of the foot. Sciatica is a symptom of another medical condition that pinches or puts pressure on the sciatic nerve. Generally, sciatica only affects one side of the body. Sciatica usually goes away on its own or with treatment. In some cases, sciatica may keep coming back (recur). What are the causes? This condition is caused by pressure on the sciatic nerve, or pinching of the sciatic nerve. This may be the result of:  A disk in between the bones of the spine (vertebrae) bulging out too far (herniated disk).  Age-related changes in the spinal disks (degenerative disk disease).  A pain disorder that affects a muscle in the buttock (piriformis syndrome).  Extra bone growth (bone spur) near the sciatic nerve.  An injury or break (fracture) of the pelvis.  Pregnancy.  Tumor (rare).  What increases the risk? The following factors may make you more likely to develop this condition:  Playing sports that place pressure or stress on the spine, such as football or weight lifting.  Having poor strength and flexibility.  A history of back injury.  A history of back surgery.  Sitting for long periods of time.  Doing activities that involve repetitive bending or lifting.  Obesity.  What are the signs or symptoms? Symptoms can vary from mild to very severe, and they may include:  Any of these problems in the lower back, leg, hip, or buttock: ? Mild tingling or dull aches. ? Burning sensations. ? Sharp pains.  Numbness in the back of the calf or the sole of the foot.  Leg weakness.  Severe back pain that makes movement difficult.  These symptoms may get worse when you cough, sneeze, or laugh, or when you sit or stand for long periods of  time. Being overweight may also make symptoms worse. In some cases, symptoms may recur over time. How is this diagnosed? This condition may be diagnosed based on:  Your symptoms.  A physical exam. Your health care provider may ask you to do certain movements to check whether those movements trigger your symptoms.  You may have tests, including: ? Blood tests. ? X-rays. ? MRI. ? CT scan.  How is this treated? In many cases, this condition improves on its own, without any treatment. However, treatment may include:  Reducing or modifying physical activity during periods of pain.  Exercising and stretching to strengthen your abdomen and improve the flexibility of your spine.  Icing and applying heat to the affected area.  Medicines that help: ? To relieve pain and swelling. ? To relax your muscles.  Injections of medicines that help to relieve pain, irritation, and inflammation around the sciatic nerve (steroids).  Surgery.  Follow these instructions  at home: Medicines  Take over-the-counter and prescription medicines only as told by your health care provider.  Do not drive or operate heavy machinery while taking prescription pain medicine. Managing pain  If directed, apply ice to the affected area. ? Put ice in a plastic bag. ? Place a towel between your skin and the bag. ? Leave the ice on for 20 minutes, 2-3 times a day.  After icing, apply heat to the affected area before you exercise or as often as told by your health care provider. Use the heat source that your health care provider recommends, such as a moist heat pack or a heating pad. ? Place a towel between your skin and the heat source. ? Leave the heat on for 20-30 minutes. ? Remove the heat if your skin turns bright red. This is especially important if you are unable to feel pain, heat, or cold. You may have a greater risk of getting burned. Activity  Return to your normal activities as told by your health  care provider. Ask your health care provider what activities are safe for you. ? Avoid activities that make your symptoms worse.  Take brief periods of rest throughout the day. Resting in a lying or standing position is usually better than sitting to rest. ? When you rest for longer periods, mix in some mild activity or stretching between periods of rest. This will help to prevent stiffness and pain. ? Avoid sitting for long periods of time without moving. Get up and move around at least one time each hour.  Exercise and stretch regularly, as told by your health care provider.  Do not lift anything that is heavier than 10 lb (4.5 kg) while you have symptoms of sciatica. When you do not have symptoms, you should still avoid heavy lifting, especially repetitive heavy lifting.  When you lift objects, always use proper lifting technique, which includes: ? Bending your knees. ? Keeping the load close to your body. ? Avoiding twisting. General instructions  Use good posture. ? Avoid leaning forward while sitting. ? Avoid hunching over while standing.  Maintain a healthy weight. Excess weight puts extra stress on your back and makes it difficult to maintain good posture.  Wear supportive, comfortable shoes. Avoid wearing high heels.  Avoid sleeping on a mattress that is too soft or too hard. A mattress that is firm enough to support your back when you sleep may help to reduce your pain.  Keep all follow-up visits as told by your health care provider. This is important. Contact a health care provider if:  You have pain that wakes you up when you are sleeping.  You have pain that gets worse when you lie down.  Your pain is worse than you have experienced in the past.  Your pain lasts longer than 4 weeks.  You experience unexplained weight loss. Get help right away if:  You lose control of your bowel or bladder (incontinence).  You have: ? Weakness in your lower back, pelvis,  buttocks, or legs that gets worse. ? Redness or swelling of your back. ? A burning sensation when you urinate. This information is not intended to replace advice given to you by your health care provider. Make sure you discuss any questions you have with your health care provider. Document Released: 09/23/2001 Document Revised: 03/04/2016 Document Reviewed: 06/08/2015 Elsevier Interactive Patient Education  [employer]2018 Elsevier Inc.

## 2018-01-20 ENCOUNTER — Other Ambulatory Visit: Payer: Self-pay | Admitting: Nurse Practitioner

## 2018-01-20 DIAGNOSIS — N760 Acute vaginitis: Secondary | ICD-10-CM

## 2018-01-20 LAB — TSH: TSH: 4.39 u[IU]/mL (ref 0.450–4.500)

## 2018-01-21 LAB — CERVICOVAGINAL ANCILLARY ONLY
Bacterial vaginitis: POSITIVE — AB
Candida vaginitis: NEGATIVE
Chlamydia: NEGATIVE
NEISSERIA GONORRHEA: NEGATIVE
Trichomonas: POSITIVE — AB

## 2018-01-22 ENCOUNTER — Other Ambulatory Visit: Payer: Self-pay | Admitting: Nurse Practitioner

## 2018-01-22 MED ORDER — LEVOTHYROXINE SODIUM 50 MCG PO TABS: 50.0000 ug | ORAL_TABLET | Freq: Every day | ORAL | 0 refills | Status: DC

## 2018-01-22 MED ORDER — METRONIDAZOLE 500 MG PO TABS: 500.0000 mg | ORAL_TABLET | Freq: Two times a day (BID) | ORAL | 0 refills | Status: AC

## 2018-01-22 MED FILL — LEVOTHYROXINE 50 MCG TABLET: 50 | 30 days supply | Qty: 30 | Fill #0

## 2018-01-22 MED FILL — metroNIDAZOLE 500 MG TABS: 500 | 7 days supply | Qty: 14 | Fill #0

## 2018-02-01 ENCOUNTER — Telehealth: Payer: Self-pay | Admitting: General Practice

## 2018-02-01 NOTE — Telephone Encounter (Signed)
Patient called and left message on nurse voicemail line stating she started taking 40mg  of megace on 4/6 and it stopped her bleeding. Patient states last night she started back bleeding again though and is having really bad cramps. Called patient, no answer- left message on voicemail stating we are trying to reach you to return your phone call, please call us back if you still need assistance.

## 2018-02-05 ENCOUNTER — Encounter: Payer: Self-pay | Admitting: Nurse Practitioner

## 2018-02-18 ENCOUNTER — Encounter: Payer: Self-pay | Admitting: Obstetrics & Gynecology

## 2018-02-18 ENCOUNTER — Ambulatory Visit (INDEPENDENT_AMBULATORY_CARE_PROVIDER_SITE_OTHER): Payer: Self-pay | Admitting: Obstetrics & Gynecology

## 2018-02-18 ENCOUNTER — Other Ambulatory Visit (HOSPITAL_COMMUNITY)
Admission: RE | Admit: 2018-02-18 | Discharge: 2018-02-18 | Disposition: A | Discharge status: Home / Self Care | Payer: Self-pay | Source: Ambulatory Visit | Attending: Obstetrics & Gynecology | Admitting: Obstetrics & Gynecology

## 2018-02-18 VITALS — BP 141/103 | HR 98 | Wt 363.0 lb

## 2018-02-18 DIAGNOSIS — R339 Retention of urine, unspecified: Secondary | ICD-10-CM

## 2018-02-18 DIAGNOSIS — N938 Other specified abnormal uterine and vaginal bleeding: Secondary | ICD-10-CM | POA: Insufficient documentation

## 2018-02-18 DIAGNOSIS — D649 Anemia, unspecified: Secondary | ICD-10-CM

## 2018-02-18 DIAGNOSIS — R9389 Abnormal findings on diagnostic imaging of other specified body structures: Secondary | ICD-10-CM

## 2018-02-18 LAB — POCT URINALYSIS DIP (DEVICE)
Bilirubin Urine: NEGATIVE
GLUCOSE, UA: NEGATIVE mg/dL
Hgb urine dipstick: NEGATIVE
Ketones, ur: NEGATIVE mg/dL
Leukocytes, UA: NEGATIVE
Nitrite: NEGATIVE
PH: 7 (ref 5.0–8.0)
Protein, ur: NEGATIVE mg/dL
Specific Gravity, Urine: 1.015 (ref 1.005–1.030)
UROBILINOGEN UA: 0.2 mg/dL (ref 0.0–1.0)

## 2018-02-18 LAB — POCT PREGNANCY, URINE: PREG TEST UR: NEGATIVE

## 2018-02-18 NOTE — Addendum Note (Signed)
Addended by: Garret Reddish on: 02/18/2018 12:01 PM   Modules accepted: Orders

## 2018-02-18 NOTE — Progress Notes (Signed)
   Subjective:    Patient ID: Lori Mcknight, female    DOB: 01/24/1986, 32 y.o.   MRN: [id number]  HPI 32 yo G0 here for [facility] due to DUB and a 32 mm endometrial lining seen on recent gyn ultrasound. She has had one in the past for the same reason. She is currently taking megace to control the bleeding. She is not bleeding today.  Her other complaint is that of feeling like she cannot completely empty her bladder.  Review of Systems Pap smear normal 9/17    Objective:   Physical Exam Pleasant Breathing, conversing, and ambulating normally Well nourished, well hydrated Black female, no apparent distress Abd- benign, morbidly obese  UPT negative, consent signed, time out done Cervix prepped with betadine and grasped with a single tooth tenaculum Uterus sounded to 12 cm Pipelle used for 2 passes with a large amount of tissue obtained. She tolerated the procedure well.  Urine dip negative     Assessment & Plan:  DUB, mild anemia- continue megace Come back 2 weeks for results  Feeling that she is not emptying her bladder completely- refer to urology

## 2018-02-26 ENCOUNTER — Other Ambulatory Visit: Payer: Self-pay | Admitting: Nurse Practitioner

## 2018-02-26 DIAGNOSIS — G8929 Other chronic pain: Secondary | ICD-10-CM

## 2018-02-26 DIAGNOSIS — M5442 Lumbago with sciatica, left side: Principal | ICD-10-CM

## 2018-02-26 DIAGNOSIS — M5441 Lumbago with sciatica, right side: Principal | ICD-10-CM

## 2018-02-26 MED ORDER — LEVOTHYROXINE SODIUM 50 MCG PO TABS: 50.0000 ug | ORAL_TABLET | Freq: Every day | ORAL | 0 refills | Status: DC

## 2018-02-26 MED ORDER — METHOCARBAMOL 500 MG PO TABS: 500.0000 mg | ORAL_TABLET | Freq: Three times a day (TID) | ORAL | 0 refills | Status: DC

## 2018-03-01 ENCOUNTER — Encounter: Payer: Self-pay | Admitting: Obstetrics & Gynecology

## 2018-03-01 ENCOUNTER — Encounter: Payer: Self-pay | Admitting: *Deleted

## 2018-03-04 ENCOUNTER — Ambulatory Visit (HOSPITAL_COMMUNITY)
Admission: EM | Admit: 2018-03-04 | Discharge: 2018-03-04 | Disposition: A | Discharge status: Home / Self Care | Payer: Self-pay | Attending: Family Medicine | Admitting: Family Medicine

## 2018-03-04 ENCOUNTER — Encounter (HOSPITAL_COMMUNITY): Payer: Self-pay | Admitting: Emergency Medicine

## 2018-03-04 DIAGNOSIS — M5441 Lumbago with sciatica, right side: Secondary | ICD-10-CM

## 2018-03-04 DIAGNOSIS — I10 Essential (primary) hypertension: Secondary | ICD-10-CM

## 2018-03-04 DIAGNOSIS — G44209 Tension-type headache, unspecified, not intractable: Secondary | ICD-10-CM

## 2018-03-04 DIAGNOSIS — M5442 Lumbago with sciatica, left side: Secondary | ICD-10-CM

## 2018-03-04 DIAGNOSIS — G8929 Other chronic pain: Secondary | ICD-10-CM

## 2018-03-04 MED ORDER — METHOCARBAMOL 500 MG PO TABS: 500.0000 mg | ORAL_TABLET | Freq: Three times a day (TID) | ORAL | 0 refills | Status: DC | PRN

## 2018-03-04 MED ORDER — KETOROLAC TROMETHAMINE 60 MG/2ML IM SOLN
60.0000 mg | Freq: Once | INTRAMUSCULAR | Status: AC
  Administered 2018-03-04: 60 mg via INTRAMUSCULAR

## 2018-03-04 MED ORDER — ONDANSETRON 4 MG PO TBDP
ORAL_TABLET | ORAL | Status: AC
  Filled 2018-03-04: qty 1

## 2018-03-04 MED ORDER — DEXAMETHASONE SODIUM PHOSPHATE 10 MG/ML IJ SOLN
INTRAMUSCULAR | Status: AC
  Filled 2018-03-04: qty 1

## 2018-03-04 MED ORDER — DEXAMETHASONE SODIUM PHOSPHATE 10 MG/ML IJ SOLN
10.0000 mg | Freq: Once | INTRAMUSCULAR | Status: AC
  Administered 2018-03-04: 10 mg via INTRAMUSCULAR

## 2018-03-04 MED ORDER — KETOROLAC TROMETHAMINE 60 MG/2ML IM SOLN
INTRAMUSCULAR | Status: AC
  Filled 2018-03-04: qty 2

## 2018-03-04 MED ORDER — IBUPROFEN 800 MG PO TABS
800.0000 mg | ORAL_TABLET | Freq: Three times a day (TID) | ORAL | 0 refills | Status: DC
Start: 2018-03-04 — End: 2018-04-09

## 2018-03-04 MED ORDER — ONDANSETRON 4 MG PO TBDP
4.0000 mg | ORAL_TABLET | Freq: Once | ORAL | Status: AC
  Administered 2018-03-04: 4 mg via ORAL

## 2018-03-04 NOTE — ED Provider Notes (Signed)
[facility]    CSN: [id number] Arrival date & time: 03/04/18  1007     History   Chief Complaint Chief Complaint  Patient presents with  . Headache    HPI Lori Mcknight is a 32 y.o. female.   Lori presents with complaints of headache which started yesterday afternoon and had gradually worsened over night. She feels pain primarily to occipital head but also mild to frontal head. Some dizziness. She feels her posterior head is tender to touch like there is a "knot."  No injury. Per chart review has been seen for frontal migraine with dizziness in the past, not regularly, last in 08/2016. Patient states she does not get migraines regularly. Took tylenol last night which did not help. No medication today. Pain is sharp and throbbing, 10/10. Without light or sound sensitivity. Pain with any touching or movement of the head. Minimal nausea. No head injury.  Without thunderclap onset. No vision changes. Has been eating and drinking well. Did not take her BP medications today. Hx: htn, obesity, pcos, migraine, thyroid disease.   ROS per HPI.      Past Medical History:  Diagnosis Date  . Anemia    history of anemia  . Asthma    inhaler as needed  . Chest pain of uncertain etiology    intermittent left side chest pain  . Chlamydia   . Genital herpes   . Hypertension    no meds since April. needs new px per pt   . Morbid obesity (HCC)   . Obese   . Obesity, Class III, BMI 40-49.9 (morbid obesity) (HCC) 11/06/2011  . PCOS (polycystic ovarian syndrome)   . Shortness of breath    on exertion from weight  . Thyroid disease   . Urinary tract infection     Patient Active Problem List   Diagnosis Date Noted  . Incomplete bladder emptying 02/18/2018  . DUB (dysfunctional uterine bleeding) 02/18/2018  . Anemia 02/18/2018  . Hypothyroid 01/29/2017  . Migraines 08/26/2016  . Chest pain 02/04/2016  . Abnormal uterine bleeding (AUB) 01/09/2014  . Thickened endometrium  12/02/2013  . Essential hypertension 10/21/2013  . PCOS (polycystic ovarian syndrome) 11/06/2011  . Infertility, female 11/06/2011  . Pelvic pain in female 08/01/2011    Past Surgical History:  Procedure Laterality Date  . DILATION AND CURETTAGE OF UTERUS    . HYSTEROSCOPY W/D&C N/A 04/06/2014   Procedure: DILATATION AND CURETTAGE /HYSTEROSCOPY ;  Surgeon: Tereso Newcomer, MD;  Location: [facility];  Service: Gynecology;  Laterality: N/A;  . LEFT HEART CATH AND CORONARY ANGIOGRAPHY N/A 12/12/2016   Procedure: Left Heart Cath and Coronary Angiography;  Surgeon: Peter M Swaziland, MD;  Location: [facility] INVASIVE CV LAB;  Service: Cardiovascular;  Laterality: N/A;  . NO PAST SURGERIES    . WISDOM TOOTH EXTRACTION      OB History    Gravida  0   Para      Term      Preterm      AB      Living        SAB      TAB      Ectopic      Multiple      Live Births               Home Medications    Prior to Admission medications   Medication Sig Start Date End Date Taking? Authorizing Provider  albuterol (PROVENTIL HFA;VENTOLIN HFA) 108 (  90 Base) MCG/ACT inhaler Inhale 2 puffs into the lungs every 6 (six) hours as needed for wheezing or shortness of breath. 01/19/18   Claiborne Rigg, NP  beclomethasone (QVAR) 40 MCG/ACT inhaler Inhale 2 puffs into the lungs 2 (two) times daily.     [provider]  gabapentin (NEURONTIN) 300 MG capsule Take 1 capsule (300 mg total) by mouth 3 (three) times daily. 01/19/18   Claiborne Rigg, NP  hydrochlorothiazide (HYDRODIURIL) 25 MG tablet Take 1 tablet (25 mg total) by mouth daily. Must have office visit for refills 01/19/18   Claiborne Rigg, NP  ibuprofen (ADVIL,MOTRIN) 800 MG tablet Take 1 tablet (800 mg total) by mouth 3 (three) times daily. 03/04/18   Georgetta Haber, NP  levothyroxine (SYNTHROID, LEVOTHROID) 50 MCG tablet Take 1 tablet (50 mcg total) by mouth daily. 02/26/18   Claiborne Rigg, NP  megestrol (MEGACE) 40 MG tablet Take one  in the morning and one at night. 01/14/18   Burleson, Brand Males, NP  methocarbamol (ROBAXIN) 500 MG tablet Take 1 tablet (500 mg total) by mouth every 8 (eight) hours as needed for muscle spasms. 03/04/18   Georgetta Haber, NP  metoprolol tartrate (LOPRESSOR) 50 MG tablet Take 1 tablet (50 mg total) by mouth 2 (two) times daily. 01/19/18   Claiborne Rigg, NP    Family History Family History  Problem Relation Age of Onset  . Heart disease Mother   . Sleep apnea Mother   . Diabetes Father   . Heart disease Father   . Heart disease Maternal Grandmother   . Diabetes Maternal Grandfather   . Heart disease Maternal Grandfather     Social History Social History   Tobacco Use  . Smoking status: Current Some Day Smoker    Last attempt to quit: 01/04/2011    Years since quitting: 7.1  . Smokeless tobacco: Never Used  . Tobacco comment: 2-3 cigerettes a week.  Substance Use Topics  . Alcohol use: Yes    Alcohol/week: 0.0 oz    Comment: occasonal   . Drug use: Yes    Types: Marijuana     Allergies   Patient has no known allergies.   Review of Systems Review of Systems   Physical Exam Triage Vital Signs ED Triage Vitals [03/04/18 1040]  Enc Vitals Group     BP (!) 171/87     Pulse Rate 83     Resp 18     Temp 98.6 F (37 C)     Temp Source Oral     SpO2 97 %     Weight      Height      Head Circumference      Peak Flow      Pain Score      Pain Loc      Pain Edu?      Excl. in GC?    No data found.  Updated Vital Signs BP 137/89   Pulse 83   Temp 98.6 F (37 C) (Oral)   Resp 18   LMP  (LMP Unknown) Comment: bleeding since Feb  SpO2 97%    Physical Exam  Constitutional: She is oriented to person, place, and time. She appears well-developed and well-nourished. No distress.  HENT:  Head:    Right Ear: External ear normal.  Left Ear: External ear normal.  Tenderness on even light palpation to right posterior scalp; without neck pain, tenderness or rigidity  Eyes: Pupils are equal, round, and reactive to light. EOM are normal.  Mild light sensitivity with exam noted   Cardiovascular: Normal rate, regular rhythm and normal heart sounds.  Pulmonary/Chest: Effort normal and breath sounds normal.  Neurological: She is alert and oriented to person, place, and time. She is not disoriented. No cranial nerve deficit or sensory deficit. GCS eye subscore is 4. GCS verbal subscore is 5. GCS motor subscore is 6.  Dizziness and unsteady with Romberg  Skin: Skin is warm and dry.  Psychiatric: She has a normal mood and affect.     UC Treatments / Results  Labs (all labs ordered are listed, but only abnormal results are displayed) Labs Reviewed - No data to display  EKG None  Radiology No results found.  Procedures Procedures (including critical care time)  Medications Ordered in UC Medications  ketorolac (TORADOL) injection 60 mg (60 mg Intramuscular Given 03/04/18 1105)  dexamethasone (DECADRON) injection 10 mg (10 mg Intramuscular Given 03/04/18 1108)  ondansetron (ZOFRAN-ODT) disintegrating tablet 4 mg (4 mg Oral Given 03/04/18 1105)    Initial Impression / Assessment and Plan / UC Course  I have reviewed the triage vital signs and the nursing notes.  Pertinent labs & imaging results that were available during my care of the patient were reviewed by me and considered in my medical decision making (see chart for details).     Toradol, zofran and decadron provided, reassessed approximately 45 minutes later. Patient without any pain improvement. Pain primarily with ROM to head, feels a "pulling" to posterior scalp. Without any true redflag neurological findings at this time, or trauma. Remains consistent with tension headache. encouraged increased fluids, muscle relaxer once at home, sleep, nsaids for pain control. Discussed return precautions and follow up at ER precautions at length. Encouraged to take BP medications as noted elevation in BP.  Patient verbalized understanding and agreeable to plan.  Ambulatory out of clinic without difficulty.    Case discussed with supervising physician Dr. Delton See.  Final Clinical Impressions(s) / UC Diagnoses   Final diagnoses:  Acute non intractable tension-type headache     Discharge Instructions     This appears consistent with a tension headache.  We will treat this with antiinflammatories and muscle relaxer.  May repeat ibuprofen in 6 hours. Muscle relaxer as needed- may cause drowsiness so do not take if you will be driving or drinking alcohol. If you develop increased pain, dizziness, vision changes, weakness or otherwise worsening please go to Er.     ED Prescriptions    Medication Sig Dispense Auth. Provider   ibuprofen (ADVIL,MOTRIN) 800 MG tablet Take 1 tablet (800 mg total) by mouth 3 (three) times daily. 21 tablet Linus Mako B, NP   methocarbamol (ROBAXIN) 500 MG tablet Take 1 tablet (500 mg total) by mouth every 8 (eight) hours as needed for muscle spasms. 15 tablet Georgetta Haber, NP     Controlled Substance Prescriptions [place] Controlled Substance Registry consulted? Not Applicable   Georgetta Haber, NP 03/04/18 1229

## 2018-03-04 NOTE — ED Triage Notes (Signed)
Pt c/o headache since yesterday, pt states it hurts when she touches the back of her head.

## 2018-03-04 NOTE — Discharge Instructions (Signed)
This appears consistent with a tension headache.  We will treat this with antiinflammatories and muscle relaxer.  May repeat ibuprofen in 6 hours. Muscle relaxer as needed- may cause drowsiness so do not take if you will be driving or drinking alcohol. If you develop increased pain, dizziness, vision changes, weakness or otherwise worsening please go to Er.

## 2018-03-10 ENCOUNTER — Encounter: Payer: Self-pay | Admitting: Obstetrics & Gynecology

## 2018-03-26 ENCOUNTER — Encounter (HOSPITAL_COMMUNITY): Payer: Self-pay | Admitting: *Deleted

## 2018-03-26 ENCOUNTER — Inpatient Hospital Stay (HOSPITAL_COMMUNITY)
Admission: AD | Admit: 2018-03-26 | Discharge: 2018-03-26 | Disposition: A | Discharge status: Home / Self Care | Payer: Self-pay | Source: Ambulatory Visit | Attending: Obstetrics and Gynecology | Admitting: Obstetrics and Gynecology

## 2018-03-26 DIAGNOSIS — J45909 Unspecified asthma, uncomplicated: Secondary | ICD-10-CM | POA: Insufficient documentation

## 2018-03-26 DIAGNOSIS — I10 Essential (primary) hypertension: Secondary | ICD-10-CM | POA: Insufficient documentation

## 2018-03-26 DIAGNOSIS — Z833 Family history of diabetes mellitus: Secondary | ICD-10-CM | POA: Insufficient documentation

## 2018-03-26 DIAGNOSIS — E079 Disorder of thyroid, unspecified: Secondary | ICD-10-CM | POA: Insufficient documentation

## 2018-03-26 DIAGNOSIS — Z79899 Other long term (current) drug therapy: Secondary | ICD-10-CM | POA: Insufficient documentation

## 2018-03-26 DIAGNOSIS — Z79890 Hormone replacement therapy (postmenopausal): Secondary | ICD-10-CM | POA: Insufficient documentation

## 2018-03-26 DIAGNOSIS — N938 Other specified abnormal uterine and vaginal bleeding: Secondary | ICD-10-CM | POA: Insufficient documentation

## 2018-03-26 DIAGNOSIS — Z9889 Other specified postprocedural states: Secondary | ICD-10-CM | POA: Insufficient documentation

## 2018-03-26 DIAGNOSIS — Z8249 Family history of ischemic heart disease and other diseases of the circulatory system: Secondary | ICD-10-CM | POA: Insufficient documentation

## 2018-03-26 DIAGNOSIS — Z836 Family history of other diseases of the respiratory system: Secondary | ICD-10-CM | POA: Insufficient documentation

## 2018-03-26 DIAGNOSIS — F17210 Nicotine dependence, cigarettes, uncomplicated: Secondary | ICD-10-CM | POA: Insufficient documentation

## 2018-03-26 LAB — URINALYSIS, ROUTINE W REFLEX MICROSCOPIC
Bilirubin Urine: NEGATIVE
GLUCOSE, UA: NEGATIVE mg/dL
HGB URINE DIPSTICK: NEGATIVE
KETONES UR: NEGATIVE mg/dL
LEUKOCYTES UA: NEGATIVE
Nitrite: NEGATIVE
Protein, ur: NEGATIVE mg/dL
Specific Gravity, Urine: 1.012 (ref 1.005–1.030)
pH: 7 (ref 5.0–8.0)

## 2018-03-26 LAB — CBC
HCT: 37.9 % (ref 36.0–46.0)
Hemoglobin: 11.7 g/dL — ABNORMAL LOW (ref 12.0–15.0)
MCH: 25.9 pg — AB (ref 26.0–34.0)
MCHC: 30.9 g/dL (ref 30.0–36.0)
MCV: 84 fL (ref 78.0–100.0)
Platelets: 245 10*3/uL (ref 150–400)
RBC: 4.51 MIL/uL (ref 3.87–5.11)
RDW: 15.7 % — AB (ref 11.5–15.5)
WBC: 9.3 10*3/uL (ref 4.0–10.5)

## 2018-03-26 LAB — WET PREP, GENITAL
CLUE CELLS WET PREP: NONE SEEN
Sperm: NONE SEEN
Trich, Wet Prep: NONE SEEN
WBC, Wet Prep HPF POC: NONE SEEN
Yeast Wet Prep HPF POC: NONE SEEN

## 2018-03-26 LAB — POCT PREGNANCY, URINE: PREG TEST UR: NEGATIVE

## 2018-03-26 MED ORDER — KETOROLAC TROMETHAMINE 60 MG/2ML IM SOLN
INTRAMUSCULAR | Status: AC
  Administered 2018-03-26: 60 mg via INTRAMUSCULAR
  Filled 2018-03-26: qty 2

## 2018-03-26 MED ORDER — KETOROLAC TROMETHAMINE 60 MG/2ML IM SOLN
60.0000 mg | Freq: Once | INTRAMUSCULAR | Status: AC
  Administered 2018-03-26: 60 mg via INTRAMUSCULAR

## 2018-03-26 MED ORDER — MEGESTROL ACETATE 40 MG PO TABS: 80.0000 mg | ORAL_TABLET | Freq: Three times a day (TID) | ORAL | 1 refills | Status: DC

## 2018-03-26 NOTE — Discharge Instructions (Signed)
-  take megace 80 milligrams three times a day for four days, then take 80 mg of megace two times a day.    Menorrhagia Menorrhagia is when your menstrual periods are heavy or last longer than usual. Follow these instructions at home:  Only take medicine as told by your doctor.  Take any iron pills as told by your doctor. Heavy bleeding may cause low levels of iron in your body.  Do not take aspirin 1 week before or during your period. Aspirin can make the bleeding worse.  Lie down for a while if you change your tampon or pad more than once in 2 hours. This may help lessen the bleeding.  Eat a healthy diet and foods with iron. These foods include leafy green vegetables, meat, liver, eggs, and whole grain breads and cereals.  Do not try to lose weight. Wait until the heavy bleeding has stopped and your iron level is normal. Contact a doctor if:  You soak through a pad or tampon every 1 or 2 hours, and this happens every time you have a period.  You need to use pads and tampons at the same time because you are bleeding so much.  You need to change your pad or tampon during the night.  You have a period that lasts for more than 8 days.  You pass clots bigger than 1 inch (2.5 cm) wide.  You have irregular periods that happen more or less often than once a month.  You feel dizzy or pass out (faint).  You feel very weak or tired.  You feel short of breath or feel your heart is beating too fast when you exercise.  You feel sick to your stomach (nausea) and you throw up (vomit) while you are taking your medicine.  You have watery poop (diarrhea) while you are taking your medicine.  You have any problems that may be related to the medicine you are taking. Get help right away if:  You soak through 4 or more pads or tampons in 2 hours.  You have any bleeding while you are pregnant. This information is not intended to replace advice given to you by your health care provider. Make  sure you discuss any questions you have with your health care provider. Document Released: 07/08/2008 Document Revised: 03/06/2016 Document Reviewed: 03/31/2013 Elsevier Interactive Patient Education  2017 [employer]

## 2018-03-26 NOTE — MAU Note (Signed)
Bleeding for 3 days, increased yesterday an then passed golf ball amount of tissue with increased bleeding and cramping.  Pt has been on medication for bleeding but stopped 1-2 weeks ago.  S/p endometrial biopsy on 5/9 to test uterine lining.

## 2018-03-26 NOTE — MAU Provider Note (Addendum)
Patient Lori Mcknight is a 32 y.o. G0P0 At Unknown here with complaints of cramps and bleeding and pelvic pain. She has pain in her pelvis with urination and having a BM, but not burning when she urinates. She denies low back pain, dizziness, SOB, weakness.    History     CSN: [id number]  Arrival date and time: 03/26/18 1400   First Provider Initiated Contact with Patient 03/26/18 1552      Chief Complaint  Patient presents with  . Vaginal Bleeding   Vaginal Bleeding  The patient's primary symptoms include pelvic pain. This is a chronic problem. The current episode started more than 1 month ago. Associated symptoms include abdominal pain.   Patient has had a history of heavy cycles. She gets "bad pain" and then she passes clots and tissue. As soon as she passes the clot, she feels better.  Yesterday she had several heavy clots and then this moring at 5 am she passed large clots and tissue; she has brought one in to be examined. Pain was a 10/10, now the pain is a 3/10 and her bleeding has slowed significantly.   Patient was given megace in April for heaving bleeding, and had an US which showed a thick uterine lining. She stopped taking Megace about a week ago bc she said it wasn't helping that much. She has a history of this problem; she had a recent EMB which was negative.  OB History    Gravida  0   Para      Term      Preterm      AB      Living        SAB      TAB      Ectopic      Multiple      Live Births              Past Medical History:  Diagnosis Date  . Anemia    history of anemia  . Asthma    inhaler as needed  . Chest pain of uncertain etiology    intermittent left side chest pain  . Chlamydia   . Genital herpes   . Hypertension    no meds since April. needs new px per pt   . Morbid obesity (HCC)   . Obese   . Obesity, Class III, BMI 40-49.9 (morbid obesity) (HCC) 11/06/2011  . PCOS (polycystic ovarian syndrome)   . Shortness of breath     on exertion from weight  . Thyroid disease   . Urinary tract infection     Past Surgical History:  Procedure Laterality Date  . DILATION AND CURETTAGE OF UTERUS    . HYSTEROSCOPY W/D&C N/A 04/06/2014   Procedure: DILATATION AND CURETTAGE /HYSTEROSCOPY ;  Surgeon: Tereso NewcomerUgonna A Anyanwu, MD;  Location: [facility];  Service: Gynecology;  Laterality: N/A;  . LEFT HEART CATH AND CORONARY ANGIOGRAPHY N/A 12/12/2016   Procedure: Left Heart Cath and Coronary Angiography;  Surgeon: Peter M SwazilandJordan, MD;  [facility];  Service: Cardiovascular;  Laterality: N/A;  . NO PAST SURGERIES    . WISDOM TOOTH EXTRACTION      Family History  Problem Relation Age of Onset  . Heart disease Mother   . Sleep apnea Mother   . Diabetes Father   . Heart disease Father   . Heart disease Maternal Grandmother   . Diabetes Maternal Grandfather   . Heart disease Maternal Grandfather  Social History   Tobacco Use  . Smoking status: Light Tobacco Smoker    Last attempt to quit: 01/04/2011    Years since quitting: 7.2  . Smokeless tobacco: Never Used  . Tobacco comment: 2-3 cigerettes a week.  Substance Use Topics  . Alcohol use: Yes    Alcohol/week: 0.0 oz    Comment: occasonal   . Drug use: Not Currently    Types: Marijuana    Allergies: No Known Allergies  Medications Prior to Admission  Medication Sig Dispense Refill Last Dose  . albuterol (PROVENTIL HFA;VENTOLIN HFA) 108 (90 Base) MCG/ACT inhaler Inhale 2 puffs into the lungs every 6 (six) hours as needed for wheezing or shortness of breath. 1 Inhaler 2 Past Week at Unknown time  . beclomethasone (QVAR) 40 MCG/ACT inhaler Inhale 2 puffs into the lungs 2 (two) times daily.    Past Month at Unknown time  . gabapentin (NEURONTIN) 300 MG capsule Take 1 capsule (300 mg total) by mouth 3 (three) times daily. (Patient taking differently: Take 300 mg by mouth 3 (three) times daily as needed (pain). ) 90 capsule 3 Past Week at Unknown time  .  ibuprofen (ADVIL,MOTRIN) 200 MG tablet Take 600 mg by mouth every 6 (six) hours as needed for mild pain.   03/25/2018 at Unknown time  . levothyroxine (SYNTHROID, LEVOTHROID) 50 MCG tablet Take 1 tablet (50 mcg total) by mouth daily. 90 tablet 0 03/25/2018 at Unknown time  . methocarbamol (ROBAXIN) 500 MG tablet Take 1 tablet (500 mg total) by mouth every 8 (eight) hours as needed for muscle spasms. 15 tablet 0 Past Week at Unknown time  . metoprolol tartrate (LOPRESSOR) 50 MG tablet Take 1 tablet (50 mg total) by mouth 2 (two) times daily. (Patient taking differently: Take 100 mg by mouth daily. ) 180 tablet 3 03/25/2018 at 1000  . hydrochlorothiazide (HYDRODIURIL) 25 MG tablet Take 1 tablet (25 mg total) by mouth daily. Must have office visit for refills (Patient not taking: Reported on 03/26/2018) 90 tablet 1 Not Taking at Unknown time  . ibuprofen (ADVIL,MOTRIN) 800 MG tablet Take 1 tablet (800 mg total) by mouth 3 (three) times daily. (Patient not taking: Reported on 03/26/2018) 21 tablet 0 Not Taking at Unknown time  . megestrol (MEGACE) 40 MG tablet Take one in the morning and one at night. (Patient not taking: Reported on 03/26/2018) 45 tablet 1 Not Taking at Unknown time    Review of Systems  Constitutional: Negative.   HENT: Negative.   Respiratory: Negative.   Cardiovascular: Negative.   Gastrointestinal: Positive for abdominal pain.  Genitourinary: Positive for pelvic pain and vaginal bleeding.  Neurological: Negative.    Physical Exam   Blood pressure 138/78, temperature 98.3 F (36.8 C), temperature source Oral, resp. rate 17.  Physical Exam  Constitutional: She is oriented to person, place, and time. She appears well-developed.  HENT:  Head: Normocephalic.  Neck: Normal range of motion.  Respiratory: Effort normal.  GI: Soft.  Genitourinary:  Genitourinary Comments: Normal external female genitalia; no lesions. Vaginal walls are pink with no lesions; no CMT, suprapubic or  adnexal masses. No blood or discharge in the vagina.   Musculoskeletal: Normal range of motion.  Neurological: She is alert and oriented to person, place, and time.  Skin: Skin is warm and dry.  Psychiatric: She has a normal mood and affect.    MAU Course  Procedures  MDM Preg test negative Wet prep done per patient request as she had  trichomonas in March; denies s/s of gonorrhea and chlamydia and does not feel she needs testing.  CBC not done as patient has no symptoms; will send patient's sample to pathology. Patient feels better after IM toradol; desires discharge.   Assessment and Plan   1. Dysfunctional uterine bleeding    2. Patient stable for discharge with plans to keep appt on 04-09-2018.   3. Per Dr. Alysia Penna, will take Megace 80 mg TID for 4 days and then switch to Megace 80 BID until she sees Dr. Marice Potter on the 28th.   4. Patient verbalized understanding; all questions answered.    Charlesetta Garibaldi Tammala Weider 03/26/2018, 3:52 PM

## 2018-03-30 ENCOUNTER — Telehealth: Payer: Self-pay

## 2018-03-30 NOTE — Telephone Encounter (Signed)
Pt called requesting to have results explained to her.  Pt did not stay what results.

## 2018-03-31 NOTE — Telephone Encounter (Signed)
Informed patient of surgical results.

## 2018-04-06 MED FILL — MEGESTROL 40 MG TABLET: 40 | 21 days supply | Qty: 90 | Fill #0

## 2018-04-09 ENCOUNTER — Encounter: Payer: Self-pay | Admitting: Obstetrics & Gynecology

## 2018-04-09 ENCOUNTER — Ambulatory Visit (INDEPENDENT_AMBULATORY_CARE_PROVIDER_SITE_OTHER): Payer: Self-pay | Admitting: Obstetrics & Gynecology

## 2018-04-09 ENCOUNTER — Encounter (HOSPITAL_COMMUNITY): Payer: Self-pay

## 2018-04-09 ENCOUNTER — Other Ambulatory Visit: Payer: Self-pay

## 2018-04-09 VITALS — BP 140/74 | HR 104 | Wt 359.0 lb

## 2018-04-09 DIAGNOSIS — N938 Other specified abnormal uterine and vaginal bleeding: Secondary | ICD-10-CM

## 2018-04-09 DIAGNOSIS — R102 Pelvic and perineal pain: Secondary | ICD-10-CM

## 2018-04-09 DIAGNOSIS — D649 Anemia, unspecified: Secondary | ICD-10-CM

## 2018-04-09 DIAGNOSIS — E282 Polycystic ovarian syndrome: Secondary | ICD-10-CM

## 2018-04-09 DIAGNOSIS — G8929 Other chronic pain: Secondary | ICD-10-CM

## 2018-04-09 DIAGNOSIS — R9389 Abnormal findings on diagnostic imaging of other specified body structures: Secondary | ICD-10-CM

## 2018-04-09 NOTE — Progress Notes (Signed)
   Subjective:    Patient ID: Lori Mcknight, female    DOB: [birth year], 32 y.o.   MRN: [id number]  HPI 32 yo single P0 here for 2 issues:  1) DUB. This has been going on since she was about 32 yo. She has been on megace at least 4 different times, still bleeding. 2)pelvic pain, daily, worse with her periods, started when she was a teenager. Nothing makes it better.  Review of Systems Abstinent for 3 months due to the pelvic pain/dyspareunia/bleeding When she has sex, she doesn't use contraception.    Objective:   Physical Exam Breathing, conversing, and ambulating normally Morbidly obese, well hydrated Black female, no apparent distress Abd- benign     Assessment & Plan:  Chronic pelvic pain- I suspect endometriosis and will plan for a diagnostic laparoscopy DUB with 3.6 cm endometrium. She is still with bleeding in spite of megace I will do a d&c  [place] notified

## 2018-04-12 ENCOUNTER — Encounter: Payer: Self-pay | Admitting: Nurse Practitioner

## 2018-04-12 ENCOUNTER — Ambulatory Visit: Payer: Self-pay | Attending: Nurse Practitioner | Admitting: Nurse Practitioner

## 2018-04-12 VITALS — BP 126/86 | HR 90 | Temp 98.9°F | Ht 66.0 in | Wt 358.8 lb

## 2018-04-12 DIAGNOSIS — Z833 Family history of diabetes mellitus: Secondary | ICD-10-CM | POA: Insufficient documentation

## 2018-04-12 DIAGNOSIS — Z6841 Body Mass Index (BMI) 40.0 and over, adult: Secondary | ICD-10-CM

## 2018-04-12 DIAGNOSIS — Z9889 Other specified postprocedural states: Secondary | ICD-10-CM | POA: Insufficient documentation

## 2018-04-12 DIAGNOSIS — J45909 Unspecified asthma, uncomplicated: Secondary | ICD-10-CM | POA: Insufficient documentation

## 2018-04-12 DIAGNOSIS — N920 Excessive and frequent menstruation with regular cycle: Secondary | ICD-10-CM | POA: Insufficient documentation

## 2018-04-12 DIAGNOSIS — I10 Essential (primary) hypertension: Secondary | ICD-10-CM | POA: Insufficient documentation

## 2018-04-12 DIAGNOSIS — Z791 Long term (current) use of non-steroidal anti-inflammatories (NSAID): Secondary | ICD-10-CM | POA: Insufficient documentation

## 2018-04-12 DIAGNOSIS — Z8249 Family history of ischemic heart disease and other diseases of the circulatory system: Secondary | ICD-10-CM | POA: Insufficient documentation

## 2018-04-12 DIAGNOSIS — M5442 Lumbago with sciatica, left side: Secondary | ICD-10-CM | POA: Insufficient documentation

## 2018-04-12 DIAGNOSIS — Z79899 Other long term (current) drug therapy: Secondary | ICD-10-CM | POA: Insufficient documentation

## 2018-04-12 DIAGNOSIS — E079 Disorder of thyroid, unspecified: Secondary | ICD-10-CM | POA: Insufficient documentation

## 2018-04-12 DIAGNOSIS — M5441 Lumbago with sciatica, right side: Secondary | ICD-10-CM | POA: Insufficient documentation

## 2018-04-12 DIAGNOSIS — G8929 Other chronic pain: Secondary | ICD-10-CM | POA: Insufficient documentation

## 2018-04-12 DIAGNOSIS — E282 Polycystic ovarian syndrome: Secondary | ICD-10-CM | POA: Insufficient documentation

## 2018-04-12 MED ORDER — METHOCARBAMOL 500 MG PO TABS: 500.0000 mg | ORAL_TABLET | Freq: Three times a day (TID) | ORAL | 1 refills | Status: AC | PRN

## 2018-04-12 MED ORDER — GABAPENTIN 300 MG PO CAPS: 300.0000 mg | ORAL_CAPSULE | Freq: Every day | ORAL | 1 refills | Status: DC

## 2018-04-12 MED ORDER — DULOXETINE HCL 60 MG PO CPEP: 60.0000 mg | ORAL_CAPSULE | Freq: Every day | ORAL | 3 refills | Status: DC

## 2018-04-12 NOTE — Patient Instructions (Signed)
Sciatica Sciatica is pain, numbness, weakness, or tingling along the path of the sciatic nerve. The sciatic nerve starts in the lower back and runs down the back of each leg. The nerve controls the muscles in the lower leg and in the back of the knee. It also provides feeling (sensation) to the back of the thigh, the lower leg, and the sole of the foot. Sciatica is a symptom of another medical condition that pinches or puts pressure on the sciatic nerve. Generally, sciatica only affects one side of the body. Sciatica usually goes away on its own or with treatment. In some cases, sciatica may keep coming back (recur). What are the causes? This condition is caused by pressure on the sciatic nerve, or pinching of the sciatic nerve. This may be the result of:  A disk in between the bones of the spine (vertebrae) bulging out too far (herniated disk).  Age-related changes in the spinal disks (degenerative disk disease).  A pain disorder that affects a muscle in the buttock (piriformis syndrome).  Extra bone growth (bone spur) near the sciatic nerve.  An injury or break (fracture) of the pelvis.  Pregnancy.  Tumor (rare).  What increases the risk? The following factors may make you more likely to develop this condition:  Playing sports that place pressure or stress on the spine, such as football or weight lifting.  Having poor strength and flexibility.  A history of back injury.  A history of back surgery.  Sitting for long periods of time.  Doing activities that involve repetitive bending or lifting.  Obesity.  What are the signs or symptoms? Symptoms can vary from mild to very severe, and they may include:  Any of these problems in the lower back, leg, hip, or buttock: ? Mild tingling or dull aches. ? Burning sensations. ? Sharp pains.  Numbness in the back of the calf or the sole of the foot.  Leg weakness.  Severe back pain that makes movement difficult.  These  symptoms may get worse when you cough, sneeze, or laugh, or when you sit or stand for long periods of time. Being overweight may also make symptoms worse. In some cases, symptoms may recur over time. How is this diagnosed? This condition may be diagnosed based on:  Your symptoms.  A physical exam. Your health care provider may ask you to do certain movements to check whether those movements trigger your symptoms.  You may have tests, including: ? Blood tests. ? X-rays. ? MRI. ? CT scan.  How is this treated? In many cases, this condition improves on its own, without any treatment. However, treatment may include:  Reducing or modifying physical activity during periods of pain.  Exercising and stretching to strengthen your abdomen and improve the flexibility of your spine.  Icing and applying heat to the affected area.  Medicines that help: ? To relieve pain and swelling. ? To relax your muscles.  Injections of medicines that help to relieve pain, irritation, and inflammation around the sciatic nerve (steroids).  Surgery.  Follow these instructions at home: Medicines  Take over-the-counter and prescription medicines only as told by your health care provider.  Do not drive or operate heavy machinery while taking prescription pain medicine. Managing pain  If directed, apply ice to the affected area. ? Put ice in a plastic bag. ? Place a towel between your skin and the bag. ? Leave the ice on for 20 minutes, 2-3 times a day.  After icing, apply   heat to the affected area before you exercise or as often as told by your health care provider. Use the heat source that your health care provider recommends, such as a moist heat pack or a heating pad. ? Place a towel between your skin and the heat source. ? Leave the heat on for 20-30 minutes. ? Remove the heat if your skin turns bright red. This is especially important if you are unable to feel pain, heat, or cold. You may have a  greater risk of getting burned. Activity  Return to your normal activities as told by your health care provider. Ask your health care provider what activities are safe for you. ? Avoid activities that make your symptoms worse.  Take brief periods of rest throughout the day. Resting in a lying or standing position is usually better than sitting to rest. ? When you rest for longer periods, mix in some mild activity or stretching between periods of rest. This will help to prevent stiffness and pain. ? Avoid sitting for long periods of time without moving. Get up and move around at least one time each hour.  Exercise and stretch regularly, as told by your health care provider.  Do not lift anything that is heavier than 10 lb (4.5 kg) while you have symptoms of sciatica. When you do not have symptoms, you should still avoid heavy lifting, especially repetitive heavy lifting.  When you lift objects, always use proper lifting technique, which includes: ? Bending your knees. ? Keeping the load close to your body. ? Avoiding twisting. General instructions  Use good posture. ? Avoid leaning forward while sitting. ? Avoid hunching over while standing.  Maintain a healthy weight. Excess weight puts extra stress on your back and makes it difficult to maintain good posture.  Wear supportive, comfortable shoes. Avoid wearing high heels.  Avoid sleeping on a mattress that is too soft or too hard. A mattress that is firm enough to support your back when you sleep may help to reduce your pain.  Keep all follow-up visits as told by your health care provider. This is important. Contact a health care provider if:  You have pain that wakes you up when you are sleeping.  You have pain that gets worse when you lie down.  Your pain is worse than you have experienced in the past.  Your pain lasts longer than 4 weeks.  You experience unexplained weight loss. Get help right away if:  You lose control  of your bowel or bladder (incontinence).  You have: ? Weakness in your lower back, pelvis, buttocks, or legs that gets worse. ? Redness or swelling of your back. ? A burning sensation when you urinate. This information is not intended to replace advice given to you by your health care provider. Make sure you discuss any questions you have with your health care provider. Document Released: 09/23/2001 Document Revised: 03/04/2016 Document Reviewed: 06/08/2015 Elsevier Interactive Patient Education  2018 [employer]  

## 2018-04-12 NOTE — Progress Notes (Signed)
Assessment & Plan:  Lori was seen today for follow-up.  Diagnoses and all orders for this visit:  Chronic midline low back pain with bilateral sciatica -     gabapentin (NEURONTIN) 300 MG capsule; Take 1 capsule (300 mg total) by mouth at bedtime. -     methocarbamol (ROBAXIN) 500 MG tablet; Take 1 tablet (500 mg total) by mouth every 8 (eight) hours as needed for muscle spasms. -     DULoxetine (CYMBALTA) 60 MG capsule; Take 1 capsule (60 mg total) by mouth daily. Work on losing weight to help reduce back pain. May alternate with heat and ice application for pain relief. May also alternate with acetaminophen and Ibuprofen as prescribed for back pain. Other alternatives include massage, acupuncture and water aerobics.  You must stay active and avoid a sedentary lifestyle.  Morbid Obesity Discussed diet and exercise for person with BMI >25. Instructed: You must burn more calories than you eat. Losing 5 percent of your body weight should be considered a success. In the longer term, losing more than 15 percent of your body weight and staying at this weight is an extremely good result. However, keep in mind that even losing 5 percent of your body weight leads to important health benefits, so try not to get discouraged if you're not able to lose more than this. Will recheck weight at next office visit.   Patient has been counseled on age-appropriate routine health concerns for screening and prevention. These are reviewed and up-to-date. Referrals have been placed accordingly. Immunizations are up-to-date or declined.    Subjective:   Chief Complaint  Patient presents with  . Follow-up    back pain   HPI Lori Mcknight 31 y.o. female presents to office today to follow up back pain.  Back Pain with bilateral sciatica Ongoing and chronic for over a year.  She has tried percocet, hydrocodone, tramadol, Robaxin, Voltaren, valium and most recently gabapentin with some relief of her pain. She  continues to smoke and is morbidly obese although she has lost about 6 lbs in the past few months. I have added Cymbalta to her gabapentin today. She will continue on Robaxin as well. She states Ibuprofen 600-800 provides no relief of her pain. She has not worked with physical therapy and still has not applied for the financial assistance program. She will need to speak with the financial counselor prior to any additional imaging or referrals placed. Some of her pain is likely related to her weight. One issue that prohibits her weight loss is that she is taking megace for menorrhagia with AUB. The other issue she reports is not being able to exercise regularly due to her back pain. Aggravating factors: Prolonged standing or sitting. She denies any recent trauma or injury but states she did fall down a flight of stairs as a young child.  Lumbar xray 10-29-2017 FINDINGS: The lumbar vertebral bodies are preserved in height. The pedicles and transverse processes are intact. The disc space heights are well maintained. There is no spondylolisthesis. There no significant facet joint hypertrophy. The observed portions of the sacrum are normal.  IMPRESSION: There is no acute or significant chronic bony abnormality of the lumbar spine.   Review of Systems  Constitutional: Negative for fever, malaise/fatigue and weight loss.  Respiratory: Negative.  Negative for cough and shortness of breath.   Cardiovascular: Negative.  Negative for chest pain, palpitations and leg swelling.  Gastrointestinal: Negative.  Negative for heartburn, nausea and vomiting.  Musculoskeletal: Positive for back pain and myalgias. Negative for falls.  Neurological: Positive for sensory change (sciatica to bilateral legs). Negative for dizziness, focal weakness, seizures and headaches.  Psychiatric/Behavioral: Negative.  Negative for suicidal ideas.    Past Medical History:  Diagnosis Date  . Anemia    history of anemia  .  Asthma    inhaler as needed  . Chest pain of uncertain etiology    intermittent left side chest pain  . Chlamydia   . Genital herpes   . Hypertension    no meds since April. needs new px per pt   . Morbid obesity (HCC)   . Obese   . Obesity, Class III, BMI 40-49.9 (morbid obesity) (HCC) 11/06/2011  . PCOS (polycystic ovarian syndrome)   . Shortness of breath    on exertion from weight  . Thyroid disease   . Urinary tract infection     Past Surgical History:  Procedure Laterality Date  . DILATION AND CURETTAGE OF UTERUS    . HYSTEROSCOPY W/D&C N/A 04/06/2014   Procedure: DILATATION AND CURETTAGE /HYSTEROSCOPY ;  Surgeon: Tereso NewcomerUgonna A Anyanwu, MD;  Location: [facility];  Service: Gynecology;  Laterality: N/A;  . LEFT HEART CATH AND CORONARY ANGIOGRAPHY N/A 12/12/2016   Procedure: Left Heart Cath and Coronary Angiography;  Surgeon: Peter M SwazilandJordan, MD;  [facility];  Service: Cardiovascular;  Laterality: N/A;  . NO PAST SURGERIES    . WISDOM TOOTH EXTRACTION      Family History  Problem Relation Age of Onset  . Heart disease Mother   . Sleep apnea Mother   . Diabetes Father   . Heart disease Father   . Heart disease Maternal Grandmother   . Diabetes Maternal Grandfather   . Heart disease Maternal Grandfather     Social History Reviewed with no changes to be made today.   Outpatient Medications Prior to Visit  Medication Sig Dispense Refill  . albuterol (PROVENTIL HFA;VENTOLIN HFA) 108 (90 Base) MCG/ACT inhaler Inhale 2 puffs into the lungs every 6 (six) hours as needed for wheezing or shortness of breath. 1 Inhaler 2  . beclomethasone (QVAR) 40 MCG/ACT inhaler Inhale 2 puffs into the lungs 2 (two) times daily.     Marland Kitchen. levothyroxine (SYNTHROID, LEVOTHROID) 50 MCG tablet Take 1 tablet (50 mcg total) by mouth daily. 90 tablet 0  . megestrol (MEGACE) 40 MG tablet Take 2 tablets (80 mg total) by mouth 3 (three) times daily. Take 80 mg three times a day for 4 days, then take  80 mg two days a day. 90 tablet 1  . gabapentin (NEURONTIN) 300 MG capsule Take 1 capsule (300 mg total) by mouth 3 (three) times daily. (Patient taking differently: Take 300 mg by mouth 3 (three) times daily as needed (pain). ) 90 capsule 3  . ibuprofen (ADVIL,MOTRIN) 200 MG tablet Take 600 mg by mouth every 6 (six) hours as needed for mild pain.    . methocarbamol (ROBAXIN) 500 MG tablet Take 1 tablet (500 mg total) by mouth every 8 (eight) hours as needed for muscle spasms. 15 tablet 0  . hydrochlorothiazide (HYDRODIURIL) 25 MG tablet Take 1 tablet (25 mg total) by mouth daily. Must have office visit for refills (Patient not taking: Reported on 03/26/2018) 90 tablet 1  . metoprolol tartrate (LOPRESSOR) 50 MG tablet Take 1 tablet (50 mg total) by mouth 2 (two) times daily. (Patient not taking: Reported on 04/12/2018) 180 tablet 3   No facility-administered medications  prior to visit.     No Known Allergies     Objective:    BP 126/86 (BP Location: Left Arm, Patient Position: Sitting, Cuff Size: Large) Comment (Cuff Size): thigh cuff  Pulse 90   Temp 98.9 F (37.2 C) (Oral)   Ht 5\' 6"  (1.676 m)   Wt (!) 358 lb 12.8 oz (162.8 kg)   SpO2 92%   BMI 57.91 kg/m  Wt Readings from Last 3 Encounters:  04/12/18 (!) 358 lb 12.8 oz (162.8 kg)  04/09/18 (!) 359 lb (162.8 kg)  02/18/18 (!) 363 lb (164.7 kg)    Physical Exam  Constitutional: She is oriented to person, place, and time. She appears well-developed and well-nourished. She is cooperative.  HENT:  Head: Normocephalic and atraumatic.  Eyes: EOM are normal.  Neck: Normal range of motion.  Cardiovascular: Normal rate, regular rhythm, normal heart sounds and intact distal pulses. Exam reveals no gallop and no friction rub.  No murmur heard. Pulmonary/Chest: Effort normal and breath sounds normal. No tachypnea. No respiratory distress. She has no decreased breath sounds. She has no wheezes. She has no rhonchi. She has no rales. She  exhibits no tenderness.  Abdominal: Bowel sounds are normal.  Musculoskeletal: Normal range of motion. She exhibits no edema, tenderness or deformity.       Lumbar back: She exhibits no bony tenderness, no swelling and no edema.  Neurological: She is alert and oriented to person, place, and time. Coordination normal.  Skin: Skin is warm and dry.  Psychiatric: She has a normal mood and affect. Her behavior is normal. Judgment and thought content normal.  Nursing note and vitals reviewed.      Patient has been counseled extensively about nutrition and exercise as well as the importance of adherence with medications and regular follow-up. The patient was given clear instructions to go to ER or return to medical center if symptoms don't improve, worsen or new problems develop. The patient verbalized understanding.   Follow-up: Return in about 6 weeks (around 05/24/2018) for chronic back pain.   Claiborne Rigg, FNP-BC [facility] [place], [place] [phone]   04/12/2018, 1:20 PM

## 2018-04-19 ENCOUNTER — Other Ambulatory Visit: Payer: Self-pay

## 2018-04-19 ENCOUNTER — Encounter (HOSPITAL_COMMUNITY): Payer: Self-pay

## 2018-04-19 ENCOUNTER — Encounter (HOSPITAL_COMMUNITY)
Admission: RE | Admit: 2018-04-19 | Discharge: 2018-04-19 | Disposition: A | Discharge status: Home / Self Care | Payer: Self-pay | Source: Ambulatory Visit | Attending: Obstetrics & Gynecology | Admitting: Obstetrics & Gynecology

## 2018-04-19 DIAGNOSIS — Z01818 Encounter for other preprocedural examination: Secondary | ICD-10-CM | POA: Insufficient documentation

## 2018-04-19 HISTORY — DX: Hypothyroidism, unspecified: E03.9

## 2018-04-19 HISTORY — DX: Headache, unspecified: R51.9

## 2018-04-19 HISTORY — DX: Anxiety disorder, unspecified: F41.9

## 2018-04-19 HISTORY — DX: Headache: R51

## 2018-04-19 HISTORY — DX: Major depressive disorder, single episode, unspecified: F32.9

## 2018-04-19 HISTORY — DX: Depression, unspecified: F32.A

## 2018-04-19 LAB — CBC
HCT: 36 % (ref 36.0–46.0)
Hemoglobin: 11.1 g/dL — ABNORMAL LOW (ref 12.0–15.0)
MCH: 25.2 pg — AB (ref 26.0–34.0)
MCHC: 30.8 g/dL (ref 30.0–36.0)
MCV: 81.8 fL (ref 78.0–100.0)
PLATELETS: 279 10*3/uL (ref 150–400)
RBC: 4.4 MIL/uL (ref 3.87–5.11)
RDW: 15.6 % — ABNORMAL HIGH (ref 11.5–15.5)
WBC: 6.3 10*3/uL (ref 4.0–10.5)

## 2018-04-19 LAB — BASIC METABOLIC PANEL
Anion gap: 9 (ref 5–15)
BUN: 9 mg/dL (ref 6–20)
CALCIUM: 8.8 mg/dL — AB (ref 8.9–10.3)
CHLORIDE: 107 mmol/L (ref 98–111)
CO2: 19 mmol/L — AB (ref 22–32)
Creatinine, Ser: 0.8 mg/dL (ref 0.44–1.00)
GFR calc non Af Amer: >60 mL/min (ref >60)
Glucose, Bld: 96 mg/dL (ref 70–99)
POTASSIUM: 3.7 mmol/L (ref 3.5–5.1)
Sodium: 135 mmol/L (ref 135–145)

## 2018-04-19 NOTE — Patient Instructions (Addendum)
Your procedure is scheduled on: Thursday April 29, 2018 at 7:30 am  Enter through [facility] at: 6:00 am  Pick up the phone at the desk and dial [phone].  Call this number if you have problems the morning of surgery: [phone].  Remember: Do NOT eat food or drink any liquids: after Midnight on Wednesday July 17  Take these medicines the morning of surgery with a SIP OF WATER: Cymbalta, metoprolol, Levothyroxine, Q var, Megace. May take albuterol inhaler, methocarbamol if neeced  Bring albuterol inhaler with you day of surgery  STOP ALL VITAMINS, SUPPLEMENTS, HERBAL MEDICATIONS NOW  DO NOT SMOKE DAY OF SURGERY  Do NOT wear jewelry (body piercing), metal hair clips/bobby pins, make-up, or nail polish. Do NOT wear lotions, powders, or perfumes.  You may wear deoderant. Do NOT shave for 48 hours prior to surgery. Do NOT bring valuables to the hospital. Contacts, dentures, or bridgework may not be worn into surgery.  Have a responsible adult drive you home and stay with you for 24 hours after your procedureYour procedure is scheduled on:

## 2018-04-19 NOTE — Pre-Procedure Instructions (Signed)
Dr. Sandford Craze. Jackson viewed and okay 'd EKG aware of history

## 2018-04-28 NOTE — Anesthesia Preprocedure Evaluation (Addendum)
Anesthesia Evaluation  Patient identified by MRN, date of birth, ID band Patient awake    Reviewed: Allergy & Precautions, NPO status , Patient's Chart, lab work & pertinent test results  History of Anesthesia Complications (+) PONV  Airway Mallampati: II  TM Distance: >3 FB Neck ROM: Full    Dental   Pulmonary shortness of breath, asthma , Current Smoker,    breath sounds clear to auscultation       Cardiovascular hypertension, Pt. on medications and Pt. on home beta blockers  Rhythm:Regular Rate:Normal     Neuro/Psych  Headaches,    GI/Hepatic negative GI ROS, Neg liver ROS,   Endo/Other  Hypothyroidism Morbid obesity  Renal/GU negative Renal ROS     Musculoskeletal   Abdominal   Peds  Hematology  (+) anemia ,   Anesthesia Other Findings   Reproductive/Obstetrics                            Lab Results  Component Value Date   WBC 6.3 04/19/2018   HGB 11.1 (L) 04/19/2018   HCT 36.0 04/19/2018   MCV 81.8 04/19/2018   PLT 279 04/19/2018   Lab Results  Component Value Date   CREATININE 0.80 04/19/2018   BUN 9 04/19/2018   NA 135 04/19/2018   K 3.7 04/19/2018   CL 107 04/19/2018   CO2 19 (L) 04/19/2018    Anesthesia Physical Anesthesia Plan  ASA: III  Anesthesia Plan: General   Post-op Pain Management:    Induction: Intravenous  PONV Risk Score and Plan: 3 and Midazolam, Dexamethasone, Ondansetron, Scopolamine patch - Pre-op and Treatment may vary due to age or medical condition  Airway Management Planned: Oral ETT and Video Laryngoscope Planned  Additional Equipment:   Intra-op Plan:   Post-operative Plan: Extubation in OR  Informed Consent: I have reviewed the patients History and Physical, chart, labs and discussed the procedure including the risks, benefits and alternatives for the proposed anesthesia with the patient or authorized representative who has indicated  his/her understanding and acceptance.   Dental advisory given  Plan Discussed with: CRNA  Anesthesia Plan Comments:        Anesthesia Quick Evaluation

## 2018-04-29 ENCOUNTER — Ambulatory Visit (HOSPITAL_COMMUNITY): Payer: Self-pay | Admitting: Anesthesiology

## 2018-04-29 ENCOUNTER — Encounter (HOSPITAL_COMMUNITY): Payer: Self-pay | Admitting: Emergency Medicine

## 2018-04-29 ENCOUNTER — Encounter (HOSPITAL_COMMUNITY)
Admission: RE | Disposition: A | Discharge status: Home / Self Care | Payer: Self-pay | Source: Ambulatory Visit | Attending: Obstetrics & Gynecology

## 2018-04-29 ENCOUNTER — Ambulatory Visit (HOSPITAL_COMMUNITY)
Admission: RE | Admit: 2018-04-29 | Discharge: 2018-04-29 | Disposition: A | Discharge status: Home / Self Care | Payer: Self-pay | Source: Ambulatory Visit | Attending: Obstetrics & Gynecology | Admitting: Obstetrics & Gynecology

## 2018-04-29 DIAGNOSIS — F419 Anxiety disorder, unspecified: Secondary | ICD-10-CM | POA: Insufficient documentation

## 2018-04-29 DIAGNOSIS — J45909 Unspecified asthma, uncomplicated: Secondary | ICD-10-CM | POA: Insufficient documentation

## 2018-04-29 DIAGNOSIS — N938 Other specified abnormal uterine and vaginal bleeding: Secondary | ICD-10-CM | POA: Insufficient documentation

## 2018-04-29 DIAGNOSIS — Z79899 Other long term (current) drug therapy: Secondary | ICD-10-CM | POA: Insufficient documentation

## 2018-04-29 DIAGNOSIS — R102 Pelvic and perineal pain: Secondary | ICD-10-CM | POA: Insufficient documentation

## 2018-04-29 DIAGNOSIS — F17200 Nicotine dependence, unspecified, uncomplicated: Secondary | ICD-10-CM | POA: Insufficient documentation

## 2018-04-29 DIAGNOSIS — N803 Endometriosis of pelvic peritoneum: Secondary | ICD-10-CM | POA: Insufficient documentation

## 2018-04-29 DIAGNOSIS — I10 Essential (primary) hypertension: Secondary | ICD-10-CM | POA: Insufficient documentation

## 2018-04-29 DIAGNOSIS — Z79818 Long term (current) use of other agents affecting estrogen receptors and estrogen levels: Secondary | ICD-10-CM | POA: Insufficient documentation

## 2018-04-29 DIAGNOSIS — Z6841 Body Mass Index (BMI) 40.0 and over, adult: Secondary | ICD-10-CM | POA: Insufficient documentation

## 2018-04-29 DIAGNOSIS — E039 Hypothyroidism, unspecified: Secondary | ICD-10-CM | POA: Insufficient documentation

## 2018-04-29 DIAGNOSIS — Z79890 Hormone replacement therapy (postmenopausal): Secondary | ICD-10-CM | POA: Insufficient documentation

## 2018-04-29 HISTORY — DX: Nausea with vomiting, unspecified: R11.2

## 2018-04-29 HISTORY — DX: Other specified postprocedural states: Z98.890

## 2018-04-29 HISTORY — PX: DILATION AND CURETTAGE OF UTERUS: SHX78

## 2018-04-29 HISTORY — PX: LAPAROSCOPY: SHX197

## 2018-04-29 LAB — HCG, SERUM, QUALITATIVE: PREG SERUM: NEGATIVE

## 2018-04-29 SURGERY — LAPAROSCOPY, DIAGNOSTIC
Anesthesia: General | Site: Vagina

## 2018-04-29 MED ORDER — PROPOFOL 10 MG/ML IV BOLUS
INTRAVENOUS | Status: DC | PRN
  Administered 2018-04-29: 320 mg via INTRAVENOUS

## 2018-04-29 MED ORDER — LIDOCAINE HCL (CARDIAC) PF 100 MG/5ML IV SOSY
PREFILLED_SYRINGE | INTRAVENOUS | Status: DC | PRN
  Administered 2018-04-29: 100 mg via INTRAVENOUS

## 2018-04-29 MED ORDER — ROCURONIUM BROMIDE 100 MG/10ML IV SOLN
INTRAVENOUS | Status: AC
  Filled 2018-04-29: qty 1

## 2018-04-29 MED ORDER — FENTANYL CITRATE (PF) 250 MCG/5ML IJ SOLN
INTRAMUSCULAR | Status: AC
  Filled 2018-04-29: qty 5

## 2018-04-29 MED ORDER — ROCURONIUM BROMIDE 100 MG/10ML IV SOLN
INTRAVENOUS | Status: DC | PRN
  Administered 2018-04-29: 50 mg via INTRAVENOUS

## 2018-04-29 MED ORDER — GLYCOPYRROLATE 0.2 MG/ML IJ SOLN
INTRAMUSCULAR | Status: DC | PRN
  Administered 2018-04-29 (×2): 0.1 mg via INTRAVENOUS

## 2018-04-29 MED ORDER — MIDAZOLAM HCL 2 MG/2ML IJ SOLN
INTRAMUSCULAR | Status: DC | PRN
  Administered 2018-04-29: 2 mg via INTRAVENOUS

## 2018-04-29 MED ORDER — KETOROLAC TROMETHAMINE 30 MG/ML IJ SOLN
INTRAMUSCULAR | Status: DC | PRN
  Administered 2018-04-29: 30 mg via INTRAVENOUS

## 2018-04-29 MED ORDER — KETOROLAC TROMETHAMINE 30 MG/ML IJ SOLN
INTRAMUSCULAR | Status: AC
  Filled 2018-04-29: qty 1

## 2018-04-29 MED ORDER — DEXAMETHASONE SODIUM PHOSPHATE 10 MG/ML IJ SOLN
INTRAMUSCULAR | Status: AC
  Filled 2018-04-29: qty 1

## 2018-04-29 MED ORDER — SCOPOLAMINE 1 MG/3DAYS TD PT72
1.0000 | MEDICATED_PATCH | Freq: Once | TRANSDERMAL | Status: DC
  Administered 2018-04-29: 1.5 mg via TRANSDERMAL

## 2018-04-29 MED ORDER — BUPIVACAINE HCL 0.5 % IJ SOLN
INTRAMUSCULAR | Status: DC | PRN
  Administered 2018-04-29: 17 mL

## 2018-04-29 MED ORDER — IBUPROFEN 600 MG PO TABS: 600.0000 mg | ORAL_TABLET | Freq: Four times a day (QID) | ORAL | 1 refills | Status: DC | PRN

## 2018-04-29 MED ORDER — FENTANYL CITRATE (PF) 100 MCG/2ML IJ SOLN
INTRAMUSCULAR | Status: AC
  Administered 2018-04-29: 25 ug via INTRAVENOUS
  Filled 2018-04-29: qty 2

## 2018-04-29 MED ORDER — FENTANYL CITRATE (PF) 100 MCG/2ML IJ SOLN
INTRAMUSCULAR | Status: DC | PRN
  Administered 2018-04-29 (×2): 100 ug via INTRAVENOUS
  Administered 2018-04-29: 50 ug via INTRAVENOUS

## 2018-04-29 MED ORDER — OXYCODONE-ACETAMINOPHEN 5-325 MG PO TABS: 1.0000 | ORAL_TABLET | Freq: Four times a day (QID) | ORAL | 0 refills | Status: DC | PRN

## 2018-04-29 MED ORDER — SCOPOLAMINE 1 MG/3DAYS TD PT72
MEDICATED_PATCH | TRANSDERMAL | Status: AC
  Administered 2018-04-29: 1.5 mg via TRANSDERMAL
  Filled 2018-04-29: qty 1

## 2018-04-29 MED ORDER — SILVER NITRATE-POT NITRATE 75-25 % EX MISC
CUTANEOUS | Status: DC | PRN
  Administered 2018-04-29: 2

## 2018-04-29 MED ORDER — ONDANSETRON HCL 4 MG/2ML IJ SOLN
INTRAMUSCULAR | Status: DC | PRN
  Administered 2018-04-29 (×2): 2 mg via INTRAVENOUS

## 2018-04-29 MED ORDER — GLYCOPYRROLATE 0.2 MG/ML IJ SOLN
INTRAMUSCULAR | Status: AC
  Filled 2018-04-29: qty 1

## 2018-04-29 MED ORDER — SUCCINYLCHOLINE CHLORIDE 20 MG/ML IJ SOLN
INTRAMUSCULAR | Status: DC | PRN
  Administered 2018-04-29: 160 mg via INTRAVENOUS

## 2018-04-29 MED ORDER — DEXAMETHASONE SODIUM PHOSPHATE 10 MG/ML IJ SOLN
INTRAMUSCULAR | Status: DC | PRN
  Administered 2018-04-29: 10 mg via INTRAVENOUS

## 2018-04-29 MED ORDER — SUCCINYLCHOLINE CHLORIDE 200 MG/10ML IV SOSY
PREFILLED_SYRINGE | INTRAVENOUS | Status: AC
  Filled 2018-04-29: qty 10

## 2018-04-29 MED ORDER — SUGAMMADEX SODIUM 500 MG/5ML IV SOLN
INTRAVENOUS | Status: DC | PRN
  Administered 2018-04-29: 500 mg via INTRAVENOUS

## 2018-04-29 MED ORDER — BUPIVACAINE HCL (PF) 0.5 % IJ SOLN
INTRAMUSCULAR | Status: AC
  Filled 2018-04-29: qty 60

## 2018-04-29 MED ORDER — LACTATED RINGERS IV SOLN
INTRAVENOUS | Status: DC
  Administered 2018-04-29: 07:00:00 via INTRAVENOUS

## 2018-04-29 MED ORDER — LIDOCAINE HCL (CARDIAC) PF 100 MG/5ML IV SOSY
PREFILLED_SYRINGE | INTRAVENOUS | Status: AC
  Filled 2018-04-29: qty 5

## 2018-04-29 MED ORDER — ONDANSETRON HCL 4 MG/2ML IJ SOLN: 4.0000 mg | Freq: Once | INTRAMUSCULAR | Status: DC | PRN

## 2018-04-29 MED ORDER — PROPOFOL 10 MG/ML IV BOLUS
INTRAVENOUS | Status: AC
  Filled 2018-04-29: qty 40

## 2018-04-29 MED ORDER — FENTANYL CITRATE (PF) 100 MCG/2ML IJ SOLN
25.0000 ug | INTRAMUSCULAR | Status: DC | PRN
  Administered 2018-04-29: 25 ug via INTRAVENOUS
  Administered 2018-04-29: 50 ug via INTRAVENOUS

## 2018-04-29 MED ORDER — SUGAMMADEX SODIUM 500 MG/5ML IV SOLN
INTRAVENOUS | Status: AC
  Filled 2018-04-29: qty 5

## 2018-04-29 MED ORDER — ONDANSETRON HCL 4 MG/2ML IJ SOLN
INTRAMUSCULAR | Status: AC
  Filled 2018-04-29: qty 2

## 2018-04-29 MED ORDER — MIDAZOLAM HCL 2 MG/2ML IJ SOLN
INTRAMUSCULAR | Status: AC
  Filled 2018-04-29: qty 2

## 2018-04-29 SURGICAL SUPPLY — 42 items
BAG SPEC RTRVL LRG 6X4 10 (ENDOMECHANICALS)
CABLE HIGH FREQUENCY MONO STRZ (ELECTRODE) IMPLANT
CNTNR SPEC C3OZ STD GRAD LEK (MISCELLANEOUS) ×2 IMPLANT
CONT SPEC 3OZ W/LID STRL (MISCELLANEOUS) ×3
DILATOR CANAL MILEX (MISCELLANEOUS) IMPLANT
DRSG OPSITE POSTOP 3X4 (GAUZE/BANDAGES/DRESSINGS) IMPLANT
DRSG TELFA 3X8 NADH (GAUZE/BANDAGES/DRESSINGS) ×3 IMPLANT
DURAPREP 26ML APPLICATOR (WOUND CARE) ×3 IMPLANT
ELECT REM PT RETURN 9FT ADLT (ELECTROSURGICAL)
ELECTRODE REM PT RTRN 9FT ADLT (ELECTROSURGICAL) IMPLANT
GLOVE BIO SURGEON STRL SZ 6.5 (GLOVE) ×6 IMPLANT
GLOVE BIOGEL PI IND STRL 7.0 (GLOVE) ×4 IMPLANT
GLOVE BIOGEL PI INDICATOR 7.0 (GLOVE) ×2
GOWN STRL REUS W/TWL LRG LVL3 (GOWN DISPOSABLE) ×6 IMPLANT
NDL SAFETY ECLIPSE 18X1.5 (NEEDLE) ×2 IMPLANT
NDL SPNL 18GX3.5 QUINCKE PK (NEEDLE) ×2 IMPLANT
NEEDLE HYPO 18GX1.5 SHARP (NEEDLE) ×3
NEEDLE INSUFFLATION 120MM (ENDOMECHANICALS) ×2 IMPLANT
NEEDLE SPNL 18GX3.5 QUINCKE PK (NEEDLE) ×3 IMPLANT
NS IRRIG 1000ML POUR BTL (IV SOLUTION) ×3 IMPLANT
PACK LAPAROSCOPY BASIN (CUSTOM PROCEDURE TRAY) ×3 IMPLANT
PACK TRENDGUARD 450 HYBRID PRO (MISCELLANEOUS) IMPLANT
PACK VAGINAL MINOR WOMEN LF (CUSTOM PROCEDURE TRAY) ×3 IMPLANT
PAD DRESSING TELFA 3X8 NADH (GAUZE/BANDAGES/DRESSINGS) IMPLANT
PAD OB MATERNITY 4.3X12.25 (PERSONAL CARE ITEMS) ×3 IMPLANT
PAD PREP 24X48 CUFFED NSTRL (MISCELLANEOUS) ×3 IMPLANT
POUCH SPECIMEN RETRIEVAL 10MM (ENDOMECHANICALS) IMPLANT
PROTECTOR NERVE ULNAR (MISCELLANEOUS) ×6 IMPLANT
SET IRRIG TUBING LAPAROSCOPIC (IRRIGATION / IRRIGATOR) IMPLANT
SHEARS HARMONIC ACE PLUS 36CM (ENDOMECHANICALS) IMPLANT
SLEEVE XCEL OPT CAN 5 100 (ENDOMECHANICALS) IMPLANT
STRIP CLOSURE SKIN 1/2X4 (GAUZE/BANDAGES/DRESSINGS) ×2 IMPLANT
SUT VICRYL 0 UR6 27IN ABS (SUTURE) IMPLANT
SUT VICRYL 4-0 PS2 18IN ABS (SUTURE) ×2 IMPLANT
TOWEL OR 17X24 6PK STRL BLUE (TOWEL DISPOSABLE) ×6 IMPLANT
TRENDGUARD 450 HYBRID PRO PACK (MISCELLANEOUS)
TROCAR 5M 150ML BLDLS (TROCAR) ×2 IMPLANT
TROCAR DILATING TIP 12MM 150MM (ENDOMECHANICALS) ×1 IMPLANT
TROCAR OPTI TIP 5M 100M (ENDOMECHANICALS) ×4 IMPLANT
TROCAR XCEL DIL TIP R 11M (ENDOMECHANICALS) IMPLANT
TUBING INSUF HEATED (TUBING) ×3 IMPLANT
WARMER LAPAROSCOPE (MISCELLANEOUS) ×3 IMPLANT

## 2018-04-29 NOTE — Discharge Instructions (Signed)
DISCHARGE INSTRUCTIONS: Laparoscopy ° °The following instructions have been prepared to help you care for yourself upon your return home today. ° °Wound care: °• Do not get the incision wet for the first 24 hours. The incision should be kept clean and dry. °• The Band-Aids or dressings may be removed the day after surgery. °• Should the incision become sore, red, and swollen after the first week, check with your doctor. ° °Personal hygiene: °• Shower the day after your procedure. ° °Activity and limitations: °• Do NOT drive or operate any equipment today. °• Do NOT lift anything more than 15 pounds for 2-3 weeks after surgery. °• Do NOT rest in bed all day. °• Walking is encouraged. Walk each day, starting slowly with 5-minute walks 3 or 4 times a day. Slowly increase the length of your walks. °• Walk up and down stairs slowly. °• Do NOT do strenuous activities, such as golfing, playing tennis, bowling, running, biking, weight lifting, gardening, mowing, or vacuuming for 2-4 weeks. Ask your doctor when it is okay to start. ° °Diet: Eat a light meal as desired this evening. You may resume your usual diet tomorrow. ° °Return to work: This is dependent on the type of work you do. For the most part you can return to a desk job within a week of surgery. If you are more active at work, please discuss this with your doctor. ° °What to expect after your surgery: You may have a slight burning sensation when you urinate on the first day. You may have a very small amount of blood in the urine. Expect to have a small amount of vaginal discharge/light bleeding for 1-2 weeks. It is not unusual to have abdominal soreness and bruising for up to 2 weeks. You may be tired and need more rest for about 1 week. You may experience shoulder pain for 24-72 hours. Lying flat in bed may relieve it. ° °Call your doctor for any of the following: °• Develop a fever of 100.4 or greater °• Inability to urinate 6 hours after discharge from  hospital °• Severe pain not relieved by pain medications °• Persistent of heavy bleeding at incision site °• Redness or swelling around incision site after a week °• Increasing nausea or vomiting ° °Patient Signature________________________________________ °Nurse Signature_________________________________________Dilation and Curettage or Vacuum Curettage, Care After °This sheet gives you information about how to care for yourself after your procedure. Your health care provider may also give you more specific instructions. If you have problems or questions, contact your health care provider. °What can I expect after the procedure? °After your procedure, it is common to have: °· Mild pain or cramping. °· Some vaginal bleeding or spotting. ° °These may last for up to 2 weeks after your procedure. °Follow these instructions at home: °Activity ° °· Do not drive or use heavy machinery while taking prescription pain medicine. °· Avoid driving for the first 24 hours after your procedure. °· Take frequent, short walks, followed by rest periods, throughout the day. Ask your health care provider what activities are safe for you. After 1-2 days, you may be able to return to your normal activities. °· Do not lift anything heavier than 10 lb (4.5 kg) until your health care provider approves. °· For at least 2 weeks, or as long as told by your health care provider, do not: °? Douche. °? Use tampons. °? Have sexual intercourse. °General instructions ° °· Take over-the-counter and prescription medicines only as told by your   health care provider. This is especially important if you take blood thinning medicine. °· Do not take baths, swim, or use a hot tub until your health care provider approves. Take showers instead of baths. °· Wear compression stockings as told by your health care provider. These stockings help to prevent blood clots and reduce swelling in your legs. °· It is your responsibility to get the results of your  procedure. Ask your health care provider, or the department performing the procedure, when your results will be ready. °· Keep all follow-up visits as told by your health care provider. This is important. °Contact a health care provider if: °· You have severe cramps that get worse or that do not get better with medicine. °· You have severe abdominal pain. °· You cannot drink fluids without vomiting. °· You develop pain in a different area of your pelvis. °· You have bad-smelling vaginal discharge. °· You have a rash. °Get help right away if: °· You have vaginal bleeding that soaks more than one sanitary pad in 1 hour, for 2 hours in a row. °· You pass large blood clots from your vagina. °· You have a fever that is above 100.4°F (38.0°C). °· Your abdomen feels very tender or hard. °· You have chest pain. °· You have shortness of breath. °· You cough up blood. °· You feel dizzy or light-headed. °· You faint. °· You have pain in your neck or shoulder area. °This information is not intended to replace advice given to you by your health care provider. Make sure you discuss any questions you have with your health care provider. °Document Released: 09/26/2000 Document Revised: 05/28/2016 Document Reviewed: 05/01/2016 °Elsevier Interactive Patient Education © 2018 [employer] ° ° °Diagnostic Laparoscopy, Care After °Refer to this sheet in the next few weeks. These instructions provide you with information about caring for yourself after your procedure. Your health care provider may also give you more specific instructions. Your treatment has been planned according to current medical practices, but problems sometimes occur. Call your health care provider if you have any problems or questions after your procedure. °What can I expect after the procedure? °After your procedure, it is common to have mild discomfort in the throat and abdomen. °Follow these instructions at home: °· Take over-the-counter and prescription  medicines only as told by your health care provider. °· Do not drive for 24 hours if you received a sedative. °· Return to your normal activities as told by your health care provider. °· Do not take baths, swim, or use a hot tub until your health care provider approves. You may shower. °· Follow instructions from your health care provider about how to take care of your incision. Make sure you: °? Wash your hands with soap and water before you change your bandage (dressing). If soap and water are not available, use hand sanitizer. °? Change your dressing as told by your health care provider. °? Leave stitches (sutures), skin glue, or adhesive strips in place. These skin closures may need to stay in place for 2 weeks or longer. If adhesive strip edges start to loosen and curl up, you may trim the loose edges. Do not remove adhesive strips completely unless your health care provider tells you to do that. °· Check your incision area every day for signs of infection. Check for: °? More redness, swelling, or pain. °? More fluid or blood. °? Warmth. °? Pus or a bad smell. °· It is your responsibility to get   the results of your procedure. Ask your health care provider or the department performing the procedure when your results will be ready. °Contact a health care provider if: °· There is new pain in your shoulders. °· You feel light-headed or faint. °· You are unable to pass gas or unable to have a bowel movement. °· You feel nauseous or you vomit. °· You develop a rash. °· You have more redness, swelling, or pain around your incision. °· You have more fluid or blood coming from your incision. °· Your incision feels warm to the touch. °· You have pus or a bad smell coming from your incision. °· You have a fever or chills. °Get help right away if: °· Your pain is getting worse. °· You have ongoing vomiting. °· The edges of your incision open up. °· You have trouble breathing. °· You have chest pain. °This information is  not intended to replace advice given to you by your health care provider. Make sure you discuss any questions you have with your health care provider. °Document Released: 09/10/2015 Document Revised: 03/06/2016 Document Reviewed: 06/12/2015 °Elsevier Interactive Patient Education © 2018 [employer] ° °

## 2018-04-29 NOTE — Anesthesia Procedure Notes (Signed)
Procedure Name: Intubation Date/Time: 04/29/2018 7:41 AM Performed by: Jonna Munro, CRNA Pre-anesthesia Checklist: Patient identified, Emergency Drugs available, Suction available, Patient being monitored and Timeout performed Patient Re-evaluated:Patient Re-evaluated prior to induction Oxygen Delivery Method: Circle system utilized Preoxygenation: Pre-oxygenation with 100% oxygen Induction Type: IV induction, Rapid sequence and Cricoid Pressure applied Laryngoscope Size: Mac and 3 Grade View: Grade I Tube type: Oral Tube size: 7.0 mm Number of attempts: 1 Airway Equipment and Method: Stylet Placement Confirmation: ETT inserted through vocal cords under direct vision,  positive ETCO2 and breath sounds checked- equal and bilateral Secured at: 22 cm Tube secured with: Tape Dental Injury: Teeth and Oropharynx as per pre-operative assessment

## 2018-04-29 NOTE — Op Note (Signed)
04/29/2018  8:29 AM  PATIENT:  Lori Mcknight  32 y.o. female  PRE-OPERATIVE DIAGNOSIS:  DUB, pelvic pain, morbid obesity   POST-OPERATIVE DIAGNOSIS:  Same plus stage 1 endometriosis  PROCEDURE:  Procedure(s): LAPAROSCOPY DIAGNOSTIC (N/A) DILATATION AND CURETTAGE (N/A)  SURGEON:  Surgeon(s) and Role:    * Pamella Samons C, MD - Primary   ANESTHESIA:   local and general  EBL:  10 mL   BLOOD ADMINISTERED:none  DRAINS: none   LOCAL MEDICATIONS USED:  MARCAINE     SPECIMEN:  Source of Specimen:  biopsies of posterior cul de sac and uterine curettings  DISPOSITION OF SPECIMEN:  PATHOLOGY  COUNTS:  YES  TOURNIQUET:  * No tourniquets in log *  DICTATION: .Dragon Dictation  PLAN OF CARE: Discharge to home after PACU  PATIENT DISPOSITION:  PACU - hemodynamically stable.   Delay start of Pharmacological VTE agent (>24hrs) due to surgical blood loss or risk of bleeding: not applicable   The risks, benefits, alternatives of surgery were explained understood, accepted. All questions were answered.  Her urine pregnancy test was negative. She was taken to the operating room and general anesthesia was applied without complication. She was placed in dorsal lithotomy position. Her abdomen and vagina were prepped and draped in the usual sterile fashion. A time out procedure was done. A bimanual exam revealed a normal, size and shape mobile uterus with nonenlarged adnexa. Her bladder was emptied with a Robinson catheter. A Hulka manipulator was placed on the cervix. Gloves were changed and attention was turned to the abdomen. Approximately 10 mL of 0.5% Marcaine was used to infiltrate the subcutaneous tissue at the umbilicus. A vertical 0.5 cm incision was made. A Veres needle was placed in the pelvis. Low-flow CO2 was used to insufflate the abdomen to approximately 3 L. After good pneumoperitoneum was established, a 5 mm trocar was placed. Laparoscopy confirmed correct placement. She was placed  in the Trendelenburg position. Patient abdominal pressure was always less than 15. Her uterus ovaries and tubes appeared normal. I placed a second 5 mm port under direct laparoscopic view in the left lower quadrant so that I could move the bowel out of the operative site. There were endometriotic implants in her posterior cul de sac. I removed 2 with a biopsy forceps. The third was too close to the right ureter so no attempt was made to remove it.   There was no bleeding. The CO2 was allowed to escape from her abdomen. The Hulka manipulator was removed.    A total of 7 mL of 0.5% Marcaine was used to perform a paracervical block. Her uterus sounded to 9 cm. Her cervix was carefully and slowly dilated to accommodate a small curette. A curettage was done in all quadrants and the fundus of the uterus. A large amount of polypoid-type  tissue was obtained. A gritty sensation was appreciated throughout. There was no bleeding noted at the end of the case. She was taken to the recovery room after being extubated. She tolerated the procedure well.

## 2018-04-29 NOTE — Anesthesia Postprocedure Evaluation (Signed)
Anesthesia Post Note  Patient: Lori Mcknight  Procedure(s) Performed: LAPAROSCOPY DIAGNOSTIC WITH PERITONEAL BIOPSY (N/A Abdomen) DILATATION AND CURETTAGE (N/A Vagina )     Patient location during evaluation: PACU Anesthesia Type: General Level of consciousness: awake and alert Pain management: pain level controlled Vital Signs Assessment: post-procedure vital signs reviewed and stable Respiratory status: spontaneous breathing, nonlabored ventilation, respiratory function stable and patient connected to nasal cannula oxygen Cardiovascular status: blood pressure returned to baseline and stable Postop Assessment: no apparent nausea or vomiting Anesthetic complications: no    Last Vitals:  Vitals:   04/29/18 1015 04/29/18 1110  BP: 119/79 131/72  Pulse: 75 71  Resp: 16 20  Temp:    SpO2: 93% 99%    Last Pain:  Vitals:   04/29/18 1110  TempSrc:   PainSc: 3    Pain Goal: Patients Stated Pain Goal: 5 (04/29/18 0628)               Kennieth RadFitzgerald, Madalyne Husk E

## 2018-04-29 NOTE — H&P (Signed)
Lori Mcknight is an 32 y.o. female. 32 yo single P0 here for 2 issues:  1) DUB. This has been going on since she was about 32 yo. She has been on megace at least 4 different times, still bleeding. 2)pelvic pain, daily, worse with her periods, started when she was a teenager. Nothing makes it better.  Her u/s showed a 3.6 cm endometrium.   Patient's last menstrual period was 03/24/2018.    Past Medical History:  Diagnosis Date  . Anemia    history of anemia  . Anxiety   . Asthma    inhaler as needed  . Chest pain of uncertain etiology    intermittent left side chest pain  . Chlamydia   . Depression   . Genital herpes   . Headache    MIGRAINES  . Hypertension    no meds since April. needs new px per pt   . Hypothyroidism   . Morbid obesity (HCC)   . Obese   . Obesity, Class III, BMI 40-49.9 (morbid obesity) (HCC) 11/06/2011  . PCOS (polycystic ovarian syndrome)   . PONV (postoperative nausea and vomiting)   . Shortness of breath    on exertion from weight  . Thyroid disease   . Urinary tract infection     Past Surgical History:  Procedure Laterality Date  . DILATION AND CURETTAGE OF UTERUS    . HYSTEROSCOPY W/D&C N/A 04/06/2014   Procedure: DILATATION AND CURETTAGE /HYSTEROSCOPY ;  Surgeon: Tereso NewcomerUgonna A Anyanwu, MD;  Location: [facility];  Service: Gynecology;  Laterality: N/A;  . LEFT HEART CATH AND CORONARY ANGIOGRAPHY N/A 12/12/2016   Procedure: Left Heart Cath and Coronary Angiography;  Surgeon: Peter M SwazilandJordan, MD;  [facility];  Service: Cardiovascular;  Laterality: N/A;  . NO PAST SURGERIES    . WISDOM TOOTH EXTRACTION      Family History  Problem Relation Age of Onset  . Heart disease Mother   . Sleep apnea Mother   . Diabetes Father   . Heart disease Father   . Heart disease Maternal Grandmother   . Diabetes Maternal Grandfather   . Heart disease Maternal Grandfather     Social History:  reports that she has been smoking.  She has never used  smokeless tobacco. She reports that she drinks alcohol. She reports that she has current or past drug history. Drug: Marijuana.  Allergies: No Known Allergies  Medications Prior to Admission  Medication Sig Dispense Refill Last Dose  . gabapentin (NEURONTIN) 300 MG capsule Take 1 capsule (300 mg total) by mouth at bedtime. (Patient taking differently: Take 300 mg by mouth every evening. ) 30 capsule 1 04/28/2018 at 1900  . levothyroxine (SYNTHROID, LEVOTHROID) 50 MCG tablet Take 1 tablet (50 mcg total) by mouth daily. (Patient taking differently: Take 50 mcg by mouth daily before breakfast. ) 90 tablet 0 Past Week at Unknown time  . megestrol (MEGACE) 40 MG tablet Take 2 tablets (80 mg total) by mouth 3 (three) times daily. Take 80 mg three times a day for 4 days, then take 80 mg two days a day. (Patient taking differently: Take 80 mg by mouth 2 (two) times daily. ) 90 tablet 1 04/29/2018 at 0535  . metoprolol tartrate (LOPRESSOR) 50 MG tablet Take 1 tablet (50 mg total) by mouth 2 (two) times daily. (Patient taking differently: Take 100 mg by mouth daily. ) 180 tablet 3 04/29/2018 at 0535  . albuterol (PROVENTIL HFA;VENTOLIN HFA) 108 (90  Base) MCG/ACT inhaler Inhale 2 puffs into the lungs every 6 (six) hours as needed for wheezing or shortness of breath. 1 Inhaler 2 More than a month at Unknown time  . beclomethasone (QVAR) 40 MCG/ACT inhaler Inhale 1 puff into the lungs 2 (two) times daily as needed (for shortness of breath).    More than a month at Unknown time  . DULoxetine (CYMBALTA) 60 MG capsule Take 1 capsule (60 mg total) by mouth daily. 30 capsule 3 Unknown at Unknown time  . hydrochlorothiazide (HYDRODIURIL) 25 MG tablet Take 1 tablet (25 mg total) by mouth daily. Must have office visit for refills (Patient not taking: Reported on 03/26/2018) 90 tablet 1 Not Taking at Unknown time  . methocarbamol (ROBAXIN) 500 MG tablet Take 1 tablet (500 mg total) by mouth every 8 (eight) hours as needed for  muscle spasms. (Patient taking differently: Take 500 mg by mouth 2 (two) times daily. ) 60 tablet 1 More than a month at Unknown time    ROS  Blood pressure 136/88, pulse 80, temperature 97.9 F (36.6 C), temperature source Oral, resp. rate 18, height 5\' 6"  (1.676 m), weight (!) 160.6 kg (354 lb), last menstrual period 03/24/2018, SpO2 99 %. Physical Exam Heart- rrr Lungs- CTAB Abd- benign  No results found for this or any previous visit (from the past 24 hour(s)).  No results found.  Assessment/Plan:  DUB- plan for D&C Chronic pelvic pain - plan for diagnostic laparoscopy  She understands the risks of surgery, including, but not to infection, bleeding, DVTs, damage to bowel, bladder, ureters. She wishes to proceed.    Allie Bossier 04/29/2018, 7:21 AM

## 2018-04-29 NOTE — Transfer of Care (Signed)
Immediate Anesthesia Transfer of Care Note  Patient: Lori Mcknight  Procedure(s) Performed: LAPAROSCOPY DIAGNOSTIC (N/A Abdomen) DILATATION AND CURETTAGE (N/A Vagina )  Patient Location: PACU  Anesthesia Type:General  Level of Consciousness: awake, alert  and oriented  Airway & Oxygen Therapy: Patient Spontanous Breathing and Patient connected to face mask oxygen  Post-op Assessment: Report given to RN and Post -op Vital signs reviewed and stable  Post vital signs: Reviewed and stable  Last Vitals:  Vitals Value Taken Time  BP    Temp    Pulse 83 04/29/2018  8:36 AM  Resp 20 04/29/2018  8:36 AM  SpO2 99 % 04/29/2018  8:36 AM  Vitals shown include unvalidated device data.  Last Pain:  Vitals:   04/29/18 0628  TempSrc: Oral  PainSc: 5       Patients Stated Pain Goal: 5 (04/29/18 [id number])  Complications: No apparent anesthesia complications

## 2018-04-30 ENCOUNTER — Encounter (HOSPITAL_COMMUNITY): Payer: Self-pay | Admitting: Obstetrics & Gynecology

## 2018-05-06 MED FILL — METHOCARBAMOL 500 MG TABS: 500 | 20 days supply | Qty: 60 | Fill #0

## 2018-05-07 ENCOUNTER — Emergency Department (HOSPITAL_COMMUNITY): Payer: Self-pay

## 2018-05-07 ENCOUNTER — Encounter (HOSPITAL_COMMUNITY): Payer: Self-pay | Admitting: Radiology

## 2018-05-07 ENCOUNTER — Emergency Department (HOSPITAL_COMMUNITY)
Admission: EM | Admit: 2018-05-07 | Discharge: 2018-05-07 | Disposition: A | Discharge status: Home / Self Care | Payer: Self-pay | Attending: Medical | Admitting: Medical

## 2018-05-07 ENCOUNTER — Other Ambulatory Visit: Payer: Self-pay

## 2018-05-07 DIAGNOSIS — R102 Pelvic and perineal pain: Secondary | ICD-10-CM | POA: Insufficient documentation

## 2018-05-07 DIAGNOSIS — R52 Pain, unspecified: Secondary | ICD-10-CM

## 2018-05-07 DIAGNOSIS — G8929 Other chronic pain: Secondary | ICD-10-CM | POA: Insufficient documentation

## 2018-05-07 DIAGNOSIS — Z79899 Other long term (current) drug therapy: Secondary | ICD-10-CM | POA: Insufficient documentation

## 2018-05-07 DIAGNOSIS — I10 Essential (primary) hypertension: Secondary | ICD-10-CM | POA: Insufficient documentation

## 2018-05-07 DIAGNOSIS — F17200 Nicotine dependence, unspecified, uncomplicated: Secondary | ICD-10-CM | POA: Insufficient documentation

## 2018-05-07 DIAGNOSIS — M545 Low back pain: Secondary | ICD-10-CM | POA: Insufficient documentation

## 2018-05-07 LAB — COMPREHENSIVE METABOLIC PANEL
ALK PHOS: 54 U/L (ref 38–126)
ALT: 17 U/L (ref 0–44)
ANION GAP: 5 (ref 5–15)
AST: 20 U/L (ref 15–41)
Albumin: 3.3 g/dL — ABNORMAL LOW (ref 3.5–5.0)
BUN: <5 mg/dL — ABNORMAL LOW (ref 6–20)
CALCIUM: 8.8 mg/dL — AB (ref 8.9–10.3)
CO2: 23 mmol/L (ref 22–32)
CREATININE: 0.76 mg/dL (ref 0.44–1.00)
Chloride: 113 mmol/L — ABNORMAL HIGH (ref 98–111)
GFR calc non Af Amer: >60 mL/min (ref >60)
GLUCOSE: 99 mg/dL (ref 70–99)
Potassium: 3.5 mmol/L (ref 3.5–5.1)
Sodium: 141 mmol/L (ref 135–145)
TOTAL PROTEIN: 7.1 g/dL (ref 6.5–8.1)
Total Bilirubin: 0.5 mg/dL (ref 0.3–1.2)

## 2018-05-07 LAB — CBC WITH DIFFERENTIAL/PLATELET
Abs Immature Granulocytes: 0 10*3/uL (ref 0.0–0.1)
BASOS ABS: 0 10*3/uL (ref 0.0–0.1)
BASOS PCT: 1 %
EOS PCT: 3 %
Eosinophils Absolute: 0.2 10*3/uL (ref 0.0–0.7)
HCT: 33.5 % — ABNORMAL LOW (ref 36.0–46.0)
HEMOGLOBIN: 9.8 g/dL — AB (ref 12.0–15.0)
Immature Granulocytes: 0 %
LYMPHS PCT: 41 %
Lymphs Abs: 2.8 10*3/uL (ref 0.7–4.0)
MCH: 24.5 pg — ABNORMAL LOW (ref 26.0–34.0)
MCHC: 29.3 g/dL — AB (ref 30.0–36.0)
MCV: 83.8 fL (ref 78.0–100.0)
Monocytes Absolute: 0.5 10*3/uL (ref 0.1–1.0)
Monocytes Relative: 7 %
NEUTROS ABS: 3.4 10*3/uL (ref 1.7–7.7)
Neutrophils Relative %: 48 %
Platelets: 276 10*3/uL (ref 150–400)
RBC: 4 MIL/uL (ref 3.87–5.11)
RDW: 15.7 % — ABNORMAL HIGH (ref 11.5–15.5)
WBC: 7 10*3/uL (ref 4.0–10.5)

## 2018-05-07 LAB — URINALYSIS, ROUTINE W REFLEX MICROSCOPIC
BILIRUBIN URINE: NEGATIVE
GLUCOSE, UA: NEGATIVE mg/dL
KETONES UR: NEGATIVE mg/dL
Leukocytes, UA: NEGATIVE
NITRITE: NEGATIVE
Protein, ur: NEGATIVE mg/dL
Specific Gravity, Urine: 1.013 (ref 1.005–1.030)
pH: 6 (ref 5.0–8.0)

## 2018-05-07 LAB — I-STAT BETA HCG BLOOD, ED (MC, WL, AP ONLY): I-stat hCG, quantitative: <5 m[IU]/mL (ref <5)

## 2018-05-07 LAB — LIPASE, BLOOD: Lipase: 126 U/L — ABNORMAL HIGH (ref 11–51)

## 2018-05-07 MED ORDER — HYDROMORPHONE HCL 1 MG/ML IJ SOLN
1.0000 mg | Freq: Once | INTRAMUSCULAR | Status: AC
Start: 2018-05-07 — End: 2018-05-07
  Administered 2018-05-07: 1 mg via INTRAVENOUS
  Filled 2018-05-07: qty 1

## 2018-05-07 MED ORDER — ONDANSETRON HCL 4 MG/2ML IJ SOLN
4.0000 mg | Freq: Once | INTRAMUSCULAR | Status: AC
  Administered 2018-05-07: 4 mg via INTRAVENOUS
  Filled 2018-05-07: qty 2

## 2018-05-07 MED ORDER — PREDNISONE 20 MG PO TABS: 40.0000 mg | ORAL_TABLET | Freq: Every day | ORAL | 0 refills | Status: AC

## 2018-05-07 MED ORDER — PREDNISONE 20 MG PO TABS
50.0000 mg | ORAL_TABLET | Freq: Once | ORAL | Status: AC
  Administered 2018-05-07: 50 mg via ORAL
  Filled 2018-05-07: qty 3

## 2018-05-07 MED ORDER — IOHEXOL 300 MG/ML  SOLN
100.0000 mL | Freq: Once | INTRAMUSCULAR | Status: AC | PRN
  Administered 2018-05-07: 100 mL via INTRAVENOUS

## 2018-05-07 MED ORDER — HYDROMORPHONE HCL 1 MG/ML IJ SOLN
1.0000 mg | Freq: Once | INTRAMUSCULAR | Status: AC
  Administered 2018-05-07: 1 mg via INTRAVENOUS
  Filled 2018-05-07: qty 1

## 2018-05-07 MED ORDER — OXYCODONE-ACETAMINOPHEN 5-325 MG PO TABS
1.0000 | ORAL_TABLET | Freq: Once | ORAL | Status: AC
  Administered 2018-05-07: 1 via ORAL
  Filled 2018-05-07: qty 1

## 2018-05-07 MED ORDER — OXYCODONE-ACETAMINOPHEN 5-325 MG PO TABS: 1.0000 | ORAL_TABLET | Freq: Four times a day (QID) | ORAL | 0 refills | Status: DC | PRN

## 2018-05-07 MED ORDER — LORAZEPAM 2 MG/ML IJ SOLN: 1.0000 mg | Freq: Once | INTRAMUSCULAR | Status: DC | PRN

## 2018-05-07 NOTE — ED Notes (Signed)
Pt ambulated to bathroom 

## 2018-05-07 NOTE — Discharge Instructions (Addendum)
The MRI shows that you have a bulging disc in your lower back.  Please take steroid medicine for the next 5 days.  You got your first dose in the ER and do not need to take another dose until tomorrow.  Follow-up with the neurosurgeon regarding your symptoms today.  I listed this information below.  You can continue taking muscle relaxer and Tylenol for pain.  I have also written you prescription for Percocet as needed.  This medicine can make you drowsy so please do not drive, work or drink alcohol while taking it.  No heavy lifting or strenuous activity.  Apply heat to the lower back to help with your symptoms.  You have anemia with hemoglobin 9.8, please follow-up with your regular doctor regarding this.  Return to the ER if you have any new or concerning symptoms like new weakness or numbness, unable to walk or fever.

## 2018-05-07 NOTE — ED Provider Notes (Signed)
[facility] EMERGENCY DEPARTMENT Provider Note   CSN: [id number] Arrival date & time: 05/07/18  0827     History   Chief Complaint Chief Complaint  Patient presents with  . Back Pain    HPI Lori Mcknight is a 32 y.o. female with a history of morbid obesity, dysfunctional uterine bleeding, chronic pelvic pain s/p laparoscopy on 7/18, and HTN who presents to the emergency department with a chief complaint of back pain.  The patient endorses constant sharp, stabbing midline low back pain that radiates to the bilateral sides of her low back and down her bilateral legs. She endorses similar pain for the last year, but her current pain significantly worsened on 7/20, 2 days after having a laparoscopy for chronic pelvic pain.  Pain is worse with positional changes.  She states that she has been unable to get out of her bed after sleeping on her back by sitting upright.  She has had to roll onto her side to attempt to get off the bed.  She had several episodes of urinary incontinence this morning, but no fecal incontinence.  She denies saddle paresthesia, fever, or chills.  She reports that she was diagnosed with sciatica in the emergency department about a year ago.  She has been taking muscle relaxers and gabapentin at home for pain control.  She has not followed up with neurosurgery because she has not had insurance.  She reports mild vaginal bleeding since her surgery.  She has not had a follow-up appointment since the procedure.  She reports abdominal pain that is worse around the surgical incision site that has not improved since the operation.  She denies dysuria, vaginal discharge, vaginal pain, diarrhea, constipation, melena, hematochezia, weakness, or numbness.  The history is provided by the patient. No language interpreter was used.    Past Medical History:  Diagnosis Date  . Anemia    history of anemia  . Anxiety   . Asthma    inhaler as needed  . Chest pain  of uncertain etiology    intermittent left side chest pain  . Chlamydia   . Depression   . Genital herpes   . Headache    MIGRAINES  . Hypertension    no meds since April. needs new px per pt   . Hypothyroidism   . Morbid obesity (HCC)   . Obese   . Obesity, Class III, BMI 40-49.9 (morbid obesity) (HCC) 11/06/2011  . PCOS (polycystic ovarian syndrome)   . PONV (postoperative nausea and vomiting)   . Shortness of breath    on exertion from weight  . Thyroid disease   . Urinary tract infection     Patient Active Problem List   Diagnosis Date Noted  . Incomplete bladder emptying 02/18/2018  . DUB (dysfunctional uterine bleeding) 02/18/2018  . Anemia 02/18/2018  . Hypothyroid 01/29/2017  . Migraines 08/26/2016  . Chest pain 02/04/2016  . Abnormal uterine bleeding (AUB) 01/09/2014  . Thickened endometrium 12/02/2013  . Essential hypertension 10/21/2013  . PCOS (polycystic ovarian syndrome) 11/06/2011  . Infertility, female 11/06/2011  . Pelvic pain in female 08/01/2011    Past Surgical History:  Procedure Laterality Date  . DILATION AND CURETTAGE OF UTERUS    . DILATION AND CURETTAGE OF UTERUS N/A 04/29/2018   Procedure: DILATATION AND CURETTAGE;  Surgeon: Allie Bossier, MD;  Location: [facility];  Service: Gynecology;  Laterality: N/A;  . HYSTEROSCOPY W/D&C N/A 04/06/2014   Procedure: DILATATION AND CURETTAGE /HYSTEROSCOPY ;  Surgeon: Tereso NewcomerUgonna A Anyanwu, MD;  Location: [facility];  Service: Gynecology;  Laterality: N/A;  . LAPAROSCOPY N/A 04/29/2018   Procedure: LAPAROSCOPY DIAGNOSTIC WITH PERITONEAL BIOPSY;  Surgeon: Allie Bossierove, Myra C, MD;  Location: [facility];  Service: Gynecology;  Laterality: N/A;  . LEFT HEART CATH AND CORONARY ANGIOGRAPHY N/A 12/12/2016   Procedure: Left Heart Cath and Coronary Angiography;  Surgeon: Peter M SwazilandJordan, MD;  [facility];  Service: Cardiovascular;  Laterality: N/A;  . NO PAST SURGERIES    . WISDOM TOOTH EXTRACTION       OB History    Gravida   0   Para      Term      Preterm      AB      Living        SAB      TAB      Ectopic      Multiple      Live Births               Home Medications    Prior to Admission medications   Medication Sig Start Date End Date Taking? Authorizing Provider  acetaminophen (TYLENOL) 500 MG tablet Take 1,000 mg by mouth every 6 (six) hours as needed for mild pain.   Yes [provider]  albuterol (PROVENTIL HFA;VENTOLIN HFA) 108 (90 Base) MCG/ACT inhaler Inhale 2 puffs into the lungs every 6 (six) hours as needed for wheezing or shortness of breath. 01/19/18  Yes Claiborne RiggFleming, Zelda W, NP  beclomethasone (QVAR) 40 MCG/ACT inhaler Inhale 1 puff into the lungs 2 (two) times daily as needed (for shortness of breath).    Yes [provider]  gabapentin (NEURONTIN) 300 MG capsule Take 1 capsule (300 mg total) by mouth at bedtime. Patient taking differently: Take 300 mg by mouth every evening.  04/12/18  Yes Claiborne RiggFleming, Zelda W, NP  ibuprofen (ADVIL,MOTRIN) 600 MG tablet Take 1 tablet (600 mg total) by mouth every 6 (six) hours as needed. 04/29/18  Yes Dove, Myra C, MD  levothyroxine (SYNTHROID, LEVOTHROID) 50 MCG tablet Take 1 tablet (50 mcg total) by mouth daily. Patient taking differently: Take 50 mcg by mouth daily before breakfast.  02/26/18  Yes Claiborne RiggFleming, Zelda W, NP  methocarbamol (ROBAXIN) 500 MG tablet Take 1 tablet (500 mg total) by mouth every 8 (eight) hours as needed for muscle spasms. Patient taking differently: Take 500 mg by mouth 2 (two) times daily.  04/14/18 05/14/18 Yes Claiborne RiggFleming, Zelda W, NP  metoprolol tartrate (LOPRESSOR) 50 MG tablet Take 1 tablet (50 mg total) by mouth 2 (two) times daily. Patient taking differently: Take 100 mg by mouth daily.  01/19/18  Yes Claiborne RiggFleming, Zelda W, NP  simethicone (MYLICON) 80 MG chewable tablet Chew 80 mg by mouth as needed for flatulence.   Yes [provider]  DULoxetine (CYMBALTA) 60 MG capsule Take 1 capsule (60 mg total) by  mouth daily. 04/12/18   Claiborne RiggFleming, Zelda W, NP  oxyCODONE-acetaminophen (PERCOCET/ROXICET) 5-325 MG tablet Take 1 tablet by mouth every 6 (six) hours as needed. Patient not taking: Reported on 05/07/2018 04/29/18   Allie Bossierove, Myra C, MD    Family History Family History  Problem Relation Age of Onset  . Heart disease Mother   . Sleep apnea Mother   . Diabetes Father   . Heart disease Father   . Heart disease Maternal Grandmother   . Diabetes Maternal Grandfather   . Heart disease Maternal Grandfather  Social History Social History   Tobacco Use  . Smoking status: Light Tobacco Smoker    Last attempt to quit: 01/04/2011    Years since quitting: 7.3  . Smokeless tobacco: Never Used  . Tobacco comment: 2-3 cigerettes a week.  Substance Use Topics  . Alcohol use: Yes    Alcohol/week: 0.0 oz    Comment: occasonal   . Drug use: Not Currently    Types: Marijuana     Allergies   Patient has no known allergies.   Review of Systems Review of Systems  Constitutional: Negative for activity change, chills and fever.  Eyes: Negative for visual disturbance.  Respiratory: Negative for shortness of breath.   Cardiovascular: Negative for chest pain.  Gastrointestinal: Positive for abdominal pain. Negative for constipation, diarrhea, nausea and vomiting.  Genitourinary: Positive for vaginal bleeding. Negative for dysuria and vaginal pain.  Musculoskeletal: Positive for arthralgias, back pain and myalgias. Negative for joint swelling, neck pain and neck stiffness.  Skin: Negative for rash and wound.  Allergic/Immunologic: Negative for immunocompromised state.  Neurological: Negative for dizziness, weakness, numbness and headaches.       Urinary incontinence  Psychiatric/Behavioral: Negative for confusion.   Physical Exam Updated Vital Signs BP (!) 100/54 (BP Location: Right Wrist)   Pulse 70   Temp 98.6 F (37 C) (Oral)   Resp 16   Ht 5\' 6"  (1.676 m)   Wt (!) 160.6 kg (354 lb)   LMP  03/24/2018   SpO2 99%   BMI 57.14 kg/m   Physical Exam  Constitutional: No distress.  Uncomfortable appearing.  She is laying on her left side and is having difficulty finding a comfortable position.  Morbidly obese female.  HENT:  Head: Normocephalic.  Eyes: Conjunctivae are normal.  Neck: Neck supple.  Cardiovascular: Normal rate, regular rhythm, normal heart sounds and intact distal pulses. Exam reveals no gallop and no friction rub.  No murmur heard. Pulmonary/Chest: Effort normal. No stridor. No respiratory distress. She has no wheezes. She has no rales. She exhibits no tenderness.  Abdominal: Soft. She exhibits no distension and no mass. There is tenderness. There is no rebound and no guarding. No hernia.  Obese abdomen.  Exam is somewhat limited secondary to body habitus.  Normoactive bowel sounds.  Diffusely tender to palpation to the left and right lower quadrants.  Upper abdomen is nontender.  No rebound or guarding.  There is a well-healing surgical incision in the periumbilical and right lower quadrant region.  No surrounding erythema, edema, or warmth.  No drainage.  She is diffusely tender around the site.  Musculoskeletal: She exhibits tenderness. She exhibits no edema or deformity.  No tenderness to the cervical or thoracic spinous processes.  She has midline tenderness to the lumbar and sacral spinous processes with bilateral paraspinal muscular tenderness.  Sensation is intact and equal to the bilateral lower extremity's.  DP and PT pulses are 2+ and symmetric.  4 out of 5 strength against resistance with dorsiflexion plantarflexion to the bilateral lower extremities.  Neurological: She is alert.  Skin: Skin is warm. No rash noted.  Psychiatric: Her behavior is normal.  Nursing note and vitals reviewed.  ED Treatments / Results  Labs (all labs ordered are listed, but only abnormal results are displayed) Labs Reviewed  CBC WITH DIFFERENTIAL/PLATELET - Abnormal; Notable  for the following components:      Result Value   Hemoglobin 9.8 (*)    HCT 33.5 (*)    MCH 24.5 (*)  MCHC 29.3 (*)    RDW 15.7 (*)    All other components within normal limits  COMPREHENSIVE METABOLIC PANEL - Abnormal; Notable for the following components:   Chloride 113 (*)    BUN <5 (*)    Calcium 8.8 (*)    Albumin 3.3 (*)    All other components within normal limits  LIPASE, BLOOD - Abnormal; Notable for the following components:   Lipase 126 (*)    All other components within normal limits  URINALYSIS, ROUTINE W REFLEX MICROSCOPIC - Abnormal; Notable for the following components:   APPearance HAZY (*)    Hgb urine dipstick SMALL (*)    Bacteria, UA RARE (*)    All other components within normal limits  I-STAT BETA HCG BLOOD, ED ([facility], [facility], [facility] ONLY)    EKG None  Radiology Ct Abdomen Pelvis W Contrast  Result Date: 05/07/2018 CLINICAL DATA:  32 year old female with lumbar spine pain and bilateral left greater than right radiculopathy. EXAM: CT ABDOMEN AND PELVIS WITH CONTRAST TECHNIQUE: Multidetector CT imaging of the abdomen and pelvis was performed using the standard protocol following bolus administration of intravenous contrast. CONTRAST:  OMNIPAQUE IOHEXOL 300 MG/ML  SOLN COMPARISON:  Lumbar spine radiographs 10/29/2017 FINDINGS: Lower chest: The lung bases are clear. Visualized cardiac structures are within normal limits for size. No pericardial effusion. Unremarkable visualized distal thoracic esophagus. Hepatobiliary: No focal liver abnormality is seen. No gallstones, gallbladder wall thickening, or biliary dilatation. Pancreas: Unremarkable. No pancreatic ductal dilatation or surrounding inflammatory changes. Spleen: Normal in size without focal abnormality. Adrenals/Urinary Tract: Adrenal glands are unremarkable. Kidneys are normal, without renal calculi, focal lesion, or hydronephrosis. Bladder is unremarkable. Stomach/Bowel: Stomach is within normal limits.  Appendix appears normal. No evidence of bowel wall thickening, distention, or inflammatory changes. Vascular/Lymphatic: No significant vascular findings are present. No enlarged abdominal or pelvic lymph nodes. Reproductive: Uterus and bilateral adnexa are unremarkable. Other: No abdominal wall hernia or abnormality. No abdominopelvic ascites. Musculoskeletal: No acute fracture or aggressive appearing lytic or blastic osseous lesion. Focal L5-S1 degenerative disc disease with a posterior disc bulge. IMPRESSION: 1. Focal L5-S1 degenerative disc disease with posterior disc bulging. 2. No acute abnormality within the abdomen or pelvis. Electronically Signed   By: Malachy Moan M.D.   On: 05/07/2018 12:43   Ct L-spine No Charge  Result Date: 05/07/2018 CLINICAL DATA:  Low back pain. BILATERAL LEFT greater than RIGHT leg pain. EXAM: CT LUMBAR SPINE WITHOUT CONTRAST TECHNIQUE: Multidetector CT imaging of the lumbar spine was performed without intravenous contrast administration. Multiplanar CT image reconstructions were also generated. COMPARISON:  Concurrent CT abdomen and pelvis reported separately. FINDINGS: Increased body habitus.  Image quality is reduced. Segmentation: Standard. Alignment: 4 mm retrolisthesis L5-S1, facet mediated. Otherwise anatomic. Vertebrae: No worrisome osseous lesion. Paraspinal and other soft tissues: Increased body habitus. CT abdomen and pelvis reported separately. Disc levels: The L1-2, L2-3, and L3-4 levels appear unremarkable. At L4-5, there is annular bulge with facet arthropathy. No definite stenosis or neural impingement. At L5-S1, there is 4 mm retrolisthesis, facet mediated. Central and leftward disc extrusion is present. Posterior element hypertrophy. There is stenosis with BILATERAL LEFT greater than RIGHT S1 nerve root impingement likely. Foraminal narrowing could affect either L5 nerve root. IMPRESSION: Increased body habitus.  Image quality is reduced. Potentially  symptomatic neural impingement at L5-S1 with 4 mm retrolisthesis, central and leftward extrusion, and facet arthropathy, with BILATERAL LEFT greater than RIGHT S1 nerve root impingement. Foraminal narrowing could affect  either L5 nerve root. Electronically Signed   By: Elsie Stain M.D.   On: 05/07/2018 12:39    Procedures Procedures (including critical care time)  Medications Ordered in ED Medications  LORazepam (ATIVAN) injection 1 mg (has no administration in time range)  HYDROmorphone (DILAUDID) injection 1 mg (1 mg Intravenous Given 05/07/18 1019)  ondansetron (ZOFRAN) injection 4 mg (4 mg Intravenous Given 05/07/18 1024)  iohexol (OMNIPAQUE) 300 MG/ML solution 100 mL (100 mLs Intravenous Contrast Given 05/07/18 1201)  HYDROmorphone (DILAUDID) injection 1 mg (1 mg Intravenous Given 05/07/18 1434)     Initial Impression / Assessment and Plan / ED Course  I have reviewed the triage vital signs and the nursing notes.  Pertinent labs & imaging results that were available during my care of the patient were reviewed by me and considered in my medical decision making (see chart for details).  Clinical Course as of May 08 1739  Fri May 07, 2018  1009 She is a 32 year old female with prior history of back problems here with worsening of her low back pain radiation down her legs left greater than right.  She has had some episodes of incontinence of urine.  She states this is happened in the past but this exacerbation seems worse.  She has just underwent laparoscopic surgery for endometriosis and is continuing to heal from that too.  We have ordered some CT imaging and some pain control and the nurses try to establish IV access now.   [MB]    Clinical Course User Index [MB] Terrilee Files, MD    32 year old female with a history of morbid obesity, dysfunctional uterine bleeding, chronic pelvic pain s/p laparoscopy on 7/18, and HTN presenting with acute on chronic bilateral low back pain with  urinary incontinence, onset today.  The patient did also undergo laparoscopy for chronic pelvic pain 8 days ago.  Her back pain significantly worsened shortly after the procedure was performed.  Hemoglobin 9.8, increased from her baseline of around 11.  She is also 8 days postop and has been having some vaginal bleeding, which is improving.  Lipase 126.  No history of pancreatitis and doubt pancreatitis as the source of her symptoms since the elevation is less than 3 times the upper limit of normal.  Pregnancy test is negative.  Given recent abdominal surgery and acute on chronic low back pain, CT abdomen pelvis and lumbar spine imaged.  CT demonstrating a potentially symptomatic neural impingement at L5-S1 with 4 mm of retrolithiasis with bilateral left greater than right S1 nerve root impingement there is also foraminal narrowing that could affect either L5 nerve root.  The patient was discussed with Dr. Charm Barges, attending physician.  Will order MRI lumbar spine to further assess findings on CT since the patient has new urinary incontinence.  Anticipate discharge to home with prednisone, pain medication, and neurosurgery follow-up. Patient care transferred to PA Shrosbree at the end of my shift. Patient presentation, ED course, and plan of care discussed with review of all pertinent labs and imaging. Please see his/her note for further details regarding further ED course and disposition.   Final Clinical Impressions(s) / ED Diagnoses   Final diagnoses:  Pain    ED Discharge Orders    None       Barkley Boards, PA-C 05/07/18 1740    Terrilee Files, MD 05/08/18 1045

## 2018-05-07 NOTE — ED Provider Notes (Signed)
Received sign out from PA Mia McDonald at shift change. Please see her H&P for further details.   MRI lumbar spine  1. Central disc protrusion with inferior migration at L5-S1,  contacting the left S1 nerve root in the left lateral recess.  Protruding disc also closely approximates and potentially irritates  the descending right S1 nerve root as well.  2. Otherwise negative MRI of the lumbar spine.   No cauda equina or other acute neurosurgical emergency. Patient will be discharged with steroid burst, short course of percocet and information to follow up with neurosurgery.  Have counseled her that Percocet can make her drowsy and she should not drive, work or drink alcohol while taking it.  Her hemoglobin was also lower than baseline today at 9.8.  She denies any symptoms like lightheadedness or shortness of breath.  I have counseled her to follow-up with her PCP regarding this.  On recheck she reports that her pain is improved and she is able to ambulate independently.  Counseled her on return precautions and she agrees and voices understanding to the above plan.    Lori Mcknight, Lori Fugitt J, PA-C 05/07/18 2102    Terrilee FilesButler, Michael C, MD 05/08/18 1046

## 2018-05-07 NOTE — ED Triage Notes (Signed)
Pt arrives via POV from home with lower back pain radiating down both legs. Left worse than right. Pt ambulatory. States hx of back pain but got worse after laproscopic surgery on July 18th. Denies recent fever. Reports some dysuria and at times urinary incontinence. States has taken gabapentin and muscle relaxers at home with little improvement in pain.

## 2018-05-10 MED FILL — predniSONE 20 MG TABS: 20 | 5 days supply | Qty: 10 | Fill #0

## 2018-05-17 MED FILL — METOPROLOL TARTRATE 50 MG T: 50 | 30 days supply | Qty: 60 | Fill #0

## 2018-05-17 MED FILL — LEVOTHYROXINE 50 MCG TABLET: 50 | 30 days supply | Qty: 30 | Fill #0

## 2018-05-24 ENCOUNTER — Telehealth: Payer: Self-pay | Admitting: Nurse Practitioner

## 2018-05-24 NOTE — Telephone Encounter (Signed)
Pt was cancel for 05/25/18 since Pt has Potential medicaid, Pt need to contact [facility]erv[facility]uan[facility] the potential medicaid, please call Mrs Marlynn PerkingMalloy at [phone].Marland Kitchen..Marland Kitchen

## 2018-05-25 ENCOUNTER — Ambulatory Visit: Payer: Self-pay

## 2018-05-25 ENCOUNTER — Ambulatory Visit: Payer: Self-pay | Attending: Nurse Practitioner | Admitting: Nurse Practitioner

## 2018-05-25 ENCOUNTER — Encounter: Payer: Self-pay | Admitting: Nurse Practitioner

## 2018-05-25 VITALS — BP 114/72 | HR 110 | Temp 99.1°F | Ht 66.0 in | Wt 364.0 lb

## 2018-05-25 DIAGNOSIS — Z79899 Other long term (current) drug therapy: Secondary | ICD-10-CM | POA: Insufficient documentation

## 2018-05-25 DIAGNOSIS — R000 Tachycardia, unspecified: Secondary | ICD-10-CM | POA: Insufficient documentation

## 2018-05-25 DIAGNOSIS — E282 Polycystic ovarian syndrome: Secondary | ICD-10-CM | POA: Insufficient documentation

## 2018-05-25 DIAGNOSIS — J45909 Unspecified asthma, uncomplicated: Secondary | ICD-10-CM | POA: Insufficient documentation

## 2018-05-25 DIAGNOSIS — G8929 Other chronic pain: Secondary | ICD-10-CM | POA: Insufficient documentation

## 2018-05-25 DIAGNOSIS — F329 Major depressive disorder, single episode, unspecified: Secondary | ICD-10-CM | POA: Insufficient documentation

## 2018-05-25 DIAGNOSIS — I10 Essential (primary) hypertension: Secondary | ICD-10-CM | POA: Insufficient documentation

## 2018-05-25 DIAGNOSIS — M5442 Lumbago with sciatica, left side: Secondary | ICD-10-CM | POA: Insufficient documentation

## 2018-05-25 DIAGNOSIS — M5441 Lumbago with sciatica, right side: Secondary | ICD-10-CM | POA: Insufficient documentation

## 2018-05-25 DIAGNOSIS — F419 Anxiety disorder, unspecified: Secondary | ICD-10-CM | POA: Insufficient documentation

## 2018-05-25 DIAGNOSIS — E039 Hypothyroidism, unspecified: Secondary | ICD-10-CM | POA: Insufficient documentation

## 2018-05-25 DIAGNOSIS — Z6841 Body Mass Index (BMI) 40.0 and over, adult: Secondary | ICD-10-CM | POA: Insufficient documentation

## 2018-05-25 MED ORDER — DULOXETINE HCL 60 MG PO CPEP: 60.0000 mg | ORAL_CAPSULE | Freq: Every day | ORAL | 3 refills | Status: DC

## 2018-05-25 MED ORDER — METOPROLOL SUCCINATE ER 100 MG PO TB24: 100.0000 mg | ORAL_TABLET | Freq: Every day | ORAL | 3 refills | Status: DC

## 2018-05-25 MED ORDER — METHOCARBAMOL 500 MG PO TABS: 500.0000 mg | ORAL_TABLET | Freq: Four times a day (QID) | ORAL | 2 refills | Status: DC | PRN

## 2018-05-25 NOTE — Patient Instructions (Signed)
Back Exercises If you have pain in your back, do these exercises 2-3 times each day or as told by your doctor. When the pain goes away, do the exercises once each day, but repeat the steps more times for each exercise (do more repetitions). If you do not have pain in your back, do these exercises once each day or as told by your doctor. Exercises Single Knee to Chest  Do these steps 3-5 times in a row for each leg: 1. Lie on your back on a firm bed or the floor with your legs stretched out. 2. Bring one knee to your chest. 3. Hold your knee to your chest by grabbing your knee or thigh. 4. Pull on your knee until you feel a gentle stretch in your lower back. 5. Keep doing the stretch for 10-30 seconds. 6. Slowly let go of your leg and straighten it.  Pelvic Tilt  Do these steps 5-10 times in a row: 1. Lie on your back on a firm bed or the floor with your legs stretched out. 2. Bend your knees so they point up to the ceiling. Your feet should be flat on the floor. 3. Tighten your lower belly (abdomen) muscles to press your lower back against the floor. This will make your tailbone point up to the ceiling instead of pointing down to your feet or the floor. 4. Stay in this position for 5-10 seconds while you gently tighten your muscles and breathe evenly.  Cat-Cow  Do these steps until your lower back bends more easily: 1. Get on your hands and knees on a firm surface. Keep your hands under your shoulders, and keep your knees under your hips. You may put padding under your knees. 2. Let your head hang down, and make your tailbone point down to the floor so your lower back is round like the back of a cat. 3. Stay in this position for 5 seconds. 4. Slowly lift your head and make your tailbone point up to the ceiling so your back hangs low (sags) like the back of a cow. 5. Stay in this position for 5 seconds.  Press-Ups  Do these steps 5-10 times in a row: 1. Lie on your belly (face-down)  on the floor. 2. Place your hands near your head, about shoulder-width apart. 3. While you keep your back relaxed and keep your hips on the floor, slowly straighten your arms to raise the top half of your body and lift your shoulders. Do not use your back muscles. To make yourself more comfortable, you may change where you place your hands. 4. Stay in this position for 5 seconds. 5. Slowly return to lying flat on the floor.  Bridges  Do these steps 10 times in a row: 1. Lie on your back on a firm surface. 2. Bend your knees so they point up to the ceiling. Your feet should be flat on the floor. 3. Tighten your butt muscles and lift your butt off of the floor until your waist is almost as high as your knees. If you do not feel the muscles working in your butt and the back of your thighs, slide your feet 1-2 inches farther away from your butt. 4. Stay in this position for 3-5 seconds. 5. Slowly lower your butt to the floor, and let your butt muscles relax.  If this exercise is too easy, try doing it with your arms crossed over your chest. Belly Crunches  Do these steps 5-10 times in   a row: 1. Lie on your back on a firm bed or the floor with your legs stretched out. 2. Bend your knees so they point up to the ceiling. Your feet should be flat on the floor. 3. Cross your arms over your chest. 4. Tip your chin a little bit toward your chest but do not bend your neck. 5. Tighten your belly muscles and slowly raise your chest just enough to lift your shoulder blades a tiny bit off of the floor. 6. Slowly lower your chest and your head to the floor.  Back Lifts Do these steps 5-10 times in a row: 1. Lie on your belly (face-down) with your arms at your sides, and rest your forehead on the floor. 2. Tighten the muscles in your legs and your butt. 3. Slowly lift your chest off of the floor while you keep your hips on the floor. Keep the back of your head in line with the curve in your back. Look at  the floor while you do this. 4. Stay in this position for 3-5 seconds. 5. Slowly lower your chest and your face to the floor.  Contact a doctor if:  Your back pain gets a lot worse when you do an exercise.  Your back pain does not lessen 2 hours after you exercise. If you have any of these problems, stop doing the exercises. Do not do them again unless your doctor says it is okay. Get help right away if:  You have sudden, very bad back pain. If this happens, stop doing the exercises. Do not do them again unless your doctor says it is okay. This information is not intended to replace advice given to you by your health care provider. Make sure you discuss any questions you have with your health care provider. Document Released: 11/01/2010 Document Revised: 03/06/2016 Document Reviewed: 11/23/2014 Elsevier Interactive Patient Education  2018 [employer] Back Pain, Adult Many adults have back pain from time to time. Common causes of back pain include:  A strained muscle or ligament.  Wear and tear (degeneration) of the spinal disks.  Arthritis.  A hit to the back.  Back pain can be short-lived (acute) or last a long time (chronic). A physical exam, lab tests, and imaging studies may be done to find the cause of your pain. Follow these instructions at home: Managing pain and stiffness  Take over-the-counter and prescription medicines only as told by your health care provider.  If directed, apply heat to the affected area as often as told by your health care provider. Use the heat source that your health care provider recommends, such as a moist heat pack or a heating pad. ? Place a towel between your skin and the heat source. ? Leave the heat on for 20-30 minutes. ? Remove the heat if your skin turns bright red. This is especially important if you are unable to feel pain, heat, or cold. You have a greater risk of getting burned.  If directed, apply ice to the injured area: ? Put  ice in a plastic bag. ? Place a towel between your skin and the bag. ? Leave the ice on for 20 minutes, 2-3 times a day for the first 2-3 days. Activity  Do not stay in bed. Resting more than 1-2 days can delay your recovery.  Take short walks on even surfaces as soon as you are able. Try to increase the length of time you walk each day.  Do not sit, drive, or   stand in one place for more than 30 minutes at a time. Sitting or standing for long periods of time can put stress on your back.  Use proper lifting techniques. When you bend and lift, use positions that put less stress on your back: ? Bend your knees. ? Keep the load close to your body. ? Avoid twisting.  Exercise regularly as told by your health care provider. Exercising will help your back heal faster. This also helps prevent back injuries by keeping muscles strong and flexible.  Your health care provider may recommend that you see a physical therapist. This person can help you come up with a safe exercise program. Do any exercises as told by your physical therapist. Lifestyle  Maintain a healthy weight. Extra weight puts stress on your back and makes it difficult to have good posture.  Avoid activities or situations that make you feel anxious or stressed. Learn ways to manage anxiety and stress. One way to manage stress is through exercise. Stress and anxiety increase muscle tension and can make back pain worse. General instructions  Sleep on a firm mattress in a comfortable position. Try lying on your side with your knees slightly bent. If you lie on your back, put a pillow under your knees.  Follow your treatment plan as told by your health care provider. This may include: ? Cognitive or behavioral therapy. ? Acupuncture or massage therapy. ? Meditation or yoga. Contact a health care provider if:  You have pain that is not relieved with rest or medicine.  You have increasing pain going down into your legs or  buttocks.  Your pain does not improve in 2 weeks.  You have pain at night.  You lose weight.  You have a fever or chills. Get help right away if:  You develop new bowel or bladder control problems.  You have unusual weakness or numbness in your arms or legs.  You develop nausea or vomiting.  You develop abdominal pain.  You feel faint. Summary  Many adults have back pain from time to time. A physical exam, lab tests, and imaging studies may be done to find the cause of your pain.  Use proper lifting techniques. When you bend and lift, use positions that put less stress on your back.  Take over-the-counter and prescription medicines and apply heat or ice as directed by your health care provider. This information is not intended to replace advice given to you by your health care provider. Make sure you discuss any questions you have with your health care provider. Document Released: 09/29/2005 Document Revised: 11/03/2016 Document Reviewed: 11/03/2016 Elsevier Interactive Patient Education  2018 Elsevier Inc[employer]  

## 2018-05-25 NOTE — Progress Notes (Signed)
Assessment & Plan:  Lori was seen today for follow-up.  Diagnoses and all orders for this visit:  Hypothyroidism, unspecified type -     TSH  Chronic midline low back pain with bilateral sciatica -     DULoxetine (CYMBALTA) 60 MG capsule; Take 1 capsule (60 mg total) by mouth daily. -     methocarbamol (ROBAXIN) 500 MG tablet; Take 1 tablet (500 mg total) by mouth every 6 (six) hours as needed for muscle spasms. Work on losing weight to help reduce back pain. May alternate with heat and ice application for pain relief. May also alternate with acetaminophen and Ibuprofen as prescribed for back pain. Other alternatives include massage, acupuncture and water aerobics.  You must stay active and avoid a sedentary lifestyle.   Tachycardia -     D-dimer, quantitative (not at [facility]) -     metoprolol succinate (TOPROL-XL) 100 MG 24 hr tablet; Take 1 tablet (100 mg total) by mouth daily. Take with or immediately following a meal.    Patient has been counseled on age-appropriate routine health concerns for screening and prevention. These are reviewed and up-to-date. Referrals have been placed accordingly. Immunizations are up-to-date or declined.    Subjective:   Chief Complaint  Patient presents with  . Follow-up    Pt. is here for a follow-up on back pain. Pt. stated her back pain have not been improve.    HPI Lori Mcknight 32 y.o. female presents to office today for follow up to back pain.     Low Back Pain MRI lumbar spine 05-07-2018 1. Central disc protrusion with inferior migration at L5-S1,  contacting the left S1 nerve root in the left lateral recess.  Protruding disc also closely approximates and potentially irritates  the descending right S1 nerve root as well.  2. Otherwise negative MRI of the lumbar spine.  She endorses chest pain, increased shortness of breath and persistent low back and bilateral leg pain. She is also tachycardic today. Will order D-dimer. She was  prescribed Cymbalta at her last appointment with little relief of her back pain. Back pain is chronic and ongoing for over a year. Treatments tired include: percocet, hydrocodone, tramadol, robaxin, voltaren, valium and gabapentin. Her weight is greatly contributing to her back pain. She also continues to smoke. She does not exercise. Will continue current medication regimen at this time of gabapentin, methocarbamol and cymbalta.  She needs to see a spine specialist however she has not applied for the financial assistance program. Patient has been advised to apply for financial assistance and schedule to see our financial counselor.   Hypothyroidism Patient presents for evaluation of thyroid function. Symptoms consist of weight gain. The symptoms are mild and stable.  The problem has been controlled.  Previous thyroid studies include TSH. The hypothyroidism is due to hypothyroidism.   Review of Systems  Constitutional: Negative for fever, malaise/fatigue and weight loss.       Weight gain  HENT: Negative.  Negative for nosebleeds.   Eyes: Negative.  Negative for blurred vision, double vision and photophobia.  Respiratory: Negative.  Negative for cough and shortness of breath.   Cardiovascular: Positive for chest pain. Negative for palpitations and leg swelling.  Gastrointestinal: Negative.  Negative for heartburn, nausea and vomiting.  Musculoskeletal: Positive for back pain and myalgias.  Neurological: Negative.  Negative for dizziness, focal weakness, seizures and headaches.  Psychiatric/Behavioral: Negative.  Negative for suicidal ideas.    Past Medical History:  Diagnosis Date  .  Anemia    history of anemia  . Anxiety   . Asthma    inhaler as needed  . Chest pain of uncertain etiology    intermittent left side chest pain  . Chlamydia   . Depression   . Genital herpes   . Headache    MIGRAINES  . Hypertension    no meds since April. needs new px per pt   . Hypothyroidism   .  Morbid obesity (HCC)   . Obese   . Obesity, Class III, BMI 40-49.9 (morbid obesity) (HCC) 11/06/2011  . PCOS (polycystic ovarian syndrome)   . PONV (postoperative nausea and vomiting)   . Shortness of breath    on exertion from weight  . Thyroid disease   . Urinary tract infection     Past Surgical History:  Procedure Laterality Date  . DILATION AND CURETTAGE OF UTERUS    . DILATION AND CURETTAGE OF UTERUS N/A 04/29/2018   Procedure: DILATATION AND CURETTAGE;  Surgeon: Allie Bossierove, Myra C, MD;  Location: [facility];  Service: Gynecology;  Laterality: N/A;  . HYSTEROSCOPY W/D&C N/A 04/06/2014   Procedure: DILATATION AND CURETTAGE /HYSTEROSCOPY ;  Surgeon: Tereso NewcomerUgonna A Anyanwu, MD;  Location: [facility];  Service: Gynecology;  Laterality: N/A;  . LAPAROSCOPY N/A 04/29/2018   Procedure: LAPAROSCOPY DIAGNOSTIC WITH PERITONEAL BIOPSY;  Surgeon: Allie Bossierove, Myra C, MD;  Location: [facility];  Service: Gynecology;  Laterality: N/A;  . LEFT HEART CATH AND CORONARY ANGIOGRAPHY N/A 12/12/2016   Procedure: Left Heart Cath and Coronary Angiography;  Surgeon: Peter M SwazilandJordan, MD;  [facility];  Service: Cardiovascular;  Laterality: N/A;  . NO PAST SURGERIES    . WISDOM TOOTH EXTRACTION      Family History  Problem Relation Age of Onset  . Heart disease Mother   . Sleep apnea Mother   . Diabetes Father   . Heart disease Father   . Heart disease Maternal Grandmother   . Diabetes Maternal Grandfather   . Heart disease Maternal Grandfather     Social History Reviewed with no changes to be made today.   Outpatient Medications Prior to Visit  Medication Sig Dispense Refill  . acetaminophen (TYLENOL) 500 MG tablet Take 1,000 mg by mouth every 6 (six) hours as needed for mild pain.    Marland Kitchen. albuterol (PROVENTIL HFA;VENTOLIN HFA) 108 (90 Base) MCG/ACT inhaler Inhale 2 puffs into the lungs every 6 (six) hours as needed for wheezing or shortness of breath. 1 Inhaler 2  . beclomethasone (QVAR) 40 MCG/ACT inhaler Inhale 1  puff into the lungs 2 (two) times daily as needed (for shortness of breath).     Marland Kitchen. ibuprofen (ADVIL,MOTRIN) 600 MG tablet Take 1 tablet (600 mg total) by mouth every 6 (six) hours as needed. 30 tablet 1  . levothyroxine (SYNTHROID, LEVOTHROID) 50 MCG tablet Take 1 tablet (50 mcg total) by mouth daily. (Patient taking differently: Take 50 mcg by mouth daily before breakfast. ) 90 tablet 0  . metoprolol tartrate (LOPRESSOR) 50 MG tablet Take 1 tablet (50 mg total) by mouth 2 (two) times daily. (Patient taking differently: Take 100 mg by mouth daily. ) 180 tablet 3  . gabapentin (NEURONTIN) 300 MG capsule Take 1 capsule (300 mg total) by mouth at bedtime. (Patient not taking: Reported on 05/25/2018) 30 capsule 1  . simethicone (MYLICON) 80 MG chewable tablet Chew 80 mg by mouth as needed for flatulence.    . DULoxetine (CYMBALTA) 60 MG capsule Take 1 capsule (  60 mg total) by mouth daily. (Patient not taking: Reported on 05/25/2018) 30 capsule 3  . oxyCODONE-acetaminophen (PERCOCET/ROXICET) 5-325 MG tablet Take 1 tablet by mouth every 6 (six) hours as needed. (Patient not taking: Reported on 05/07/2018) 10 tablet 0  . oxyCODONE-acetaminophen (PERCOCET/ROXICET) 5-325 MG tablet Take 1 tablet by mouth every 6 (six) hours as needed for severe pain. (Patient not taking: Reported on 05/25/2018) 10 tablet 0   No facility-administered medications prior to visit.     No Known Allergies     Objective:    BP 114/72 (BP Location: Left Arm, Patient Position: Sitting, Cuff Size: Large) Comment (Cuff Size): thigh  Pulse (!) 110   Temp 99.1 F (37.3 C) (Oral)   Ht 5\' 6"  (1.676 m)   Wt (!) 364 lb (165.1 kg)   LMP 05/21/2018   SpO2 97%   BMI 58.75 kg/m  Wt Readings from Last 3 Encounters:  05/25/18 (!) 364 lb (165.1 kg)  05/07/18 (!) 354 lb (160.6 kg)  04/29/18 (!) 354 lb (160.6 kg)    Physical Exam  Constitutional: She is oriented to person, place, and time. She appears well-developed and well-nourished.  She is cooperative.  HENT:  Head: Normocephalic and atraumatic.  Eyes: EOM are normal.  Neck: Normal range of motion.  Cardiovascular: Regular rhythm, normal heart sounds and intact distal pulses. Tachycardia present. Exam reveals no gallop and no friction rub.  No murmur heard. Pulmonary/Chest: Effort normal and breath sounds normal. No tachypnea. No respiratory distress. She has no decreased breath sounds. She has no wheezes. She has no rhonchi. She has no rales. She exhibits no tenderness.  Abdominal: Soft. Bowel sounds are normal.  Musculoskeletal: Normal range of motion. She exhibits no edema.       Lumbar back: She exhibits pain. She exhibits normal range of motion, no swelling and no edema.  Neurological: She is alert and oriented to person, place, and time. Coordination normal.  Skin: Skin is warm and dry.  Psychiatric: She has a normal mood and affect. Her behavior is normal. Judgment and thought content normal.  Nursing note and vitals reviewed.      Patient has been counseled extensively about nutrition and exercise as well as the importance of adherence with medications and regular follow-up. The patient was given clear instructions to go to ER or return to medical center if symptoms don't improve, worsen or new problems develop. The patient verbalized understanding.   Follow-up: Return in about 3 weeks (around 06/15/2018) for new BP med.   Claiborne Rigg, FNP-BC [facility] [place], [place] [phone]   05/29/2018, 10:02 PM

## 2018-05-26 LAB — D-DIMER, QUANTITATIVE (NOT AT ARMC): D-DIMER: 0.86 mg{FEU}/L — AB (ref 0.00–0.49)

## 2018-05-26 LAB — TSH: TSH: 4.79 u[IU]/mL — ABNORMAL HIGH (ref 0.450–4.500)

## 2018-05-27 ENCOUNTER — Telehealth: Payer: Self-pay

## 2018-05-27 NOTE — Telephone Encounter (Signed)
-----   Message from Claiborne RiggZelda W Fleming, NP sent at 05/27/2018  1:58 AM EDT ----- Patient has elevate D dimer along with tachycardia and shortness of breath. Would like for her to go to the Emergency room to be evaluated. I have also sent a message through mychart.

## 2018-05-27 NOTE — Telephone Encounter (Signed)
CMA spoke to patient to inform on results.  Patient was inform per PCP to go to Emergency room to be evaluated.  Patient understood.

## 2018-05-29 ENCOUNTER — Encounter: Payer: Self-pay | Admitting: Nurse Practitioner

## 2018-06-09 ENCOUNTER — Ambulatory Visit: Payer: Self-pay | Admitting: Obstetrics & Gynecology

## 2018-06-10 ENCOUNTER — Encounter: Payer: Self-pay | Admitting: Obstetrics & Gynecology

## 2018-06-10 ENCOUNTER — Ambulatory Visit (INDEPENDENT_AMBULATORY_CARE_PROVIDER_SITE_OTHER): Payer: Self-pay | Admitting: Obstetrics & Gynecology

## 2018-06-10 VITALS — BP 153/103 | HR 103 | Ht 66.0 in | Wt 354.0 lb

## 2018-06-10 DIAGNOSIS — Z9889 Other specified postprocedural states: Secondary | ICD-10-CM

## 2018-06-10 NOTE — Progress Notes (Signed)
   Subjective:    Patient ID: Lori Mcknight, female    DOB: Oct 29, 1985, 32 y.o.   MRN: [id number]  HPI 32 yo P0 here for post op visit. She is not having any postop problems except for continued DUB. She had a diagnostic laposcopy and endometriosis was removed. Pathology confirmed endo. She also had a d&c for DUB. Pathology was negative. She would like a pregnancy.  She is not taking any meds for the DUB currently. She had been on megace for several months most recently. She has used it in the past on several occasions. She is aware that weight loss would help her bleeding profile. She is working on weight loss.   Review of Systems She has been trying to get pregnant for about a year. She has used clomid in the past for 3 cycles.    Objective:   Physical Exam        Assessment & Plan:  Desire for pregnancy- Taking folic acid, rec weight loss, rec Dr. April MansonYalcinkaya prn DUB- discussed treatment options but they would all inhibit pregnancy Endometriosis- same as above After discussion, she is willing to restart OCPs. However, her BP is too high for this. She is on meds for this. [facility] She is not willing to have an IUD. She will consider it and me know. Depo provera would be helpful for her pain and DUB but she does not want the associated increased appetite. She will go to the free pap clinic in 10/19

## 2018-06-11 MED FILL — METHOCARBAMOL 500 MG TABS: 500 | 15 days supply | Qty: 60 | Fill #0

## 2018-06-11 MED FILL — DULoxetine HCL 60 MG CPEP: 60 | 30 days supply | Qty: 30 | Fill #0

## 2018-06-23 ENCOUNTER — Ambulatory Visit: Payer: Self-pay | Admitting: Nurse Practitioner

## 2018-07-16 MED FILL — LEVOTHYROXINE 50 MCG TABLET: 50 | 30 days supply | Qty: 30 | Fill #1

## 2018-07-16 MED FILL — METHOCARBAMOL 500 MG TABS: 500 | 15 days supply | Qty: 60 | Fill #1

## 2018-07-27 ENCOUNTER — Ambulatory Visit: Payer: Self-pay | Admitting: Nurse Practitioner

## 2018-08-16 ENCOUNTER — Ambulatory Visit: Payer: Self-pay | Attending: Nurse Practitioner | Admitting: Nurse Practitioner

## 2018-08-16 ENCOUNTER — Encounter: Payer: Self-pay | Admitting: Nurse Practitioner

## 2018-08-16 VITALS — BP 124/82 | HR 95 | Temp 97.7°F | Ht 66.0 in | Wt 359.8 lb

## 2018-08-16 DIAGNOSIS — E039 Hypothyroidism, unspecified: Secondary | ICD-10-CM | POA: Insufficient documentation

## 2018-08-16 DIAGNOSIS — G8929 Other chronic pain: Secondary | ICD-10-CM

## 2018-08-16 DIAGNOSIS — Z09 Encounter for follow-up examination after completed treatment for conditions other than malignant neoplasm: Secondary | ICD-10-CM | POA: Insufficient documentation

## 2018-08-16 DIAGNOSIS — E282 Polycystic ovarian syndrome: Secondary | ICD-10-CM | POA: Insufficient documentation

## 2018-08-16 DIAGNOSIS — N760 Acute vaginitis: Secondary | ICD-10-CM

## 2018-08-16 DIAGNOSIS — Z791 Long term (current) use of non-steroidal anti-inflammatories (NSAID): Secondary | ICD-10-CM | POA: Insufficient documentation

## 2018-08-16 DIAGNOSIS — F329 Major depressive disorder, single episode, unspecified: Secondary | ICD-10-CM | POA: Insufficient documentation

## 2018-08-16 DIAGNOSIS — M5441 Lumbago with sciatica, right side: Secondary | ICD-10-CM

## 2018-08-16 DIAGNOSIS — I10 Essential (primary) hypertension: Secondary | ICD-10-CM

## 2018-08-16 DIAGNOSIS — B9689 Other specified bacterial agents as the cause of diseases classified elsewhere: Secondary | ICD-10-CM | POA: Insufficient documentation

## 2018-08-16 DIAGNOSIS — F419 Anxiety disorder, unspecified: Secondary | ICD-10-CM | POA: Insufficient documentation

## 2018-08-16 DIAGNOSIS — J45909 Unspecified asthma, uncomplicated: Secondary | ICD-10-CM | POA: Insufficient documentation

## 2018-08-16 DIAGNOSIS — M5442 Lumbago with sciatica, left side: Secondary | ICD-10-CM

## 2018-08-16 DIAGNOSIS — Z8249 Family history of ischemic heart disease and other diseases of the circulatory system: Secondary | ICD-10-CM | POA: Insufficient documentation

## 2018-08-16 DIAGNOSIS — Z79890 Hormone replacement therapy (postmenopausal): Secondary | ICD-10-CM | POA: Insufficient documentation

## 2018-08-16 DIAGNOSIS — Z79899 Other long term (current) drug therapy: Secondary | ICD-10-CM | POA: Insufficient documentation

## 2018-08-16 DIAGNOSIS — Z6841 Body Mass Index (BMI) 40.0 and over, adult: Secondary | ICD-10-CM

## 2018-08-16 LAB — POCT URINALYSIS DIP (CLINITEK)
Bilirubin, UA: NEGATIVE
GLUCOSE UA: NEGATIVE mg/dL
Ketones, POC UA: NEGATIVE mg/dL
Leukocytes, UA: NEGATIVE
NITRITE UA: NEGATIVE
PROTEIN: NEGATIVE
Spec Grav, UA: 1.01 (ref 1.010–1.025)
UROBILINOGEN UA: 0.2 U/dL
pH, UA: 5.5 (ref 5.0–8.0)

## 2018-08-16 MED ORDER — METOPROLOL TARTRATE 50 MG PO TABS: 50.0000 mg | ORAL_TABLET | Freq: Two times a day (BID) | ORAL | 2 refills | Status: DC

## 2018-08-16 MED ORDER — AMITRIPTYLINE HCL 50 MG PO TABS: 50.0000 mg | ORAL_TABLET | Freq: Every day | ORAL | 0 refills | Status: DC

## 2018-08-16 MED ORDER — GABAPENTIN 300 MG PO CAPS
300.0000 mg | ORAL_CAPSULE | Freq: Every day | ORAL | 1 refills | Status: DC
Start: 2018-08-16 — End: 2018-11-17

## 2018-08-16 NOTE — Progress Notes (Signed)
Assessment & Plan:  Lori was seen today for follow-up and vaginal discharge.  Diagnoses and all orders for this visit:  Essential hypertension -     metoprolol tartrate (LOPRESSOR) 50 MG tablet; Take 1 tablet (50 mg total) by mouth 2 (two) times daily. Continue all antihypertensives as prescribed.  Remember to bring in your blood pressure log with you for your follow up appointment.  DASH/Mediterranean Diets are healthier choices for HTN.    Acute vaginitis -     POCT URINALYSIS DIP (CLINITEK) -     Urine cytology ancillary only  Morbid obesity with BMI of 50.0-59.9, adult (HCC) -     TSH Discussed diet and exercise for person with BMI >57.  Instructed: You must burn more calories than you eat. Losing 5 percent of your body weight should be considered a success. In the longer term, losing more than 15 percent of your body weight and staying at this weight is an extremely good result. However, keep in mind that even losing 5 percent of your body weight leads to important health benefits, so try not to get discouraged if you're not able to lose more than this. Will recheck weight in 3 months.  Chronic midline low back pain with bilateral sciatica -     amitriptyline (ELAVIL) 50 MG tablet; Take 1 tablet (50 mg total) by mouth at bedtime. -     gabapentin (NEURONTIN) 300 MG capsule; Take 1 capsule (300 mg total) by mouth at bedtime. INSTRUCTIONS: Work on weight loss and stop smoking.  Work on losing weight to help reduce back pain. May alternate with heat and ice application for pain relief. May also alternate with acetaminophen and Ibuprofen as prescribed for back pain. Other alternatives include massage, acupuncture and water aerobics.  You must stay active and avoid a sedentary lifestyle.   Patient has been counseled on age-appropriate routine health concerns for screening and prevention. These are reviewed and up-to-date. Referrals have been placed accordingly. Immunizations are  up-to-date or declined.    Subjective:   Chief Complaint  Patient presents with  . Follow-up    Pt. is here to follow-up on new blood pressure medications. Pt. stated she is still taking Metorprolol 50mg  2x daily.   . Vaginal Discharge    Pt. stated she is having vaginal discharge and itching.    HPI Lori Mcknight 32 y.o. female presents to office today for f/u to HTN, Hypothyroidism and with complaints of vaginitis. She is skipping doses of her synthroid.   Vaginitis Patient complains of an abnormal vaginal discharge for several months. Vaginal symptoms include discharge described as malodorous and local irritation.Vulvar symptoms include local irritation.STI Risk: Possible STD exposure. Discharge described as: copious.Other associated symptoms: none.Menstrual pattern: She had been bleeding irregularly. Contraception: none   Hypertension Chronic and Well controlled. She does not monitor her blood pressure at home. She is up 5lbs with her weight. She was switched from metoprolol tartrate 50mg  BID to succinate 100mg  daily in August and was instructed to follow up in 3 weeks. This is the first time she is returning today from that visit. She continues to take metoprolol 50 mg BID. Denies chest pain, shortness of breath, palpitations, lightheadedness, dizziness, headaches or BLE edema.  BP Readings from Last 3 Encounters:  08/16/18 124/82  06/10/18 (!) 153/103  05/25/18 114/72    Review of Systems  Constitutional: Negative for fever, malaise/fatigue and weight loss.  HENT: Negative.  Negative for nosebleeds.   Eyes: Negative.  Negative for blurred vision, double vision and photophobia.  Respiratory: Negative.  Negative for cough and shortness of breath.   Cardiovascular: Negative.  Negative for chest pain, palpitations and leg swelling.  Gastrointestinal: Negative.  Negative for heartburn, nausea and vomiting.  Genitourinary:       SEE HPI  Musculoskeletal: Negative.  Negative for  myalgias.  Neurological: Negative.  Negative for dizziness, focal weakness, seizures and headaches.  Psychiatric/Behavioral: Positive for depression. Negative for suicidal ideas. The patient is nervous/anxious.     Past Medical History:  Diagnosis Date  . Anemia    history of anemia  . Anxiety   . Asthma    inhaler as needed  . Chest pain of uncertain etiology    intermittent left side chest pain  . Chlamydia   . Depression   . Genital herpes   . Headache    MIGRAINES  . Hypertension    no meds since April. needs new px per pt   . Hypothyroidism   . Morbid obesity (HCC)   . Obese   . Obesity, Class III, BMI 40-49.9 (morbid obesity) (HCC) 11/06/2011  . PCOS (polycystic ovarian syndrome)   . PONV (postoperative nausea and vomiting)   . Shortness of breath    on exertion from weight  . Thyroid disease   . Urinary tract infection     Past Surgical History:  Procedure Laterality Date  . DILATION AND CURETTAGE OF UTERUS    . DILATION AND CURETTAGE OF UTERUS N/A 04/29/2018   Procedure: DILATATION AND CURETTAGE;  Surgeon: Allie Bossier, MD;  Location: [facility];  Service: Gynecology;  Laterality: N/A;  . HYSTEROSCOPY W/D&C N/A 04/06/2014   Procedure: DILATATION AND CURETTAGE /HYSTEROSCOPY ;  Surgeon: Tereso Newcomer, MD;  Location: [facility];  Service: Gynecology;  Laterality: N/A;  . LAPAROSCOPY N/A 04/29/2018   Procedure: LAPAROSCOPY DIAGNOSTIC WITH PERITONEAL BIOPSY;  Surgeon: Allie Bossier, MD;  Location: [facility];  Service: Gynecology;  Laterality: N/A;  . LEFT HEART CATH AND CORONARY ANGIOGRAPHY N/A 12/12/2016   Procedure: Left Heart Cath and Coronary Angiography;  Surgeon: Peter M Swaziland, MD;  Location: [facility] INVASIVE CV LAB;  Service: Cardiovascular;  Laterality: N/A;  . NO PAST SURGERIES    . WISDOM TOOTH EXTRACTION      Family History  Problem Relation Age of Onset  . Heart disease Mother   . Sleep apnea Mother   . Diabetes Father   . Heart disease Father   . Heart disease  Maternal Grandmother   . Diabetes Maternal Grandfather   . Heart disease Maternal Grandfather     Social History Reviewed with no changes to be made today.   Outpatient Medications Prior to Visit  Medication Sig Dispense Refill  . acetaminophen (TYLENOL) 500 MG tablet Take 1,000 mg by mouth every 6 (six) hours as needed for mild pain.    Marland Kitchen albuterol (PROVENTIL HFA;VENTOLIN HFA) 108 (90 Base) MCG/ACT inhaler Inhale 2 puffs into the lungs every 6 (six) hours as needed for wheezing or shortness of breath. 1 Inhaler 2  . beclomethasone (QVAR) 40 MCG/ACT inhaler Inhale 1 puff into the lungs 2 (two) times daily as needed (for shortness of breath).     Marland Kitchen ibuprofen (ADVIL,MOTRIN) 600 MG tablet Take 1 tablet (600 mg total) by mouth every 6 (six) hours as needed. 30 tablet 1  . levothyroxine (SYNTHROID, LEVOTHROID) 50 MCG tablet Take 1 tablet (50 mcg total) by mouth daily. (Patient taking differently: Take 50 mcg by  mouth daily before breakfast. ) 90 tablet 0  . methocarbamol (ROBAXIN) 500 MG tablet Take 1 tablet (500 mg total) by mouth every 6 (six) hours as needed for muscle spasms. 60 tablet 2  . gabapentin (NEURONTIN) 300 MG capsule Take 1 capsule (300 mg total) by mouth at bedtime. 30 capsule 1  . DULoxetine (CYMBALTA) 60 MG capsule Take 1 capsule (60 mg total) by mouth daily. (Patient not taking: Reported on 06/10/2018) 30 capsule 3  . metoprolol succinate (TOPROL-XL) 100 MG 24 hr tablet Take 1 tablet (100 mg total) by mouth daily. Take with or immediately following a meal. (Patient not taking: Reported on 08/16/2018) 90 tablet 3   No facility-administered medications prior to visit.     No Known Allergies     Objective:    BP 124/82 (BP Location: Left Arm, Patient Position: Sitting, Cuff Size: Large) Comment (Cuff Size): thigh cuf  Pulse 95   Temp 97.7 F (36.5 C) (Oral)   Ht 5\' 6"  (1.676 m)   Wt (!) 359 lb 12.8 oz (163.2 kg)   LMP 07/07/2018   SpO2 99%   BMI 58.07 kg/m  Wt Readings  from Last 3 Encounters:  08/16/18 (!) 359 lb 12.8 oz (163.2 kg)  06/10/18 (!) 354 lb (160.6 kg)  05/25/18 (!) 364 lb (165.1 kg)    Physical Exam  Constitutional: She is oriented to person, place, and time. She appears well-developed and well-nourished. She is cooperative.  HENT:  Head: Normocephalic and atraumatic.  Eyes: EOM are normal.  Neck: Normal range of motion.  Cardiovascular: Normal rate, regular rhythm and normal heart sounds. Exam reveals no gallop and no friction rub.  No murmur heard. Pulmonary/Chest: Effort normal and breath sounds normal. No tachypnea. No respiratory distress. She has no decreased breath sounds. She has no wheezes. She has no rhonchi. She has no rales. She exhibits no tenderness.  Abdominal: Bowel sounds are normal.  Musculoskeletal: Normal range of motion. She exhibits no edema.       Lumbar back: She exhibits pain.  Neurological: She is alert and oriented to person, place, and time. Coordination normal.  Skin: Skin is warm and dry.  Psychiatric: She has a normal mood and affect. Her behavior is normal. Judgment and thought content normal.  Nursing note and vitals reviewed.        Patient has been counseled extensively about nutrition and exercise as well as the importance of adherence with medications and regular follow-up. The patient was given clear instructions to go to ER or return to medical center if symptoms don't improve, worsen or new problems develop. The patient verbalized understanding.   Follow-up: Return in about 4 months (around 12/15/2018) for BP recheck.   Claiborne Rigg, FNP-BC [facility] [place], [place] [phone]   08/17/2018, 6:43 PM

## 2018-08-17 ENCOUNTER — Encounter: Payer: Self-pay | Admitting: Nurse Practitioner

## 2018-08-17 LAB — URINE CYTOLOGY ANCILLARY ONLY
CHLAMYDIA, DNA PROBE: NEGATIVE
NEISSERIA GONORRHEA: NEGATIVE
TRICH (WINDOWPATH): NEGATIVE

## 2018-08-17 LAB — TSH: TSH: 13.8 u[IU]/mL — ABNORMAL HIGH (ref 0.450–4.500)

## 2018-08-17 MED ORDER — LEVOTHYROXINE SODIUM 75 MCG PO TABS: 75.0000 ug | ORAL_TABLET | Freq: Every day | ORAL | 2 refills | Status: DC

## 2018-08-17 MED ORDER — LEVOTHYROXINE SODIUM 88 MCG PO TABS: 88.0000 ug | ORAL_TABLET | Freq: Every day | ORAL | 2 refills | Status: DC

## 2018-08-18 ENCOUNTER — Other Ambulatory Visit: Payer: Self-pay | Admitting: Nurse Practitioner

## 2018-08-18 LAB — URINE CYTOLOGY ANCILLARY ONLY
BACTERIAL VAGINITIS: POSITIVE — AB
Candida vaginitis: NEGATIVE

## 2018-08-18 MED ORDER — METRONIDAZOLE 500 MG PO TABS: 500.0000 mg | ORAL_TABLET | Freq: Two times a day (BID) | ORAL | 0 refills | Status: AC

## 2018-08-19 MED FILL — metroNIDAZOLE 500 MG TABS: 500 | 7 days supply | Qty: 14 | Fill #0

## 2018-08-20 ENCOUNTER — Ambulatory Visit (HOSPITAL_COMMUNITY)
Admission: EM | Admit: 2018-08-20 | Discharge: 2018-08-20 | Disposition: A | Discharge status: Home / Self Care | Payer: Self-pay | Attending: Internal Medicine | Admitting: Internal Medicine

## 2018-08-20 ENCOUNTER — Encounter (HOSPITAL_COMMUNITY): Payer: Self-pay | Admitting: Emergency Medicine

## 2018-08-20 DIAGNOSIS — G8929 Other chronic pain: Secondary | ICD-10-CM

## 2018-08-20 DIAGNOSIS — M25511 Pain in right shoulder: Secondary | ICD-10-CM

## 2018-08-20 MED ORDER — KETOROLAC TROMETHAMINE 30 MG/ML IJ SOLN
INTRAMUSCULAR | Status: AC
  Filled 2018-08-20: qty 1

## 2018-08-20 MED ORDER — KETOROLAC TROMETHAMINE 60 MG/2ML IM SOLN
60.0000 mg | Freq: Once | INTRAMUSCULAR | Status: AC
  Administered 2018-08-20: 60 mg via INTRAMUSCULAR

## 2018-08-20 MED ORDER — NAPROXEN 500 MG PO TABS: 500.0000 mg | ORAL_TABLET | Freq: Two times a day (BID) | ORAL | 0 refills | Status: DC

## 2018-08-20 MED ORDER — KETOROLAC TROMETHAMINE 60 MG/2ML IM SOLN
INTRAMUSCULAR | Status: AC
  Filled 2018-08-20: qty 2

## 2018-08-20 NOTE — Discharge Instructions (Addendum)
An injection of ketorolac (anti inflammatory/pain reliever) was given at the urgent care today for pain in right neck/shoulder.  No danger signs on exam today.  A page of shoulder exercises was given, to try to help with pain/stiffness.  These exercises may make the shoulder a little bit more sore.  Movement of the shoulder should improve and pain should decrease over a few weeks time.  Prescription for naproxen (anti inflammatory/pain reliever) was sent to the pharmacy.  Use heat for a few minutes before doing shoulder exercises, to warm it up.  After exercises, put ice for 5-10 minutes, and several times daily as needed to help decrease pain/stiffness.  Followup with a primary care provider or sports medicine provider if not improving over the next couple weeks, to discuss possible next steps.

## 2018-08-20 NOTE — ED Triage Notes (Signed)
Pt here for right shoulder and neck pain x months

## 2018-08-20 NOTE — ED Provider Notes (Signed)
[facility]    CSN: [id number] Arrival date & time: 08/20/18  1324     History   Chief Complaint Chief Complaint  Patient presents with  . Shoulder Pain    HPI Lori Mcknight is a 32 y.o. female.   She presents today with a several months history of pain in the right shoulder and neck, particularly with movement but sometimes at rest.  No injury or unusual activity prior to onset of pain.  Pain has gradually increased over time.  Not able to tolerate sleeping on the right side due to shoulder pain.    HPI  Past Medical History:  Diagnosis Date  . Anemia    history of anemia  . Anxiety   . Asthma    inhaler as needed  . Chest pain of uncertain etiology    intermittent left side chest pain  . Chlamydia   . Depression   . Genital herpes   . Headache    MIGRAINES  . Hypertension    no meds since April. needs new px per pt   . Hypothyroidism   . Morbid obesity (HCC)   . Obese   . Obesity, Class III, BMI 40-49.9 (morbid obesity) (HCC) 11/06/2011  . PCOS (polycystic ovarian syndrome)   . PONV (postoperative nausea and vomiting)   . Shortness of breath    on exertion from weight  . Thyroid disease   . Urinary tract infection     Patient Active Problem List   Diagnosis Date Noted  . Incomplete bladder emptying 02/18/2018  . DUB (dysfunctional uterine bleeding) 02/18/2018  . Anemia 02/18/2018  . Hypothyroid 01/29/2017  . Migraines 08/26/2016  . Chest pain 02/04/2016  . Abnormal uterine bleeding (AUB) 01/09/2014  . Thickened endometrium 12/02/2013  . Essential hypertension 10/21/2013  . PCOS (polycystic ovarian syndrome) 11/06/2011  . Infertility, female 11/06/2011  . Pelvic pain in female 08/01/2011    Past Surgical History:  Procedure Laterality Date  . DILATION AND CURETTAGE OF UTERUS    . DILATION AND CURETTAGE OF UTERUS N/A 04/29/2018   Procedure: DILATATION AND CURETTAGE;  Surgeon: Allie Bossier, MD;  Location: [facility];  Service: Gynecology;   Laterality: N/A;  . HYSTEROSCOPY W/D&C N/A 04/06/2014   Procedure: DILATATION AND CURETTAGE /HYSTEROSCOPY ;  Surgeon: Tereso Newcomer, MD;  Location: [facility];  Service: Gynecology;  Laterality: N/A;  . LAPAROSCOPY N/A 04/29/2018   Procedure: LAPAROSCOPY DIAGNOSTIC WITH PERITONEAL BIOPSY;  Surgeon: Allie Bossier, MD;  Location: [facility];  Service: Gynecology;  Laterality: N/A;  . LEFT HEART CATH AND CORONARY ANGIOGRAPHY N/A 12/12/2016   Procedure: Left Heart Cath and Coronary Angiography;  Surgeon: Peter M Swaziland, MD;  Location: [facility] INVASIVE CV LAB;  Service: Cardiovascular;  Laterality: N/A;  . NO PAST SURGERIES    . WISDOM TOOTH EXTRACTION      OB History    Gravida  0   Para  0   Term  0   Preterm  0   AB  0   Living  0     SAB  0   TAB  0   Ectopic  0   Multiple  0   Live Births  0            Home Medications    Prior to Admission medications   Medication Sig Start Date End Date Taking? Authorizing Provider  acetaminophen (TYLENOL) 500 MG tablet Take 1,000 mg by mouth every 6 (six) hours as  needed for mild pain.    [provider]  albuterol (PROVENTIL HFA;VENTOLIN HFA) 108 (90 Base) MCG/ACT inhaler Inhale 2 puffs into the lungs every 6 (six) hours as needed for wheezing or shortness of breath. 01/19/18   Claiborne Rigg, NP  amitriptyline (ELAVIL) 50 MG tablet Take 1 tablet (50 mg total) by mouth at bedtime. 08/16/18   Claiborne Rigg, NP  beclomethasone (QVAR) 40 MCG/ACT inhaler Inhale 1 puff into the lungs 2 (two) times daily as needed (for shortness of breath).     [provider]  gabapentin (NEURONTIN) 300 MG capsule Take 1 capsule (300 mg total) by mouth at bedtime. 08/16/18   Claiborne Rigg, NP  ibuprofen (ADVIL,MOTRIN) 600 MG tablet Take 1 tablet (600 mg total) by mouth every 6 (six) hours as needed. 04/29/18   Allie Bossier, MD  levothyroxine (SYNTHROID, LEVOTHROID) 75 MCG tablet Take 1 tablet (75 mcg total) by mouth daily before breakfast.  08/17/18 09/16/18  Claiborne Rigg, NP  methocarbamol (ROBAXIN) 500 MG tablet Take 1 tablet (500 mg total) by mouth every 6 (six) hours as needed for muscle spasms. 05/25/18   Claiborne Rigg, NP  metoprolol tartrate (LOPRESSOR) 50 MG tablet Take 1 tablet (50 mg total) by mouth 2 (two) times daily. 08/16/18 11/14/18  Claiborne Rigg, NP  metroNIDAZOLE (FLAGYL) 500 MG tablet Take 1 tablet (500 mg total) by mouth 2 (two) times daily for 7 days. 08/18/18 08/25/18  Claiborne Rigg, NP  naproxen (NAPROSYN) 500 MG tablet Take 1 tablet (500 mg total) by mouth 2 (two) times daily. 08/20/18   Isa Rankin, MD    Family History Family History  Problem Relation Age of Onset  . Heart disease Mother   . Sleep apnea Mother   . Diabetes Father   . Heart disease Father   . Heart disease Maternal Grandmother   . Diabetes Maternal Grandfather   . Heart disease Maternal Grandfather     Social History Social History   Tobacco Use  . Smoking status: Light Tobacco Smoker  . Smokeless tobacco: Never Used  . Tobacco comment: 2-3 cigerettes a week.  Substance Use Topics  . Alcohol use: Yes    Alcohol/week: 0.0 standard drinks    Comment: occasonal   . Drug use: Not Currently    Types: Marijuana     Allergies   Patient has no known allergies.   Review of Systems Review of Systems  All other systems reviewed and are negative.    Physical Exam Triage Vital Signs ED Triage Vitals  Enc Vitals Group     BP 08/20/18 1408 (!) 144/88     Pulse Rate 08/20/18 1408 98     Resp 08/20/18 1408 18     Temp 08/20/18 1408 98.1 F (36.7 C)     Temp Source 08/20/18 1408 Oral     SpO2 08/20/18 1408 99 %     Weight --      Height --      Pain Score 08/20/18 1409 8     Pain Loc --    Updated Vital Signs BP (!) 144/88 (BP Location: Left Arm)   Pulse 98   Temp 98.1 F (36.7 C) (Oral)   Resp 18   SpO2 99%  Physical Exam  Constitutional: She is oriented to person, place, and time. No distress.    Alert, nicely groomed  HENT:  Head: Atraumatic.  Eyes:  Conjugate gaze, no eye redness/drainage  Neck:  Neck supple.  Cardiovascular: Normal rate.  Pulmonary/Chest: No respiratory distress.  Lungs clear, symmetric breath sounds  Abdominal: She exhibits no distension.  Musculoskeletal: Normal range of motion.  No leg swelling Marked pain in the right shoulder with attempts to elevate above 90 degrees, and painful to elevate up to 90 degrees.  No tenderness to palpation of the [facility] joint. No change in shoulder pain with neck rotation or flexion/extension, right shoulder pain is worse with left lateral neck flexion.  Neurological: She is alert and oriented to person, place, and time.  Skin: Skin is warm and dry.  No cyanosis  Nursing note and vitals reviewed.    UC Treatments / Results   Procedures Procedures (including critical care time)  Medications Ordered in UC Medications  ketorolac (TORADOL) injection 60 mg (60 mg Intramuscular Given 08/20/18 1511)     Final Clinical Impressions(s) / UC Diagnoses   Final diagnoses:  Chronic right shoulder pain     Discharge Instructions     An injection of ketorolac (anti inflammatory/pain reliever) was given at the urgent care today for pain in right neck/shoulder.  No danger signs on exam today.  A page of shoulder exercises was given, to try to help with pain/stiffness.  These exercises may make the shoulder a little bit more sore.  Movement of the shoulder should improve and pain should decrease over a few weeks time.  Prescription for naproxen (anti inflammatory/pain reliever) was sent to the pharmacy.  Use heat for a few minutes before doing shoulder exercises, to warm it up.  After exercises, put ice for 5-10 minutes, and several times daily as needed to help decrease pain/stiffness.  Followup with a primary care provider or sports medicine provider if not improving over the next couple weeks, to discuss possible next steps.       ED Prescriptions    Medication Sig Dispense Auth. Provider   naproxen (NAPROSYN) 500 MG tablet Take 1 tablet (500 mg total) by mouth 2 (two) times daily. 30 tablet Isa Rankin, MD        Isa Rankin, MD 08/22/18 [phone]

## 2018-08-28 ENCOUNTER — Encounter: Payer: Self-pay | Admitting: Nurse Practitioner

## 2018-08-30 NOTE — Telephone Encounter (Signed)
[facility] [employer] [facility]

## 2018-08-31 ENCOUNTER — Telehealth: Payer: Self-pay | Admitting: Nurse Practitioner

## 2018-08-31 NOTE — Telephone Encounter (Signed)
Will route to PCP 

## 2018-08-31 NOTE — Telephone Encounter (Signed)
Patient called to get a medication prescribed for a yeast infection. Patient says she also sent a message through Mychart.  Please follow up.

## 2018-09-01 ENCOUNTER — Other Ambulatory Visit: Payer: Self-pay | Admitting: Nurse Practitioner

## 2018-09-01 MED ORDER — FLUCONAZOLE 150 MG PO TABS: 150.0000 mg | ORAL_TABLET | Freq: Once | ORAL | 0 refills | Status: AC

## 2018-09-01 NOTE — Telephone Encounter (Signed)
Script has been sent. She will have to come in to be tested for yeast for any additional prescriptions

## 2018-09-02 NOTE — Telephone Encounter (Signed)
CMA spoke to patient to inform on Rx.  Pt. Verified DOB. Pt. Understood and aware of PCP advising.

## 2018-09-20 ENCOUNTER — Ambulatory Visit: Payer: Self-pay | Admitting: Obstetrics & Gynecology

## 2018-09-24 ENCOUNTER — Emergency Department (HOSPITAL_COMMUNITY): Payer: Self-pay

## 2018-09-24 ENCOUNTER — Emergency Department (HOSPITAL_COMMUNITY)
Admission: EM | Admit: 2018-09-24 | Discharge: 2018-09-24 | Disposition: A | Discharge status: Home / Self Care | Payer: Self-pay | Attending: Emergency Medicine | Admitting: Emergency Medicine

## 2018-09-24 ENCOUNTER — Encounter (HOSPITAL_COMMUNITY): Payer: Self-pay

## 2018-09-24 DIAGNOSIS — E039 Hypothyroidism, unspecified: Secondary | ICD-10-CM | POA: Insufficient documentation

## 2018-09-24 DIAGNOSIS — K5792 Diverticulitis of intestine, part unspecified, without perforation or abscess without bleeding: Secondary | ICD-10-CM | POA: Insufficient documentation

## 2018-09-24 DIAGNOSIS — Z79899 Other long term (current) drug therapy: Secondary | ICD-10-CM | POA: Insufficient documentation

## 2018-09-24 DIAGNOSIS — F17200 Nicotine dependence, unspecified, uncomplicated: Secondary | ICD-10-CM | POA: Insufficient documentation

## 2018-09-24 DIAGNOSIS — I10 Essential (primary) hypertension: Secondary | ICD-10-CM | POA: Insufficient documentation

## 2018-09-24 DIAGNOSIS — J45909 Unspecified asthma, uncomplicated: Secondary | ICD-10-CM | POA: Insufficient documentation

## 2018-09-24 LAB — CBC
HCT: 34.7 % — ABNORMAL LOW (ref 36.0–46.0)
Hemoglobin: 10.6 g/dL — ABNORMAL LOW (ref 12.0–15.0)
MCH: 26.8 pg (ref 26.0–34.0)
MCHC: 30.5 g/dL (ref 30.0–36.0)
MCV: 87.6 fL (ref 80.0–100.0)
Platelets: 230 10*3/uL (ref 150–400)
RBC: 3.96 MIL/uL (ref 3.87–5.11)
RDW: 17.3 % — ABNORMAL HIGH (ref 11.5–15.5)
WBC: 7.6 10*3/uL (ref 4.0–10.5)
nRBC: 0 % (ref 0.0–0.2)

## 2018-09-24 LAB — COMPREHENSIVE METABOLIC PANEL
ALT: 19 U/L (ref 0–44)
AST: 22 U/L (ref 15–41)
Albumin: 3.9 g/dL (ref 3.5–5.0)
Alkaline Phosphatase: 77 U/L (ref 38–126)
Anion gap: 9 (ref 5–15)
BUN: 7 mg/dL (ref 6–20)
CO2: 23 mmol/L (ref 22–32)
Calcium: 8.6 mg/dL — ABNORMAL LOW (ref 8.9–10.3)
Chloride: 108 mmol/L (ref 98–111)
Creatinine, Ser: 0.71 mg/dL (ref 0.44–1.00)
GFR calc Af Amer: >60 mL/min (ref >60)
GFR calc non Af Amer: >60 mL/min (ref >60)
Glucose, Bld: 110 mg/dL — ABNORMAL HIGH (ref 70–99)
POTASSIUM: 2.9 mmol/L — AB (ref 3.5–5.1)
SODIUM: 140 mmol/L (ref 135–145)
Total Bilirubin: 0.5 mg/dL (ref 0.3–1.2)
Total Protein: 7.6 g/dL (ref 6.5–8.1)

## 2018-09-24 LAB — I-STAT BETA HCG BLOOD, ED (MC, WL, AP ONLY): I-stat hCG, quantitative: <5 m[IU]/mL (ref <5)

## 2018-09-24 LAB — URINALYSIS, ROUTINE W REFLEX MICROSCOPIC
Bilirubin Urine: NEGATIVE
Glucose, UA: NEGATIVE mg/dL
Hgb urine dipstick: NEGATIVE
Ketones, ur: NEGATIVE mg/dL
LEUKOCYTES UA: NEGATIVE
Nitrite: NEGATIVE
Protein, ur: NEGATIVE mg/dL
Specific Gravity, Urine: 1.016 (ref 1.005–1.030)
pH: 7 (ref 5.0–8.0)

## 2018-09-24 LAB — LIPASE, BLOOD: Lipase: 25 U/L (ref 11–51)

## 2018-09-24 MED ORDER — METRONIDAZOLE 500 MG PO TABS: 500.0000 mg | ORAL_TABLET | Freq: Two times a day (BID) | ORAL | 0 refills | Status: DC

## 2018-09-24 MED ORDER — CIPROFLOXACIN HCL 500 MG PO TABS: 500.0000 mg | ORAL_TABLET | Freq: Two times a day (BID) | ORAL | 0 refills | Status: DC

## 2018-09-24 MED ORDER — OXYCODONE-ACETAMINOPHEN 5-325 MG PO TABS: 2.0000 | ORAL_TABLET | ORAL | 0 refills | Status: DC | PRN

## 2018-09-24 MED ORDER — OXYCODONE-ACETAMINOPHEN 5-325 MG PO TABS
1.0000 | ORAL_TABLET | Freq: Once | ORAL | Status: AC
  Administered 2018-09-24: 1 via ORAL
  Filled 2018-09-24: qty 1

## 2018-09-24 MED ORDER — IOPAMIDOL (ISOVUE-300) INJECTION 61%
INTRAVENOUS | Status: AC
  Filled 2018-09-24: qty 100

## 2018-09-24 MED ORDER — CIPROFLOXACIN HCL 500 MG PO TABS
500.0000 mg | ORAL_TABLET | Freq: Once | ORAL | Status: AC
  Administered 2018-09-24: 500 mg via ORAL
  Filled 2018-09-24: qty 1

## 2018-09-24 MED ORDER — DIPHENHYDRAMINE HCL 50 MG/ML IJ SOLN
12.5000 mg | Freq: Once | INTRAMUSCULAR | Status: AC
  Administered 2018-09-24: 12.5 mg via INTRAVENOUS
  Filled 2018-09-24: qty 1

## 2018-09-24 MED ORDER — METRONIDAZOLE 500 MG PO TABS
500.0000 mg | ORAL_TABLET | Freq: Once | ORAL | Status: AC
  Administered 2018-09-24: 500 mg via ORAL
  Filled 2018-09-24: qty 1

## 2018-09-24 MED ORDER — SODIUM CHLORIDE (PF) 0.9 % IJ SOLN
INTRAMUSCULAR | Status: AC
  Filled 2018-09-24: qty 50

## 2018-09-24 MED ORDER — IOPAMIDOL (ISOVUE-300) INJECTION 61%
100.0000 mL | Freq: Once | INTRAVENOUS | Status: AC | PRN
  Administered 2018-09-24: 100 mL via INTRAVENOUS

## 2018-09-24 MED ORDER — METOCLOPRAMIDE HCL 5 MG/ML IJ SOLN
5.0000 mg | Freq: Once | INTRAMUSCULAR | Status: AC
  Administered 2018-09-24: 5 mg via INTRAVENOUS
  Filled 2018-09-24: qty 2

## 2018-09-24 MED ORDER — MORPHINE SULFATE (PF) 4 MG/ML IV SOLN
6.0000 mg | Freq: Once | INTRAVENOUS | Status: AC
  Administered 2018-09-24: 6 mg via INTRAVENOUS
  Filled 2018-09-24: qty 2

## 2018-09-24 MED ORDER — POTASSIUM CHLORIDE CRYS ER 20 MEQ PO TBCR
40.0000 meq | EXTENDED_RELEASE_TABLET | Freq: Once | ORAL | Status: AC
  Administered 2018-09-24: 40 meq via ORAL
  Filled 2018-09-24: qty 2

## 2018-09-24 MED ORDER — IBUPROFEN 200 MG PO TABS
400.0000 mg | ORAL_TABLET | Freq: Once | ORAL | Status: AC
  Administered 2018-09-24: 400 mg via ORAL
  Filled 2018-09-24: qty 2

## 2018-09-24 NOTE — ED Notes (Signed)
Patient attempted to void and was unsuccessful. RN made aware.

## 2018-09-24 NOTE — ED Notes (Signed)
Pt verbalized discharge instructions and follow up care. Pt's brother is driving her home. Pt ambulatory and A&Ox4

## 2018-09-24 NOTE — ED Triage Notes (Signed)
Pt presents with c/o abdominal pain and blood in her stool since Sunday. Pt also reporting some burning and pain with urination. Pt reports pain in her pelvis with ambulation.

## 2018-09-24 NOTE — ED Notes (Signed)
Patient transported to CT 

## 2018-09-24 NOTE — ED Provider Notes (Signed)
[facility]-EMERGENCY DEPT Provider Note   CSN: [id number] Arrival date & time: 09/24/18  1416     History   Chief Complaint Chief Complaint  Patient presents with  . Abdominal Pain  . Rectal Bleeding    HPI Lori Mcknight is a 32 y.o. female.  This is a 32 year old female who presents with several days of suprapubic pain with urinary urgency and frequency.  She denies any fever or emesis.  No new back pain.  Denies any vaginal bleeding or discharge.  Of note she did have some mucousy stools which had a streak of blood in it but denies any bright red or dark red blood per rectum.  Is concerned that she may have a urinary tract infection.     Past Medical History:  Diagnosis Date  . Anemia    history of anemia  . Anxiety   . Asthma    inhaler as needed  . Chest pain of uncertain etiology    intermittent left side chest pain  . Chlamydia   . Depression   . Genital herpes   . Headache    MIGRAINES  . Hypertension    no meds since April. needs new px per pt   . Hypothyroidism   . Morbid obesity (HCC)   . Obese   . Obesity, Class III, BMI 40-49.9 (morbid obesity) (HCC) 11/06/2011  . PCOS (polycystic ovarian syndrome)   . PONV (postoperative nausea and vomiting)   . Shortness of breath    on exertion from weight  . Thyroid disease   . Urinary tract infection     Patient Active Problem List   Diagnosis Date Noted  . Incomplete bladder emptying 02/18/2018  . DUB (dysfunctional uterine bleeding) 02/18/2018  . Anemia 02/18/2018  . Hypothyroid 01/29/2017  . Migraines 08/26/2016  . Chest pain 02/04/2016  . Abnormal uterine bleeding (AUB) 01/09/2014  . Thickened endometrium 12/02/2013  . Essential hypertension 10/21/2013  . PCOS (polycystic ovarian syndrome) 11/06/2011  . Infertility, female 11/06/2011  . Pelvic pain in female 08/01/2011    Past Surgical History:  Procedure Laterality Date  . DILATION AND CURETTAGE OF UTERUS    . DILATION  AND CURETTAGE OF UTERUS N/A 04/29/2018   Procedure: DILATATION AND CURETTAGE;  Surgeon: Allie Bossier, MD;  Location: [facility];  Service: Gynecology;  Laterality: N/A;  . HYSTEROSCOPY W/D&C N/A 04/06/2014   Procedure: DILATATION AND CURETTAGE /HYSTEROSCOPY ;  Surgeon: Tereso Newcomer, MD;  Location: [facility];  Service: Gynecology;  Laterality: N/A;  . LAPAROSCOPY N/A 04/29/2018   Procedure: LAPAROSCOPY DIAGNOSTIC WITH PERITONEAL BIOPSY;  Surgeon: Allie Bossier, MD;  Location: [facility];  Service: Gynecology;  Laterality: N/A;  . LEFT HEART CATH AND CORONARY ANGIOGRAPHY N/A 12/12/2016   Procedure: Left Heart Cath and Coronary Angiography;  Surgeon: Peter M Swaziland, MD;  Location: [facility] INVASIVE CV LAB;  Service: Cardiovascular;  Laterality: N/A;  . NO PAST SURGERIES    . WISDOM TOOTH EXTRACTION       OB History    Gravida  0   Para  0   Term  0   Preterm  0   AB  0   Living  0     SAB  0   TAB  0   Ectopic  0   Multiple  0   Live Births  0            Home Medications    Prior to Admission medications  Medication Sig Start Date End Date Taking? Authorizing Provider  acetaminophen (TYLENOL) 500 MG tablet Take 1,000 mg by mouth every 6 (six) hours as needed for mild pain.    [provider]  albuterol (PROVENTIL HFA;VENTOLIN HFA) 108 (90 Base) MCG/ACT inhaler Inhale 2 puffs into the lungs every 6 (six) hours as needed for wheezing or shortness of breath. 01/19/18   Claiborne RiggFleming, Zelda W, NP  amitriptyline (ELAVIL) 50 MG tablet Take 1 tablet (50 mg total) by mouth at bedtime. 08/16/18   Claiborne RiggFleming, Zelda W, NP  beclomethasone (QVAR) 40 MCG/ACT inhaler Inhale 1 puff into the lungs 2 (two) times daily as needed (for shortness of breath).     [provider]  gabapentin (NEURONTIN) 300 MG capsule Take 1 capsule (300 mg total) by mouth at bedtime. 08/16/18   Claiborne RiggFleming, Zelda W, NP  ibuprofen (ADVIL,MOTRIN) 600 MG tablet Take 1 tablet (600 mg total) by mouth every 6 (six) hours as  needed. 04/29/18   Allie Bossierove, Myra C, MD  levothyroxine (SYNTHROID, LEVOTHROID) 75 MCG tablet Take 1 tablet (75 mcg total) by mouth daily before breakfast. 08/17/18 09/16/18  Claiborne RiggFleming, Zelda W, NP  methocarbamol (ROBAXIN) 500 MG tablet Take 1 tablet (500 mg total) by mouth every 6 (six) hours as needed for muscle spasms. 05/25/18   Claiborne RiggFleming, Zelda W, NP  metoprolol tartrate (LOPRESSOR) 50 MG tablet Take 1 tablet (50 mg total) by mouth 2 (two) times daily. 08/16/18 11/14/18  Claiborne RiggFleming, Zelda W, NP  naproxen (NAPROSYN) 500 MG tablet Take 1 tablet (500 mg total) by mouth 2 (two) times daily. 08/20/18   Isa RankinMurray, Laura Wilson, MD    Family History Family History  Problem Relation Age of Onset  . Heart disease Mother   . Sleep apnea Mother   . Diabetes Father   . Heart disease Father   . Heart disease Maternal Grandmother   . Diabetes Maternal Grandfather   . Heart disease Maternal Grandfather     Social History Social History   Tobacco Use  . Smoking status: Light Tobacco Smoker  . Smokeless tobacco: Never Used  . Tobacco comment: 2-3 cigerettes a week.  Substance Use Topics  . Alcohol use: Yes    Alcohol/week: 0.0 standard drinks    Comment: occasonal   . Drug use: Not Currently    Types: Marijuana     Allergies   Patient has no known allergies.   Review of Systems Review of Systems  All other systems reviewed and are negative.    Physical Exam Updated Vital Signs BP (!) 144/114 (BP Location: Right Arm)   Pulse (!) 120   Temp 98.2 F (36.8 C) (Oral)   Resp 18   LMP 07/07/2018 (Approximate)   SpO2 96%   Physical Exam Vitals signs and nursing note reviewed.  Constitutional:      General: She is not in acute distress.    Appearance: Normal appearance. She is well-developed. She is not toxic-appearing.  HENT:     Head: Normocephalic and atraumatic.  Eyes:     General: Lids are normal.     Conjunctiva/sclera: Conjunctivae normal.     Pupils: Pupils are equal, round, and reactive  to light.  Neck:     Musculoskeletal: Normal range of motion and neck supple.     Thyroid: No thyroid mass.     Trachea: No tracheal deviation.  Cardiovascular:     Rate and Rhythm: Normal rate and regular rhythm.     Heart sounds: Normal heart sounds.  No murmur. No gallop.   Pulmonary:     Effort: Pulmonary effort is normal. No respiratory distress.     Breath sounds: Normal breath sounds. No stridor. No decreased breath sounds, wheezing, rhonchi or rales.  Abdominal:     General: Bowel sounds are normal. There is no distension.     Palpations: Abdomen is soft.     Tenderness: There is abdominal tenderness in the suprapubic area. There is no rebound.    Musculoskeletal: Normal range of motion.        General: No tenderness.  Skin:    General: Skin is warm and dry.     Findings: No abrasion or rash.  Neurological:     Mental Status: She is alert and oriented to person, place, and time.     GCS: GCS eye subscore is 4. GCS verbal subscore is 5. GCS motor subscore is 6.     Cranial Nerves: No cranial nerve deficit.     Sensory: No sensory deficit.  Psychiatric:        Speech: Speech normal.        Behavior: Behavior normal.      ED Treatments / Results  Labs (all labs ordered are listed, but only abnormal results are displayed) Labs Reviewed  COMPREHENSIVE METABOLIC PANEL - Abnormal; Notable for the following components:      Result Value   Potassium 2.9 (*)    Glucose, Bld 110 (*)    Calcium 8.6 (*)    All other components within normal limits  CBC - Abnormal; Notable for the following components:   Hemoglobin 10.6 (*)    HCT 34.7 (*)    RDW 17.3 (*)    All other components within normal limits  LIPASE, BLOOD  URINALYSIS, ROUTINE W REFLEX MICROSCOPIC  I-STAT BETA HCG BLOOD, ED ([facility], [facility], [facility] ONLY)    EKG None  Radiology No results found.  Procedures Procedures (including critical care time)  Medications Ordered in ED Medications  oxyCODONE-acetaminophen  (PERCOCET/ROXICET) 5-325 MG per tablet 1 tablet (has no administration in time range)     Initial Impression / Assessment and Plan / ED Course  I have reviewed the triage vital signs and the nursing notes.  Pertinent labs & imaging results that were available during my care of the patient were reviewed by me and considered in my medical decision making (see chart for details).     Urinalysis negative for infection.  She continues to deny any vaginal bleeding or discharge.  Mild hypokalemia noted treated with oral potassium.  Patient medicated for pain and does feel slightly better.  Abdominal CT ordered and shows diverticulitis.  Patient given oral dose of Cipro and Flagyl here.  Will prescribe same as well as short course of opiates and patient to follow-up with her primary care doctor Final Clinical Impressions(s) / ED Diagnoses   Final diagnoses:  None    ED Discharge Orders    None       Lorre Nick, MD 09/24/18 1946

## 2018-09-27 MED FILL — CIPROFLOXACIN HCL 500 MG TA: 500 | 7 days supply | Qty: 14 | Fill #0

## 2018-09-27 MED FILL — metroNIDAZOLE 500 MG TABS: 500 | 7 days supply | Qty: 14 | Fill #0

## 2018-11-04 ENCOUNTER — Other Ambulatory Visit: Payer: Self-pay | Admitting: Nurse Practitioner

## 2018-11-04 NOTE — Telephone Encounter (Signed)
Refill request

## 2018-11-05 MED ORDER — LEVOTHYROXINE SODIUM 75 MCG PO TABS: 75.0000 ug | ORAL_TABLET | Freq: Every day | ORAL | 0 refills | Status: DC

## 2018-11-08 MED FILL — LEVOTHYROXINE 75 MCG TABLET: 75 | 30 days supply | Qty: 30 | Fill #0

## 2018-11-17 ENCOUNTER — Encounter (HOSPITAL_COMMUNITY): Payer: Self-pay | Admitting: *Deleted

## 2018-11-17 ENCOUNTER — Inpatient Hospital Stay (HOSPITAL_COMMUNITY)
Admission: AD | Admit: 2018-11-17 | Discharge: 2018-11-17 | Disposition: A | Discharge status: Home / Self Care | Payer: Self-pay | Attending: Obstetrics and Gynecology | Admitting: Obstetrics and Gynecology

## 2018-11-17 ENCOUNTER — Other Ambulatory Visit: Payer: Self-pay

## 2018-11-17 DIAGNOSIS — Z3202 Encounter for pregnancy test, result negative: Secondary | ICD-10-CM | POA: Insufficient documentation

## 2018-11-17 DIAGNOSIS — F17200 Nicotine dependence, unspecified, uncomplicated: Secondary | ICD-10-CM | POA: Insufficient documentation

## 2018-11-17 DIAGNOSIS — N809 Endometriosis, unspecified: Secondary | ICD-10-CM | POA: Insufficient documentation

## 2018-11-17 DIAGNOSIS — G8929 Other chronic pain: Secondary | ICD-10-CM | POA: Insufficient documentation

## 2018-11-17 DIAGNOSIS — E282 Polycystic ovarian syndrome: Secondary | ICD-10-CM | POA: Insufficient documentation

## 2018-11-17 DIAGNOSIS — R102 Pelvic and perineal pain: Secondary | ICD-10-CM | POA: Insufficient documentation

## 2018-11-17 HISTORY — DX: Endometriosis, unspecified: N80.9

## 2018-11-17 LAB — URINALYSIS, ROUTINE W REFLEX MICROSCOPIC
Bilirubin Urine: NEGATIVE
Glucose, UA: NEGATIVE mg/dL
Hgb urine dipstick: NEGATIVE
Ketones, ur: 15 mg/dL — AB
Leukocytes, UA: NEGATIVE
Nitrite: NEGATIVE
Protein, ur: NEGATIVE mg/dL
Specific Gravity, Urine: 1.025 (ref 1.005–1.030)
pH: 6 (ref 5.0–8.0)

## 2018-11-17 LAB — POCT PREGNANCY, URINE: Preg Test, Ur: NEGATIVE

## 2018-11-17 LAB — WET PREP, GENITAL
Clue Cells Wet Prep HPF POC: NONE SEEN
Sperm: NONE SEEN
Trich, Wet Prep: NONE SEEN
WBC, Wet Prep HPF POC: NONE SEEN
Yeast Wet Prep HPF POC: NONE SEEN

## 2018-11-17 MED ORDER — NAPROXEN 500 MG PO TABS: 500.0000 mg | ORAL_TABLET | Freq: Two times a day (BID) | ORAL | 0 refills | Status: DC

## 2018-11-17 MED ORDER — CYCLOBENZAPRINE HCL 10 MG PO TABS: 10.0000 mg | ORAL_TABLET | Freq: Two times a day (BID) | ORAL | 0 refills | Status: DC | PRN

## 2018-11-17 MED ORDER — KETOROLAC TROMETHAMINE 60 MG/2ML IM SOLN
60.0000 mg | Freq: Once | INTRAMUSCULAR | Status: AC
  Administered 2018-11-17: 60 mg via INTRAMUSCULAR
  Filled 2018-11-17: qty 2

## 2018-11-17 NOTE — MAU Note (Addendum)
Pt has hx of endometriosis, having LLQ pain, tugging, pulling, burning sensation, also cramps.  Worse with movement & bending.  Pain has been there a month, but is more intense now, tender to touch.  Also intensified pain with urination.  Denies bleeding.  Sees Dr. Marice Potter.

## 2018-11-17 NOTE — Discharge Instructions (Signed)
Endometriosis  Endometriosis is a condition in which the tissue that lines the uterus (endometrium) grows outside of its normal location. The tissue may grow in many locations close to the uterus, but it commonly grows on the ovaries, fallopian tubes, vagina, or bowel. When the uterus sheds the endometrium every menstrual cycle, there is bleeding wherever the endometrial tissue is located. This can cause pain because blood is irritating to tissues that are not normally exposed to it. What are the causes? The cause of endometriosis is not known. What increases the risk? You may be more likely to develop endometriosis if you:  Have a family history of endometriosis.  Have never given birth.  Started your period at age 10 or younger.  Have high levels of estrogen in your body.  Were exposed to a certain medicine (diethylstilbestrol) before you were born (in utero).  Had low birth weight.  Were born as a twin, triplet, or other multiple.  Have a BMI of less than 25. BMI is an estimate of body fat and is calculated from height and weight. What are the signs or symptoms? Often, there are no symptoms of this condition. If you do have symptoms, they may:  Vary depending on where your endometrial tissue is growing.  Occur during your menstrual period (most common) or midcycle.  Come and go, or you may go months with no symptoms at all.  Stop with menopause. Symptoms may include:  Pain in the back or abdomen.  Heavier bleeding during periods.  Pain during sex.  Painful bowel movements.  Infertility.  Pelvic pain.  Bleeding more than once a month. How is this diagnosed? This condition is diagnosed based on your symptoms and a physical exam. You may have tests, such as:  Blood tests and urine tests. These may be done to help rule out other possible causes of your symptoms.  Ultrasound, to look for abnormal tissues.  An X-ray of the lower bowel (barium enema).  An  ultrasound that is done through the vagina (transvaginally).  CT scan.  MRI.  Laparoscopy. In this procedure, a lighted, pencil-sized instrument called a laparoscope is inserted into your abdomen through an incision. The laparoscope allows your health care provider to look at the organs inside your body and check for abnormal tissue to confirm the diagnosis. If abnormal tissue is found, your health care provider may remove a small piece of tissue (biopsy) to be examined under a microscope. How is this treated? Treatment for this condition may include:  Medicines to relieve pain, such as NSAIDs.  Hormone therapy. This involves using artificial (synthetic) hormones to reduce endometrial tissue growth. Your health care provider may recommend using a hormonal form of birth control, or other medicines.  Surgery. This may be done to remove abnormal endometrial tissue. ? In some cases, tissue may be removed using a laparoscope and a laser (laparoscopic laser treatment). ? In severe cases, surgery may be done to remove the fallopian tubes, uterus, and ovaries (hysterectomy). Follow these instructions at home:  Take over-the-counter and prescription medicines only as told by your health care provider.  Do not drive or use heavy machinery while taking prescription pain medicine.  Try to avoid activities that cause pain, including sexual activity.  Keep all follow-up visits as told by your health care provider. This is important. Contact a health care provider if:  You have pain in the area between your hip bones (pelvic area) that occurs: ? Before, during, or after your period. ?   In between your period and gets worse during your period. ? During or after sex. ? With bowel movements or urination, especially during your period.  You have problems getting pregnant.  You have a fever. Get help right away if:  You have severe pain that does not get better with medicine.  You have severe  nausea and vomiting, or you cannot eat without vomiting.  You have pain that affects only the lower, right side of your abdomen.  You have abdominal pain that gets worse.  You have abdominal swelling.  You have blood in your stool. This information is not intended to replace advice given to you by your health care provider. Make sure you discuss any questions you have with your health care provider. Document Released: 09/26/2000 Document Revised: 07/04/2016 Document Reviewed: 03/01/2016 Elsevier Interactive Patient Education  2019 Elsevier Inc.   

## 2018-11-17 NOTE — MAU Provider Note (Signed)
History     CSN: [id number]  Arrival date and time: 11/17/18 1013   First Provider Initiated Contact with Patient 11/17/18 1128      Chief Complaint  Patient presents with  . Abdominal Pain   Lori Mcknight is a 33 y.o. G0P0000 with past hx of endometriosis and PCOS who is here today with lower abdominal pain x 1 month.   Pelvic Pain  The patient's primary symptoms include pelvic pain. The patient's pertinent negatives include no vaginal discharge. This is a new problem. The current episode started more than 1 month ago (10/17/18). The problem occurs constantly. The problem has been unchanged. Pain severity now: 7/10  The problem affects both sides. She is not pregnant. Associated symptoms include dysuria and nausea. Pertinent negatives include no chills, fever or vomiting. The vaginal discharge was normal. There has been no bleeding. The symptoms are aggravated by urinating and activity. She has tried nothing for the symptoms. She is sexually active. She uses nothing for contraception. Her menstrual history has been irregular (LMP 07/07/18). Her past medical history is significant for endometriosis. (PCOS )    OB History    Gravida  0   Para  0   Term  0   Preterm  0   AB  0   Living  0     SAB  0   TAB  0   Ectopic  0   Multiple  0   Live Births  0           Past Medical History:  Diagnosis Date  . Anemia    history of anemia  . Anxiety   . Asthma    inhaler as needed  . Chest pain of uncertain etiology    intermittent left side chest pain  . Chlamydia   . Depression   . Endometriosis   . Genital herpes   . Headache    MIGRAINES  . Hypertension    no meds since April. needs new px per pt   . Hypothyroidism   . Morbid obesity (HCC)   . Obese   . Obesity, Class III, BMI 40-49.9 (morbid obesity) (HCC) 11/06/2011  . PCOS (polycystic ovarian syndrome)   . PONV (postoperative nausea and vomiting)   . Shortness of breath    on exertion from weight  .  Thyroid disease   . Urinary tract infection     Past Surgical History:  Procedure Laterality Date  . DILATION AND CURETTAGE OF UTERUS    . DILATION AND CURETTAGE OF UTERUS N/A 04/29/2018   Procedure: DILATATION AND CURETTAGE;  Surgeon: Allie Bossierove, Myra C, MD;  Location: [facility];  Service: Gynecology;  Laterality: N/A;  . HYSTEROSCOPY W/D&C N/A 04/06/2014   Procedure: DILATATION AND CURETTAGE /HYSTEROSCOPY ;  Surgeon: Tereso NewcomerUgonna A Anyanwu, MD;  Location: [facility];  Service: Gynecology;  Laterality: N/A;  . LAPAROSCOPY N/A 04/29/2018   Procedure: LAPAROSCOPY DIAGNOSTIC WITH PERITONEAL BIOPSY;  Surgeon: Allie Bossierove, Myra C, MD;  Location: [facility];  Service: Gynecology;  Laterality: N/A;  . LEFT HEART CATH AND CORONARY ANGIOGRAPHY N/A 12/12/2016   Procedure: Left Heart Cath and Coronary Angiography;  Surgeon: Peter M SwazilandJordan, MD;  [facility];  Service: Cardiovascular;  Laterality: N/A;  . NO PAST SURGERIES    . WISDOM TOOTH EXTRACTION      Family History  Problem Relation Age of Onset  . Heart disease Mother   . Sleep apnea Mother   . Diabetes Father   .  Heart disease Father   . Heart disease Maternal Grandmother   . Diabetes Maternal Grandfather   . Heart disease Maternal Grandfather     Social History   Tobacco Use  . Smoking status: Current Some Day Smoker  . Smokeless tobacco: Never Used  . Tobacco comment: 2-3 cigerettes a week.  Substance Use Topics  . Alcohol use: Not Currently    Alcohol/week: 0.0 standard drinks    Comment: occasonal   . Drug use: Not Currently    Types: Marijuana    Allergies: No Known Allergies  Medications Prior to Admission  Medication Sig Dispense Refill Last Dose  . albuterol (PROVENTIL HFA;VENTOLIN HFA) 108 (90 Base) MCG/ACT inhaler Inhale 2 puffs into the lungs every 6 (six) hours as needed for wheezing or shortness of breath. 1 Inhaler 2 unk  . amitriptyline (ELAVIL) 50 MG tablet Take 1 tablet (50 mg total) by mouth at bedtime. (Patient not  taking: Reported on 09/24/2018) 90 tablet 0 Not Taking at Unknown time  . beclomethasone (QVAR) 40 MCG/ACT inhaler Inhale 1 puff into the lungs 2 (two) times daily as needed (for shortness of breath).    unk  . ciprofloxacin (CIPRO) 500 MG tablet Take 1 tablet (500 mg total) by mouth 2 (two) times daily. 14 tablet 0   . gabapentin (NEURONTIN) 300 MG capsule Take 1 capsule (300 mg total) by mouth at bedtime. 30 capsule 1 Past Month at Unknown time  . ibuprofen (ADVIL,MOTRIN) 200 MG tablet Take 400-600 mg by mouth every 6 (six) hours as needed for headache, mild pain or moderate pain.   Past Month at Unknown time  . ibuprofen (ADVIL,MOTRIN) 600 MG tablet Take 1 tablet (600 mg total) by mouth every 6 (six) hours as needed. (Patient not taking: Reported on 09/24/2018) 30 tablet 1 Not Taking at Unknown time  . levothyroxine (SYNTHROID, LEVOTHROID) 75 MCG tablet Take 1 tablet (75 mcg total) by mouth daily before breakfast for 30 days. 30 tablet 0   . methocarbamol (ROBAXIN) 500 MG tablet Take 1 tablet (500 mg total) by mouth every 6 (six) hours as needed for muscle spasms. (Patient not taking: Reported on 09/24/2018) 60 tablet 2 Completed Course at Unknown time  . metoprolol tartrate (LOPRESSOR) 50 MG tablet Take 1 tablet (50 mg total) by mouth 2 (two) times daily. 180 tablet 2 Past Week at Unknown time  . metroNIDAZOLE (FLAGYL) 500 MG tablet Take 1 tablet (500 mg total) by mouth 2 (two) times daily. 14 tablet 0   . naproxen (NAPROSYN) 500 MG tablet Take 1 tablet (500 mg total) by mouth 2 (two) times daily. (Patient not taking: Reported on 09/24/2018) 30 tablet 0 Completed Course at Unknown time  . oxyCODONE-acetaminophen (PERCOCET/ROXICET) 5-325 MG tablet Take 2 tablets by mouth every 4 (four) hours as needed for severe pain. 15 tablet 0     Review of Systems  Constitutional: Negative for chills and fever.  Gastrointestinal: Positive for nausea. Negative for vomiting.  Genitourinary: Positive for dysuria  and pelvic pain. Negative for vaginal bleeding and vaginal discharge.   Physical Exam   Blood pressure (!) 143/76, pulse 85, temperature 98.1 F (36.7 C), temperature source Oral, resp. rate 18, height 5\' 6"  (1.676 m), weight (!) 157.4 kg, last menstrual period 07/07/2018.  Physical Exam  Nursing note and vitals reviewed. Constitutional: She is oriented to person, place, and time. She appears well-developed and well-nourished. No distress.  HENT:  Head: Normocephalic.  Cardiovascular: Normal rate.  Respiratory: Effort normal.  GI: Soft. There is no abdominal tenderness. There is no rebound.  Genitourinary:    Genitourinary Comments:  External: no lesion Vagina: small amount of white discharge Cervix: pink, smooth, no CMT Uterus: difficult to assess 2/2 body habitus Adnexa: difficult to assess 2/2 body habitus    Neurological: She is alert and oriented to person, place, and time.  Skin: Skin is warm and dry.  Psychiatric: She has a normal mood and affect.    Results for orders placed or performed during the hospital encounter of 11/17/18 (from the past 24 hour(s))  Urinalysis, Routine w reflex microscopic     Status: Abnormal   Collection Time: 11/17/18 11:04 AM  Result Value Ref Range   Color, Urine YELLOW YELLOW   APPearance CLEAR CLEAR   Specific Gravity, Urine 1.025 1.005 - 1.030   pH 6.0 5.0 - 8.0   Glucose, UA NEGATIVE NEGATIVE mg/dL   Hgb urine dipstick NEGATIVE NEGATIVE   Bilirubin Urine NEGATIVE NEGATIVE   Ketones, ur 15 (A) NEGATIVE mg/dL   Protein, ur NEGATIVE NEGATIVE mg/dL   Nitrite NEGATIVE NEGATIVE   Leukocytes, UA NEGATIVE NEGATIVE  Pregnancy, urine POC     Status: None   Collection Time: 11/17/18 11:08 AM  Result Value Ref Range   Preg Test, Ur NEGATIVE NEGATIVE  Wet prep, genital     Status: None   Collection Time: 11/17/18 11:39 AM  Result Value Ref Range   Yeast Wet Prep HPF POC NONE SEEN NONE SEEN   Trich, Wet Prep NONE SEEN NONE SEEN   Clue  Cells Wet Prep HPF POC NONE SEEN NONE SEEN   WBC, Wet Prep HPF POC NONE SEEN NONE SEEN   Sperm NONE SEEN      MAU Course  Procedures  MDM Patient has had toradol. She reports that her pain has improved.   Assessment and Plan   1. Chronic pelvic pain in female   2. Endometriosis    DC home Comfort measures reviewed  RX: naproxen 500mg  BID, flexeril PRN #20  Return to MAU as needed FU with GYN   Follow-up Information    [facility].   Specialty:  Obstetrics and Gynecology Contact information: [address] [place] [address] [phone]            Thressa Sheller DNP, CNM  11/17/18  11:40 AM

## 2018-11-18 LAB — GC/CHLAMYDIA PROBE AMP (~~LOC~~) NOT AT ARMC
Chlamydia: NEGATIVE
NEISSERIA GONORRHEA: NEGATIVE

## 2018-12-13 ENCOUNTER — Other Ambulatory Visit: Payer: Self-pay

## 2018-12-13 ENCOUNTER — Other Ambulatory Visit: Payer: Self-pay | Admitting: Nurse Practitioner

## 2018-12-13 MED ORDER — LEVOTHYROXINE SODIUM 75 MCG PO TABS: 75.0000 ug | ORAL_TABLET | Freq: Every day | ORAL | 0 refills | Status: DC

## 2018-12-13 MED FILL — LEVOTHYROXINE 75 MCG TABLET: 75 | 30 days supply | Qty: 30 | Fill #0

## 2018-12-13 MED FILL — NAPROXEN 500 MG TABLET: 500 | 15 days supply | Qty: 30 | Fill #0

## 2018-12-13 MED FILL — CYCLOBENZAPRINE 10 MG TAB: 10 | 10 days supply | Qty: 20 | Fill #0

## 2018-12-13 NOTE — Telephone Encounter (Signed)
NEEDS TO MAKE LAB APPOINTMENT FOR TSH

## 2018-12-15 ENCOUNTER — Ambulatory Visit: Payer: Self-pay | Admitting: Nurse Practitioner

## 2019-02-08 ENCOUNTER — Other Ambulatory Visit: Payer: Self-pay

## 2019-02-08 ENCOUNTER — Other Ambulatory Visit: Payer: Self-pay | Admitting: Nurse Practitioner

## 2019-02-08 NOTE — Telephone Encounter (Signed)
Please schedule patient for a visit for refills

## 2019-02-11 NOTE — Telephone Encounter (Signed)
Called patient and LVM to return call and schedule appt for her to be able to get more refills.

## 2019-07-29 ENCOUNTER — Other Ambulatory Visit: Payer: Self-pay

## 2019-07-29 ENCOUNTER — Ambulatory Visit (HOSPITAL_COMMUNITY)
Admission: EM | Admit: 2019-07-29 | Discharge: 2019-07-29 | Disposition: A | Discharge status: Home / Self Care | Payer: Self-pay | Attending: Family Medicine | Admitting: Family Medicine

## 2019-07-29 ENCOUNTER — Encounter (HOSPITAL_COMMUNITY): Payer: Self-pay

## 2019-07-29 DIAGNOSIS — N3090 Cystitis, unspecified without hematuria: Secondary | ICD-10-CM | POA: Insufficient documentation

## 2019-07-29 DIAGNOSIS — Z3202 Encounter for pregnancy test, result negative: Secondary | ICD-10-CM

## 2019-07-29 LAB — POCT URINALYSIS DIP (DEVICE)
Glucose, UA: NEGATIVE mg/dL
Ketones, ur: NEGATIVE mg/dL
Nitrite: NEGATIVE
Protein, ur: 30 mg/dL — AB
Specific Gravity, Urine: 1.025 (ref 1.005–1.030)
Urobilinogen, UA: 0.2 mg/dL (ref 0.0–1.0)
pH: 6 (ref 5.0–8.0)

## 2019-07-29 LAB — POCT PREGNANCY, URINE: Preg Test, Ur: NEGATIVE

## 2019-07-29 MED ORDER — CEPHALEXIN 500 MG PO CAPS: 500.0000 mg | ORAL_CAPSULE | Freq: Two times a day (BID) | ORAL | 0 refills | Status: AC

## 2019-07-29 NOTE — ED Triage Notes (Signed)
Patient presents to Urgent Care with complaints of pelvic pain since 2 days ago, pains were sharp and stabbing. Patient reports when she urinates it burned, this has gotten slightly better.

## 2019-07-29 NOTE — Discharge Instructions (Addendum)
We are treating you for a urinary tract infection.  I am sending the urine for culture We will call you if we need to make any changes to your antibiotic treatment Also sending a swab for testing we will call you with any positive results. As we spoke about this could be also related to your PCOS You need to follow-up with your OB/GYN if this problem continues.

## 2019-07-30 LAB — URINE CULTURE: Culture: <10000 — AB

## 2019-07-30 NOTE — ED Provider Notes (Signed)
[facility]    CSN: [id number] Arrival date & time: 07/29/19  1233      History   Chief Complaint Chief Complaint  Patient presents with  . Pelvic Pain    HPI Lori Mcknight is a 33 y.o. female.   Pt is a 33 year old female with PMH of anemia, anxiety, asthma, chlamydia, endometriosis, genital herpes, migraines , obesity, PCOS, thyroid disease, UTI. She presents with lower abd cramping, back pain, x 2 days. Some associated dysuria and vaginal discharge. Symptoms have been constant, waxing and waning. No fever, N,V,D. LBM a few days ago. Hx of constipation. She is sexually active with 1 partner, unprotected. Irregular menstrual.   ROS per HPI      Past Medical History:  Diagnosis Date  . Anemia    history of anemia  . Anxiety   . Asthma    inhaler as needed  . Chest pain of uncertain etiology    intermittent left side chest pain  . Chlamydia   . Depression   . Endometriosis   . Genital herpes   . Headache    MIGRAINES  . Hypertension    no meds since April. needs new px per pt   . Hypothyroidism   . Morbid obesity (HCC)   . Obese   . Obesity, Class III, BMI 40-49.9 (morbid obesity) (HCC) 11/06/2011  . PCOS (polycystic ovarian syndrome)   . PONV (postoperative nausea and vomiting)   . Shortness of breath    on exertion from weight  . Thyroid disease   . Urinary tract infection     Patient Active Problem List   Diagnosis Date Noted  . Incomplete bladder emptying 02/18/2018  . DUB (dysfunctional uterine bleeding) 02/18/2018  . Anemia 02/18/2018  . Hypothyroid 01/29/2017  . Migraines 08/26/2016  . Chest pain 02/04/2016  . Abnormal uterine bleeding (AUB) 01/09/2014  . Thickened endometrium 12/02/2013  . Essential hypertension 10/21/2013  . PCOS (polycystic ovarian syndrome) 11/06/2011  . Infertility, female 11/06/2011  . Pelvic pain in female 08/01/2011    Past Surgical History:  Procedure Laterality Date  . DILATION AND CURETTAGE OF UTERUS     . DILATION AND CURETTAGE OF UTERUS N/A 04/29/2018   Procedure: DILATATION AND CURETTAGE;  Surgeon: Allie Bossier, MD;  Location: [facility];  Service: Gynecology;  Laterality: N/A;  . HYSTEROSCOPY W/D&C N/A 04/06/2014   Procedure: DILATATION AND CURETTAGE /HYSTEROSCOPY ;  Surgeon: Tereso Newcomer, MD;  Location: [facility];  Service: Gynecology;  Laterality: N/A;  . LAPAROSCOPY N/A 04/29/2018   Procedure: LAPAROSCOPY DIAGNOSTIC WITH PERITONEAL BIOPSY;  Surgeon: Allie Bossier, MD;  Location: [facility];  Service: Gynecology;  Laterality: N/A;  . LEFT HEART CATH AND CORONARY ANGIOGRAPHY N/A 12/12/2016   Procedure: Left Heart Cath and Coronary Angiography;  Surgeon: Peter M Swaziland, MD;  Location: [facility] INVASIVE CV LAB;  Service: Cardiovascular;  Laterality: N/A;  . NO PAST SURGERIES    . WISDOM TOOTH EXTRACTION      OB History    Gravida  0   Para  0   Term  0   Preterm  0   AB  0   Living  0     SAB  0   TAB  0   Ectopic  0   Multiple  0   Live Births  0            Home Medications    Prior to Admission medications   Medication  Sig Start Date End Date Taking? Authorizing Provider  albuterol (PROVENTIL HFA;VENTOLIN HFA) 108 (90 Base) MCG/ACT inhaler Inhale 2 puffs into the lungs every 6 (six) hours as needed for wheezing or shortness of breath. 01/19/18   Claiborne RiggFleming, Zelda W, NP  cephALEXin (KEFLEX) 500 MG capsule Take 1 capsule (500 mg total) by mouth 2 (two) times daily for 7 days. 07/29/19 08/05/19  Dahlia ByesBast, Laela Deviney A, NP  cyclobenzaprine (FLEXERIL) 10 MG tablet Take 1 tablet (10 mg total) by mouth 2 (two) times daily as needed for muscle spasms. 11/17/18   Armando ReichertHogan, Heather D, CNM  ibuprofen (ADVIL,MOTRIN) 600 MG tablet Take 1 tablet (600 mg total) by mouth every 6 (six) hours as needed. 04/29/18   Allie Bossierove, Myra C, MD  levothyroxine (SYNTHROID, LEVOTHROID) 75 MCG tablet Take 1 tablet (75 mcg total) by mouth daily before breakfast for 15 days. NEEDS TO MAKE LAB APPOINTMENT 12/13/18 12/28/18  Claiborne RiggFleming,  Zelda W, NP  [facility](SALONPAS PAIN RELIEF PATCH EX) Apply 1 patch topically daily as needed (For back pain.).    [provider]  metoprolol tartrate (LOPRESSOR) 50 MG tablet Take 50 mg by mouth 2 (two) times daily.    [provider]  naproxen (NAPROSYN) 500 MG tablet Take 1 tablet (500 mg total) by mouth 2 (two) times daily. 11/17/18   Armando ReichertHogan, Heather D, CNM    Family History Family History  Problem Relation Age of Onset  . Heart disease Mother   . Sleep apnea Mother   . Diabetes Father   . Heart disease Father   . Heart disease Maternal Grandmother   . Diabetes Maternal Grandfather   . Heart disease Maternal Grandfather     Social History Social History   Tobacco Use  . Smoking status: Current Some Day Smoker  . Smokeless tobacco: Never Used  . Tobacco comment: 2-3 cigerettes a week.  Substance Use Topics  . Alcohol use: Yes    Alcohol/week: 0.0 standard drinks    Comment: occasonal   . Drug use: Not Currently    Types: Marijuana     Allergies   Patient has no known allergies.   Review of Systems Review of Systems   Physical Exam Triage Vital Signs ED Triage Vitals  Enc Vitals Group     BP 07/29/19 1304 139/87     Pulse Rate 07/29/19 1304 91     Resp 07/29/19 1304 18     Temp 07/29/19 1304 98.5 F (36.9 C)     Temp Source 07/29/19 1304 Oral     SpO2 07/29/19 1304 97 %     Weight --      Height --      Head Circumference --      Peak Flow --      Pain Score 07/29/19 1302 8     Pain Loc --      Pain Edu? --      Excl. in GC? --    No data found.  Updated Vital Signs BP 139/87 (BP Location: Right Wrist)   Pulse 91   Temp 98.5 F (36.9 C) (Oral)   Resp 18   SpO2 97%   Visual Acuity Right Eye Distance:   Left Eye Distance:   Bilateral Distance:    Right Eye Near:   Left Eye Near:    Bilateral Near:     Physical Exam Vitals signs and nursing note reviewed.  Constitutional:      General: She is not in acute distress.  Appearance: Normal appearance. She is obese. She is not ill-appearing, toxic-appearing or diaphoretic.     Comments: Limited exam due to body habitus  HENT:     Head: Normocephalic.     Nose: Nose normal.     Mouth/Throat:     Pharynx: Oropharynx is clear.  Eyes:     Conjunctiva/sclera: Conjunctivae normal.  Neck:     Musculoskeletal: Normal range of motion.  Pulmonary:     Effort: Pulmonary effort is normal.  Abdominal:     General: Abdomen is protuberant. There is no distension or abdominal bruit.     Palpations: Abdomen is soft.     Tenderness: There is generalized abdominal tenderness and tenderness in the suprapubic area. There is no right CVA tenderness, left CVA tenderness, guarding or rebound.  Musculoskeletal: Normal range of motion.  Skin:    General: Skin is warm and dry.     Findings: No rash.  Neurological:     Mental Status: She is alert.  Psychiatric:        Mood and Affect: Mood normal.      UC Treatments / Results  Labs (all labs ordered are listed, but only abnormal results are displayed) Labs Reviewed  URINE CULTURE - Abnormal; Notable for the following components:      Result Value   Culture   (*)    Value: <10,000 COLONIES/mL INSIGNIFICANT GROWTH Performed at [facility], [address] [place], [place] [address]    All other components within normal limits  POCT URINALYSIS DIP (DEVICE) - Abnormal; Notable for the following components:   Bilirubin Urine SMALL (*)    Hgb urine dipstick MODERATE (*)    Protein, ur 30 (*)    Leukocytes,Ua TRACE (*)    All other components within normal limits  POC URINE PREG, ED  POCT PREGNANCY, URINE  CERVICOVAGINAL ANCILLARY ONLY    EKG   Radiology No results found.  Procedures Procedures (including critical care time)  Medications Ordered in UC Medications - No data to display  Initial Impression / Assessment and Plan / UC Course  I have reviewed the triage vital signs and the nursing  notes.  Pertinent labs & imaging results that were available during my care of the patient were reviewed by me and considered in my medical decision making (see chart for details).     Cystitis- based on urine treating for UTI pending culture.  Other differential include; BV, STD, PCOS, endometriosis, constipation.  Swab sent for testing.  No concern for PID Follow up with OB/GYN for continued symptoms.  Follow up as needed for continued or worsening symptoms  Final Clinical Impressions(s) / UC Diagnoses   Final diagnoses:  Cystitis     Discharge Instructions     We are treating you for a urinary tract infection.  I am sending the urine for culture We will call you if we need to make any changes to your antibiotic treatment Also sending a swab for testing we will call you with any positive results. As we spoke about this could be also related to your PCOS You need to follow-up with your OB/GYN if this problem continues.    ED Prescriptions    Medication Sig Dispense Auth. Provider   cephALEXin (KEFLEX) 500 MG capsule Take 1 capsule (500 mg total) by mouth 2 (two) times daily for 7 days. 14 capsule Taliana Mersereau A, NP     PDMP not reviewed this encounter.   Rozanna Box, Dorri Ozturk A,  NP 07/30/19 1159

## 2019-08-02 ENCOUNTER — Telehealth (HOSPITAL_COMMUNITY): Payer: Self-pay | Admitting: Emergency Medicine

## 2019-08-02 ENCOUNTER — Telehealth (HOSPITAL_COMMUNITY): Payer: Self-pay | Admitting: Family Medicine

## 2019-08-02 LAB — CERVICOVAGINAL ANCILLARY ONLY
Bacterial vaginitis: POSITIVE — AB
Candida vaginitis: POSITIVE — AB
Chlamydia: NEGATIVE
Neisseria Gonorrhea: NEGATIVE
Trichomonas: POSITIVE — AB

## 2019-08-02 MED ORDER — FLUCONAZOLE 150 MG PO TABS: 150.0000 mg | ORAL_TABLET | Freq: Once | ORAL | 0 refills | Status: AC

## 2019-08-02 MED ORDER — METRONIDAZOLE 500 MG PO TABS: 500.0000 mg | ORAL_TABLET | Freq: Two times a day (BID) | ORAL | 0 refills | Status: DC

## 2019-08-02 MED ORDER — DOXYCYCLINE HYCLATE 100 MG PO CAPS: 100.0000 mg | ORAL_CAPSULE | Freq: Two times a day (BID) | ORAL | 0 refills | Status: DC

## 2019-08-02 MED ORDER — TRAMADOL HCL 50 MG PO TABS: 50.0000 mg | ORAL_TABLET | Freq: Four times a day (QID) | ORAL | 0 refills | Status: AC | PRN

## 2019-08-02 NOTE — Telephone Encounter (Signed)
Bacterial vaginosis is positive. This was not treated at the urgent care visit.  Flagyl 500 mg BID x 7 days #14 no refills sent to patients pharmacy of choice.    Test for candida (yeast) was positive.  Prescription for fluconazole 150mg  po now, repeat dose in 3d if needed, #2 no refills, sent to the pharmacy of record.  Recheck or followup with PCP for further evaluation if symptoms are not improving.    Trichomonas is positive. Rx  for Flagyl 2 grams, once was sent to the pharmacy of record. Pt needs education to refrain from sexual intercourse for 7 days to give the medicine time to work. Sexual partners need to be notified and tested/treated. Condoms may reduce risk of reinfection. Recheck for further evaluation if symptoms are not improving.   Patient contacted and made aware of    results, all questions answered  Pt c/o pelvic pain, asking for pain medication, reported pt symptoms to traci who is concerend for PID, will send doxy x7 days per Tressia Miners and Traci will send tramadol for patient.

## 2019-08-02 NOTE — Telephone Encounter (Signed)
Pain medication sent to treat patients pelvic pain.  Also treated for PID, BV, yeast and trich.

## 2019-08-03 ENCOUNTER — Telehealth (HOSPITAL_COMMUNITY): Payer: Self-pay

## 2019-08-03 NOTE — Telephone Encounter (Signed)
Pt called stating she vomited after taking both antibiotics together this morning. Pt stated she had an empty stomach when taking both of them. Pt is informed to take doxycycline on empty stomach, wait a couple of hours, consume food, then take the other antibiotic. Pt is informed to call us back if this does not work Midwife. Pt showed understanding.

## 2019-08-06 ENCOUNTER — Emergency Department (HOSPITAL_COMMUNITY): Payer: Self-pay

## 2019-08-06 ENCOUNTER — Other Ambulatory Visit: Payer: Self-pay

## 2019-08-06 ENCOUNTER — Inpatient Hospital Stay (HOSPITAL_COMMUNITY)
Admission: EM | Admit: 2019-08-06 | Discharge: 2019-08-14 | DRG: 392 | Disposition: A | Discharge status: Home / Self Care | Payer: Self-pay | Attending: Family Medicine | Admitting: Family Medicine

## 2019-08-06 ENCOUNTER — Encounter (HOSPITAL_COMMUNITY): Payer: Self-pay

## 2019-08-06 DIAGNOSIS — Z20828 Contact with and (suspected) exposure to other viral communicable diseases: Secondary | ICD-10-CM | POA: Diagnosis present

## 2019-08-06 DIAGNOSIS — A5903 Trichomonal cystitis and urethritis: Secondary | ICD-10-CM | POA: Diagnosis present

## 2019-08-06 DIAGNOSIS — F17210 Nicotine dependence, cigarettes, uncomplicated: Secondary | ICD-10-CM | POA: Diagnosis present

## 2019-08-06 DIAGNOSIS — Z800 Family history of malignant neoplasm of digestive organs: Secondary | ICD-10-CM

## 2019-08-06 DIAGNOSIS — E162 Hypoglycemia, unspecified: Secondary | ICD-10-CM | POA: Diagnosis not present

## 2019-08-06 DIAGNOSIS — F419 Anxiety disorder, unspecified: Secondary | ICD-10-CM | POA: Diagnosis present

## 2019-08-06 DIAGNOSIS — Z79890 Hormone replacement therapy (postmenopausal): Secondary | ICD-10-CM

## 2019-08-06 DIAGNOSIS — R300 Dysuria: Secondary | ICD-10-CM

## 2019-08-06 DIAGNOSIS — K5792 Diverticulitis of intestine, part unspecified, without perforation or abscess without bleeding: Secondary | ICD-10-CM | POA: Diagnosis present

## 2019-08-06 DIAGNOSIS — Z6841 Body Mass Index (BMI) 40.0 and over, adult: Secondary | ICD-10-CM

## 2019-08-06 DIAGNOSIS — E876 Hypokalemia: Secondary | ICD-10-CM | POA: Diagnosis not present

## 2019-08-06 DIAGNOSIS — K5780 Diverticulitis of intestine, part unspecified, with perforation and abscess without bleeding: Secondary | ICD-10-CM | POA: Diagnosis present

## 2019-08-06 DIAGNOSIS — N809 Endometriosis, unspecified: Secondary | ICD-10-CM

## 2019-08-06 DIAGNOSIS — B3741 Candidal cystitis and urethritis: Secondary | ICD-10-CM | POA: Diagnosis present

## 2019-08-06 DIAGNOSIS — K5720 Diverticulitis of large intestine with perforation and abscess without bleeding: Principal | ICD-10-CM | POA: Diagnosis present

## 2019-08-06 DIAGNOSIS — I10 Essential (primary) hypertension: Secondary | ICD-10-CM | POA: Diagnosis present

## 2019-08-06 DIAGNOSIS — Z79899 Other long term (current) drug therapy: Secondary | ICD-10-CM

## 2019-08-06 DIAGNOSIS — E039 Hypothyroidism, unspecified: Secondary | ICD-10-CM | POA: Diagnosis present

## 2019-08-06 DIAGNOSIS — Z8249 Family history of ischemic heart disease and other diseases of the circulatory system: Secondary | ICD-10-CM

## 2019-08-06 DIAGNOSIS — E282 Polycystic ovarian syndrome: Secondary | ICD-10-CM | POA: Diagnosis present

## 2019-08-06 LAB — COMPREHENSIVE METABOLIC PANEL
ALT: 27 U/L (ref 0–44)
AST: 33 U/L (ref 15–41)
Albumin: 4.4 g/dL (ref 3.5–5.0)
Alkaline Phosphatase: 84 U/L (ref 38–126)
Anion gap: 9 (ref 5–15)
BUN: 8 mg/dL (ref 6–20)
CO2: 25 mmol/L (ref 22–32)
Calcium: 9.2 mg/dL (ref 8.9–10.3)
Chloride: 104 mmol/L (ref 98–111)
Creatinine, Ser: 0.99 mg/dL (ref 0.44–1.00)
GFR calc Af Amer: >60 mL/min (ref >60)
GFR calc non Af Amer: >60 mL/min (ref >60)
Glucose, Bld: 120 mg/dL — ABNORMAL HIGH (ref 70–99)
Potassium: 3.4 mmol/L — ABNORMAL LOW (ref 3.5–5.1)
Sodium: 138 mmol/L (ref 135–145)
Total Bilirubin: 0.7 mg/dL (ref 0.3–1.2)
Total Protein: 8.8 g/dL — ABNORMAL HIGH (ref 6.5–8.1)

## 2019-08-06 LAB — URINALYSIS, ROUTINE W REFLEX MICROSCOPIC
Bacteria, UA: NONE SEEN
Bilirubin Urine: NEGATIVE
Glucose, UA: NEGATIVE mg/dL
Hgb urine dipstick: NEGATIVE
Ketones, ur: NEGATIVE mg/dL
Nitrite: NEGATIVE
Protein, ur: 30 mg/dL — AB
Specific Gravity, Urine: 1.018 (ref 1.005–1.030)
pH: 6 (ref 5.0–8.0)

## 2019-08-06 LAB — CBC
HCT: 47.9 % — ABNORMAL HIGH (ref 36.0–46.0)
Hemoglobin: 15.3 g/dL — ABNORMAL HIGH (ref 12.0–15.0)
MCH: 31.1 pg (ref 26.0–34.0)
MCHC: 31.9 g/dL (ref 30.0–36.0)
MCV: 97.4 fL (ref 80.0–100.0)
Platelets: 230 10*3/uL (ref 150–400)
RBC: 4.92 MIL/uL (ref 3.87–5.11)
RDW: 13 % (ref 11.5–15.5)
WBC: 8.5 10*3/uL (ref 4.0–10.5)
nRBC: 0 % (ref 0.0–0.2)

## 2019-08-06 LAB — I-STAT BETA HCG BLOOD, ED (MC, WL, AP ONLY): I-stat hCG, quantitative: <5 m[IU]/mL (ref <5)

## 2019-08-06 LAB — LIPASE, BLOOD: Lipase: 23 U/L (ref 11–51)

## 2019-08-06 MED ORDER — IOHEXOL 300 MG/ML  SOLN
100.0000 mL | Freq: Once | INTRAMUSCULAR | Status: AC | PRN
  Administered 2019-08-06: 100 mL via INTRAVENOUS

## 2019-08-06 MED ORDER — SODIUM CHLORIDE (PF) 0.9 % IJ SOLN
INTRAMUSCULAR | Status: AC
  Filled 2019-08-06: qty 50

## 2019-08-06 MED ORDER — PIPERACILLIN-TAZOBACTAM 3.375 G IVPB 30 MIN
3.3750 g | Freq: Once | INTRAVENOUS | Status: AC
  Administered 2019-08-07: 3.375 g via INTRAVENOUS
  Filled 2019-08-06: qty 50

## 2019-08-06 MED ORDER — HYDROMORPHONE HCL 1 MG/ML IJ SOLN
1.0000 mg | Freq: Once | INTRAMUSCULAR | Status: AC
  Administered 2019-08-06: 1 mg via INTRAVENOUS
  Filled 2019-08-06: qty 1

## 2019-08-06 MED ORDER — SODIUM CHLORIDE 0.9 % IV BOLUS
1000.0000 mL | Freq: Once | INTRAVENOUS | Status: AC
  Administered 2019-08-06: 1000 mL via INTRAVENOUS

## 2019-08-06 MED ORDER — ONDANSETRON HCL 4 MG/2ML IJ SOLN
4.0000 mg | Freq: Once | INTRAMUSCULAR | Status: AC | PRN
  Administered 2019-08-06: 4 mg via INTRAVENOUS
  Filled 2019-08-06: qty 2

## 2019-08-06 MED ORDER — SODIUM CHLORIDE 0.9% FLUSH
3.0000 mL | Freq: Once | INTRAVENOUS | Status: AC
  Administered 2019-08-06: 3 mL via INTRAVENOUS

## 2019-08-06 NOTE — ED Notes (Signed)
Patient transported to CT 

## 2019-08-06 NOTE — ED Provider Notes (Signed)
[facility] Provider Note   CSN: [id number] Arrival date & time: 08/06/19  1758     History   Chief Complaint Chief Complaint  Patient presents with  . Urinary Tract Infection    HPI Lori Mcknight is a 33 y.o. female.     HPI  33 year old female presents with lower abdominal pain.  Has been feeling poorly with abdominal pain since about 10/12.  At first he was having trouble urinating and having a bowel movement.  Went to urgent care and was diagnosed with cystitis.  She was also later found to have trichomonas and yeast and was prescribed Flagyl, Diflucan, and eventually doxycycline given continued abdominal pain.  She denies any vaginal bleeding or discharge.  The pain is midline lower abdomen and left side.  There is no new back pain.  She did have a low-grade temperature of about 100.2 yesterday and has had some episodes of vomiting over the last day or so.  Has dysuria.  Past Medical History:  Diagnosis Date  . Anemia    history of anemia  . Anxiety   . Asthma    inhaler as needed  . Chest pain of uncertain etiology    intermittent left side chest pain  . Chlamydia   . Depression   . Endometriosis   . Genital herpes   . Headache    MIGRAINES  . Hypertension    no meds since April. needs new px per pt   . Hypothyroidism   . Morbid obesity (HCC)   . Obese   . Obesity, Class III, BMI 40-49.9 (morbid obesity) (HCC) 11/06/2011  . PCOS (polycystic ovarian syndrome)   . PONV (postoperative nausea and vomiting)   . Shortness of breath    on exertion from weight  . Thyroid disease   . Urinary tract infection     Patient Active Problem List   Diagnosis Date Noted  . Incomplete bladder emptying 02/18/2018  . DUB (dysfunctional uterine bleeding) 02/18/2018  . Anemia 02/18/2018  . Hypothyroid 01/29/2017  . Migraines 08/26/2016  . Chest pain 02/04/2016  . Abnormal uterine bleeding (AUB) 01/09/2014  . Thickened endometrium  12/02/2013  . Essential hypertension 10/21/2013  . PCOS (polycystic ovarian syndrome) 11/06/2011  . Infertility, female 11/06/2011  . Pelvic pain in female 08/01/2011    Past Surgical History:  Procedure Laterality Date  . DILATION AND CURETTAGE OF UTERUS    . DILATION AND CURETTAGE OF UTERUS N/A 04/29/2018   Procedure: DILATATION AND CURETTAGE;  Surgeon: Allie Bossier, MD;  Location: [facility];  Service: Gynecology;  Laterality: N/A;  . HYSTEROSCOPY W/D&C N/A 04/06/2014   Procedure: DILATATION AND CURETTAGE /HYSTEROSCOPY ;  Surgeon: Tereso Newcomer, MD;  Location: [facility];  Service: Gynecology;  Laterality: N/A;  . LAPAROSCOPY N/A 04/29/2018   Procedure: LAPAROSCOPY DIAGNOSTIC WITH PERITONEAL BIOPSY;  Surgeon: Allie Bossier, MD;  Location: [facility];  Service: Gynecology;  Laterality: N/A;  . LEFT HEART CATH AND CORONARY ANGIOGRAPHY N/A 12/12/2016   Procedure: Left Heart Cath and Coronary Angiography;  Surgeon: Peter M Swaziland, MD;  Location: [facility] INVASIVE CV LAB;  Service: Cardiovascular;  Laterality: N/A;  . NO PAST SURGERIES    . WISDOM TOOTH EXTRACTION       OB History    Gravida  0   Para  0   Term  0   Preterm  0   AB  0   Living  0     SAB  0   TAB  0   Ectopic  0   Multiple  0   Live Births  0            Home Medications    Prior to Admission medications   Medication Sig Start Date End Date Taking? Authorizing Provider  albuterol (PROVENTIL HFA;VENTOLIN HFA) 108 (90 Base) MCG/ACT inhaler Inhale 2 puffs into the lungs every 6 (six) hours as needed for wheezing or shortness of breath. 01/19/18  Yes Claiborne RiggFleming, Zelda W, NP  doxycycline (VIBRAMYCIN) 100 MG capsule Take 1 capsule (100 mg total) by mouth 2 (two) times daily for 7 days. 08/02/19 08/09/19 Yes Bast, Traci A, NP  ibuprofen (ADVIL,MOTRIN) 600 MG tablet Take 1 tablet (600 mg total) by mouth every 6 (six) hours as needed. 04/29/18  Yes Dove, Myra C, MD  levothyroxine (SYNTHROID, LEVOTHROID) 75 MCG tablet Take 1  tablet (75 mcg total) by mouth daily before breakfast for 15 days. NEEDS TO MAKE LAB APPOINTMENT 12/13/18 08/06/19 Yes Claiborne RiggFleming, Zelda W, NP  metroNIDAZOLE (FLAGYL) 500 MG tablet Take 1 tablet (500 mg total) by mouth 2 (two) times daily for 7 days. 08/02/19 08/09/19 Yes Bast, Traci A, NP  cyclobenzaprine (FLEXERIL) 10 MG tablet Take 1 tablet (10 mg total) by mouth 2 (two) times daily as needed for muscle spasms. Patient not taking: Reported on 08/06/2019 11/17/18   Thressa ShellerHogan, Heather D, CNM  naproxen (NAPROSYN) 500 MG tablet Take 1 tablet (500 mg total) by mouth 2 (two) times daily. Patient not taking: Reported on 08/06/2019 11/17/18   Armando ReichertHogan, Heather D, CNM    Family History Family History  Problem Relation Age of Onset  . Heart disease Mother   . Sleep apnea Mother   . Diabetes Father   . Heart disease Father   . Heart disease Maternal Grandmother   . Diabetes Maternal Grandfather   . Heart disease Maternal Grandfather     Social History Social History   Tobacco Use  . Smoking status: Current Some Day Smoker  . Smokeless tobacco: Never Used  . Tobacco comment: 2-3 cigerettes a week.  Substance Use Topics  . Alcohol use: Yes    Alcohol/week: 0.0 standard drinks    Comment: occasonal   . Drug use: Not Currently    Types: Marijuana     Allergies   Patient has no known allergies.   Review of Systems Review of Systems  Constitutional: Positive for fever.  Gastrointestinal: Positive for abdominal pain, nausea and vomiting.  Genitourinary: Positive for dysuria. Negative for vaginal bleeding and vaginal discharge.  Musculoskeletal: Negative for back pain.  All other systems reviewed and are negative.    Physical Exam Updated Vital Signs BP (!) 159/93 (BP Location: Left Arm)   Pulse 86   Temp 98.1 F (36.7 C) (Oral)   Resp 16   Ht 5\' 6"  (1.676 m)   Wt (!) 154.2 kg   SpO2 100%   BMI 54.87 kg/m   Physical Exam Vitals signs and nursing note reviewed.  Constitutional:       Appearance: She is well-developed. She is obese.  HENT:     Head: Normocephalic and atraumatic.     Right Ear: External ear normal.     Left Ear: External ear normal.     Nose: Nose normal.  Eyes:     General:        Right eye: No discharge.        Left eye: No discharge.  Cardiovascular:  Rate and Rhythm: Regular rhythm. Tachycardia present.     Heart sounds: Normal heart sounds.  Pulmonary:     Effort: Pulmonary effort is normal.     Breath sounds: Normal breath sounds.  Abdominal:     Palpations: Abdomen is soft.     Tenderness: There is abdominal tenderness in the right lower quadrant, periumbilical area, suprapubic area, left upper quadrant and left lower quadrant. There is no right CVA tenderness or left CVA tenderness.  Skin:    General: Skin is warm and dry.  Neurological:     Mental Status: She is alert.  Psychiatric:        Mood and Affect: Mood is not anxious.      ED Treatments / Results  Labs (all labs ordered are listed, but only abnormal results are displayed) Labs Reviewed  COMPREHENSIVE METABOLIC PANEL - Abnormal; Notable for the following components:      Result Value   Potassium 3.4 (*)    Glucose, Bld 120 (*)    Total Protein 8.8 (*)    All other components within normal limits  CBC - Abnormal; Notable for the following components:   Hemoglobin 15.3 (*)    HCT 47.9 (*)    All other components within normal limits  URINALYSIS, ROUTINE W REFLEX MICROSCOPIC - Abnormal; Notable for the following components:   Color, Urine AMBER (*)    Protein, ur 30 (*)    Leukocytes,Ua SMALL (*)    All other components within normal limits  SARS CORONAVIRUS 2 (TAT 6-24 HRS)  LIPASE, BLOOD  I-STAT BETA HCG BLOOD, ED ([facility], [facility], [facility] ONLY)    EKG None  Radiology Ct Abdomen Pelvis W Contrast  Result Date: 08/06/2019 CLINICAL DATA:  Abdominal pain, diverticulitis suspected, UTI for over a week, left-sided abdominal pain EXAM: CT ABDOMEN AND PELVIS WITH CONTRAST  TECHNIQUE: Multidetector CT imaging of the abdomen and pelvis was performed using the standard protocol following bolus administration of intravenous contrast. CONTRAST:  129mL OMNIPAQUE IOHEXOL 300 MG/ML  SOLN COMPARISON:  CT abdomen pelvis 09/24/2018 FINDINGS: Lower chest: Atelectatic changes in the left lung base. Lung bases are otherwise clear. Normal heart size. No pericardial effusion. Hepatobiliary: No focal liver abnormality is seen. No gallstones, gallbladder wall thickening, or biliary dilatation. Pancreas: Unremarkable. No pancreatic ductal dilatation or surrounding inflammatory changes. Spleen: Normal in size without focal abnormality. Adrenals/Urinary Tract: Adrenal glands are unremarkable. Kidneys are normal, without renal calculi, focal lesion, or hydronephrosis. Urinary bladder is largely decompressed at the time exam. There is however asymmetric mural thickening and hypoattenuation within the bladder wall and loss of clear lead discernible fat plane between the bladder and associated rim enhancing collection between the bladder and proximal sigmoid colon. Stomach/Bowel: Distal esophagus, stomach and duodenal sweep are unremarkable. No small bowel wall thickening or dilatation. No evidence of obstruction. A normal appendix is visualized. Proximal colon is unremarkable. There is segmental thickening of the sigmoid colon centered upon a region of numerous colonic diverticula and an area of inflammation which was present on comparison exam from 09/24/2018. There is now a rim enhancing collection extending from the wall with surrounding phlegmonous change in reactive adenopathy as well as some adjacent low-attenuation free fluid as well. No resulting obstruction. No extraluminal gas. Vascular/Lymphatic: The aorta is normal caliber. Reactive adenopathy in the low pelvis in the region of phlegmonous change. Reproductive: Anteverted uterus. The left ovary is in close proximity to the region of inflammation  in the left lower quadrant, positioned posteriorly  as evidence by the course of the left gonadal vein. There is loss of discernible fat plane of the anterior wall of left ovary. The right ovary is unremarkable. Other: Free fluid and phlegmonous change in the left lower quadrant and deep pelvis with a rim enhancing collection measuring approximately 4 x 3.8 x 4.5 cm in size concerning for a contained perforation or abscess formation. Loss of discernible fat planes and reactive stranding of the bladder could raise suspicion for possible colovesicular fistulization. No other fluid collections are evident. No free air. No bowel containing hernias. Musculoskeletal: No acute osseous abnormality or suspicious osseous lesion. Degenerative changes in the spine are maximal at L5-S1. Additional vacuum phenomenon and early degenerative sclerosis noted at the SI joints. IMPRESSION: 1. Acute diverticulitis of the sigmoid colon centered upon a region of inflammation which was present on comparison exam from 09/24/2018. There is now a 4 x 3.8 x 4.5 cm rim enhancing collection extending from the colonic wall with surrounding phlegmonous change in the left lower quadrant and deep pelvis, concerning for a contained perforation or abscess formation. Loss of discernible fat planes and reactive bladder wall thickening and perivesicular stranding/features of cystitis raise suspicion for possible colovesicular fistulization. Such a finding could be further evaluated with a limited CT of the pelvis following rectal contrast administration. 2. Additional stranding and hyperemia of the left ovary which is in close proximity to the rim enhancing collection. Finding is nonspecific though a colo-ovarian fistula is not fully excluded. Electronically Signed   By: Kreg Shropshire M.D.   On: 08/06/2019 23:34    Procedures Procedures (including critical care time)  Medications Ordered in ED Medications  piperacillin-tazobactam (ZOSYN) IVPB 3.375  g (has no administration in time range)  sodium chloride flush (NS) 0.9 % injection 3 mL (3 mLs Intravenous Given 08/06/19 2248)  sodium chloride 0.9 % bolus 1,000 mL (1,000 mLs Intravenous New Bag/Given 08/06/19 2311)  HYDROmorphone (DILAUDID) injection 1 mg (1 mg Intravenous Given 08/06/19 2250)  ondansetron (ZOFRAN) injection 4 mg (4 mg Intravenous Given 08/06/19 2248)  iohexol (OMNIPAQUE) 300 MG/ML solution 100 mL (100 mLs Intravenous Contrast Given 08/06/19 2254)     Initial Impression / Assessment and Plan / ED Course  I have reviewed the triage vital signs and the nursing notes.  Pertinent labs & imaging results that were available during my care of the patient were reviewed by me and considered in my medical decision making (see chart for details).        CT scan confirms diverticulitis but also shows small likely abscess.  Hemodynamically she is stable.  Given IV Dilaudid for pain.  Will give IV Zosyn.  At this point I do not think she needs emergent surgery though may need IR drainage if possible.  Dr. Debby Bud to admit.  Final Clinical Impressions(s) / ED Diagnoses   Final diagnoses:  Diverticulitis of large intestine with abscess without bleeding    ED Discharge Orders    None       Pricilla Loveless, MD 08/07/19 0003

## 2019-08-06 NOTE — ED Triage Notes (Signed)
Pt states that she has had a UTI for over a week. Pt states that she has had left sided abd pain as well. Pt states that it burns internally when she pees. Pt also has a hx of endometriosis, and is concerned if that flaring up.

## 2019-08-07 ENCOUNTER — Inpatient Hospital Stay (HOSPITAL_COMMUNITY): Payer: Self-pay

## 2019-08-07 DIAGNOSIS — K5780 Diverticulitis of intestine, part unspecified, with perforation and abscess without bleeding: Secondary | ICD-10-CM | POA: Diagnosis present

## 2019-08-07 DIAGNOSIS — I10 Essential (primary) hypertension: Secondary | ICD-10-CM

## 2019-08-07 DIAGNOSIS — K5720 Diverticulitis of large intestine with perforation and abscess without bleeding: Principal | ICD-10-CM

## 2019-08-07 DIAGNOSIS — K5792 Diverticulitis of intestine, part unspecified, without perforation or abscess without bleeding: Secondary | ICD-10-CM | POA: Diagnosis present

## 2019-08-07 DIAGNOSIS — E039 Hypothyroidism, unspecified: Secondary | ICD-10-CM

## 2019-08-07 HISTORY — DX: Diverticulitis of intestine, part unspecified, without perforation or abscess without bleeding: K57.92

## 2019-08-07 LAB — BASIC METABOLIC PANEL
Anion gap: 8 (ref 5–15)
BUN: 7 mg/dL (ref 6–20)
CO2: 23 mmol/L (ref 22–32)
Calcium: 8.7 mg/dL — ABNORMAL LOW (ref 8.9–10.3)
Chloride: 107 mmol/L (ref 98–111)
Creatinine, Ser: 0.76 mg/dL (ref 0.44–1.00)
GFR calc Af Amer: >60 mL/min (ref >60)
GFR calc non Af Amer: >60 mL/min (ref >60)
Glucose, Bld: 97 mg/dL (ref 70–99)
Potassium: 3.3 mmol/L — ABNORMAL LOW (ref 3.5–5.1)
Sodium: 138 mmol/L (ref 135–145)

## 2019-08-07 LAB — SARS CORONAVIRUS 2 (TAT 6-24 HRS): SARS Coronavirus 2: NEGATIVE

## 2019-08-07 LAB — CBC
HCT: 43.9 % (ref 36.0–46.0)
Hemoglobin: 14.1 g/dL (ref 12.0–15.0)
MCH: 31 pg (ref 26.0–34.0)
MCHC: 32.1 g/dL (ref 30.0–36.0)
MCV: 96.5 fL (ref 80.0–100.0)
Platelets: 212 10*3/uL (ref 150–400)
RBC: 4.55 MIL/uL (ref 3.87–5.11)
RDW: 12.9 % (ref 11.5–15.5)
WBC: 7 10*3/uL (ref 4.0–10.5)
nRBC: 0 % (ref 0.0–0.2)

## 2019-08-07 LAB — HIV ANTIBODY (ROUTINE TESTING W REFLEX): HIV Screen 4th Generation wRfx: NONREACTIVE

## 2019-08-07 LAB — MAGNESIUM: Magnesium: 1.9 mg/dL (ref 1.7–2.4)

## 2019-08-07 MED ORDER — OXYCODONE-ACETAMINOPHEN 7.5-325 MG PO TABS: 1.0000 | ORAL_TABLET | ORAL | Status: DC | PRN

## 2019-08-07 MED ORDER — SODIUM CHLORIDE 0.45 % IV SOLN
INTRAVENOUS | Status: DC
  Administered 2019-08-07: 03:00:00 via INTRAVENOUS

## 2019-08-07 MED ORDER — PHENAZOPYRIDINE HCL 100 MG PO TABS
100.0000 mg | ORAL_TABLET | Freq: Three times a day (TID) | ORAL | Status: DC
  Administered 2019-08-07 – 2019-08-10 (×9): 100 mg via ORAL
  Filled 2019-08-07 (×10): qty 1

## 2019-08-07 MED ORDER — LEVOTHYROXINE SODIUM 75 MCG PO TABS
75.0000 ug | ORAL_TABLET | Freq: Every day | ORAL | Status: DC
  Administered 2019-08-07 – 2019-08-14 (×8): 75 ug via ORAL
  Filled 2019-08-07 (×8): qty 1

## 2019-08-07 MED ORDER — ONDANSETRON HCL 4 MG/2ML IJ SOLN
4.0000 mg | Freq: Four times a day (QID) | INTRAMUSCULAR | Status: DC | PRN
  Administered 2019-08-07 – 2019-08-09 (×4): 4 mg via INTRAVENOUS
  Filled 2019-08-07 (×4): qty 2

## 2019-08-07 MED ORDER — DOCUSATE SODIUM 100 MG PO CAPS: 100.0000 mg | ORAL_CAPSULE | Freq: Two times a day (BID) | ORAL | Status: DC

## 2019-08-07 MED ORDER — MORPHINE SULFATE (PF) 2 MG/ML IV SOLN
2.0000 mg | INTRAVENOUS | Status: DC | PRN
  Administered 2019-08-07 – 2019-08-08 (×4): 2 mg via INTRAVENOUS
  Filled 2019-08-07 (×4): qty 1

## 2019-08-07 MED ORDER — SODIUM CHLORIDE 0.9 % IV SOLN
2.0000 g | INTRAVENOUS | Status: DC
  Administered 2019-08-07 – 2019-08-08 (×2): 2 g via INTRAVENOUS
  Filled 2019-08-07 (×2): qty 2

## 2019-08-07 MED ORDER — DIPHENHYDRAMINE HCL 25 MG PO CAPS
25.0000 mg | ORAL_CAPSULE | Freq: Four times a day (QID) | ORAL | Status: DC | PRN
  Administered 2019-08-07 – 2019-08-08 (×3): 25 mg via ORAL
  Filled 2019-08-07 (×4): qty 1

## 2019-08-07 MED ORDER — POTASSIUM CHLORIDE 10 MEQ/100ML IV SOLN
10.0000 meq | INTRAVENOUS | Status: AC
  Administered 2019-08-07 (×4): 10 meq via INTRAVENOUS
  Filled 2019-08-07 (×4): qty 100

## 2019-08-07 MED ORDER — KETOROLAC TROMETHAMINE 30 MG/ML IJ SOLN
30.0000 mg | Freq: Four times a day (QID) | INTRAMUSCULAR | Status: AC | PRN
  Administered 2019-08-07 – 2019-08-11 (×7): 30 mg via INTRAVENOUS
  Filled 2019-08-07 (×8): qty 1

## 2019-08-07 MED ORDER — ALBUTEROL SULFATE HFA 108 (90 BASE) MCG/ACT IN AERS: 2.0000 | INHALATION_SPRAY | Freq: Four times a day (QID) | RESPIRATORY_TRACT | Status: DC | PRN

## 2019-08-07 MED ORDER — ALBUTEROL SULFATE (2.5 MG/3ML) 0.083% IN NEBU: 2.5000 mg | INHALATION_SOLUTION | Freq: Four times a day (QID) | RESPIRATORY_TRACT | Status: DC | PRN

## 2019-08-07 MED ORDER — ENOXAPARIN SODIUM 60 MG/0.6ML ~~LOC~~ SOLN
60.0000 mg | SUBCUTANEOUS | Status: DC
  Administered 2019-08-07 – 2019-08-08 (×2): 60 mg via SUBCUTANEOUS
  Filled 2019-08-07 (×2): qty 0.6

## 2019-08-07 MED ORDER — LACTATED RINGERS IV SOLN
INTRAVENOUS | Status: DC
  Administered 2019-08-07 – 2019-08-11 (×8): via INTRAVENOUS

## 2019-08-07 MED ORDER — METRONIDAZOLE IN NACL 5-0.79 MG/ML-% IV SOLN
500.0000 mg | Freq: Two times a day (BID) | INTRAVENOUS | Status: DC
  Administered 2019-08-07 – 2019-08-08 (×4): 500 mg via INTRAVENOUS
  Filled 2019-08-07 (×4): qty 100

## 2019-08-07 MED ORDER — HYDROMORPHONE HCL 1 MG/ML IJ SOLN
1.0000 mg | Freq: Once | INTRAMUSCULAR | Status: AC
  Administered 2019-08-07: 1 mg via INTRAVENOUS
  Filled 2019-08-07: qty 1

## 2019-08-07 MED ORDER — IBUPROFEN 200 MG PO TABS: 600.0000 mg | ORAL_TABLET | Freq: Four times a day (QID) | ORAL | Status: DC | PRN

## 2019-08-07 MED ORDER — ACETAMINOPHEN 325 MG PO TABS
650.0000 mg | ORAL_TABLET | Freq: Four times a day (QID) | ORAL | Status: DC | PRN
  Administered 2019-08-08 – 2019-08-12 (×6): 650 mg via ORAL
  Filled 2019-08-07 (×6): qty 2

## 2019-08-07 NOTE — Plan of Care (Signed)
  Problem: Clinical Measurements: Goal: Will remain free from infection Outcome: Progressing   Problem: Clinical Measurements: Goal: Respiratory complications will improve Outcome: Progressing   Problem: Clinical Measurements: Goal: Cardiovascular complication will be avoided Outcome: Progressing   Problem: Nutrition: Goal: Adequate nutrition will be maintained Outcome: Progressing   Problem: Coping: Goal: Level of anxiety will decrease Outcome: Progressing   

## 2019-08-07 NOTE — Progress Notes (Signed)
Lovenox per Pharmacy for DVT Prophylaxis    Pharmacy has been consulted from dosing enoxaparin (lovenox) in this patient for DVT prophylaxis.  The pharmacist has reviewed pertinent labs (Hgb _15.3__; PLT_230__), patient weight (__154.2_kg) and renal function (CrCl_>90__mL/min) and decided that enoxaparin _60_mg SQ Q24Hrs is appropriate for this patient.  The pharmacy department will sign off at this time.  Please reconsult pharmacy if status changes or for further issues.  Thank you  Cyndia Diver PharmD, BCPS  08/07/2019, 3:19 AM

## 2019-08-07 NOTE — H&P (Signed)
History and Physical    Lori C Kirsten MWU:[id number] DOB: [birth year] DOA: 08/06/2019  PCP: Claiborne Rigg, NP (Confirm with patient/family/NH records and if not entered, this has to be entered at [facility] point of entry) Patient coming from: home  I have personally briefly reviewed patient's old medical records in [facility]  Chief Complaint: abdominal pain  HPI: Lori Mcknight is a 34 y.o. female with medical history significant of  lower abdominal pain.  Has been feeling poorly with abdominal pain since about 10/12.  At first he was having trouble urinating and having a bowel movement.  Went to urgent care and was diagnosed with cystitis.  She was also later found to have trichomonas and yeast and was prescribed Flagyl, Diflucan, and eventually doxycycline given continued abdominal pain.  She denies any vaginal bleeding or discharge.  The pain is midline lower abdomen and left side.  There is no new back pain.  She did have a low-grade temperature of about 100.2 yesterday and has had some episodes of vomiting over the last day or so.  Has dysuria.  ED Course: Hemodynamically stable. Lab reveals normal WBC. CT abdomen with diverticulitis at site of previous diverticulitis now with 4x3.8x4.5 fluid collection that may represent contained perforation vs abscess.  Review of Systems: As per HPI otherwise 10 point review of systems negative.    Past Medical History:  Diagnosis Date  . Anemia    history of anemia  . Anxiety   . Asthma    inhaler as needed  . Chest pain of uncertain etiology    intermittent left side chest pain  . Chlamydia   . Depression   . Endometriosis   . Genital herpes   . Headache    MIGRAINES  . Hypertension    no meds since April. needs new px per pt   . Hypothyroidism   . Morbid obesity (HCC)   . Obese   . Obesity, Class III, BMI 40-49.9 (morbid obesity) (HCC) 11/06/2011  . PCOS (polycystic ovarian syndrome)   . PONV (postoperative nausea and vomiting)   .  Shortness of breath    on exertion from weight  . Thyroid disease   . Urinary tract infection     Past Surgical History:  Procedure Laterality Date  . DILATION AND CURETTAGE OF UTERUS    . DILATION AND CURETTAGE OF UTERUS N/A 04/29/2018   Procedure: DILATATION AND CURETTAGE;  Surgeon: Allie Bossier, MD;  Location: [facility];  Service: Gynecology;  Laterality: N/A;  . HYSTEROSCOPY W/D&C N/A 04/06/2014   Procedure: DILATATION AND CURETTAGE /HYSTEROSCOPY ;  Surgeon: Tereso Newcomer, MD;  Location: [facility];  Service: Gynecology;  Laterality: N/A;  . LAPAROSCOPY N/A 04/29/2018   Procedure: LAPAROSCOPY DIAGNOSTIC WITH PERITONEAL BIOPSY;  Surgeon: Allie Bossier, MD;  Location: [facility];  Service: Gynecology;  Laterality: N/A;  . LEFT HEART CATH AND CORONARY ANGIOGRAPHY N/A 12/12/2016   Procedure: Left Heart Cath and Coronary Angiography;  Surgeon: Peter M Swaziland, MD;  Location: [facility] INVASIVE CV LAB;  Service: Cardiovascular;  Laterality: N/A;  . NO PAST SURGERIES    . 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 Has long standing SO. Currently living with sister but plans to move into her own apartment.    reports that she has been smoking. She has never used smokeless tobacco. She reports current alcohol use. She reports previous drug  use. Drug: Marijuana.  No Known Allergies  Family History  Problem Relation Age of Onset  . Heart disease Mother   . Sleep apnea Mother   . Diabetes Father   . Heart disease Father   . Heart disease Maternal Grandmother   . Diabetes Maternal Grandfather   . Heart disease Maternal Grandfather      Prior to Admission medications   Medication Sig Start Date End Date Taking? Authorizing Provider  albuterol (PROVENTIL HFA;VENTOLIN HFA) 108 (90 Base) MCG/ACT inhaler Inhale 2 puffs into the lungs every 6 (six) hours as needed for wheezing or shortness of breath. 01/19/18  Yes Claiborne RiggFleming, Zelda W, NP   doxycycline (VIBRAMYCIN) 100 MG capsule Take 1 capsule (100 mg total) by mouth 2 (two) times daily for 7 days. 08/02/19 08/09/19 Yes Bast, Traci A, NP  ibuprofen (ADVIL,MOTRIN) 600 MG tablet Take 1 tablet (600 mg total) by mouth every 6 (six) hours as needed. 04/29/18  Yes Dove, Myra C, MD  levothyroxine (SYNTHROID, LEVOTHROID) 75 MCG tablet Take 1 tablet (75 mcg total) by mouth daily before breakfast for 15 days. NEEDS TO MAKE LAB APPOINTMENT 12/13/18 08/06/19 Yes Claiborne RiggFleming, Zelda W, NP  metroNIDAZOLE (FLAGYL) 500 MG tablet Take 1 tablet (500 mg total) by mouth 2 (two) times daily for 7 days. 08/02/19 08/09/19 Yes Bast, Traci A, NP  cyclobenzaprine (FLEXERIL) 10 MG tablet Take 1 tablet (10 mg total) by mouth 2 (two) times daily as needed for muscle spasms. Patient not taking: Reported on 08/06/2019 11/17/18   Thressa ShellerHogan, Heather D, CNM  naproxen (NAPROSYN) 500 MG tablet Take 1 tablet (500 mg total) by mouth 2 (two) times daily. Patient not taking: Reported on 08/06/2019 11/17/18   Armando ReichertHogan, Heather D, CNM    Physical Exam: Vitals:   08/06/19 1812 08/06/19 1816 08/06/19 2311  BP: 135/84  (!) 159/93  Pulse: (!) 111  86  Resp: 18  16  Temp: 98.1 F (36.7 C)    TempSrc: Oral    SpO2: 100%  100%  Weight: (!) 154.2 kg (!) 154.2 kg   Height: 5\' 6"  (1.676 m)      Constitutional: NAD, calm, comfortable Vitals:   08/06/19 1812 08/06/19 1816 08/06/19 2311  BP: 135/84  (!) 159/93  Pulse: (!) 111  86  Resp: 18  16  Temp: 98.1 F (36.7 C)    TempSrc: Oral    SpO2: 100%  100%  Weight: (!) 154.2 kg (!) 154.2 kg   Height: 5\' 6"  (1.676 m)     General:  Morbidly obese, in pain with movement. Eyes: PERRL, lids and conjunctivae normal ENMT: Mucous membranes are moist. Posterior pharynx clear of any exudate or lesions.Normal dentition.  Neck: normal, supple, no masses, no thyromegaly Respiratory: clear to auscultation bilaterally, no wheezing, no crackles. Normal respiratory effort. No accessory muscle use.   Cardiovascular: Regular rate and rhythm, no murmurs / rubs / gallops. No extremity edema. 2+ pedal pulses. No carotid bruits.  Abdomen:  Obese, Bowel sounds positive. Very tender to light palpation LLQ Musculoskeletal: no clubbing / cyanosis. No joint deformity upper and lower extremities. Good ROM, no contractures. Normal muscle tone.  Skin: no rashes, lesions, ulcers. No induration Neurologic: CN 2-12 grossly intact. Sensation intact,  Strength 5/5 in all 4.  Psychiatric: Normal judgment and insight. Alert and oriented x 3. Normal mood.   (Anything < 9 systems with 2 bullets each down codes to level 1) (If patient refuses exam can't bill higher level) (Make sure to document  decubitus ulcers present on admission -- if possible -- and whether patient has chronic indwelling catheter at time of admission)  Labs on Admission: I have personally reviewed following labs and imaging studies  CBC: Recent Labs  Lab 08/06/19 1924  WBC 8.5  HGB 15.3*  HCT 47.9*  MCV 97.4  PLT 230   Basic Metabolic Panel: Recent Labs  Lab 08/06/19 1924  NA 138  K 3.4*  CL 104  CO2 25  GLUCOSE 120*  BUN 8  CREATININE 0.99  CALCIUM 9.2   GFR: Estimated Creatinine Clearance: 123 mL/min (by C-G formula based on SCr of 0.99 mg/dL). Liver Function Tests: Recent Labs  Lab 08/06/19 1924  AST 33  ALT 27  ALKPHOS 84  BILITOT 0.7  PROT 8.8*  ALBUMIN 4.4   Recent Labs  Lab 08/06/19 1924  LIPASE 23   No results for input(s): AMMONIA in the last 168 hours. Coagulation Profile: No results for input(s): INR, PROTIME in the last 168 hours. Cardiac Enzymes: No results for input(s): CKTOTAL, CKMB, CKMBINDEX, TROPONINI in the last 168 hours. BNP (last 3 results) No results for input(s): PROBNP in the last 8760 hours. HbA1C: No results for input(s): HGBA1C in the last 72 hours. CBG: No results for input(s): GLUCAP in the last 168 hours. Lipid Profile: No results for input(s): CHOL, HDL, LDLCALC,  TRIG, CHOLHDL, LDLDIRECT in the last 72 hours. Thyroid Function Tests: No results for input(s): TSH, T4TOTAL, FREET4, T3FREE, THYROIDAB in the last 72 hours. Anemia Panel: No results for input(s): VITAMINB12, FOLATE, FERRITIN, TIBC, IRON, RETICCTPCT in the last 72 hours. Urine analysis:    Component Value Date/Time   COLORURINE AMBER (A) 08/06/2019 2038   APPEARANCEUR CLEAR 08/06/2019 2038   LABSPEC 1.018 08/06/2019 2038   PHURINE 6.0 08/06/2019 2038   GLUCOSEU NEGATIVE 08/06/2019 2038   HGBUR NEGATIVE 08/06/2019 2038   BILIRUBINUR NEGATIVE 08/06/2019 2038   BILIRUBINUR negative 08/16/2018 1123   KETONESUR NEGATIVE 08/06/2019 2038   PROTEINUR 30 (A) 08/06/2019 2038   UROBILINOGEN 0.2 07/29/2019 1321   NITRITE NEGATIVE 08/06/2019 2038   LEUKOCYTESUR SMALL (A) 08/06/2019 2038    Radiological Exams on Admission: Ct Abdomen Pelvis W Contrast  Result Date: 08/06/2019 CLINICAL DATA:  Abdominal pain, diverticulitis suspected, UTI for over a week, left-sided abdominal pain EXAM: CT ABDOMEN AND PELVIS WITH CONTRAST TECHNIQUE: Multidetector CT imaging of the abdomen and pelvis was performed using the standard protocol following bolus administration of intravenous contrast. CONTRAST:  OMNIPAQUE IOHEXOL 300 MG/ML  SOLN COMPARISON:  CT abdomen pelvis 09/24/2018 FINDINGS: Lower chest: Atelectatic changes in the left lung base. Lung bases are otherwise clear. Normal heart size. No pericardial effusion. Hepatobiliary: No focal liver abnormality is seen. No gallstones, gallbladder wall thickening, or biliary dilatation. Pancreas: Unremarkable. No pancreatic ductal dilatation or surrounding inflammatory changes. Spleen: Normal in size without focal abnormality. Adrenals/Urinary Tract: Adrenal glands are unremarkable. Kidneys are normal, without renal calculi, focal lesion, or hydronephrosis. Urinary bladder is largely decompressed at the time exam. There is however asymmetric mural thickening and  hypoattenuation within the bladder wall and loss of clear lead discernible fat plane between the bladder and associated rim enhancing collection between the bladder and proximal sigmoid colon. Stomach/Bowel: Distal esophagus, stomach and duodenal sweep are unremarkable. No small bowel wall thickening or dilatation. No evidence of obstruction. A normal appendix is visualized. Proximal colon is unremarkable. There is segmental thickening of the sigmoid colon centered upon a region of numerous colonic diverticula and an area of  inflammation which was present on comparison exam from 09/24/2018. There is now a rim enhancing collection extending from the wall with surrounding phlegmonous change in reactive adenopathy as well as some adjacent low-attenuation free fluid as well. No resulting obstruction. No extraluminal gas. Vascular/Lymphatic: The aorta is normal caliber. Reactive adenopathy in the low pelvis in the region of phlegmonous change. Reproductive: Anteverted uterus. The left ovary is in close proximity to the region of inflammation in the left lower quadrant, positioned posteriorly as evidence by the course of the left gonadal vein. There is loss of discernible fat plane of the anterior wall of left ovary. The right ovary is unremarkable. Other: Free fluid and phlegmonous change in the left lower quadrant and deep pelvis with a rim enhancing collection measuring approximately 4 x 3.8 x 4.5 cm in size concerning for a contained perforation or abscess formation. Loss of discernible fat planes and reactive stranding of the bladder could raise suspicion for possible colovesicular fistulization. No other fluid collections are evident. No free air. No bowel containing hernias. Musculoskeletal: No acute osseous abnormality or suspicious osseous lesion. Degenerative changes in the spine are maximal at L5-S1. Additional vacuum phenomenon and early degenerative sclerosis noted at the SI joints. IMPRESSION: 1. Acute  diverticulitis of the sigmoid colon centered upon a region of inflammation which was present on comparison exam from 09/24/2018. There is now a 4 x 3.8 x 4.5 cm rim enhancing collection extending from the colonic wall with surrounding phlegmonous change in the left lower quadrant and deep pelvis, concerning for a contained perforation or abscess formation. Loss of discernible fat planes and reactive bladder wall thickening and perivesicular stranding/features of cystitis raise suspicion for possible colovesicular fistulization. Such a finding could be further evaluated with a limited CT of the pelvis following rectal contrast administration. 2. Additional stranding and hyperemia of the left ovary which is in close proximity to the rim enhancing collection. Finding is nonspecific though a colo-ovarian fistula is not fully excluded. Electronically Signed   By: Lovena Le M.D.   On: 08/06/2019 23:34    EKG: Independently reviewed. No EKG done  Assessment/Plan Active Problems:   Diverticulitis of intestine with abscess   Essential hypertension   Hypothyroid  (please populate well all problems here in Problem List. (For example, if patient is on BP meds at home and you resume or decide to hold them, it is a problem that needs to be her. Same for CAD, COPD, HLD and so on)   1. Diverticulitis - patient had bout of diverticulitis Dec 2019 resolved with outpatient treatment. Now with recurrent abdominal pain. CT reveals recurrent diverticulitis with fluid collection representing contained perforation vs abscess. Also question of vesiculocolonic fistula. Plan Med-surg admit  Zosyn for abx coverage  CT pelvis with rectal contrast to r/o fistula  NPO while eval completed  May need surgical consult vs IR for drain placement pending CT pelvis results  2. Trichomonas - patient started 10/17//20 on Flagyl for trich. Plan Continue BID flagyl but will give IV  3. Hypothyroidism - continue home meds.  4. HTN  - continue home meds  DVT prophylaxis: lovenox (Lovenox/Heparin/SCD's/anticoagulated/None (if comfort care) Code Status: full code (Full/Partial (specify details) Family Communication: spoke with patient's sister (Specify name, relationship. Do not write "discussed with patient". Specify tel # if discussed over the phone) Disposition Plan: home 3-4 days (specify when and where you expect patient to be discharged) Consults called: none (with names) Admission status: inpatient (inpatient / obs / tele /  medical floor / SDU)   Illene Regulus MD [facility] Pager [phone]  If 7PM-7AM, please contact night-coverage [email or web address] Password [email or web address]  08/07/2019, 12:22 AM

## 2019-08-07 NOTE — ED Notes (Addendum)
Pt complaining of mild generalized itching. Pt states she has had a similar reaction after taking percocet's in the past. IV sight assessed, no signs of redness, swelling or irritation. So signs of airway compromise. Provider informed.

## 2019-08-07 NOTE — Progress Notes (Signed)
[facility] Progress Note  Patient: Lori Mcknight ZOX:[id number]   PCP: Claiborne RiggFleming, Zelda W, NP DOB: 1986/07/22   DOA: 08/06/2019   DOS: 08/07/2019   Date of Service: the patient was seen and examined on 08/07/2019  Chief Complaint  Patient presents with  . Urinary Tract Infection   Brief hospital course: Pt. with PMH of depression, asthma, anxiety, BMI more than 40, PCOS, hypothyroidism, HTN not on meds; presented with complain of abdominal pain, was found to have diverticulitis with microperforation phlegmon.  Currently further plan is continue IV antibiotics.  Subjective: Patient reports lower abdominal pain.  No nausea no vomiting.  Also has diarrhea without any blood.  Also reports burning urination without any bleeding.  No chest pain or shortness of breath no cough.  Assessment and Plan: 1.  Diverticulitis with microperforation and phlegmon CT abdomen pelvis shows evidence of rim-enhancing collection measuring approximately 4 x 4 x 4 cm in size.  Concerning for contained perforation or abscess formation. Evidence of diverticulitis in the sigmoid colon. There is also concern for colovesicular fistulization. Patient currently on IV Flagyl, I will add IV ceftriaxone. I will change her IV fluids as well. I will add pain control with IV morphine. Patient will remain n.p.o. except medication. We will consult general surgery for further assistance after CT pelvis. Discontinue p.o. medications. May require IR drainage of the abscess.  2.  Trichomonas. Patient is on IV Flagyl for now. Started on 07/30/2019.  3.  Hypothyroidism. We will continue Synthroid.  4. Body mass index is 54.87 kg/m.  May benefit from dietary consultation and education.  Diet: NPO DVT Prophylaxis: Subcutaneous Lovenox  Advance goals of care discussion: Full code  Family Communication: no family was present at bedside, at the time of interview.   Disposition:  Discharge to be determined .   Consultants: none Procedures: none  Scheduled Meds: . enoxaparin (LOVENOX) injection  60 mg Subcutaneous Q24H  . levothyroxine  75 mcg Oral Q0600  . phenazopyridine  100 mg Oral TID WC   Continuous Infusions: . cefTRIAXone (ROCEPHIN)  IV 2 g (08/07/19 1014)  . lactated ringers 75 mL/hr at 08/07/19 1250  . metronidazole 100 mL/hr at 08/07/19 0938  . potassium chloride 10 mEq (08/07/19 1706)   PRN Meds: acetaminophen, albuterol, diphenhydrAMINE, ketorolac, morphine injection, ondansetron (ZOFRAN) IV Antibiotics: Anti-infectives (From admission, onward)   Start     Dose/Rate Route Frequency Ordered Stop   08/07/19 1000  cefTRIAXone (ROCEPHIN) 2 g in sodium chloride 0.9 % 100 mL IVPB     2 g 200 mL/hr over 30 Minutes Intravenous Every 24 hours 08/07/19 0751     08/07/19 0100  metroNIDAZOLE (FLAGYL) IVPB 500 mg     500 mg 100 mL/hr over 60 Minutes Intravenous 2 times daily 08/07/19 0042     08/07/19 0000  piperacillin-tazobactam (ZOSYN) IVPB 3.375 g     3.375 g 100 mL/hr over 30 Minutes Intravenous  Once 08/06/19 2347 08/07/19 0200       Objective: Physical Exam: Vitals:   08/07/19 0042 08/07/19 0246 08/07/19 0309 08/07/19 1413  BP: (!) 157/95 (!) 150/90 128/79 (!) 153/83  Pulse: 70 70 67 68  Resp: 14 15 20 15   Temp:  98.3 F (36.8 C) 98.2 F (36.8 C) 97.8 F (36.6 C)  TempSrc:  Oral  Oral  SpO2: 100% 100% 98% 95%  Weight:      Height:        Intake/Output Summary (Last 24 hours) at 08/07/2019 1718  Last data filed at 08/07/2019 1414 Gross per 24 hour  Intake 785.39 ml  Output 0 ml  Net 785.39 ml   Filed Weights   08/06/19 1812 08/06/19 1816  Weight: (!) 154.2 kg (!) 154.2 kg   General: alert and oriented to time, place, and person. Appear in mild distress, affect appropriate Eyes: PERRL, Conjunctiva normal ENT: Oral Mucosa Clear, moist  Neck: no JVD, no Abnormal Mass Or lumps Cardiovascular: S1 and S2 Present, no Murmur, peripheral pulses symmetrical  Respiratory: good respiratory effort, Bilateral Air entry equal and Decreased, no signs of accessory muscle use, Clear to Auscultation, no Crackles, no wheezes Abdomen: Bowel Sound present, Soft and mild tenderness, no hernia Skin: no rashes  Extremities: no Pedal edema, no calf tenderness Neurologic: without any new focal findings Gait not checked due to patient safety concerns  Data Reviewed: I have personally reviewed and interpreted daily labs, tele strips, imagings as discussed above. I reviewed all nursing notes, pharmacy notes, vitals, pertinent old records I have discussed plan of care as described above with RN and patient/family.  CBC: Recent Labs  Lab 08/06/19 1924 08/07/19 0816  WBC 8.5 7.0  HGB 15.3* 14.1  HCT 47.9* 43.9  MCV 97.4 96.5  PLT 230 440   Basic Metabolic Panel: Recent Labs  Lab 08/06/19 1924 08/07/19 0816  NA 138 138  K 3.4* 3.3*  CL 104 107  CO2 25 23  GLUCOSE 120* 97  BUN 8 7  CREATININE 0.99 0.76  CALCIUM 9.2 8.7*  MG  --  1.9    Liver Function Tests: Recent Labs  Lab 08/06/19 1924  AST 33  ALT 27  ALKPHOS 84  BILITOT 0.7  PROT 8.8*  ALBUMIN 4.4   Recent Labs  Lab 08/06/19 1924  LIPASE 23   No results for input(s): AMMONIA in the last 168 hours. Coagulation Profile: No results for input(s): INR, PROTIME in the last 168 hours. Cardiac Enzymes: No results for input(s): CKTOTAL, CKMB, CKMBINDEX, TROPONINI in the last 168 hours. BNP (last 3 results) No results for input(s): PROBNP in the last 8760 hours. CBG: No results for input(s): GLUCAP in the last 168 hours. Studies: Ct Abdomen Pelvis W Contrast  Result Date: 08/06/2019 CLINICAL DATA:  Abdominal pain, diverticulitis suspected, UTI for over a week, left-sided abdominal pain EXAM: CT ABDOMEN AND PELVIS WITH CONTRAST TECHNIQUE: Multidetector CT imaging of the abdomen and pelvis was performed using the standard protocol following bolus administration of intravenous  contrast. CONTRAST:  151mL OMNIPAQUE IOHEXOL 300 MG/ML  SOLN COMPARISON:  CT abdomen pelvis 09/24/2018 FINDINGS: Lower chest: Atelectatic changes in the left lung base. Lung bases are otherwise clear. Normal heart size. No pericardial effusion. Hepatobiliary: No focal liver abnormality is seen. No gallstones, gallbladder wall thickening, or biliary dilatation. Pancreas: Unremarkable. No pancreatic ductal dilatation or surrounding inflammatory changes. Spleen: Normal in size without focal abnormality. Adrenals/Urinary Tract: Adrenal glands are unremarkable. Kidneys are normal, without renal calculi, focal lesion, or hydronephrosis. Urinary bladder is largely decompressed at the time exam. There is however asymmetric mural thickening and hypoattenuation within the bladder wall and loss of clear lead discernible fat plane between the bladder and associated rim enhancing collection between the bladder and proximal sigmoid colon. Stomach/Bowel: Distal esophagus, stomach and duodenal sweep are unremarkable. No small bowel wall thickening or dilatation. No evidence of obstruction. A normal appendix is visualized. Proximal colon is unremarkable. There is segmental thickening of the sigmoid colon centered upon a region of numerous colonic diverticula and  an area of inflammation which was present on comparison exam from 09/24/2018. There is now a rim enhancing collection extending from the wall with surrounding phlegmonous change in reactive adenopathy as well as some adjacent low-attenuation free fluid as well. No resulting obstruction. No extraluminal gas. Vascular/Lymphatic: The aorta is normal caliber. Reactive adenopathy in the low pelvis in the region of phlegmonous change. Reproductive: Anteverted uterus. The left ovary is in close proximity to the region of inflammation in the left lower quadrant, positioned posteriorly as evidence by the course of the left gonadal vein. There is loss of discernible fat plane of the  anterior wall of left ovary. The right ovary is unremarkable. Other: Free fluid and phlegmonous change in the left lower quadrant and deep pelvis with a rim enhancing collection measuring approximately 4 x 3.8 x 4.5 cm in size concerning for a contained perforation or abscess formation. Loss of discernible fat planes and reactive stranding of the bladder could raise suspicion for possible colovesicular fistulization. No other fluid collections are evident. No free air. No bowel containing hernias. Musculoskeletal: No acute osseous abnormality or suspicious osseous lesion. Degenerative changes in the spine are maximal at L5-S1. Additional vacuum phenomenon and early degenerative sclerosis noted at the SI joints. IMPRESSION: 1. Acute diverticulitis of the sigmoid colon centered upon a region of inflammation which was present on comparison exam from 09/24/2018. There is now a 4 x 3.8 x 4.5 cm rim enhancing collection extending from the colonic wall with surrounding phlegmonous change in the left lower quadrant and deep pelvis, concerning for a contained perforation or abscess formation. Loss of discernible fat planes and reactive bladder wall thickening and perivesicular stranding/features of cystitis raise suspicion for possible colovesicular fistulization. Such a finding could be further evaluated with a limited CT of the pelvis following rectal contrast administration. 2. Additional stranding and hyperemia of the left ovary which is in close proximity to the rim enhancing collection. Finding is nonspecific though a colo-ovarian fistula is not fully excluded. Electronically Signed   By: Kreg Shropshire M.D.   On: 08/06/2019 23:34     Time spent: 35 minutes  Author: Lynden Oxford, MD [facility] 08/07/2019 5:18 PM  To reach On-call, see care teams to locate the attending and reach out to them via [email or web address]. If 7PM-7AM, please contact night-coverage If you still have difficulty reaching the attending  provider, please page the [facility] (Director on Call) for [facility] on amion for assistance.

## 2019-08-08 DIAGNOSIS — R300 Dysuria: Secondary | ICD-10-CM

## 2019-08-08 DIAGNOSIS — Z800 Family history of malignant neoplasm of digestive organs: Secondary | ICD-10-CM

## 2019-08-08 DIAGNOSIS — N809 Endometriosis, unspecified: Secondary | ICD-10-CM

## 2019-08-08 LAB — CBC
HCT: 42.5 % (ref 36.0–46.0)
Hemoglobin: 13.6 g/dL (ref 12.0–15.0)
MCH: 31.3 pg (ref 26.0–34.0)
MCHC: 32 g/dL (ref 30.0–36.0)
MCV: 97.7 fL (ref 80.0–100.0)
Platelets: 194 K/uL (ref 150–400)
RBC: 4.35 MIL/uL (ref 3.87–5.11)
RDW: 12.6 % (ref 11.5–15.5)
WBC: 6.6 K/uL (ref 4.0–10.5)
nRBC: 0 % (ref 0.0–0.2)

## 2019-08-08 LAB — BASIC METABOLIC PANEL WITH GFR
Anion gap: 8 (ref 5–15)
BUN: 6 mg/dL (ref 6–20)
CO2: 23 mmol/L (ref 22–32)
Calcium: 8.6 mg/dL — ABNORMAL LOW (ref 8.9–10.3)
Chloride: 106 mmol/L (ref 98–111)
Creatinine, Ser: 0.72 mg/dL (ref 0.44–1.00)
GFR calc Af Amer: >60 mL/min
GFR calc non Af Amer: >60 mL/min
Glucose, Bld: 82 mg/dL (ref 70–99)
Potassium: 3.4 mmol/L — ABNORMAL LOW (ref 3.5–5.1)
Sodium: 137 mmol/L (ref 135–145)

## 2019-08-08 LAB — MAGNESIUM: Magnesium: 1.8 mg/dL (ref 1.7–2.4)

## 2019-08-08 MED ORDER — ENOXAPARIN SODIUM 60 MG/0.6ML ~~LOC~~ SOLN: 60.0000 mg | SUBCUTANEOUS | Status: DC

## 2019-08-08 MED ORDER — PIPERACILLIN-TAZOBACTAM 3.375 G IVPB
3.3750 g | Freq: Three times a day (TID) | INTRAVENOUS | Status: DC
  Administered 2019-08-08 – 2019-08-13 (×14): 3.375 g via INTRAVENOUS
  Filled 2019-08-08 (×15): qty 50

## 2019-08-08 MED ORDER — ENOXAPARIN SODIUM 60 MG/0.6ML ~~LOC~~ SOLN
60.0000 mg | SUBCUTANEOUS | Status: DC
  Administered 2019-08-10: 60 mg via SUBCUTANEOUS
  Filled 2019-08-08 (×2): qty 0.6

## 2019-08-08 NOTE — Progress Notes (Signed)
[facility] Progress Note  Patient: Lori Mcknight ZOX:[id number]   PCP: [name], NP DOB: Jan 27, 1986   DOA: 08/06/2019   DOS: 08/08/2019   Date of Service: the patient was seen and examined on 08/08/2019  Chief Complaint  Patient presents with  . Urinary Tract Infection   Brief hospital course: Pt. with PMH of depression, asthma, anxiety, BMI more than 40, PCOS, hypothyroidism, HTN not on meds; presented with complain of abdominal pain, was found to have diverticulitis with microperforation phlegmon.  Currently further plan is continue IV antibiotics.  Subjective: Continues to have abdominal pain.  No burning urination.  No nausea no vomiting.  No fever no chills.  Assessment and Plan: 1.  Diverticulitis with microperforation and phlegmon CT abdomen pelvis shows evidence of rim-enhancing collection measuring approximately 4 x 4 x 4 cm in size.  Concerning for contained perforation or abscess formation. Evidence of diverticulitis in the sigmoid colon. There is also concern for colovesicular fistulization. Initially patient was on only IV Flagyl.  Ceftriaxone was added.  After discussion with surgery transitioning to IV Zosyn. IR consulted for intra-abdominal drain placement versus drainage of the abscess. Currently remains n.p.o. Pain control. No indication for CT pelvis for now given her acute diverticulitis. Will benefit from outpatient colonoscopy for surgery. Appreciate their assistance.  2.  Trichomonas. Patient is on IV Flagyl for now. Started on 07/30/2019.  3.  Hypothyroidism. We will continue Synthroid.  4. Body mass index is 54.87 kg/m.  May benefit from dietary consultation and education.  Diet: NPO DVT Prophylaxis: Subcutaneous Lovenox  Advance goals of care discussion: Full code  Family Communication: no family was present at bedside, at the time of interview.   Disposition:  Discharge to be determined .  Consultants: none Procedures: none   Scheduled Meds: . enoxaparin (LOVENOX) injection  60 mg Subcutaneous Q24H  . levothyroxine  75 mcg Oral Q0600  . phenazopyridine  100 mg Oral TID WC   Continuous Infusions: . lactated ringers 75 mL/hr at 08/08/19 0600  . piperacillin-tazobactam (ZOSYN)  IV 3.375 g (08/08/19 1740)   PRN Meds: acetaminophen, albuterol, diphenhydrAMINE, ketorolac, morphine injection, ondansetron (ZOFRAN) IV Antibiotics: Anti-infectives (From admission, onward)   Start     Dose/Rate Route Frequency Ordered Stop   08/08/19 1800  piperacillin-tazobactam (ZOSYN) IVPB 3.375 g     3.375 g 12.5 mL/hr over 240 Minutes Intravenous Every 8 hours 08/08/19 1625     08/07/19 1000  cefTRIAXone (ROCEPHIN) 2 g in sodium chloride 0.9 % 100 mL IVPB  Status:  Discontinued     2 g 200 mL/hr over 30 Minutes Intravenous Every 24 hours 08/07/19 0751 08/08/19 1625   08/07/19 0100  metroNIDAZOLE (FLAGYL) IVPB 500 mg  Status:  Discontinued     500 mg 100 mL/hr over 60 Minutes Intravenous 2 times daily 08/07/19 0042 08/08/19 1625   08/07/19 0000  piperacillin-tazobactam (ZOSYN) IVPB 3.375 g     3.375 g 100 mL/hr over 30 Minutes Intravenous  Once 08/06/19 2347 08/07/19 0200       Objective: Physical Exam: Vitals:   08/07/19 1413 08/07/19 2109 08/08/19 0556 08/08/19 1417  BP: (!) 153/83 114/79 138/77 (!) 148/91  Pulse: 68 63 76 71  Resp: 15 18 16 17   Temp: 97.8 F (36.6 C) 97.6 F (36.4 C) 98.1 F (36.7 C) 98.6 F (37 C)  TempSrc: Oral   Oral  SpO2: 95% 100% 99% 98%  Weight:      Height:  Intake/Output Summary (Last 24 hours) at 08/08/2019 1957 Last data filed at 08/08/2019 0600 Gross per 24 hour  Intake 859.51 ml  Output -  Net 859.51 ml   Filed Weights   08/06/19 1812 08/06/19 1816  Weight: (!) 154.2 kg (!) 154.2 kg   General: alert and oriented to time, place, and person. Appear in mild distress, affect appropriate Eyes: PERRL, Conjunctiva normal ENT: Oral Mucosa Clear, moist  Neck: no JVD, no  Abnormal Mass Or lumps Cardiovascular: S1 and S2 Present, no Murmur, peripheral pulses symmetrical Respiratory: good respiratory effort, Bilateral Air entry equal and Decreased, no signs of accessory muscle use, Clear to Auscultation, no Crackles, no wheezes Abdomen: Bowel Sound present, Soft and mild tenderness, no hernia Skin: no rashes  Extremities: no Pedal edema, no calf tenderness Neurologic: without any new focal findings Gait not checked due to patient safety concerns  Data Reviewed: I have personally reviewed and interpreted daily labs, tele strips, imagings as discussed above. I reviewed all nursing notes, pharmacy notes, vitals, pertinent old records I have discussed plan of care as described above with RN and patient/family.  CBC: Recent Labs  Lab 08/06/19 1924 08/07/19 0816 08/08/19 0251  WBC 8.5 7.0 6.6  HGB 15.3* 14.1 13.6  HCT 47.9* 43.9 42.5  MCV 97.4 96.5 97.7  PLT 230 212 194   Basic Metabolic Panel: Recent Labs  Lab 08/06/19 1924 08/07/19 0816 08/08/19 0251  NA 138 138 137  K 3.4* 3.3* 3.4*  CL 104 107 106  CO2 25 23 23   GLUCOSE 120* 97 82  BUN 8 7 6   CREATININE 0.99 0.76 0.72  CALCIUM 9.2 8.7* 8.6*  MG  --  1.9 1.8    Liver Function Tests: Recent Labs  Lab 08/06/19 1924  AST 33  ALT 27  ALKPHOS 84  BILITOT 0.7  PROT 8.8*  ALBUMIN 4.4   Recent Labs  Lab 08/06/19 1924  LIPASE 23   No results for input(s): AMMONIA in the last 168 hours. Coagulation Profile: No results for input(s): INR, PROTIME in the last 168 hours. Cardiac Enzymes: No results for input(s): CKTOTAL, CKMB, CKMBINDEX, TROPONINI in the last 168 hours. BNP (last 3 results) No results for input(s): PROBNP in the last 8760 hours. CBG: No results for input(s): GLUCAP in the last 168 hours. Studies: No results found.   Time spent: 35 minutes  Author: 08/08/19, MD [facility] 08/08/2019 7:57 PM  To reach On-call, see care teams to locate the attending  and reach out to them via [email or web address]. If 7PM-7AM, please contact night-coverage If you still have difficulty reaching the attending provider, please page the [facility] (Director on Call) for [facility] on amion for assistance.

## 2019-08-08 NOTE — Progress Notes (Signed)
PHARMACY NOTE -  [place] has been assisting with dosing of Zosyn for IAI.  Dosage remains stable at 3.375 g IV q8 hr and need for further dosage adjustment appears unlikely at present given stable renal function at baseline  Pharmacy will sign off, following peripherally for culture results or dose adjustments. Please reconsult if a change in clinical status warrants re-evaluation of dosage.  Reuel Boom, PharmD, BCPS [phone] 08/08/2019, 4:38 PM

## 2019-08-08 NOTE — Consult Note (Signed)
Lori Mcknight  Aug 26, 1986 [id number]  CARE TEAM:  PCP: Claiborne Rigg, NP  Outpatient Care Team: Patient Care Team: Claiborne Rigg, NP as PCP - General (Nurse Practitioner)  Inpatient Treatment Team: Treatment Team: Attending Provider: Rolly Salter, MD; Consulting Physician: Rolly Salter, MD; Rounding Team: Merlyn Albert, MD; Technician: Birdie Hopes, [place]; Registered Nurse: Jackelyn Knife, RN; Registered Nurse: Alfredo Batty, RN; Social Worker: Elisabeth Pigeon; Consulting Physician: Montez Morita, Md, MD   This patient is a 33 y.o.female who presents today for surgical evaluation at the request of Dr Lynden Oxford.   Chief complaint / Reason for evaluation: Pelvic abscess most likely diverticular  Pleasant morbidly obese female.  History of left lower quadrant pain and diagnosed with diverticulitis December 2019.  Treated with oral antibiotics for emergency department.  Seemed to improve.  Another precedent of crampy pain in Feb 2020.  Treated with nonsteroidals for probable endometrosis flare.  Patient had pain earlier this month 10/16 ED visit.  A lot of urinary symptoms.  Presumed to be UTI.   Placed on oral antibiotics.  Gradually worsened.  Returned a week later.  Pain left lower quadrant and suprapubic regions.   Some nausea but no emesis.  Worsening dysuria.  Foul odor concerning for urinary tract infection.  Urinalysis/urine culture negative so far.  CT scan concerning for diverticulitis with abscess.  Admitted antibiotics.  Because of some persistent pain surgical consultation requested this evening to help follow.    Patient notes she feels better since being admitted.  She recalls her father being diagnosed with colon cancer.  He passed away from myocardial infarction at age 67.  She is pretty sure her mother had at least polyps.  No other family members that she is aware of.  She has never had a colonoscopy.  She tends to be constipated, moving her bowels  about twice a week.  She has had more loose bowel movements on the antibiotics for the past few weeks.  She does have pain and discomfort.  She had a few days where is very hard to urinate.  She has never had pneumaturia or feculent urine output.  No history of Crohn's or colitis that she is aware of.  She normally can walk 20 minutes without difficulty.  She rarely smokes cigarettes.  Is not a diabetic.  She did have laparoscopic endometrial ablation by Dr. Marice Potter 04/2018, no other surgeries.  Assessment  Lori Mcknight  33 y.o. 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  Because the abscess is greater than 4 cm, see if interventional radiology can at least aspirate if not drain to help get him under better control.  She does have some urinary symptoms but no hard indication of colovesical fistula.  Obviously high risk based on the location and symptoms.  Believe with drainage antibiotics will get under control.  If develops a true fistula, this will require surgical repair at the time of robotic sigmoid colectomy  I believe she would benefit for outpatient  follow-up with probable colonoscopy in 6 to 8 weeks to rule out cancer given her family history of her father having cancer less than 98 years old.  Rule out any colon stricturing.  Because this is her second attack documented in the past 12 months, this 1 is now complicated with an abscess and she is only in her 30s; she could benefit from elective surgical resection to break the cycle of attacks.  At the very least have colonoscopy after  this flare is under control and have a surgical discussion about this.  She is leaning towards this.  Right now there is no hard evidence that she needs any emergent surgery requiring a Hartmann resection.  We will follow with the medicine service.  -VTE prophylaxis- SCDs, etc -mobilize as tolerated to help recovery  35 minutes spent in review, evaluation, examination, counseling, and coordination of care.  More than 50% of that time was spent in counseling.  Ardeth Sportsman, MD, FACS, MASCRS Gastrointestinal and Minimally Invasive Surgery  [facility] [address] [place], [place] [address] [phone] Main / Paging [phone] Fax     08/08/2019      Past Medical History:  Diagnosis Date  . Anemia    history of anemia  . Anxiety   . Asthma    inhaler as needed  . Chest pain of uncertain etiology    intermittent left side chest pain  . Chlamydia   . Depression   . Endometriosis   . Genital herpes   . Headache    MIGRAINES  . Hypertension    no meds since April. needs new px per pt   . Hypothyroidism   . Morbid obesity (HCC)   . Obese   . Obesity, Class III, BMI 40-49.9 (morbid obesity) (HCC) 11/06/2011  . PCOS (polycystic ovarian syndrome)   . PONV (postoperative nausea and vomiting)   . Shortness of breath    on exertion from weight  . Thyroid disease   . Urinary tract infection     Past Surgical History:  Procedure Laterality Date  . DILATION AND CURETTAGE OF UTERUS    . DILATION AND CURETTAGE OF UTERUS N/A 04/29/2018   Procedure: DILATATION AND CURETTAGE;  Surgeon: Allie Bossier, MD;  Location: [facility];  Service: Gynecology;  Laterality: N/A;  . HYSTEROSCOPY W/D&C N/A 04/06/2014   Procedure: DILATATION AND CURETTAGE /HYSTEROSCOPY ;  Surgeon: Tereso Newcomer, MD;  Location: [facility];  Service: Gynecology;  Laterality: N/A;  . LAPAROSCOPY N/A 04/29/2018   Procedure: LAPAROSCOPY DIAGNOSTIC WITH PERITONEAL BIOPSY;  Surgeon:  Allie Bossier, MD;  Location: [facility];  Service: Gynecology;  Laterality: N/A;  . LEFT HEART CATH AND CORONARY ANGIOGRAPHY N/A 12/12/2016   Procedure: Left Heart Cath and Coronary Angiography;  Surgeon: Peter M Swaziland, MD;  Location: [facility] INVASIVE CV LAB;  Service: Cardiovascular;  Laterality: N/A;  . NO PAST SURGERIES    . WISDOM TOOTH EXTRACTION      Social History   Socioeconomic History  . Marital status: Single    Spouse name: Not on file  . Number of children: Not on file  . Years of education: Not on file  . Highest education level: Not on file  Occupational History  . Not on file  Social Needs  . Financial resource strain: Not on file  . Food insecurity    Worry: Not on file    Inability: Not on file  . Transportation needs  Medical: Not on file    Non-medical: Not on file  Tobacco Use  . Smoking status: Current Some Day Smoker  . Smokeless tobacco: Never Used  . Tobacco comment: 2-3 cigerettes a week.  Substance and Sexual Activity  . Alcohol use: Yes    Alcohol/week: 0.0 standard drinks    Comment: occasonal   . Drug use: Not Currently    Types: Marijuana  . Sexual activity: Not Currently    Birth control/protection: None  Lifestyle  . Physical activity    Days per week: Not on file    Minutes per session: Not on file  . Stress: Not on file  Relationships  . Social Musician on phone: Not on file    Gets together: Not on file    Attends religious service: Not on file    Active member of club or organization: Not on file    Attends meetings of clubs or organizations: Not on file    Relationship status: Not on file  . Intimate partner violence    Fear of current or ex partner: Not on file    Emotionally abused: Not on file    Physically abused: Not on file    Forced sexual activity: Not on file  Other Topics Concern  . Not on file  Social History Narrative  . Not on file    Family History  Problem Relation Age of Onset  . Heart disease  Mother   . Sleep apnea Mother   . Diabetes Father   . Heart disease Father   . Heart disease Maternal Grandmother   . Diabetes Maternal Grandfather   . Heart disease Maternal Grandfather     Current Facility-Administered Medications  Medication Dose Route Frequency Provider Last Rate Last Dose  . acetaminophen (TYLENOL) tablet 650 mg  650 mg Oral Q6H PRN Rolly Salter, MD   650 mg at 08/08/19 1616  . albuterol (PROVENTIL) (2.5 MG/3ML) 0.083% nebulizer solution 2.5 mg  2.5 mg Nebulization Q6H PRN Norins, Rosalyn Gess, MD      . diphenhydrAMINE (BENADRYL) capsule 25 mg  25 mg Oral Q6H PRN Jacques Navy, MD   25 mg at 08/07/19 2103  . enoxaparin (LOVENOX) injection 60 mg  60 mg Subcutaneous Q24H Norins, Rosalyn Gess, MD   60 mg at 08/08/19 0912  . ketorolac (TORADOL) 30 MG/ML injection 30 mg  30 mg Intravenous Q6H PRN Norins, Rosalyn Gess, MD   30 mg at 08/07/19 1544  . lactated ringers infusion   Intravenous Continuous Rolly Salter, MD 75 mL/hr at 08/08/19 0600    . levothyroxine (SYNTHROID) tablet 75 mcg  75 mcg Oral Q0600 Jacques Navy, MD   75 mcg at 08/08/19 7829  . morphine 2 MG/ML injection 2-4 mg  2-4 mg Intravenous Q3H PRN Rolly Salter, MD   2 mg at 08/08/19 0912  . ondansetron (ZOFRAN) injection 4 mg  4 mg Intravenous Q6H PRN Rolly Salter, MD   4 mg at 08/07/19 2054  . phenazopyridine (PYRIDIUM) tablet 100 mg  100 mg Oral TID WC Rolly Salter, MD   100 mg at 08/08/19 1250  . piperacillin-tazobactam (ZOSYN) IVPB 3.375 g  3.375 g Intravenous Trixie Deis, MD         No Known Allergies  ROS:   All other systems reviewed & are negative except per HPI or as noted below: Constitutional:  No fevers, chills, sweats.  Weight stable Eyes:  No vision changes, No discharge HENT:  No sore throats, nasal drainage Lymph: No neck swelling, No bruising easily Pulmonary:  No cough, productive sputum CV: No orthopnea, PND  Patient walks 20 minutes for about 1 miles without  difficulty.  No exertional chest/neck/shoulder/arm pain. GI: No personal nor family history of  inflammatory bowel disease, irritable bowel syndrome, allergy such as Celiac Sprue, dietary/dairy problems, colitis, ulcers nor gastritis.  No recent sick contacts/gastroenteritis.  No travel outside the country.  No changes in diet. Renal: No pneumaturia, No hematuria Genital:  No drainage, bleeding, masses Musculoskeletal: No severe joint pain.  Good ROM major joints Skin:  No sores or lesions.  No rashes Heme/Lymph:  No easy bleeding.  No swollen lymph nodes Neuro: No focal weakness/numbness.  No seizures Psych: No suicidal ideation.  No hallucinations  BP (!) 148/91 (BP Location: Right Arm)   Pulse 71   Temp 98.6 F (37 C) (Oral)   Resp 17   Ht 5\' 6"  (1.676 m)   Wt (!) 154.2 kg   SpO2 98%   BMI 54.87 kg/m   Physical Exam: General: Pt awake/alert/oriented x4 in no major acute distress Eyes: PERRL, normal EOM. Sclera nonicteric Neuro: CN II-XII intact w/o focal sensory/motor deficits. Lymph: No head/neck/groin lymphadenopathy Psych:  No delerium/psychosis/paranoia HENT: Normocephalic, Mucus membranes moist.  No thrush Neck: Supple, No tracheal deviation Chest: No pain.  Good respiratory excursion. CV:  Pulses intact.  Regular rhythm Abdomen: Soft, Nondistended.  Mild discomfort left lower quadrant greater than suprapubic region.  Obese but soft.  Panniculus clean.  No guarding.  No incarcerated hernias. Gen:  No inguinal hernias.  No inguinal lymphadenopathy.   Ext:  SCDs BLE.  No significant edema.  No cyanosis Skin: No petechiae / purpurea.  No major sores Musculoskeletal: No severe joint pain.  Good ROM major joints   Results:   Labs: Results for orders placed or performed during the hospital encounter of 08/06/19 (from the past 48 hour(s))  Lipase, blood     Status: None   Collection Time: 08/06/19  7:24 PM  Result Value Ref Range   Lipase 23 11 - 51 U/L    Comment:  Performed at [facility], [place] [address], [place], [place] [address]  Comprehensive metabolic panel     Status: Abnormal   Collection Time: 08/06/19  7:24 PM  Result Value Ref Range   Sodium 138 135 - 145 mmol/L   Potassium 3.4 (L) 3.5 - 5.1 mmol/L   Chloride 104 98 - 111 mmol/L   CO2 25 22 - 32 mmol/L   Glucose, Bld 120 (H) 70 - 99 mg/dL   BUN 8 6 - 20 mg/dL   Creatinine, Ser 0.99 0.44 - 1.00 mg/dL   Calcium 9.2 8.9 - 10.3 mg/dL   Total Protein 8.8 (H) 6.5 - 8.1 g/dL   Albumin 4.4 3.5 - 5.0 g/dL   AST 33 15 - 41 U/L   ALT 27 0 - 44 U/L   Alkaline Phosphatase 84 38 - 126 U/L   Total Bilirubin 0.7 0.3 - 1.2 mg/dL   GFR calc non Af Amer >60 >60 mL/min   GFR calc Af Amer >60 >60 mL/min   Anion gap 9 5 - 15    Comment: Performed at [facility], [place] [address], [place], [place] [address]  CBC     Status: Abnormal   Collection Time: 08/06/19  7:24 PM  Result Value Ref Range   WBC 8.5  4.0 - 10.5 K/uL   RBC 4.92 3.87 - 5.11 MIL/uL   Hemoglobin 15.3 (H) 12.0 - 15.0 g/dL   HCT 16.1 (H) 09.6 - 04.5 %   MCV 97.4 80.0 - 100.0 fL   MCH 31.1 26.0 - 34.0 pg   MCHC 31.9 30.0 - 36.0 g/dL   RDW 40.9 81.1 - 91.4 %   Platelets 230 150 - 400 K/uL   nRBC 0.0 0.0 - 0.2 %    Comment: Performed at [facility], [address], [place], [place] [address]  I-Stat beta hCG blood, ED     Status: None   Collection Time: 08/06/19  7:36 PM  Result Value Ref Range   I-stat hCG, quantitative <5.0 <5 mIU/mL   Comment 3            Comment:   GEST. AGE      CONC.  (mIU/mL)   <=1 WEEK        5 - 50     2 WEEKS       50 - 500     3 WEEKS       100 - 10,000     4 WEEKS     1,000 - 30,000        FEMALE AND NON-PREGNANT FEMALE:     LESS THAN 5 mIU/mL   Urinalysis, Routine w reflex microscopic     Status: Abnormal   Collection Time: 08/06/19  8:38 PM  Result Value Ref Range   Color, Urine AMBER (A) YELLOW    Comment: BIOCHEMICALS MAY BE AFFECTED BY  COLOR   APPearance CLEAR CLEAR   Specific Gravity, Urine 1.018 1.005 - 1.030   pH 6.0 5.0 - 8.0   Glucose, UA NEGATIVE NEGATIVE mg/dL   Hgb urine dipstick NEGATIVE NEGATIVE   Bilirubin Urine NEGATIVE NEGATIVE   Ketones, ur NEGATIVE NEGATIVE mg/dL   Protein, ur 30 (A) NEGATIVE mg/dL   Nitrite NEGATIVE NEGATIVE   Leukocytes,Ua SMALL (A) NEGATIVE   RBC / HPF 0-5 0 - 5 RBC/hpf   WBC, UA 0-5 0 - 5 WBC/hpf   Bacteria, UA NONE SEEN NONE SEEN   Squamous Epithelial / LPF 0-5 0 - 5   Mucus PRESENT    Hyaline Casts, UA PRESENT     Comment: Performed at [facility], [address], [place], [place] [address]  SARS CORONAVIRUS 2 (TAT 6-24 HRS) Nasopharyngeal Nasopharyngeal Swab     Status: None   Collection Time: 08/07/19  1:03 AM   Specimen: Nasopharyngeal Swab  Result Value Ref Range   SARS Coronavirus 2 NEGATIVE NEGATIVE    Comment: (NOTE) SARS-CoV-2 target nucleic acids are NOT DETECTED. The SARS-CoV-2 RNA is generally detectable in upper and lower respiratory specimens during the acute phase of infection. Negative results do not preclude SARS-CoV-2 infection, do not rule out co-infections with other pathogens, and should not be used as the sole basis for treatment or other patient management decisions. Negative results must be combined with clinical observations, patient history, and epidemiological information. The expected result is Negative. Fact Sheet for Patients: HairSlick.no Fact Sheet for Healthcare Providers: quierodirigir.com This test is not yet approved or cleared by the [place] FDA and  has been authorized for detection and/or diagnosis of SARS-CoV-2 by FDA under an Emergency Use Authorization (EUA). This EUA will remain  in effect (meaning this test can be used) for the duration of the COVID-19 declaration under Section 56 4(b)(1) of the Act, 21  U.S.C. section 360bbb-3(b)(1), unless the  authorization is terminated or revoked sooner. Performed at [facility], [address] [place], [place] [address]   HIV Antibody (routine testing w rflx)     Status: None   Collection Time: 08/07/19  3:20 AM  Result Value Ref Range   HIV Screen 4th Generation wRfx NON REACTIVE NON REACTIVE    Comment: Performed at [facility], [address], [place], [place] [address]  Basic metabolic panel     Status: Abnormal   Collection Time: 08/07/19  8:16 AM  Result Value Ref Range   Sodium 138 135 - 145 mmol/L   Potassium 3.3 (L) 3.5 - 5.1 mmol/L   Chloride 107 98 - 111 mmol/L   CO2 23 22 - 32 mmol/L   Glucose, Bld 97 70 - 99 mg/dL   BUN 7 6 - 20 mg/dL   Creatinine, Ser 0.98 0.44 - 1.00 mg/dL   Calcium 8.7 (L) 8.9 - 10.3 mg/dL   GFR calc non Af Amer >60 >60 mL/min   GFR calc Af Amer >60 >60 mL/min   Anion gap 8 5 - 15    Comment: Performed at [facility], [address] [place], [place] [address]  CBC     Status: None   Collection Time: 08/07/19  8:16 AM  Result Value Ref Range   WBC 7.0 4.0 - 10.5 K/uL   RBC 4.55 3.87 - 5.11 MIL/uL   Hemoglobin 14.1 12.0 - 15.0 g/dL   HCT 78.2 95.6 - 21.3 %   MCV 96.5 80.0 - 100.0 fL   MCH 31.0 26.0 - 34.0 pg   MCHC 32.1 30.0 - 36.0 g/dL   RDW 08.6 57.8 - 46.9 %   Platelets 212 150 - 400 K/uL   nRBC 0.0 0.0 - 0.2 %    Comment: Performed at [facility], [address] [place], [place] [address]  Magnesium     Status: None   Collection Time: 08/07/19  8:16 AM  Result Value Ref Range   Magnesium 1.9 1.7 - 2.4 mg/dL    Comment: Performed at [facility], [address], [place], [place] [address]  Basic metabolic panel     Status: Abnormal   Collection Time: 08/08/19  2:51 AM  Result Value Ref Range   Sodium 137 135 - 145 mmol/L   Potassium 3.4 (L) 3.5 - 5.1 mmol/L   Chloride 106 98 - 111 mmol/L   CO2 23 22 - 32 mmol/L   Glucose, Bld 82 70 - 99 mg/dL   BUN 6 6 - 20  mg/dL   Creatinine, Ser 4.40 0.44 - 1.00 mg/dL   Calcium 8.6 (L) 8.9 - 10.3 mg/dL   GFR calc non Af Amer >60 >60 mL/min   GFR calc Af Amer >60 >60 mL/min   Anion gap 8 5 - 15    Comment: Performed at [facility], [address] [place], [place] [address]  CBC     Status: None   Collection Time: 08/08/19  2:51 AM  Result Value Ref Range   WBC 6.6 4.0 - 10.5 K/uL   RBC 4.35 3.87 - 5.11 MIL/uL   Hemoglobin 13.6 12.0 - 15.0 g/dL   HCT 53.6 64.4 - 03.4 %   MCV 97.7 80.0 - 100.0 fL   MCH 31.3 26.0 - 34.0 pg   MCHC 32.0 30.0 - 36.0 g/dL   RDW 74.2 59.5 - 63.8 %  Platelets 194 150 - 400 K/uL   nRBC 0.0 0.0 - 0.2 %    Comment: Performed at [facility], [address], [place], [place] [address]  Magnesium     Status: None   Collection Time: 08/08/19  2:51 AM  Result Value Ref Range   Magnesium 1.8 1.7 - 2.4 mg/dL    Comment: Performed at [facility], [address] [place], [place] [address]    Imaging / Studies: Ct Abdomen Pelvis W Contrast  Result Date: 08/06/2019 CLINICAL DATA:  Abdominal pain, diverticulitis suspected, UTI for over a week, left-sided abdominal pain EXAM: CT ABDOMEN AND PELVIS WITH CONTRAST TECHNIQUE: Multidetector CT imaging of the abdomen and pelvis was performed using the standard protocol following bolus administration of intravenous contrast. CONTRAST:  OMNIPAQUE IOHEXOL 300 MG/ML  SOLN COMPARISON:  CT abdomen pelvis 09/24/2018 FINDINGS: Lower chest: Atelectatic changes in the left lung base. Lung bases are otherwise clear. Normal heart size. No pericardial effusion. Hepatobiliary: No focal liver abnormality is seen. No gallstones, gallbladder wall thickening, or biliary dilatation. Pancreas: Unremarkable. No pancreatic ductal dilatation or surrounding inflammatory changes. Spleen: Normal in size without focal abnormality. Adrenals/Urinary Tract: Adrenal glands are unremarkable. Kidneys are normal,  without renal calculi, focal lesion, or hydronephrosis. Urinary bladder is largely decompressed at the time exam. There is however asymmetric mural thickening and hypoattenuation within the bladder wall and loss of clear lead discernible fat plane between the bladder and associated rim enhancing collection between the bladder and proximal sigmoid colon. Stomach/Bowel: Distal esophagus, stomach and duodenal sweep are unremarkable. No small bowel wall thickening or dilatation. No evidence of obstruction. A normal appendix is visualized. Proximal colon is unremarkable. There is segmental thickening of the sigmoid colon centered upon a region of numerous colonic diverticula and an area of inflammation which was present on comparison exam from 09/24/2018. There is now a rim enhancing collection extending from the wall with surrounding phlegmonous change in reactive adenopathy as well as some adjacent low-attenuation free fluid as well. No resulting obstruction. No extraluminal gas. Vascular/Lymphatic: The aorta is normal caliber. Reactive adenopathy in the low pelvis in the region of phlegmonous change. Reproductive: Anteverted uterus. The left ovary is in close proximity to the region of inflammation in the left lower quadrant, positioned posteriorly as evidence by the course of the left gonadal vein. There is loss of discernible fat plane of the anterior wall of left ovary. The right ovary is unremarkable. Other: Free fluid and phlegmonous change in the left lower quadrant and deep pelvis with a rim enhancing collection measuring approximately 4 x 3.8 x 4.5 cm in size concerning for a contained perforation or abscess formation. Loss of discernible fat planes and reactive stranding of the bladder could raise suspicion for possible colovesicular fistulization. No other fluid collections are evident. No free air. No bowel containing hernias. Musculoskeletal: No acute osseous abnormality or suspicious osseous lesion.  Degenerative changes in the spine are maximal at L5-S1. Additional vacuum phenomenon and early degenerative sclerosis noted at the SI joints. IMPRESSION: 1. Acute diverticulitis of the sigmoid colon centered upon a region of inflammation which was present on comparison exam from 09/24/2018. There is now a 4 x 3.8 x 4.5 cm rim enhancing collection extending from the colonic wall with surrounding phlegmonous change in the left lower quadrant and deep pelvis, concerning for a contained perforation or abscess formation. Loss of discernible fat planes and reactive bladder wall thickening and perivesicular stranding/features of cystitis raise  suspicion for possible colovesicular fistulization. Such a finding could be further evaluated with a limited CT of the pelvis following rectal contrast administration. 2. Additional stranding and hyperemia of the left ovary which is in close proximity to the rim enhancing collection. Finding is nonspecific though a colo-ovarian fistula is not fully excluded. Electronically Signed   By: Kreg ShropshirePrice  DeHay M.D.   On: 08/06/2019 23:34    Medications / Allergies: per chart  Antibiotics: Anti-infectives (From admission, onward)   Start     Dose/Rate Route Frequency Ordered Stop   08/08/19 1800  piperacillin-tazobactam (ZOSYN) IVPB 3.375 g     3.375 g 12.5 mL/hr over 240 Minutes Intravenous Every 8 hours 08/08/19 1625     08/07/19 1000  cefTRIAXone (ROCEPHIN) 2 g in sodium chloride 0.9 % 100 mL IVPB  Status:  Discontinued     2 g 200 mL/hr over 30 Minutes Intravenous Every 24 hours 08/07/19 0751 08/08/19 1625   08/07/19 0100  metroNIDAZOLE (FLAGYL) IVPB 500 mg  Status:  Discontinued     500 mg 100 mL/hr over 60 Minutes Intravenous 2 times daily 08/07/19 0042 08/08/19 1625   08/07/19 0000  piperacillin-tazobactam (ZOSYN) IVPB 3.375 g     3.375 g 100 mL/hr over 30 Minutes Intravenous  Once 08/06/19 2347 08/07/19 0200        Note: Portions of this report may have been  transcribed using voice recognition software. Every effort was made to ensure accuracy; however, inadvertent computerized transcription errors may be present.   Any transcriptional errors that result from this process are unintentional.    Ardeth SportsmanSteven C. Shlome Baldree, MD, FACS, MASCRS Gastrointestinal and Minimally Invasive Surgery  [facility] [address] [place], [place] [id number] [phone] Main / Paging [phone] Fax     08/08/2019

## 2019-08-09 LAB — CBC
HCT: 43.3 % (ref 36.0–46.0)
Hemoglobin: 13.6 g/dL (ref 12.0–15.0)
MCH: 30.5 pg (ref 26.0–34.0)
MCHC: 31.4 g/dL (ref 30.0–36.0)
MCV: 97.1 fL (ref 80.0–100.0)
Platelets: 183 10*3/uL (ref 150–400)
RBC: 4.46 MIL/uL (ref 3.87–5.11)
RDW: 12.8 % (ref 11.5–15.5)
WBC: 5.2 10*3/uL (ref 4.0–10.5)
nRBC: 0 % (ref 0.0–0.2)

## 2019-08-09 LAB — BASIC METABOLIC PANEL
Anion gap: 11 (ref 5–15)
BUN: <5 mg/dL — ABNORMAL LOW (ref 6–20)
CO2: 24 mmol/L (ref 22–32)
Calcium: 8.5 mg/dL — ABNORMAL LOW (ref 8.9–10.3)
Chloride: 104 mmol/L (ref 98–111)
Creatinine, Ser: 0.72 mg/dL (ref 0.44–1.00)
GFR calc Af Amer: >60 mL/min (ref >60)
GFR calc non Af Amer: >60 mL/min (ref >60)
Glucose, Bld: 63 mg/dL — ABNORMAL LOW (ref 70–99)
Potassium: 3.2 mmol/L — ABNORMAL LOW (ref 3.5–5.1)
Sodium: 139 mmol/L (ref 135–145)

## 2019-08-09 LAB — PROTIME-INR
INR: 1.1 (ref 0.8–1.2)
Prothrombin Time: 13.7 seconds (ref 11.4–15.2)

## 2019-08-09 LAB — MAGNESIUM: Magnesium: 1.9 mg/dL (ref 1.7–2.4)

## 2019-08-09 LAB — APTT: aPTT: 31 seconds (ref 24–36)

## 2019-08-09 MED ORDER — ALUM & MAG HYDROXIDE-SIMETH 200-200-20 MG/5ML PO SUSP
30.0000 mL | Freq: Four times a day (QID) | ORAL | Status: DC | PRN
  Administered 2019-08-12: 30 mL via ORAL
  Filled 2019-08-09: qty 30

## 2019-08-09 MED ORDER — HYDROCORTISONE (PERIANAL) 2.5 % EX CREA
1.0000 "application " | TOPICAL_CREAM | Freq: Four times a day (QID) | CUTANEOUS | Status: DC | PRN
  Filled 2019-08-09: qty 28.35

## 2019-08-09 MED ORDER — ENSURE SURGERY PO LIQD
237.0000 mL | Freq: Two times a day (BID) | ORAL | Status: DC
  Administered 2019-08-09 – 2019-08-13 (×2): 237 mL via ORAL
  Filled 2019-08-09 (×12): qty 237

## 2019-08-09 MED ORDER — LIP MEDEX EX OINT
1.0000 "application " | TOPICAL_OINTMENT | Freq: Two times a day (BID) | CUTANEOUS | Status: DC
  Administered 2019-08-09 – 2019-08-13 (×8): 1 via TOPICAL
  Filled 2019-08-09 (×3): qty 7

## 2019-08-09 MED ORDER — MAGIC MOUTHWASH
15.0000 mL | Freq: Four times a day (QID) | ORAL | Status: DC | PRN
  Filled 2019-08-09: qty 15

## 2019-08-09 MED ORDER — METHOCARBAMOL 1000 MG/10ML IJ SOLN
1000.0000 mg | Freq: Four times a day (QID) | INTRAVENOUS | Status: DC | PRN
  Filled 2019-08-09: qty 10

## 2019-08-09 MED ORDER — PHENOL 1.4 % MT LIQD: 1.0000 | OROMUCOSAL | Status: DC | PRN

## 2019-08-09 MED ORDER — MENTHOL 3 MG MT LOZG: 1.0000 | LOZENGE | OROMUCOSAL | Status: DC | PRN

## 2019-08-09 MED ORDER — HYDROCORTISONE 1 % EX CREA
1.0000 "application " | TOPICAL_CREAM | Freq: Three times a day (TID) | CUTANEOUS | Status: DC | PRN
  Filled 2019-08-09: qty 28

## 2019-08-09 MED ORDER — PROCHLORPERAZINE EDISYLATE 10 MG/2ML IJ SOLN: 5.0000 mg | INTRAMUSCULAR | Status: DC | PRN

## 2019-08-09 MED ORDER — GUAIFENESIN-DM 100-10 MG/5ML PO SYRP: 10.0000 mL | ORAL_SOLUTION | ORAL | Status: DC | PRN

## 2019-08-09 MED ORDER — POTASSIUM CHLORIDE CRYS ER 20 MEQ PO TBCR
40.0000 meq | EXTENDED_RELEASE_TABLET | ORAL | Status: AC
  Administered 2019-08-09 (×2): 40 meq via ORAL
  Filled 2019-08-09 (×2): qty 2

## 2019-08-09 MED ORDER — POTASSIUM CHLORIDE 10 MEQ/100ML IV SOLN: 10.0000 meq | INTRAVENOUS | Status: DC

## 2019-08-09 MED ORDER — PSYLLIUM 95 % PO PACK
1.0000 | PACK | Freq: Every day | ORAL | Status: DC
  Administered 2019-08-09 – 2019-08-14 (×3): 1 via ORAL
  Filled 2019-08-09 (×6): qty 1

## 2019-08-09 MED ORDER — LACTATED RINGERS IV BOLUS: 1000.0000 mL | Freq: Three times a day (TID) | INTRAVENOUS | Status: AC | PRN

## 2019-08-09 NOTE — Progress Notes (Addendum)
Lori Mcknight [id number] Jan 04, 1986  CARE TEAM:  PCP: Claiborne RiggFleming, Zelda W, NP  Outpatient Care Team: Patient Care Team: Claiborne RiggFleming, Zelda W, NP as PCP - General (Nurse Practitioner) Allie Bossierove, Myra C, MD as Consulting Physician (Obstetrics and Gynecology) Karie SodaGross, Anaih Brander, MD as Consulting Physician (Colon and Rectal Surgery)  Inpatient Treatment Team: Treatment Team: Attending Provider: Rolly SalterPatel, Pranav M, MD; Consulting Physician: Rolly SalterPatel, Pranav M, MD; Rounding Team: Merlyn Albertriadhosp, Wl2, MD; Technician: Birdie HopesSoto, Alejandra N, [place]; Registered Nurse: Jackelyn KnifeMcIlquham, Danielle, RN; Registered Nurse: Alfredo BattyAbernathy, Amy P, RN; Consulting Physician: Montez Moritacs, Md, MD; Utilization Review: Hanley Haysowell, Deborah T, RN; Registered Nurse: Emeterio ReeveWood, Jasmine, RN (Inactive)   Problem List:   Principal Problem:   Diverticulitis of intestine with abscess Active Problems:   PCOS (polycystic ovarian syndrome)   Essential hypertension   Hypothyroid   Obesity, morbid, BMI 50 or higher (HCC)   Endometriosis s/p ablation   Dysuria   Family history of colon cancer in father      * No surgery found *      Assessment  Pelvic abscess between sigmoid colon and bladder suspicious for sigmoid diverticulitis.    [facility](Hospital Stay = 2 days)  Plan:  IV Zosyn for better coverage in this complicated case.  Because the abscess is greater than 4 cm, see if interventional radiology can at least aspirate if not drain to help get him under better control.  It looks like there is a window in the left suprapubic region but will defer to them.  Addendum: Apparently seen this morning and they feel there is no good window and not a classic spherical abscess to drain.  They recommend follow-up CAT scan in 3 days to see if there is resolution or progression.   Since she has return of bowel function with no more leukocytosis or fevers and less abdominal pain, start to advance diet and see if she can transition to outpatient therapy.  Most likely would repeat CAT  scan before discharge in a few days  She does have some urinary symptoms but no hard indication of colovesical fistula.  Obviously high risk based on the location and symptoms.  Believe with drainage &  Antibiotics this will get under control.  If develops a true colovesical fistula, this will require surgical repair at the time of robotic sigmoid colectomy  Episode of irregular bowels question of melena.  No drop in hemoglobin.  Monitor and follow.  Again another reason to consider colonoscopy at some point.  I believe she would benefit for outpatient follow-up with probable colonoscopy in 6 to 8 weeks to rule out cancer given her family history of her father having cancer <34 years old.  Rule out any colon stricturing.  Because this is her second attack documented in the past 12 months, this attack complicated with an abscess, and she is only in her 30s; I believe she would benefit from elective rectosigmoid surgical resection to remove the chronic section of colon planned break the cycle of attacks.  At the very least have colonoscopy after this flare is under control and have a surgical discussion about this.  She is leaning towards these recommendations.  Right now there is no hard evidence that she needs any emergent surgery requiring a Hartmann resection.  We will follow with the medicine service.  VTE prophylaxis- SCDs, etc  Mobilize as tolerated to help recovery  25 minutes spent in review, evaluation, examination, counseling, and coordination of care.  More than 50% of that time was spent in  counseling.  08/09/2019    Subjective: (Chief complaint)  Head darker tacky bowel movement which is in usual.  No frank blood.  No nausea or vomiting.  Abdominal pain less.  Just seen by radiology.  They declined to try drainage  Objective:  Vital signs:  Vitals:   08/08/19 0556 08/08/19 1417 08/08/19 2152 08/09/19 0538  BP: 138/77 (!) 148/91 140/82 132/70  Pulse: 76 71 66 74    Resp: 16 17 16 18   Temp: 98.1 F (36.7 C) 98.6 F (37 C) 98 F (36.7 C) (!) 97.4 F (36.3 C)  TempSrc:  Oral Oral Oral  SpO2: 99% 98% 100% 99%  Weight:      Height:        Last BM Date: 08/08/19  Intake/Output   Yesterday:  10/26 0701 - 10/27 0700 In: 1531 [P.O.:30; I.V.:1413.3; IV Piggyback:87.7] Out: -  This shift:  No intake/output data recorded.  Bowel function:  Flatus: YES  BM:  YES  Drain: (No drain)   Physical Exam:  General: Pt awake/alert/oriented x4 in no acute distress Eyes: PERRL, normal EOM.  Sclera clear.  No icterus Neuro: CN II-XII intact w/o focal sensory/motor deficits. Lymph: No head/neck/groin lymphadenopathy Psych:  No delerium/psychosis/paranoia HENT: Normocephalic, Mucus membranes moist.  No thrush Neck: Supple, No tracheal deviation Chest: No chest wall pain w good excursion CV:  Pulses intact.  Regular rhythm MS: Normal AROM mjr joints.  No obvious deformity  Abdomen: Morbidly obese with clean panniculus.  Soft.  Nondistended.  Tenderness at Left lower quadrant/flank.  Mild.  No evidence of peritonitis.  No incarcerated hernias.  Ext:  No deformity.  No mjr edema.  No cyanosis Skin: No petechiae / purpura  Results:   Cultures: Recent Results (from the past 720 hour(s))  Urine culture     Status: Abnormal   Collection Time: 07/29/19  1:29 PM   Specimen: Urine, Random  Result Value Ref Range Status   Specimen Description URINE, RANDOM  Final   Special Requests NONE  Final   Culture (A)  Final    <10,000 COLONIES/mL INSIGNIFICANT GROWTH Performed at [facility], [address] [place], [place]    Report Status 07/30/2019 FINAL  Final  SARS CORONAVIRUS 2 (TAT 6-24 HRS) Nasopharyngeal Nasopharyngeal Swab     Status: None   Collection Time: 08/07/19  1:03 AM   Specimen: Nasopharyngeal Swab  Result Value Ref Range Status   SARS Coronavirus 2 NEGATIVE NEGATIVE Final    Comment: (NOTE) SARS-CoV-2 target nucleic  acids are NOT DETECTED. The SARS-CoV-2 RNA is generally detectable in upper and lower respiratory specimens during the acute phase of infection. Negative results do not preclude SARS-CoV-2 infection, do not rule out co-infections with other pathogens, and should not be used as the sole basis for treatment or other patient management decisions. Negative results must be combined with clinical observations, patient history, and epidemiological information. The expected result is Negative. Fact Sheet for Patients: 08/09/19 Fact Sheet for Healthcare Providers: HairSlick.no This test is not yet approved or cleared by the quierodirigir.com FDA and  has been authorized for detection and/or diagnosis of SARS-CoV-2 by FDA under an Emergency Use Authorization (EUA). This EUA will remain  in effect (meaning this test can be used) for the duration of the COVID-19 declaration under Section 56 4(b)(1) of the Act, 21 U.S.C. section 360bbb-3(b)(1), unless the authorization is terminated or revoked sooner. Performed at [facility], [address] [place], [place]  [address]     Labs: Results for orders placed or performed during the hospital encounter of 08/06/19 (from the past 48 hour(s))  Basic metabolic panel     Status: Abnormal   Collection Time: 08/08/19  2:51 AM  Result Value Ref Range   Sodium 137 135 - 145 mmol/L   Potassium 3.4 (L) 3.5 - 5.1 mmol/L   Chloride 106 98 - 111 mmol/L   CO2 23 22 - 32 mmol/L   Glucose, Bld 82 70 - 99 mg/dL   BUN 6 6 - 20 mg/dL   Creatinine, Ser 0.72 0.44 - 1.00 mg/dL   Calcium 8.6 (L) 8.9 - 10.3 mg/dL   GFR calc non Af Amer >60 >60 mL/min   GFR calc Af Amer >60 >60 mL/min   Anion gap 8 5 - 15    Comment: Performed at [facility], [place] [address], [place], [place] [address]  CBC     Status: None   Collection Time: 08/08/19  2:51 AM  Result Value Ref Range   WBC 6.6 4.0  - 10.5 K/uL   RBC 4.35 3.87 - 5.11 MIL/uL   Hemoglobin 13.6 12.0 - 15.0 g/dL   HCT 42.5 36.0 - 46.0 %   MCV 97.7 80.0 - 100.0 fL   MCH 31.3 26.0 - 34.0 pg   MCHC 32.0 30.0 - 36.0 g/dL   RDW 12.6 11.5 - 15.5 %   Platelets 194 150 - 400 K/uL   nRBC 0.0 0.0 - 0.2 %    Comment: Performed at [facility], [place] [address], [place], [place] [address]  Magnesium     Status: None   Collection Time: 08/08/19  2:51 AM  Result Value Ref Range   Magnesium 1.8 1.7 - 2.4 mg/dL    Comment: Performed at [facility], [place] [address], [place], [place] [address]  Basic metabolic panel     Status: Abnormal   Collection Time: 08/09/19  3:15 AM  Result Value Ref Range   Sodium 139 135 - 145 mmol/L   Potassium 3.2 (L) 3.5 - 5.1 mmol/L   Chloride 104 98 - 111 mmol/L   CO2 24 22 - 32 mmol/L   Glucose, Bld 63 (L) 70 - 99 mg/dL   BUN <5 (L) 6 - 20 mg/dL   Creatinine, Ser 0.72 0.44 - 1.00 mg/dL   Calcium 8.5 (L) 8.9 - 10.3 mg/dL   GFR calc non Af Amer >60 >60 mL/min   GFR calc Af Amer >60 >60 mL/min   Anion gap 11 5 - 15    Comment: Performed at [facility], [place] [address], [place], [place] [address]  CBC     Status: None   Collection Time: 08/09/19  3:15 AM  Result Value Ref Range   WBC 5.2 4.0 - 10.5 K/uL   RBC 4.46 3.87 - 5.11 MIL/uL   Hemoglobin 13.6 12.0 - 15.0 g/dL   HCT 43.3 36.0 - 46.0 %   MCV 97.1 80.0 - 100.0 fL   MCH 30.5 26.0 - 34.0 pg   MCHC 31.4 30.0 - 36.0 g/dL   RDW 12.8 11.5 - 15.5 %   Platelets 183 150 - 400 K/uL   nRBC 0.0 0.0 - 0.2 %    Comment: Performed at [facility], [place] [address] [place], [place] [address]  Magnesium     Status: None   Collection Time: 08/09/19  3:15 AM  Result Value Ref Range  Magnesium 1.9 1.7 - 2.4 mg/dL    Comment: Performed at [facility], [address] [place], [place] [address]  Protime-INR     Status: None   Collection Time: 08/09/19  3:15 AM    Result Value Ref Range   Prothrombin Time 13.7 11.4 - 15.2 seconds   INR 1.1 0.8 - 1.2    Comment: (NOTE) INR goal varies based on device and disease states. Performed at [facility], [address] [place], [place] [address]   APTT     Status: None   Collection Time: 08/09/19  3:15 AM  Result Value Ref Range   aPTT 31 24 - 36 seconds    Comment: Performed at [facility], [address], [place], [place] [address]    Imaging / Studies: No results found.  Medications / Allergies: per chart  Antibiotics: Anti-infectives (From admission, onward)   Start     Dose/Rate Route Frequency Ordered Stop   08/08/19 1800  piperacillin-tazobactam (ZOSYN) IVPB 3.375 g     3.375 g 12.5 mL/hr over 240 Minutes Intravenous Every 8 hours 08/08/19 1625     08/07/19 1000  cefTRIAXone (ROCEPHIN) 2 g in sodium chloride 0.9 % 100 mL IVPB  Status:  Discontinued     2 g 200 mL/hr over 30 Minutes Intravenous Every 24 hours 08/07/19 0751 08/08/19 1625   08/07/19 0100  metroNIDAZOLE (FLAGYL) IVPB 500 mg  Status:  Discontinued     500 mg 100 mL/hr over 60 Minutes Intravenous 2 times daily 08/07/19 0042 08/08/19 1625   08/07/19 0000  piperacillin-tazobactam (ZOSYN) IVPB 3.375 g     3.375 g 100 mL/hr over 30 Minutes Intravenous  Once 08/06/19 2347 08/07/19 0200        Note: Portions of this report may have been transcribed using voice recognition software. Every effort was made to ensure accuracy; however, inadvertent computerized transcription errors may be present.   Any transcriptional errors that result from this process are unintentional.     Ardeth Sportsman, MD, FACS, MASCRS Gastrointestinal and Minimally Invasive Surgery    [address], [address] [place], [place] [address] [phone] Main / Paging [phone] Fax

## 2019-08-09 NOTE — Progress Notes (Signed)
[facility] Progress Note  Patient: Lori Mcknight OJJ:[id number]   PCP: [name], NP DOB: 1986/04/10   DOA: 08/06/2019   DOS: 08/09/2019   Date of Service: the patient was seen and examined on 08/09/2019  Chief Complaint  Patient presents with  . Urinary Tract Infection   Brief hospital course: Pt. with PMH of depression, asthma, anxiety, BMI more than 40, PCOS, hypothyroidism, HTN not on meds; presented with complain of abdominal pain, was found to have diverticulitis with microperforation phlegmon.  Currently further plan is continue IV antibiotics.  Subjective: Continues to have abdominal pain.  No burning urination.  No nausea no vomiting.  No fever no chills.  Assessment and Plan: 1.  Diverticulitis with microperforation and phlegmon CT abdomen pelvis shows evidence of rim-enhancing collection measuring approximately 4 x 4 x 4 cm in size.  Concerning for contained perforation or abscess formation. Evidence of diverticulitis in the sigmoid colon. There is also concern for colovesicular fistulization. Initially patient was on only IV Flagyl.  Ceftriaxone was added.  After discussion with surgery transitioning to IV Zosyn. IR consulted feels that the patient does not require any drainage or drain placement for now. Repeat CT scan in 2 to 3 days Currently on clear liquid diet. Pain control. No indication for CT pelvis for now given her acute diverticulitis. Will benefit from outpatient colonoscopy for surgery. Appreciate their assistance.  2.  Trichomonas. Treated with Flagyl. Started on 07/30/2019.  3.  Hypothyroidism. We will continue Synthroid.  4. Body mass index is 54.87 kg/m.  May benefit from dietary consultation and education.  5.  Mild hypoglycemia. Monitor for symptoms.  6.  Hypokalemia. Replaced. Monitor.  Diet: Clear liquid diet DVT Prophylaxis: Subcutaneous Lovenox  Advance goals of care discussion: Full code  Family Communication: no  family was present at bedside, at the time of interview.   Disposition:  Discharge to be determined .  Consultants: General surgery, IR Procedures: none  Scheduled Meds: . [START ON 08/10/2019] enoxaparin (LOVENOX) injection  60 mg Subcutaneous Q24H  . feeding supplement  237 mL Oral BID BM  . levothyroxine  75 mcg Oral Q0600  . lip balm  1 application Topical BID  . phenazopyridine  100 mg Oral TID WC  . psyllium  1 packet Oral Daily   Continuous Infusions: . lactated ringers    . lactated ringers 75 mL/hr at 08/09/19 0950  . methocarbamol (ROBAXIN) IV    . piperacillin-tazobactam (ZOSYN)  IV 3.375 g (08/09/19 1754)   PRN Meds: acetaminophen, albuterol, alum & mag hydroxide-simeth, diphenhydrAMINE, guaiFENesin-dextromethorphan, hydrocortisone, hydrocortisone cream, ketorolac, lactated ringers, magic mouthwash, menthol-cetylpyridinium, methocarbamol (ROBAXIN) IV, morphine injection, ondansetron (ZOFRAN) IV, phenol, prochlorperazine Antibiotics: Anti-infectives (From admission, onward)   Start     Dose/Rate Route Frequency Ordered Stop   08/08/19 1800  piperacillin-tazobactam (ZOSYN) IVPB 3.375 g     3.375 g 12.5 mL/hr over 240 Minutes Intravenous Every 8 hours 08/08/19 1625     08/07/19 1000  cefTRIAXone (ROCEPHIN) 2 g in sodium chloride 0.9 % 100 mL IVPB  Status:  Discontinued     2 g 200 mL/hr over 30 Minutes Intravenous Every 24 hours 08/07/19 0751 08/08/19 1625   08/07/19 0100  metroNIDAZOLE (FLAGYL) IVPB 500 mg  Status:  Discontinued     500 mg 100 mL/hr over 60 Minutes Intravenous 2 times daily 08/07/19 0042 08/08/19 1625   08/07/19 0000  piperacillin-tazobactam (ZOSYN) IVPB 3.375 g     3.375 g 100 mL/hr over 30  Minutes Intravenous  Once 08/06/19 2347 08/07/19 0200      Objective: Physical Exam: Vitals:   08/08/19 2152 08/09/19 0538 08/09/19 1425 08/09/19 1432  BP: 140/82 132/70 (!) 163/131 (!) 156/88  Pulse: 66 74 60 69  Resp: 16 18 16    Temp: 98 F (36.7 C) (!)  97.4 F (36.3 C) 98 F (36.7 C)   TempSrc: Oral Oral Oral   SpO2: 100% 99% 100%   Weight:      Height:        Intake/Output Summary (Last 24 hours) at 08/09/2019 1926 Last data filed at 08/09/2019 1812 Gross per 24 hour  Intake 2692.39 ml  Output -  Net 2692.39 ml   Filed Weights   08/06/19 1812 08/06/19 1816  Weight: (!) 154.2 kg (!) 154.2 kg   General: alert and oriented to time, place, and person. Appear in mild distress, affect appropriate Eyes: PERRL, Conjunctiva normal ENT: Oral Mucosa Clear, moist  Neck: no JVD, no Abnormal Mass Or lumps Cardiovascular: S1 and S2 Present, no Murmur, peripheral pulses symmetrical Respiratory: good respiratory effort, Bilateral Air entry equal and Decreased, no signs of accessory muscle use, Clear to Auscultation, no Crackles, no wheezes Abdomen: Bowel Sound present, Soft and mild tenderness, no hernia Skin: no rashes  Extremities: no Pedal edema, no calf tenderness Neurologic: without any new focal findings Gait not checked due to patient safety concerns  Data Reviewed: I have personally reviewed and interpreted daily labs, tele strips, imagings as discussed above. I reviewed all nursing notes, pharmacy notes, vitals, pertinent old records I have discussed plan of care as described above with RN and patient/family.  CBC: Recent Labs  Lab 08/06/19 1924 08/07/19 0816 08/08/19 0251 08/09/19 0315  WBC 8.5 7.0 6.6 5.2  HGB 15.3* 14.1 13.6 13.6  HCT 47.9* 43.9 42.5 43.3  MCV 97.4 96.5 97.7 97.1  PLT 230 212 194 183   Basic Metabolic Panel: Recent Labs  Lab 08/06/19 1924 08/07/19 0816 08/08/19 0251 08/09/19 0315  NA 138 138 137 139  K 3.4* 3.3* 3.4* 3.2*  CL 104 107 106 104  CO2 25 23 23 24   GLUCOSE 120* 97 82 63*  BUN 8 7 6  <5*  CREATININE 0.99 0.76 0.72 0.72  CALCIUM 9.2 8.7* 8.6* 8.5*  MG  --  1.9 1.8 1.9    Liver Function Tests: Recent Labs  Lab 08/06/19 1924  AST 33  ALT 27  ALKPHOS 84  BILITOT 0.7  PROT  8.8*  ALBUMIN 4.4   Recent Labs  Lab 08/06/19 1924  LIPASE 23   No results for input(s): AMMONIA in the last 168 hours. Coagulation Profile: Recent Labs  Lab 08/09/19 0315  INR 1.1   Cardiac Enzymes: No results for input(s): CKTOTAL, CKMB, CKMBINDEX, TROPONINI in the last 168 hours. BNP (last 3 results) No results for input(s): PROBNP in the last 8760 hours. CBG: No results for input(s): GLUCAP in the last 168 hours. Studies: No results found.   Time spent: 35 minutes  Author: 08/08/19, MD [facility] 08/09/2019 7:26 PM  To reach On-call, see care teams to locate the attending and reach out to them via [email or web address]. If 7PM-7AM, please contact night-coverage If you still have difficulty reaching the attending provider, please page the [facility] (Director on Call) for [facility] on amion for assistance.

## 2019-08-09 NOTE — Progress Notes (Signed)
Patient ID: Lori Mcknight, female   DOB: 12/30/1985, 33 y.o.   MRN: [id number] Request received for consideration of image guided drainage of abdominal abscess in patient.  Latest imaging studies were reviewed by Dr. Laurence Ferrari and area in question is currently too small (thin, linear configuration of collection making access difficult in pt of this size)  for image guided drainage at this time.  Recommend continued antibiotic therapy and reimaging in 3 days. Please contact Dr. Laurence Ferrari at [phone] with any additional questions.

## 2019-08-10 DIAGNOSIS — N809 Endometriosis, unspecified: Secondary | ICD-10-CM

## 2019-08-10 LAB — BASIC METABOLIC PANEL
Anion gap: 8 (ref 5–15)
BUN: 5 mg/dL — ABNORMAL LOW (ref 6–20)
CO2: 24 mmol/L (ref 22–32)
Calcium: 8.7 mg/dL — ABNORMAL LOW (ref 8.9–10.3)
Chloride: 107 mmol/L (ref 98–111)
Creatinine, Ser: 0.76 mg/dL (ref 0.44–1.00)
GFR calc Af Amer: >60 mL/min (ref >60)
GFR calc non Af Amer: >60 mL/min (ref >60)
Glucose, Bld: 83 mg/dL (ref 70–99)
Potassium: 3.3 mmol/L — ABNORMAL LOW (ref 3.5–5.1)
Sodium: 139 mmol/L (ref 135–145)

## 2019-08-10 LAB — CBC
HCT: 43.8 % (ref 36.0–46.0)
Hemoglobin: 13.9 g/dL (ref 12.0–15.0)
MCH: 31 pg (ref 26.0–34.0)
MCHC: 31.7 g/dL (ref 30.0–36.0)
MCV: 97.6 fL (ref 80.0–100.0)
Platelets: 188 10*3/uL (ref 150–400)
RBC: 4.49 MIL/uL (ref 3.87–5.11)
RDW: 12.8 % (ref 11.5–15.5)
WBC: 5.4 10*3/uL (ref 4.0–10.5)
nRBC: 0 % (ref 0.0–0.2)

## 2019-08-10 LAB — MAGNESIUM: Magnesium: 1.8 mg/dL (ref 1.7–2.4)

## 2019-08-10 MED ORDER — POTASSIUM CHLORIDE CRYS ER 20 MEQ PO TBCR
40.0000 meq | EXTENDED_RELEASE_TABLET | Freq: Once | ORAL | Status: AC
  Administered 2019-08-10: 40 meq via ORAL
  Filled 2019-08-10: qty 2

## 2019-08-10 MED ORDER — SACCHAROMYCES BOULARDII 250 MG PO CAPS
250.0000 mg | ORAL_CAPSULE | Freq: Two times a day (BID) | ORAL | Status: DC
  Administered 2019-08-10 – 2019-08-14 (×9): 250 mg via ORAL
  Filled 2019-08-10 (×9): qty 1

## 2019-08-10 NOTE — Progress Notes (Signed)
PROGRESS NOTE    Lori C Grimes  OHY:[id number] DOB: 10-31-1985 DOA: 08/06/2019 PCP: Claiborne Rigg, NP   Brief Narrative: Lori Mcknight is a 33 y.o. female with a history of asthma, anxiety, obesity, PCOS, hypothyroidism. Patient presented secondary to abdominal pain and found to have diverticulitis with microperforation and phlegmon. She was started on IV antibiotics and general surgery consulted.   Assessment & Plan:   Principal Problem:   Diverticulitis of intestine with abscess Active Problems:   PCOS (polycystic ovarian syndrome)   Essential hypertension   Hypothyroid   Obesity, morbid, BMI 50 or higher (HCC)   Endometriosis s/p ablation   Dysuria   Family history of colon cancer in father   Diverticulitis with microperforation and pheglmon General surgery consulted. Plan for IR drainage/aspiration, however, they recommended no drainage at the time. Patient has been managed on IV Zosyn. Symptoms are stable. No blood cultures. -General surgery recommendations: Plan for possibly CT in AM -Continue IV Zosyn  Trichomonas Treated with Flagyl.  Hypothyroidism -Continue Synthroid  Obesity Body mass index is 54.87 kg/m.  Hypokalemia Replete as needed  DVT prophylaxis: Lovenox Code Status:   Code Status: Full Code Family Communication: None at bedside Disposition Plan: Discharge home pending general surgery recommendations   Consultants:   General surgery  Interventional radiology  Procedures:   None  Antimicrobials:  Zosyn    Subjective: Patient states she has some abdominal pain. Taking fluid by mouth made it somewhat worse yesterday.  Objective: Vitals:   08/09/19 1432 08/09/19 2126 08/10/19 0427 08/10/19 0857  BP: (!) 156/88 123/72 106/67   Pulse: 69 69 60   Resp:  18 14   Temp:  98.1 F (36.7 C) 97.8 F (36.6 C)   TempSrc:  Oral Oral   SpO2:  98% 100% 96%  Weight:      Height:        Intake/Output Summary (Last 24 hours) at  08/10/2019 0915 Last data filed at 08/10/2019 0600 Gross per 24 hour  Intake 2392.69 ml  Output -  Net 2392.69 ml   Filed Weights   08/06/19 1812 08/06/19 1816  Weight: (!) 154.2 kg (!) 154.2 kg    Examination:  General exam: Appears calm and comfortable Respiratory system: Clear to auscultation. Respiratory effort normal. Cardiovascular system: S1 & S2 heard, RRR. No murmurs, rubs, gallops or clicks. Gastrointestinal system: Abdomen is nondistended, soft and tender in LU/LLQs. Normal bowel sounds heard. Central nervous system: Alert and oriented. No focal neurological deficits. Extremities: No edema. No calf tenderness Skin: No cyanosis. No rashes Psychiatry: Judgement and insight appear normal. Mood & affect appropriate.     Data Reviewed: I have personally reviewed following labs and imaging studies  CBC: Recent Labs  Lab 08/06/19 1924 08/07/19 0816 08/08/19 0251 08/09/19 0315 08/10/19 0252  WBC 8.5 7.0 6.6 5.2 5.4  HGB 15.3* 14.1 13.6 13.6 13.9  HCT 47.9* 43.9 42.5 43.3 43.8  MCV 97.4 96.5 97.7 97.1 97.6  PLT 230 212 194 183 188   Basic Metabolic Panel: Recent Labs  Lab 08/06/19 1924 08/07/19 0816 08/08/19 0251 08/09/19 0315 08/10/19 0252  NA 138 138 137 139 139  K 3.4* 3.3* 3.4* 3.2* 3.3*  CL 104 107 106 104 107  CO2 25 23 23 24 24   GLUCOSE 120* 97 82 63* 83  BUN 8 7 6  <5* 5*  CREATININE 0.99 0.76 0.72 0.72 0.76  CALCIUM 9.2 8.7* 8.6* 8.5* 8.7*  MG  --  1.9 1.8 1.9  1.8   GFR: Estimated Creatinine Clearance: 152.2 mL/min (by C-G formula based on SCr of 0.76 mg/dL). Liver Function Tests: Recent Labs  Lab 08/06/19 1924  AST 33  ALT 27  ALKPHOS 84  BILITOT 0.7  PROT 8.8*  ALBUMIN 4.4   Recent Labs  Lab 08/06/19 1924  LIPASE 23   No results for input(s): AMMONIA in the last 168 hours. Coagulation Profile: Recent Labs  Lab 08/09/19 0315  INR 1.1   Cardiac Enzymes: No results for input(s): CKTOTAL, CKMB, CKMBINDEX, TROPONINI in the  last 168 hours. BNP (last 3 results) No results for input(s): PROBNP in the last 8760 hours. HbA1C: No results for input(s): HGBA1C in the last 72 hours. CBG: No results for input(s): GLUCAP in the last 168 hours. Lipid Profile: No results for input(s): CHOL, HDL, LDLCALC, TRIG, CHOLHDL, LDLDIRECT in the last 72 hours. Thyroid Function Tests: No results for input(s): TSH, T4TOTAL, FREET4, T3FREE, THYROIDAB in the last 72 hours. Anemia Panel: No results for input(s): VITAMINB12, FOLATE, FERRITIN, TIBC, IRON, RETICCTPCT in the last 72 hours. Sepsis Labs: No results for input(s): PROCALCITON, LATICACIDVEN in the last 168 hours.  Recent Results (from the past 240 hour(s))  SARS CORONAVIRUS 2 (TAT 6-24 HRS) Nasopharyngeal Nasopharyngeal Swab     Status: None   Collection Time: 08/07/19  1:03 AM   Specimen: Nasopharyngeal Swab  Result Value Ref Range Status   SARS Coronavirus 2 NEGATIVE NEGATIVE Final    Comment: (NOTE) SARS-CoV-2 target nucleic acids are NOT DETECTED. The SARS-CoV-2 RNA is generally detectable in upper and lower respiratory specimens during the acute phase of infection. Negative results do not preclude SARS-CoV-2 infection, do not rule out co-infections with other pathogens, and should not be used as the sole basis for treatment or other patient management decisions. Negative results must be combined with clinical observations, patient history, and epidemiological information. The expected result is Negative. Fact Sheet for Patients: SugarRoll.be Fact Sheet for Healthcare Providers: https://www.woods-mathews.com/ This test is not yet approved or cleared by the [place] FDA and  has been authorized for detection and/or diagnosis of SARS-CoV-2 by FDA under an Emergency Use Authorization (EUA). This EUA will remain  in effect (meaning this test can be used) for the duration of the COVID-19 declaration under Section 56  4(b)(1) of the Act, 21 U.S.C. section 360bbb-3(b)(1), unless the authorization is terminated or revoked sooner. Performed at [facility], [place] [address], [place], [place] [address]          Radiology Studies: No results found.      Scheduled Meds: . enoxaparin (LOVENOX) injection  60 mg Subcutaneous Q24H  . feeding supplement  237 mL Oral BID BM  . levothyroxine  75 mcg Oral Q0600  . lip balm  1 application Topical BID  . phenazopyridine  100 mg Oral TID WC  . psyllium  1 packet Oral Daily  . saccharomyces boulardii  250 mg Oral BID   Continuous Infusions: . lactated ringers    . lactated ringers 75 mL/hr at 08/10/19 0600  . methocarbamol (ROBAXIN) IV    . piperacillin-tazobactam (ZOSYN)  IV 3.375 g (08/10/19 0254)     LOS: 3 days     Cordelia Poche, MD [facility] 08/10/2019, 9:15 AM  If 7PM-7AM, please contact night-coverage [email or web address]

## 2019-08-10 NOTE — Progress Notes (Signed)
[facility] Progress Note     Subjective: CC-  Feels about the same as yesterday, no better and no worse. Continues to complain of lower abdominal pain. Some nausea, no emesis. Tolerating few sips of clears. Thinks the Ensure may have increased her pain a little. She reports having about 6 loose nonbloody stools yesterday. WBC 5.4, afebrile.  Objective: Vital signs in last 24 hours: Temp:  [97.8 F (36.6 C)-98.1 F (36.7 C)] 97.8 F (36.6 C) (10/28 0427) Pulse Rate:  [60-69] 60 (10/28 0427) Resp:  [14-18] 14 (10/28 0427) BP: (106-163)/(67-131) 106/67 (10/28 0427) SpO2:  [98 %-100 %] 100 % (10/28 0427) Last BM Date: 08/09/19  Intake/Output from previous day: 10/27 0701 - 10/28 0700 In: 2392.7 [P.O.:540; I.V.:1752.6; IV Piggyback:100.1] Out: -  Intake/Output this shift: No intake/output data recorded.  PE: Gen:  Alert, NAD, pleasant HEENT: EOM's intact, pupils equal and round Card:  RRR Pulm:  Rate and effort normal Abd: obese, soft, no HSM, +BS, mild diffuse TTP with most significant tenderness suprapubic region, LLQ and LUQ Psych: A&Ox3  Skin: no rashes noted, warm and dry  Lab Results:  Recent Labs    08/09/19 0315 08/10/19 0252  WBC 5.2 5.4  HGB 13.6 13.9  HCT 43.3 43.8  PLT 183 188   BMET Recent Labs    08/09/19 0315 08/10/19 0252  NA 139 139  K 3.2* 3.3*  CL 104 107  CO2 24 24  GLUCOSE 63* 83  BUN <5* 5*  CREATININE 0.72 0.76  CALCIUM 8.5* 8.7*   PT/INR Recent Labs    08/09/19 0315  LABPROT 13.7  INR 1.1   CMP     Component Value Date/Time   NA 139 08/10/2019 0252   NA 141 12/09/2016 1113   K 3.3 (L) 08/10/2019 0252   CL 107 08/10/2019 0252   CO2 24 08/10/2019 0252   GLUCOSE 83 08/10/2019 0252   BUN 5 (L) 08/10/2019 0252   BUN 10 12/09/2016 1113   CREATININE 0.76 08/10/2019 0252   CREATININE 0.84 10/20/2016 1205   CALCIUM 8.7 (L) 08/10/2019 0252   PROT 8.8 (H) 08/06/2019 1924   PROT 7.8 11/12/2016 1042   ALBUMIN 4.4  08/06/2019 1924   ALBUMIN 4.0 11/12/2016 1042   AST 33 08/06/2019 1924   ALT 27 08/06/2019 1924   ALKPHOS 84 08/06/2019 1924   BILITOT 0.7 08/06/2019 1924   BILITOT 0.6 11/12/2016 1042   GFRNONAA >60 08/10/2019 0252   GFRNONAA >89 10/20/2016 1205   GFRAA >60 08/10/2019 0252   GFRAA >89 10/20/2016 1205   Lipase     Component Value Date/Time   LIPASE 23 08/06/2019 1924       Studies/Results: No results found.  Anti-infectives: Anti-infectives (From admission, onward)   Start     Dose/Rate Route Frequency Ordered Stop   08/08/19 1800  piperacillin-tazobactam (ZOSYN) IVPB 3.375 g     3.375 g 12.5 mL/hr over 240 Minutes Intravenous Every 8 hours 08/08/19 1625     08/07/19 1000  cefTRIAXone (ROCEPHIN) 2 g in sodium chloride 0.9 % 100 mL IVPB  Status:  Discontinued     2 g 200 mL/hr over 30 Minutes Intravenous Every 24 hours 08/07/19 0751 08/08/19 1625   08/07/19 0100  metroNIDAZOLE (FLAGYL) IVPB 500 mg  Status:  Discontinued     500 mg 100 mL/hr over 60 Minutes Intravenous 2 times daily 08/07/19 0042 08/08/19 1625   08/07/19 0000  piperacillin-tazobactam (ZOSYN) IVPB 3.375 g  3.375 g 100 mL/hr over 30 Minutes Intravenous  Once 08/06/19 2347 08/07/19 0200       Assessment/Plan Hypothyroidism Obesity, BMI 54.87 Trichomonas Family h/o colon cancer in father  Diverticulitis with microperforation and phlegmon - second attack documented in the past 12 months - CT scan 10/24 shows acute diverticulitis of the sigmoid colon with 4 x 3.8 x 4.5 cm rim enhancing collection extending from the colonic wall with surrounding phlegmonous change in the left lower quadrant and deep pelvis concerning for a contained perforation or abscess formation; eactive bladder wall thickening and perivesicular stranding/features of cystitis raise suspicion for possible colovesicular fistulization; stranding and hyperemia of the left ovary  - IR unable to drain fluid collection  ID - zosyn  10/25>> FEN - IVF, CLD ADAT to D1 diet VTE - SCDs, lovenox Foley - none Follow up - TBD  Plan: Continue clear liquids (ok to advance to D1 diet as tolerated) and IV zosyn. Likely plan to repeat CT scan tomorrow. If this resolves without surgery this admission she will need a colonoscopy in 6-8 weeks.   LOS: 3 days    Franne Forts, [facility] 08/10/2019, 8:49 AM Please see Amion for pager number during day hours 7:00am-4:30pm

## 2019-08-11 ENCOUNTER — Inpatient Hospital Stay (HOSPITAL_COMMUNITY): Payer: Self-pay

## 2019-08-11 LAB — BASIC METABOLIC PANEL
Anion gap: 10 (ref 5–15)
BUN: <5 mg/dL — ABNORMAL LOW (ref 6–20)
CO2: 23 mmol/L (ref 22–32)
Calcium: 8.8 mg/dL — ABNORMAL LOW (ref 8.9–10.3)
Chloride: 105 mmol/L (ref 98–111)
Creatinine, Ser: 0.73 mg/dL (ref 0.44–1.00)
GFR calc Af Amer: >60 mL/min (ref >60)
GFR calc non Af Amer: >60 mL/min (ref >60)
Glucose, Bld: 91 mg/dL (ref 70–99)
Potassium: 3.6 mmol/L (ref 3.5–5.1)
Sodium: 138 mmol/L (ref 135–145)

## 2019-08-11 LAB — CBC
HCT: 45 % (ref 36.0–46.0)
Hemoglobin: 14.2 g/dL (ref 12.0–15.0)
MCH: 30.5 pg (ref 26.0–34.0)
MCHC: 31.6 g/dL (ref 30.0–36.0)
MCV: 96.6 fL (ref 80.0–100.0)
Platelets: 181 10*3/uL (ref 150–400)
RBC: 4.66 MIL/uL (ref 3.87–5.11)
RDW: 12.8 % (ref 11.5–15.5)
WBC: 5.3 10*3/uL (ref 4.0–10.5)
nRBC: 0 % (ref 0.0–0.2)

## 2019-08-11 LAB — MAGNESIUM: Magnesium: 1.8 mg/dL (ref 1.7–2.4)

## 2019-08-11 MED ORDER — ENOXAPARIN SODIUM 80 MG/0.8ML ~~LOC~~ SOLN
80.0000 mg | SUBCUTANEOUS | Status: DC
  Administered 2019-08-11 – 2019-08-14 (×4): 80 mg via SUBCUTANEOUS
  Filled 2019-08-11 (×4): qty 0.8

## 2019-08-11 MED ORDER — SODIUM CHLORIDE (PF) 0.9 % IJ SOLN
INTRAMUSCULAR | Status: AC
  Filled 2019-08-11: qty 50

## 2019-08-11 MED ORDER — IOHEXOL 300 MG/ML  SOLN
100.0000 mL | Freq: Once | INTRAMUSCULAR | Status: AC | PRN
  Administered 2019-08-11: 100 mL via INTRAVENOUS

## 2019-08-11 NOTE — Progress Notes (Signed)
[facility] Progress Note     Subjective: CC-  Continues to complain of lower abdominal pain. May be slightly less than yesterday. Denies any n/v. Taking in few liquids. Loose BMs have stopped. Passing flatus. WBC 5.3, afebrile  Objective: Vital signs in last 24 hours: Temp:  [97.8 F (36.6 C)-98.6 F (37 C)] 97.8 F (36.6 C) (10/29 0430) Pulse Rate:  [58-77] 58 (10/29 0430) Resp:  [16-18] 16 (10/29 0430) BP: (116-143)/(77-104) 116/77 (10/29 0430) SpO2:  [94 %-98 %] 98 % (10/29 0430) Last BM Date: 08/10/19  Intake/Output from previous day: 10/28 0701 - 10/29 0700 In: 3216.4 [P.O.:1260; I.V.:1756.3; IV Piggyback:200.1] Out: -  Intake/Output this shift: No intake/output data recorded.  PE: Gen:  Alert, NAD, pleasant HEENT: EOM's intact, pupils equal and round Card:  RRR Pulm:  Rate and effort normal Abd: obese, soft, no HSM, +BS, TTP suprapubic region and LLQ with no rebound or guarding Psych: A&Ox3  Skin: no rashes noted, warm and dry   Lab Results:  Recent Labs    08/10/19 0252 08/11/19 0317  WBC 5.4 5.3  HGB 13.9 14.2  HCT 43.8 45.0  PLT 188 181   BMET Recent Labs    08/10/19 0252 08/11/19 0317  NA 139 138  K 3.3* 3.6  CL 107 105  CO2 24 23  GLUCOSE 83 91  BUN 5* <5*  CREATININE 0.76 0.73  CALCIUM 8.7* 8.8*   PT/INR Recent Labs    08/09/19 0315  LABPROT 13.7  INR 1.1   CMP     Component Value Date/Time   NA 138 08/11/2019 0317   NA 141 12/09/2016 1113   K 3.6 08/11/2019 0317   CL 105 08/11/2019 0317   CO2 23 08/11/2019 0317   GLUCOSE 91 08/11/2019 0317   BUN <5 (L) 08/11/2019 0317   BUN 10 12/09/2016 1113   CREATININE 0.73 08/11/2019 0317   CREATININE 0.84 10/20/2016 1205   CALCIUM 8.8 (L) 08/11/2019 0317   PROT 8.8 (H) 08/06/2019 1924   PROT 7.8 11/12/2016 1042   ALBUMIN 4.4 08/06/2019 1924   ALBUMIN 4.0 11/12/2016 1042   AST 33 08/06/2019 1924   ALT 27 08/06/2019 1924   ALKPHOS 84 08/06/2019 1924   BILITOT 0.7  08/06/2019 1924   BILITOT 0.6 11/12/2016 1042   GFRNONAA >60 08/11/2019 0317   GFRNONAA >89 10/20/2016 1205   GFRAA >60 08/11/2019 0317   GFRAA >89 10/20/2016 1205   Lipase     Component Value Date/Time   LIPASE 23 08/06/2019 1924       Studies/Results: No results found.  Anti-infectives: Anti-infectives (From admission, onward)   Start     Dose/Rate Route Frequency Ordered Stop   08/08/19 1800  piperacillin-tazobactam (ZOSYN) IVPB 3.375 g     3.375 g 12.5 mL/hr over 240 Minutes Intravenous Every 8 hours 08/08/19 1625     08/07/19 1000  cefTRIAXone (ROCEPHIN) 2 g in sodium chloride 0.9 % 100 mL IVPB  Status:  Discontinued     2 g 200 mL/hr over 30 Minutes Intravenous Every 24 hours 08/07/19 0751 08/08/19 1625   08/07/19 0100  metroNIDAZOLE (FLAGYL) IVPB 500 mg  Status:  Discontinued     500 mg 100 mL/hr over 60 Minutes Intravenous 2 times daily 08/07/19 0042 08/08/19 1625   08/07/19 0000  piperacillin-tazobactam (ZOSYN) IVPB 3.375 g     3.375 g 100 mL/hr over 30 Minutes Intravenous  Once 08/06/19 2347 08/07/19 0200       Assessment/Plan Hypothyroidism  Obesity, BMI 54.87 Trichomonas Family h/o colon cancer in father  Diverticulitis with microperforation and phlegmon - second attack documented in the past 12 months - CT scan 10/24 shows acute diverticulitis of the sigmoid colon with 4 x 3.8 x 4.5 cm rim enhancing collection extending from the colonic wall with surrounding phlegmonous change in the left lower quadrant and deep pelvis concerning for a contained perforation or abscess formation; eactive bladder wall thickening and perivesicular stranding/features of cystitis raise suspicion for possible colovesicular fistulization; stranding and hyperemia of the left ovary  - IR unable to drain fluid collection - If this resolves without surgery this admission she will need a colonoscopy in 6-8 weeks.  ID - zosyn 10/25>> FEN - IVF, CLD ADAT to D1 diet VTE - SCDs,  lovenox Foley - none Follow up - TBD  Plan: Repeat CT scan today due to persistent abdominal pain. Continue IV zosyn and liquids.   LOS: 4 days    [facility] 08/11/2019, 8:59 AM Please see Amion for pager number during day hours 7:00am-4:30pm

## 2019-08-11 NOTE — Plan of Care (Signed)

## 2019-08-11 NOTE — Progress Notes (Signed)
PROGRESS NOTE    Lori Mcknight  UYQ:[id number] DOB: 27-Nov-1985 DOA: 08/06/2019 PCP: Claiborne Rigg, NP   Brief Narrative: Lori Mcknight is a 33 y.o. female with a history of asthma, anxiety, obesity, PCOS, hypothyroidism. Patient presented secondary to abdominal pain and found to have diverticulitis with microperforation and phlegmon. She was started on IV antibiotics and general surgery consulted.   Assessment & Plan:   Principal Problem:   Diverticulitis of intestine with abscess Active Problems:   PCOS (polycystic ovarian syndrome)   Essential hypertension   Hypothyroid   Obesity, morbid, BMI 50 or higher (HCC)   Endometriosis s/p ablation   Dysuria   Family history of colon cancer in father   Diverticulitis with microperforation and pheglmon General surgery consulted. Plan for IR drainage/aspiration, however, they recommended no drainage at the time. Patient has been managed on IV Zosyn. Symptoms are stable. No blood cultures. Repeat CT significant for persistent changes of diverticulitis -General surgery recommendations: IV antibiotics -Continue IV Zosyn  Trichomonas Treated with Flagyl.  Hypothyroidism -Continue Synthroid  Obesity Body mass index is 54.87 kg/m.  Hypokalemia Replete as needed  DVT prophylaxis: Lovenox Code Status:   Code Status: Full Code Family Communication: None at bedside Disposition Plan: Discharge home pending general surgery recommendations   Consultants:   General surgery  Interventional radiology  Procedures:   None  Antimicrobials:  Zosyn    Subjective: Feeling better today.  Objective: Vitals:   08/10/19 0857 08/10/19 1437 08/10/19 2101 08/11/19 0430  BP:  (!) 143/78 (!) 143/104 116/77  Pulse:  61 77 (!) 58  Resp:  18 18 16   Temp:  98.6 F (37 C) 98.4 F (36.9 C) 97.8 F (36.6 C)  TempSrc:  Oral Oral Oral  SpO2: 96% 94% 98% 98%  Weight:      Height:        Intake/Output Summary (Last 24 hours) at  08/11/2019 1829 Last data filed at 08/11/2019 0600 Gross per 24 hour  Intake 1346.38 ml  Output --  Net 1346.38 ml   Filed Weights   08/06/19 1812 08/06/19 1816  Weight: (!) 154.2 kg (!) 154.2 kg    Examination:  General exam: Appears calm and comfortable Respiratory system: Clear to auscultation. Respiratory effort normal. Cardiovascular system: S1 & S2 heard, RRR. No murmurs, rubs, gallops or clicks. Gastrointestinal system: Abdomen is nondistended, soft and nontender. No organomegaly or masses felt. Normal bowel sounds heard. Central nervous system: Alert and oriented. No focal neurological deficits. Extremities: No edema. No calf tenderness Skin: No cyanosis. No rashes Psychiatry: Judgement and insight appear normal. Mood & affect appropriate.     Data Reviewed: I have personally reviewed following labs and imaging studies  CBC: Recent Labs  Lab 08/07/19 0816 08/08/19 0251 08/09/19 0315 08/10/19 0252 08/11/19 0317  WBC 7.0 6.6 5.2 5.4 5.3  HGB 14.1 13.6 13.6 13.9 14.2  HCT 43.9 42.5 43.3 43.8 45.0  MCV 96.5 97.7 97.1 97.6 96.6  PLT 212 194 183 188 181   Basic Metabolic Panel: Recent Labs  Lab 08/07/19 0816 08/08/19 0251 08/09/19 0315 08/10/19 0252 08/11/19 0317  NA 138 137 139 139 138  K 3.3* 3.4* 3.2* 3.3* 3.6  CL 107 106 104 107 105  CO2 23 23 24 24 23   GLUCOSE 97 82 63* 83 91  BUN 7 6 <5* 5* <5*  CREATININE 0.76 0.72 0.72 0.76 0.73  CALCIUM 8.7* 8.6* 8.5* 8.7* 8.8*  MG 1.9 1.8 1.9 1.8 1.8  GFR: Estimated Creatinine Clearance: 152.2 mL/min (by C-G formula based on SCr of 0.73 mg/dL). Liver Function Tests: Recent Labs  Lab 08/06/19 1924  AST 33  ALT 27  ALKPHOS 84  BILITOT 0.7  PROT 8.8*  ALBUMIN 4.4   Recent Labs  Lab 08/06/19 1924  LIPASE 23   No results for input(s): AMMONIA in the last 168 hours. Coagulation Profile: Recent Labs  Lab 08/09/19 0315  INR 1.1   Cardiac Enzymes: No results for input(s): CKTOTAL, CKMB,  CKMBINDEX, TROPONINI in the last 168 hours. BNP (last 3 results) No results for input(s): PROBNP in the last 8760 hours. HbA1C: No results for input(s): HGBA1C in the last 72 hours. CBG: No results for input(s): GLUCAP in the last 168 hours. Lipid Profile: No results for input(s): CHOL, HDL, LDLCALC, TRIG, CHOLHDL, LDLDIRECT in the last 72 hours. Thyroid Function Tests: No results for input(s): TSH, T4TOTAL, FREET4, T3FREE, THYROIDAB in the last 72 hours. Anemia Panel: No results for input(s): VITAMINB12, FOLATE, FERRITIN, TIBC, IRON, RETICCTPCT in the last 72 hours. Sepsis Labs: No results for input(s): PROCALCITON, LATICACIDVEN in the last 168 hours.  Recent Results (from the past 240 hour(s))  SARS CORONAVIRUS 2 (TAT 6-24 HRS) Nasopharyngeal Nasopharyngeal Swab     Status: None   Collection Time: 08/07/19  1:03 AM   Specimen: Nasopharyngeal Swab  Result Value Ref Range Status   SARS Coronavirus 2 NEGATIVE NEGATIVE Final    Comment: (NOTE) SARS-CoV-2 target nucleic acids are NOT DETECTED. The SARS-CoV-2 RNA is generally detectable in upper and lower respiratory specimens during the acute phase of infection. Negative results do not preclude SARS-CoV-2 infection, do not rule out co-infections with other pathogens, and should not be used as the sole basis for treatment or other patient management decisions. Negative results must be combined with clinical observations, patient history, and epidemiological information. The expected result is Negative. Fact Sheet for Patients: SugarRoll.be Fact Sheet for Healthcare Providers: https://www.woods-mathews.com/ This test is not yet approved or cleared by the [place] FDA and  has been authorized for detection and/or diagnosis of SARS-CoV-2 by FDA under an Emergency Use Authorization (EUA). This EUA will remain  in effect (meaning this test can be used) for the duration of the COVID-19  declaration under Section 56 4(b)(1) of the Act, 21 U.S.C. section 360bbb-3(b)(1), unless the authorization is terminated or revoked sooner. Performed at [facility], [place] [address], [place], [place] [address]          Radiology Studies: Ct Abdomen Pelvis W Contrast  Result Date: 08/11/2019 CLINICAL DATA:  Diverticulitis, persistent abdominal pain EXAM: CT ABDOMEN AND PELVIS WITH CONTRAST TECHNIQUE: Multidetector CT imaging of the abdomen and pelvis was performed using the standard protocol following bolus administration of intravenous contrast. CONTRAST:  149mL OMNIPAQUE IOHEXOL 300 MG/ML  SOLN COMPARISON:  August 06, 2019 FINDINGS: Lower chest: Lung bases are clear. Hepatobiliary: No focal liver abnormality is seen. No gallstones, gallbladder wall thickening, or biliary dilatation. Pancreas: Unremarkable. No pancreatic ductal dilatation or surrounding inflammatory changes. Spleen: Normal in size without focal abnormality. Adrenals/Urinary Tract: Adrenal glands are unremarkable. Kidneys are normal, without renal calculi, focal lesion, or hydronephrosis. There is bladder wall thickening due to underdistention and likely reactive change adjacent to the area of colonic inflammation. There is no intraluminal air. Stomach/Bowel: Similar appearance of segmental wall thickening and surrounding inflammatory changes involving the sigmoid colon. The previously seen adjacent extraluminal collection is similar in size with less apparent central low-attenuation. Stomach is  within normal limits. Vascular/Lymphatic: No significant vascular findings are present. No enlarged abdominal or pelvic lymph nodes. Reproductive: Uterus and bilateral adnexa are unremarkable. Other: Similar free-fluid in pelvis. Musculoskeletal: Degenerate changes at L5-S1. IMPRESSION: Persistent changes of sigmoid diverticulitis with similar size of small adjacent collection. As noted previously, the bladder abuts this region with  wall thickening but no intraluminal air to definitively suggest fistula formation. Electronically Signed   By: Guadlupe SpanishPraneil  Patel M.D.   On: 08/11/2019 15:35        Scheduled Meds:  enoxaparin (LOVENOX) injection  80 mg Subcutaneous Q24H   feeding supplement  237 mL Oral BID BM   levothyroxine  75 mcg Oral Q0600   lip balm  1 application Topical BID   psyllium  1 packet Oral Daily   saccharomyces boulardii  250 mg Oral BID   sodium chloride (PF)       Continuous Infusions:  lactated ringers 75 mL/hr at 08/11/19 0147   methocarbamol (ROBAXIN) IV     piperacillin-tazobactam (ZOSYN)  IV 3.375 g (08/11/19 1011)     LOS: 4 days     Jacquelin Hawkingalph Draden Cottingham, MD [facility] 08/11/2019, 6:28 PM  If 7PM-7AM, please contact night-coverage [email or web address]

## 2019-08-11 NOTE — Progress Notes (Signed)
Repeat CT scan shows slight resolution and less progression of her sigmoid colon wall abscess.  Still inflammation near the bladder but improved.  No evidence of fistula.  Tolerating p.o. better today. Continue IV antibiotics We will follow clinically. If feels worse, may switch to meropenem.  Probably less likely.      CLINICAL DATA:  Diverticulitis, persistent abdominal pain  EXAM: CT ABDOMEN AND PELVIS WITH CONTRAST  TECHNIQUE: Multidetector CT imaging of the abdomen and pelvis was performed using the standard protocol following bolus administration of intravenous contrast.  CONTRAST:  182mL OMNIPAQUE IOHEXOL 300 MG/ML  SOLN  COMPARISON:  August 06, 2019  FINDINGS: Lower chest: Lung bases are clear.  Hepatobiliary: No focal liver abnormality is seen. No gallstones, gallbladder wall thickening, or biliary dilatation.  Pancreas: Unremarkable. No pancreatic ductal dilatation or surrounding inflammatory changes.  Spleen: Normal in size without focal abnormality.  Adrenals/Urinary Tract: Adrenal glands are unremarkable. Kidneys are normal, without renal calculi, focal lesion, or hydronephrosis. There is bladder wall thickening due to underdistention and likely reactive change adjacent to the area of colonic inflammation. There is no intraluminal air.  Stomach/Bowel: Similar appearance of segmental wall thickening and surrounding inflammatory changes involving the sigmoid colon. The previously seen adjacent extraluminal collection is similar in size with less apparent central low-attenuation. Stomach is within normal limits.  Vascular/Lymphatic: No significant vascular findings are present. No enlarged abdominal or pelvic lymph nodes.  Reproductive: Uterus and bilateral adnexa are unremarkable.  Other: Similar free-fluid in pelvis.  Musculoskeletal: Degenerate changes at L5-S1.  IMPRESSION: Persistent changes of sigmoid diverticulitis with  similar size of small adjacent collection. As noted previously, the bladder abuts this region with wall thickening but no intraluminal air to definitively suggest fistula formation.   Electronically Signed   By: Macy Mis M.D.   On: 08/11/2019 15:35

## 2019-08-11 NOTE — Plan of Care (Signed)

## 2019-08-12 LAB — MAGNESIUM: Magnesium: 1.8 mg/dL (ref 1.7–2.4)

## 2019-08-12 MED ORDER — KETOROLAC TROMETHAMINE 30 MG/ML IJ SOLN: 30.0000 mg | Freq: Four times a day (QID) | INTRAMUSCULAR | Status: AC | PRN

## 2019-08-12 MED ORDER — FAMOTIDINE 20 MG PO TABS: 20.0000 mg | ORAL_TABLET | Freq: Every day | ORAL | Status: DC | PRN

## 2019-08-12 MED ORDER — ACETAMINOPHEN 500 MG PO TABS
1000.0000 mg | ORAL_TABLET | Freq: Four times a day (QID) | ORAL | Status: DC | PRN
  Administered 2019-08-13: 1000 mg via ORAL
  Filled 2019-08-12: qty 2

## 2019-08-12 MED ORDER — LACTATED RINGERS IV BOLUS: 1000.0000 mL | Freq: Three times a day (TID) | INTRAVENOUS | Status: AC | PRN

## 2019-08-12 MED ORDER — SODIUM CHLORIDE 0.9% FLUSH: 3.0000 mL | INTRAVENOUS | Status: DC | PRN

## 2019-08-12 MED ORDER — SODIUM CHLORIDE 0.9 % IV SOLN: 250.0000 mL | INTRAVENOUS | Status: DC | PRN

## 2019-08-12 MED ORDER — SODIUM CHLORIDE 0.9% FLUSH
3.0000 mL | Freq: Two times a day (BID) | INTRAVENOUS | Status: DC
  Administered 2019-08-12 – 2019-08-13 (×3): 3 mL via INTRAVENOUS

## 2019-08-12 NOTE — Progress Notes (Signed)
PROGRESS NOTE    Lori C Brigante  LGX:[id number] DOB: 05-15-1986 DOA: 08/06/2019 PCP: Claiborne Rigg, NP   Brief Narrative: Lori Mcknight is a 33 y.o. female with a history of asthma, anxiety, obesity, PCOS, hypothyroidism. Patient presented secondary to abdominal pain and found to have diverticulitis with microperforation and phlegmon. She was started on IV antibiotics and general surgery consulted.   Assessment & Plan:   Principal Problem:   Diverticulitis of intestine with abscess Active Problems:   PCOS (polycystic ovarian syndrome)   Essential hypertension   Hypothyroid   Obesity, morbid, BMI 50 or higher (HCC)   Endometriosis s/p ablation   Dysuria   Family history of colon cancer in father   Diverticulitis with microperforation and pheglmon General surgery consulted. Plan for IR drainage/aspiration, however, they recommended no drainage at the time. Patient has been managed on IV Zosyn. Symptoms are stable. No blood cultures. Repeat CT significant for persistent changes of diverticulitis -General surgery recommendations: IV antibiotics, advancing diet -Continue IV Zosyn  Trichomonas Treated with Flagyl.  Hypothyroidism -Continue Synthroid  Obesity Body mass index is 54.87 kg/m.  Hypokalemia Replete as needed  DVT prophylaxis: Lovenox Code Status:   Code Status: Full Code Family Communication: None at bedside Disposition Plan: Discharge home pending general surgery recommendations   Consultants:   General surgery  Interventional radiology  Procedures:   None  Antimicrobials:  Zosyn    Subjective: Continues to feel okay. Pain improved.  Objective: Vitals:   08/11/19 0430 08/11/19 2145 08/12/19 0542 08/12/19 1359  BP: 116/77 107/62 (!) 148/93 115/73  Pulse: (!) 58 78 60 76  Resp: 16 20 16 14   Temp: 97.8 F (36.6 C) 97.6 F (36.4 C) (!) 97.4 F (36.3 C) 98 F (36.7 C)  TempSrc: Oral Oral Oral Oral  SpO2: 98% 93% 97% 98%  Weight:        Height:        Intake/Output Summary (Last 24 hours) at 08/12/2019 1426 Last data filed at 08/12/2019 1400 Gross per 24 hour  Intake 1498.48 ml  Output --  Net 1498.48 ml   Filed Weights   08/06/19 1812 08/06/19 1816  Weight: (!) 154.2 kg (!) 154.2 kg    Examination:  General exam: Appears calm and comfortable Respiratory system: Clear to auscultation. Respiratory effort normal. Cardiovascular system: S1 & S2 heard, RRR. No murmurs, rubs, gallops or clicks. Gastrointestinal system: Abdomen is nondistended, soft and nontender. No organomegaly or masses felt. Normal bowel sounds heard. Central nervous system: Alert and oriented. No focal neurological deficits. Extremities: No edema. No calf tenderness Skin: No cyanosis. No rashes Psychiatry: Judgement and insight appear normal. Mood & affect appropriate.    Data Reviewed: I have personally reviewed following labs and imaging studies  CBC: Recent Labs  Lab 08/07/19 0816 08/08/19 0251 08/09/19 0315 08/10/19 0252 08/11/19 0317  WBC 7.0 6.6 5.2 5.4 5.3  HGB 14.1 13.6 13.6 13.9 14.2  HCT 43.9 42.5 43.3 43.8 45.0  MCV 96.5 97.7 97.1 97.6 96.6  PLT 212 194 183 188 181   Basic Metabolic Panel: Recent Labs  Lab 08/07/19 0816 08/08/19 0251 08/09/19 0315 08/10/19 0252 08/11/19 0317 08/12/19 0233  NA 138 137 139 139 138  --   K 3.3* 3.4* 3.2* 3.3* 3.6  --   CL 107 106 104 107 105  --   CO2 23 23 24 24 23   --   GLUCOSE 97 82 63* 83 91  --   BUN 7 6 <5*  5* <5*  --   CREATININE 0.76 0.72 0.72 0.76 0.73  --   CALCIUM 8.7* 8.6* 8.5* 8.7* 8.8*  --   MG 1.9 1.8 1.9 1.8 1.8 1.8   GFR: Estimated Creatinine Clearance: 152.2 mL/min (by C-G formula based on SCr of 0.73 mg/dL). Liver Function Tests: Recent Labs  Lab 08/06/19 1924  AST 33  ALT 27  ALKPHOS 84  BILITOT 0.7  PROT 8.8*  ALBUMIN 4.4   Recent Labs  Lab 08/06/19 1924  LIPASE 23   No results for input(s): AMMONIA in the last 168 hours. Coagulation  Profile: Recent Labs  Lab 08/09/19 0315  INR 1.1   Cardiac Enzymes: No results for input(s): CKTOTAL, CKMB, CKMBINDEX, TROPONINI in the last 168 hours. BNP (last 3 results) No results for input(s): PROBNP in the last 8760 hours. HbA1C: No results for input(s): HGBA1C in the last 72 hours. CBG: No results for input(s): GLUCAP in the last 168 hours. Lipid Profile: No results for input(s): CHOL, HDL, LDLCALC, TRIG, CHOLHDL, LDLDIRECT in the last 72 hours. Thyroid Function Tests: No results for input(s): TSH, T4TOTAL, FREET4, T3FREE, THYROIDAB in the last 72 hours. Anemia Panel: No results for input(s): VITAMINB12, FOLATE, FERRITIN, TIBC, IRON, RETICCTPCT in the last 72 hours. Sepsis Labs: No results for input(s): PROCALCITON, LATICACIDVEN in the last 168 hours.  Recent Results (from the past 240 hour(s))  SARS CORONAVIRUS 2 (TAT 6-24 HRS) Nasopharyngeal Nasopharyngeal Swab     Status: None   Collection Time: 08/07/19  1:03 AM   Specimen: Nasopharyngeal Swab  Result Value Ref Range Status   SARS Coronavirus 2 NEGATIVE NEGATIVE Final    Comment: (NOTE) SARS-CoV-2 target nucleic acids are NOT DETECTED. The SARS-CoV-2 RNA is generally detectable in upper and lower respiratory specimens during the acute phase of infection. Negative results do not preclude SARS-CoV-2 infection, do not rule out co-infections with other pathogens, and should not be used as the sole basis for treatment or other patient management decisions. Negative results must be combined with clinical observations, patient history, and epidemiological information. The expected result is Negative. Fact Sheet for Patients: HairSlick.nohttps://www.fda.gov/media/138098/download Fact Sheet for Healthcare Providers: quierodirigir.comhttps://www.fda.gov/media/138095/download This test is not yet approved or cleared by the [place] FDA and  has been authorized for detection and/or diagnosis of SARS-CoV-2 by FDA under an Emergency Use  Authorization (EUA). This EUA will remain  in effect (meaning this test can be used) for the duration of the COVID-19 declaration under Section 56 4(b)(1) of the Act, 21 U.S.C. section 360bbb-3(b)(1), unless the authorization is terminated or revoked sooner. Performed at [facility], [address] [place], [place] [address]          Radiology Studies: Ct Abdomen Pelvis W Contrast  Result Date: 08/11/2019 CLINICAL DATA:  Diverticulitis, persistent abdominal pain EXAM: CT ABDOMEN AND PELVIS WITH CONTRAST TECHNIQUE: Multidetector CT imaging of the abdomen and pelvis was performed using the standard protocol following bolus administration of intravenous contrast. CONTRAST:  100mL OMNIPAQUE IOHEXOL 300 MG/ML  SOLN COMPARISON:  August 06, 2019 FINDINGS: Lower chest: Lung bases are clear. Hepatobiliary: No focal liver abnormality is seen. No gallstones, gallbladder wall thickening, or biliary dilatation. Pancreas: Unremarkable. No pancreatic ductal dilatation or surrounding inflammatory changes. Spleen: Normal in size without focal abnormality. Adrenals/Urinary Tract: Adrenal glands are unremarkable. Kidneys are normal, without renal calculi, focal lesion, or hydronephrosis. There is bladder wall thickening due to underdistention and likely reactive change adjacent to the area of colonic inflammation. There is  no intraluminal air. Stomach/Bowel: Similar appearance of segmental wall thickening and surrounding inflammatory changes involving the sigmoid colon. The previously seen adjacent extraluminal collection is similar in size with less apparent central low-attenuation. Stomach is within normal limits. Vascular/Lymphatic: No significant vascular findings are present. No enlarged abdominal or pelvic lymph nodes. Reproductive: Uterus and bilateral adnexa are unremarkable. Other: Similar free-fluid in pelvis. Musculoskeletal: Degenerate changes at L5-S1. IMPRESSION: Persistent changes of sigmoid  diverticulitis with similar size of small adjacent collection. As noted previously, the bladder abuts this region with wall thickening but no intraluminal air to definitively suggest fistula formation. Electronically Signed   By: Macy Mis M.D.   On: 08/11/2019 15:35        Scheduled Meds:  enoxaparin (LOVENOX) injection  80 mg Subcutaneous Q24H   feeding supplement  237 mL Oral BID BM   levothyroxine  75 mcg Oral Q0600   lip balm  1 application Topical BID   psyllium  1 packet Oral Daily   saccharomyces boulardii  250 mg Oral BID   sodium chloride flush  3 mL Intravenous Q12H   Continuous Infusions:  sodium chloride     lactated ringers     methocarbamol (ROBAXIN) IV     piperacillin-tazobactam (ZOSYN)  IV Stopped (08/12/19 1354)     LOS: 5 days     Cordelia Poche, MD [facility] 08/12/2019, 2:26 PM  If 7PM-7AM, please contact night-coverage [email or web address]

## 2019-08-12 NOTE — Progress Notes (Signed)
Received report from Kathryn, RN.

## 2019-08-12 NOTE — Progress Notes (Signed)
[facility] Progress Note     Subjective: CC-  Less sore Sleepy Trying pureed / full liquids for breakfast Afebrile  Objective: Vital signs in last 24 hours: Temp:  [97.4 F (36.3 C)-97.6 F (36.4 C)] 97.4 F (36.3 C) (10/30 0542) Pulse Rate:  [60-78] 60 (10/30 0542) Resp:  [16-20] 16 (10/30 0542) BP: (107-148)/(62-93) 148/93 (10/30 0542) SpO2:  [93 %-97 %] 97 % (10/30 0542) Last BM Date: 08/11/19  Intake/Output from previous day: 10/29 0701 - 10/30 0700 In: 1360.1 [P.O.:360; I.V.:900.1; IV Piggyback:100] Out: -  Intake/Output this shift: No intake/output data recorded.  PE: Gen:  Sleeping but awakens in NAD, pleasant HEENT: EOM's intact, pupils equal and round Card:  RRR Pulm:  Rate and effort normal Abd: morbidly obese, soft, no HSM, +BS, TTP suprapubic region and LLQ milder.  No rebound or guarding Psych: A&Ox3  Skin: no rashes noted, warm and dry   Lab Results:  Recent Labs    08/10/19 0252 08/11/19 0317  WBC 5.4 5.3  HGB 13.9 14.2  HCT 43.8 45.0  PLT 188 181   BMET Recent Labs    08/10/19 0252 08/11/19 0317  NA 139 138  K 3.3* 3.6  CL 107 105  CO2 24 23  GLUCOSE 83 91  BUN 5* <5*  CREATININE 0.76 0.73  CALCIUM 8.7* 8.8*   PT/INR No results for input(s): LABPROT, INR in the last 72 hours. CMP     Component Value Date/Time   NA 138 08/11/2019 0317   NA 141 12/09/2016 1113   K 3.6 08/11/2019 0317   CL 105 08/11/2019 0317   CO2 23 08/11/2019 0317   GLUCOSE 91 08/11/2019 0317   BUN <5 (L) 08/11/2019 0317   BUN 10 12/09/2016 1113   CREATININE 0.73 08/11/2019 0317   CREATININE 0.84 10/20/2016 1205   CALCIUM 8.8 (L) 08/11/2019 0317   PROT 8.8 (H) 08/06/2019 1924   PROT 7.8 11/12/2016 1042   ALBUMIN 4.4 08/06/2019 1924   ALBUMIN 4.0 11/12/2016 1042   AST 33 08/06/2019 1924   ALT 27 08/06/2019 1924   ALKPHOS 84 08/06/2019 1924   BILITOT 0.7 08/06/2019 1924   BILITOT 0.6 11/12/2016 1042   GFRNONAA >60 08/11/2019 0317   GFRNONAA >89 10/20/2016 1205   GFRAA >60 08/11/2019 0317   GFRAA >89 10/20/2016 1205   Lipase     Component Value Date/Time   LIPASE 23 08/06/2019 1924       Studies/Results: Ct Abdomen Pelvis W Contrast  Result Date: 08/11/2019 CLINICAL DATA:  Diverticulitis, persistent abdominal pain EXAM: CT ABDOMEN AND PELVIS WITH CONTRAST TECHNIQUE: Multidetector CT imaging of the abdomen and pelvis was performed using the standard protocol following bolus administration of intravenous contrast. CONTRAST:  OMNIPAQUE IOHEXOL 300 MG/ML  SOLN COMPARISON:  August 06, 2019 FINDINGS: Lower chest: Lung bases are clear. Hepatobiliary: No focal liver abnormality is seen. No gallstones, gallbladder wall thickening, or biliary dilatation. Pancreas: Unremarkable. No pancreatic ductal dilatation or surrounding inflammatory changes. Spleen: Normal in size without focal abnormality. Adrenals/Urinary Tract: Adrenal glands are unremarkable. Kidneys are normal, without renal calculi, focal lesion, or hydronephrosis. There is bladder wall thickening due to underdistention and likely reactive change adjacent to the area of colonic inflammation. There is no intraluminal air. Stomach/Bowel: Similar appearance of segmental wall thickening and surrounding inflammatory changes involving the sigmoid colon. The previously seen adjacent extraluminal collection is similar in size with less apparent central low-attenuation. Stomach is within normal limits. Vascular/Lymphatic: No significant vascular findings  are present. No enlarged abdominal or pelvic lymph nodes. Reproductive: Uterus and bilateral adnexa are unremarkable. Other: Similar free-fluid in pelvis. Musculoskeletal: Degenerate changes at L5-S1. IMPRESSION: Persistent changes of sigmoid diverticulitis with similar size of small adjacent collection. As noted previously, the bladder abuts this region with wall thickening but no intraluminal air to definitively suggest fistula  formation. Electronically Signed   By: Macy Mis M.D.   On: 08/11/2019 15:35    Anti-infectives: Anti-infectives (From admission, onward)   Start     Dose/Rate Route Frequency Ordered Stop   08/08/19 1800  piperacillin-tazobactam (ZOSYN) IVPB 3.375 g     3.375 g 12.5 mL/hr over 240 Minutes Intravenous Every 8 hours 08/08/19 1625     08/07/19 1000  cefTRIAXone (ROCEPHIN) 2 g in sodium chloride 0.9 % 100 mL IVPB  Status:  Discontinued     2 g 200 mL/hr over 30 Minutes Intravenous Every 24 hours 08/07/19 0751 08/08/19 1625   08/07/19 0100  metroNIDAZOLE (FLAGYL) IVPB 500 mg  Status:  Discontinued     500 mg 100 mL/hr over 60 Minutes Intravenous 2 times daily 08/07/19 0042 08/08/19 1625   08/07/19 0000  piperacillin-tazobactam (ZOSYN) IVPB 3.375 g     3.375 g 100 mL/hr over 30 Minutes Intravenous  Once 08/06/19 2347 08/07/19 0200       Assessment/Plan Hypothyroidism Obesity, BMI 54.87 Trichomonas Family h/o colon cancer in father  Diverticulitis with microperforation and phlegmon - second attack documented in the past 12 months - CT scan 10/24:  shows acute diverticulitis of the sigmoid colon with 4 x 3.8 x 4.5 cm rim enhancing collection extending from the colonic wall with surrounding phlegmonous change in the left lower quadrant and deep pelvis concerning for a contained perforation or abscess formation; reactive bladder wall thickening and perivesicular stranding/features of cystitis raise suspicion for possible colovesicular fistulization; stranding and hyperemia of the left ovary  - CT scan 10/29 improveed w less inflammation & smaller abscess - IR unable to drain fluid collection - If this resolves without surgery this admission she will need a colonoscopy in 6-8 weeks.  ID - zosyn 10/25>> FEN - IVF, CLD ADAT to soft diet VTE - SCDs, lovenox Foley - none Follow up - TBD  Plan:   Continue ABx x 10 d total (can switch to 5 more days Augmentin upon d/c) Adv diet  to soft F/u CBC in AM  D/C patient from hospital when patient meets criteria (anticipate in 1-2 day(s)):  Tolerating oral intake well Ambulating well Adequate pain control without IV medications Urinating  Having flatus     LOS: 5 days   [name], MD, FACS, MASCRS Gastrointestinal and Minimally Invasive Surgery  [facility] [address], [place], [place] [address]       08/12/2019, 8:34 AM Please see Amion for pager number during day hours 7:00am-4:30pm

## 2019-08-13 LAB — CBC
HCT: 43.4 % (ref 36.0–46.0)
Hemoglobin: 13.7 g/dL (ref 12.0–15.0)
MCH: 30.8 pg (ref 26.0–34.0)
MCHC: 31.6 g/dL (ref 30.0–36.0)
MCV: 97.5 fL (ref 80.0–100.0)
Platelets: 206 10*3/uL (ref 150–400)
RBC: 4.45 MIL/uL (ref 3.87–5.11)
RDW: 12.8 % (ref 11.5–15.5)
WBC: 5.3 10*3/uL (ref 4.0–10.5)
nRBC: 0 % (ref 0.0–0.2)

## 2019-08-13 LAB — CREATININE, SERUM
Creatinine, Ser: 0.91 mg/dL (ref 0.44–1.00)
GFR calc Af Amer: >60 mL/min (ref >60)
GFR calc non Af Amer: >60 mL/min (ref >60)

## 2019-08-13 LAB — POTASSIUM: Potassium: 3.3 mmol/L — ABNORMAL LOW (ref 3.5–5.1)

## 2019-08-13 MED ORDER — POTASSIUM CHLORIDE CRYS ER 20 MEQ PO TBCR
40.0000 meq | EXTENDED_RELEASE_TABLET | Freq: Once | ORAL | Status: AC
  Administered 2019-08-13: 40 meq via ORAL
  Filled 2019-08-13: qty 2

## 2019-08-13 MED ORDER — AMOXICILLIN-POT CLAVULANATE 875-125 MG PO TABS
1.0000 | ORAL_TABLET | Freq: Two times a day (BID) | ORAL | Status: DC
  Administered 2019-08-13 – 2019-08-14 (×3): 1 via ORAL
  Filled 2019-08-13 (×3): qty 1

## 2019-08-13 NOTE — Plan of Care (Signed)

## 2019-08-13 NOTE — Progress Notes (Signed)
PROGRESS NOTE    Lori C Maloof  ZOX:[id number] DOB: 1986/02/04 DOA: 08/06/2019 PCP: Claiborne RiggFleming, Zelda W, NP   Brief Narrative: Lori Mcknight is a 33 y.o. female with a history of asthma, anxiety, obesity, PCOS, hypothyroidism. Patient presented secondary to abdominal pain and found to have diverticulitis with microperforation and phlegmon. She was started on IV antibiotics and general surgery consulted.   Assessment & Plan:   Principal Problem:   Diverticulitis of intestine with abscess Active Problems:   PCOS (polycystic ovarian syndrome)   Essential hypertension   Hypothyroid   Obesity, morbid, BMI 50 or higher (HCC)   Endometriosis s/p ablation   Dysuria   Family history of colon cancer in father   Diverticulitis with microperforation and pheglmon General surgery consulted. Plan for IR drainage/aspiration, however, they recommended no drainage at the time. Patient has been managed on IV Zosyn. Symptoms are stable. No blood cultures. Repeat CT significant for persistent changes of diverticulitis -General surgery recommendations: advancing diet, transitioned antibiotics to Augmentin  Trichomonas Treated with Flagyl.  Hypothyroidism -Continue Synthroid  Obesity Body mass index is 54.87 kg/m.  Hypokalemia Replete as needed  DVT prophylaxis: Lovenox Code Status:   Code Status: Full Code Family Communication: None at bedside Disposition Plan: Discharge home pending general surgery recommendations   Consultants:   General surgery  Interventional radiology  Procedures:   None  Antimicrobials:  Zosyn    Subjective: No abdominal pain. Some reflux yesterday that resolved with Maalox  Objective: Vitals:   08/12/19 0542 08/12/19 1359 08/12/19 2054 08/13/19 0600  BP: (!) 148/93 115/73 122/72 109/64  Pulse: 60 76 69 69  Resp: 16 14 18 16   Temp: (!) 97.4 F (36.3 C) 98 F (36.7 C) 97.7 F (36.5 C) 97.8 F (36.6 C)  TempSrc: Oral Oral    SpO2: 97% 98% 100%  96%  Weight:      Height:        Intake/Output Summary (Last 24 hours) at 08/13/2019 1107 Last data filed at 08/13/2019 0553 Gross per 24 hour  Intake 598.07 ml  Output --  Net 598.07 ml   Filed Weights   08/06/19 1812 08/06/19 1816  Weight: (!) 154.2 kg (!) 154.2 kg    Examination:  General exam: Appears calm and comfortable Respiratory system: Clear to auscultation. Respiratory effort normal. Cardiovascular system: S1 & S2 heard, RRR. No murmurs, rubs, gallops or clicks. Gastrointestinal system: Abdomen is nondistended, soft and nontender. No organomegaly or masses felt. Normal bowel sounds heard. Central nervous system: Alert and oriented. No focal neurological deficits. Extremities: No edema. No calf tenderness Skin: No cyanosis. No rashes Psychiatry: Judgement and insight appear normal. Mood & affect appropriate.    Data Reviewed: I have personally reviewed following labs and imaging studies  CBC: Recent Labs  Lab 08/08/19 0251 08/09/19 0315 08/10/19 0252 08/11/19 0317 08/13/19 0033  WBC 6.6 5.2 5.4 5.3 5.3  HGB 13.6 13.6 13.9 14.2 13.7  HCT 42.5 43.3 43.8 45.0 43.4  MCV 97.7 97.1 97.6 96.6 97.5  PLT 194 183 188 181 206   Basic Metabolic Panel: Recent Labs  Lab 08/07/19 0816 08/08/19 0251 08/09/19 0315 08/10/19 0252 08/11/19 0317 08/12/19 0233 08/13/19 0033  NA 138 137 139 139 138  --   --   K 3.3* 3.4* 3.2* 3.3* 3.6  --  3.3*  CL 107 106 104 107 105  --   --   CO2 23 23 24 24 23   --   --   GLUCOSE  97 82 63* 83 91  --   --   BUN 7 6 <5* 5* <5*  --   --   CREATININE 0.76 0.72 0.72 0.76 0.73  --  0.91  CALCIUM 8.7* 8.6* 8.5* 8.7* 8.8*  --   --   MG 1.9 1.8 1.9 1.8 1.8 1.8  --    GFR: Estimated Creatinine Clearance: 133.8 mL/min (by C-G formula based on SCr of 0.91 mg/dL). Liver Function Tests: Recent Labs  Lab 08/06/19 1924  AST 33  ALT 27  ALKPHOS 84  BILITOT 0.7  PROT 8.8*  ALBUMIN 4.4   Recent Labs  Lab 08/06/19 1924  LIPASE 23    No results for input(s): AMMONIA in the last 168 hours. Coagulation Profile: Recent Labs  Lab 08/09/19 0315  INR 1.1   Cardiac Enzymes: No results for input(s): CKTOTAL, CKMB, CKMBINDEX, TROPONINI in the last 168 hours. BNP (last 3 results) No results for input(s): PROBNP in the last 8760 hours. HbA1C: No results for input(s): HGBA1C in the last 72 hours. CBG: No results for input(s): GLUCAP in the last 168 hours. Lipid Profile: No results for input(s): CHOL, HDL, LDLCALC, TRIG, CHOLHDL, LDLDIRECT in the last 72 hours. Thyroid Function Tests: No results for input(s): TSH, T4TOTAL, FREET4, T3FREE, THYROIDAB in the last 72 hours. Anemia Panel: No results for input(s): VITAMINB12, FOLATE, FERRITIN, TIBC, IRON, RETICCTPCT in the last 72 hours. Sepsis Labs: No results for input(s): PROCALCITON, LATICACIDVEN in the last 168 hours.  Recent Results (from the past 240 hour(s))  SARS CORONAVIRUS 2 (TAT 6-24 HRS) Nasopharyngeal Nasopharyngeal Swab     Status: None   Collection Time: 08/07/19  1:03 AM   Specimen: Nasopharyngeal Swab  Result Value Ref Range Status   SARS Coronavirus 2 NEGATIVE NEGATIVE Final    Comment: (NOTE) SARS-CoV-2 target nucleic acids are NOT DETECTED. The SARS-CoV-2 RNA is generally detectable in upper and lower respiratory specimens during the acute phase of infection. Negative results do not preclude SARS-CoV-2 infection, do not rule out co-infections with other pathogens, and should not be used as the sole basis for treatment or other patient management decisions. Negative results must be combined with clinical observations, patient history, and epidemiological information. The expected result is Negative. Fact Sheet for Patients: SugarRoll.be Fact Sheet for Healthcare Providers: https://www.woods-mathews.com/ This test is not yet approved or cleared by the [place] FDA and  has been authorized for detection  and/or diagnosis of SARS-CoV-2 by FDA under an Emergency Use Authorization (EUA). This EUA will remain  in effect (meaning this test can be used) for the duration of the COVID-19 declaration under Section 56 4(b)(1) of the Act, 21 U.S.C. section 360bbb-3(b)(1), unless the authorization is terminated or revoked sooner. Performed at [facility], [place] [address] [place], [place] [address]          Radiology Studies: Ct Abdomen Pelvis W Contrast  Result Date: 08/11/2019 CLINICAL DATA:  Diverticulitis, persistent abdominal pain EXAM: CT ABDOMEN AND PELVIS WITH CONTRAST TECHNIQUE: Multidetector CT imaging of the abdomen and pelvis was performed using the standard protocol following bolus administration of intravenous contrast. CONTRAST:  175mL OMNIPAQUE IOHEXOL 300 MG/ML  SOLN COMPARISON:  August 06, 2019 FINDINGS: Lower chest: Lung bases are clear. Hepatobiliary: No focal liver abnormality is seen. No gallstones, gallbladder wall thickening, or biliary dilatation. Pancreas: Unremarkable. No pancreatic ductal dilatation or surrounding inflammatory changes. Spleen: Normal in size without focal abnormality. Adrenals/Urinary Tract: Adrenal glands are unremarkable. Kidneys are normal, without renal  calculi, focal lesion, or hydronephrosis. There is bladder wall thickening due to underdistention and likely reactive change adjacent to the area of colonic inflammation. There is no intraluminal air. Stomach/Bowel: Similar appearance of segmental wall thickening and surrounding inflammatory changes involving the sigmoid colon. The previously seen adjacent extraluminal collection is similar in size with less apparent central low-attenuation. Stomach is within normal limits. Vascular/Lymphatic: No significant vascular findings are present. No enlarged abdominal or pelvic lymph nodes. Reproductive: Uterus and bilateral adnexa are unremarkable. Other: Similar free-fluid in pelvis. Musculoskeletal:  Degenerate changes at L5-S1. IMPRESSION: Persistent changes of sigmoid diverticulitis with similar size of small adjacent collection. As noted previously, the bladder abuts this region with wall thickening but no intraluminal air to definitively suggest fistula formation. Electronically Signed   By: Guadlupe Spanish M.D.   On: 08/11/2019 15:35        Scheduled Meds:  amoxicillin-clavulanate  1 tablet Oral Q12H   enoxaparin (LOVENOX) injection  80 mg Subcutaneous Q24H   feeding supplement  237 mL Oral BID BM   levothyroxine  75 mcg Oral Q0600   lip balm  1 application Topical BID   psyllium  1 packet Oral Daily   saccharomyces boulardii  250 mg Oral BID   sodium chloride flush  3 mL Intravenous Q12H   Continuous Infusions:  sodium chloride     lactated ringers     methocarbamol (ROBAXIN) IV       LOS: 6 days     Jacquelin Hawking, MD [facility] 08/13/2019, 11:07 AM  If 7PM-7AM, please contact night-coverage [email or web address]

## 2019-08-13 NOTE — Plan of Care (Signed)
  Problem: Education: Goal: Knowledge of General Education information will improve Description: Including pain rating scale, medication(s)/side effects and non-pharmacologic comfort measures Outcome: Progressing   Problem: Clinical Measurements: Goal: Ability to maintain clinical measurements within normal limits will improve Outcome: Progressing Goal: Will remain free from infection Outcome: Progressing   Problem: Activity: Goal: Risk for activity intolerance will decrease Outcome: Progressing   Problem: Coping: Goal: Level of anxiety will decrease Outcome: Progressing   

## 2019-08-13 NOTE — Progress Notes (Signed)
Subjective/Chief Complaint: No complaints   Objective: Vital signs in last 24 hours: Temp:  [97.7 F (36.5 C)-98 F (36.7 C)] 97.8 F (36.6 C) (10/31 0600) Pulse Rate:  [69-76] 69 (10/31 0600) Resp:  [14-18] 16 (10/31 0600) BP: (109-122)/(64-73) 109/64 (10/31 0600) SpO2:  [96 %-100 %] 96 % (10/31 0600) Last BM Date: 08/12/19  Intake/Output from previous day: 10/30 0701 - 10/31 0700 In: 598.1 [P.O.:360; IV Piggyback:238.1] Out: -  Intake/Output this shift: No intake/output data recorded.  General appearance: alert and cooperative Resp: clear to auscultation bilaterally Cardio: regular rate and rhythm GI: soft, nontender  Lab Results:  Recent Labs    08/11/19 0317 08/13/19 0033  WBC 5.3 5.3  HGB 14.2 13.7  HCT 45.0 43.4  PLT 181 206   BMET Recent Labs    08/11/19 0317 08/13/19 0033  NA 138  --   K 3.6 3.3*  CL 105  --   CO2 23  --   GLUCOSE 91  --   BUN <5*  --   CREATININE 0.73 0.91  CALCIUM 8.8*  --    PT/INR No results for input(s): LABPROT, INR in the last 72 hours. ABG No results for input(s): PHART, HCO3 in the last 72 hours.  Invalid input(s): PCO2, PO2  Studies/Results: Ct Abdomen Pelvis W Contrast  Result Date: 08/11/2019 CLINICAL DATA:  Diverticulitis, persistent abdominal pain EXAM: CT ABDOMEN AND PELVIS WITH CONTRAST TECHNIQUE: Multidetector CT imaging of the abdomen and pelvis was performed using the standard protocol following bolus administration of intravenous contrast. CONTRAST:  OMNIPAQUE IOHEXOL 300 MG/ML  SOLN COMPARISON:  August 06, 2019 FINDINGS: Lower chest: Lung bases are clear. Hepatobiliary: No focal liver abnormality is seen. No gallstones, gallbladder wall thickening, or biliary dilatation. Pancreas: Unremarkable. No pancreatic ductal dilatation or surrounding inflammatory changes. Spleen: Normal in size without focal abnormality. Adrenals/Urinary Tract: Adrenal glands are unremarkable. Kidneys are normal, without  renal calculi, focal lesion, or hydronephrosis. There is bladder wall thickening due to underdistention and likely reactive change adjacent to the area of colonic inflammation. There is no intraluminal air. Stomach/Bowel: Similar appearance of segmental wall thickening and surrounding inflammatory changes involving the sigmoid colon. The previously seen adjacent extraluminal collection is similar in size with less apparent central low-attenuation. Stomach is within normal limits. Vascular/Lymphatic: No significant vascular findings are present. No enlarged abdominal or pelvic lymph nodes. Reproductive: Uterus and bilateral adnexa are unremarkable. Other: Similar free-fluid in pelvis. Musculoskeletal: Degenerate changes at L5-S1. IMPRESSION: Persistent changes of sigmoid diverticulitis with similar size of small adjacent collection. As noted previously, the bladder abuts this region with wall thickening but no intraluminal air to definitively suggest fistula formation. Electronically Signed   By: Guadlupe Spanish M.D.   On: 08/11/2019 15:35    Anti-infectives: Anti-infectives (From admission, onward)   Start     Dose/Rate Route Frequency Ordered Stop   08/13/19 1000  amoxicillin-clavulanate (AUGMENTIN) 875-125 MG per tablet 1 tablet     1 tablet Oral Every 12 hours 08/13/19 0811     08/08/19 1800  piperacillin-tazobactam (ZOSYN) IVPB 3.375 g  Status:  Discontinued     3.375 g 12.5 mL/hr over 240 Minutes Intravenous Every 8 hours 08/08/19 1625 08/13/19 0811   08/07/19 1000  cefTRIAXone (ROCEPHIN) 2 g in sodium chloride 0.9 % 100 mL IVPB  Status:  Discontinued     2 g 200 mL/hr over 30 Minutes Intravenous Every 24 hours 08/07/19 0751 08/08/19 1625   08/07/19 0100  metroNIDAZOLE (FLAGYL)  IVPB 500 mg  Status:  Discontinued     500 mg 100 mL/hr over 60 Minutes Intravenous 2 times daily 08/07/19 0042 08/08/19 1625   08/07/19 0000  piperacillin-tazobactam (ZOSYN) IVPB 3.375 g     3.375 g 100 mL/hr over 30  Minutes Intravenous  Once 08/06/19 2347 08/07/19 0200      Assessment/Plan: s/p * No surgery found * Advance diet. Continue soft D/c zosyn and start oral augmentin since she has no fever, no pain, and normal wbc Hypothyroidism Obesity, BMI 54.87 Trichomonas Family h/o colon cancer in father  Diverticulitis with microperforation and phlegmon -second attack documented in the past 12 months - CT scan 10/24:  shows acute diverticulitis of the sigmoid colonwith4 x 3.8 x 4.5 cm rim enhancing collection extending from the colonic wall with surrounding phlegmonous change in the left lower quadrant and deep pelvis concerning for a contained perforation or abscess formation;reactive bladder wall thickening and perivesicular stranding/features of cystitis raise suspicion for possible colovesicular fistulization;stranding and hyperemia of the left ovary - CT scan 10/29 improveed w less inflammation & smaller abscess - IR unable to drain fluid collection - If this resolves without surgery this admission she will need a colonoscopy in 6-8 weeks.  ID -zosyn 10/25>> FEN -IVF, CLD ADAT to soft diet VTE -SCDs, lovenox Foley -none Follow up -TBD  LOS: 6 days    Autumn Messing III 08/13/2019

## 2019-08-14 LAB — CBC
HCT: 48.6 % — ABNORMAL HIGH (ref 36.0–46.0)
Hemoglobin: 15.6 g/dL — ABNORMAL HIGH (ref 12.0–15.0)
MCH: 31 pg (ref 26.0–34.0)
MCHC: 32.1 g/dL (ref 30.0–36.0)
MCV: 96.4 fL (ref 80.0–100.0)
Platelets: 193 10*3/uL (ref 150–400)
RBC: 5.04 MIL/uL (ref 3.87–5.11)
RDW: 12.9 % (ref 11.5–15.5)
WBC: 3.6 10*3/uL — ABNORMAL LOW (ref 4.0–10.5)
nRBC: 0 % (ref 0.0–0.2)

## 2019-08-14 MED ORDER — POTASSIUM CHLORIDE CRYS ER 20 MEQ PO TBCR
20.0000 meq | EXTENDED_RELEASE_TABLET | Freq: Every day | ORAL | Status: DC
  Administered 2019-08-14: 10:00:00 20 meq via ORAL
  Filled 2019-08-14: qty 1

## 2019-08-14 MED ORDER — POTASSIUM CHLORIDE CRYS ER 20 MEQ PO TBCR: 20.0000 meq | EXTENDED_RELEASE_TABLET | Freq: Once | ORAL | Status: DC

## 2019-08-14 MED ORDER — POTASSIUM CHLORIDE CRYS ER 20 MEQ PO TBCR: 20.0000 meq | EXTENDED_RELEASE_TABLET | Freq: Every day | ORAL | 0 refills | Status: DC

## 2019-08-14 MED ORDER — AMOXICILLIN-POT CLAVULANATE 875-125 MG PO TABS: 1.0000 | ORAL_TABLET | Freq: Two times a day (BID) | ORAL | 0 refills | Status: AC

## 2019-08-14 NOTE — Plan of Care (Signed)
Patient discharged home in stable condition 

## 2019-08-14 NOTE — Discharge Summary (Signed)
Physician Discharge Summary  Lori C Farnworth ZOX:[id number] DOB: 07-Sep-1986 DOA: 08/06/2019  PCP: [name], NP  Admit date: 08/06/2019 Discharge date: 08/14/2019  Admitted From: Home Disposition: Home  Recommendations for Outpatient Follow-up:  1. Follow up with PCP in 1 week 2. Follow up with General surgery in 3 weeks 3. Colonoscopy in 6-8 weeks 4. Please obtain BMP/CBC in one week 5. Please follow up on the following pending results: None  Home Health: None Equipment/Devices: None  Discharge Condition: Stable CODE STATUS: Full code Diet recommendation: Heart healthy   Brief/Interim Summary:  Admission HPI written by Neena Rhymes, MD   Chief Complaint: abdominal pain  HPI: Lori Mcknight is a 33 y.o. female with medical history significant of  lower abdominal pain. Has been feeling poorly with abdominal pain since about 10/12. At first he was having trouble urinating and having a bowel movement. Went to urgent care and was diagnosed with cystitis. She was also later found to have trichomonas and yeast and was prescribed Flagyl, Diflucan, and eventually doxycycline given continued abdominal pain. She denies any vaginal bleeding or discharge. The pain is midline lower abdomen and left side. There is no new back pain. She did have a low-grade temperature of about 100.2 yesterday and has had some episodes of vomiting over the last day or so. Has dysuria.  ED Course: Hemodynamically stable. Lab reveals normal WBC. CT abdomen with diverticulitis at site of previous diverticulitis now with 4x3.8x4.5 fluid collection that may represent contained perforation vs abscess.   Hospital course:  Diverticulitis with microperforation and pheglmon General surgery consulted. Plan for IR drainage/aspiration, however, they recommended no drainage at the time. Patient has been managed on IV Zosyn. Symptoms are stable. No blood cultures. Repeat CT significant for persistent  changes of diverticulitis and reduction of fluid collection. Patient transitioned to Augmentin with continued stability and discharge to complete a 14 day course per general surgery recommendations.  Trichomonas Treated with Flagyl.  Hypothyroidism Continue Synthroid  Obesity Body mass index is 54.87 kg/m.  Hypokalemia Repleted as needed. Discharged with a 4 day supply. Recheck BMP as an outpatient. This may require further evaluation.  Discharge Diagnoses:  Principal Problem:   Diverticulitis of intestine with abscess Active Problems:   PCOS (polycystic ovarian syndrome)   Essential hypertension   Hypothyroid   Obesity, morbid, BMI 50 or higher ([place])   Endometriosis s/p ablation   Dysuria   Family history of colon cancer in father    Discharge Instructions  Discharge Instructions    Call MD for:  severe uncontrolled pain   Complete by: As directed    Call MD for:  temperature >100.4   Complete by: As directed      Allergies as of 08/14/2019   No Known Allergies     Medication List    STOP taking these medications   cyclobenzaprine 10 MG tablet Commonly known as: FLEXERIL   doxycycline 100 MG capsule Commonly known as: VIBRAMYCIN   metroNIDAZOLE 500 MG tablet Commonly known as: FLAGYL   naproxen 500 MG tablet Commonly known as: NAPROSYN     TAKE these medications   albuterol 108 (90 Base) MCG/ACT inhaler Commonly known as: VENTOLIN HFA Inhale 2 puffs into the lungs every 6 (six) hours as needed for wheezing or shortness of breath.   amoxicillin-clavulanate 875-125 MG tablet Commonly known as: AUGMENTIN Take 1 tablet by mouth 2 (two) times daily for 6 days.   ibuprofen 600 MG tablet Commonly  known as: ADVIL Take 1 tablet (600 mg total) by mouth every 6 (six) hours as needed.   levothyroxine 75 MCG tablet Commonly known as: SYNTHROID Take 1 tablet (75 mcg total) by mouth daily before breakfast for 15 days. NEEDS TO MAKE LAB APPOINTMENT     potassium chloride SA 20 MEQ tablet Commonly known as: KLOR-CON Take 1 tablet (20 mEq total) by mouth daily for 4 days. Start taking on: August 15, 2019      Follow-up Information    Karie Soda, MD Follow up in 3 week(s).   Specialty: General Surgery Contact information: [address] [place] [address] [phone]          No Known Allergies  Consultations:  General surgery   Procedures/Studies: Ct Abdomen Pelvis W Contrast  Result Date: 08/11/2019 CLINICAL DATA:  Diverticulitis, persistent abdominal pain EXAM: CT ABDOMEN AND PELVIS WITH CONTRAST TECHNIQUE: Multidetector CT imaging of the abdomen and pelvis was performed using the standard protocol following bolus administration of intravenous contrast. CONTRAST:  OMNIPAQUE IOHEXOL 300 MG/ML  SOLN COMPARISON:  August 06, 2019 FINDINGS: Lower chest: Lung bases are clear. Hepatobiliary: No focal liver abnormality is seen. No gallstones, gallbladder wall thickening, or biliary dilatation. Pancreas: Unremarkable. No pancreatic ductal dilatation or surrounding inflammatory changes. Spleen: Normal in size without focal abnormality. Adrenals/Urinary Tract: Adrenal glands are unremarkable. Kidneys are normal, without renal calculi, focal lesion, or hydronephrosis. There is bladder wall thickening due to underdistention and likely reactive change adjacent to the area of colonic inflammation. There is no intraluminal air. Stomach/Bowel: Similar appearance of segmental wall thickening and surrounding inflammatory changes involving the sigmoid colon. The previously seen adjacent extraluminal collection is similar in size with less apparent central low-attenuation. Stomach is within normal limits. Vascular/Lymphatic: No significant vascular findings are present. No enlarged abdominal or pelvic lymph nodes. Reproductive: Uterus and bilateral adnexa are unremarkable. Other: Similar free-fluid in pelvis. Musculoskeletal:  Degenerate changes at L5-S1. IMPRESSION: Persistent changes of sigmoid diverticulitis with similar size of small adjacent collection. As noted previously, the bladder abuts this region with wall thickening but no intraluminal air to definitively suggest fistula formation. Electronically Signed   By: Guadlupe Spanish M.D.   On: 08/11/2019 15:35   Ct Abdomen Pelvis W Contrast  Result Date: 08/06/2019 CLINICAL DATA:  Abdominal pain, diverticulitis suspected, UTI for over a week, left-sided abdominal pain EXAM: CT ABDOMEN AND PELVIS WITH CONTRAST TECHNIQUE: Multidetector CT imaging of the abdomen and pelvis was performed using the standard protocol following bolus administration of intravenous contrast. CONTRAST:  OMNIPAQUE IOHEXOL 300 MG/ML  SOLN COMPARISON:  CT abdomen pelvis 09/24/2018 FINDINGS: Lower chest: Atelectatic changes in the left lung base. Lung bases are otherwise clear. Normal heart size. No pericardial effusion. Hepatobiliary: No focal liver abnormality is seen. No gallstones, gallbladder wall thickening, or biliary dilatation. Pancreas: Unremarkable. No pancreatic ductal dilatation or surrounding inflammatory changes. Spleen: Normal in size without focal abnormality. Adrenals/Urinary Tract: Adrenal glands are unremarkable. Kidneys are normal, without renal calculi, focal lesion, or hydronephrosis. Urinary bladder is largely decompressed at the time exam. There is however asymmetric mural thickening and hypoattenuation within the bladder wall and loss of clear lead discernible fat plane between the bladder and associated rim enhancing collection between the bladder and proximal sigmoid colon. Stomach/Bowel: Distal esophagus, stomach and duodenal sweep are unremarkable. No small bowel wall thickening or dilatation. No evidence of obstruction. A normal appendix is visualized. Proximal colon is unremarkable. There is segmental thickening of  the sigmoid colon centered upon a region of numerous  colonic diverticula and an area of inflammation which was present on comparison exam from 09/24/2018. There is now a rim enhancing collection extending from the wall with surrounding phlegmonous change in reactive adenopathy as well as some adjacent low-attenuation free fluid as well. No resulting obstruction. No extraluminal gas. Vascular/Lymphatic: The aorta is normal caliber. Reactive adenopathy in the low pelvis in the region of phlegmonous change. Reproductive: Anteverted uterus. The left ovary is in close proximity to the region of inflammation in the left lower quadrant, positioned posteriorly as evidence by the course of the left gonadal vein. There is loss of discernible fat plane of the anterior wall of left ovary. The right ovary is unremarkable. Other: Free fluid and phlegmonous change in the left lower quadrant and deep pelvis with a rim enhancing collection measuring approximately 4 x 3.8 x 4.5 cm in size concerning for a contained perforation or abscess formation. Loss of discernible fat planes and reactive stranding of the bladder could raise suspicion for possible colovesicular fistulization. No other fluid collections are evident. No free air. No bowel containing hernias. Musculoskeletal: No acute osseous abnormality or suspicious osseous lesion. Degenerative changes in the spine are maximal at L5-S1. Additional vacuum phenomenon and early degenerative sclerosis noted at the SI joints. IMPRESSION: 1. Acute diverticulitis of the sigmoid colon centered upon a region of inflammation which was present on comparison exam from 09/24/2018. There is now a 4 x 3.8 x 4.5 cm rim enhancing collection extending from the colonic wall with surrounding phlegmonous change in the left lower quadrant and deep pelvis, concerning for a contained perforation or abscess formation. Loss of discernible fat planes and reactive bladder wall thickening and perivesicular stranding/features of cystitis raise suspicion for  possible colovesicular fistulization. Such a finding could be further evaluated with a limited CT of the pelvis following rectal contrast administration. 2. Additional stranding and hyperemia of the left ovary which is in close proximity to the rim enhancing collection. Finding is nonspecific though a colo-ovarian fistula is not fully excluded. Electronically Signed   By: Kreg Shropshire M.D.   On: 08/06/2019 23:34      Subjective: No abdominal pain  Discharge Exam: Vitals:   08/13/19 2155 08/14/19 0641  BP: (!) 142/82 (!) 141/75  Pulse: 78 70  Resp: 16 16  Temp: (!) 97.5 F (36.4 C) 98 F (36.7 C)  SpO2: 100% 99%   Vitals:   08/13/19 0600 08/13/19 1325 08/13/19 2155 08/14/19 0641  BP: 109/64 121/62 (!) 142/82 (!) 141/75  Pulse: 69 75 78 70  Resp: 16 14 16 16   Temp: 97.8 F (36.6 C)  (!) 97.5 F (36.4 C) 98 F (36.7 C)  TempSrc:   Oral Oral  SpO2: 96% 99% 100% 99%  Weight:      Height:        General: Pt is alert, awake, not in acute distress Cardiovascular: RRR, S1/S2 +, no rubs, no gallops Respiratory: CTA bilaterally, no wheezing, no rhonchi Abdominal: Soft, NT, ND, bowel sounds + Extremities: no edema, no cyanosis    The results of significant diagnostics from this hospitalization (including imaging, microbiology, ancillary and laboratory) are listed below for reference.     Microbiology: Recent Results (from the past 240 hour(s))  SARS CORONAVIRUS 2 (TAT 6-24 HRS) Nasopharyngeal Nasopharyngeal Swab     Status: None   Collection Time: 08/07/19  1:03 AM   Specimen: Nasopharyngeal Swab  Result Value Ref Range Status  SARS Coronavirus 2 NEGATIVE NEGATIVE Final    Comment: (NOTE) SARS-CoV-2 target nucleic acids are NOT DETECTED. The SARS-CoV-2 RNA is generally detectable in upper and lower respiratory specimens during the acute phase of infection. Negative results do not preclude SARS-CoV-2 infection, do not rule out co-infections with other pathogens, and  should not be used as the sole basis for treatment or other patient management decisions. Negative results must be combined with clinical observations, patient history, and epidemiological information. The expected result is Negative. Fact Sheet for Patients: HairSlick.no Fact Sheet for Healthcare Providers: quierodirigir.com This test is not yet approved or cleared by the [place] FDA and  has been authorized for detection and/or diagnosis of SARS-CoV-2 by FDA under an Emergency Use Authorization (EUA). This EUA will remain  in effect (meaning this test can be used) for the duration of the COVID-19 declaration under Section 56 4(b)(1) of the Act, 21 U.S.C. section 360bbb-3(b)(1), unless the authorization is terminated or revoked sooner. Performed at [facility], [address] [place], [place] [address]      Labs: BNP (last 3 results) No results for input(s): BNP in the last 8760 hours. Basic Metabolic Panel: Recent Labs  Lab 08/08/19 0251 08/09/19 0315 08/10/19 0252 08/11/19 0317 08/12/19 0233 08/13/19 0033  NA 137 139 139 138  --   --   K 3.4* 3.2* 3.3* 3.6  --  3.3*  CL 106 104 107 105  --   --   CO2 --   --   GLUCOSE 82 63* 83 91  --   --   BUN 6 <5* 5* <5*  --   --   CREATININE 0.72 0.72 0.76 0.73  --  0.91  CALCIUM 8.6* 8.5* 8.7* 8.8*  --   --   MG 1.8 1.9 1.8 1.8 1.8  --    Liver Function Tests: No results for input(s): AST, ALT, ALKPHOS, BILITOT, PROT, ALBUMIN in the last 168 hours. No results for input(s): LIPASE, AMYLASE in the last 168 hours. No results for input(s): AMMONIA in the last 168 hours. CBC: Recent Labs  Lab 08/09/19 0315 08/10/19 0252 08/11/19 0317 08/13/19 0033 08/14/19 0940  WBC 5.2 5.4 5.3 5.3 3.6*  HGB 13.6 13.9 14.2 13.7 15.6*  HCT 43.3 43.8 45.0 43.4 48.6*  MCV 97.1 97.6 96.6 97.5 96.4  PLT 183 188 181 206 193   Cardiac Enzymes: No results for  input(s): CKTOTAL, CKMB, CKMBINDEX, TROPONINI in the last 168 hours. BNP: Invalid input(s): POCBNP CBG: No results for input(s): GLUCAP in the last 168 hours. D-Dimer No results for input(s): DDIMER in the last 72 hours. Hgb A1c No results for input(s): HGBA1C in the last 72 hours. Lipid Profile No results for input(s): CHOL, HDL, LDLCALC, TRIG, CHOLHDL, LDLDIRECT in the last 72 hours. Thyroid function studies No results for input(s): TSH, T4TOTAL, T3FREE, THYROIDAB in the last 72 hours.  Invalid input(s): FREET3 Anemia work up No results for input(s): VITAMINB12, FOLATE, FERRITIN, TIBC, IRON, RETICCTPCT in the last 72 hours. Urinalysis    Component Value Date/Time   COLORURINE AMBER (A) 08/06/2019 2038   APPEARANCEUR CLEAR 08/06/2019 2038   LABSPEC 1.018 08/06/2019 2038   PHURINE 6.0 08/06/2019 2038   GLUCOSEU NEGATIVE 08/06/2019 2038   HGBUR NEGATIVE 08/06/2019 2038   BILIRUBINUR NEGATIVE 08/06/2019 2038   BILIRUBINUR negative 08/16/2018 1123   KETONESUR NEGATIVE 08/06/2019 2038   PROTEINUR 30 (A) 08/06/2019 2038   UROBILINOGEN 0.2 07/29/2019 1321  NITRITE NEGATIVE 08/06/2019 2038   LEUKOCYTESUR SMALL (A) 08/06/2019 2038   Sepsis Labs Invalid input(s): PROCALCITONIN,  WBC,  LACTICIDVEN Microbiology Recent Results (from the past 240 hour(s))  SARS CORONAVIRUS 2 (TAT 6-24 HRS) Nasopharyngeal Nasopharyngeal Swab     Status: None   Collection Time: 08/07/19  1:03 AM   Specimen: Nasopharyngeal Swab  Result Value Ref Range Status   SARS Coronavirus 2 NEGATIVE NEGATIVE Final    Comment: (NOTE) SARS-CoV-2 target nucleic acids are NOT DETECTED. The SARS-CoV-2 RNA is generally detectable in upper and lower respiratory specimens during the acute phase of infection. Negative results do not preclude SARS-CoV-2 infection, do not rule out co-infections with other pathogens, and should not be used as the sole basis for treatment or other patient management decisions. Negative  results must be combined with clinical observations, patient history, and epidemiological information. The expected result is Negative. Fact Sheet for Patients: HairSlick.nohttps://www.fda.gov/media/138098/download Fact Sheet for Healthcare Providers: quierodirigir.comhttps://www.fda.gov/media/138095/download This test is not yet approved or cleared by the [place] FDA and  has been authorized for detection and/or diagnosis of SARS-CoV-2 by FDA under an Emergency Use Authorization (EUA). This EUA will remain  in effect (meaning this test can be used) for the duration of the COVID-19 declaration under Section 56 4(b)(1) of the Act, 21 U.S.C. section 360bbb-3(b)(1), unless the authorization is terminated or revoked sooner. Performed at [facility], [address] [place], [place] [address]      Time coordinating discharge: 35 minutes  SIGNED:   Jacquelin Hawkingalph Ryann Leavitt, MD [facility] 08/14/2019, 1:41 PM

## 2019-08-14 NOTE — Discharge Instructions (Signed)
Lori Mcknight,  You are here with diverticulitis with microperforation with concern for a fluid collection surrounding the area.  You are treated with IV antibiotics and subsequent improvement of your symptoms.  The general surgeons would like to follow you up in the office in the next 2 weeks.  The also recommend that he have a colonoscopy in 6 to 8 weeks.  You will be discharged with a short course of antibiotics continue your total antibiotic therapy.

## 2019-08-14 NOTE — Progress Notes (Addendum)
   Subjective/Chief Complaint: No complaints.    Objective: Vital signs in last 24 hours: Temp:  [97.5 F (36.4 C)-98 F (36.7 C)] 98 F (36.7 C) (11/01 0641) Pulse Rate:  [70-78] 70 (11/01 0641) Resp:  [14-16] 16 (11/01 0641) BP: (121-142)/(62-82) 141/75 (11/01 0641) SpO2:  [99 %-100 %] 99 % (11/01 0641) Last BM Date: 08/12/19  Intake/Output from previous day: 10/31 0701 - 11/01 0700 In: 570 [P.O.:450; I.V.:120] Out: 240 [Urine:240] Intake/Output this shift: No intake/output data recorded.  General appearance: alert and cooperative Resp: clear to auscultation bilaterally Cardio: regular rate and rhythm GI: soft, nontender  Lab Results:  Recent Labs    08/13/19 0033  WBC 5.3  HGB 13.7  HCT 43.4  PLT 206   BMET Recent Labs    08/13/19 0033  K 3.3*  CREATININE 0.91   PT/INR No results for input(s): LABPROT, INR in the last 72 hours. ABG No results for input(s): PHART, HCO3 in the last 72 hours.  Invalid input(s): PCO2, PO2  Studies/Results: No results found.  Anti-infectives: Anti-infectives (From admission, onward)   Start     Dose/Rate Route Frequency Ordered Stop   08/13/19 1000  amoxicillin-clavulanate (AUGMENTIN) 875-125 MG per tablet 1 tablet     1 tablet Oral Every 12 hours 08/13/19 0811     08/08/19 1800  piperacillin-tazobactam (ZOSYN) IVPB 3.375 g  Status:  Discontinued     3.375 g 12.5 mL/hr over 240 Minutes Intravenous Every 8 hours 08/08/19 1625 08/13/19 0811   08/07/19 1000  cefTRIAXone (ROCEPHIN) 2 g in sodium chloride 0.9 % 100 mL IVPB  Status:  Discontinued     2 g 200 mL/hr over 30 Minutes Intravenous Every 24 hours 08/07/19 0751 08/08/19 1625   08/07/19 0100  metroNIDAZOLE (FLAGYL) IVPB 500 mg  Status:  Discontinued     500 mg 100 mL/hr over 60 Minutes Intravenous 2 times daily 08/07/19 0042 08/08/19 1625   08/07/19 0000  piperacillin-tazobactam (ZOSYN) IVPB 3.375 g     3.375 g 100 mL/hr over 30 Minutes Intravenous  Once  08/06/19 2347 08/07/19 0200      Assessment/Plan: s/p * No surgery found * Advance diet Discharge as long as wbc not elevated F/u with Dr. Johney Maine  LOS: 7 days    Autumn Messing III 08/14/2019

## 2019-08-22 ENCOUNTER — Other Ambulatory Visit: Payer: Self-pay | Admitting: Nurse Practitioner

## 2019-08-29 ENCOUNTER — Ambulatory Visit: Payer: Self-pay | Admitting: Surgery

## 2019-08-29 ENCOUNTER — Other Ambulatory Visit: Payer: Self-pay | Admitting: Surgery

## 2019-08-29 NOTE — H&P (Signed)
Lori Mcknight Documented: 08/29/2019 3:07 PM Location: [facility] Patient #: [id number] DOB: 22-Aug-1986 Single / Language: [place] / Race: Black or African American Female  History of Present Illness Ardeth Sportsman MD; 08/29/2019 4:48 PM) The patient is a 33 year old female who presents with diverticulitis. Note for "Diverticulitis": ` ` ` The patient returns s/p admission for sigmoid diverticulitis with intramural abscess     Pleasant obese woman with worsening pain and found to have diverticulitis with contained abscess  Super morbidly obese woman who had diverticulitis diagnosed in December 2019. She then had another episode strongly symptomatic in February was placed back on antibiotics. She notes he's had some intermittent pain since then but then had a more severe attack. Had a CAT scan that showed a contained intermural abscess. She was admitted and placed on IV antibiotics last month. She slowly improved. Was not amenable to percutaneous drainage. She had a follow-up CAT scan done 5 days later which showed the fluid collection more organized and and resolving. She was sent home on oral antibiotics.  She comes today for follow-up after discharge. She notes she still has some soreness, but it is better overall. She is eating okay. Not much of an appetite but no nausea or vomiting. She certainly feels better than when she discharge. Denies any urinary symptoms. No urinary tract infections. She's been drinking a lot of fluids. The patient returns to clinic after surgery, gradually improving. Pain is fading away. Denies nausea, constipation/diarrhea, worsening fatigue, high fevers, or other concerns.  No personal nor family history of inflammatory bowel disease, irritable bowel syndrome, allergy such as Celiac Sprue, dietary/dairy problems, colitis, ulcers nor gastritis. No recent sick contacts/gastroenteritis. No travel outside the country. No changes in  diet. No dysphagia to solids or liquids. No significant heartburn or reflux. No hematochezia, hematemesis, coffee ground emesis. No evidence of prior gastric/peptic ulceration. `   Past Surgical History Ethlyn Gallery, CMA; 08/29/2019 3:09 PM) Oral Surgery  Diagnostic Studies History Ethlyn Gallery, CMA; 08/29/2019 3:09 PM) Colonoscopy never Mammogram never Pap Smear 1-5 years ago  Allergies Elease Hashimoto Spillers, CMA; 08/29/2019 3:10 PM) No Known Drug Allergies [08/29/2019]:  Medication History (Alisha Spillers, CMA; 08/29/2019 3:10 PM) No Current Medications Medications Reconciled  Social History Ethlyn Gallery, CMA; 08/29/2019 3:09 PM) Alcohol use Occasional alcohol use. Caffeine use Carbonated beverages, Coffee, Tea. Tobacco use Current some day smoker.  Family History Ethlyn Gallery, CMA; 08/29/2019 3:09 PM) Colon Cancer Father. Depression Mother. Diabetes Mellitus Father, Sister. Heart Disease Father, Mother. Heart disease in female family member before age 71 Heart disease in female family member before age 67 Hypertension Father, Mother, Sister. Kidney Disease Mother. Thyroid problems Mother, Sister.  Pregnancy / Birth History Ethlyn Gallery, CMA; 08/29/2019 3:09 PM) Age at menarche 9 years. Irregular periods  Other Problems Elease Hashimoto Spillers, CMA; 08/29/2019 3:09 PM) Anxiety Disorder Asthma Back Pain Depression Diverticulosis High blood pressure Thyroid Disease     Review of Systems (Alisha Spillers CMA; 08/29/2019 3:09 PM) General Not Present- Appetite Loss, Chills, Fatigue, Fever, Night Sweats, Weight Gain and Weight Loss. Skin Not Present- Change in Wart/Mole, Dryness, Hives, Jaundice, New Lesions, Non-Healing Wounds, Rash and Ulcer. HEENT Present- Ringing in the Ears. Not Present- Earache, Hearing Loss, Hoarseness, Nose Bleed, Oral Ulcers, Seasonal Allergies, Sinus Pain, Sore Throat, Visual Disturbances, Wears  glasses/contact lenses and Yellow Eyes. Respiratory Not Present- Bloody sputum, Chronic Cough, Difficulty Breathing, Snoring and Wheezing. Breast Not Present- Breast Mass, Breast Pain, Nipple Discharge and Skin Changes.  Cardiovascular Present- Leg Cramps. Not Present- Chest Pain, Difficulty Breathing Lying Down, Palpitations, Rapid Heart Rate, Shortness of Breath and Swelling of Extremities. Gastrointestinal Present- Abdominal Pain and Nausea. Not Present- Bloating, Bloody Stool, Change in Bowel Habits, Chronic diarrhea, Constipation, Difficulty Swallowing, Excessive gas, Gets full quickly at meals, Hemorrhoids, Indigestion, Rectal Pain and Vomiting. Female Genitourinary Present- Frequency and Pelvic Pain. Not Present- Nocturia, Painful Urination and Urgency. Musculoskeletal Present- Back Pain, Joint Stiffness and Muscle Pain. Not Present- Joint Pain, Muscle Weakness and Swelling of Extremities. Psychiatric Present- Anxiety and Frequent crying. Not Present- Bipolar, Change in Sleep Pattern, Depression and Fearful. Hematology Not Present- Blood Thinners, Easy Bruising, Excessive bleeding, Gland problems, HIV and Persistent Infections.  Vitals (Alisha Spillers CMA; 08/29/2019 3:10 PM) 08/29/2019 3:09 PM Weight: 338 lb Height: 66in Body Surface Area: 2.5 m Body Mass Index: 54.55 kg/m  Temp.: 28F(Oral)  Pulse: 64 (Regular)  BP: 128/82 (Sitting, Left Arm, Standard)        Physical Exam Ardeth Sportsman MD; 08/29/2019 4:43 PM)  General Mental Status-Alert. General Appearance-Not in acute distress, Not Sickly. Orientation-Oriented X3. Hydration-Well hydrated. Voice-Normal.  Integumentary Global Assessment Upon inspection and palpation of skin surfaces of the - Axillae: non-tender, no inflammation or ulceration, no drainage. and Distribution of scalp and body hair is normal. General Characteristics Temperature - normal warmth is noted.  Head and Neck  Head-normocephalic, atraumatic with no lesions or palpable masses. Face Global Assessment - atraumatic, no absence of expression. Neck Global Assessment - no abnormal movements, no bruit auscultated on the right, no bruit auscultated on the left, no decreased range of motion, non-tender. Trachea-midline. Thyroid Gland Characteristics - non-tender.  Eye Eyeball - Left-Extraocular movements intact, No Nystagmus - Left. Eyeball - Right-Extraocular movements intact, No Nystagmus - Right. Cornea - Left-No Hazy - Left. Cornea - Right-No Hazy - Right. Sclera/Conjunctiva - Left-No scleral icterus, No Discharge - Left. Sclera/Conjunctiva - Right-No scleral icterus, No Discharge - Right. Pupil - Left-Direct reaction to light normal. Pupil - Right-Direct reaction to light normal.  ENMT Ears Pinna - Left - no drainage observed, no generalized tenderness observed. Pinna - Right - no drainage observed, no generalized tenderness observed. Nose and Sinuses External Inspection of the Nose - no destructive lesion observed. Inspection of the nares - Left - quiet respiration. Inspection of the nares - Right - quiet respiration. Mouth and Throat Lips - Upper Lip - no fissures observed, no pallor noted. Lower Lip - no fissures observed, no pallor noted. Nasopharynx - no discharge present. Oral Cavity/Oropharynx - Tongue - no dryness observed. Oral Mucosa - no cyanosis observed. Hypopharynx - no evidence of airway distress observed.  Chest and Lung Exam Inspection Movements - Normal and Symmetrical. Accessory muscles - No use of accessory muscles in breathing. Palpation Palpation of the chest reveals - Non-tender. Auscultation Breath sounds - Normal and Clear.  Cardiovascular Auscultation Rhythm - Regular. Murmurs & Other Heart Sounds - Auscultation of the heart reveals - No Murmurs and No Systolic Clicks.  Abdomen Inspection Inspection of the abdomen reveals - No Visible  peristalsis and No Abnormal pulsations. Umbilicus - No Bleeding, No Urine drainage. Palpation/Percussion Palpation and Percussion of the abdomen reveal - Soft, Non Tender, No Rebound tenderness, No Rigidity (guarding) and No Cutaneous hyperesthesia. Note: Abdomen : Obese but soft. Mild discomfort along the left lower quadrant and suprapubic region. No Spear guarding or peritonitis. No diastases recti. Infraumbilical subcutaneous hematoma consistent with prior Lovenox shot. No abscess. 3 x 4 cm region. Clean  on her panniculus with good hygiene. No diastasis recti. No umbilical or other anterior abdominal wall hernias  Female Genitourinary Sexual Maturity Tanner 5 - Adult hair pattern. Note: No vaginal bleeding nor discharge  Peripheral Vascular Upper Extremity Inspection - Left - No Cyanotic nailbeds - Left, Not Ischemic. Inspection - Right - No Cyanotic nailbeds - Right, Not Ischemic.  Neurologic Neurologic evaluation reveals -normal attention span and ability to concentrate, able to name objects and repeat phrases. Appropriate fund of knowledge , normal sensation and normal coordination. Mental Status Affect - not angry, not paranoid. Cranial Nerves-Normal Bilaterally. Gait-Normal.  Neuropsychiatric Mental status exam performed with findings of-able to articulate well with normal speech/language, rate, volume and coherence, thought content normal with ability to perform basic computations and apply abstract reasoning and no evidence of hallucinations, delusions, obsessions or homicidal/suicidal ideation.  Musculoskeletal Global Assessment Spine, Ribs and Pelvis - no instability, subluxation or laxity. Right Upper Extremity - no instability, subluxation or laxity.  Lymphatic Head & Neck  General Head & Neck Lymphatics: Bilateral - Description - No Localized lymphadenopathy. Axillary  General Axillary Region: Bilateral - Description - No Localized lymphadenopathy.  Femoral & Inguinal  Generalized Femoral & Inguinal Lymphatics: Left - Description - No Localized lymphadenopathy. Right - Description - No Localized lymphadenopathy.    Assessment & Plan [name] MD; 08/29/2019 4:43 PM)  SIGMOID DIVERTICULITIS (K57.32) Impression: Recurrent sigmoid diverticulitis with at least 4 attacks in the past year. Most recent one with probable intramural abscess requiring IV antibiotics slowly improving.  Given the recurrent episodes in such a young woman, last one being a complicated attack with prolonged hospitalization, I think it would make sense to proceed with segmental colonic resection. I do not think that he requires ureteral Firefly/stenting at this time given her intraperitoneal fat and obesity. Discussed with my colorectal partner, Dr. Dema Severin, who agrees.  Ideally patient get a colonoscopy done beforehand since her father had colon cancer in his 12s. Trying tenets of that she gets bowel prep and colonoscopy with surgery the next day  Current Plans Started Amoxicillin-Pot Clavulanate 875-125 MG Oral Tablet, 1 (one) Tablet two times daily, #10, 5 days starting 08/29/2019, Ref. x2. You are being scheduled for surgery- Our schedulers will call you.  You should hear from our office's scheduling department within 5 working days about the location, date, and time of surgery. We try to make accommodations for patient's preferences in scheduling surgery, but sometimes the OR schedule or the surgeon's schedule prevents [place] from making those accommodations.  If you have not heard from our office [phone]) in 5 working days, call the office and ask for your surgeon's nurse.  If you have other questions about your diagnosis, plan, or surgery, call the office and ask for your surgeon's nurse.   ABSCESS OF SIGMOID COLON DUE TO DIVERTICULITIS (K57.20) Impression: Probable intramural abscess of sigmoid colon slowly improving with antibiotics.  She just  completed a course. I wrote for some Augmentin when necessary. She may need another round her to our stay on indefinitely until she gets her surgery. Trying hold off for now. He feels markedly worse than repeat CAT scan   PREOP COLON - ENCOUNTER FOR PREOPERATIVE EXAMINATION FOR GENERAL SURGICAL PROCEDURE (Z00.174)  Current Plans Written instructions provided The anatomy & physiology of the digestive tract was discussed. The pathophysiology of the colon was discussed. Natural history risks without surgery was discussed. I feel the risks of no intervention will lead to serious problems that outweigh  the operative risks; therefore, I recommended a partial colectomy to remove the pathology. Minimally invasive (Robotic/Laparoscopic) & open techniques were discussed.  Risks such as bleeding, infection, abscess, leak, reoperation, possible ostomy, hernia, heart attack, death, and other risks were discussed. I noted a good likelihood this will help address the problem. Goals of post-operative recovery were discussed as well. Need for adequate nutrition, daily bowel regimen and healthy physical activity, to optimize recovery was noted as well. We will work to minimize complications. Educational materials were available as well. Questions were answered. The patient expresses understanding & wishes to proceed with surgery.  Pt Education - [facility] Colon Bowel Prep 2018 ERAS/Miralax/Antibiotics Started Neomycin Sulfate 500 MG Oral Tablet, 2 (two) Tablet SEE NOTE, #6, 08/29/2019, No Refill. Local Order: Pharmacist Notes: TAKE TWO TABLETS AT 2 PM, 3 PM, AND 10 PM THE DAY PRIOR TO SURGERY Started Flagyl 500 MG Oral Tablet, 2 (two) Tablet SEE NOTE, #6, 08/29/2019, No Refill. Local Order: Pharmacist Notes: Take at 2pm, 3pm, and 10pm the day prior to your colon operation Pt Education - Pamphlet Given - Laparoscopic Colorectal Surgery: discussed with patient and provided information. Pt Education - [facility]  Colectomy post-op instructions: discussed with patient and provided information.  BMI 50.0-59.9, ADULT (Z61.09(Z68.43) Impression: Morbidly obese. Resection robotically to minimize risk of hernia and wound infections.  She may benefit from weight loss surgery in the future. We can try and consider giving her information in the future, I don't think it can happen before her needed diverticulitis surgery   FAMILY HISTORY OF COLORECTAL CANCER (Z80.0) Impression: Given her current diverticulitis in the young age and with a father diagnosed with colorectal cancer in his 6940s, think it would make sense to get a colonoscopy done preoperatively. See if we can time in the day before surgery  Current Plans Instructed to schedule colonoscopywith a gastroenterologist  Ardeth SportsmanSteven C. Callin Ashe, MD, FACS, MASCRS Gastrointestinal and Minimally Invasive Surgery  [facility] [address] [place], [place] [id number] [phone] Main / Paging [phone] Fax

## 2019-08-31 ENCOUNTER — Encounter: Payer: Self-pay | Admitting: Gastroenterology

## 2019-09-22 ENCOUNTER — Ambulatory Visit: Payer: Self-pay | Attending: Nurse Practitioner | Admitting: Physician Assistant

## 2019-09-22 ENCOUNTER — Other Ambulatory Visit: Payer: Self-pay

## 2019-09-22 VITALS — BP 121/71 | HR 84 | Temp 98.2°F | Ht 66.0 in | Wt 341.6 lb

## 2019-09-22 DIAGNOSIS — E039 Hypothyroidism, unspecified: Secondary | ICD-10-CM

## 2019-09-22 DIAGNOSIS — G8929 Other chronic pain: Secondary | ICD-10-CM

## 2019-09-22 DIAGNOSIS — R519 Headache, unspecified: Secondary | ICD-10-CM

## 2019-09-22 DIAGNOSIS — Z131 Encounter for screening for diabetes mellitus: Secondary | ICD-10-CM

## 2019-09-22 DIAGNOSIS — M5442 Lumbago with sciatica, left side: Secondary | ICD-10-CM

## 2019-09-22 MED ORDER — LEVOTHYROXINE SODIUM 75 MCG PO TABS: 75.0000 ug | ORAL_TABLET | Freq: Every day | ORAL | 5 refills | Status: DC

## 2019-09-22 MED ORDER — METHYLPREDNISOLONE SODIUM SUCC 125 MG IJ SOLR
125.0000 mg | Freq: Once | INTRAMUSCULAR | Status: AC
  Administered 2019-09-22: 125 mg via INTRAMUSCULAR

## 2019-09-22 MED ORDER — NAPROXEN 500 MG PO TABS: 500.0000 mg | ORAL_TABLET | Freq: Two times a day (BID) | ORAL | 1 refills | Status: DC

## 2019-09-22 MED ORDER — METHOCARBAMOL 500 MG PO TABS: 1000.0000 mg | ORAL_TABLET | Freq: Three times a day (TID) | ORAL | 1 refills | Status: DC

## 2019-09-22 NOTE — Progress Notes (Signed)
Lori Mcknight, is a 33 y.o. female  QJF:[id number]  SLH:[id number]  DOB - [birth year]  Subjective:  Chief Complaint and HPI: Lori Mcknight is a 33 y.o. female here today needing thyroid medication RF.  She has been off her meds for a while except for some doses while she was in the hospital in October 2020.  She is having frequent HA.  No vision changes.  HA not really relieved with advil or tylenol.    Also says she has a h/o sciatica and a "bulging disc."  She is requesting tramadol.  Some pain into L leg.  No weakness or numbness.  NKI.    ROS:   Constitutional:  No f/c, No night sweats, No unexplained weight loss. EENT:  No vision changes, No blurry vision, No hearing changes. No mouth, throat, or ear problems.  Respiratory: No cough, No SOB Cardiac: No CP, no palpitations GI:  No abd pain, No N/V/D. GU: No Urinary s/sx Musculoskeletal: back pain Neuro: + headache, no dizziness, no motor weakness.  Skin: No rash Endocrine:  No polydipsia. No polyuria.  Psych: Denies SI/HI  No problems updated.  ALLERGIES: No Known Allergies  PAST MEDICAL HISTORY: Past Medical History:  Diagnosis Date  . Anemia    history of anemia  . Anxiety   . Asthma    inhaler as needed  . Chest pain of uncertain etiology    intermittent left side chest pain  . Chlamydia   . Depression   . Endometriosis   . Genital herpes   . Headache    MIGRAINES  . Hypertension    no meds since April. needs new px per pt   . Hypothyroidism   . Morbid obesity (HCC)   . Obese   . Obesity, Class III, BMI 40-49.9 (morbid obesity) (HCC) 11/06/2011  . PCOS (polycystic ovarian syndrome)   . PONV (postoperative nausea and vomiting)   . Shortness of breath    on exertion from weight  . Thyroid disease   . Urinary tract infection     MEDICATIONS AT HOME: Prior to Admission medications   Medication Sig Start Date End Date Taking? Authorizing Provider  albuterol (PROVENTIL HFA;VENTOLIN HFA) 108 (90 Base) MCG/ACT  inhaler Inhale 2 puffs into the lungs every 6 (six) hours as needed for wheezing or shortness of breath. 01/19/18  Yes Claiborne Rigg, NP  ibuprofen (ADVIL,MOTRIN) 600 MG tablet Take 1 tablet (600 mg total) by mouth every 6 (six) hours as needed. 04/29/18  Yes Allie Bossier, MD  levothyroxine (SYNTHROID) 75 MCG tablet Take 1 tablet (75 mcg total) by mouth daily before breakfast. 09/22/19 03/20/20  Anders Simmonds, PA-C  methocarbamol (ROBAXIN) 500 MG tablet Take 2 tablets (1,000 mg total) by mouth 3 (three) times daily. X 7 days then prn pain 09/22/19   Georgian Co M, PA-C  naproxen (NAPROSYN) 500 MG tablet Take 1 tablet (500 mg total) by mouth 2 (two) times daily with a meal. X 7 days then prn pain 09/22/19   Anders Simmonds, PA-C     Objective:  EXAM:   Vitals:   09/22/19 1407  BP: 121/71  Pulse: 84  Temp: 98.2 F (36.8 C)  TempSrc: Oral  SpO2: 100%  Weight: (!) 341 lb 9.6 oz (154.9 kg)  Height: 5\' 6"  (1.676 m)    General appearance : A&OX3. NAD. Non-toxic-appearing HEENT: Atraumatic and Normocephalic.  PERRLA. EOM intact.   Neck: supple, no JVD. No cervical lymphadenopathy. ? mild thyromegaly Chest/Lungs:  Breathing-non-labored, Good air entry bilaterally, breath sounds normal without rales, rhonchi, or wheezing  CVS: S1 S2 regular, no murmurs, gallops, rubs  Back:  ROM good except as limited by obesity.  No bony TTP.  There is paraspinus spasm B.  Neg SLR B.  DTR=intact BLE.   Extremities: Bilateral Lower Ext shows no edema, both legs are warm to touch with = pulse throughout Neurology:  CN II-XII grossly intact, Non focal.   Psych:  TP linear. J/I WNL. Normal speech. Appropriate eye contact and affect.  Skin:  No Rash  Data Review Lab Results  Component Value Date   HGBA1C 5.5 11/12/2016   HGBA1C 5.5 06/23/2016     Assessment & Plan   1. Chronic bilateral low back pain with left-sided sciatica Naprosyn and methocarbamol X 7 days then prn Weight loss imperative  for this problem - Vitamin D, 25-hydroxy - methylPREDNISolone sodium succinate (SOLU-MEDROL) 125 mg/2 mL injection 125 mg  2. Hypothyroidism, unspecified type Resume meds.  She is off meds - Thyroid Panel With TSH - levothyroxine (SYNTHROID) 75 MCG tablet; Take 1 tablet (75 mcg total) by mouth daily before breakfast.  Dispense: 30 tablet; Refill: 5  3. Screening for diabetes mellitus - Hemoglobin A1c  4. Chronic nonintractable headache, unspecified headache type Likely due to being off thyroid meds.  No red flags today.  To ED if worsens or neuro s/sx develop   Patient have been counseled extensively about nutrition and exercise  Return in about 8 weeks (around 11/17/2019) for with PCP to follow up on abnormal thyroid and back pain.  The patient was given clear instructions to go to ER or return to medical center if symptoms don't improve, worsen or new problems develop. The patient verbalized understanding. The patient was told to call to get lab results if they haven't heard anything in the next week.     Freeman Caldron, PA-C [name], [place]   09/22/2019, 2:40 PMPatient ID: Lori Mcknight, female   DOB: 1985/11/12, 33 y.o.   MRN: [id number]

## 2019-09-23 LAB — THYROID PANEL WITH TSH
Free Thyroxine Index: 1.1 — ABNORMAL LOW (ref 1.2–4.9)
T3 Uptake Ratio: 23 % — ABNORMAL LOW (ref 24–39)
T4, Total: 4.6 ug/dL (ref 4.5–12.0)
TSH: 22.5 u[IU]/mL — ABNORMAL HIGH (ref 0.450–4.500)

## 2019-09-23 LAB — VITAMIN D 25 HYDROXY (VIT D DEFICIENCY, FRACTURES): Vit D, 25-Hydroxy: 7.2 ng/mL — ABNORMAL LOW (ref 30.0–100.0)

## 2019-09-23 MED FILL — LEVOTHYROXINE 75 MCG TABLET: 75 | 30 days supply | Qty: 30 | Fill #0

## 2019-09-23 MED FILL — METHOCARBAMOL 500 MG TABS: 500 | 15 days supply | Qty: 90 | Fill #0

## 2019-09-23 MED FILL — NAPROXEN 500 MG TABLET: 500 | 30 days supply | Qty: 60 | Fill #0

## 2019-09-26 ENCOUNTER — Other Ambulatory Visit: Payer: Self-pay | Admitting: Physician Assistant

## 2019-09-26 MED ORDER — VITAMIN D (ERGOCALCIFEROL) 1.25 MG (50000 UNIT) PO CAPS: 50000.0000 [IU] | ORAL_CAPSULE | ORAL | 0 refills | Status: DC

## 2019-09-29 ENCOUNTER — Ambulatory Visit: Payer: Self-pay | Admitting: Gastroenterology

## 2019-10-04 MED FILL — LEVOTHYROXINE 75 MCG TABLET: 75 | 30 days supply | Qty: 30 | Fill #0

## 2019-10-04 MED FILL — METHOCARBAMOL 500 MG TABS: 500 | 15 days supply | Qty: 90 | Fill #0

## 2019-11-07 ENCOUNTER — Ambulatory Visit: Payer: Self-pay | Admitting: Gastroenterology

## 2019-11-07 NOTE — Progress Notes (Signed)
No show letter sent.

## 2019-11-17 ENCOUNTER — Other Ambulatory Visit: Payer: Self-pay

## 2019-11-17 ENCOUNTER — Encounter: Payer: Self-pay | Admitting: Nurse Practitioner

## 2019-11-17 ENCOUNTER — Ambulatory Visit: Payer: Self-pay | Attending: Nurse Practitioner | Admitting: Nurse Practitioner

## 2019-11-17 DIAGNOSIS — M5442 Lumbago with sciatica, left side: Secondary | ICD-10-CM

## 2019-11-17 DIAGNOSIS — E559 Vitamin D deficiency, unspecified: Secondary | ICD-10-CM

## 2019-11-17 DIAGNOSIS — M5441 Lumbago with sciatica, right side: Secondary | ICD-10-CM

## 2019-11-17 DIAGNOSIS — E039 Hypothyroidism, unspecified: Secondary | ICD-10-CM

## 2019-11-17 DIAGNOSIS — G8929 Other chronic pain: Secondary | ICD-10-CM

## 2019-11-17 MED ORDER — VITAMIN D (ERGOCALCIFEROL) 1.25 MG (50000 UNIT) PO CAPS: 50000.0000 [IU] | ORAL_CAPSULE | ORAL | 0 refills | Status: DC

## 2019-11-17 MED ORDER — PREGABALIN 75 MG PO CAPS: 75.0000 mg | ORAL_CAPSULE | Freq: Two times a day (BID) | ORAL | 0 refills | Status: DC

## 2019-11-17 MED FILL — PREGABALIN 75 MG CAPS: 75 | 30 days supply | Qty: 60 | Fill #0

## 2019-11-17 MED FILL — VIT D2 1.25 MG (50,000 UNIT: 1.25 MG | 112 days supply | Qty: 16 | Fill #0

## 2019-11-17 NOTE — Progress Notes (Signed)
Virtual Visit via Telephone Note Due to national recommendations of social distancing due to COVID 19, telehealth visit is felt to be most appropriate for this patient at this time.  I discussed the limitations, risks, security and privacy concerns of performing an evaluation and management service by telephone and the availability of in person appointments. I also discussed with the patient that there may be a patient responsible charge related to this service. The patient expressed understanding and agreed to proceed.    I connected with [place] C Mraz on 11/17/19  at  11:10 AM EST  EDT by telephone and verified that I am speaking with the correct person using two identifiers.   Consent I discussed the limitations, risks, security and privacy concerns of performing an evaluation and management service by telephone and the availability of in person appointments. I also discussed with the patient that there may be a patient responsible charge related to this service. The patient expressed understanding and agreed to proceed.   Location of Patient: Private Residence   Location of Provider: [facility]    Persons participating in Telemedicine visit: Bertram Denver FNP-BC YY Henlawson CMA [place] C Mawson    History of Present Illness: Telemedicine visit for: Chronic back pain  Ongoing for several years. She was instructed to follow up with neurosurgery 04-2018 after an ED visit for back pain however she was uninsured at the time.   She has tried lidocaine patches, cymbalta, percocet,  gabapentin and muscle relaxants, prednisone, toradol and tramadol. Most with little to no relief. Endorses intermittent shooting pains throughout her legs and feet. Aggravating factors: lying down, switching positions, prolonged sitting or standing. She does have some issues with stress incontinence as well. Unclear if this is weight related or neuropathic   Hypothyroidism Not well  controlled. Currently taking synthroid 75 mg daily. She does not endorse any heat/cold intolerance, bowel/skin changes or CVS symptoms.  Lab Results  Component Value Date   TSH 22.500 (H) 09/22/2019    Past Medical History:  Diagnosis Date  . Anemia    history of anemia  . Anxiety   . Asthma    inhaler as needed  . Chest pain of uncertain etiology    intermittent left side chest pain  . Chlamydia   . Depression   . Endometriosis   . Genital herpes   . Headache    MIGRAINES  . Hypertension    no meds since April. needs new px per pt   . Hypothyroidism   . Morbid obesity (HCC)   . Obese   . Obesity, Class III, BMI 40-49.9 (morbid obesity) (HCC) 11/06/2011  . PCOS (polycystic ovarian syndrome)   . PONV (postoperative nausea and vomiting)   . Shortness of breath    on exertion from weight  . Thyroid disease   . Urinary tract infection     Past Surgical History:  Procedure Laterality Date  . DILATION AND CURETTAGE OF UTERUS    . DILATION AND CURETTAGE OF UTERUS N/A 04/29/2018   Procedure: DILATATION AND CURETTAGE;  Surgeon: Allie Bossier, MD;  Location: [facility];  Service: Gynecology;  Laterality: N/A;  . HYSTEROSCOPY WITH D & C N/A 04/06/2014   Procedure: DILATATION AND CURETTAGE /HYSTEROSCOPY ;  Surgeon: Tereso Newcomer, MD;  Location: [facility];  Service: Gynecology;  Laterality: N/A;  . LAPAROSCOPY N/A 04/29/2018   Procedure: LAPAROSCOPY DIAGNOSTIC WITH PERITONEAL BIOPSY;  Surgeon: Allie Bossier, MD;  Location: [facility];  Service: Gynecology;  Laterality: N/A;  . LEFT HEART CATH AND CORONARY ANGIOGRAPHY N/A 12/12/2016   Procedure: Left Heart Cath and Coronary Angiography;  Surgeon: Peter M Martinique, MD;  Location: [facility];  Service: Cardiovascular;  Laterality: N/A;  . NO PAST SURGERIES    . WISDOM TOOTH EXTRACTION      Family History  Problem Relation Age of Onset  . Heart disease Mother   . Sleep apnea Mother   . Depression Mother   . Hypertension Mother   . Kidney  disease Mother   . Diabetes Father   . Heart disease Father   . Colon cancer Father        23s  . Hypertension Father   . Heart disease Maternal Grandmother   . Diabetes Maternal Grandfather   . Heart disease Maternal Grandfather   . Diabetes Sister   . Hypertension Sister     Social History   Socioeconomic History  . Marital status: Single    Spouse name: Not on file  . Number of children: Not on file  . Years of education: Not on file  . Highest education level: Not on file  Occupational History  . Not on file  Tobacco Use  . Smoking status: Current Every Day Smoker  . Smokeless tobacco: Never Used  . Tobacco comment: 2-3 cigerettes a week.  Substance and Sexual Activity  . Alcohol use: Yes    Alcohol/week: 0.0 standard drinks    Comment: occasonal   . Drug use: Not Currently    Types: Marijuana  . Sexual activity: Not Currently    Birth control/protection: None  Other Topics Concern  . Not on file  Social History Narrative  . Not on file   Social Determinants of Health   Financial Resource Strain:   . Difficulty of Paying Living Expenses: Not on file  Food Insecurity:   . Worried About Charity fundraiser in the Last Year: Not on file  . Ran Out of Food in the Last Year: Not on file  Transportation Needs:   . Lack of Transportation (Medical): Not on file  . Lack of Transportation (Non-Medical): Not on file  Physical Activity:   . Days of Exercise per Week: Not on file  . Minutes of Exercise per Session: Not on file  Stress:   . Feeling of Stress : Not on file  Social Connections:   . Frequency of Communication with Friends and Family: Not on file  . Frequency of Social Gatherings with Friends and Family: Not on file  . Attends Religious Services: Not on file  . Active Member of Clubs or Organizations: Not on file  . Attends Archivist Meetings: Not on file  . Marital Status: Not on file     Observations/Objective: Awake, alert and oriented  x 3   Review of Systems  Constitutional: Negative for fever, malaise/fatigue and weight loss.  HENT: Negative.  Negative for nosebleeds.   Eyes: Negative.  Negative for blurred vision, double vision and photophobia.  Respiratory: Negative.  Negative for cough and shortness of breath.   Cardiovascular: Negative.  Negative for chest pain, palpitations and leg swelling.  Gastrointestinal: Negative.  Negative for heartburn, nausea and vomiting.  Musculoskeletal: Positive for back pain. Negative for myalgias.  Neurological: Positive for sensory change. Negative for dizziness, focal weakness, seizures and headaches.  Psychiatric/Behavioral: Negative.  Negative for suicidal ideas.    Assessment and Plan: [place] was seen today for follow-up.  Diagnoses  and all orders for this visit:  Chronic bilateral low back pain with bilateral sciatica -     pregabalin (LYRICA) 75 MG capsule; Take 1 capsule (75 mg total) by mouth 2 (two) times daily. Work on losing weight to help reduce back pain. May alternate with heat and ice application for pain relief. May also alternate with acetaminophen and Ibuprofen as prescribed for back pain. Other alternatives include massage, acupuncture and water aerobics.  You must stay active and avoid a sedentary lifestyle.    Hypothyroidism, unspecified type -     TSH; Future  Vitamin D deficiency disease -     Vitamin D, Ergocalciferol, (DRISDOL) 1.25 MG (50000 UNIT) CAPS capsule; Take 1 capsule (50,000 Units total) by mouth every 7 (seven) days.     Follow Up Instructions Return in about 4 weeks (around 12/15/2019) for back pain started lyrica.     I discussed the assessment and treatment plan with the patient. The patient was provided an opportunity to ask questions and all were answered. The patient agreed with the plan and demonstrated an understanding of the instructions.   The patient was advised to call back or seek an in-person evaluation if the symptoms  worsen or if the condition fails to improve as anticipated.  I provided 18 minutes of non-face-to-face time during this encounter including median intraservice time, reviewing previous notes, labs, imaging, medications and explaining diagnosis and management.  Claiborne Rigg, FNP-BC

## 2019-11-18 ENCOUNTER — Ambulatory Visit: Payer: Self-pay | Admitting: Nurse Practitioner

## 2019-11-24 ENCOUNTER — Other Ambulatory Visit: Payer: Self-pay

## 2019-11-24 ENCOUNTER — Ambulatory Visit: Payer: Self-pay | Attending: Nurse Practitioner

## 2019-11-24 DIAGNOSIS — E039 Hypothyroidism, unspecified: Secondary | ICD-10-CM

## 2019-11-25 LAB — TSH: TSH: 6.5 u[IU]/mL — ABNORMAL HIGH (ref 0.450–4.500)

## 2019-11-27 ENCOUNTER — Other Ambulatory Visit: Payer: Self-pay | Admitting: Nurse Practitioner

## 2019-11-27 DIAGNOSIS — E039 Hypothyroidism, unspecified: Secondary | ICD-10-CM

## 2019-11-27 MED ORDER — LEVOTHYROXINE SODIUM 88 MCG PO TABS: 88.0000 ug | ORAL_TABLET | Freq: Every day | ORAL | 1 refills | Status: DC

## 2019-11-28 MED FILL — LEVOTHYROXINE 88 MCG TABLET: 88 | 30 days supply | Qty: 30 | Fill #0

## 2019-12-12 ENCOUNTER — Telehealth: Payer: Self-pay | Admitting: Nurse Practitioner

## 2019-12-12 NOTE — Telephone Encounter (Signed)
Patient states she will no longer take Lyrica as it is giving her side effects/    Please call patient.

## 2019-12-21 MED FILL — LEVOTHYROXINE 88 MCG TABLET: 88 | 30 days supply | Qty: 30 | Fill #0

## 2020-01-16 ENCOUNTER — Ambulatory Visit: Payer: Self-pay | Admitting: Obstetrics and Gynecology

## 2020-02-20 ENCOUNTER — Ambulatory Visit: Payer: Self-pay | Admitting: Obstetrics and Gynecology

## 2020-03-08 ENCOUNTER — Encounter: Payer: Self-pay | Admitting: Obstetrics & Gynecology

## 2020-03-08 ENCOUNTER — Other Ambulatory Visit: Payer: Self-pay

## 2020-03-08 ENCOUNTER — Ambulatory Visit (INDEPENDENT_AMBULATORY_CARE_PROVIDER_SITE_OTHER): Payer: Self-pay | Admitting: Obstetrics & Gynecology

## 2020-03-08 VITALS — BP 146/87 | HR 79 | Wt 336.8 lb

## 2020-03-08 DIAGNOSIS — N921 Excessive and frequent menstruation with irregular cycle: Secondary | ICD-10-CM

## 2020-03-08 DIAGNOSIS — N809 Endometriosis, unspecified: Secondary | ICD-10-CM

## 2020-03-08 DIAGNOSIS — R309 Painful micturition, unspecified: Secondary | ICD-10-CM

## 2020-03-08 NOTE — Progress Notes (Signed)
States she has had cycle 4/5 through the 18th And 5/1 through 7th.  Here for the endometriosis pain Cervix is coming out, Pressure and sharp pain associated with that.

## 2020-03-09 LAB — URINALYSIS
Bilirubin, UA: NEGATIVE
Glucose, UA: NEGATIVE
Ketones, UA: NEGATIVE
Leukocytes,UA: NEGATIVE
Nitrite, UA: NEGATIVE
Protein,UA: NEGATIVE
RBC, UA: NEGATIVE
Specific Gravity, UA: 1.021 (ref 1.005–1.030)
Urobilinogen, Ur: 0.2 mg/dL (ref 0.2–1.0)
pH, UA: 6.5 (ref 5.0–7.5)

## 2020-03-09 LAB — URINE CULTURE

## 2020-03-14 NOTE — Progress Notes (Signed)
   Subjective:    Patient ID: Lori Mcknight, female    DOB: [birth year], 34 y.o.   MRN: [id number]  HPI  Pt presents for pelvic pain, menometrorrhagia and cervix prolapsing.  She has had multiple (at least 4) episodes of sigmoid diverticulitis.  Most recent one with probable intramural abscess requiring IV antibiotics. Pt has pelvic pain and diagnosed with endometriosis on laparoscopy (biopsy 04/2018) by dr. Marice Potter. She also states she has had irregular cycles with bleeding April 5th through the 18th and May 1st through 7th.  She also feels like her cervix is coming out dome days (not feeling it today).  Review of Systems  Constitutional: Negative.   Respiratory: Negative.   Cardiovascular: Negative.   Gastrointestinal: Positive for abdominal pain.  Genitourinary: Positive for menstrual problem and pelvic pain.  Psychiatric/Behavioral: Negative.        Objective:   Physical Exam Vitals reviewed.  Constitutional:      General: She is not in acute distress.    Appearance: She is well-developed.  HENT:     Head: Normocephalic and atraumatic.  Eyes:     Conjunctiva/sclera: Conjunctivae normal.  Cardiovascular:     Rate and Rhythm: Normal rate.  Pulmonary:     Effort: Pulmonary effort is normal.  Abdominal:     Palpations: Abdomen is soft.     Tenderness: There is no abdominal tenderness.  Genitourinary:    Comments: Tanner V Vulva:  No lesion Vagina:  Pink, no lesions, no discharge, no blood Cervix:  No CMT, mild prolapse of cervix with valsalva Uterus:  Non tender, limited by habitus Right adnexa--non tender, limited by habitus Left adnexa--non tender, limited by habitus  Skin:    General: Skin is warm and dry.  Neurological:     Mental Status: She is alert and oriented to person, place, and time.       Assessment & Plan:  34 yo female with pelvic pain, menometrorrhagia  1. Pelvic and TVUS  2. Cervical prolapse not significant enough for pessary at this time 3.  RTC  after [place].  Keep menstrual calendar.  Endometrial biopsy as clinically indicated. 4.  PCOT pregnancy test negative (Reproted by N. Capel, CMA) 4.  Consider Pollie Friar for treatment

## 2020-03-16 ENCOUNTER — Other Ambulatory Visit: Payer: Self-pay

## 2020-03-16 ENCOUNTER — Telehealth: Payer: Self-pay

## 2020-03-16 ENCOUNTER — Encounter (HOSPITAL_COMMUNITY): Payer: Self-pay

## 2020-03-16 ENCOUNTER — Ambulatory Visit
Admission: RE | Admit: 2020-03-16 | Discharge: 2020-03-16 | Disposition: A | Discharge status: Home / Self Care | Payer: Self-pay | Source: Ambulatory Visit | Attending: Obstetrics & Gynecology | Admitting: Obstetrics & Gynecology

## 2020-03-16 DIAGNOSIS — J45909 Unspecified asthma, uncomplicated: Secondary | ICD-10-CM | POA: Insufficient documentation

## 2020-03-16 DIAGNOSIS — I10 Essential (primary) hypertension: Secondary | ICD-10-CM | POA: Insufficient documentation

## 2020-03-16 DIAGNOSIS — Z79899 Other long term (current) drug therapy: Secondary | ICD-10-CM | POA: Insufficient documentation

## 2020-03-16 DIAGNOSIS — K5732 Diverticulitis of large intestine without perforation or abscess without bleeding: Secondary | ICD-10-CM | POA: Insufficient documentation

## 2020-03-16 DIAGNOSIS — F17210 Nicotine dependence, cigarettes, uncomplicated: Secondary | ICD-10-CM | POA: Insufficient documentation

## 2020-03-16 DIAGNOSIS — N921 Excessive and frequent menstruation with irregular cycle: Secondary | ICD-10-CM | POA: Insufficient documentation

## 2020-03-16 DIAGNOSIS — E039 Hypothyroidism, unspecified: Secondary | ICD-10-CM | POA: Insufficient documentation

## 2020-03-16 DIAGNOSIS — T3995XA Adverse effect of unspecified nonopioid analgesic, antipyretic and antirheumatic, initial encounter: Secondary | ICD-10-CM | POA: Insufficient documentation

## 2020-03-16 DIAGNOSIS — Z885 Allergy status to narcotic agent status: Secondary | ICD-10-CM | POA: Insufficient documentation

## 2020-03-16 LAB — CBC
HCT: 45.1 % (ref 36.0–46.0)
Hemoglobin: 14.8 g/dL (ref 12.0–15.0)
MCH: 31.8 pg (ref 26.0–34.0)
MCHC: 32.8 g/dL (ref 30.0–36.0)
MCV: 97 fL (ref 80.0–100.0)
Platelets: 204 10*3/uL (ref 150–400)
RBC: 4.65 MIL/uL (ref 3.87–5.11)
RDW: 12.7 % (ref 11.5–15.5)
WBC: 7.7 10*3/uL (ref 4.0–10.5)
nRBC: 0 % (ref 0.0–0.2)

## 2020-03-16 LAB — COMPREHENSIVE METABOLIC PANEL
ALT: 21 U/L (ref 0–44)
AST: 22 U/L (ref 15–41)
Albumin: 4.1 g/dL (ref 3.5–5.0)
Alkaline Phosphatase: 66 U/L (ref 38–126)
Anion gap: 11 (ref 5–15)
BUN: 10 mg/dL (ref 6–20)
CO2: 23 mmol/L (ref 22–32)
Calcium: 9.3 mg/dL (ref 8.9–10.3)
Chloride: 105 mmol/L (ref 98–111)
Creatinine, Ser: 0.79 mg/dL (ref 0.44–1.00)
GFR calc Af Amer: >60 mL/min (ref >60)
GFR calc non Af Amer: >60 mL/min (ref >60)
Glucose, Bld: 102 mg/dL — ABNORMAL HIGH (ref 70–99)
Potassium: 3.2 mmol/L — ABNORMAL LOW (ref 3.5–5.1)
Sodium: 139 mmol/L (ref 135–145)
Total Bilirubin: 0.8 mg/dL (ref 0.3–1.2)
Total Protein: 8.3 g/dL — ABNORMAL HIGH (ref 6.5–8.1)

## 2020-03-16 LAB — URINALYSIS, ROUTINE W REFLEX MICROSCOPIC
Bacteria, UA: NONE SEEN
Bilirubin Urine: NEGATIVE
Glucose, UA: NEGATIVE mg/dL
Ketones, ur: NEGATIVE mg/dL
Leukocytes,Ua: NEGATIVE
Nitrite: NEGATIVE
Protein, ur: NEGATIVE mg/dL
Specific Gravity, Urine: 1.023 (ref 1.005–1.030)
pH: 5 (ref 5.0–8.0)

## 2020-03-16 LAB — I-STAT BETA HCG BLOOD, ED (MC, WL, AP ONLY): I-stat hCG, quantitative: <5 m[IU]/mL (ref <5)

## 2020-03-16 LAB — LIPASE, BLOOD: Lipase: 24 U/L (ref 11–51)

## 2020-03-16 MED ORDER — SODIUM CHLORIDE 0.9% FLUSH
3.0000 mL | Freq: Once | INTRAVENOUS | Status: AC
  Administered 2020-03-17: 3 mL via INTRAVENOUS

## 2020-03-16 NOTE — ED Triage Notes (Signed)
Patient c/o LLQ abdominal pain, N/v/d since yesterday.

## 2020-03-16 NOTE — Telephone Encounter (Signed)
Patient will come back Monday to leave a UPT she doesn't remember the results. So I can result her test and close her chart.

## 2020-03-17 ENCOUNTER — Encounter (HOSPITAL_COMMUNITY): Payer: Self-pay

## 2020-03-17 ENCOUNTER — Emergency Department (HOSPITAL_COMMUNITY)
Admission: EM | Admit: 2020-03-17 | Discharge: 2020-03-17 | Disposition: A | Discharge status: Home / Self Care | Payer: Self-pay | Attending: Emergency Medicine | Admitting: Emergency Medicine

## 2020-03-17 ENCOUNTER — Emergency Department (HOSPITAL_COMMUNITY): Payer: Self-pay

## 2020-03-17 DIAGNOSIS — K5792 Diverticulitis of intestine, part unspecified, without perforation or abscess without bleeding: Secondary | ICD-10-CM

## 2020-03-17 DIAGNOSIS — T50905A Adverse effect of unspecified drugs, medicaments and biological substances, initial encounter: Secondary | ICD-10-CM

## 2020-03-17 MED ORDER — ONDANSETRON 4 MG PO TBDP
4.0000 mg | ORAL_TABLET | Freq: Three times a day (TID) | ORAL | 0 refills | Status: DC | PRN
Start: 2020-03-17 — End: 2020-03-28

## 2020-03-17 MED ORDER — ONDANSETRON HCL 4 MG/2ML IJ SOLN
4.0000 mg | Freq: Once | INTRAMUSCULAR | Status: AC
  Administered 2020-03-17: 4 mg via INTRAVENOUS
  Filled 2020-03-17: qty 2

## 2020-03-17 MED ORDER — AMOXICILLIN-POT CLAVULANATE 875-125 MG PO TABS
1.0000 | ORAL_TABLET | Freq: Two times a day (BID) | ORAL | 0 refills | Status: DC
Start: 2020-03-17 — End: 2020-03-17

## 2020-03-17 MED ORDER — IOHEXOL 300 MG/ML  SOLN
100.0000 mL | Freq: Once | INTRAMUSCULAR | Status: AC | PRN
  Administered 2020-03-17: 100 mL via INTRAVENOUS

## 2020-03-17 MED ORDER — DIPHENHYDRAMINE HCL 50 MG/ML IJ SOLN
25.0000 mg | Freq: Once | INTRAMUSCULAR | Status: AC
  Administered 2020-03-17: 25 mg via INTRAVENOUS
  Filled 2020-03-17: qty 1

## 2020-03-17 MED ORDER — HYDROMORPHONE HCL 1 MG/ML IJ SOLN
1.0000 mg | Freq: Once | INTRAMUSCULAR | Status: AC
  Administered 2020-03-17: 1 mg via INTRAVENOUS
  Filled 2020-03-17: qty 1

## 2020-03-17 MED ORDER — OXYCODONE-ACETAMINOPHEN 5-325 MG PO TABS: 1.0000 | ORAL_TABLET | Freq: Three times a day (TID) | ORAL | 0 refills | Status: DC | PRN

## 2020-03-17 MED ORDER — SODIUM CHLORIDE 0.9 % IV BOLUS
1000.0000 mL | Freq: Once | INTRAVENOUS | Status: AC
  Administered 2020-03-17: 1000 mL via INTRAVENOUS

## 2020-03-17 MED ORDER — ONDANSETRON 4 MG PO TBDP
4.0000 mg | ORAL_TABLET | Freq: Three times a day (TID) | ORAL | 0 refills | Status: DC | PRN
Start: 2020-03-17 — End: 2020-03-17

## 2020-03-17 MED ORDER — MORPHINE SULFATE (PF) 4 MG/ML IV SOLN
4.0000 mg | Freq: Once | INTRAVENOUS | Status: AC
  Administered 2020-03-17: 4 mg via INTRAVENOUS
  Filled 2020-03-17: qty 1

## 2020-03-17 MED ORDER — AMOXICILLIN-POT CLAVULANATE 875-125 MG PO TABS
1.0000 | ORAL_TABLET | Freq: Two times a day (BID) | ORAL | 0 refills | Status: DC
Start: 2020-03-17 — End: 2020-03-28

## 2020-03-17 MED ORDER — FAMOTIDINE 20 MG PO TABS
20.0000 mg | ORAL_TABLET | Freq: Once | ORAL | Status: AC
  Administered 2020-03-17: 20 mg via ORAL
  Filled 2020-03-17: qty 1

## 2020-03-17 MED ORDER — AMOXICILLIN-POT CLAVULANATE 875-125 MG PO TABS
1.0000 | ORAL_TABLET | Freq: Once | ORAL | Status: AC
  Administered 2020-03-17: 1 via ORAL
  Filled 2020-03-17: qty 1

## 2020-03-17 MED ORDER — FAMOTIDINE IN NACL 20-0.9 MG/50ML-% IV SOLN: 20.0000 mg | Freq: Once | INTRAVENOUS | Status: DC

## 2020-03-17 NOTE — Discharge Instructions (Addendum)
Take the antibiotics as prescribed. You can take pain medicine as needed. Continue taking Benadryl as needed to help with itching. Return to the ER for any worsening pain, uncontrollable vomiting, fever. You will need to follow-up with your primary care provider as well.

## 2020-03-17 NOTE — ED Provider Notes (Signed)
[facility] Note   CSN: [id number] Arrival date & time: 03/16/20  1726     History Chief Complaint  Patient presents with  . Abdominal Pain  . Emesis  . Diarrhea    Lori Mcknight is a 34 y.o. female with a past medical history of hypertension, obesity, PCOS, diverticulitis presenting to the ED with a chief complaint of left lower quadrant abdominal pain, nausea, vomiting and diarrhea since yesterday.  States that this feels similar to her prior diverticulitis flareups.  She also reports dysuria since yesterday.  She denies any pelvic pain, vaginal discharge or abnormal vaginal bleeding.  She has been taking over-the-counter medication such as Tylenol and ibuprofen no significant improvement in her pain.  She denies prior abdominal surgeries.  Patient had a pelvic ultrasound done yesterday for "problems I have been having with my uterus" which did not show any abnormalities.  Denies any sick contacts with similar symptoms, chest pain, shortness of breath, possibility of pregnancy.  HPI     Past Medical History:  Diagnosis Date  . Anemia    history of anemia  . Anxiety   . Asthma    inhaler as needed  . Chest pain of uncertain etiology    intermittent left side chest pain  . Chlamydia   . Depression   . Endometriosis   . Genital herpes   . Headache    MIGRAINES  . Hypertension    no meds since April. needs new px per pt   . Hypothyroidism   . Morbid obesity (HCC)   . Obese   . Obesity, Class III, BMI 40-49.9 (morbid obesity) (HCC) 11/06/2011  . PCOS (polycystic ovarian syndrome)   . PONV (postoperative nausea and vomiting)   . Shortness of breath    on exertion from weight  . Thyroid disease   . Urinary tract infection     Patient Active Problem List   Diagnosis Date Noted  . Obesity, morbid, BMI 50 or higher (HCC) 08/08/2019  . Endometriosis s/p ablation 08/08/2019  . Dysuria 08/08/2019  . Family history of colon cancer  in father 08/08/2019  . Diverticulitis of intestine with abscess 08/07/2019  . Incomplete bladder emptying 02/18/2018  . DUB (dysfunctional uterine bleeding) 02/18/2018  . Anemia 02/18/2018  . Hypothyroid 01/29/2017  . Migraines 08/26/2016  . Chest pain 02/04/2016  . Abnormal uterine bleeding (AUB) 01/09/2014  . Thickened endometrium 12/02/2013  . Essential hypertension 10/21/2013  . PCOS (polycystic ovarian syndrome) 11/06/2011  . Infertility, female 11/06/2011  . Pelvic pain in female 08/01/2011    Past Surgical History:  Procedure Laterality Date  . DILATION AND CURETTAGE OF UTERUS    . DILATION AND CURETTAGE OF UTERUS N/A 04/29/2018   Procedure: DILATATION AND CURETTAGE;  Surgeon: Allie Bossier, MD;  Location: [facility];  Service: Gynecology;  Laterality: N/A;  . HYSTEROSCOPY WITH D & C N/A 04/06/2014   Procedure: DILATATION AND CURETTAGE /HYSTEROSCOPY ;  Surgeon: Tereso Newcomer, MD;  Location: [facility];  Service: Gynecology;  Laterality: N/A;  . LAPAROSCOPY N/A 04/29/2018   Procedure: LAPAROSCOPY DIAGNOSTIC WITH PERITONEAL BIOPSY;  Surgeon: Allie Bossier, MD;  Location: [facility];  Service: Gynecology;  Laterality: N/A;  . LEFT HEART CATH AND CORONARY ANGIOGRAPHY N/A 12/12/2016   Procedure: Left Heart Cath and Coronary Angiography;  Surgeon: Peter M Swaziland, MD;  Location: [facility] IN[facility]ASIVE CV [facility];  Service: Cardiovascular;  Laterality: N/A;  . NO PAST SURGERIES    .  WISDOM TOOTH EXTRACTION       OB History    Gravida  0   Para  0   Term  0   Preterm  0   AB  0   Living  0     SAB  0   TAB  0   Ectopic  0   Multiple  0   Live Births  0           Family History  Problem Relation Age of Onset  . Heart disease Mother   . Sleep apnea Mother   . Depression Mother   . Hypertension Mother   . Kidney disease Mother   . Diabetes Father   . Heart disease Father   . Colon cancer Father        34s  . Hypertension Father   . Heart disease Maternal Grandmother   . Diabetes  Maternal Grandfather   . Heart disease Maternal Grandfather   . Diabetes Sister   . Hypertension Sister     Social History   Tobacco Use  . Smoking status: Current Every Day Smoker  . Smokeless tobacco: Never Used  . Tobacco comment: 2-3 cigerettes a week.  Substance Use Topics  . Alcohol use: Yes    Alcohol/week: 0.0 standard drinks    Comment: occasonal   . Drug use: Not Currently    Types: Marijuana    Home Medications Prior to Admission medications   Medication Sig Start Date End Date Taking? Authorizing Provider  albuterol (PROVENTIL HFA;VENTOLIN HFA) 108 (90 Base) MCG/ACT inhaler Inhale 2 puffs into the lungs every 6 (six) hours as needed for wheezing or shortness of breath. 01/19/18  Yes [name], NP  ergocalciferol (VITAMIN D2) 1.25 MG (50000 UT) capsule Take 1 capsule by mouth once a week. 11/17/19  Yes [provider]  levothyroxine (SYNTHROID) 88 MCG tablet Take 1 tablet (88 mcg total) by mouth daily before breakfast. 11/27/19 03/17/20 Yes [name], NP  amoxicillin-clavulanate (AUGMENTIN) 875-125 MG tablet Take 1 tablet by mouth every 12 (twelve) hours. 03/17/20   Shandell Jallow, PA-C  ondansetron (ZOFRAN ODT) 4 MG disintegrating tablet Take 1 tablet (4 mg total) by mouth every 8 (eight) hours as needed for nausea or vomiting. 03/17/20   Jarrah Babich, PA-C  oxyCODONE-acetaminophen (PERCOCET/ROXICET) 5-325 MG tablet Take 1 tablet by mouth every 8 (eight) hours as needed for severe pain. 03/17/20   Manisha Cancel, PA-C    Allergies    Dilaudid [hydromorphone]  Review of Systems   Review of Systems  Constitutional: Negative for appetite change, chills and fever.  HENT: Negative for ear pain, rhinorrhea, sneezing and sore throat.   Eyes: Negative for photophobia and visual disturbance.  Respiratory: Negative for cough, chest tightness, shortness of breath and wheezing.   Cardiovascular: Negative for chest pain and palpitations.  Gastrointestinal: Positive for  abdominal pain, diarrhea, nausea and vomiting. Negative for blood in stool and constipation.  Genitourinary: Negative for dysuria, hematuria and urgency.  Musculoskeletal: Negative for myalgias.  Skin: Negative for rash.  Neurological: Negative for dizziness, weakness and light-headedness.    Physical Exam Updated Vital Signs BP 135/61 (BP Location: Right Arm)   Pulse 70   Temp 98.3 F (36.8 C) (Oral)   Resp 17   Ht 5\' 6"  (1.676 m)   Wt (!) 152 kg   SpO2 98%   BMI 54.07 kg/m   Physical Exam Vitals and nursing note reviewed.  Constitutional:  General: She is not in acute distress.    Appearance: She is well-developed.  HENT:     Head: Normocephalic and atraumatic.     Nose: Nose normal.  Eyes:     General: No scleral icterus.       Left eye: No discharge.     Conjunctiva/sclera: Conjunctivae normal.  Cardiovascular:     Rate and Rhythm: Normal rate and regular rhythm.     Heart sounds: Normal heart sounds. No murmur. No friction rub. No gallop.   Pulmonary:     Effort: Pulmonary effort is normal. No respiratory distress.     Breath sounds: Normal breath sounds.  Abdominal:     General: Bowel sounds are normal. There is no distension.     Palpations: Abdomen is soft.     Tenderness: There is abdominal tenderness in the left lower quadrant. There is no guarding.  Musculoskeletal:        General: Normal range of motion.     Cervical back: Normal range of motion and neck supple.  Skin:    General: Skin is warm and dry.     Findings: No rash.  Neurological:     Mental Status: She is alert.     Motor: No abnormal muscle tone.     Coordination: Coordination normal.     ED Results / Procedures / Treatments   Labs (all labs ordered are listed, but only abnormal results are displayed) Labs Reviewed  COMPREHENSIVE METABOLIC PANEL - Abnormal; Notable for the following components:      Result Value   Potassium 3.2 (*)    Glucose, Bld 102 (*)    Total Protein 8.3  (*)    All other components within normal limits  URINALYSIS, ROUTINE W REFLEX MICROSCOPIC - Abnormal; Notable for the following components:   APPearance HAZY (*)    Hgb urine dipstick MODERATE (*)    All other components within normal limits  LIPASE, BLOOD  CBC  I-STAT BETA HCG BLOOD, ED ([facility], [facility], [facility] ONLY)    EKG None  Radiology US Transvaginal Non-OB  Result Date: 03/16/2020 CLINICAL DATA:  Menorrhagia EXAM: TRANSABDOMINAL AND TRANSVAGINAL ULTRASOUND OF PELVIS TECHNIQUE: Both transabdominal and transvaginal ultrasound examinations of the pelvis were performed. Transabdominal technique was performed for global imaging of the pelvis including uterus, ovaries, adnexal regions, and pelvic cul-de-sac. It was necessary to proceed with endovaginal exam following the transabdominal exam to visualize the uterus, endometrium, ovaries and adnexa. COMPARISON:  01/14/2018 FINDINGS: Uterus Measurements: 8.8 x 5.3 x 6.3 cm. No fibroids or other mass visualized. Endometrium Thickness: 12 mm in thickness.  No focal abnormality visualized. Right ovary Measurements: 2.8 x 1.5 x 1.8 cm = volume: 3.8 mL. Normal appearance/no adnexal mass. Left ovary Measurements: 3.6 x 1.6 x 2.1 cm = volume: 6.2 mL. Normal appearance/no adnexal mass. Other findings Moderate to large free fluid in the pelvis. IMPRESSION: No visible uterine or ovarian abnormality. Moderate to large free fluid in the pelvis. Electronically Signed   By: Charlett Nose M.D.   On: 03/16/2020 21:16   US PELVIS (TRANSABDOMINAL ONLY)  Result Date: 03/16/2020 CLINICAL DATA:  Menorrhagia EXAM: TRANSABDOMINAL AND TRANSVAGINAL ULTRASOUND OF PELVIS TECHNIQUE: Both transabdominal and transvaginal ultrasound examinations of the pelvis were performed. Transabdominal technique was performed for global imaging of the pelvis including uterus, ovaries, adnexal regions, and pelvic cul-de-sac. It was necessary to proceed with endovaginal exam following the transabdominal  exam to visualize the uterus, endometrium, ovaries and adnexa. COMPARISON:  01/14/2018  FINDINGS: Uterus Measurements: 8.8 x 5.3 x 6.3 cm. No fibroids or other mass visualized. Endometrium Thickness: 12 mm in thickness.  No focal abnormality visualized. Right ovary Measurements: 2.8 x 1.5 x 1.8 cm = volume: 3.8 mL. Normal appearance/no adnexal mass. Left ovary Measurements: 3.6 x 1.6 x 2.1 cm = volume: 6.2 mL. Normal appearance/no adnexal mass. Other findings Moderate to large free fluid in the pelvis. IMPRESSION: No visible uterine or ovarian abnormality. Moderate to large free fluid in the pelvis. Electronically Signed   By: Charlett NoseKevin  Dover M.D.   On: 03/16/2020 21:16   CT ABDOMEN PELVIS W CONTRAST  Result Date: 03/17/2020 CLINICAL DATA:  Left lower quadrant abdominal pain with nausea, vomiting and diarrhea since yesterday. EXAM: CT ABDOMEN AND PELVIS WITH CONTRAST TECHNIQUE: Multidetector CT imaging of the abdomen and pelvis was performed using the standard protocol following bolus administration of intravenous contrast. CONTRAST:  100mL OMNIPAQUE IOHEXOL 300 MG/ML  SOLN COMPARISON:  08/11/2019 FINDINGS: Lower chest: No acute abnormality. Hepatobiliary: No focal liver abnormality is seen. No gallstones, gallbladder wall thickening, or biliary dilatation. Pancreas: Unremarkable. No pancreatic ductal dilatation or surrounding inflammatory changes. Spleen: Normal in size without focal abnormality. Adrenals/Urinary Tract: Adrenal glands are unremarkable. Kidneys are normal, without renal calculi, focal lesion, or hydronephrosis. Bladder is unremarkable. Stomach/Bowel: There is eccentric wall thickening of the mid to lower sigmoid colon with adjacent inflammatory stranding, which abuts the left broad ligament. There are multiple small diverticula along this portion of the sigmoid colon. There is no extraluminal air and no defined fluid collection to suggest an abscess. Remainder of the colon is normal in caliber with  no other areas of wall thickening or inflammation. Normal stomach, small bowel and appendix. Vascular/Lymphatic: No significant vascular findings are present. No enlarged abdominal or pelvic lymph nodes. Reproductive: Uterus and bilateral adnexa are unremarkable. Other: Small amount of free fluid in the cul-de-sac of the posterior pelvis. No abdominal wall hernia. No free air. Musculoskeletal: No fracture or acute finding. No osteoblastic or osteolytic lesions. Disc degenerative changes at L5-S1. IMPRESSION: 1. Mild uncomplicated diverticulitis of the mid to lower sigmoid colon, in the same location as diverticulitis noted on the CT from 08/11/2019. No extraluminal or free air. No convincing abscess. 2. No other acute or significant abnormality within the abdomen or pelvis. Electronically Signed   By: Amie Portlandavid  Ormond M.D.   On: 03/17/2020 04:59    Procedures Procedures (including critical care time)  Medications Ordered in ED Medications  sodium chloride flush (NS) 0.9 % injection 3 mL (3 mLs Intravenous Given 03/17/20 0202)  sodium chloride 0.9 % bolus 1,000 mL (0 mLs Intravenous Stopped 03/17/20 0427)  morphine 4 MG/ML injection 4 mg (4 mg Intravenous Given 03/17/20 0202)  ondansetron (ZOFRAN) injection 4 mg (4 mg Intravenous Given 03/17/20 0201)  HYDROmorphone (DILAUDID) injection 1 mg (1 mg Intravenous Given 03/17/20 0328)  iohexol (OMNIPAQUE) 300 MG/ML solution 100 mL (100 mLs Intravenous Contrast Given 03/17/20 0354)  diphenhydrAMINE (BENADRYL) injection 25 mg (25 mg Intravenous Given 03/17/20 0424)  amoxicillin-clavulanate (AUGMENTIN) 875-125 MG per tablet 1 tablet (1 tablet Oral Given 03/17/20 0539)  famotidine (PEPCID) tablet 20 mg (20 mg Oral Given 03/17/20 0539)    ED Course  I have reviewed the triage vital signs and the nursing notes.  Pertinent labs & imaging results that were available during my care of the patient were reviewed by me and considered in my medical decision making (see chart for  details).  Clinical Course as of Mar 17 2777  Sat Mar 17, 2020  2423 Patient reports history of diverticulitis with abscess formation in the past.  Chart review shows that IR drainage or surgery was not completed as she had significant improvement with IV antibiotics.  No leukocytosis noted at that time.  She reports similar symptoms today.  We will need to obtain CT scan to rule out abscess formation or other complication of diverticulitis.   [HK]  [id number] Patient reports itching all over her body after receiving IV Dilaudid.  States that she is not ever had Dilaudid in the past.  Patient with erythema near the IV site.  No signs of angioedema or anaphylaxis.  Will order Benadryl and add medication to her allergy list.   [HK]    Clinical Course User Index [HK] Dietrich Pates, PA-C   MDM Rules/Calculators/A&P                      34 year old female with a past medical history of hypertension, obesity, PCOS, diverticulitis with abscess formation in the past with improvement with antibiotics presenting to the ED with a chief complaint of left lower quadrant abdominal pain, vomiting and diarrhea since yesterday.  States that this feels similar to her prior diverticulitis flareups.  Reports dysuria as well.  Denies any pelvic pain.  She underwent a pelvic ultrasound yesterday which she states was unrelated to her current symptoms.  On exam there is visualization of left lower quadrant without rebound or guarding.  She is afebrile without recent use of antipyretics.  Lab work here significant for unremarkable CBC, CMP, lipase.  hCG is unremarkable.  CT of the abdomen pelvis shows mild uncomplicated diverticulitis.  No evidence of abscess formation or free air.  Suspect that this is the cause of her symptoms today.  Patient did have an allergic reaction to IV Dilaudid today.  This was not an anaphylactic reaction.  This was controlled with antihistamines.  We will send her home with pain medication which she has  taken in the past as well as antibiotics.  Encouraged her to follow-up with her PCP and return for worsening symptoms. Of note, she has not had Dilaudid in the past but she has had Percocet in the past without difficulty.  All imaging, if done today, including plain films, CT scans, and ultrasounds, independently reviewed by me, and interpretations confirmed via formal radiology reads.  Patient is hemodynamically stable, in NAD, and able to ambulate in the ED. Evaluation does not show pathology that would require ongoing emergent intervention or inpatient treatment. I explained the diagnosis to the patient. Pain has been managed and has no complaints prior to discharge. Patient is comfortable with above plan and is stable for discharge at this time. All questions were answered prior to disposition. Strict return precautions for returning to the ED were discussed. Encouraged follow up with PCP.   Prior to providing a prescription for a controlled substance, I independently reviewed the patient's recent prescription history on the [place] Controlled Substance Reporting System. The patient had no recent or regular prescriptions and was deemed appropriate for a brief, less than 3 day prescription of narcotic for acute analgesia.  An After Visit Summary was printed and given to the patient.   Portions of this note were generated with Scientist, clinical (histocompatibility and immunogenetics). Dictation errors may occur despite best attempts at proofreading.  Final Clinical Impression(s) / ED Diagnoses Final diagnoses:  Diverticulitis  Adverse effect of drug, initial encounter    Rx /  DC Orders ED Discharge Orders         Ordered    amoxicillin-clavulanate (AUGMENTIN) 875-125 MG tablet  Every 12 hours     03/17/20 0522    ondansetron (ZOFRAN ODT) 4 MG disintegrating tablet  Every 8 hours PRN     03/17/20 0522    oxyCODONE-acetaminophen (PERCOCET/ROXICET) 5-325 MG tablet  Every 8 hours PRN     03/17/20 0522            Dietrich Pates, PA-C 03/17/20 0555    Ward, Layla Maw, DO 03/17/20 2951

## 2020-03-19 ENCOUNTER — Telehealth: Payer: Self-pay

## 2020-03-19 NOTE — Telephone Encounter (Signed)
Called patient to see if she was still coming for a UPT she advised she forgotten and that she will be here in the morning to leave one.

## 2020-03-19 NOTE — Telephone Encounter (Signed)
----- Message from Darrel Hoover, RN sent at 03/19/2020  3:38 PM EDT ----- Regarding: FW: Please close [name], Would you please call this patient to have her come in for a pregnancy test? THanks, Tresa Endo ----- Message ----- From: Lesly Dukes, MD Sent: 03/19/2020   1:50 PM EDT To: Darrel Hoover, RN Subject: RE: Please close                               Can we have her come in for a pregnancy test?  She has large fluid in her abdomen.  ----- Message ----- From: Darrel Hoover, RN Sent: 03/19/2020  11:17 AM EDT To: Lesly Dukes, MD Subject: RE: Please close                               The pregnancy test was never completed.  I wouldn't document a negative result.  Cancelling the order is fine. ----- Message ----- From: Lesly Dukes, MD Sent: 03/16/2020   3:54 PM EDT To: Darrel Hoover, RN Subject: RE: Please close                               I still can't.  I will cancel the order and document a negative pregnancy test in my note.  Hope that is ok. ----- Message ----- From: Darrel Hoover, RN Sent: 03/16/2020  12:07 PM EDT To: Lesly Dukes, MD Subject: RE: Please close                               You should be able to sign it off now. ----- Message ----- From: Lesly Dukes, MD Sent: 03/16/2020  10:57 AM EDT To: Darrel Hoover, RN Subject: RE: Please close                               Thanks. ----- Message ----- From: Darrel Hoover, RN Sent: 03/16/2020   9:33 AM EDT To: Lesly Dukes, MD Subject: RE: Please close                               I'm checking on this issue.  I'll get back to you as soon as I know something. Thanks, Tresa Endo ----- Message ----- From: Lesly Dukes, MD Sent: 03/15/2020  12:21 PM EDT To: Darrel Hoover, RN Subject: RE: Please close                               There is a PCOT pregnancy test that is not resulted so I can't close the chart.  Can you please help with this.   ----- Message ----- From:  Darrel Hoover, RN Sent: 03/15/2020   8:34 AM EDT To: Lesly Dukes, MD Subject: Please close                                   Please close encounter dated 03/08/20. Thanks, [employer]

## 2020-03-20 ENCOUNTER — Other Ambulatory Visit: Payer: Self-pay

## 2020-03-20 ENCOUNTER — Ambulatory Visit (INDEPENDENT_AMBULATORY_CARE_PROVIDER_SITE_OTHER): Payer: Self-pay

## 2020-03-20 VITALS — Wt 335.6 lb

## 2020-03-20 DIAGNOSIS — N921 Excessive and frequent menstruation with irregular cycle: Secondary | ICD-10-CM

## 2020-03-20 DIAGNOSIS — Z3202 Encounter for pregnancy test, result negative: Secondary | ICD-10-CM

## 2020-03-20 LAB — POCT PREGNANCY, URINE: Preg Test, Ur: NEGATIVE

## 2020-03-20 NOTE — Progress Notes (Signed)
Patient came in to leave a urine sample that was missed on the 27th of may.  UPT was Negative. States she had a pregnancy test done in the hospital that was negative.   Pt verbalized understanding of if the test was negative then no further action was needed and I would call her if it was positive.

## 2020-03-26 ENCOUNTER — Other Ambulatory Visit: Payer: Self-pay

## 2020-03-26 ENCOUNTER — Encounter (HOSPITAL_COMMUNITY): Payer: Self-pay

## 2020-03-26 ENCOUNTER — Emergency Department (HOSPITAL_COMMUNITY): Payer: Self-pay

## 2020-03-26 ENCOUNTER — Inpatient Hospital Stay (HOSPITAL_COMMUNITY)
Admit: 2020-03-26 | Discharge: 2020-04-01 | DRG: 392 | Disposition: A | Discharge status: Home / Self Care | Payer: Self-pay | Attending: Internal Medicine | Admitting: Internal Medicine

## 2020-03-26 DIAGNOSIS — Z818 Family history of other mental and behavioral disorders: Secondary | ICD-10-CM

## 2020-03-26 DIAGNOSIS — E876 Hypokalemia: Secondary | ICD-10-CM | POA: Diagnosis present

## 2020-03-26 DIAGNOSIS — Z87891 Personal history of nicotine dependence: Secondary | ICD-10-CM

## 2020-03-26 DIAGNOSIS — Z800 Family history of malignant neoplasm of digestive organs: Secondary | ICD-10-CM

## 2020-03-26 DIAGNOSIS — Z79890 Hormone replacement therapy (postmenopausal): Secondary | ICD-10-CM

## 2020-03-26 DIAGNOSIS — J45909 Unspecified asthma, uncomplicated: Secondary | ICD-10-CM | POA: Diagnosis present

## 2020-03-26 DIAGNOSIS — M79661 Pain in right lower leg: Secondary | ICD-10-CM | POA: Diagnosis present

## 2020-03-26 DIAGNOSIS — Z833 Family history of diabetes mellitus: Secondary | ICD-10-CM

## 2020-03-26 DIAGNOSIS — Z20822 Contact with and (suspected) exposure to covid-19: Secondary | ICD-10-CM | POA: Diagnosis present

## 2020-03-26 DIAGNOSIS — N809 Endometriosis, unspecified: Secondary | ICD-10-CM | POA: Diagnosis present

## 2020-03-26 DIAGNOSIS — K5900 Constipation, unspecified: Secondary | ICD-10-CM | POA: Diagnosis present

## 2020-03-26 DIAGNOSIS — K5792 Diverticulitis of intestine, part unspecified, without perforation or abscess without bleeding: Secondary | ICD-10-CM | POA: Diagnosis present

## 2020-03-26 DIAGNOSIS — I10 Essential (primary) hypertension: Secondary | ICD-10-CM | POA: Diagnosis present

## 2020-03-26 DIAGNOSIS — Z6841 Body Mass Index (BMI) 40.0 and over, adult: Secondary | ICD-10-CM

## 2020-03-26 DIAGNOSIS — Z841 Family history of disorders of kidney and ureter: Secondary | ICD-10-CM

## 2020-03-26 DIAGNOSIS — E039 Hypothyroidism, unspecified: Secondary | ICD-10-CM | POA: Diagnosis present

## 2020-03-26 DIAGNOSIS — Z8249 Family history of ischemic heart disease and other diseases of the circulatory system: Secondary | ICD-10-CM

## 2020-03-26 DIAGNOSIS — Z79899 Other long term (current) drug therapy: Secondary | ICD-10-CM

## 2020-03-26 DIAGNOSIS — Z885 Allergy status to narcotic agent status: Secondary | ICD-10-CM

## 2020-03-26 DIAGNOSIS — K5720 Diverticulitis of large intestine with perforation and abscess without bleeding: Principal | ICD-10-CM | POA: Diagnosis present

## 2020-03-26 DIAGNOSIS — E282 Polycystic ovarian syndrome: Secondary | ICD-10-CM | POA: Diagnosis present

## 2020-03-26 LAB — I-STAT BETA HCG BLOOD, ED (MC, WL, AP ONLY): I-stat hCG, quantitative: <5 m[IU]/mL (ref <5)

## 2020-03-26 LAB — CBC
HCT: 45.6 % (ref 36.0–46.0)
Hemoglobin: 14.8 g/dL (ref 12.0–15.0)
MCH: 31 pg (ref 26.0–34.0)
MCHC: 32.5 g/dL (ref 30.0–36.0)
MCV: 95.6 fL (ref 80.0–100.0)
Platelets: 189 10*3/uL (ref 150–400)
RBC: 4.77 MIL/uL (ref 3.87–5.11)
RDW: 12.5 % (ref 11.5–15.5)
WBC: 5.2 10*3/uL (ref 4.0–10.5)
nRBC: 0 % (ref 0.0–0.2)

## 2020-03-26 LAB — COMPREHENSIVE METABOLIC PANEL
ALT: 21 U/L (ref 0–44)
AST: 23 U/L (ref 15–41)
Albumin: 4 g/dL (ref 3.5–5.0)
Alkaline Phosphatase: 63 U/L (ref 38–126)
Anion gap: 10 (ref 5–15)
BUN: 7 mg/dL (ref 6–20)
CO2: 22 mmol/L (ref 22–32)
Calcium: 9 mg/dL (ref 8.9–10.3)
Chloride: 105 mmol/L (ref 98–111)
Creatinine, Ser: 0.7 mg/dL (ref 0.44–1.00)
GFR calc Af Amer: >60 mL/min (ref >60)
GFR calc non Af Amer: >60 mL/min (ref >60)
Glucose, Bld: 87 mg/dL (ref 70–99)
Potassium: 3.7 mmol/L (ref 3.5–5.1)
Sodium: 137 mmol/L (ref 135–145)
Total Bilirubin: 0.9 mg/dL (ref 0.3–1.2)
Total Protein: 8 g/dL (ref 6.5–8.1)

## 2020-03-26 LAB — URINALYSIS, ROUTINE W REFLEX MICROSCOPIC
Bilirubin Urine: NEGATIVE
Glucose, UA: NEGATIVE mg/dL
Hgb urine dipstick: NEGATIVE
Ketones, ur: NEGATIVE mg/dL
Nitrite: NEGATIVE
Protein, ur: NEGATIVE mg/dL
Specific Gravity, Urine: 1.014 (ref 1.005–1.030)
pH: 6 (ref 5.0–8.0)

## 2020-03-26 LAB — LIPASE, BLOOD: Lipase: 26 U/L (ref 11–51)

## 2020-03-26 MED ORDER — FENTANYL CITRATE (PF) 100 MCG/2ML IJ SOLN
50.0000 ug | Freq: Once | INTRAMUSCULAR | Status: AC
  Administered 2020-03-26: 50 ug via INTRAVENOUS
  Filled 2020-03-26: qty 2

## 2020-03-26 MED ORDER — SODIUM CHLORIDE (PF) 0.9 % IJ SOLN
INTRAMUSCULAR | Status: AC
  Filled 2020-03-26: qty 50

## 2020-03-26 MED ORDER — SODIUM CHLORIDE 0.9% FLUSH: 3.0000 mL | Freq: Once | INTRAVENOUS | Status: DC

## 2020-03-26 MED ORDER — PIPERACILLIN-TAZOBACTAM 3.375 G IVPB 30 MIN
3.3750 g | Freq: Once | INTRAVENOUS | Status: AC
  Administered 2020-03-27: 3.375 g via INTRAVENOUS
  Filled 2020-03-26: qty 50

## 2020-03-26 MED ORDER — IOHEXOL 300 MG/ML  SOLN
100.0000 mL | Freq: Once | INTRAMUSCULAR | Status: AC | PRN
  Administered 2020-03-26: 100 mL via INTRAVENOUS

## 2020-03-26 MED ORDER — DICYCLOMINE HCL 10 MG PO CAPS
10.0000 mg | ORAL_CAPSULE | Freq: Once | ORAL | Status: AC
  Administered 2020-03-26: 10 mg via ORAL
  Filled 2020-03-26: qty 1

## 2020-03-26 MED ORDER — FENTANYL CITRATE (PF) 100 MCG/2ML IJ SOLN
100.0000 ug | Freq: Once | INTRAMUSCULAR | Status: AC
  Administered 2020-03-26: 100 ug via INTRAVENOUS
  Filled 2020-03-26: qty 2

## 2020-03-26 NOTE — ED Provider Notes (Signed)
[facility]-EMERGENCY DEPT Provider Note   CSN: [id number] Arrival date & time: 03/26/20  1529     History Chief Complaint  Patient presents with  . Abdominal Pain  . Constipation  . Dysuria    [place] C Kalata is a 34 y.o. female.  The history is provided by the patient and medical records. No language interpreter was used.  Abdominal Pain Associated symptoms: constipation and dysuria   Constipation Associated symptoms: abdominal pain and dysuria   Dysuria Associated symptoms: abdominal pain    [place] C Amadon is a 34 y.o. female who presents to the Emergency Department complaining of abdominal pain. She presents to the ED complaining of abdominal pain that began three days ago.  Pain is described as sharp and burning in nature.  It is located in her left llq, luq.  She has associated nausea, decreased caliber stool, dysuria.  She was seen in the ED for similar sxs on 6/4 and she was diagnosed with diverticulitis and started on abx.  She states that initially her sxs were improving with the abx until three days ago.  She had vomiting yesterday.  She had a temperature to 99 on Saturday.  She feels like she has a yeast infection from the antibiotics.  She is sexually active.  No new sexual partners.  Sxs are severe in nature.      Past Medical History:  Diagnosis Date  . Anemia    history of anemia  . Anxiety   . Asthma    inhaler as needed  . Chest pain of uncertain etiology    intermittent left side chest pain  . Chlamydia   . Depression   . Endometriosis   . Genital herpes   . Headache    MIGRAINES  . Hypertension    no meds since April. needs new px per pt   . Hypothyroidism   . Morbid obesity (HCC)   . Obese   . Obesity, Class III, BMI 40-49.9 (morbid obesity) (HCC) 11/06/2011  . PCOS (polycystic ovarian syndrome)   . PONV (postoperative nausea and vomiting)   . Shortness of breath    on exertion from weight  . Thyroid disease   . Urinary tract  infection     Patient Active Problem List   Diagnosis Date Noted  . Diverticulitis 03/26/2020  . Obesity, morbid, BMI 50 or higher (HCC) 08/08/2019  . Endometriosis s/p ablation 08/08/2019  . Dysuria 08/08/2019  . Family history of colon cancer in father 08/08/2019  . Diverticulitis of intestine with abscess 08/07/2019  . Incomplete bladder emptying 02/18/2018  . DUB (dysfunctional uterine bleeding) 02/18/2018  . Anemia 02/18/2018  . Hypothyroid 01/29/2017  . Migraines 08/26/2016  . Chest pain 02/04/2016  . Abnormal uterine bleeding (AUB) 01/09/2014  . Thickened endometrium 12/02/2013  . Essential hypertension 10/21/2013  . PCOS (polycystic ovarian syndrome) 11/06/2011  . Infertility, female 11/06/2011  . Pelvic pain in female 08/01/2011    Past Surgical History:  Procedure Laterality Date  . DILATION AND CURETTAGE OF UTERUS    . DILATION AND CURETTAGE OF UTERUS N/A 04/29/2018   Procedure: DILATATION AND CURETTAGE;  Surgeon: Allie Bossier, MD;  Location: [facility];  Service: Gynecology;  Laterality: N/A;  . HYSTEROSCOPY WITH D & C N/A 04/06/2014   Procedure: DILATATION AND CURETTAGE /HYSTEROSCOPY ;  Surgeon: Tereso Newcomer, MD;  Location: [facility];  Service: Gynecology;  Laterality: N/A;  . LAPAROSCOPY N/A 04/29/2018   Procedure: LAPAROSCOPY DIAGNOSTIC WITH PERITONEAL  BIOPSY;  Surgeon: [name], MD;  Location: [facility];  Service: Gynecology;  Laterality: N/A;  . LEFT HEART CATH AND CORONARY ANGIOGRAPHY N/A 12/12/2016   Procedure: Left Heart Cath and Coronary Angiography;  Surgeon: Peter M Martinique, MD;  Location: [facility];  Service: Cardiovascular;  Laterality: N/A;  . NO PAST SURGERIES    . WISDOM TOOTH EXTRACTION       OB History    Gravida  0   Para  0   Term  0   Preterm  0   AB  0   Living  0     SAB  0   TAB  0   Ectopic  0   Multiple  0   Live Births  0           Family History  Problem Relation Age of Onset  . Heart disease Mother   .  Sleep apnea Mother   . Depression Mother   . Hypertension Mother   . Kidney disease Mother   . Diabetes Father   . Heart disease Father   . Colon cancer Father        37s  . Hypertension Father   . Heart disease Maternal Grandmother   . Diabetes Maternal Grandfather   . Heart disease Maternal Grandfather   . Diabetes Sister   . Hypertension Sister     Social History   Tobacco Use  . Smoking status: Former Research scientist (life sciences)  . Smokeless tobacco: Never Used  . Tobacco comment: 2-3 cigerettes a week.  Vaping Use  . Vaping Use: Never used  Substance Use Topics  . Alcohol use: Yes    Alcohol/week: 0.0 standard drinks    Comment: occasonal   . Drug use: Not Currently    Types: Marijuana    Home Medications Prior to Admission medications   Medication Sig Start Date End Date Taking? Authorizing Provider  albuterol (PROVENTIL HFA;VENTOLIN HFA) 108 (90 Base) MCG/ACT inhaler Inhale 2 puffs into the lungs every 6 (six) hours as needed for wheezing or shortness of breath. 01/19/18  Yes [name], NP  ibuprofen (ADVIL) 200 MG tablet Take 800 mg by mouth every 6 (six) hours as needed for moderate pain.   Yes [provider]  levothyroxine (SYNTHROID) 88 MCG tablet Take 1 tablet (88 mcg total) by mouth daily before breakfast. 11/27/19 03/26/20 Yes [name], NP  amoxicillin-clavulanate (AUGMENTIN) 875-125 MG tablet Take 1 tablet by mouth every 12 (twelve) hours. Patient not taking: Reported on 03/26/2020 03/17/20   Delia Heady, PA-C  ondansetron (ZOFRAN ODT) 4 MG disintegrating tablet Take 1 tablet (4 mg total) by mouth every 8 (eight) hours as needed for nausea or vomiting. Patient not taking: Reported on 03/26/2020 03/17/20   Delia Heady, PA-C  oxyCODONE-acetaminophen (PERCOCET/ROXICET) 5-325 MG tablet Take 1 tablet by mouth every 8 (eight) hours as needed for severe pain. Patient not taking: Reported on 03/26/2020 03/17/20   Delia Heady, PA-C    Allergies    Dilaudid  [hydromorphone]  Review of Systems   Review of Systems  Gastrointestinal: Positive for abdominal pain and constipation.  Genitourinary: Positive for dysuria.  All other systems reviewed and are negative.   Physical Exam Updated Vital Signs BP (!) 146/79   Pulse 72   Temp 98 F (36.7 C) (Oral)   Resp 20   Ht 5\' 6"  (1.676 m)   Wt (!) 147.4 kg   SpO2 100%   BMI 52.46 kg/m  Physical Exam Vitals and nursing note reviewed.  Constitutional:      Appearance: She is well-developed.  HENT:     Head: Normocephalic and atraumatic.  Cardiovascular:     Rate and Rhythm: Normal rate and regular rhythm.     Heart sounds: No murmur heard.   Pulmonary:     Effort: Pulmonary effort is normal. No respiratory distress.     Breath sounds: Normal breath sounds.  Abdominal:     Palpations: Abdomen is soft.     Tenderness: There is no guarding or rebound.     Comments: Generalized abdominal tenderness  Musculoskeletal:        General: No tenderness.  Skin:    General: Skin is warm and dry.  Neurological:     Mental Status: She is alert and oriented to person, place, and time.  Psychiatric:        Behavior: Behavior normal.     ED Results / Procedures / Treatments   Labs (all labs ordered are listed, but only abnormal results are displayed) Labs Reviewed  URINALYSIS, ROUTINE W REFLEX MICROSCOPIC - Abnormal; Notable for the following components:      Result Value   Leukocytes,Ua MODERATE (*)    Bacteria, UA RARE (*)    All other components within normal limits  SARS CORONAVIRUS 2 BY RT PCR (HOSPITAL ORDER, PERFORMED IN [facility])  LIPASE, BLOOD  COMPREHENSIVE METABOLIC PANEL  CBC  I-STAT BETA HCG BLOOD, ED ([facility], [facility], [facility] ONLY)    EKG None  Radiology CT Abdomen Pelvis W Contrast  Result Date: 03/26/2020 CLINICAL DATA:  Left lower quadrant pain 2-3 weeks EXAM: CT ABDOMEN AND PELVIS WITH CONTRAST TECHNIQUE: Multidetector CT imaging of the abdomen and pelvis was  performed using the standard protocol following bolus administration of intravenous contrast. CONTRAST:  OMNIPAQUE IOHEXOL 300 MG/ML  SOLN COMPARISON:  March 17, 2020 FINDINGS: Lower chest: The visualized heart size within normal limits. No pericardial fluid/thickening. No hiatal hernia. The visualized portions of the lungs are clear. Hepatobiliary: The liver is normal in density without focal abnormality.The main portal vein is patent. No evidence of calcified gallstones, gallbladder wall thickening or biliary dilatation. Pancreas: Unremarkable. No pancreatic ductal dilatation or surrounding inflammatory changes. Spleen: Normal in size without focal abnormality. Adrenals/Urinary Tract: Both adrenal glands appear normal. The kidneys and collecting system appear normal without evidence of urinary tract calculus or hydronephrosis. There is mild wall thickening seen at the superior left bladder wall with fat stranding changes likely from the diverticular stranding. Stomach/Bowel: The stomach and small bowel are normal in appearance. Again noted is a a focal 7 cm segment of sigmoid colon with diffuse wall thickening and surrounding fat stranding changes. There appears to be a heterogeneous ill-defined fluid pocket with possible loculation seen now adjacent to the posterior sigmoid colon, series 2, image 79 which could be phlegmon/early abscess. No pneumoperitoneum. Vascular/Lymphatic: There are no enlarged mesenteric, retroperitoneal, or pelvic lymph nodes. No significant vascular findings are present. Reproductive: The uterus and adnexa are unremarkable. Other: A small amount of free fluid seen within the deep pelvis. Musculoskeletal: No acute or significant osseous findings. IMPRESSION: Again noted is acute sigmoid colonic diverticulitis now with a possible tiny loculated/ill-defined fluid collection which could be early phlegmon/abscess. No definite pneumoperitoneum. Small amount of free fluid in the deep pelvis.  Mild wall thickening at the superior bladder wall, likely from adjacent inflammatory changes. Electronically Signed   By: Jonna Clark M.D.   On:  03/26/2020 22:16    Procedures Procedures (including critical care time)  Medications Ordered in ED Medications  sodium chloride flush (NS) 0.9 % injection 3 mL (has no administration in time range)  sodium chloride (PF) 0.9 % injection (has no administration in time range)  dicyclomine (BENTYL) capsule 10 mg (10 mg Oral Given 03/26/20 2149)  fentaNYL (SUBLIMAZE) injection 50 mcg (50 mcg Intravenous Given 03/26/20 2154)  iohexol (OMNIPAQUE) 300 MG/ML solution 100 mL (100 mLs Intravenous Contrast Given 03/26/20 2201)  piperacillin-tazobactam (ZOSYN) IVPB 3.375 g (0 g Intravenous Stopped 03/27/20 0036)  fentaNYL (SUBLIMAZE) injection 100 mcg (100 mcg Intravenous Given 03/26/20 2358)    ED Course  I have reviewed the triage vital signs and the nursing notes.  Pertinent labs & imaging results that were available during my care of the patient were reviewed by me and considered in my medical decision making (see chart for details).    MDM Rules/Calculators/A&P                         Patient with history of diverticulitis here for evaluation of worsening left-sided abdominal pain with vomiting. She does have tenderness on examination without peritoneal findings. Labs are near her baseline. CT demonstrates diverticulitis with possible developing phlegmon, will start IV antibiotics. Hospitalist consulted for admission. Patient updated findings of studies recommendation for admission and she is in agreement treatment plan.  Final Clinical Impression(s) / ED Diagnoses Final diagnoses:  Acute diverticulitis    Rx / DC Orders ED Discharge Orders    None       Tilden Fossa, MD 03/27/20 0109

## 2020-03-26 NOTE — ED Notes (Signed)
Provided pt w/labeled specimen cup for urine collection. ENMiles 

## 2020-03-26 NOTE — ED Triage Notes (Signed)
Patient c/o LLQ and mid abdominal pain x 2-3 weeks. Patient states she received oral antibiotics as prescribed when she wa seen in the ED a week ago.  Patient states she is now constipated and last BM was 2 days ago. Patient also reports dysuria since yesterday.

## 2020-03-27 ENCOUNTER — Encounter (HOSPITAL_COMMUNITY): Payer: Self-pay | Admitting: Internal Medicine

## 2020-03-27 DIAGNOSIS — K5792 Diverticulitis of intestine, part unspecified, without perforation or abscess without bleeding: Secondary | ICD-10-CM

## 2020-03-27 DIAGNOSIS — E876 Hypokalemia: Secondary | ICD-10-CM

## 2020-03-27 DIAGNOSIS — E039 Hypothyroidism, unspecified: Secondary | ICD-10-CM

## 2020-03-27 DIAGNOSIS — I10 Essential (primary) hypertension: Secondary | ICD-10-CM

## 2020-03-27 LAB — SARS CORONAVIRUS 2 BY RT PCR (HOSPITAL ORDER, PERFORMED IN ~~LOC~~ HOSPITAL LAB): SARS Coronavirus 2: NEGATIVE

## 2020-03-27 LAB — COMPREHENSIVE METABOLIC PANEL
ALT: 21 U/L (ref 0–44)
AST: 25 U/L (ref 15–41)
Albumin: 3.7 g/dL (ref 3.5–5.0)
Alkaline Phosphatase: 57 U/L (ref 38–126)
Anion gap: 10 (ref 5–15)
BUN: 7 mg/dL (ref 6–20)
CO2: 22 mmol/L (ref 22–32)
Calcium: 8.8 mg/dL — ABNORMAL LOW (ref 8.9–10.3)
Chloride: 106 mmol/L (ref 98–111)
Creatinine, Ser: 0.75 mg/dL (ref 0.44–1.00)
GFR calc Af Amer: >60 mL/min (ref >60)
GFR calc non Af Amer: >60 mL/min (ref >60)
Glucose, Bld: 97 mg/dL (ref 70–99)
Potassium: 3.4 mmol/L — ABNORMAL LOW (ref 3.5–5.1)
Sodium: 138 mmol/L (ref 135–145)
Total Bilirubin: 0.7 mg/dL (ref 0.3–1.2)
Total Protein: 7.4 g/dL (ref 6.5–8.1)

## 2020-03-27 LAB — CBC WITH DIFFERENTIAL/PLATELET
Abs Immature Granulocytes: 0.01 10*3/uL (ref 0.00–0.07)
Basophils Absolute: 0 10*3/uL (ref 0.0–0.1)
Basophils Relative: 1 %
Eosinophils Absolute: 0.1 10*3/uL (ref 0.0–0.5)
Eosinophils Relative: 2 %
HCT: 44.1 % (ref 36.0–46.0)
Hemoglobin: 14.5 g/dL (ref 12.0–15.0)
Immature Granulocytes: 0 %
Lymphocytes Relative: 50 %
Lymphs Abs: 2.1 10*3/uL (ref 0.7–4.0)
MCH: 31.7 pg (ref 26.0–34.0)
MCHC: 32.9 g/dL (ref 30.0–36.0)
MCV: 96.5 fL (ref 80.0–100.0)
Monocytes Absolute: 0.5 10*3/uL (ref 0.1–1.0)
Monocytes Relative: 12 %
Neutro Abs: 1.5 10*3/uL — ABNORMAL LOW (ref 1.7–7.7)
Neutrophils Relative %: 35 %
Platelets: 169 10*3/uL (ref 150–400)
RBC: 4.57 MIL/uL (ref 3.87–5.11)
RDW: 12.5 % (ref 11.5–15.5)
WBC: 4.2 10*3/uL (ref 4.0–10.5)
nRBC: 0 % (ref 0.0–0.2)

## 2020-03-27 MED ORDER — ONDANSETRON HCL 4 MG PO TABS: 4.0000 mg | ORAL_TABLET | Freq: Four times a day (QID) | ORAL | Status: DC | PRN

## 2020-03-27 MED ORDER — PIPERACILLIN-TAZOBACTAM 3.375 G IVPB
3.3750 g | Freq: Three times a day (TID) | INTRAVENOUS | Status: DC
  Administered 2020-03-27 – 2020-04-01 (×16): 3.375 g via INTRAVENOUS
  Filled 2020-03-27 (×15): qty 50

## 2020-03-27 MED ORDER — FENTANYL CITRATE (PF) 100 MCG/2ML IJ SOLN
25.0000 ug | INTRAMUSCULAR | Status: DC | PRN
  Administered 2020-03-27 – 2020-03-30 (×2): 25 ug via INTRAVENOUS
  Filled 2020-03-27 (×2): qty 2

## 2020-03-27 MED ORDER — DEXTROSE-NACL 5-0.9 % IV SOLN: INTRAVENOUS | Status: DC

## 2020-03-27 MED ORDER — HYDRALAZINE HCL 20 MG/ML IJ SOLN
10.0000 mg | INTRAMUSCULAR | Status: DC | PRN
  Administered 2020-03-27: 10 mg via INTRAVENOUS
  Filled 2020-03-27: qty 1

## 2020-03-27 MED ORDER — ACETAMINOPHEN 325 MG PO TABS
650.0000 mg | ORAL_TABLET | Freq: Four times a day (QID) | ORAL | Status: DC | PRN
  Administered 2020-03-30: 650 mg via ORAL
  Filled 2020-03-27: qty 2

## 2020-03-27 MED ORDER — POTASSIUM CHLORIDE 10 MEQ/100ML IV SOLN
10.0000 meq | INTRAVENOUS | Status: AC
  Administered 2020-03-27 (×3): 10 meq via INTRAVENOUS
  Filled 2020-03-27 (×3): qty 100

## 2020-03-27 MED ORDER — OXYCODONE-ACETAMINOPHEN 7.5-325 MG PO TABS
1.0000 | ORAL_TABLET | Freq: Four times a day (QID) | ORAL | Status: DC | PRN
  Administered 2020-03-27 – 2020-03-30 (×8): 1 via ORAL
  Filled 2020-03-27 (×8): qty 1

## 2020-03-27 MED ORDER — ONDANSETRON HCL 4 MG/2ML IJ SOLN
4.0000 mg | Freq: Four times a day (QID) | INTRAMUSCULAR | Status: DC | PRN
  Administered 2020-03-27 – 2020-03-29 (×2): 4 mg via INTRAVENOUS
  Filled 2020-03-27 (×2): qty 2

## 2020-03-27 MED ORDER — LEVOTHYROXINE SODIUM 100 MCG/5ML IV SOLN
44.0000 ug | Freq: Every day | INTRAVENOUS | Status: DC
  Administered 2020-03-27 – 2020-03-28 (×2): 44 ug via INTRAVENOUS
  Filled 2020-03-27 (×2): qty 5

## 2020-03-27 NOTE — H&P (Signed)
History and Physical    Lori Mcknight JJO:[id number] DOB: Dec 29, 1985 DOA: 03/26/2020  PCP: Claiborne Rigg, NP  Patient coming from: Home.  Chief Complaint: Abdominal pain.  HPI: Lori Mcknight is a 34 y.o. female with history of hypertension hypothyroidism admitted in October 2020 for acute sigmoid diverticulitis at the time patient had microperforation managed conservatively presents to the ER for the second time in 10 days with worsening abdominal pain.  Pain is mostly in the left lower quadrant.  Initially when the pain started patient had nausea vomiting and diarrhea for 3 days.  Had come to the ER on March 18, 2019 and was discharged home on oral antibiotics after CT scan showed diverticulitis.  Despite taking which patient's pain did not improve.  Patient initially improved but started to worsen again.  ED Course: In the ER on exam patient has significant left lower quadrant tenderness and guarding.  CT abdomen pelvis shows acute sigmoid diverticulitis with possible abscess formation.  Covid test is negative.  Patient started on empiric antibiotics for acute sigmoid diverticulitis with abscess formation.  Labs show largely unremarkable complete metabolic panel and CBC.  Review of Systems: As per HPI, rest all negative.   Past Medical History:  Diagnosis Date  . Anemia    history of anemia  . Anxiety   . Asthma    inhaler as needed  . Chest pain of uncertain etiology    intermittent left side chest pain  . Chlamydia   . Depression   . Endometriosis   . Genital herpes   . Headache    MIGRAINES  . Hypertension    no meds since April. needs new px per pt   . Hypothyroidism   . Morbid obesity (HCC)   . Obese   . Obesity, Class III, BMI 40-49.9 (morbid obesity) (HCC) 11/06/2011  . PCOS (polycystic ovarian syndrome)   . PONV (postoperative nausea and vomiting)   . Shortness of breath    on exertion from weight  . Thyroid disease   . Urinary tract infection     Past Surgical  History:  Procedure Laterality Date  . DILATION AND CURETTAGE OF UTERUS    . DILATION AND CURETTAGE OF UTERUS N/A 04/29/2018   Procedure: DILATATION AND CURETTAGE;  Surgeon: Allie Bossier, MD;  Location: [facility];  Service: Gynecology;  Laterality: N/A;  . HYSTEROSCOPY WITH D & C N/A 04/06/2014   Procedure: DILATATION AND CURETTAGE /HYSTEROSCOPY ;  Surgeon: Tereso Newcomer, MD;  Location: [facility];  Service: Gynecology;  Laterality: N/A;  . LAPAROSCOPY N/A 04/29/2018   Procedure: LAPAROSCOPY DIAGNOSTIC WITH PERITONEAL BIOPSY;  Surgeon: Allie Bossier, MD;  Location: [facility];  Service: Gynecology;  Laterality: N/A;  . LEFT HEART CATH AND CORONARY ANGIOGRAPHY N/A 12/12/2016   Procedure: Left Heart Cath and Coronary Angiography;  Surgeon: Peter M Swaziland, MD;  Location: [facility];  Service: Cardiovascular;  Laterality: N/A;  . NO PAST SURGERIES    . WISDOM TOOTH EXTRACTION       reports that she has quit smoking. She has never used smokeless tobacco. She reports current alcohol use. She reports previous drug use. Drug: Marijuana.  Allergies  Allergen Reactions  . Dilaudid [Hydromorphone] Rash    Itching all over body    Family History  Problem Relation Age of Onset  . Heart disease Mother   . Sleep apnea Mother   . Depression Mother   . Hypertension Mother   .  Kidney disease Mother   . Diabetes Father   . Heart disease Father   . Colon cancer Father        27s  . Hypertension Father   . Heart disease Maternal Grandmother   . Diabetes Maternal Grandfather   . Heart disease Maternal Grandfather   . Diabetes Sister   . Hypertension Sister     Prior to Admission medications   Medication Sig Start Date End Date Taking? Authorizing Provider  albuterol (PROVENTIL HFA;VENTOLIN HFA) 108 (90 Base) MCG/ACT inhaler Inhale 2 puffs into the lungs every 6 (six) hours as needed for wheezing or shortness of breath. 01/19/18  Yes [name], NP  ibuprofen (ADVIL) 200 MG tablet Take 800 mg by  mouth every 6 (six) hours as needed for moderate pain.   Yes [provider]  levothyroxine (SYNTHROID) 88 MCG tablet Take 1 tablet (88 mcg total) by mouth daily before breakfast. 11/27/19 03/26/20 Yes [name], NP  amoxicillin-clavulanate (AUGMENTIN) 875-125 MG tablet Take 1 tablet by mouth every 12 (twelve) hours. Patient not taking: Reported on 03/26/2020 03/17/20   Delia Heady, PA-C  ondansetron (ZOFRAN ODT) 4 MG disintegrating tablet Take 1 tablet (4 mg total) by mouth every 8 (eight) hours as needed for nausea or vomiting. Patient not taking: Reported on 03/26/2020 03/17/20   Delia Heady, PA-C  oxyCODONE-acetaminophen (PERCOCET/ROXICET) 5-325 MG tablet Take 1 tablet by mouth every 8 (eight) hours as needed for severe pain. Patient not taking: Reported on 03/26/2020 03/17/20   Delia Heady, PA-C    Physical Exam: Constitutional: Moderately built and nourished. Vitals:   03/26/20 2330 03/27/20 0121 03/27/20 0521 03/27/20 0521  BP: (!) 146/79 (!) 124/106 (!) 169/118 (!) 169/118  Pulse: 72 79 72 65  Resp: 20 17 15 15   Temp:  98.1 F (36.7 C) 98.6 F (37 C) 98.6 F (37 C)  TempSrc:  Oral Oral Oral  SpO2: 100% 100% 98% 100%  Weight:      Height:       Eyes: Anicteric no pallor. ENMT: No discharge from the ears eyes nose or mouth. Neck: No mass felt.  No neck rigidity. Respiratory: No rhonchi or crepitations. Cardiovascular: S1-S2 heard. Abdomen: Left lower quadrant tenderness guarding present. Musculoskeletal: No edema. Skin: No rash. Neurologic: Alert awake oriented time place and person.  Moves all extremities. Psychiatric: Appears normal.   Labs on Admission: I have personally reviewed following labs and imaging studies  CBC: Recent Labs  Lab 03/26/20 1710  WBC 5.2  HGB 14.8  HCT 45.6  MCV 95.6  PLT 175   Basic Metabolic Panel: Recent Labs  Lab 03/26/20 1710  NA 137  K 3.7  CL 105  CO2 22  GLUCOSE 87  BUN 7  CREATININE 0.70  CALCIUM 9.0    GFR: Estimated Creatinine Clearance: 147.8 mL/min (by C-G formula based on SCr of 0.7 mg/dL). Liver Function Tests: Recent Labs  Lab 03/26/20 1710  AST 23  ALT 21  ALKPHOS 63  BILITOT 0.9  PROT 8.0  ALBUMIN 4.0   Recent Labs  Lab 03/26/20 1710  LIPASE 26   No results for input(s): AMMONIA in the last 168 hours. Coagulation Profile: No results for input(s): INR, PROTIME in the last 168 hours. Cardiac Enzymes: No results for input(s): CKTOTAL, CKMB, CKMBINDEX, TROPONINI in the last 168 hours. BNP (last 3 results) No results for input(s): PROBNP in the last 8760 hours. HbA1C: No results for input(s): HGBA1C in the last 72 hours. CBG:  No results for input(s): GLUCAP in the last 168 hours. Lipid Profile: No results for input(s): CHOL, HDL, LDLCALC, TRIG, CHOLHDL, LDLDIRECT in the last 72 hours. Thyroid Function Tests: No results for input(s): TSH, T4TOTAL, FREET4, T3FREE, THYROIDAB in the last 72 hours. Anemia Panel: No results for input(s): VITAMINB12, FOLATE, FERRITIN, TIBC, IRON, RETICCTPCT in the last 72 hours. Urine analysis:    Component Value Date/Time   COLORURINE YELLOW 03/26/2020 2230   APPEARANCEUR CLEAR 03/26/2020 2230   APPEARANCEUR Clear 03/08/2020 1515   LABSPEC 1.014 03/26/2020 2230   PHURINE 6.0 03/26/2020 2230   GLUCOSEU NEGATIVE 03/26/2020 2230   HGBUR NEGATIVE 03/26/2020 2230   BILIRUBINUR NEGATIVE 03/26/2020 2230   BILIRUBINUR Negative 03/08/2020 1515   KETONESUR NEGATIVE 03/26/2020 2230   PROTEINUR NEGATIVE 03/26/2020 2230   UROBILINOGEN 0.2 07/29/2019 1321   NITRITE NEGATIVE 03/26/2020 2230   LEUKOCYTESUR MODERATE (A) 03/26/2020 2230   Sepsis Labs: @LABRCNTIP (procalcitonin:4,lacticidven:4) ) Recent Results (from the past 240 hour(s))  SARS Coronavirus 2 by RT PCR (hospital order, performed in [facility] hospital lab) Nasopharyngeal Nasopharyngeal Swab     Status: None   Collection Time: 03/26/20 11:02 PM   Specimen: Nasopharyngeal Swab   Result Value Ref Range Status   SARS Coronavirus 2 NEGATIVE NEGATIVE Final    Comment: (NOTE) SARS-CoV-2 target nucleic acids are NOT DETECTED.  The SARS-CoV-2 RNA is generally detectable in upper and lower respiratory specimens during the acute phase of infection. The lowest concentration of SARS-CoV-2 viral copies this assay can detect is 250 copies / mL. A negative result does not preclude SARS-CoV-2 infection and should not be used as the sole basis for treatment or other patient management decisions.  A negative result may occur with improper specimen collection / handling, submission of specimen other than nasopharyngeal swab, presence of viral mutation(s) within the areas targeted by this assay, and inadequate number of viral copies (<250 copies / mL). A negative result must be combined with clinical observations, patient history, and epidemiological information.  Fact Sheet for Patients:   03/28/20  Fact Sheet for Healthcare Providers: BoilerBrush.com.cy  This test is not yet approved or  cleared by the https://pope.com/ FDA and has been authorized for detection and/or diagnosis of SARS-CoV-2 by FDA under an Emergency Use Authorization (EUA).  This EUA will remain in effect (meaning this test can be used) for the duration of the COVID-19 declaration under Section 564(b)(1) of the Act, 21 U.S.C. section 360bbb-3(b)(1), unless the authorization is terminated or revoked sooner.  Performed at [facility], [address] [place], [place]      Radiological Exams on Admission: CT Abdomen Pelvis W Contrast  Result Date: 03/26/2020 CLINICAL DATA:  Left lower quadrant pain 2-3 weeks EXAM: CT ABDOMEN AND PELVIS WITH CONTRAST TECHNIQUE: Multidetector CT imaging of the abdomen and pelvis was performed using the standard protocol following bolus administration of intravenous contrast. CONTRAST:   03/28/2020 OMNIPAQUE IOHEXOL 300 MG/ML  SOLN COMPARISON:  March 17, 2020 FINDINGS: Lower chest: The visualized heart size within normal limits. No pericardial fluid/thickening. No hiatal hernia. The visualized portions of the lungs are clear. Hepatobiliary: The liver is normal in density without focal abnormality.The main portal vein is patent. No evidence of calcified gallstones, gallbladder wall thickening or biliary dilatation. Pancreas: Unremarkable. No pancreatic ductal dilatation or surrounding inflammatory changes. Spleen: Normal in size without focal abnormality. Adrenals/Urinary Tract: Both adrenal glands appear normal. The kidneys and collecting system appear normal without evidence of urinary tract calculus  or hydronephrosis. There is mild wall thickening seen at the superior left bladder wall with fat stranding changes likely from the diverticular stranding. Stomach/Bowel: The stomach and small bowel are normal in appearance. Again noted is a a focal 7 cm segment of sigmoid colon with diffuse wall thickening and surrounding fat stranding changes. There appears to be a heterogeneous ill-defined fluid pocket with possible loculation seen now adjacent to the posterior sigmoid colon, series 2, image 79 which could be phlegmon/early abscess. No pneumoperitoneum. Vascular/Lymphatic: There are no enlarged mesenteric, retroperitoneal, or pelvic lymph nodes. No significant vascular findings are present. Reproductive: The uterus and adnexa are unremarkable. Other: A small amount of free fluid seen within the deep pelvis. Musculoskeletal: No acute or significant osseous findings. IMPRESSION: Again noted is acute sigmoid colonic diverticulitis now with a possible tiny loculated/ill-defined fluid collection which could be early phlegmon/abscess. No definite pneumoperitoneum. Small amount of free fluid in the deep pelvis. Mild wall thickening at the superior bladder wall, likely from adjacent inflammatory changes.  Electronically Signed   By: Jonna Clark M.D.   On: 03/26/2020 22:16     Assessment/Plan Principal Problem:   Acute diverticulitis Active Problems:   Essential hypertension   Hypothyroid   Diverticulitis    1. Acute sigmoid diverticulitis with possible abscess formation.  Patient has had previous episode of sigmoid diverticulitis in October 2020 at that time patient had microperforations.  Was managed conservatively.  Patient is started on IV antibiotics IV fluids pain medication n.p.o.  May discuss with surgery if patient may need drainage catheter placement for the abscess. 2. Hypothyroidism on IV Synthroid until patient can take orally. 3. Hypertension we will keep patient on as needed IV hydralazine for systolic blood pressure more than 160. 4. Family history of colon cancer and died at age 5.  Advised about colonoscopy.  Given that patient has significant with sigmoid diverticulitis with possible abscess formation patient will need close monitoring for any further deterioration in inpatient status.   DVT prophylaxis: SCDs for now will avoid pharmacological DVT prophylaxis in anticipation of possible procedure for the possible abscess. Code Status: Full code. Family Communication: Discussed with patient. Disposition Plan: Home. Consults called: None. Admission status: Inpatient.   Eduard Clos MD [facility] Pager [phone].  If 7PM-7AM, please contact night-coverage [email or web address] Password [email or web address]  03/27/2020, 8:26 AM

## 2020-03-27 NOTE — ED Notes (Signed)
Report called for pt transfer to the floor  

## 2020-03-27 NOTE — Progress Notes (Signed)
Pharmacy Antibiotic Note  Lori Mcknight is a 34 y.o. female admitted on 03/26/2020 with intra-abdominal infection.  Pharmacy has been consulted for piperacillin/tazobactam dosing.  Today, 03/27/20 -WBC WNL -SCr 0.7, CrCl > 60 mL/min -Afebrile  Plan:  Piperacillin/tazobactam 3.375 g IV q8h EI  Pharmacy to sign off. Please re-consult if needed.   Height: 5\' 6"  (167.6 cm) Weight: (!) 147.4 kg (325 lb) IBW/kg (Calculated) : 59.3  Temp (24hrs), Avg:98.3 F (36.8 C), Min:98 F (36.7 C), Max:98.6 F (37 C)  Recent Labs  Lab 03/26/20 1710  WBC 5.2  CREATININE 0.70    Estimated Creatinine Clearance: 147.8 mL/min (by C-G formula based on SCr of 0.7 mg/dL).    Allergies  Allergen Reactions   Dilaudid [Hydromorphone] Rash    Itching all over body    Antimicrobials this admission: 6/15 pip/tazo >>   Thank you for allowing pharmacy to be a part of this patients care.  7/15, PharmD 03/27/2020 8:45 AM

## 2020-03-27 NOTE — Consult Note (Signed)
Lori Mcknight [birth year]  [id number].    Requesting MD: Dr. Toniann Fail Chief Complaint/Reason for Consult: Diverticulitis   HPI: Lori Mcknight is a 34 y.o. female with a history of diverticulitis, hypertension, hypothyroidism who presented to [facility] for worsening left lower quadrant abdominal pain.  Patient was first diagnosed with Diverticulitis in December 2019.  She was admitted in the past on 10/25-11/10/2018 for diverticulitis.  She did follow-up with Dr. Michaell Cowing in the office after hospitalization.  Plan was for colonoscopy as outpatient and then to follow up with him again.  She did not obtain colonoscopy.  She has never had a colonoscopy.  She notes she was doing well until 6/5 where she began having cramping/achy left lower quadrant abdominal pain at work.  She presented to the ED where she was found to have mild uncomplicated diverticulitis in the mid to lower sigmoid colon.  WBC 7.7.  She was prescribed Augmentin and has been taking this as prescribed at home.  She notes since 6/5 her pain has become more constant, severe and achy in nature.  Her pain is located in her left lower quadrant and times to gets to a 8/10. Her pain is worse with movement. Nothing makes it better. She notes associated subjective fever, chills as well as waves of nausea and 2 episodes of emesis.  Last episode of emesis was on Saturday, 6/12. She did have some diarrhea after initially starting antibiotics but is now having "small, stringy" BMs with the last being last night.  She is still passing flatus.  No pneumaturia or flecks of stool in her urine. She notes that she intermittently suffered from constipation throughout her life. She represented to the emergency department yesterday given worsening symptoms.  CT showed acute sigmoid diverticulitis with developing phlegmon.  WBC 5.2.  Patient was started on IV Zosyn admitted to the hospitalist service.  We were asked to consult. Patient is not on any blood  thinners. Prior abdominal surgery includes Lap endometrial ablation by Dr. Marice Potter in 2019. Hx of colon CA in her father in his 75's. No personal or family hx of IBD.   ROS: Review of Systems  Constitutional: Positive for chills and fever.  Respiratory: Negative for cough and shortness of breath.   Cardiovascular: Negative for chest pain and leg swelling.  Gastrointestinal: Positive for abdominal pain, constipation, diarrhea, nausea and vomiting. Negative for blood in stool and melena.  Genitourinary: Positive for dysuria. Negative for hematuria.  Musculoskeletal: Negative for back pain.  Skin: Negative for rash.  Psychiatric/Behavioral: Negative for substance abuse.  All other systems reviewed and are negative.   Family History  Problem Relation Age of Onset  . Heart disease Mother   . Sleep apnea Mother   . Depression Mother   . Hypertension Mother   . Kidney disease Mother   . Diabetes Father   . Heart disease Father   . Colon cancer Father        45s  . Hypertension Father   . Heart disease Maternal Grandmother   . Diabetes Maternal Grandfather   . Heart disease Maternal Grandfather   . Diabetes Sister   . Hypertension Sister     Past Medical History:  Diagnosis Date  . Anemia    history of anemia  . Anxiety   . Asthma    inhaler as needed  . Chest pain of uncertain etiology    intermittent left side chest pain  . Chlamydia   . Depression   .  Endometriosis   . Genital herpes   . Headache    MIGRAINES  . Hypertension    no meds since April. needs new px per pt   . Hypothyroidism   . Morbid obesity (HCC)   . Obese   . Obesity, Class III, BMI 40-49.9 (morbid obesity) (HCC) 11/06/2011  . PCOS (polycystic ovarian syndrome)   . PONV (postoperative nausea and vomiting)   . Shortness of breath    on exertion from weight  . Thyroid disease   . Urinary tract infection     Past Surgical History:  Procedure Laterality Date  . DILATION AND CURETTAGE OF UTERUS    .  DILATION AND CURETTAGE OF UTERUS N/A 04/29/2018   Procedure: DILATATION AND CURETTAGE;  Surgeon: Allie Bossier, MD;  Location: [facility];  Service: Gynecology;  Laterality: N/A;  . HYSTEROSCOPY WITH D & C N/A 04/06/2014   Procedure: DILATATION AND CURETTAGE /HYSTEROSCOPY ;  Surgeon: Tereso Newcomer, MD;  Location: [facility];  Service: Gynecology;  Laterality: N/A;  . LAPAROSCOPY N/A 04/29/2018   Procedure: LAPAROSCOPY DIAGNOSTIC WITH PERITONEAL BIOPSY;  Surgeon: Allie Bossier, MD;  Location: [facility];  Service: Gynecology;  Laterality: N/A;  . LEFT HEART CATH AND CORONARY ANGIOGRAPHY N/A 12/12/2016   Procedure: Left Heart Cath and Coronary Angiography;  Surgeon: Peter M Swaziland, MD;  Location: [facility] INVASIVE CV LAB;  Service: Cardiovascular;  Laterality: N/A;  . NO PAST SURGERIES    . WISDOM TOOTH EXTRACTION      Social History:  reports that she has quit smoking. She has never used smokeless tobacco. She reports current alcohol use. She reports previous drug use. Drug: Marijuana. Former smoker Works at [employer] socially No illicit drug use Lives alone  Allergies:  Allergies  Allergen Reactions  . Dilaudid [Hydromorphone] Rash    Itching all over body    Medications Prior to Admission  Medication Sig Dispense Refill  . albuterol (PROVENTIL HFA;VENTOLIN HFA) 108 (90 Base) MCG/ACT inhaler Inhale 2 puffs into the lungs every 6 (six) hours as needed for wheezing or shortness of breath. 1 Inhaler 2  . ibuprofen (ADVIL) 200 MG tablet Take 800 mg by mouth every 6 (six) hours as needed for moderate pain.    Marland Kitchen levothyroxine (SYNTHROID) 88 MCG tablet Take 1 tablet (88 mcg total) by mouth daily before breakfast. 30 tablet 1  . amoxicillin-clavulanate (AUGMENTIN) 875-125 MG tablet Take 1 tablet by mouth every 12 (twelve) hours. (Patient not taking: Reported on 03/26/2020) 14 tablet 0  . ondansetron (ZOFRAN ODT) 4 MG disintegrating tablet Take 1 tablet (4 mg total) by mouth every 8 (eight) hours as  needed for nausea or vomiting. (Patient not taking: Reported on 03/26/2020) 5 tablet 0  . oxyCODONE-acetaminophen (PERCOCET/ROXICET) 5-325 MG tablet Take 1 tablet by mouth every 8 (eight) hours as needed for severe pain. (Patient not taking: Reported on 03/26/2020) 10 tablet 0     Physical Exam: Blood pressure (!) 154/90, pulse 74, temperature 98.6 F (37 C), temperature source Oral, resp. rate 15, height  (1.676 m), weight (!) 147.4 kg, SpO2 96 %. General: pleasant, morbidly obese female who is laying in bed in NAD HEENT: head is normocephalic, atraumatic.  Sclera are noninjected.  PERRL.  Ears and nose without any masses or lesions.  Mouth is pink and moist. Dentition fair Heart: regular, rate, and rhythm.  Normal s1,s2. No obvious murmurs, gallops, or rubs noted.  Palpable pedal pulses bilaterally  Lungs: CTAB, no wheezes,  rhonchi, or rales noted.  Respiratory effort nonlabored Abd: Soft, obese abdomen that does not appear to be distended. There is tenderness of the periumbilical>suprapubic>LLQ. There is no rebound, rigidity or guarding. No peritonitis. +BS, no masses, hernias, or organomegaly. Prior laparoscopic incisions are well healed.  MS: no BUE/BLE edema, calves soft and nontender Skin: warm and dry with no masses, lesions, or rashes Psych: A&Ox4 with an appropriate affect Neuro: cranial nerves grossly intact, equal strength in BUE/BLE bilaterally, normal speech, though process intact  Results for orders placed or performed during the hospital encounter of 03/26/20 (from the past 48 hour(s))  Lipase, blood     Status: None   Collection Time: 03/26/20  5:10 PM  Result Value Ref Range   Lipase 26 11 - 51 U/L    Comment: Performed at [facility], [address] [place], [place] [address]  Comprehensive metabolic panel     Status: None   Collection Time: 03/26/20  5:10 PM  Result Value Ref Range   Sodium 137 135 - 145 mmol/L   Potassium 3.7 3.5 - 5.1 mmol/L     Chloride 105 98 - 111 mmol/L   CO2 22 22 - 32 mmol/L   Glucose, Bld 87 70 - 99 mg/dL    Comment: Glucose reference range applies only to samples taken after fasting for at least 8 hours.   BUN 7 6 - 20 mg/dL   Creatinine, Ser 6.040.70 0.44 - 1.00 mg/dL   Calcium 9.0 8.9 - 54.010.3 mg/dL   Total Protein 8.0 6.5 - 8.1 g/dL   Albumin 4.0 3.5 - 5.0 g/dL   AST 23 15 - 41 U/L   ALT 21 0 - 44 U/L   Alkaline Phosphatase 63 38 - 126 U/L   Total Bilirubin 0.9 0.3 - 1.2 mg/dL   GFR calc non Af Amer >60 >60 mL/min   GFR calc Af Amer >60 >60 mL/min   Anion gap 10 5 - 15    Comment: Performed at [facility], [address] [place], [place] [address]  CBC     Status: None   Collection Time: 03/26/20  5:10 PM  Result Value Ref Range   WBC 5.2 4.0 - 10.5 K/uL   RBC 4.77 3.87 - 5.11 MIL/uL   Hemoglobin 14.8 12.0 - 15.0 g/dL   HCT 14.745.6 36 - 46 %   MCV 95.6 80.0 - 100.0 fL   MCH 31.0 26.0 - 34.0 pg   MCHC 32.5 30.0 - 36.0 g/dL   RDW 82.912.5 56.211.5 - 13.015.5 %   Platelets 189 150 - 400 K/uL   nRBC 0.0 0.0 - 0.2 %    Comment: Performed at [facility], [address] [place], [place] [address]  I-Stat beta hCG blood, ED     Status: None   Collection Time: 03/26/20  5:15 PM  Result Value Ref Range   I-stat hCG, quantitative <5.0 <5 mIU/mL   Comment 3            Comment:   GEST. AGE      CONC.  (mIU/mL)   <=1 WEEK        5 - 50     2 WEEKS       50 - 500     3 WEEKS       100 - 10,000     4 WEEKS     1,000 - 30,000        FEMALE  AND NON-PREGNANT FEMALE:     LESS THAN 5 mIU/mL   Urinalysis, Routine w reflex microscopic     Status: Abnormal   Collection Time: 03/26/20 10:30 PM  Result Value Ref Range   Color, Urine YELLOW YELLOW   APPearance CLEAR CLEAR   Specific Gravity, Urine 1.014 1.005 - 1.030   pH 6.0 5.0 - 8.0   Glucose, UA NEGATIVE NEGATIVE mg/dL   Hgb urine dipstick NEGATIVE NEGATIVE   Bilirubin Urine NEGATIVE NEGATIVE   Ketones, ur NEGATIVE NEGATIVE  mg/dL   Protein, ur NEGATIVE NEGATIVE mg/dL   Nitrite NEGATIVE NEGATIVE   Leukocytes,Ua MODERATE (A) NEGATIVE   RBC / HPF 0-5 0 - 5 RBC/hpf   WBC, UA 0-5 0 - 5 WBC/hpf   Bacteria, UA RARE (A) NONE SEEN   Squamous Epithelial / LPF 6-10 0 - 5   Mucus PRESENT    Hyaline Casts, UA PRESENT     Comment: Performed at [facility], [address] [place], [place] [address]  SARS Coronavirus 2 by RT PCR (hospital order, performed in [facility] hospital lab) Nasopharyngeal Nasopharyngeal Swab     Status: None   Collection Time: 03/26/20 11:02 PM   Specimen: Nasopharyngeal Swab  Result Value Ref Range   SARS Coronavirus 2 NEGATIVE NEGATIVE    Comment: (NOTE) SARS-CoV-2 target nucleic acids are NOT DETECTED.  The SARS-CoV-2 RNA is generally detectable in upper and lower respiratory specimens during the acute phase of infection. The lowest concentration of SARS-CoV-2 viral copies this assay can detect is 250 copies / mL. A negative result does not preclude SARS-CoV-2 infection and should not be used as the sole basis for treatment or other patient management decisions.  A negative result may occur with improper specimen collection / handling, submission of specimen other than nasopharyngeal swab, presence of viral mutation(s) within the areas targeted by this assay, and inadequate number of viral copies (<250 copies / mL). A negative result must be combined with clinical observations, patient history, and epidemiological information.  Fact Sheet for Patients:   BoilerBrush.com.cy  Fact Sheet for Healthcare Providers: https://pope.com/  This test is not yet approved or  cleared by the [place] FDA and has been authorized for detection and/or diagnosis of SARS-CoV-2 by FDA under an Emergency Use Authorization (EUA).  This EUA will remain in effect (meaning this test can be used) for the duration of the COVID-19  declaration under Section 564(b)(1) of the Act, 21 U.S.C. section 360bbb-3(b)(1), unless the authorization is terminated or revoked sooner.  Performed at [facility], [address] [place], [place] [address]   CBC WITH DIFFERENTIAL     Status: Abnormal   Collection Time: 03/27/20  9:31 AM  Result Value Ref Range   WBC 4.2 4.0 - 10.5 K/uL   RBC 4.57 3.87 - 5.11 MIL/uL   Hemoglobin 14.5 12.0 - 15.0 g/dL   HCT 27.0 36 - 46 %   MCV 96.5 80.0 - 100.0 fL   MCH 31.7 26.0 - 34.0 pg   MCHC 32.9 30.0 - 36.0 g/dL   RDW 62.3 76.2 - 83.1 %   Platelets 169 150 - 400 K/uL   nRBC 0.0 0.0 - 0.2 %   Neutrophils Relative % 35 %   Neutro Abs 1.5 (L) 1.7 - 7.7 K/uL   Lymphocytes Relative 50 %   Lymphs Abs 2.1 0.7 - 4.0 K/uL   Monocytes Relative 12 %   Monocytes Absolute 0.5 0 - 1 K/uL  Eosinophils Relative 2 %   Eosinophils Absolute 0.1 0 - 0 K/uL   Basophils Relative 1 %   Basophils Absolute 0.0 0 - 0 K/uL   Immature Granulocytes 0 %   Abs Immature Granulocytes 0.01 0.00 - 0.07 K/uL    Comment: Performed at [facility], [place] [address], [place], [place] [address]   CT Abdomen Pelvis W Contrast  Result Date: 03/26/2020 CLINICAL DATA:  Left lower quadrant pain 2-3 weeks EXAM: CT ABDOMEN AND PELVIS WITH CONTRAST TECHNIQUE: Multidetector CT imaging of the abdomen and pelvis was performed using the standard protocol following bolus administration of intravenous contrast. CONTRAST:  132mL OMNIPAQUE IOHEXOL 300 MG/ML  SOLN COMPARISON:  March 17, 2020 FINDINGS: Lower chest: The visualized heart size within normal limits. No pericardial fluid/thickening. No hiatal hernia. The visualized portions of the lungs are clear. Hepatobiliary: The liver is normal in density without focal abnormality.The main portal vein is patent. No evidence of calcified gallstones, gallbladder wall thickening or biliary dilatation. Pancreas: Unremarkable. No pancreatic ductal dilatation or  surrounding inflammatory changes. Spleen: Normal in size without focal abnormality. Adrenals/Urinary Tract: Both adrenal glands appear normal. The kidneys and collecting system appear normal without evidence of urinary tract calculus or hydronephrosis. There is mild wall thickening seen at the superior left bladder wall with fat stranding changes likely from the diverticular stranding. Stomach/Bowel: The stomach and small bowel are normal in appearance. Again noted is a a focal 7 cm segment of sigmoid colon with diffuse wall thickening and surrounding fat stranding changes. There appears to be a heterogeneous ill-defined fluid pocket with possible loculation seen now adjacent to the posterior sigmoid colon, series 2, image 79 which could be phlegmon/early abscess. No pneumoperitoneum. Vascular/Lymphatic: There are no enlarged mesenteric, retroperitoneal, or pelvic lymph nodes. No significant vascular findings are present. Reproductive: The uterus and adnexa are unremarkable. Other: A small amount of free fluid seen within the deep pelvis. Musculoskeletal: No acute or significant osseous findings. IMPRESSION: Again noted is acute sigmoid colonic diverticulitis now with a possible tiny loculated/ill-defined fluid collection which could be early phlegmon/abscess. No definite pneumoperitoneum. Small amount of free fluid in the deep pelvis. Mild wall thickening at the superior bladder wall, likely from adjacent inflammatory changes. Electronically Signed   By: Prudencio Pair M.D.   On: 03/26/2020 22:16   Anti-infectives (From admission, onward)   Start     Dose/Rate Route Frequency Ordered Stop   03/27/20 0930  piperacillin-tazobactam (ZOSYN) IVPB 3.375 g     Discontinue     3.375 g 12.5 mL/hr over 240 Minutes Intravenous Every 8 hours 03/27/20 0844     03/26/20 2300  piperacillin-tazobactam (ZOSYN) IVPB 3.375 g        3.375 g 100 mL/hr over 30 Minutes Intravenous  Once 03/26/20 2246 03/27/20 0036       Assessment/Plan Hypertension Hypothyroidism  Sigmoid Diverticulitis with Phlegmon - Patient failed outpatient oral abx  - Agree with admission for bowel rest and IV abx  - Phlegmon on CT does not appear to be drainable at this time - Discussed with the patient the possibility of requiring a repeat CT later this week if she is not improving to see if phlegmon has developed into a drainable abscess by IR.  We also discussed the possibility if she is not improving she may require a ex lap, colectomy and likely colostomy during admission.  Ideally she will improve without needing surgery and can follow-up in the office to discuss elective colectomy  with Dr. Michaell Cowing as this is now her 3rd episode of diverticulitis.  She will need a colonoscopy in 6-8 weeks if she does improve without requiring a procedure as an inpatient. - We will continue to follow along with you  FEN - NPO, IVF VTE - SCDs, okay for chemical prophylaxis from general surgery standpoint ID - Zosyn 6/14 >> WBC 5.2 (6/14)  Jacinto Halim, [facility] 03/27/2020, 10:13 AM Please see Amion for pager number during day hours 7:00am-4:30pm

## 2020-03-27 NOTE — Progress Notes (Signed)
PROGRESS NOTE    Lori Mcknight  XFG:[id number] DOB: [birth year] DOA: 03/26/2020 PCP: Claiborne Rigg, NP   Brief Narrative:   Lori Mcknight is a 34 y.o. female with history of hypertension hypothyroidism admitted in October 2020 for acute sigmoid diverticulitis at the time patient had microperforation managed conservatively presents to the ER for the second time in 10 days with worsening abdominal pain.  Pain is mostly in the left lower quadrant.  Initially when the pain started patient had nausea vomiting and diarrhea for 3 days.  Had come to the ER on March 18, 2019 and was discharged home on oral antibiotics after CT scan showed diverticulitis.  Despite taking which patient's pain did not improve.  Patient initially improved but started to worsen again.  6/15: Continue zosyn. Add morphine for pain. Appreciate surgery assistance.    Assessment & Plan:   Principal Problem:   Acute diverticulitis Active Problems:   Essential hypertension   Hypothyroid   Diverticulitis  Acute diverticulitis     - continue zosyn, pain control, fluids  HTN     - Not on any home meds     - Pain related?     - add PRN hydralazine  Hypothyroidism     - continue synthroid  Morbid obesity     - lifestyle modification     - follow up outpatient  Hypokalemia     - replace, check Mg2+, monitor  DVT prophylaxis: SCD Code Status: FULL   Status is: Inpatient  Remains inpatient appropriate because:Inpatient level of care appropriate due to severity of illness   Dispo: The patient is from: Home              Anticipated d/c is to: Home              Anticipated d/c date is: 3 days              Patient currently is not medically stable to d/c.  Consultants:   General Surgery  Procedures:   None  Antimicrobials:  . Zosyn   ROS:  Reports LLQ ab pain. Denies CP, dyspnea, N. Remainder 10-pt ROS is negative for all not previously mentioned.  Subjective: "So it makes sense that it hurts  there?"  Objective: Vitals:   03/27/20 0521 03/27/20 0521 03/27/20 0940 03/27/20 1345  BP: (!) 169/118 (!) 169/118 (!) 154/90 (!) 153/82  Pulse: 72 65 74 77  Resp: 15 15  14   Temp: 98.6 F (37 C) 98.6 F (37 C)  98.5 F (36.9 C)  TempSrc: Oral Oral    SpO2: 98% 100% 96% 96%  Weight:      Height:        Intake/Output Summary (Last 24 hours) at 03/27/2020 1645 Last data filed at 03/27/2020 1608 Gross per 24 hour  Intake 603.45 ml  Output 1400 ml  Net -796.55 ml   Filed Weights   03/26/20 1535  Weight: (!) 147.4 kg    Examination:  General: 34 y.o. female resting in bed in NAD Cardiovascular: RRR, +S1, S2, no m/g/r Respiratory: CTABL, no w/r/r, normal WOB GI: BS+, ND, soft, TTP LLQ MSK: No e/c/c Neuro: Alert to name, follows commands Psyc: Appropriate interaction and affect, calm/cooperative   Data Reviewed: I have personally reviewed following labs and imaging studies.  CBC: Recent Labs  Lab 03/26/20 1710 03/27/20 0931  WBC 5.2 4.2  NEUTROABS  --  1.5*  HGB 14.8 14.5  HCT 45.6 44.1  MCV 95.6  96.5  PLT 189 169   Basic Metabolic Panel: Recent Labs  Lab 03/26/20 1710 03/27/20 0931  NA 137 138  K 3.7 3.4*  CL 105 106  CO2 22 22  GLUCOSE 87 97  BUN 7 7  CREATININE 0.70 0.75  CALCIUM 9.0 8.8*   GFR: Estimated Creatinine Clearance: 147.8 mL/min (by C-G formula based on SCr of 0.75 mg/dL). Liver Function Tests: Recent Labs  Lab 03/26/20 1710 03/27/20 0931  AST 23 25  ALT 21 21  ALKPHOS 63 57  BILITOT 0.9 0.7  PROT 8.0 7.4  ALBUMIN 4.0 3.7   Recent Labs  Lab 03/26/20 1710  LIPASE 26   No results for input(s): AMMONIA in the last 168 hours. Coagulation Profile: No results for input(s): INR, PROTIME in the last 168 hours. Cardiac Enzymes: No results for input(s): CKTOTAL, CKMB, CKMBINDEX, TROPONINI in the last 168 hours. BNP (last 3 results) No results for input(s): PROBNP in the last 8760 hours. HbA1C: No results for input(s): HGBA1C  in the last 72 hours. CBG: No results for input(s): GLUCAP in the last 168 hours. Lipid Profile: No results for input(s): CHOL, HDL, LDLCALC, TRIG, CHOLHDL, LDLDIRECT in the last 72 hours. Thyroid Function Tests: No results for input(s): TSH, T4TOTAL, FREET4, T3FREE, THYROIDAB in the last 72 hours. Anemia Panel: No results for input(s): VITAMINB12, FOLATE, FERRITIN, TIBC, IRON, RETICCTPCT in the last 72 hours. Sepsis Labs: No results for input(s): PROCALCITON, LATICACIDVEN in the last 168 hours.  Recent Results (from the past 240 hour(s))  SARS Coronavirus 2 by RT PCR (hospital order, performed in [facility] hospital lab) Nasopharyngeal Nasopharyngeal Swab     Status: None   Collection Time: 03/26/20 11:02 PM   Specimen: Nasopharyngeal Swab  Result Value Ref Range Status   SARS Coronavirus 2 NEGATIVE NEGATIVE Final    Comment: (NOTE) SARS-CoV-2 target nucleic acids are NOT DETECTED.  The SARS-CoV-2 RNA is generally detectable in upper and lower respiratory specimens during the acute phase of infection. The lowest concentration of SARS-CoV-2 viral copies this assay can detect is 250 copies / mL. A negative result does not preclude SARS-CoV-2 infection and should not be used as the sole basis for treatment or other patient management decisions.  A negative result may occur with improper specimen collection / handling, submission of specimen other than nasopharyngeal swab, presence of viral mutation(s) within the areas targeted by this assay, and inadequate number of viral copies (<250 copies / mL). A negative result must be combined with clinical observations, patient history, and epidemiological information.  Fact Sheet for Patients:   [employer]  Fact Sheet for Healthcare Providers: https://pope.com/  This test is not yet approved or  cleared by the [place] FDA and has been authorized for detection and/or diagnosis  of SARS-CoV-2 by FDA under an Emergency Use Authorization (EUA).  This EUA will remain in effect (meaning this test can be used) for the duration of the COVID-19 declaration under Section 564(b)(1) of the Act, 21 U.S.C. section 360bbb-3(b)(1), unless the authorization is terminated or revoked sooner.  Performed at [facility], [address] [place], [place] [address]       Radiology Studies: CT Abdomen Pelvis W Contrast  Result Date: 03/26/2020 CLINICAL DATA:  Left lower quadrant pain 2-3 weeks EXAM: CT ABDOMEN AND PELVIS WITH CONTRAST TECHNIQUE: Multidetector CT imaging of the abdomen and pelvis was performed using the standard protocol following bolus administration of intravenous contrast. CONTRAST:  OMNIPAQUE IOHEXOL 300 MG/ML  SOLN COMPARISON:  March 17, 2020 FINDINGS: Lower chest: The visualized heart size within normal limits. No pericardial fluid/thickening. No hiatal hernia. The visualized portions of the lungs are clear. Hepatobiliary: The liver is normal in density without focal abnormality.The main portal vein is patent. No evidence of calcified gallstones, gallbladder wall thickening or biliary dilatation. Pancreas: Unremarkable. No pancreatic ductal dilatation or surrounding inflammatory changes. Spleen: Normal in size without focal abnormality. Adrenals/Urinary Tract: Both adrenal glands appear normal. The kidneys and collecting system appear normal without evidence of urinary tract calculus or hydronephrosis. There is mild wall thickening seen at the superior left bladder wall with fat stranding changes likely from the diverticular stranding. Stomach/Bowel: The stomach and small bowel are normal in appearance. Again noted is a a focal 7 cm segment of sigmoid colon with diffuse wall thickening and surrounding fat stranding changes. There appears to be a heterogeneous ill-defined fluid pocket with possible loculation seen now adjacent to the posterior sigmoid  colon, series 2, image 79 which could be phlegmon/early abscess. No pneumoperitoneum. Vascular/Lymphatic: There are no enlarged mesenteric, retroperitoneal, or pelvic lymph nodes. No significant vascular findings are present. Reproductive: The uterus and adnexa are unremarkable. Other: A small amount of free fluid seen within the deep pelvis. Musculoskeletal: No acute or significant osseous findings. IMPRESSION: Again noted is acute sigmoid colonic diverticulitis now with a possible tiny loculated/ill-defined fluid collection which could be early phlegmon/abscess. No definite pneumoperitoneum. Small amount of free fluid in the deep pelvis. Mild wall thickening at the superior bladder wall, likely from adjacent inflammatory changes. Electronically Signed   By: Prudencio Pair M.D.   On: 03/26/2020 22:16     Scheduled Meds: . levothyroxine  44 mcg Intravenous Daily  . sodium chloride flush  3 mL Intravenous Once   Continuous Infusions: . dextrose 5 % and 0.9% NaCl 100 mL/hr at 03/27/20 0847  . piperacillin-tazobactam (ZOSYN)  IV 3.375 g (03/27/20 1546)     LOS: 1 day    Time spent: 25 minutes spent in the coordination of care today.    Jonnie Finner, DO [facility]  If 7PM-7AM, please contact night-coverage [email or web address] 03/27/2020, 4:45 PM

## 2020-03-27 NOTE — Plan of Care (Signed)
Plan of care reviewed and discussed with the patient. 

## 2020-03-28 LAB — CBC WITH DIFFERENTIAL/PLATELET
Abs Immature Granulocytes: 0.01 10*3/uL (ref 0.00–0.07)
Basophils Absolute: 0 10*3/uL (ref 0.0–0.1)
Basophils Relative: 1 %
Eosinophils Absolute: 0.1 10*3/uL (ref 0.0–0.5)
Eosinophils Relative: 3 %
HCT: 41.2 % (ref 36.0–46.0)
Hemoglobin: 13.5 g/dL (ref 12.0–15.0)
Immature Granulocytes: 0 %
Lymphocytes Relative: 55 %
Lymphs Abs: 2.5 10*3/uL (ref 0.7–4.0)
MCH: 31.8 pg (ref 26.0–34.0)
MCHC: 32.8 g/dL (ref 30.0–36.0)
MCV: 96.9 fL (ref 80.0–100.0)
Monocytes Absolute: 0.5 10*3/uL (ref 0.1–1.0)
Monocytes Relative: 10 %
Neutro Abs: 1.4 10*3/uL — ABNORMAL LOW (ref 1.7–7.7)
Neutrophils Relative %: 31 %
Platelets: 162 10*3/uL (ref 150–400)
RBC: 4.25 MIL/uL (ref 3.87–5.11)
RDW: 12.7 % (ref 11.5–15.5)
WBC: 4.6 10*3/uL (ref 4.0–10.5)
nRBC: 0 % (ref 0.0–0.2)

## 2020-03-28 LAB — COMPREHENSIVE METABOLIC PANEL
ALT: 25 U/L (ref 0–44)
AST: 28 U/L (ref 15–41)
Albumin: 3.5 g/dL (ref 3.5–5.0)
Alkaline Phosphatase: 50 U/L (ref 38–126)
Anion gap: 9 (ref 5–15)
BUN: 8 mg/dL (ref 6–20)
CO2: 21 mmol/L — ABNORMAL LOW (ref 22–32)
Calcium: 8.4 mg/dL — ABNORMAL LOW (ref 8.9–10.3)
Chloride: 107 mmol/L (ref 98–111)
Creatinine, Ser: 0.72 mg/dL (ref 0.44–1.00)
GFR calc Af Amer: >60 mL/min (ref >60)
GFR calc non Af Amer: >60 mL/min (ref >60)
Glucose, Bld: 95 mg/dL (ref 70–99)
Potassium: 3.3 mmol/L — ABNORMAL LOW (ref 3.5–5.1)
Sodium: 137 mmol/L (ref 135–145)
Total Bilirubin: 0.6 mg/dL (ref 0.3–1.2)
Total Protein: 6.7 g/dL (ref 6.5–8.1)

## 2020-03-28 LAB — MAGNESIUM: Magnesium: 1.8 mg/dL (ref 1.7–2.4)

## 2020-03-28 MED ORDER — HYDROCORTISONE 1 % EX CREA
TOPICAL_CREAM | Freq: Three times a day (TID) | CUTANEOUS | Status: DC | PRN
  Filled 2020-03-28 (×2): qty 28

## 2020-03-28 MED ORDER — LEVOTHYROXINE SODIUM 88 MCG PO TABS
88.0000 ug | ORAL_TABLET | Freq: Every day | ORAL | Status: DC
  Administered 2020-03-29 – 2020-04-01 (×4): 88 ug via ORAL
  Filled 2020-03-28 (×4): qty 1

## 2020-03-28 MED ORDER — LORATADINE 10 MG PO TABS
10.0000 mg | ORAL_TABLET | Freq: Every day | ORAL | Status: DC | PRN
  Administered 2020-03-28: 10 mg via ORAL
  Filled 2020-03-28: qty 1

## 2020-03-28 MED ORDER — POTASSIUM CHLORIDE 10 MEQ/100ML IV SOLN: 10.0000 meq | INTRAVENOUS | Status: DC

## 2020-03-28 MED ORDER — POTASSIUM CHLORIDE CRYS ER 20 MEQ PO TBCR
40.0000 meq | EXTENDED_RELEASE_TABLET | Freq: Once | ORAL | Status: AC
  Administered 2020-03-28: 40 meq via ORAL
  Filled 2020-03-28: qty 2

## 2020-03-28 NOTE — Progress Notes (Signed)
Lori Mcknight  RSW:[id number] DOB: [birth year] DOA: 03/26/2020 PCP: [name], NP    Brief Narrative:  34 year old with a history of HTN and hypothyroidism admitted in October 2020 with acute sigmoid diverticulitis complicated by microperforation which was able to be managed conservatively who returned to the ED 6/15 for the second visit within 10 days complaining of worsening left lower quadrant abdominal pain.  This was accompanied by 3 days of nausea vomiting and diarrhea.  The patient was initially discharged home March 17, 2020 on oral antibiotics after CT confirmed the diagnosis of diverticulitis.  Despite compliance with antibiotics her pain did not improve and she therefore returned to the ED 6/15.  Significant Events: 6/5 ED visit -diagnosis of diverticulitis -discharge oral antibiotics 6/15 return ED visit -persisting symptoms -admission  Antimicrobials:  Zosyn 6/14 >  Subjective: Resting comfortably in bed.  States she is feeling better overall.  Appetite beginning to return slowly.  Denies severe abdominal pain presently.  No nausea or vomiting.  No diarrhea at present.  Assessment & Plan:  Acute sigmoid diverticulitis Continue antibiotic therapy and bowel rest for now  HTN Blood pressure reasonably controlled -follow trend  Hypothyroidism Continue usual medical therapy  Morbid obesity - Body mass index is 52.46 kg/m.  Hypokalemia Magnesium is reasonable -likely due to GI losses and poor intake -continue to supplement potassium and follow  DVT prophylaxis: SCDs Code Status: FULL CODE Family Communication: No family present at time of exam Status is: Inpatient  Remains inpatient appropriate because:Inpatient level of care appropriate due to severity of illness   Dispo: The patient is from: Home              Anticipated d/c is to: Home              Anticipated d/c date is: 3 days              Patient currently is not medically stable to d/c.   Consultants:   General Surgery  Objective: Blood pressure (!) 146/75, pulse 64, temperature (!) 97.5 F (36.4 C), temperature source Oral, resp. rate 15, height 5\' 6"  (1.676 m), weight (!) 147.4 kg, SpO2 99 %.  Intake/Output Summary (Last 24 hours) at 03/28/2020 0708 Last data filed at 03/28/2020 0938 Gross per 24 hour  Intake 1475.47 ml  Output 1150 ml  Net 325.47 ml   Filed Weights   03/26/20 1535  Weight: (!) 147.4 kg    Examination: General: No acute respiratory distress Lungs: Clear to auscultation bilaterally without wheezes or crackles Cardiovascular: Regular rate and rhythm without murmur gallop or rub normal S1 and S2 Abdomen: Mildly tender to deep palpation with no rebound, soft, bowel sounds positive Extremities: No significant cyanosis, clubbing, or edema bilateral lower extremities  CBC: Recent Labs  Lab 03/26/20 1710 03/27/20 0931 03/28/20 0528  WBC 5.2 4.2 4.6  NEUTROABS  --  1.5* 1.4*  HGB 14.8 14.5 13.5  HCT 45.6 44.1 41.2  MCV 95.6 96.5 96.9  PLT 189 169 182   Basic Metabolic Panel: Recent Labs  Lab 03/26/20 1710 03/27/20 0931 03/28/20 0528  NA 137 138 137  K 3.7 3.4* 3.3*  CL 105 106 107  CO2 22 22 21*  GLUCOSE 87 97 95  BUN 7 7 8   CREATININE 0.70 0.75 0.72  CALCIUM 9.0 8.8* 8.4*  MG  --   --  1.8   GFR: Estimated Creatinine Clearance: 147.8 mL/min (by C-G formula based on SCr of 0.72 mg/dL).  Liver Function Tests: Recent Labs  Lab 03/26/20 1710 03/27/20 0931 03/28/20 0528  AST 23 25 28   ALT 21 21 25   ALKPHOS 63 57 50  BILITOT 0.9 0.7 0.6  PROT 8.0 7.4 6.7  ALBUMIN 4.0 3.7 3.5   Recent Labs  Lab 03/26/20 1710  LIPASE 26    HbA1C: Hemoglobin A1C  Date/Time Value Ref Range Status  06/23/2016 04:21 PM 5.5  Final   Hgb A1c MFr Bld  Date/Time Value Ref Range Status  11/12/2016 10:42 AM 5.5 4.8 - 5.6 % Final    Comment:             Pre-diabetes: 5.7 - 6.4          Diabetes: >6.4          Glycemic control for adults with diabetes:  <7.0      Recent Results (from the past 240 hour(s))  SARS Coronavirus 2 by RT PCR (hospital order, performed in [facility] hospital lab) Nasopharyngeal Nasopharyngeal Swab     Status: None   Collection Time: 03/26/20 11:02 PM   Specimen: Nasopharyngeal Swab  Result Value Ref Range Status   SARS Coronavirus 2 NEGATIVE NEGATIVE Final    Comment: (NOTE) SARS-CoV-2 target nucleic acids are NOT DETECTED.  The SARS-CoV-2 RNA is generally detectable in upper and lower respiratory specimens during the acute phase of infection. The lowest concentration of SARS-CoV-2 viral copies this assay can detect is 250 copies / mL. A negative result does not preclude SARS-CoV-2 infection and should not be used as the sole basis for treatment or other patient management decisions.  A negative result may occur with improper specimen collection / handling, submission of specimen other than nasopharyngeal swab, presence of viral mutation(s) within the areas targeted by this assay, and inadequate number of viral copies (<250 copies / mL). A negative result must be combined with clinical observations, patient history, and epidemiological information.  Fact Sheet for Patients:   [facility]  Fact Sheet for Healthcare Providers: 03/28/20  This test is not yet approved or  cleared by the [employer] FDA and has been authorized for detection and/or diagnosis of SARS-CoV-2 by FDA under an Emergency Use Authorization (EUA).  This EUA will remain in effect (meaning this test can be used) for the duration of the COVID-19 declaration under Section 564(b)(1) of the Act, 21 U.S.C. section 360bbb-3(b)(1), unless the authorization is terminated or revoked sooner.  Performed at [facility], [address], [place], [place]      Scheduled Meds: . levothyroxine  44 mcg Intravenous Daily  . sodium chloride flush  3 mL  Intravenous Once   Continuous Infusions: . dextrose 5 % and 0.9% NaCl 100 mL/hr at 03/28/20 0239  . piperacillin-tazobactam (ZOSYN)  IV 3.375 g (03/28/20 0602)     LOS: 2 days   03/30/20, MD [facility] Office  [phone] Pager - Text Page per Amion  If 7PM-7AM, please contact night-coverage per Amion 03/28/2020, 7:08 AM

## 2020-03-28 NOTE — Progress Notes (Signed)
Subjective: CC: Abdominal pain Patient notes that her pain has become less frequent. When she has episodes of pain they last less time and are now 5/10 compared to 8/10 yesterday. Some nausea yesterday that has resolved today. No emesis. She is passing flatus. Had a more formed BM yesterday.   Objective: Vital signs in last 24 hours: Temp:  [97.5 F (36.4 C)-98.5 F (36.9 C)] 97.5 F (36.4 C) (06/16 0515) Pulse Rate:  [64-77] 64 (06/16 0515) Resp:  [14-16] 16 (06/16 0758) BP: (146-155)/(75-104) 146/75 (06/16 0515) SpO2:  [96 %-99 %] 99 % (06/16 0515) Weight:  [155.6 kg] 155.6 kg (06/16 0758) Last BM Date: 03/26/20  Intake/Output from previous day: 06/15 0701 - 06/16 0700 In: 1475.5 [I.V.:1358.5; IV Piggyback:117] Out: 1150 [Urine:1150] Intake/Output this shift: No intake/output data recorded.  PE: Gen:  Alert, NAD, pleasant Pulm: Normal rate and effort  Abd: Soft, obese abdomen that does not appear to be distended. There is tenderness of the periumbilical>LLQ. Improved from yesterday. There is no rebound, rigidity or guarding. No peritonitis. +BS, no masses, hernias, or organomegaly. Prior laparoscopic incisions are well healed.  Ext:  No LE edema  Psych: A&Ox3  Skin: no rashes noted, warm and dry   Lab Results:  Recent Labs    03/27/20 0931 03/28/20 0528  WBC 4.2 4.6  HGB 14.5 13.5  HCT 44.1 41.2  PLT 169 162   BMET Recent Labs    03/27/20 0931 03/28/20 0528  NA 138 137  K 3.4* 3.3*  CL 106 107  CO2 22 21*  GLUCOSE 97 95  BUN 7 8  CREATININE 0.75 0.72  CALCIUM 8.8* 8.4*   PT/INR No results for input(s): LABPROT, INR in the last 72 hours. CMP     Component Value Date/Time   NA 137 03/28/2020 0528   NA 141 12/09/2016 1113   K 3.3 (L) 03/28/2020 0528   CL 107 03/28/2020 0528   CO2 21 (L) 03/28/2020 0528   GLUCOSE 95 03/28/2020 0528   BUN 8 03/28/2020 0528   BUN 10 12/09/2016 1113   CREATININE 0.72 03/28/2020 0528   CREATININE 0.84  10/20/2016 1205   CALCIUM 8.4 (L) 03/28/2020 0528   PROT 6.7 03/28/2020 0528   PROT 7.8 11/12/2016 1042   ALBUMIN 3.5 03/28/2020 0528   ALBUMIN 4.0 11/12/2016 1042   AST 28 03/28/2020 0528   ALT 25 03/28/2020 0528   ALKPHOS 50 03/28/2020 0528   BILITOT 0.6 03/28/2020 0528   BILITOT 0.6 11/12/2016 1042   GFRNONAA >60 03/28/2020 0528   GFRNONAA >89 10/20/2016 1205   GFRAA >60 03/28/2020 0528   GFRAA >89 10/20/2016 1205   Lipase     Component Value Date/Time   LIPASE 26 03/26/2020 1710       Studies/Results: CT Abdomen Pelvis W Contrast  Result Date: 03/26/2020 CLINICAL DATA:  Left lower quadrant pain 2-3 weeks EXAM: CT ABDOMEN AND PELVIS WITH CONTRAST TECHNIQUE: Multidetector CT imaging of the abdomen and pelvis was performed using the standard protocol following bolus administration of intravenous contrast. CONTRAST:  OMNIPAQUE IOHEXOL 300 MG/ML  SOLN COMPARISON:  March 17, 2020 FINDINGS: Lower chest: The visualized heart size within normal limits. No pericardial fluid/thickening. No hiatal hernia. The visualized portions of the lungs are clear. Hepatobiliary: The liver is normal in density without focal abnormality.The main portal vein is patent. No evidence of calcified gallstones, gallbladder wall thickening or biliary dilatation. Pancreas: Unremarkable. No pancreatic ductal dilatation or surrounding inflammatory  changes. Spleen: Normal in size without focal abnormality. Adrenals/Urinary Tract: Both adrenal glands appear normal. The kidneys and collecting system appear normal without evidence of urinary tract calculus or hydronephrosis. There is mild wall thickening seen at the superior left bladder wall with fat stranding changes likely from the diverticular stranding. Stomach/Bowel: The stomach and small bowel are normal in appearance. Again noted is a a focal 7 cm segment of sigmoid colon with diffuse wall thickening and surrounding fat stranding changes. There appears to be a  heterogeneous ill-defined fluid pocket with possible loculation seen now adjacent to the posterior sigmoid colon, series 2, image 79 which could be phlegmon/early abscess. No pneumoperitoneum. Vascular/Lymphatic: There are no enlarged mesenteric, retroperitoneal, or pelvic lymph nodes. No significant vascular findings are present. Reproductive: The uterus and adnexa are unremarkable. Other: A small amount of free fluid seen within the deep pelvis. Musculoskeletal: No acute or significant osseous findings. IMPRESSION: Again noted is acute sigmoid colonic diverticulitis now with a possible tiny loculated/ill-defined fluid collection which could be early phlegmon/abscess. No definite pneumoperitoneum. Small amount of free fluid in the deep pelvis. Mild wall thickening at the superior bladder wall, likely from adjacent inflammatory changes. Electronically Signed   By: Prudencio Pair M.D.   On: 03/26/2020 22:16    Anti-infectives: Anti-infectives (From admission, onward)   Start     Dose/Rate Route Frequency Ordered Stop   03/27/20 0930  piperacillin-tazobactam (ZOSYN) IVPB 3.375 g     Discontinue     3.375 g 12.5 mL/hr over 240 Minutes Intravenous Every 8 hours 03/27/20 0844     03/26/20 2300  piperacillin-tazobactam (ZOSYN) IVPB 3.375 g        3.375 g 100 mL/hr over 30 Minutes Intravenous  Once 03/26/20 2246 03/27/20 0036       Assessment/Plan Hypertension Hypothyroidism  Sigmoid Diverticulitis with Phlegmon - Patient failed outpatient oral abx  - Continue IV abx - Although afebrile, wbc wnl, and pain improving/less frequent, patient is still quite tender on exam. Continue bowel rest.  - Phlegmon on CT does not appear to be drainable at this time. Possibly CT later this week if not improving.  - Previously discussed with the patient the possibility of requiring a repeat CT later this week if she is not improving to see if phlegmon has developed into a drainable abscess by IR.  We also discussed  the possibility if she is not improving she may require a ex lap, colectomy and likely colostomy during admission.  Ideally she will improve without needing surgery and can follow-up in the office to discuss elective colectomy with Dr. Johney Maine as this is now her 3rd episode of diverticulitis.  She will need a colonoscopy in 6-8 weeks if she does improve without requiring a procedure as an inpatient. - We will continue to follow along with you  FEN - NPO, IVF VTE - SCDs, okay for chemical prophylaxis from general surgery standpoint ID - Zosyn 6/14 >> WBC 4.6   LOS: 2 days    Jillyn Ledger , [facility] 03/28/2020, 8:30 AM Please see Amion for pager number during day hours 7:00am-4:30pm

## 2020-03-29 ENCOUNTER — Inpatient Hospital Stay (HOSPITAL_COMMUNITY): Payer: Self-pay

## 2020-03-29 DIAGNOSIS — M79609 Pain in unspecified limb: Secondary | ICD-10-CM

## 2020-03-29 LAB — CBC
HCT: 40.7 % (ref 36.0–46.0)
Hemoglobin: 13.3 g/dL (ref 12.0–15.0)
MCH: 31.7 pg (ref 26.0–34.0)
MCHC: 32.7 g/dL (ref 30.0–36.0)
MCV: 96.9 fL (ref 80.0–100.0)
Platelets: 165 10*3/uL (ref 150–400)
RBC: 4.2 MIL/uL (ref 3.87–5.11)
RDW: 12.5 % (ref 11.5–15.5)
WBC: 4.4 10*3/uL (ref 4.0–10.5)
nRBC: 0 % (ref 0.0–0.2)

## 2020-03-29 LAB — COMPREHENSIVE METABOLIC PANEL
ALT: 26 U/L (ref 0–44)
AST: 26 U/L (ref 15–41)
Albumin: 3.6 g/dL (ref 3.5–5.0)
Alkaline Phosphatase: 50 U/L (ref 38–126)
Anion gap: 6 (ref 5–15)
BUN: <5 mg/dL — ABNORMAL LOW (ref 6–20)
CO2: 25 mmol/L (ref 22–32)
Calcium: 8.7 mg/dL — ABNORMAL LOW (ref 8.9–10.3)
Chloride: 108 mmol/L (ref 98–111)
Creatinine, Ser: 0.66 mg/dL (ref 0.44–1.00)
GFR calc Af Amer: >60 mL/min (ref >60)
GFR calc non Af Amer: >60 mL/min (ref >60)
Glucose, Bld: 88 mg/dL (ref 70–99)
Potassium: 3.5 mmol/L (ref 3.5–5.1)
Sodium: 139 mmol/L (ref 135–145)
Total Bilirubin: 0.9 mg/dL (ref 0.3–1.2)
Total Protein: 7 g/dL (ref 6.5–8.1)

## 2020-03-29 LAB — MAGNESIUM: Magnesium: 2 mg/dL (ref 1.7–2.4)

## 2020-03-29 MED ORDER — POLYETHYLENE GLYCOL 3350 17 G PO PACK
17.0000 g | PACK | Freq: Every day | ORAL | Status: DC
  Administered 2020-03-29 – 2020-03-30 (×2): 17 g via ORAL
  Filled 2020-03-29 (×3): qty 1

## 2020-03-29 MED ORDER — DOCUSATE SODIUM 100 MG PO CAPS
100.0000 mg | ORAL_CAPSULE | Freq: Two times a day (BID) | ORAL | Status: DC
  Administered 2020-03-29 – 2020-04-01 (×6): 100 mg via ORAL
  Filled 2020-03-29 (×5): qty 1

## 2020-03-29 NOTE — Progress Notes (Signed)
Lori Mcknight  GNF:[id number] DOB: [birth year] DOA: 03/26/2020 PCP: [name], NP    Brief Narrative:  34 year old with a history of HTN and hypothyroidism admitted in October 2020 with acute sigmoid diverticulitis complicated by microperforation which was able to be managed conservatively who returned to the ED 6/15 for the second visit within 10 days complaining of worsening left lower quadrant abdominal pain.  This was accompanied by 3 days of nausea vomiting and diarrhea.  The patient was initially discharged home March 17, 2020 on oral antibiotics after CT confirmed the diagnosis of diverticulitis.  Despite compliance with antibiotics her pain did not improve and she therefore returned to the ED 6/15.  Significant Events: 6/5 ED visit -diagnosis of diverticulitis -discharge oral antibiotics 6/15 return ED visit -persisting symptoms -admission  Antimicrobials:  Zosyn 6/14 >  Subjective: Diet advanced by Gen Surgery yesterday. Tolerating clear liquids well, but continues ot have abdom discomfort. Denies cp, sob, n/v. Has developed some R LE calf cramping today.   Assessment & Plan:  Acute sigmoid diverticulitis Continue antibiotic therapy - cont clear liquids but not advance given ongoing abdom pain - may require f/u CT 6/18 if sx persist   R LE calf pain R/o DVT w/ venous duplex   HTN No change in tx plan today   Hypothyroidism Continue usual medical therapy  Morbid obesity - Body mass index is 55.37 kg/m.  Hypokalemia Magnesium is reasonable -likely due to GI losses and poor intake -continue to supplement potassium to goal of 4.0  DVT prophylaxis: SCDs Code Status: FULL CODE Family Communication: No family present at time of exam Status is: Inpatient  Remains inpatient appropriate because:Inpatient level of care appropriate due to severity of illness   Dispo: The patient is from: Home              Anticipated d/c is to: Home              Anticipated d/c date is:  3 days              Patient currently is not medically stable to d/c.   Consultants:  General Surgery  Objective: Blood pressure (!) 132/91, pulse 69, temperature 97.7 F (36.5 C), temperature source Oral, resp. rate 18, height 5\' 6"  (1.676 m), weight (!) 155.6 kg, SpO2 100 %.  Intake/Output Summary (Last 24 hours) at 03/29/2020 0720 Last data filed at 03/29/2020 0215 Gross per 24 hour  Intake 240 ml  Output 1900 ml  Net -1660 ml   Filed Weights   03/26/20 1535 03/28/20 0758  Weight: (!) 147.4 kg (!) 155.6 kg    Examination: General: No acute respiratory distress Lungs: CTA B - no wheezing  Cardiovascular: RRR - no M  Abdomen: Mildly tender to deep palpation with no rebound, soft, bowel sounds + Extremities: No signif C/C/E B LE - R calf unremarkable on exam   CBC: Recent Labs  Lab 03/27/20 0931 03/28/20 0528 03/29/20 0456  WBC 4.2 4.6 4.4  NEUTROABS 1.5* 1.4*  --   HGB 14.5 13.5 13.3  HCT 44.1 41.2 40.7  MCV 96.5 96.9 96.9  PLT 169 162 846   Basic Metabolic Panel: Recent Labs  Lab 03/27/20 0931 03/28/20 0528 03/29/20 0456  NA 138 137 139  K 3.4* 3.3* 3.5  CL 106 107 108  CO2 22 21* 25  GLUCOSE 97 95 88  BUN 7 8 <5*  CREATININE 0.75 0.72 0.66  CALCIUM 8.8* 8.4* 8.7*  MG  --  1.8 2.0   GFR: Estimated Creatinine Clearance: 153 mL/min (by C-G formula based on SCr of 0.66 mg/dL).  Liver Function Tests: Recent Labs  Lab 03/26/20 1710 03/27/20 0931 03/28/20 0528 03/29/20 0456  AST 23 25 28 26   ALT 21 21 25 26   ALKPHOS 63 57 50 50  BILITOT 0.9 0.7 0.6 0.9  PROT 8.0 7.4 6.7 7.0  ALBUMIN 4.0 3.7 3.5 3.6   Recent Labs  Lab 03/26/20 1710  LIPASE 26    HbA1C: Hemoglobin A1C  Date/Time Value Ref Range Status  06/23/2016 04:21 PM 5.5  Final   Hgb A1c MFr Bld  Date/Time Value Ref Range Status  11/12/2016 10:42 AM 5.5 4.8 - 5.6 % Final    Comment:             Pre-diabetes: 5.7 - 6.4          Diabetes: >6.4          Glycemic control for adults  with diabetes: <7.0      Recent Results (from the past 240 hour(s))  SARS Coronavirus 2 by RT PCR (hospital order, performed in [facility] hospital lab) Nasopharyngeal Nasopharyngeal Swab     Status: None   Collection Time: 03/26/20 11:02 PM   Specimen: Nasopharyngeal Swab  Result Value Ref Range Status   SARS Coronavirus 2 NEGATIVE NEGATIVE Final    Comment: (NOTE) SARS-CoV-2 target nucleic acids are NOT DETECTED.  The SARS-CoV-2 RNA is generally detectable in upper and lower respiratory specimens during the acute phase of infection. The lowest concentration of SARS-CoV-2 viral copies this assay can detect is 250 copies / mL. A negative result does not preclude SARS-CoV-2 infection and should not be used as the sole basis for treatment or other patient management decisions.  A negative result may occur with improper specimen collection / handling, submission of specimen other than nasopharyngeal swab, presence of viral mutation(s) within the areas targeted by this assay, and inadequate number of viral copies (<250 copies / mL). A negative result must be combined with clinical observations, patient history, and epidemiological information.  Fact Sheet for Patients:   [facility]  Fact Sheet for Healthcare Providers: 03/28/20  This test is not yet approved or  cleared by the [employer] FDA and has been authorized for detection and/or diagnosis of SARS-CoV-2 by FDA under an Emergency Use Authorization (EUA).  This EUA will remain in effect (meaning this test can be used) for the duration of the COVID-19 declaration under Section 564(b)(1) of the Act, 21 U.S.C. section 360bbb-3(b)(1), unless the authorization is terminated or revoked sooner.  Performed at [facility], [address], [place], [place]      Scheduled Meds: . levothyroxine  88 mcg Oral Q0600  . sodium chloride  flush  3 mL Intravenous Once   Continuous Infusions: . piperacillin-tazobactam (ZOSYN)  IV 3.375 g (03/29/20 0530)     LOS: 3 days   [id number], MD [facility] Office  [phone] Pager - Text Page per Amion  If 7PM-7AM, please contact night-coverage per Amion 03/29/2020, 7:20 AM

## 2020-03-29 NOTE — Progress Notes (Signed)
[facility] Progress Note     Subjective: Patient reports some sharp cramping pains especially with passing flatus and trying to have BM. Does not notice associated between PO intake and pain. Some nausea this AM.   Objective: Vital signs in last 24 hours: Temp:  [97.7 F (36.5 C)-98.5 F (36.9 C)] 97.7 F (36.5 C) (06/17 0528) Pulse Rate:  [61-69] 69 (06/17 0528) Resp:  [18-20] 18 (06/17 0528) BP: (132-141)/(74-91) 132/91 (06/17 0528) SpO2:  [100 %] 100 % (06/17 0528) Last BM Date: 03/27/20  Intake/Output from previous day: 06/16 0701 - 06/17 0700 In: 240 [P.O.:240] Out: 1900 [Urine:1900] Intake/Output this shift: No intake/output data recorded.  PE: Gen:  Alert, NAD, pleasant Pulm: Normal rate and effort  MAU:QJFH,LKTGY abdomen that does not appear to be distended. There is tenderness of suprapubic/LLQ. There is no rebound, rigidity or guarding. No peritonitis.No masses, hernias, or organomegaly. Prior laparoscopic incisions are well healed. Ext:  No LE edema  Psych: A&Ox3  Skin: no rashes noted, warm and dry   Lab Results:  Recent Labs    03/28/20 0528 03/29/20 0456  WBC 4.6 4.4  HGB 13.5 13.3  HCT 41.2 40.7  PLT 162 165   BMET Recent Labs    03/28/20 0528 03/29/20 0456  NA 137 139  K 3.3* 3.5  CL 107 108  CO2 21* 25  GLUCOSE 95 88  BUN 8 <5*  CREATININE 0.72 0.66  CALCIUM 8.4* 8.7*   PT/INR No results for input(s): LABPROT, INR in the last 72 hours. CMP     Component Value Date/Time   NA 139 03/29/2020 0456   NA 141 12/09/2016 1113   K 3.5 03/29/2020 0456   CL 108 03/29/2020 0456   CO2 25 03/29/2020 0456   GLUCOSE 88 03/29/2020 0456   BUN <5 (L) 03/29/2020 0456   BUN 10 12/09/2016 1113   CREATININE 0.66 03/29/2020 0456   CREATININE 0.84 10/20/2016 1205   CALCIUM 8.7 (L) 03/29/2020 0456   PROT 7.0 03/29/2020 0456   PROT 7.8 11/12/2016 1042   ALBUMIN 3.6 03/29/2020 0456   ALBUMIN 4.0 11/12/2016 1042   AST 26 03/29/2020  0456   ALT 26 03/29/2020 0456   ALKPHOS 50 03/29/2020 0456   BILITOT 0.9 03/29/2020 0456   BILITOT 0.6 11/12/2016 1042   GFRNONAA >60 03/29/2020 0456   GFRNONAA >89 10/20/2016 1205   GFRAA >60 03/29/2020 0456   GFRAA >89 10/20/2016 1205   Lipase     Component Value Date/Time   LIPASE 26 03/26/2020 1710       Studies/Results: No results found.  Anti-infectives: Anti-infectives (From admission, onward)   Start     Dose/Rate Route Frequency Ordered Stop   03/27/20 0930  piperacillin-tazobactam (ZOSYN) IVPB 3.375 g     Discontinue     3.375 g 12.5 mL/hr over 240 Minutes Intravenous Every 8 hours 03/27/20 0844     03/26/20 2300  piperacillin-tazobactam (ZOSYN) IVPB 3.375 g        3.375 g 100 mL/hr over 30 Minutes Intravenous  Once 03/26/20 2246 03/27/20 0036       Assessment/Plan Hypertension Hypothyroidism Morbid obesity - BMI 55.37  Sigmoid Diverticulitis with Phlegmon - Patient failed outpatient oral abx  - Continue IV abx - Although afebrile, wbc wnl, and pain improving/less frequent, patient is still ttp in LLQ/suprapubic abdomen and nauseated this AM - would continue CLD for now, if less nauseated later today could consider FLD - Phlegmon on CT does not appear  to be drainable at this time. Possibly CT later this week if not improving.  -Previously discussed with the patient the possibility of requiring a repeat CT later this week if she is not improving to see if phlegmon has developed into a drainable abscess by IR. We also discussed the possibility if she is not improving she may require a ex lap, colectomy and likely colostomy during admission. Ideally she will improve without needing surgery and can follow-up in the office to discuss elective colectomy with Dr. Johney Maine as this is now her 3rd episode of diverticulitis. She will need a colonoscopy in 6-8 weeks if she does improve without requiring a procedure as aninpatient. - We will continue to follow along with  you  FEN -CLD VTE -SCDs, okay forchemical prophylaxis from general surgery standpoint ID -Zosyn 6/14 >> WBC 4.6  LOS: 3 days    Norm Parcel , [facility] 03/29/2020, 9:41 AM Please see Amion for pager number during day hours 7:00am-4:30pm

## 2020-03-29 NOTE — Progress Notes (Signed)
VASCULAR LAB    Bilateral lower extremity venous duplex completed.    Preliminary report:  See CV proc for preliminary results.  Kynsley Whitehouse, RVT 03/29/2020, 4:10 PM

## 2020-03-30 LAB — BASIC METABOLIC PANEL
Anion gap: 7 (ref 5–15)
BUN: <5 mg/dL — ABNORMAL LOW (ref 6–20)
CO2: 24 mmol/L (ref 22–32)
Calcium: 8.6 mg/dL — ABNORMAL LOW (ref 8.9–10.3)
Chloride: 105 mmol/L (ref 98–111)
Creatinine, Ser: 0.8 mg/dL (ref 0.44–1.00)
GFR calc Af Amer: >60 mL/min (ref >60)
GFR calc non Af Amer: >60 mL/min (ref >60)
Glucose, Bld: 87 mg/dL (ref 70–99)
Potassium: 3.4 mmol/L — ABNORMAL LOW (ref 3.5–5.1)
Sodium: 136 mmol/L (ref 135–145)

## 2020-03-30 LAB — CBC
HCT: 43.2 % (ref 36.0–46.0)
Hemoglobin: 13.9 g/dL (ref 12.0–15.0)
MCH: 31.3 pg (ref 26.0–34.0)
MCHC: 32.2 g/dL (ref 30.0–36.0)
MCV: 97.3 fL (ref 80.0–100.0)
Platelets: 169 10*3/uL (ref 150–400)
RBC: 4.44 MIL/uL (ref 3.87–5.11)
RDW: 12.3 % (ref 11.5–15.5)
WBC: 4.1 10*3/uL (ref 4.0–10.5)
nRBC: 0 % (ref 0.0–0.2)

## 2020-03-30 MED ORDER — ENOXAPARIN SODIUM 40 MG/0.4ML ~~LOC~~ SOLN
40.0000 mg | SUBCUTANEOUS | Status: DC
  Administered 2020-03-30: 40 mg via SUBCUTANEOUS
  Filled 2020-03-30: qty 0.4

## 2020-03-30 MED ORDER — POTASSIUM CHLORIDE CRYS ER 20 MEQ PO TBCR
40.0000 meq | EXTENDED_RELEASE_TABLET | Freq: Two times a day (BID) | ORAL | Status: AC
  Administered 2020-03-30 – 2020-03-31 (×3): 40 meq via ORAL
  Filled 2020-03-30 (×3): qty 2

## 2020-03-30 NOTE — Progress Notes (Signed)
[facility] Progress Note     Subjective: Patient reports abdominal pain is improved from yesterday. She is passing flatus, no BM, but having less pain with flatus. She reports nausea is improved. R calf pain overnight, [place] was done.   Objective: Vital signs in last 24 hours: Temp:  [97.4 F (36.3 C)-98 F (36.7 C)] 97.7 F (36.5 C) (06/18 0511) Pulse Rate:  [56-60] 57 (06/18 0511) Resp:  [18] 18 (06/18 0511) BP: (101-128)/(62-82) 101/62 (06/18 0511) SpO2:  [97 %-100 %] 100 % (06/18 0511) Last BM Date: 03/27/20  Intake/Output from previous day: 06/17 0701 - 06/18 0700 In: 600 [P.O.:600] Out: 3150 [Urine:3150] Intake/Output this shift: No intake/output data recorded.  PE: Gen: Alert, NAD, pleasant Pulm: Normal rate and effort ZJQ:BHAL,PFXTK abdomen that does not appear to be distended. There is minimal tenderness of suprapubic/LLQ.There is no rebound, rigidity or guarding. No peritonitis.No masses, hernias, or organomegaly. Prior laparoscopic incisions are well healed. Ext: No LE edema Psych: A&Ox3  Skin: no rashes noted, warm and dry   Lab Results:  Recent Labs    03/29/20 0456 03/30/20 0507  WBC 4.4 4.1  HGB 13.3 13.9  HCT 40.7 43.2  PLT 165 169   BMET Recent Labs    03/29/20 0456 03/30/20 0507  NA 139 136  K 3.5 3.4*  CL 108 105  CO2 25 24  GLUCOSE 88 87  BUN <5* <5*  CREATININE 0.66 0.80  CALCIUM 8.7* 8.6*   PT/INR No results for input(s): LABPROT, INR in the last 72 hours. CMP     Component Value Date/Time   NA 136 03/30/2020 0507   NA 141 12/09/2016 1113   K 3.4 (L) 03/30/2020 0507   CL 105 03/30/2020 0507   CO2 24 03/30/2020 0507   GLUCOSE 87 03/30/2020 0507   BUN <5 (L) 03/30/2020 0507   BUN 10 12/09/2016 1113   CREATININE 0.80 03/30/2020 0507   CREATININE 0.84 10/20/2016 1205   CALCIUM 8.6 (L) 03/30/2020 0507   PROT 7.0 03/29/2020 0456   PROT 7.8 11/12/2016 1042   ALBUMIN 3.6 03/29/2020 0456   ALBUMIN 4.0  11/12/2016 1042   AST 26 03/29/2020 0456   ALT 26 03/29/2020 0456   ALKPHOS 50 03/29/2020 0456   BILITOT 0.9 03/29/2020 0456   BILITOT 0.6 11/12/2016 1042   GFRNONAA >60 03/30/2020 0507   GFRNONAA >89 10/20/2016 1205   GFRAA >60 03/30/2020 0507   GFRAA >89 10/20/2016 1205   Lipase     Component Value Date/Time   LIPASE 26 03/26/2020 1710       Studies/Results: VAS [place] LOWER EXTREMITY VENOUS (DVT)  Result Date: 03/29/2020  Lower Venous DVTStudy Indications: Pain, and decreased mobility.  Limitations: Body habitus. Comparison Study: No prior study on file Performing Technologist: Sherren Kerns RVS  Examination Guidelines: A complete evaluation includes B-mode imaging, spectral Doppler, color Doppler, and power Doppler as needed of all accessible portions of each vessel. Bilateral testing is considered an integral part of a complete examination. Limited examinations for reoccurring indications may be performed as noted. The reflux portion of the exam is performed with the patient in reverse Trendelenburg.  +---------+---------------+---------+-----------+----------+--------------+ RIGHT    CompressibilityPhasicitySpontaneityPropertiesThrombus Aging +---------+---------------+---------+-----------+----------+--------------+ CFV      Full           Yes      Yes                                 +---------+---------------+---------+-----------+----------+--------------+  SFJ      Full                                                        +---------+---------------+---------+-----------+----------+--------------+ FV Prox  Full                                                        +---------+---------------+---------+-----------+----------+--------------+ FV Mid   Full                                                        +---------+---------------+---------+-----------+----------+--------------+ FV DistalFull                                                         +---------+---------------+---------+-----------+----------+--------------+ PFV      Full                                                        +---------+---------------+---------+-----------+----------+--------------+ POP      Full           Yes      Yes                                 +---------+---------------+---------+-----------+----------+--------------+ PTV      Full                                                        +---------+---------------+---------+-----------+----------+--------------+ PERO     Full                                                        +---------+---------------+---------+-----------+----------+--------------+   +---------+---------------+---------+-----------+----------+--------------+ LEFT     CompressibilityPhasicitySpontaneityPropertiesThrombus Aging +---------+---------------+---------+-----------+----------+--------------+ CFV      Full           Yes      Yes                                 +---------+---------------+---------+-----------+----------+--------------+ SFJ      Full                                                        +---------+---------------+---------+-----------+----------+--------------+  FV Prox  Full                                                        +---------+---------------+---------+-----------+----------+--------------+ FV Mid   Full                                                        +---------+---------------+---------+-----------+----------+--------------+ FV DistalFull                                                        +---------+---------------+---------+-----------+----------+--------------+ PFV      Full                                                        +---------+---------------+---------+-----------+----------+--------------+ POP      Full           Yes      Yes                                  +---------+---------------+---------+-----------+----------+--------------+ PTV      Full                                                        +---------+---------------+---------+-----------+----------+--------------+ PERO     Full                                                        +---------+---------------+---------+-----------+----------+--------------+     Summary: BILATERAL: - No evidence of deep vein thrombosis seen in the lower extremities, bilaterally. -   *See table(s) above for measurements and observations.    Preliminary     Anti-infectives: Anti-infectives (From admission, onward)   Start     Dose/Rate Route Frequency Ordered Stop   03/27/20 0930  piperacillin-tazobactam (ZOSYN) IVPB 3.375 g     Discontinue     3.375 g 12.5 mL/hr over 240 Minutes Intravenous Every 8 hours 03/27/20 0844     03/26/20 2300  piperacillin-tazobactam (ZOSYN) IVPB 3.375 g        3.375 g 100 mL/hr over 30 Minutes Intravenous  Once 03/26/20 2246 03/27/20 0036       Assessment/Plan Hypertension Hypothyroidism Morbid obesity - BMI 55.37 R calf pain - not red/hot/swollen, [place] yesterday without DVT  Sigmoid Diverticulitis with Phlegmon - Patient failed outpatient oral abx  -Continue IV abx - pain improved significantly this AM - advance to FLD - Phlegmon on CT does not appear  to be drainable at this time. Possibly CT later this week if not improving. - if she continues to improve without surgery, may be able to advance to soft diet and PO abx tomorrow - could be ready to go home tomorrow or Sunday -follow-up in the office to discuss elective colectomy with Dr. Michaell Cowing as this is now her 3rd episode of diverticulitis. She will need a colonoscopy in 6-8 weeks if she does improve without requiring a procedure as aninpatient. - We will continue to follow along with you  FEN -FLD VTE -SCDs, okay forchemical prophylaxis from general surgery standpoint ID -Zosyn 6/14 >>  WBC4.1  LOS: 4 days    Juliet Rude , [facility] 03/30/2020, 7:44 AM Please see Amion for pager number during day hours 7:00am-4:30pm

## 2020-03-30 NOTE — Progress Notes (Signed)
Lori Mcknight  TIW:[id number] DOB: 01-Dec-1985 DOA: 03/26/2020 PCP: [name], NP    Brief Narrative:  34 year old with a history of HTN and hypothyroidism admitted in October 2020 with acute sigmoid diverticulitis complicated by microperforation which was able to be managed conservatively who returned to the ED 6/15 for the second visit within 10 days complaining of worsening left lower quadrant abdominal pain.  This was accompanied by 3 days of nausea vomiting and diarrhea.  The patient was initially discharged home March 17, 2020 on oral antibiotics after CT confirmed the diagnosis of diverticulitis.  Despite compliance with antibiotics her pain did not improve and she therefore returned to the ED 6/15.  Significant Events: 6/5 ED visit -diagnosis of diverticulitis -discharge oral antibiotics 6/15 return ED visit -persisting symptoms -admission 6/17 bilateral lower extremity venous duplex negative  Antimicrobials:  Zosyn 6/14 >  Subjective: Resting comfortably in bed at the time of my visit.  States the cramping in her right calf continues.  Denies chest pain or shortness of breath.  Reports improvement in abdominal discomfort and a returning appetite.  Has tolerated clear liquids without difficulty thus far.  Assessment & Plan:  Acute sigmoid diverticulitis Continue IV antibiotic therapy -general surgery to advance diet today -patient appears to be making progress at this time  R LE calf pain No evidence of DVT on bilateral lower extremity venous duplex 6/17 -suspect muscular cramping related to bed rest -mobilize  HTN Well-controlled at this time  Hypothyroidism Continue usual medical therapy  Morbid obesity - Body mass index is 55.37 kg/m.  Hypokalemia Magnesium is reasonable - due to GI losses and poor intake -continue to supplement potassium to goal of 4.0  DVT prophylaxis: Lovenox Code Status: FULL CODE Family Communication: No family present at time of  exam Status is: Inpatient  Remains inpatient appropriate because:Inpatient level of care appropriate due to severity of illness   Dispo: The patient is from: Home              Anticipated d/c is to: Home              Anticipated d/c date is: 3 days              Patient currently is not medically stable to d/c.   Consultants:  General Surgery  Objective: Blood pressure 101/62, pulse (!) 57, temperature 97.7 F (36.5 C), temperature source Oral, resp. rate 18, height 5\' 6"  (1.676 m), weight (!) 155.6 kg, SpO2 100 %.  Intake/Output Summary (Last 24 hours) at 03/30/2020 0743 Last data filed at 03/30/2020 0559 Gross per 24 hour  Intake 600 ml  Output 3150 ml  Net -2550 ml   Filed Weights   03/26/20 1535 03/28/20 0758  Weight: (!) 147.4 kg (!) 155.6 kg    Examination: General: NAD Lungs: CTA B without wheezing or crackles Cardiovascular: RRR  Abdomen: Mildly tender to deep palpation with no rebound, soft, BS + Extremities: No signif C/C/E B LE - R calf remains unremarkable on exam   CBC: Recent Labs  Lab 03/27/20 0931 03/27/20 0931 03/28/20 0528 03/29/20 0456 03/30/20 0507  WBC 4.2   < > 4.6 4.4 4.1  NEUTROABS 1.5*  --  1.4*  --   --   HGB 14.5   < > 13.5 13.3 13.9  HCT 44.1   < > 41.2 40.7 43.2  MCV 96.5   < > 96.9 96.9 97.3  PLT 169   < > 162 165 169   < > =  values in this interval not displayed.   Basic Metabolic Panel: Recent Labs  Lab 03/28/20 0528 03/29/20 0456 03/30/20 0507  NA 137 139 136  K 3.3* 3.5 3.4*  CL 107 108 105  CO2 21* 25 24  GLUCOSE 95 88 87  BUN 8 <5* <5*  CREATININE 0.72 0.66 0.80  CALCIUM 8.4* 8.7* 8.6*  MG 1.8 2.0  --    GFR: Estimated Creatinine Clearance: 153 mL/min (by C-G formula based on SCr of 0.8 mg/dL).  Liver Function Tests: Recent Labs  Lab 03/26/20 1710 03/27/20 0931 03/28/20 0528 03/29/20 0456  AST 23 25 28 26   ALT 21 21 25 26   ALKPHOS 63 57 50 50  BILITOT 0.9 0.7 0.6 0.9  PROT 8.0 7.4 6.7 7.0  ALBUMIN  4.0 3.7 3.5 3.6   Recent Labs  Lab 03/26/20 1710  LIPASE 26    HbA1C: Hemoglobin A1C  Date/Time Value Ref Range Status  06/23/2016 04:21 PM 5.5  Final   Hgb A1c MFr Bld  Date/Time Value Ref Range Status  11/12/2016 10:42 AM 5.5 4.8 - 5.6 % Final    Comment:             Pre-diabetes: 5.7 - 6.4          Diabetes: >6.4          Glycemic control for adults with diabetes: <7.0      Recent Results (from the past 240 hour(s))  SARS Coronavirus 2 by RT PCR (hospital order, performed in [facility] hospital lab) Nasopharyngeal Nasopharyngeal Swab     Status: None   Collection Time: 03/26/20 11:02 PM   Specimen: Nasopharyngeal Swab  Result Value Ref Range Status   SARS Coronavirus 2 NEGATIVE NEGATIVE Final    Comment: (NOTE) SARS-CoV-2 target nucleic acids are NOT DETECTED.  The SARS-CoV-2 RNA is generally detectable in upper and lower respiratory specimens during the acute phase of infection. The lowest concentration of SARS-CoV-2 viral copies this assay can detect is 250 copies / mL. A negative result does not preclude SARS-CoV-2 infection and should not be used as the sole basis for treatment or other patient management decisions.  A negative result may occur with improper specimen collection / handling, submission of specimen other than nasopharyngeal swab, presence of viral mutation(s) within the areas targeted by this assay, and inadequate number of viral copies (<250 copies / mL). A negative result must be combined with clinical observations, patient history, and epidemiological information.  Fact Sheet for Patients:   [facility]  Fact Sheet for Healthcare Providers: 03/28/20  This test is not yet approved or  cleared by the [employer] FDA and has been authorized for detection and/or diagnosis of SARS-CoV-2 by FDA under an Emergency Use Authorization (EUA).  This EUA will remain in effect (meaning  this test can be used) for the duration of the COVID-19 declaration under Section 564(b)(1) of the Act, 21 U.S.C. section 360bbb-3(b)(1), unless the authorization is terminated or revoked sooner.  Performed at [facility], [address], [place], [place]      Scheduled Meds: . docusate sodium  100 mg Oral BID  . levothyroxine  88 mcg Oral Q0600  . polyethylene glycol  17 g Oral Daily  . sodium chloride flush  3 mL Intravenous Once   Continuous Infusions: . piperacillin-tazobactam (ZOSYN)  IV 3.375 g (03/30/20 0529)     LOS: 4 days   [id number], MD [facility] Office  [phone] Pager -  Text Page per Loretha Stapler  If 7PM-7AM, please contact night-coverage per Amion 03/30/2020, 7:43 AM

## 2020-03-31 LAB — BASIC METABOLIC PANEL
Anion gap: 4 — ABNORMAL LOW (ref 5–15)
BUN: <5 mg/dL — ABNORMAL LOW (ref 6–20)
CO2: 26 mmol/L (ref 22–32)
Calcium: 8.7 mg/dL — ABNORMAL LOW (ref 8.9–10.3)
Chloride: 106 mmol/L (ref 98–111)
Creatinine, Ser: 0.97 mg/dL (ref 0.44–1.00)
GFR calc Af Amer: >60 mL/min (ref >60)
GFR calc non Af Amer: >60 mL/min (ref >60)
Glucose, Bld: 95 mg/dL (ref 70–99)
Potassium: 4.7 mmol/L (ref 3.5–5.1)
Sodium: 136 mmol/L (ref 135–145)

## 2020-03-31 LAB — MAGNESIUM: Magnesium: 1.9 mg/dL (ref 1.7–2.4)

## 2020-03-31 MED ORDER — ENOXAPARIN SODIUM 80 MG/0.8ML ~~LOC~~ SOLN
80.0000 mg | SUBCUTANEOUS | Status: DC
  Administered 2020-03-31 – 2020-04-01 (×2): 80 mg via SUBCUTANEOUS
  Filled 2020-03-31 (×2): qty 0.8

## 2020-03-31 NOTE — Progress Notes (Signed)
Lori Mcknight  WUJ:[id number] DOB: [birth year] DOA: 03/26/2020 PCP: Claiborne Rigg, NP    Brief Narrative:  34 year old with a history of HTN and hypothyroidism admitted in October 2020 with acute sigmoid diverticulitis complicated by microperforation which was able to be managed conservatively who returned to the ED 6/15 for the second visit within 10 days complaining of worsening left lower quadrant abdominal pain.  This was accompanied by 3 days of nausea vomiting and diarrhea.  The patient was initially discharged home March 17, 2020 on oral antibiotics after CT confirmed the diagnosis of diverticulitis.  Despite compliance with antibiotics her pain did not improve and she therefore returned to the ED 6/15.  Significant Events: 6/5 ED visit -diagnosis of diverticulitis -discharge oral antibiotics 6/15 return ED visit -persisting symptoms -admission 6/17 bilateral lower extremity venous duplex negative  Antimicrobials:  Zosyn 6/14 >  Subjective: Some mild ongoing abdom pain, but this is improved since admit. Tolerating advancing of diet w/o trouble. Denies cp, n/v, or sob.   Assessment & Plan:  Acute sigmoid diverticulitis Continue IV antibiotic therapy - general surgery to advance diet today - patient appears to be making progress at this time - anticipate transition to oral abx and d/c home tomorrow if continues to progress at this rate   R LE calf pain No evidence of DVT on bilateral lower extremity venous duplex 6/17 -suspect muscular cramping related to bed rest -mobilize  HTN Well-controlled at this time  Hypothyroidism Continue usual medical therapy  Morbid obesity - Body mass index is 55.37 kg/m.  Hypokalemia due to GI losses and poor intake - K+ now at goal   DVT prophylaxis: Lovenox Code Status: FULL CODE Family Communication: No family present at time of exam Status is: Inpatient  Remains inpatient appropriate because:Inpatient level of care appropriate due to  severity of illness   Dispo: The patient is from: Home              Anticipated d/c is to: Home              Anticipated d/c date is: 6/20              Patient currently is not medically stable to d/c.   Consultants:  General Surgery  Objective: Blood pressure (!) 142/77, pulse 61, temperature 97.8 F (36.6 C), temperature source Oral, resp. rate 18, height 5\' 6"  (1.676 m), weight (!) 155.6 kg, SpO2 100 %.  Intake/Output Summary (Last 24 hours) at 03/31/2020 0722 Last data filed at 03/31/2020 0558 Gross per 24 hour  Intake 1270.33 ml  Output 1400 ml  Net -129.67 ml   Filed Weights   03/26/20 1535 03/28/20 0758  Weight: (!) 147.4 kg (!) 155.6 kg    Examination: General: NAD Lungs: CTA B  Cardiovascular: RRR  Abdomen: Mildly tender to deep palpation with no rebound, soft, BS + Extremities: No signif edema B LE   CBC: Recent Labs  Lab 03/27/20 0931 03/27/20 0931 03/28/20 0528 03/29/20 0456 03/30/20 0507  WBC 4.2   < > 4.6 4.4 4.1  NEUTROABS 1.5*  --  1.4*  --   --   HGB 14.5   < > 13.5 13.3 13.9  HCT 44.1   < > 41.2 40.7 43.2  MCV 96.5   < > 96.9 96.9 97.3  PLT 169   < > 162 165 169   < > = values in this interval not displayed.   Basic Metabolic Panel: Recent Labs  Lab  03/28/20 0528 03/28/20 0528 03/29/20 0456 03/30/20 0507 03/31/20 0543  NA 137   < > 139 136 136  K 3.3*   < > 3.5 3.4* 4.7  CL 107   < > 108 105 106  CO2 21*   < > 25 24 26   GLUCOSE 95   < > 88 87 95  BUN 8   < > <5* <5* <5*  CREATININE 0.72   < > 0.66 0.80 0.97  CALCIUM 8.4*   < > 8.7* 8.6* 8.7*  MG 1.8  --  2.0  --  1.9   < > = values in this interval not displayed.   GFR: Estimated Creatinine Clearance: 126.2 mL/min (by C-G formula based on SCr of 0.97 mg/dL).  Liver Function Tests: Recent Labs  Lab 03/26/20 1710 03/27/20 0931 03/28/20 0528 03/29/20 0456  AST 23 25 28 26   ALT 21 21 25 26   ALKPHOS 63 57 50 50  BILITOT 0.9 0.7 0.6 0.9  PROT 8.0 7.4 6.7 7.0  ALBUMIN 4.0  3.7 3.5 3.6   Recent Labs  Lab 03/26/20 1710  LIPASE 26    HbA1C: Hemoglobin A1C  Date/Time Value Ref Range Status  06/23/2016 04:21 PM 5.5  Final   Hgb A1c MFr Bld  Date/Time Value Ref Range Status  11/12/2016 10:42 AM 5.5 4.8 - 5.6 % Final    Comment:             Pre-diabetes: 5.7 - 6.4          Diabetes: >6.4          Glycemic control for adults with diabetes: <7.0      Recent Results (from the past 240 hour(s))  SARS Coronavirus 2 by RT PCR (hospital order, performed in [facility] hospital lab) Nasopharyngeal Nasopharyngeal Swab     Status: None   Collection Time: 03/26/20 11:02 PM   Specimen: Nasopharyngeal Swab  Result Value Ref Range Status   SARS Coronavirus 2 NEGATIVE NEGATIVE Final    Comment: (NOTE) SARS-CoV-2 target nucleic acids are NOT DETECTED.  The SARS-CoV-2 RNA is generally detectable in upper and lower respiratory specimens during the acute phase of infection. The lowest concentration of SARS-CoV-2 viral copies this assay can detect is 250 copies / mL. A negative result does not preclude SARS-CoV-2 infection and should not be used as the sole basis for treatment or other patient management decisions.  A negative result may occur with improper specimen collection / handling, submission of specimen other than nasopharyngeal swab, presence of viral mutation(s) within the areas targeted by this assay, and inadequate number of viral copies (<250 copies / mL). A negative result must be combined with clinical observations, patient history, and epidemiological information.  Fact Sheet for Patients:   11/14/2016  Fact Sheet for Healthcare Providers: [facility]  This test is not yet approved or  cleared by the 03/28/20 FDA and has been authorized for detection and/or diagnosis of SARS-CoV-2 by FDA under an Emergency Use Authorization (EUA).  This EUA will remain in effect (meaning this  test can be used) for the duration of the COVID-19 declaration under Section 564(b)(1) of the Act, 21 U.S.C. section 360bbb-3(b)(1), unless the authorization is terminated or revoked sooner.  Performed at [facility], [address], [place], [place]      Scheduled Meds:  docusate sodium  100 mg Oral BID   enoxaparin (LOVENOX) injection  40 mg Subcutaneous Q24H   levothyroxine  88 mcg  Oral Q0600   polyethylene glycol  17 g Oral Daily   potassium chloride  40 mEq Oral BID   sodium chloride flush  3 mL Intravenous Once   Continuous Infusions:  piperacillin-tazobactam (ZOSYN)  IV 3.375 g (03/31/20 0508)     LOS: 5 days   Cherene Altes, MD [facility] Office  [phone] Pager - Text Page per Shea Evans  If 7PM-7AM, please contact night-coverage per Amion 03/31/2020, 7:22 AM

## 2020-03-31 NOTE — Progress Notes (Signed)
[facility] Progress Note     Subjective: Feeling better each day including today. Having flatus and BM yesterday. Denies n/v.    Objective: Vital signs in last 24 hours: Temp:  [97.6 F (36.4 C)-98.1 F (36.7 C)] 97.8 F (36.6 C) (06/19 0527) Pulse Rate:  [55-61] 61 (06/19 0527) Resp:  [18] 18 (06/19 0527) BP: (125-156)/(65-102) 142/77 (06/19 0527) SpO2:  [97 %-100 %] 100 % (06/19 0527) Last BM Date: 03/30/20  Intake/Output from previous day: 06/18 0701 - 06/19 0700 In: 1270.3 [P.O.:840; IV Piggyback:430.3] Out: 1400 [Urine:1400] Intake/Output this shift: No intake/output data recorded.  PE: Gen:  Alert, NAD, pleasant Pulm: Normal rate and effort  ELF:YBOF,BPZWC abdomen that does not appear to be distended. Minimal LLQ tenderness. There is no rebound, rigidity or guarding. No peritonitis Ext:  No LE edema  Psych: A&Ox3  Skin: no rashes noted, warm and dry   Lab Results:  Recent Labs    03/29/20 0456 03/30/20 0507  WBC 4.4 4.1  HGB 13.3 13.9  HCT 40.7 43.2  PLT 165 169   BMET Recent Labs    03/30/20 0507 03/31/20 0543  NA 136 136  K 3.4* 4.7  CL 105 106  CO2 24 26  GLUCOSE 87 95  BUN <5* <5*  CREATININE 0.80 0.97  CALCIUM 8.6* 8.7*   PT/INR No results for input(s): LABPROT, INR in the last 72 hours. CMP     Component Value Date/Time   NA 136 03/31/2020 0543   NA 141 12/09/2016 1113   K 4.7 03/31/2020 0543   CL 106 03/31/2020 0543   CO2 26 03/31/2020 0543   GLUCOSE 95 03/31/2020 0543   BUN <5 (L) 03/31/2020 0543   BUN 10 12/09/2016 1113   CREATININE 0.97 03/31/2020 0543   CREATININE 0.84 10/20/2016 1205   CALCIUM 8.7 (L) 03/31/2020 0543   PROT 7.0 03/29/2020 0456   PROT 7.8 11/12/2016 1042   ALBUMIN 3.6 03/29/2020 0456   ALBUMIN 4.0 11/12/2016 1042   AST 26 03/29/2020 0456   ALT 26 03/29/2020 0456   ALKPHOS 50 03/29/2020 0456   BILITOT 0.9 03/29/2020 0456   BILITOT 0.6 11/12/2016 1042   GFRNONAA >60 03/31/2020 0543    GFRNONAA >89 10/20/2016 1205   GFRAA >60 03/31/2020 0543   GFRAA >89 10/20/2016 1205   Lipase     Component Value Date/Time   LIPASE 26 03/26/2020 1710       Studies/Results: VAS [place] LOWER EXTREMITY VENOUS (DVT)  Result Date: 03/30/2020  Lower Venous DVTStudy Indications: Pain, and decreased mobility.  Limitations: Body habitus. Comparison Study: No prior study on file Performing Technologist: Sharion Dove RVS  Examination Guidelines: A complete evaluation includes B-mode imaging, spectral Doppler, color Doppler, and power Doppler as needed of all accessible portions of each vessel. Bilateral testing is considered an integral part of a complete examination. Limited examinations for reoccurring indications may be performed as noted. The reflux portion of the exam is performed with the patient in reverse Trendelenburg.  +---------+---------------+---------+-----------+----------+--------------+ RIGHT    CompressibilityPhasicitySpontaneityPropertiesThrombus Aging +---------+---------------+---------+-----------+----------+--------------+ CFV      Full           Yes      Yes                                 +---------+---------------+---------+-----------+----------+--------------+ SFJ      Full                                                        +---------+---------------+---------+-----------+----------+--------------+  FV Prox  Full                                                        +---------+---------------+---------+-----------+----------+--------------+ FV Mid   Full                                                        +---------+---------------+---------+-----------+----------+--------------+ FV DistalFull                                                        +---------+---------------+---------+-----------+----------+--------------+ PFV      Full                                                         +---------+---------------+---------+-----------+----------+--------------+ POP      Full           Yes      Yes                                 +---------+---------------+---------+-----------+----------+--------------+ PTV      Full                                                        +---------+---------------+---------+-----------+----------+--------------+ PERO     Full                                                        +---------+---------------+---------+-----------+----------+--------------+   +---------+---------------+---------+-----------+----------+--------------+ LEFT     CompressibilityPhasicitySpontaneityPropertiesThrombus Aging +---------+---------------+---------+-----------+----------+--------------+ CFV      Full           Yes      Yes                                 +---------+---------------+---------+-----------+----------+--------------+ SFJ      Full                                                        +---------+---------------+---------+-----------+----------+--------------+ FV Prox  Full                                                        +---------+---------------+---------+-----------+----------+--------------+  FV Mid   Full                                                        +---------+---------------+---------+-----------+----------+--------------+ FV DistalFull                                                        +---------+---------------+---------+-----------+----------+--------------+ PFV      Full                                                        +---------+---------------+---------+-----------+----------+--------------+ POP      Full           Yes      Yes                                 +---------+---------------+---------+-----------+----------+--------------+ PTV      Full                                                         +---------+---------------+---------+-----------+----------+--------------+ PERO     Full                                                        +---------+---------------+---------+-----------+----------+--------------+     Summary: BILATERAL: - No evidence of deep vein thrombosis seen in the lower extremities, bilaterally. -   *See table(s) above for measurements and observations. Electronically signed by Lemar Livings MD on 03/30/2020 at 4:31:16 PM.    Final     Anti-infectives: Anti-infectives (From admission, onward)   Start     Dose/Rate Route Frequency Ordered Stop   03/27/20 0930  piperacillin-tazobactam (ZOSYN) IVPB 3.375 g     Discontinue     3.375 g 12.5 mL/hr over 240 Minutes Intravenous Every 8 hours 03/27/20 0844     03/26/20 2300  piperacillin-tazobactam (ZOSYN) IVPB 3.375 g        3.375 g 100 mL/hr over 30 Minutes Intravenous  Once 03/26/20 2246 03/27/20 0036       Assessment/Plan Hypertension Hypothyroidism Morbid obesity - BMI 55.37  Sigmoid Diverticulitis with Phlegmon - Patient failed outpatient oral abx  - Continue IV abx today - if continues to do well, will see about PO cipro/flagyl Sunday. - Advance to soft diet - Phlegmon on CT does not appear to be drainable at this time. -Previously discussed with the patient the possibility of requiring a repeat CT later this week if she is not improving to see if phlegmon has developed into a drainable abscess by IR. We also discussed the possibility if she is not improving she may  require a ex lap, colectomy and likely colostomy during admission. Ideally she will improve without needing surgery and can follow-up in the office to discuss elective colectomy with Dr. Michaell Cowing as this is now her 3rd episode of diverticulitis. She will need a colonoscopy in 6-8 weeks if she does improve without requiring a procedure as aninpatient. - We will continue to follow along with you  FEN - soft VTE -SCDs, okay forchemical  prophylaxis from general surgery standpoint ID -Zosyn 6/14 >> WBC 4.6  LOS: 5 days   Marin Olp, M.D. [facility] Use [email or web address] to contact on call provider

## 2020-03-31 NOTE — Progress Notes (Signed)
Pharmacy LMWH dose adjustment for BMI 55.3  Plan: LMWH 0.5 mg/kg = 80 sq q24 for VTE px  Herby Abraham, Pharm.D 03/31/2020 10:31 AM

## 2020-04-01 MED ORDER — METRONIDAZOLE 500 MG PO TABS: 500.0000 mg | ORAL_TABLET | Freq: Three times a day (TID) | ORAL | 0 refills | Status: AC

## 2020-04-01 MED ORDER — CIPROFLOXACIN HCL 500 MG PO TABS
500.0000 mg | ORAL_TABLET | Freq: Two times a day (BID) | ORAL | Status: DC
  Administered 2020-04-01: 500 mg via ORAL
  Filled 2020-04-01: qty 1

## 2020-04-01 MED ORDER — METRONIDAZOLE 500 MG PO TABS
500.0000 mg | ORAL_TABLET | Freq: Three times a day (TID) | ORAL | Status: DC
  Administered 2020-04-01: 500 mg via ORAL
  Filled 2020-04-01: qty 1

## 2020-04-01 MED ORDER — CIPROFLOXACIN HCL 500 MG PO TABS: 500.0000 mg | ORAL_TABLET | Freq: Two times a day (BID) | ORAL | 0 refills | Status: AC

## 2020-04-01 MED ORDER — ACETAMINOPHEN 325 MG PO TABS: 650.0000 mg | ORAL_TABLET | Freq: Four times a day (QID) | ORAL | Status: DC | PRN

## 2020-04-01 MED ORDER — FLUCONAZOLE 150 MG PO TABS: 150.0000 mg | ORAL_TABLET | Freq: Once | ORAL | 0 refills | Status: AC

## 2020-04-01 NOTE — Discharge Summary (Signed)
DISCHARGE SUMMARY  Sey[place] C Shoemaker  MR#: [id number]  DOB:11-Feb-1986  Date of Admission: 03/26/2020 Date of Discharge: 04/01/2020  Attending Physician:Nola Botkins Silvestre Gunner, MD  Patient's NUU:VOZDGUY, Shea Stakes, NP  Consults: Gen Surgery   Disposition: d/c home   Follow-up Appts:  Follow-up Information    Claiborne Rigg, NP Follow up in 1 week(s).   Specialty: Nurse Practitioner Contact information: [address] [place] [address] [phone]        Karie Soda, MD. Schedule an appointment as soon as possible for a visit in 10 day(s).   Specialty: General Surgery Contact information: [address] [place] [address] [phone]               Discharge Diagnoses: Acute sigmoid diverticulitis R LE calf pain - resolved  HTN Hypothyroidism Morbid obesity - Body mass index is 55.37 kg/m. Hypokalemia  Initial presentation: 34 year old with a history of HTN and hypothyroidism admitted in October 2020 with acute sigmoid diverticulitis complicated by microperforation which was able to be managed conservatively who returned to the ED 6/15 for the second visit within 10 days complaining of worsening left lower quadrant abdominal pain. This was accompanied by 3 days of nausea vomiting and diarrhea. The patient was initially discharged home March 17, 2020 on oral antibiotics after CT confirmed the diagnosis of diverticulitis. Despite compliance with antibiotics her pain did not improve and she therefore returned to the ED 6/15.  Hospital Course: 6/5 ED visit -diagnosis of diverticulitis -discharged w/ oral antibiotics 6/15 return ED visit - persisting symptoms - admission 6/17 bilateral lower extremity venous duplex negative 6/20 d/c home on oral abx   Acute sigmoid diverticulitis tx w/ IV abx and bowel rest during admit - general surgery to advanced diet as pt improved - patient appears to be making progress at time of d/c and is tolerating diet w/o  difficulty - transition to oral abx and d/c home w/ outpt Gen Surg f/u   R LE calf pain No evidence of DVT on bilateral lower extremity venous duplex 6/17 -suspect muscular cramping related to bed rest -mobilize - essentially resolved at time of d/c   HTN No changes made in tx regimen   Hypothyroidism Continue usual medical therapy  Morbid obesity - Body mass index is 55.37 kg/m.  Hypokalemia due to GI losses and poor intake - corrected to normal during admission   Allergies as of 04/01/2020      Reactions   Dilaudid [hydromorphone] Rash   Itching all over body      Medication List    TAKE these medications   acetaminophen 325 MG tablet Commonly known as: TYLENOL Take 2 tablets (650 mg total) by mouth every 6 (six) hours as needed for mild pain or fever.   albuterol 108 (90 Base) MCG/ACT inhaler Commonly known as: VENTOLIN HFA Inhale 2 puffs into the lungs every 6 (six) hours as needed for wheezing or shortness of breath.   ciprofloxacin 500 MG tablet Commonly known as: CIPRO Take 1 tablet (500 mg total) by mouth 2 (two) times daily for 8 days.   ibuprofen 200 MG tablet Commonly known as: ADVIL Take 800 mg by mouth every 6 (six) hours as needed for moderate pain.   levothyroxine 88 MCG tablet Commonly known as: SYNTHROID Take 1 tablet (88 mcg total) by mouth daily before breakfast.   metroNIDAZOLE 500 MG tablet Commonly known as: FLAGYL Take 1 tablet (500 mg total) by mouth 3 (three) times daily  for 8 days.       Day of Discharge BP (!) 156/84 (BP Location: Right Arm)   Pulse (!) 57   Temp 97.6 F (36.4 C) (Oral)   Resp 16   Ht 5\' 6"  (1.676 m)   Wt (!) 155.6 kg   SpO2 100%   BMI 55.37 kg/m   Physical Exam: General: No acute respiratory distress Lungs: Clear to auscultation bilaterally without wheezes or crackles Cardiovascular: Regular rate and rhythm without murmur gallop or rub normal S1 and S2 Abdomen: Nontender, nondistended, soft, bowel  sounds positive, no rebound, no ascites, no appreciable mass Extremities: No significant cyanosis, clubbing, or edema bilateral lower extremities  Basic Metabolic Panel: Recent Labs  Lab 03/27/20 0931 03/28/20 0528 03/29/20 0456 03/30/20 0507 03/31/20 0543  NA 138 137 139 136 136  K 3.4* 3.3* 3.5 3.4* 4.7  CL 106 107 108 105 106  CO2 22 21* 25 24 26   GLUCOSE 97 95 88 87 95  BUN 7 8 <5* <5* <5*  CREATININE 0.75 0.72 0.66 0.80 0.97  CALCIUM 8.8* 8.4* 8.7* 8.6* 8.7*  MG  --  1.8 2.0  --  1.9    Liver Function Tests: Recent Labs  Lab 03/26/20 1710 03/27/20 0931 03/28/20 0528 03/29/20 0456  AST 23 25 28 26   ALT 21 21 25 26   ALKPHOS 63 57 50 50  BILITOT 0.9 0.7 0.6 0.9  PROT 8.0 7.4 6.7 7.0  ALBUMIN 4.0 3.7 3.5 3.6   Recent Labs  Lab 03/26/20 1710  LIPASE 26    CBC: Recent Labs  Lab 03/26/20 1710 03/27/20 0931 03/28/20 0528 03/29/20 0456 03/30/20 0507  WBC 5.2 4.2 4.6 4.4 4.1  NEUTROABS  --  1.5* 1.4*  --   --   HGB 14.8 14.5 13.5 13.3 13.9  HCT 45.6 44.1 41.2 40.7 43.2  MCV 95.6 96.5 96.9 96.9 97.3  PLT 189 169 162 165 169    Recent Results (from the past 240 hour(s))  SARS Coronavirus 2 by RT PCR (hospital order, performed in [facility] hospital lab) Nasopharyngeal Nasopharyngeal Swab     Status: None   Collection Time: 03/26/20 11:02 PM   Specimen: Nasopharyngeal Swab  Result Value Ref Range Status   SARS Coronavirus 2 NEGATIVE NEGATIVE Final    Comment: (NOTE) SARS-CoV-2 target nucleic acids are NOT DETECTED.  The SARS-CoV-2 RNA is generally detectable in upper and lower respiratory specimens during the acute phase of infection. The lowest concentration of SARS-CoV-2 viral copies this assay can detect is 250 copies / mL. A negative result does not preclude SARS-CoV-2 infection and should not be used as the sole basis for treatment or other patient management decisions.  A negative result may occur with improper specimen collection / handling,  submission of specimen other than nasopharyngeal swab, presence of viral mutation(s) within the areas targeted by this assay, and inadequate number of viral copies (<250 copies / mL). A negative result must be combined with clinical observations, patient history, and epidemiological information.  Fact Sheet for Patients:   03/31/20  Fact Sheet for Healthcare Providers: 04/01/20  This test is not yet approved or  cleared by the [facility] FDA and has been authorized for detection and/or diagnosis of SARS-CoV-2 by FDA under an Emergency Use Authorization (EUA).  This EUA will remain in effect (meaning this test can be used) for the duration of the COVID-19 declaration under Section 564(b)(1) of the Act, 21 U.S.C. section 360bbb-3(b)(1), unless the authorization is terminated  or revoked sooner.  Performed at [facility], [place] [address], [place], [place] [address]      Time spent in discharge (includes decision making & examination of pt): 35 minutes  04/01/2020, 11:39 AM   Cherene Altes, MD [facility] Office  [phone]

## 2020-04-01 NOTE — Progress Notes (Signed)
Nurse reviewed discharge instructions with pt.  Pt verbalized understanding of discharge instructions, follow up appointments and new medications.  No concerns at time of discharge. 

## 2020-04-01 NOTE — Discharge Instructions (Signed)
Diverticulitis  Diverticulitis is infection or inflammation of small pouches (diverticula) in the colon that form due to a condition called diverticulosis. Diverticula can trap stool (feces) and bacteria, causing infection and inflammation. Diverticulitis may cause severe stomach pain and diarrhea. It may lead to tissue damage in the colon that causes bleeding. The diverticula may also burst (rupture) and cause infected stool to enter other areas of the abdomen. Complications of diverticulitis can include:  Bleeding.  Severe infection.  Severe pain.  Rupture (perforation) of the colon.  Blockage (obstruction) of the colon. What are the causes? This condition is caused by stool becoming trapped in the diverticula, which allows bacteria to grow in the diverticula. This leads to inflammation and infection. What increases the risk? You are more likely to develop this condition if:  You have diverticulosis. The risk for diverticulosis increases if: ? You are overweight or obese. ? You use tobacco products. ? You do not get enough exercise.  You eat a diet that does not include enough fiber. High-fiber foods include fruits, vegetables, beans, nuts, and whole grains. What are the signs or symptoms? Symptoms of this condition may include:  Pain and tenderness in the abdomen. The pain is normally located on the left side of the abdomen, but it may occur in other areas.  Fever and chills.  Bloating.  Cramping.  Nausea.  Vomiting.  Changes in bowel routines.  Blood in your stool. How is this diagnosed? This condition is diagnosed based on:  Your medical history.  A physical exam.  Tests to make sure there is nothing else causing your condition. These tests may include: ? Blood tests. ? Urine tests. ? Imaging tests of the abdomen, including X-rays, ultrasounds, MRIs, or CT scans. How is this treated? Most cases of this condition are mild and can be treated at home.  Treatment may include:  Taking over-the-counter pain medicines.  Following a clear liquid diet.  Taking antibiotic medicines by mouth.  Rest. More severe cases may need to be treated at a hospital. Treatment may include:  Not eating or drinking.  Taking prescription pain medicine.  Receiving antibiotic medicines through an IV tube.  Receiving fluids and nutrition through an IV tube.  Surgery. When your condition is under control, your health care provider may recommend that you have a colonoscopy. This is an exam to look at the entire large intestine. During the exam, a lubricated, bendable tube is inserted into the anus and then passed into the rectum, colon, and other parts of the large intestine. A colonoscopy can show how severe your diverticula are and whether something else may be causing your symptoms. Follow these instructions at home: Medicines  Take over-the-counter and prescription medicines only as told by your health care provider. These include fiber supplements, probiotics, and stool softeners.  If you were prescribed an antibiotic medicine, take it as told by your health care provider. Do not stop taking the antibiotic even if you start to feel better.  Do not drive or use heavy machinery while taking prescription pain medicine. General instructions   Follow a full liquid diet or another diet as directed by your health care provider. After your symptoms improve, your health care provider may tell you to change your diet. He or she may recommend that you eat a diet that contains at least 25 g (25 grams) of fiber daily. Fiber makes it easier to pass stool. Healthy sources of fiber include: ? Berries. One cup contains 4-8 grams of   fiber. ? Beans or lentils. One half cup contains 5-8 grams of fiber. ? Green vegetables. One cup contains 4 grams of fiber.  Exercise for at least 30 minutes, 3 times each week. You should exercise hard enough to raise your heart rate and  break a sweat.  Keep all follow-up visits as told by your health care provider. This is important. You may need a colonoscopy. Contact a health care provider if:  Your pain does not improve.  You have a hard time drinking or eating food.  Your bowel movements do not return to normal. Get help right away if:  Your pain gets worse.  Your symptoms do not get better with treatment.  Your symptoms suddenly get worse.  You have a fever.  You vomit more than one time.  You have stools that are bloody, black, or tarry. Summary  Diverticulitis is infection or inflammation of small pouches (diverticula) in the colon that form due to a condition called diverticulosis. Diverticula can trap stool (feces) and bacteria, causing infection and inflammation.  You are at higher risk for this condition if you have diverticulosis and you eat a diet that does not include enough fiber.  Most cases of this condition are mild and can be treated at home. More severe cases may need to be treated at a hospital.  When your condition is under control, your health care provider may recommend that you have an exam called a colonoscopy. This exam can show how severe your diverticula are and whether something else may be causing your symptoms. This information is not intended to replace advice given to you by your health care provider. Make sure you discuss any questions you have with your health care provider. Document Revised: 09/11/2017 Document Reviewed: 11/01/2016 Elsevier Patient Education  2020 Elsevier Inc.  

## 2020-04-01 NOTE — Progress Notes (Signed)
[facility] [place] [facility] Progress Note     Subjective: Feeling better each day including today. Having flatus and BM. Denies n/v.    Objective: Vital signs in last 24 hours: Temp:  [97.6 F (36.4 C)-98.5 F (36.9 C)] 97.6 F (36.4 C) (06/20 0523) Pulse Rate:  [57-69] 57 (06/20 0523) Resp:  [16-18] 16 (06/20 0523) BP: (109-160)/(66-112) 156/84 (06/20 0523) SpO2:  [100 %] 100 % (06/20 0523) Last BM Date: 03/31/20  Intake/Output from previous day: 06/19 0701 - 06/20 0700 In: 268.2 [P.O.:120; IV Piggyback:148.2] Out: 1800 [Urine:1800] Intake/Output this shift: No intake/output data recorded.  PE: Gen:  Alert, NAD, pleasant Pulm: Normal rate and effort  FYB:OFBP,ZWCHE abdomen that does not appear to be distended. No LLQ tenderness. There is no rebound, rigidity or guarding. No peritonitis Ext:  No LE edema  Psych: A&Ox3  Skin: no rashes noted, warm and dry   Lab Results:  Recent Labs    03/30/20 0507  WBC 4.1  HGB 13.9  HCT 43.2  PLT 169   BMET Recent Labs    03/30/20 0507 03/31/20 0543  NA 136 136  K 3.4* 4.7  CL 105 106  CO2 24 26  GLUCOSE 87 95  BUN <5* <5*  CREATININE 0.80 0.97  CALCIUM 8.6* 8.7*   PT/INR No results for input(s): LABPROT, INR in the last 72 hours. CMP     Component Value Date/Time   NA 136 03/31/2020 0543   NA 141 12/09/2016 1113   K 4.7 03/31/2020 0543   CL 106 03/31/2020 0543   CO2 26 03/31/2020 0543   GLUCOSE 95 03/31/2020 0543   BUN <5 (L) 03/31/2020 0543   BUN 10 12/09/2016 1113   CREATININE 0.97 03/31/2020 0543   CREATININE 0.84 10/20/2016 1205   CALCIUM 8.7 (L) 03/31/2020 0543   PROT 7.0 03/29/2020 0456   PROT 7.8 11/12/2016 1042   ALBUMIN 3.6 03/29/2020 0456   ALBUMIN 4.0 11/12/2016 1042   AST 26 03/29/2020 0456   ALT 26 03/29/2020 0456   ALKPHOS 50 03/29/2020 0456   BILITOT 0.9 03/29/2020 0456   BILITOT 0.6 11/12/2016 1042   GFRNONAA >60 03/31/2020 0543   GFRNONAA >89 10/20/2016 1205   GFRAA >60 03/31/2020  0543   GFRAA >89 10/20/2016 1205   Lipase     Component Value Date/Time   LIPASE 26 03/26/2020 1710       Studies/Results: No results found.  Anti-infectives: Anti-infectives (From admission, onward)   Start     Dose/Rate Route Frequency Ordered Stop   03/27/20 0930  piperacillin-tazobactam (ZOSYN) IVPB 3.375 g     Discontinue     3.375 g 12.5 mL/hr over 240 Minutes Intravenous Every 8 hours 03/27/20 0844     03/26/20 2300  piperacillin-tazobactam (ZOSYN) IVPB 3.375 g        3.375 g 100 mL/hr over 30 Minutes Intravenous  Once 03/26/20 2246 03/27/20 0036       Assessment/Plan Hypertension Hypothyroidism Morbid obesity - BMI 55.37  Sigmoid Diverticulitis with Phlegmon - Patient 'failed' outpatient oral abx with Augmentin previously - Ok for PO cipro/flagyl; previously tolerated this. Would plan on 14 day course. Ok for discharge home today on this regimen -Previously discussed with the patient the possibility of requiring a repeat CT later this week if she is not improving to see if phlegmon has developed into a drainable abscess by IR. We also discussed the possibility if she is not improving she may require a ex lap, colectomy and likely colostomy  during admission. Ideally she will improve without needing surgery and can follow-up in the office to discuss elective colectomy with Dr. Michaell Cowing as this is now her 3rd episode of diverticulitis. She will need a colonoscopy in 6-8 weeks if she does improve without requiring a procedure as aninpatient. - We will continue to follow along with you  FEN - soft VTE -SCDs, okay forchemical prophylaxis from general surgery standpoint ID -Zosyn 6/14 >> WBC 4.6  LOS: 6 days   Marin Olp, M.D. [facility] Use [email or web address] to contact on call provider

## 2020-04-02 ENCOUNTER — Telehealth: Payer: Self-pay

## 2020-04-02 NOTE — Telephone Encounter (Signed)
Transition Care Management Follow-up Telephone Call Date of discharge and from where:04/01/2020, [facility]   Call placed to patient  # [phone], message left with call back requested to this CM.    Need to discuss scheduling follow up appt with PCP

## 2020-04-03 ENCOUNTER — Telehealth: Payer: Self-pay

## 2020-04-03 NOTE — Telephone Encounter (Signed)
Transition Care Management Follow-up Telephone Call  Date of discharge and from where: 04/01/2020, [facility]  How have you been since you were released from the hospital? She stated that she is feeling a little better, some intermittent left lower quadrant abdominal pain.  She rates the pain 3/10 and said that she has not needed to take any tylenol for pain relief.   Any questions or concerns?  none at this time   Items Reviewed:  Did the pt receive and understand the discharge instructions provided? yes  Medications obtained and verified?  yes, she has all medications including the new ones and did not have any questions.   Any new allergies since your discharge?  none reported   Do you have support at home?  she said she is alone  Other (ie: DME, Home Health, etc) no home health or DME ordered.   Functional Questionnaire: (I = Independent and D = Dependent) ADL's: independent   Follow up appointments reviewed:    PCP Hospital f/u appt confirmed?Scheduled appt with Ms Meredeth Ide, NP 04/23/2020 @ 1430  Specialist Hospital f/u appt confirmed?GYN appointment - 04/05/2020.  She needs to contact the surgeon and schedule her follow up appt.   Are transportation arrangements needed?  no, she drives  If their condition worsens, is the pt aware to call  their PCP or go to the ED? yes  Was the patient provided with contact information for the PCP's office or ED? she has the clinic phone number  Was the pt encouraged to call back with questions or concerns?  yes

## 2020-04-05 ENCOUNTER — Encounter: Payer: Self-pay | Admitting: Family Medicine

## 2020-04-05 ENCOUNTER — Ambulatory Visit: Payer: Self-pay | Admitting: Obstetrics & Gynecology

## 2020-04-10 ENCOUNTER — Other Ambulatory Visit (HOSPITAL_COMMUNITY): Payer: Self-pay | Admitting: Surgery

## 2020-04-10 ENCOUNTER — Ambulatory Visit (HOSPITAL_COMMUNITY)
Admission: RE | Admit: 2020-04-10 | Discharge: 2020-04-10 | Disposition: A | Discharge status: Home / Self Care | Payer: Self-pay | Source: Ambulatory Visit | Attending: Surgery | Admitting: Surgery

## 2020-04-10 ENCOUNTER — Other Ambulatory Visit: Payer: Self-pay

## 2020-04-10 DIAGNOSIS — R109 Unspecified abdominal pain: Secondary | ICD-10-CM

## 2020-04-10 DIAGNOSIS — Z8719 Personal history of other diseases of the digestive system: Secondary | ICD-10-CM

## 2020-04-10 MED ORDER — SODIUM CHLORIDE (PF) 0.9 % IJ SOLN
INTRAMUSCULAR | Status: AC
  Filled 2020-04-10: qty 50

## 2020-04-10 MED ORDER — IOHEXOL 9 MG/ML PO SOLN
1000.0000 mL | ORAL | Status: AC
  Administered 2020-04-10: 1000 mL via ORAL

## 2020-04-10 MED ORDER — IOHEXOL 300 MG/ML  SOLN
100.0000 mL | Freq: Once | INTRAMUSCULAR | Status: AC | PRN
  Administered 2020-04-10: 100 mL via INTRAVENOUS

## 2020-04-10 MED ORDER — IOHEXOL 9 MG/ML PO SOLN
ORAL | Status: AC
  Filled 2020-04-10: qty 1000

## 2020-04-23 ENCOUNTER — Other Ambulatory Visit: Payer: Self-pay

## 2020-04-23 ENCOUNTER — Ambulatory Visit: Payer: Self-pay | Attending: Nurse Practitioner | Admitting: Nurse Practitioner

## 2020-05-11 ENCOUNTER — Ambulatory Visit: Payer: Self-pay | Admitting: Obstetrics and Gynecology

## 2020-07-18 ENCOUNTER — Other Ambulatory Visit: Payer: Self-pay | Admitting: Family Medicine

## 2020-07-18 ENCOUNTER — Other Ambulatory Visit: Payer: Self-pay | Admitting: Nurse Practitioner

## 2020-07-18 DIAGNOSIS — E039 Hypothyroidism, unspecified: Secondary | ICD-10-CM

## 2020-07-18 MED ORDER — LEVOTHYROXINE SODIUM 88 MCG PO TABS: 88.0000 ug | ORAL_TABLET | Freq: Every day | ORAL | 0 refills | Status: DC

## 2020-07-19 MED FILL — LEVOTHYROXINE 88 MCG TABLET: 88 | 30 days supply | Qty: 30 | Fill #0

## 2020-08-01 MED FILL — LEVOTHYROXINE 88 MCG TABLET: 88 | 30 days supply | Qty: 30 | Fill #0

## 2020-08-21 ENCOUNTER — Telehealth: Payer: Self-pay | Admitting: Critical Care Medicine

## 2020-08-27 ENCOUNTER — Telehealth: Payer: Self-pay | Admitting: *Deleted

## 2020-08-27 NOTE — Telephone Encounter (Signed)
MA unable to reach patient to confirm or reschedule virtual visit on 11/16 at 11 am. Medical Assistant left message on patient's home and cell voicemail. Voicemail states to give a call back to [place] with [facility] at [phone].

## 2020-08-28 ENCOUNTER — Ambulatory Visit: Payer: Self-pay | Attending: Critical Care Medicine | Admitting: Family

## 2020-08-28 ENCOUNTER — Other Ambulatory Visit: Payer: Self-pay

## 2020-08-28 DIAGNOSIS — M5441 Lumbago with sciatica, right side: Secondary | ICD-10-CM

## 2020-08-28 DIAGNOSIS — M5442 Lumbago with sciatica, left side: Secondary | ICD-10-CM

## 2020-08-28 DIAGNOSIS — G8929 Other chronic pain: Secondary | ICD-10-CM

## 2020-08-28 MED ORDER — GABAPENTIN 300 MG PO CAPS: 300.0000 mg | ORAL_CAPSULE | Freq: Two times a day (BID) | ORAL | 1 refills | Status: DC

## 2020-08-28 MED ORDER — CYCLOBENZAPRINE HCL 10 MG PO TABS: 10.0000 mg | ORAL_TABLET | Freq: Every day | ORAL | 0 refills | Status: DC

## 2020-08-28 NOTE — Progress Notes (Signed)
Virtual Visit via Telephone Note I connected with Lori Mcknight, on 08/28/2020 at 11:17 AM by telephone due to the COVID-19 pandemic and verified that I am speaking with the correct person using two identifiers.  Due to current restrictions/limitations of in-office visits due to the COVID-19 pandemic, this scheduled clinical appointment was converted to a telehealth visit.   Consent: I discussed the limitations, risks, security and privacy concerns of performing an evaluation and management service by telephone and the availability of in person appointments. I also discussed with the patient that there may be a patient responsible charge related to this service. The patient expressed understanding and agreed to proceed.  Location of Patient: Home  Location of Provider: [facility]  Persons participating in Telemedicine visit: Lori Mcknight Castell [name], NP [place], [place]  History of Present Illness: Lori Mcknight is a 34 year-old female with history of essential hypertension, migraines, acute diverticulitis, PCOS, hypothyroid, abnormal uterine bleeding, dysfunctional uterine bleeding, obesity morbid BMI 50 or higher, endometriosis, and dysuria who presents for hospital follow-up.   1. BACK PAIN FOLLOW-UP: 11/17/2019: Visit with nurse practitioner Meredeth Ide. Patient with chronic bilateral low back pain with sciatica. Lyrica prescribed.   08/28/2020:  Location: bilateral lower back  Onset:  Description: 6-7/10, stiffness, feels like bones are jerking out of place, both feet going numb intermittently, hears crunching sound  Radiation: right leg  Modifying factors:   Symptoms Worse with: cold weather   Better with: heat sometimes, Icy Hot spray both give mild relief  Trauma: denies  Popping/clicking sounds with movement/bending: yes  Muscle spasms: yes  Quit taking Lyrica because it made her feel funny only took it 2 or 3 weeks.   Past Medical History:   Diagnosis Date  . Anemia    history of anemia  . Anxiety   . Asthma    inhaler as needed  . Chest pain of uncertain etiology    intermittent left side chest pain  . Chlamydia   . Depression   . Endometriosis   . Genital herpes   . Headache    MIGRAINES  . Hypertension    no meds since April. needs new px per pt   . Hypothyroidism   . Morbid obesity (HCC)   . Obese   . Obesity, Class III, BMI 40-49.9 (morbid obesity) (HCC) 11/06/2011  . PCOS (polycystic ovarian syndrome)   . PONV (postoperative nausea and vomiting)   . Shortness of breath    on exertion from weight  . Thyroid disease   . Urinary tract infection    Allergies  Allergen Reactions  . Dilaudid [Hydromorphone] Rash    Itching all over body    Current Outpatient Medications on File Prior to Visit  Medication Sig Dispense Refill  . acetaminophen (TYLENOL) 325 MG tablet Take 2 tablets (650 mg total) by mouth every 6 (six) hours as needed for mild pain or fever.    Marland Kitchen albuterol (PROVENTIL HFA;VENTOLIN HFA) 108 (90 Base) MCG/ACT inhaler Inhale 2 puffs into the lungs every 6 (six) hours as needed for wheezing or shortness of breath. 1 Inhaler 2  . ibuprofen (ADVIL) 200 MG tablet Take 800 mg by mouth every 6 (six) hours as needed for moderate pain.    Marland Kitchen levothyroxine (SYNTHROID) 88 MCG tablet Take 1 tablet (88 mcg total) by mouth daily before breakfast. 30 tablet 0   No current facility-administered medications on file prior to visit.    Observations/Objective: Alert and oriented  x 3. Not in acute distress. Physical examination not completed as this is a telemedicine visit.  Assessment and Plan: 1. Chronic bilateral low back pain with bilateral sciatica: - Patient with chronic back pain.  - Last visit with primary provider 11/17/2019. Prescribed Pregabalin. Today patient reports she quit taking medication shortly after because she did not feel well with the medication.  - Begin Gabapentin for pain management.  -  Begin Cyclobenzaprine for muscle spasms. May cause drowsiness. Counseled to not consume if operating heavy machinery or driving. Counseled to not consume with alcohol. Patient verbalized understanding.  - Diagnostic x-ray of lumbar spine.  - Follow-up with primary provider as needed.  - DG Lumbar Spine Complete; Future - gabapentin (NEURONTIN) 300 MG capsule; Take 1 capsule (300 mg total) by mouth 2 (two) times daily.  Dispense: 60 capsule; Refill: 1 - cyclobenzaprine (FLEXERIL) 10 MG tablet; Take 1 tablet (10 mg total) by mouth at bedtime.  Dispense: 30 tablet; Refill: 0  Follow Up Instructions: Follow-up with primary provider as needed.   Patient was given clear instructions to go to Emergency Department or return to medical center if symptoms don't improve, worsen, or new problems develop.The patient verbalized understanding.  I discussed the assessment and treatment plan with the patient. The patient was provided an opportunity to ask questions and all were answered. The patient agreed with the plan and demonstrated an understanding of the instructions.   The patient was advised to call back or seek an in-person evaluation if the symptoms worsen or if the condition fails to improve as anticipated.   I provided 20 minutes total of non-face-to-face time during this encounter including median intraservice time, reviewing previous notes, labs, imaging, medications, management and patient verbalized understanding.    Rema Fendt, NP  [facility] [place], [place] [phone]   08/28/2020, 11:12 AM

## 2020-08-28 NOTE — Patient Instructions (Signed)

## 2020-09-11 ENCOUNTER — Encounter: Payer: Self-pay | Admitting: Family Medicine

## 2020-09-11 ENCOUNTER — Other Ambulatory Visit: Payer: Self-pay

## 2020-09-11 ENCOUNTER — Ambulatory Visit: Payer: Self-pay | Attending: Family Medicine | Admitting: Family Medicine

## 2020-09-11 VITALS — BP 102/66 | HR 78 | Ht 66.0 in | Wt 347.0 lb

## 2020-09-11 DIAGNOSIS — Z0000 Encounter for general adult medical examination without abnormal findings: Secondary | ICD-10-CM

## 2020-09-11 DIAGNOSIS — Z124 Encounter for screening for malignant neoplasm of cervix: Secondary | ICD-10-CM

## 2020-09-11 DIAGNOSIS — N939 Abnormal uterine and vaginal bleeding, unspecified: Secondary | ICD-10-CM

## 2020-09-11 DIAGNOSIS — Z113 Encounter for screening for infections with a predominantly sexual mode of transmission: Secondary | ICD-10-CM

## 2020-09-11 NOTE — Progress Notes (Signed)
Subjective:  Patient ID: Lori Mcknight, female    DOB: May 01, 1986  Age: 34 y.o. MRN: [id number]  CC: Gynecologic Exam   HPI Lori Mcknight is a 34 year old female patient of Bertram Denver, FNP with a medical history significant for abnormal uterine bleed who presents today for a Pap smear. Seen by GYN in 02/2020, pelvic and transvaginal ultrasound ordered which revealed no uterine or ovarian abnormality, moderate to large free fluid in the pelvis and patient was to return after ultrasound.  Notes also indicated endometrial biopsy as clinically indicated, possibly [place] for treatment. She follows up with GYN later this month. Past Medical History:  Diagnosis Date  . Anemia    history of anemia  . Anxiety   . Asthma    inhaler as needed  . Chest pain of uncertain etiology    intermittent left side chest pain  . Chlamydia   . Depression   . Endometriosis   . Genital herpes   . Headache    MIGRAINES  . Hypertension    no meds since April. needs new px per pt   . Hypothyroidism   . Morbid obesity (HCC)   . Obese   . Obesity, Class III, BMI 40-49.9 (morbid obesity) (HCC) 11/06/2011  . PCOS (polycystic ovarian syndrome)   . PONV (postoperative nausea and vomiting)   . Shortness of breath    on exertion from weight  . Thyroid disease   . Urinary tract infection     Past Surgical History:  Procedure Laterality Date  . DILATION AND CURETTAGE OF UTERUS    . DILATION AND CURETTAGE OF UTERUS N/A 04/29/2018   Procedure: DILATATION AND CURETTAGE;  Surgeon: Allie Bossier, MD;  Location: [facility];  Service: Gynecology;  Laterality: N/A;  . HYSTEROSCOPY WITH D & C N/A 04/06/2014   Procedure: DILATATION AND CURETTAGE /HYSTEROSCOPY ;  Surgeon: Tereso Newcomer, MD;  Location: [facility];  Service: Gynecology;  Laterality: N/A;  . LAPAROSCOPY N/A 04/29/2018   Procedure: LAPAROSCOPY DIAGNOSTIC WITH PERITONEAL BIOPSY;  Surgeon: Allie Bossier, MD;  Location: [facility];  Service: Gynecology;  Laterality: N/A;   . LEFT HEART CATH AND CORONARY ANGIOGRAPHY N/A 12/12/2016   Procedure: Left Heart Cath and Coronary Angiography;  Surgeon: Peter M Swaziland, MD;  Location: [facility] INVASIVE CV LAB;  Service: Cardiovascular;  Laterality: N/A;  . NO PAST SURGERIES    . WISDOM TOOTH EXTRACTION      Family History  Problem Relation Age of Onset  . Heart disease Mother   . Sleep apnea Mother   . Depression Mother   . Hypertension Mother   . Kidney disease Mother   . Diabetes Father   . Heart disease Father   . Colon cancer Father        63s  . Hypertension Father   . Heart disease Maternal Grandmother   . Diabetes Maternal Grandfather   . Heart disease Maternal Grandfather   . Diabetes Sister   . Hypertension Sister     Allergies  Allergen Reactions  . Dilaudid [Hydromorphone] Rash    Itching all over body    Outpatient Medications Prior to Visit  Medication Sig Dispense Refill  . acetaminophen (TYLENOL) 325 MG tablet Take 2 tablets (650 mg total) by mouth every 6 (six) hours as needed for mild pain or fever.    Marland Kitchen albuterol (PROVENTIL HFA;VENTOLIN HFA) 108 (90 Base) MCG/ACT inhaler Inhale 2 puffs into the lungs every 6 (six) hours as needed for  wheezing or shortness of breath. 1 Inhaler 2  . cyclobenzaprine (FLEXERIL) 10 MG tablet Take 1 tablet (10 mg total) by mouth at bedtime. 30 tablet 0  . gabapentin (NEURONTIN) 300 MG capsule Take 1 capsule (300 mg total) by mouth 2 (two) times daily. 60 capsule 1  . ibuprofen (ADVIL) 200 MG tablet Take 800 mg by mouth every 6 (six) hours as needed for moderate pain.    Marland Kitchen levothyroxine (SYNTHROID) 88 MCG tablet Take 1 tablet (88 mcg total) by mouth daily before breakfast. 30 tablet 0   No facility-administered medications prior to visit.     ROS Review of Systems  Constitutional: Negative for activity change, appetite change and fatigue.  HENT: Negative for congestion, sinus pressure and sore throat.   Eyes: Negative for visual disturbance.  Respiratory:  Negative for cough, chest tightness, shortness of breath and wheezing.   Cardiovascular: Negative for chest pain and palpitations.  Gastrointestinal: Negative for abdominal distention, abdominal pain and constipation.  Endocrine: Negative for polydipsia.  Genitourinary: Negative for dysuria and frequency.  Musculoskeletal: Negative for arthralgias and back pain.  Skin: Negative for rash.  Neurological: Negative for tremors, light-headedness and numbness.  Hematological: Does not bruise/bleed easily.  Psychiatric/Behavioral: Negative for agitation and behavioral problems.    Objective:  BP 102/66   Pulse 78   Ht 5\' 6"  (1.676 m)   Wt (!) 347 lb (157.4 kg)   SpO2 100%   BMI 56.01 kg/m   BP/Weight 09/11/2020 04/01/2020 03/28/2020  Systolic BP 102 156 -  Diastolic BP 66 84 -  Wt. (Lbs) 347 - 343.04  BMI 56.01 - 55.37      Physical Exam Constitutional:      Appearance: She is well-developed.  Neck:     Vascular: No JVD.  Cardiovascular:     Rate and Rhythm: Normal rate.     Heart sounds: Normal heart sounds. No murmur heard.   Pulmonary:     Effort: Pulmonary effort is normal.     Breath sounds: Normal breath sounds. No wheezing or rales.  Chest:     Chest wall: No tenderness.  Abdominal:     General: Bowel sounds are normal. There is no distension.     Palpations: Abdomen is soft. There is no mass.     Tenderness: There is no abdominal tenderness.  Genitourinary:    Comments: External genitalia-normal, vagina, cervix, adnexa-normal Musculoskeletal:        General: Normal range of motion.     Right lower leg: No edema.     Left lower leg: No edema.  Neurological:     Mental Status: She is alert and oriented to person, place, and time.  Psychiatric:        Mood and Affect: Mood normal.     CMP Latest Ref Rng & Units 03/31/2020 03/30/2020 03/29/2020  Glucose 70 - 99 mg/dL 95 87 88  BUN 6 - 20 mg/dL 03/31/2020) <1(E) <7(N)  Creatinine 0.44 - 1.00 mg/dL <1(Z 0.01 7.49    Sodium 135 - 145 mmol/L 136 136 139  Potassium 3.5 - 5.1 mmol/L 4.7 3.4(L) 3.5  Chloride 98 - 111 mmol/L 106 105 108  CO2 22 - 32 mmol/L 26 24 25   Calcium 8.9 - 10.3 mg/dL 4.49) ) 6.7(R)  Total Protein 6.5 - 8.1 g/dL - - 7.0  Total Bilirubin 0.3 - 1.2 mg/dL - - 0.9  Alkaline Phos 38 - 126 U/L - - 50  AST 15 - 41 U/L - -  26  ALT 0 - 44 U/L - - 26    Lipid Panel     Component Value Date/Time   CHOL 199 11/12/2016 1042   TRIG 87 11/12/2016 1042   HDL 56 11/12/2016 1042   CHOLHDL 3.6 11/12/2016 1042   LDLCALC 126 (H) 11/12/2016 1042    CBC    Component Value Date/Time   WBC 4.1 03/30/2020 0507   RBC 4.44 03/30/2020 0507   HGB 13.9 03/30/2020 0507   HGB 13.7 12/09/2016 1113   HCT 43.2 03/30/2020 0507   HCT 42.2 12/09/2016 1113   PLT 169 03/30/2020 0507   PLT 236 12/09/2016 1113   MCV 97.3 03/30/2020 0507   MCV 94 12/09/2016 1113   MCH 31.3 03/30/2020 0507   MCHC 32.2 03/30/2020 0507   RDW 12.3 03/30/2020 0507   RDW 13.4 12/09/2016 1113   LYMPHSABS 2.5 03/28/2020 0528   MONOABS 0.5 03/28/2020 0528   EOSABS 0.1 03/28/2020 0528   BASOSABS 0.0 03/28/2020 0528    Lab Results  Component Value Date   HGBA1C 5.5 11/12/2016    Assessment & Plan:  1. Health care maintenance Counseled on 150 minutes of exercise per week, healthy eating (including decreased daily intake of saturated fats, cholesterol, added sugars, sodium), routine healthcare maintenance.   2. Screening for cervical cancer - Cytology - PAP([place]) - Cervicovaginal ancillary only  3. Abnormal uterine bleeding (AUB) Currently scheduled to see GYN next month  4. Screening for STD (sexually transmitted disease) - Cervicovaginal ancillary only No orders of the defined types were placed in this encounter.   Follow-up: No follow-ups on file.       Hoy Register, MD, FAAFP. [facility] [place], [place] [phone]   09/11/2020, 11:23 AM

## 2020-09-11 NOTE — Patient Instructions (Signed)
Abnormal Uterine Bleeding °Abnormal uterine bleeding means bleeding more than usual from your uterus. It can include: °· Bleeding between periods. °· Bleeding after sex. °· Bleeding that is heavier than normal. °· Periods that last longer than usual. °· Bleeding after you have stopped having your period (menopause). °There are many problems that may cause this. You should see a doctor for any kind of bleeding that is not normal. Treatment depends on the cause of the bleeding. °Follow these instructions at home: °· Watch your condition for any changes. °· Do not use tampons, douche, or have sex, if your doctor tells you not to. °· Change your pads often. °· Get regular well-woman exams. Make sure they include a pelvic exam and cervical cancer screening. °· Keep all follow-up visits as told by your doctor. This is important. °Contact a doctor if: °· The bleeding lasts more than one week. °· You feel dizzy at times. °· You feel like you are going to throw up (nauseous). °· You throw up. °Get help right away if: °· You pass out. °· You have to change pads every hour. °· You have belly (abdominal) pain. °· You have a fever. °· You get sweaty. °· You get weak. °· You passing large blood clots from your vagina. °Summary °· Abnormal uterine bleeding means bleeding more than usual from your uterus. °· There are many problems that may cause this. You should see a doctor for any kind of bleeding that is not normal. °· Treatment depends on the cause of the bleeding. °This information is not intended to replace advice given to you by your health care provider. Make sure you discuss any questions you have with your health care provider. °Document Revised: 09/23/2016 Document Reviewed: 09/23/2016 °Elsevier Patient Education © 2020 Else[employer] Inc. ° °

## 2020-09-11 NOTE — Progress Notes (Signed)
States that she has been having irregular cycles.

## 2020-09-12 LAB — CERVICOVAGINAL ANCILLARY ONLY
Bacterial Vaginitis (gardnerella): POSITIVE — AB
Candida Glabrata: NEGATIVE
Candida Vaginitis: NEGATIVE
Chlamydia: NEGATIVE
Comment: NEGATIVE
Comment: NEGATIVE
Comment: NEGATIVE
Comment: NEGATIVE
Comment: NEGATIVE
Comment: NORMAL
Neisseria Gonorrhea: NEGATIVE
Trichomonas: NEGATIVE

## 2020-09-13 LAB — CYTOLOGY - PAP
Comment: NEGATIVE
Diagnosis: NEGATIVE
High risk HPV: NEGATIVE

## 2020-09-14 ENCOUNTER — Encounter: Payer: Self-pay | Admitting: Family Medicine

## 2020-09-14 ENCOUNTER — Other Ambulatory Visit: Payer: Self-pay | Admitting: Family Medicine

## 2020-09-14 MED ORDER — METRONIDAZOLE 500 MG PO TABS: 500.0000 mg | ORAL_TABLET | Freq: Two times a day (BID) | ORAL | 0 refills | Status: DC

## 2020-09-14 MED FILL — metroNIDAZOLE 500 MG TABS: 500 | 7 days supply | Qty: 14 | Fill #0

## 2020-09-18 ENCOUNTER — Ambulatory Visit: Payer: Self-pay | Admitting: Nurse Practitioner

## 2020-11-19 ENCOUNTER — Other Ambulatory Visit: Payer: Self-pay

## 2020-11-19 ENCOUNTER — Encounter: Payer: Self-pay | Admitting: Obstetrics & Gynecology

## 2020-11-19 ENCOUNTER — Ambulatory Visit (INDEPENDENT_AMBULATORY_CARE_PROVIDER_SITE_OTHER): Payer: Self-pay | Admitting: Obstetrics & Gynecology

## 2020-11-19 VITALS — BP 127/81 | HR 90 | Ht 66.0 in | Wt 360.2 lb

## 2020-11-19 DIAGNOSIS — E039 Hypothyroidism, unspecified: Secondary | ICD-10-CM

## 2020-11-19 DIAGNOSIS — Z8719 Personal history of other diseases of the digestive system: Secondary | ICD-10-CM

## 2020-11-19 DIAGNOSIS — N926 Irregular menstruation, unspecified: Secondary | ICD-10-CM

## 2020-11-19 DIAGNOSIS — Z800 Family history of malignant neoplasm of digestive organs: Secondary | ICD-10-CM

## 2020-11-19 DIAGNOSIS — N809 Endometriosis, unspecified: Secondary | ICD-10-CM

## 2020-11-19 LAB — POCT PREGNANCY, URINE: Preg Test, Ur: NEGATIVE

## 2020-11-19 MED ORDER — MEDROXYPROGESTERONE ACETATE 10 MG PO TABS: 10.0000 mg | ORAL_TABLET | Freq: Every day | ORAL | 0 refills | Status: DC

## 2020-11-19 MED ORDER — LEVOTHYROXINE SODIUM 88 MCG PO TABS: 88.0000 ug | ORAL_TABLET | Freq: Every day | ORAL | 3 refills | Status: DC

## 2020-11-19 NOTE — Progress Notes (Signed)
GYNECOLOGY  VISIT  CC:   Irregular bleeding  HPI: 35 y.o. [id number] Single Black or [place] American female here for complaint of irregular bleeding that has been going on for the last year.  She typically bleeds for 5-6 days when she has a cycle.  Flow is heavier on days She typically skips about every other month and cycles have been between every 3 weeks - 8 weeks.  Pt reports cycles are typically normal and lasts 5-6 days.  2 - 3 days are heavy.  When it is heavy, she needs to change maxi pads every 1.5 - 2 hours  She does not pass clots.  Pt reports last cycle 09/17/20.    She had a laparoscopy in 2019 with Dr. Marice Potter and there stage I endometriosis.    She has hx of diverticulosis and has been seen in the ER several times for this.  The last visit to the ER was in June but she has been treated over the phone by Dr. Michaell Cowing in about October.  She's been treated with antibiotics for this at least 5 times.  CT was done in June with ER visit.  Uterus and ovaries were normal on CT.  She reports she is actually having bright red rectal bleeding with passing some clots in her stool.  This has been present for about three weeks.    She has hypothyroidism and is off her thyroid medication.  She needs refill for this.  Last TSH was in 2/21.  Medication was increased to .  Pt reports her cycles are more regular when she is on thyroid medication.    Last pap was 09/11/2020.  This was negative with negative HR HPV.  Has appt with [facility].    GYNECOLOGIC HISTORY: LMP:  09/17/2020  Patient Active Problem List   Diagnosis Date Noted  . Diverticulitis 03/26/2020  . Obesity, morbid, BMI 50 or higher (HCC) 08/08/2019  . Endometriosis s/p ablation 08/08/2019  . Dysuria 08/08/2019  . Family history of colon cancer in father 08/08/2019  . Acute diverticulitis 08/07/2019  . Incomplete bladder emptying 02/18/2018  . DUB (dysfunctional uterine bleeding) 02/18/2018  . Anemia 02/18/2018  .  Hypothyroid 01/29/2017  . Migraines 08/26/2016  . Chest pain 02/04/2016  . Abnormal uterine bleeding (AUB) 01/09/2014  . Thickened endometrium 12/02/2013  . Essential hypertension 10/21/2013  . PCOS (polycystic ovarian syndrome) 11/06/2011  . Infertility, female 11/06/2011  . Pelvic pain in female 08/01/2011    Past Medical History:  Diagnosis Date  . Anemia    history of anemia  . Anxiety   . Asthma    inhaler as needed  . Chest pain of uncertain etiology    intermittent left side chest pain  . Chlamydia   . Depression   . Diverticulitis 2019  . Endometriosis   . Genital herpes   . Headache    MIGRAINES  . Hypertension    no meds since April  . Hypothyroidism   . Obese   . Obesity, Class III, BMI 40-49.9 (morbid obesity) (HCC) 11/06/2011  . PCOS (polycystic ovarian syndrome)   . PONV (postoperative nausea and vomiting)   . Shortness of breath    on exertion from weight    Past Surgical History:  Procedure Laterality Date  . DILATION AND CURETTAGE OF UTERUS N/A 04/29/2018   Procedure: DILATATION AND CURETTAGE;  Surgeon: Allie Bossier, MD;  Location: [facility];  Service: Gynecology;  Laterality: N/A;  . HYSTEROSCOPY WITH D & C  N/A 04/06/2014   Procedure: DILATATION AND CURETTAGE /HYSTEROSCOPY ;  Surgeon: Tereso Newcomer, MD;  Location: [facility];  Service: Gynecology;  Laterality: N/A;  . LAPAROSCOPY N/A 04/29/2018   Procedure: LAPAROSCOPY DIAGNOSTIC WITH PERITONEAL BIOPSY;  Surgeon: Allie Bossier, MD;  Location: [facility];  Service: Gynecology;  Laterality: N/A;  . LEFT HEART CATH AND CORONARY ANGIOGRAPHY N/A 12/12/2016   Procedure: Left Heart Cath and Coronary Angiography;  Surgeon: Peter M Swaziland, MD;  Location: [facility] INVASIVE CV LAB;  Service: Cardiovascular;  Laterality: N/A;  . WISDOM TOOTH EXTRACTION      MEDS:   Current Outpatient Medications on File Prior to Visit  Medication Sig Dispense Refill  . acetaminophen (TYLENOL) 325 MG tablet Take 2 tablets (650 mg total) by mouth  every 6 (six) hours as needed for mild pain or fever.    Marland Kitchen albuterol (PROVENTIL HFA;VENTOLIN HFA) 108 (90 Base) MCG/ACT inhaler Inhale 2 puffs into the lungs every 6 (six) hours as needed for wheezing or shortness of breath. 1 Inhaler 2  . ibuprofen (ADVIL) 200 MG tablet Take 800 mg by mouth every 6 (six) hours as needed for moderate pain.    . cyclobenzaprine (FLEXERIL) 10 MG tablet Take 1 tablet (10 mg total) by mouth at bedtime. (Patient not taking: Reported on 11/19/2020) 30 tablet 0  . gabapentin (NEURONTIN) 300 MG capsule Take 1 capsule (300 mg total) by mouth 2 (two) times daily. (Patient not taking: Reported on 11/19/2020) 60 capsule 1  . levothyroxine (SYNTHROID) 88 MCG tablet Take 1 tablet (88 mcg total) by mouth daily before breakfast. (Patient not taking: Reported on 11/19/2020) 30 tablet 0   No current facility-administered medications on file prior to visit.    ALLERGIES: Dilaudid [hydromorphone]  Family History  Problem Relation Age of Onset  . Heart disease Mother   . Sleep apnea Mother   . Depression Mother   . Hypertension Mother   . Kidney disease Mother   . Diabetes Father   . Heart disease Father   . Colon cancer Father        67s  . Hypertension Father   . Heart disease Maternal Grandmother   . Diabetes Maternal Grandfather   . Heart disease Maternal Grandfather   . Diabetes Sister   . Hypertension Sister   . Colon cancer Cousin        died age 90, paternal cousin    SH:  Single, smoker (last smoking was 3 days ago)  Review of Systems  Constitutional: Negative.   Gastrointestinal: Negative.   Genitourinary: Negative.   Psychiatric/Behavioral: Negative.     PHYSICAL EXAMINATION:    BP 127/81   Pulse 90   Ht 5\' 6"  (1.676 m)   Wt (!) 360 lb 3.2 oz (163.4 kg)   LMP  (LMP Unknown)   BMI 58.14 kg/m     General appearance: alert, cooperative and appears stated age No exam performed today  Assessment/Plan: 1. Irregular bleeding - pt does not want to be  on any medication that is a contraceptive.  Will give provera challenged 10mg  x 10 days to start cycle.  Recommend this every 6-8 weeks to help keep cycles occurring every 1-2 months.  Hopefully restarting thyroid medication will help with cycle regularity as well.  2. Hypothyroidism, unspecified type - levothyroxine (SYNTHROID) 88 MCG tablet; Take 1 tablet (88 mcg total) by mouth daily before breakfast.  Dispense: 30 tablet; Refill: 3  3. Obesity, morbid, BMI 50 or higher (HCC)  4. Endometriosis - pt declines any treatment as this will interfere with pregnancy desires -has appt with Dr. April Manson  5. Family history of colon cancer in father - feel really strongly that pt needs colonoscopy.  Paperwork for financial assistance given today  6. History of diverticulitis  35 minutes of total time was spent for this patient encounter, including preparation, face-to-face counseling with the patient and coordination of care, and documentation of the encounter.

## 2020-12-05 ENCOUNTER — Telehealth: Payer: Self-pay | Admitting: *Deleted

## 2020-12-05 ENCOUNTER — Encounter: Payer: Self-pay | Admitting: *Deleted

## 2020-12-05 DIAGNOSIS — N926 Irregular menstruation, unspecified: Secondary | ICD-10-CM

## 2020-12-05 DIAGNOSIS — N809 Endometriosis, unspecified: Secondary | ICD-10-CM

## 2020-12-05 DIAGNOSIS — N979 Female infertility, unspecified: Secondary | ICD-10-CM

## 2020-12-05 NOTE — Telephone Encounter (Signed)
Pt left VM message stating that she had spoken with someone last week regarding getting a referral to [facility]. She requests a call back. I returned pt's call and discussed her concern. I advised that the note from Dr. Hyacinth Meeker on 2/7 stated that she already had an appointment with Dr. April Manson. Pt said that they needed to wait until she had a period and also that they needed a referral order. Per chart review, pt was prescribed Provera and Levothyroxine on 2/7 however has not yet picked them up yet due to lack of finances. She stated that she will be obtaining the medications on Friday, 2/25. I advised pt that it is imperative that she take these medications as prescribed. She also needs to schedule appointment with her endocrinologist - Dr. Meredeth Ide for appropriate follow up. Pt also asked if she should stop taking the Provera if she gets her period prior to the end of the 10 days. I told pt that I will send an answer via Mychart message after speaking eith the doctor to confirm the response. I will place the referral order as requested. Pt voiced understanding of all information ans instructions given.

## 2021-02-27 ENCOUNTER — Ambulatory Visit: Payer: Self-pay

## 2021-03-20 ENCOUNTER — Ambulatory Visit: Payer: Self-pay

## 2021-04-12 ENCOUNTER — Ambulatory Visit: Payer: Self-pay | Admitting: Nurse Practitioner

## 2021-04-23 ENCOUNTER — Other Ambulatory Visit: Payer: Self-pay

## 2021-04-23 ENCOUNTER — Ambulatory Visit: Payer: Self-pay | Attending: Nurse Practitioner

## 2021-05-04 ENCOUNTER — Other Ambulatory Visit: Payer: Self-pay

## 2021-05-04 ENCOUNTER — Other Ambulatory Visit: Payer: Self-pay | Admitting: Family

## 2021-05-04 DIAGNOSIS — M5442 Lumbago with sciatica, left side: Secondary | ICD-10-CM

## 2021-05-04 DIAGNOSIS — E039 Hypothyroidism, unspecified: Secondary | ICD-10-CM

## 2021-05-04 DIAGNOSIS — N926 Irregular menstruation, unspecified: Secondary | ICD-10-CM

## 2021-05-04 DIAGNOSIS — G8929 Other chronic pain: Secondary | ICD-10-CM

## 2021-05-05 ENCOUNTER — Other Ambulatory Visit: Payer: Self-pay

## 2021-05-05 DIAGNOSIS — N926 Irregular menstruation, unspecified: Secondary | ICD-10-CM

## 2021-05-06 ENCOUNTER — Other Ambulatory Visit: Payer: Self-pay

## 2021-05-06 ENCOUNTER — Ambulatory Visit: Payer: Self-pay | Attending: Nurse Practitioner

## 2021-05-06 MED ORDER — CYCLOBENZAPRINE HCL 10 MG PO TABS
10.0000 mg | ORAL_TABLET | Freq: Every day | ORAL | 0 refills | Status: DC
  Filled 2021-05-06: qty 30, 30d supply, fill #0

## 2021-05-07 ENCOUNTER — Encounter: Payer: Self-pay | Admitting: *Deleted

## 2021-05-08 ENCOUNTER — Emergency Department (HOSPITAL_COMMUNITY)
Admission: EM | Admit: 2021-05-08 | Discharge: 2021-05-09 | Discharge status: Against Medical Advice | Payer: Self-pay | Attending: Student | Admitting: Student

## 2021-05-08 ENCOUNTER — Other Ambulatory Visit: Payer: Self-pay

## 2021-05-08 ENCOUNTER — Encounter (HOSPITAL_COMMUNITY): Payer: Self-pay | Admitting: *Deleted

## 2021-05-08 DIAGNOSIS — R197 Diarrhea, unspecified: Secondary | ICD-10-CM | POA: Insufficient documentation

## 2021-05-08 DIAGNOSIS — Z8742 Personal history of other diseases of the female genital tract: Secondary | ICD-10-CM | POA: Insufficient documentation

## 2021-05-08 DIAGNOSIS — R110 Nausea: Secondary | ICD-10-CM | POA: Insufficient documentation

## 2021-05-08 DIAGNOSIS — R3989 Other symptoms and signs involving the genitourinary system: Secondary | ICD-10-CM | POA: Insufficient documentation

## 2021-05-08 DIAGNOSIS — K5900 Constipation, unspecified: Secondary | ICD-10-CM | POA: Insufficient documentation

## 2021-05-08 DIAGNOSIS — Z5321 Procedure and treatment not carried out due to patient leaving prior to being seen by health care provider: Secondary | ICD-10-CM | POA: Insufficient documentation

## 2021-05-08 DIAGNOSIS — R1032 Left lower quadrant pain: Secondary | ICD-10-CM | POA: Insufficient documentation

## 2021-05-08 DIAGNOSIS — Z8719 Personal history of other diseases of the digestive system: Secondary | ICD-10-CM | POA: Insufficient documentation

## 2021-05-08 LAB — COMPREHENSIVE METABOLIC PANEL
ALT: 27 U/L (ref 0–44)
AST: 30 U/L (ref 15–41)
Albumin: 3.8 g/dL (ref 3.5–5.0)
Alkaline Phosphatase: 67 U/L (ref 38–126)
Anion gap: 8 (ref 5–15)
BUN: 5 mg/dL — ABNORMAL LOW (ref 6–20)
CO2: 22 mmol/L (ref 22–32)
Calcium: 8.9 mg/dL (ref 8.9–10.3)
Chloride: 104 mmol/L (ref 98–111)
Creatinine, Ser: 0.85 mg/dL (ref 0.44–1.00)
GFR, Estimated: >60 mL/min (ref >60)
Glucose, Bld: 99 mg/dL (ref 70–99)
Potassium: 3.3 mmol/L — ABNORMAL LOW (ref 3.5–5.1)
Sodium: 134 mmol/L — ABNORMAL LOW (ref 135–145)
Total Bilirubin: 0.2 mg/dL — ABNORMAL LOW (ref 0.3–1.2)
Total Protein: 7.6 g/dL (ref 6.5–8.1)

## 2021-05-08 LAB — CBC WITH DIFFERENTIAL/PLATELET
Abs Immature Granulocytes: 0.02 10*3/uL (ref 0.00–0.07)
Basophils Absolute: 0 10*3/uL (ref 0.0–0.1)
Basophils Relative: 0 %
Eosinophils Absolute: 0.1 10*3/uL (ref 0.0–0.5)
Eosinophils Relative: 1 %
HCT: 45.1 % (ref 36.0–46.0)
Hemoglobin: 14.6 g/dL (ref 12.0–15.0)
Immature Granulocytes: 0 %
Lymphocytes Relative: 33 %
Lymphs Abs: 2.2 10*3/uL (ref 0.7–4.0)
MCH: 31.4 pg (ref 26.0–34.0)
MCHC: 32.4 g/dL (ref 30.0–36.0)
MCV: 97 fL (ref 80.0–100.0)
Monocytes Absolute: 0.5 10*3/uL (ref 0.1–1.0)
Monocytes Relative: 8 %
Neutro Abs: 3.9 10*3/uL (ref 1.7–7.7)
Neutrophils Relative %: 58 %
Platelets: 230 10*3/uL (ref 150–400)
RBC: 4.65 MIL/uL (ref 3.87–5.11)
RDW: 12.8 % (ref 11.5–15.5)
WBC: 6.7 10*3/uL (ref 4.0–10.5)
nRBC: 0 % (ref 0.0–0.2)

## 2021-05-08 NOTE — ED Provider Notes (Signed)
Emergency Medicine Provider Triage Evaluation Note  Lori Mcknight , a 35 y.o. female  was evaluated in triage.  Pt complains of left lower quadrant pain/pelvic pain, going on for last 4 days, describes it as a sharp burning-like sensation, she has associated urinary urgency, frequency, difficulty urinating, she denies pelvic pain, vaginal discharge, vaginal bleeding, she notes that she has had diarrhea but is now constipation, she has a history of diverticulitis states this feels similar to this.  She is currently not on birth control, states she is not had a menstrual cycle since april, she has no history of ovarian torsion or ovarian cysts..  Review of Systems  Positive: Left lower quadrant pain, dysuria Negative: Nausea, vomiting  Physical Exam  BP (!) 154/99 (BP Location: Right Wrist)   Pulse 85   Temp 98.4 F (36.9 C) (Oral)   Resp 20   SpO2 100%  Gen:   Awake, no distress   Resp:  Normal effort  MSK:   Moves extremities without difficulty  Other:  Patient has left lower quadrant pain, no guarding, rebound tenderness, peritoneal signs.  Medical Decision Making  Medically screening exam initiated at 7:58 PM.  Appropriate orders placed.  Lori Mcknight was informed that the remainder of the evaluation will be completed by another provider, this initial triage assessment does not replace that evaluation, and the importance of remaining in the ED until their evaluation is complete.  Patient presents with left lower quadrant pain patient need further work-up.   Carroll Sage, PA-C 05/08/21 2000    Margarita Grizzle, MD 05/08/21 [phone]

## 2021-05-08 NOTE — ED Triage Notes (Signed)
Pt reports lower left abdominal pain. Hx of diverticulitis and endometriosis.  States cramping, burning and spasms when urinating. Nausea. Intermittent constipation and diarrhea. Denies fevers.

## 2021-05-09 ENCOUNTER — Inpatient Hospital Stay (HOSPITAL_BASED_OUTPATIENT_CLINIC_OR_DEPARTMENT_OTHER)
Admission: EM | Admit: 2021-05-09 | Discharge: 2021-05-15 | DRG: 392 | Disposition: A | Discharge status: Home / Self Care | Payer: Self-pay | Attending: Internal Medicine | Admitting: Internal Medicine

## 2021-05-09 ENCOUNTER — Other Ambulatory Visit: Payer: Self-pay

## 2021-05-09 ENCOUNTER — Emergency Department (HOSPITAL_BASED_OUTPATIENT_CLINIC_OR_DEPARTMENT_OTHER): Payer: Self-pay

## 2021-05-09 ENCOUNTER — Encounter (HOSPITAL_BASED_OUTPATIENT_CLINIC_OR_DEPARTMENT_OTHER): Payer: Self-pay | Admitting: *Deleted

## 2021-05-09 DIAGNOSIS — N3289 Other specified disorders of bladder: Secondary | ICD-10-CM | POA: Diagnosis not present

## 2021-05-09 DIAGNOSIS — Z818 Family history of other mental and behavioral disorders: Secondary | ICD-10-CM

## 2021-05-09 DIAGNOSIS — R188 Other ascites: Secondary | ICD-10-CM

## 2021-05-09 DIAGNOSIS — Z841 Family history of disorders of kidney and ureter: Secondary | ICD-10-CM

## 2021-05-09 DIAGNOSIS — Z8249 Family history of ischemic heart disease and other diseases of the circulatory system: Secondary | ICD-10-CM

## 2021-05-09 DIAGNOSIS — K760 Fatty (change of) liver, not elsewhere classified: Secondary | ICD-10-CM | POA: Diagnosis present

## 2021-05-09 DIAGNOSIS — Z833 Family history of diabetes mellitus: Secondary | ICD-10-CM

## 2021-05-09 DIAGNOSIS — Z885 Allergy status to narcotic agent status: Secondary | ICD-10-CM

## 2021-05-09 DIAGNOSIS — K5900 Constipation, unspecified: Secondary | ICD-10-CM | POA: Diagnosis not present

## 2021-05-09 DIAGNOSIS — Z79890 Hormone replacement therapy (postmenopausal): Secondary | ICD-10-CM

## 2021-05-09 DIAGNOSIS — Z6841 Body Mass Index (BMI) 40.0 and over, adult: Secondary | ICD-10-CM

## 2021-05-09 DIAGNOSIS — E876 Hypokalemia: Secondary | ICD-10-CM

## 2021-05-09 DIAGNOSIS — R1084 Generalized abdominal pain: Secondary | ICD-10-CM

## 2021-05-09 DIAGNOSIS — Z800 Family history of malignant neoplasm of digestive organs: Secondary | ICD-10-CM

## 2021-05-09 DIAGNOSIS — Z79899 Other long term (current) drug therapy: Secondary | ICD-10-CM

## 2021-05-09 DIAGNOSIS — E039 Hypothyroidism, unspecified: Secondary | ICD-10-CM | POA: Diagnosis present

## 2021-05-09 DIAGNOSIS — E282 Polycystic ovarian syndrome: Secondary | ICD-10-CM | POA: Diagnosis present

## 2021-05-09 DIAGNOSIS — J4520 Mild intermittent asthma, uncomplicated: Secondary | ICD-10-CM

## 2021-05-09 DIAGNOSIS — I10 Essential (primary) hypertension: Secondary | ICD-10-CM | POA: Diagnosis present

## 2021-05-09 DIAGNOSIS — F17210 Nicotine dependence, cigarettes, uncomplicated: Secondary | ICD-10-CM | POA: Diagnosis present

## 2021-05-09 DIAGNOSIS — Z20822 Contact with and (suspected) exposure to covid-19: Secondary | ICD-10-CM | POA: Diagnosis present

## 2021-05-09 DIAGNOSIS — K5720 Diverticulitis of large intestine with perforation and abscess without bleeding: Principal | ICD-10-CM

## 2021-05-09 DIAGNOSIS — N809 Endometriosis, unspecified: Secondary | ICD-10-CM | POA: Diagnosis present

## 2021-05-09 LAB — URINALYSIS, ROUTINE W REFLEX MICROSCOPIC
Bilirubin Urine: NEGATIVE
Glucose, UA: NEGATIVE mg/dL
Hgb urine dipstick: NEGATIVE
Ketones, ur: 5 mg/dL — AB
Leukocytes,Ua: NEGATIVE
Nitrite: NEGATIVE
Protein, ur: 100 mg/dL — AB
Specific Gravity, Urine: 1.03 (ref 1.005–1.030)
pH: 5 (ref 5.0–8.0)

## 2021-05-09 LAB — CBC
HCT: 45.1 % (ref 36.0–46.0)
Hemoglobin: 14.9 g/dL (ref 12.0–15.0)
MCH: 31.4 pg (ref 26.0–34.0)
MCHC: 33 g/dL (ref 30.0–36.0)
MCV: 95.1 fL (ref 80.0–100.0)
Platelets: 195 K/uL (ref 150–400)
RBC: 4.74 MIL/uL (ref 3.87–5.11)
RDW: 12.7 % (ref 11.5–15.5)
WBC: 6.6 K/uL (ref 4.0–10.5)
nRBC: 0 % (ref 0.0–0.2)

## 2021-05-09 LAB — RESP PANEL BY RT-PCR (FLU A&B, COVID) ARPGX2
Influenza A by PCR: NEGATIVE
Influenza B by PCR: NEGATIVE
SARS Coronavirus 2 by RT PCR: NEGATIVE

## 2021-05-09 LAB — PREGNANCY, URINE: Preg Test, Ur: NEGATIVE

## 2021-05-09 LAB — MAGNESIUM: Magnesium: 1.9 mg/dL (ref 1.7–2.4)

## 2021-05-09 MED ORDER — IOHEXOL 350 MG/ML SOLN
100.0000 mL | Freq: Once | INTRAVENOUS | Status: AC | PRN
  Administered 2021-05-09: 100 mL via INTRAVENOUS

## 2021-05-09 MED ORDER — CIPROFLOXACIN IN D5W 400 MG/200ML IV SOLN
400.0000 mg | Freq: Once | INTRAVENOUS | Status: AC
  Administered 2021-05-09: 400 mg via INTRAVENOUS
  Filled 2021-05-09: qty 200

## 2021-05-09 MED ORDER — MORPHINE SULFATE (PF) 2 MG/ML IV SOLN
2.0000 mg | Freq: Once | INTRAVENOUS | Status: AC
  Administered 2021-05-09: 2 mg via INTRAVENOUS
  Filled 2021-05-09: qty 1

## 2021-05-09 MED ORDER — KETOROLAC TROMETHAMINE 15 MG/ML IJ SOLN
15.0000 mg | Freq: Once | INTRAMUSCULAR | Status: AC
  Administered 2021-05-09: 15 mg via INTRAVENOUS
  Filled 2021-05-09: qty 1

## 2021-05-09 MED ORDER — METRONIDAZOLE 500 MG/100ML IV SOLN
500.0000 mg | Freq: Once | INTRAVENOUS | Status: AC
  Administered 2021-05-09: 500 mg via INTRAVENOUS
  Filled 2021-05-09: qty 100

## 2021-05-09 MED ORDER — ONDANSETRON HCL 4 MG/2ML IJ SOLN
4.0000 mg | Freq: Once | INTRAMUSCULAR | Status: AC
  Administered 2021-05-09: 4 mg via INTRAVENOUS
  Filled 2021-05-09: qty 2

## 2021-05-09 NOTE — ED Triage Notes (Signed)
Pt states she is having left sided pain with painful urination. History of diverticulitis and endometriosis. Heat helps some. Movement increases the pain. States she having trouble having bowel movement with some bright red blood noted.

## 2021-05-09 NOTE — ED Notes (Signed)
Pt left AMA °

## 2021-05-09 NOTE — ED Notes (Signed)
Report given to Glenetta Hew 5W at [facility]

## 2021-05-09 NOTE — H&P (Signed)
History and Physical    Lori C Reddick YQM:[id number] DOB: 12/12/1985 DOA: 05/09/2021  PCP: Claiborne RiggFleming, Zelda W, NP   Patient coming from:  Home via [facility]  Chief Complaint: Abdominal pain  HPI: Lori Mcknight is a 35 y.o. female with medical history significant for HTN, asthma, hypothyroidism, morbid obesity, endometriosis, hx of diverticulitis in 2020 who presents with a 5 day history of lower abdominal pain that has worsened over the last few days.  [facility] night and had blood work done while in the waiting room but the wait was very long so she went home and presented to [facility] today.  She reports that she initially thought the pain might be secondary to her endometriosis but has became more severe she stated that it felt more like her previous bout of diverticulitis.  She reports she has had chills but has not had any fever.  She has had nausea and some mild diarrhea with no blood but has not had any vomiting.  She denies any urinary frequency or dysuria.  She reports she has not had to use her albuterol inhaler in the last few months.  She denies any chest pain, cough, nasal congestion, sore throat, headache.  She denies any injury or trauma to her abdomen.  She reports that she has been out of her blood pressure medication for the past few months and just has not gone to have it refilled.  She does not member the name of the medication.  She is also been out of her levothyroxine for the last month.  ED Course: Patient been hemodynamically stable with mildly elevated blood pressure in the 140-150/90 range in the emergency room.  CT of her abdomen pelvis revealed diverticulitis with a abscess that was pushing on the dome of the bladder.  Possible fistula was noted on the CT scan.  ER physician discussed with surgery who recommended IV antibiotics and medicine admission and they will consult and see in the morning.  CBC was unremarkable.  Sodium 134,  potassium 3.3, chloride 104, bicarb 22, creatinine 0.85, BUN 5, LFTs normal.  Urine pregnancy test negative.  Urinalysis is negative.  Patient is negative for COVID-19 and negative for influenza A and B.  Hospitalist service has been asked to admit for further management  Review of Systems:  General: Reports chills. Denies fever, weight loss, night sweats.  Denies dizziness. Reports decreased appetite HENT: Denies headache, denies change in hearing, tinnitus.  Denies nasal congestion or bleeding.  Denies sore throat,  Denies difficulty swallowing Eyes: Denies blurry vision, pain in eye, drainage.  Denies discoloration of eyes. Neck: Denies pain.  Denies swelling.  Denies pain with movement. Cardiovascular: Denies chest pain, palpitations.  Denies edema.  Denies orthopnea Respiratory: Denies shortness of breath, cough.  Denies wheezing.  Denies sputum production Gastrointestinal: Reports lower abdominal pain. Reports nausea, diarrhea. Denies vomiting. Denies melena.  Denies hematemesis. Musculoskeletal: Denies limitation of movement.  Denies deformity or swelling. Denies arthralgias or myalgias. Genitourinary: Reports pelvic pain.  Denies urinary frequency or hesitancy.  Denies dysuria.  Skin: Denies rash.  Denies petechiae, purpura, ecchymosis. Neurological: Denies headache. Denies syncope. Denies seizure activity. Denies paresthesia. Denies slurred speech, Denies visual change. Psychiatric: Denies depression, anxiety. Denies hallucinations.  Past Medical History:  Diagnosis Date   Anemia    history of anemia   Anxiety    Asthma    inhaler as needed   Chest pain of uncertain  etiology    intermittent left side chest pain   Chlamydia    Depression    Diverticulitis 2019   Endometriosis    Genital herpes    Headache    MIGRAINES   Hypertension    no meds since April   Hypothyroidism    Obese    Obesity, Class III, BMI 40-49.9 (morbid obesity) (HCC) 11/06/2011   PCOS (polycystic  ovarian syndrome)    PONV (postoperative nausea and vomiting)    Shortness of breath    on exertion from weight    Past Surgical History:  Procedure Laterality Date   DILATION AND CURETTAGE OF UTERUS N/A 04/29/2018   Procedure: DILATATION AND CURETTAGE;  Surgeon: Allie Bossier, MD;  Location: [facility];  Service: Gynecology;  Laterality: N/A;   HYSTEROSCOPY WITH D & C N/A 04/06/2014   Procedure: DILATATION AND CURETTAGE /HYSTEROSCOPY ;  Surgeon: Tereso Newcomer, MD;  Location: [facility];  Service: Gynecology;  Laterality: N/A;   LAPAROSCOPY N/A 04/29/2018   Procedure: LAPAROSCOPY DIAGNOSTIC WITH PERITONEAL BIOPSY;  Surgeon: Allie Bossier, MD;  Location: [facility];  Service: Gynecology;  Laterality: N/A;   LEFT HEART CATH AND CORONARY ANGIOGRAPHY N/A 12/12/2016   Procedure: Left Heart Cath and Coronary Angiography;  Surgeon: Peter M Swaziland, MD;  Location: [facility] INVASIVE CV LAB;  Service: Cardiovascular;  Laterality: N/A;   WISDOM TOOTH EXTRACTION      Social History  reports that she has been smoking cigarettes. She has been smoking an average of .25 packs per day. She has never used smokeless tobacco. She reports previous alcohol use. She reports previous drug use. Drug: Marijuana.  Allergies  Allergen Reactions   Dilaudid [Hydromorphone] Rash    Itching all over body    Family History  Problem Relation Age of Onset   Heart disease Mother    Sleep apnea Mother    Depression Mother    Hypertension Mother    Kidney disease Mother    Diabetes Father    Heart disease Father    Colon cancer Father        44s   Hypertension Father    Heart disease Maternal Grandmother    Diabetes Maternal Grandfather    Heart disease Maternal Grandfather    Diabetes Sister    Hypertension Sister    Colon cancer Cousin        died age 33, paternal cousin     Prior to Admission medications   Medication Sig Start Date End Date Taking? Authorizing Provider  acetaminophen (TYLENOL) 325 MG tablet Take 2 tablets  (650 mg total) by mouth every 6 (six) hours as needed for mild pain or fever. 04/01/20   Lonia Blood, MD  albuterol (PROVENTIL HFA;VENTOLIN HFA) 108 (90 Base) MCG/ACT inhaler Inhale 2 puffs into the lungs every 6 (six) hours as needed for wheezing or shortness of breath. 01/19/18   Claiborne Rigg, NP  cyclobenzaprine (FLEXERIL) 10 MG tablet Take 1 tablet (10 mg total) by mouth at bedtime. 05/06/21   Claiborne Rigg, NP  gabapentin (NEURONTIN) 300 MG capsule Take 1 capsule (300 mg total) by mouth 2 (two) times daily. Patient not taking: Reported on 11/19/2020 08/28/20   Rema Fendt, NP  ibuprofen (ADVIL) 200 MG tablet Take 800 mg by mouth every 6 (six) hours as needed for moderate pain.    [provider]  levothyroxine (SYNTHROID) 88 MCG tablet Take 1 tablet (88 mcg total) by mouth daily before breakfast. 11/19/20  03/19/21  Jerene Bears, MD  medroxyPROGESTERone (PROVERA) 10 MG tablet Take 1 tablet (10 mg total) by mouth daily. 11/19/20   Jerene Bears, MD    Physical Exam: Vitals:   05/09/21 1519 05/09/21 1615 05/09/21 1740 05/09/21 2118  BP: (!) 148/93 (!) 146/88 (!) 156/95 (!) 144/91  Pulse: (!) 103 84 88 83  Resp: 20 20 20 20   Temp:    98.1 F (36.7 C)  TempSrc:    Oral  SpO2: 100% 98% 99% 99%  Weight:      Height:        Constitutional: NAD, calm, comfortable Vitals:   05/09/21 1519 05/09/21 1615 05/09/21 1740 05/09/21 2118  BP: (!) 148/93 (!) 146/88 (!) 156/95 (!) 144/91  Pulse: (!) 103 84 88 83  Resp: 20 20 20 20   Temp:    98.1 F (36.7 C)  TempSrc:    Oral  SpO2: 100% 98% 99% 99%  Weight:      Height:       General: WDWN, Alert and oriented x3.  Eyes: EOMI, PERRL, conjunctivae normal.  Sclera nonicteric HENT:  [place]/AT, external ears normal.  Nares patent without epistasis.  Mucous membranes are moist.  Neck: Soft, normal range of motion, supple, no masses, Trachea midline Respiratory: clear to auscultation bilaterally, no wheezing, no crackles. Normal  respiratory effort. No accessory muscle use.  Cardiovascular: Regular rate and rhythm, no murmurs / rubs / gallops. No extremity edema. Abdomen: Soft, diffuse lower abdominal tenderness most pronounced in LLQ and suprapubic region, nondistended, no rebound or guarding.  No masses palpated. Morbid obesity. Bowel sounds hypoactive Musculoskeletal: FROM. no cyanosis. No joint deformity upper and lower extremities. Normal muscle tone.  Skin: Warm, dry, intact no rashes, lesions, ulcers. No induration Neurologic: CN 2-12 grossly intact.  Normal speech.  Sensation intact Psychiatric: Normal judgment and insight.  Normal mood.    Labs on Admission: I have personally reviewed following labs and imaging studies  CBC: Recent Labs  Lab 05/08/21 2002 05/09/21 1608  WBC 6.7 6.6  NEUTROABS 3.9  --   HGB 14.6 14.9  HCT 45.1 45.1  MCV 97.0 95.1  PLT 230 195    Basic Metabolic Panel: Recent Labs  Lab 05/08/21 2002  NA 134*  K 3.3*  CL 104  CO2 22  GLUCOSE 99  BUN 5*  CREATININE 0.85  CALCIUM 8.9    GFR: Estimated Creatinine Clearance: 149.8 mL/min (by C-G formula based on SCr of 0.85 mg/dL).  Liver Function Tests: Recent Labs  Lab 05/08/21 2002  AST 30  ALT 27  ALKPHOS 67  BILITOT 0.2*  PROT 7.6  ALBUMIN 3.8    Urine analysis:    Component Value Date/Time   COLORURINE AMBER (A) 05/08/2021 1958   APPEARANCEUR HAZY (A) 05/08/2021 1958   APPEARANCEUR Clear 03/08/2020 1515   LABSPEC 1.030 05/08/2021 1958   PHURINE 5.0 05/08/2021 1958   GLUCOSEU NEGATIVE 05/08/2021 1958   HGBUR NEGATIVE 05/08/2021 1958   BILIRUBINUR NEGATIVE 05/08/2021 1958   BILIRUBINUR Negative 03/08/2020 1515   KETONESUR 5 (A) 05/08/2021 1958   PROTEINUR 100 (A) 05/08/2021 1958   UROBILINOGEN 0.2 07/29/2019 1321   NITRITE NEGATIVE 05/08/2021 1958   LEUKOCYTESUR NEGATIVE 05/08/2021 1958    Radiological Exams on Admission: CT Abdomen Pelvis W Contrast  Result Date: 05/09/2021 CLINICAL DATA:   35 year old female with concern for acute diverticulitis. EXAM: CT ABDOMEN AND PELVIS WITH CONTRAST TECHNIQUE: Multidetector CT imaging of the abdomen and pelvis was performed using  the standard protocol following bolus administration of intravenous contrast. CONTRAST:  OMNIPAQUE IOHEXOL 350 MG/ML SOLN COMPARISON:  CT abdomen pelvis dated 04/10/2020. FINDINGS: Lower chest: The visualized lung bases are clear. No intra-abdominal free air. Small free fluid within the pelvis. Hepatobiliary: Fatty liver. No intrahepatic biliary ductal dilatation. The gallbladder is unremarkable. Pancreas: Unremarkable. No pancreatic ductal dilatation or surrounding inflammatory changes. Spleen: Normal in size without focal abnormality. Adrenals/Urinary Tract: The adrenal glands are unremarkable. The kidneys, and the visualized ureters appear unremarkable. The urinary bladder is minimally distended. There is thickened appearance of the bladder wall with perivesical stranding likely reactive to inflammatory changes of the sigmoid colon. There is a 2.7 x 1.8 cm loculated fluid protruding to the left bladder dome in similar location as the prior CT. This likely represents a developing abscess of the sigmoid diverticular disease. Developing collection in similar location suggests possible fistulous tract formation. There is loss of fat plane between the left bladder dome and sigmoid colon suggestive of adhesion. No air however is identified within the bladder to suggest a definite complete colovesical fistula at this time. Stomach/Bowel: There is sigmoid diverticulitis. Small fluid collection as described above protruding into the left bladder dome most consistent with developing diverticular abscess. There is no bowel obstruction. The appendix is normal. Vascular/Lymphatic: The abdominal aorta and IVC unremarkable. No portal venous gas. There is no adenopathy. Reproductive: The uterus is grossly unremarkable. No adnexal masses. Other:  None Musculoskeletal: Degenerative changes primarily at L5-S1. No acute osseous pathology. IMPRESSION: 1. Sigmoid diverticulitis with a developing diverticular abscess protruding into the left bladder dome. Findings suggestive of colovesical adhesion with possible developing fistulous tract. No air is identified within the bladder to suggest a definite complete colovesical fistula at this time. 2. Fatty liver. 3. No bowel obstruction. Normal appendix. Electronically Signed   By: Elgie Collard M.D.   On: 05/09/2021 16:51    Assessment/Plan Principal Problem:   Diverticulitis of large intestine with abscess Ms. Gurganus is admitted to Med/Surg floor. She was given flagyl and cipro in the ER.  Given Zosyn for antibiotic coverage IVF hydration with LR at 125 ml/hr overnight.  Given dose of toradol for pain now. Morphine for severe breakthru pain overnight if needed.  Surgery consulted by ER and will see pt in am for further evaluation with abscess being present.   Active Problems:   Essential hypertension Started on Losartan. Monitor BP. Pt states she has been out of her home medication for a while and not sure of what medication it was.     Hypothyroid Reports being out of her levothyroxine for past month.  Check TSH level Resume synthroid at previous dose and will need TSH rechecked in 8-12 weeks as outpatient.     Generalized abdominal pain Due to diverticulitis and abscess. Pain control ordered as above    Hypokalemia Check Magnesium level and will replace if low.  Oral potassium supplement provided.     Asthma, mild intermittent Albuterol MDI as needed for SOB, wheezing and cough.     Obesity, morbid, BMI 50 or higher (HCC) Chronic. Follow up with PCP for dietary, lifestyle, pharmacotherapy interventions for weight loss possible referral to bariatric surgery for evaluation    DVT prophylaxis: Padua score low. TED hose and early ambulation for DVT prophylaxis.   Code Status:    Full code  Family Communication:  Diagnosis and plan discussed with patient, she verbalizes understanding and agrees with plan.  Questions answered.  Further recommendations to  follow as clinical indicated Disposition Plan:   Patient is from:  Home via [facility]  Anticipated DC to:  Home  Anticipated DC date:  Anticipate 2 midnight or more stay in the hospital  Anticipated DC barriers: No barriers to discharge identified at this time  Consults called:  Surgery  Admission status:  Inpatient  Claudean Severance Stevan Eberwein MD [facility]  How to contact the [facility] Attending or Consulting provider 7A - 7P or covering provider during after hours 7P -7A, for this patient?   Check the care team in [facility] and look for a) attending/consulting [facility] provider listed and b) the [facility] team listed Log into [email or web address] and use [place]'s universal password to access. If you do not have the password, please contact the hospital operator. Locate the [facility] provider you are looking for under [facility] and page to a number that you can be directly reached. If you still have difficulty reaching the provider, please page the [facility] (Director on Call) for the Hospitalists listed on amion for assistance.  05/09/2021, 9:43 PM

## 2021-05-09 NOTE — ED Notes (Signed)
Patient transported to CT 

## 2021-05-09 NOTE — ED Notes (Signed)
Attempt report to floor RN ?

## 2021-05-09 NOTE — ED Notes (Signed)
Called [facility] to transport patient to [facility] 5W room 24

## 2021-05-09 NOTE — ED Provider Notes (Signed)
[facility] EMERGENCY DEPT Provider Note   CSN: [id number] Arrival date & time: 05/09/21  1202     [facility]  Patient presents with   Abdominal Pain   Urinary Pain    Lori Mcknight is a 35 y.o. female.  Patient with complaint of abdominal pain for 5 days.  Mostly lower quadrants but now radiating into the upper quadrant part of the abdomen.  [facility] evening had screening exam and labs done.  But the wait was too long.  Patient went home to return here today.  Patient's primary care doctor is [facility].  Patient states she is concerned that she is having diverticulitis again she has had a history of that.  Labs from last evening showed no leukocytosis no significant liver function test abnormalities.  Urinalysis negative pregnancy test negative.  Patient states that she has had some blood in her bowel movements.  But last bowel movement was on Tuesday.  Symptoms associated with nausea but no vomiting.      Past Medical History:  Diagnosis Date   Anemia    history of anemia   Anxiety    Asthma    inhaler as needed   Chest pain of uncertain etiology    intermittent left side chest pain   Chlamydia    Depression    Diverticulitis 2019   Endometriosis    Genital herpes    Headache    MIGRAINES   Hypertension    no meds since April   Hypothyroidism    Obese    Obesity, Class III, BMI 40-49.9 (morbid obesity) (HCC) 11/06/2011   PCOS (polycystic ovarian syndrome)    PONV (postoperative nausea and vomiting)    Shortness of breath    on exertion from weight    Patient Active Problem List   Diagnosis Date Noted   Diverticulitis 03/26/2020   Obesity, morbid, BMI 50 or higher (HCC) 08/08/2019   Endometriosis s/p ablation 08/08/2019   Dysuria 08/08/2019   Family history of colon cancer in father 08/08/2019   Acute diverticulitis 08/07/2019   Incomplete bladder emptying 02/18/2018   DUB  (dysfunctional uterine bleeding) 02/18/2018   Anemia 02/18/2018   Hypothyroid 01/29/2017   Migraines 08/26/2016   Chest pain 02/04/2016   Abnormal uterine bleeding (AUB) 01/09/2014   Thickened endometrium 12/02/2013   Essential hypertension 10/21/2013   PCOS (polycystic ovarian syndrome) 11/06/2011   Infertility, female 11/06/2011   Pelvic pain in female 08/01/2011    Past Surgical History:  Procedure Laterality Date   DILATION AND CURETTAGE OF UTERUS N/A 04/29/2018   Procedure: DILATATION AND CURETTAGE;  Surgeon: Allie Bossierove, Myra C, MD;  Location: [facility];  Service: Gynecology;  Laterality: N/A;   HYSTEROSCOPY WITH D & C N/A 04/06/2014   Procedure: DILATATION AND CURETTAGE /HYSTEROSCOPY ;  Surgeon: Tereso NewcomerUgonna A Anyanwu, MD;  Location: [facility];  Service: Gynecology;  Laterality: N/A;   LAPAROSCOPY N/A 04/29/2018   Procedure: LAPAROSCOPY DIAGNOSTIC WITH PERITONEAL BIOPSY;  Surgeon: Allie Bossierove, Myra C, MD;  Location: [facility];  Service: Gynecology;  Laterality: N/A;   LEFT HEART CATH AND CORONARY ANGIOGRAPHY N/A 12/12/2016   Procedure: Left Heart Cath and Coronary Angiography;  Surgeon: Peter M SwazilandJordan, MD;  [facility];  Service: Cardiovascular;  Laterality: N/A;   WISDOM TOOTH EXTRACTION       OB History     Gravida  0   Para  0   Term  0  Preterm  0   AB  0   Living  0      SAB  0   IAB  0   Ectopic  0   Multiple  0   Live Births  0           Family History  Problem Relation Age of Onset   Heart disease Mother    Sleep apnea Mother    Depression Mother    Hypertension Mother    Kidney disease Mother    Diabetes Father    Heart disease Father    Colon cancer Father        52s   Hypertension Father    Heart disease Maternal Grandmother    Diabetes Maternal Grandfather    Heart disease Maternal Grandfather    Diabetes Sister    Hypertension Sister    Colon cancer Cousin        died age 20, paternal cousin    Social History   Tobacco Use   Smoking  status: Some Days    Packs/day: 0.25    Types: Cigarettes   Smokeless tobacco: Never   Tobacco comments:    2-3 cigerettes a week.  Vaping Use   Vaping Use: Never used  Substance Use Topics   Alcohol use: Not Currently    Alcohol/week: 0.0 standard drinks    Comment: occasonal    Drug use: Not Currently    Types: Marijuana    Comment: last use 3 months ago    Home Medications Prior to Admission medications   Medication Sig Start Date End Date Taking? Authorizing Provider  acetaminophen (TYLENOL) 325 MG tablet Take 2 tablets (650 mg total) by mouth every 6 (six) hours as needed for mild pain or fever. 04/01/20   Lonia Blood, MD  albuterol (PROVENTIL HFA;VENTOLIN HFA) 108 (90 Base) MCG/ACT inhaler Inhale 2 puffs into the lungs every 6 (six) hours as needed for wheezing or shortness of breath. 01/19/18   Claiborne Rigg, NP  cyclobenzaprine (FLEXERIL) 10 MG tablet Take 1 tablet (10 mg total) by mouth at bedtime. 05/06/21   Claiborne Rigg, NP  gabapentin (NEURONTIN) 300 MG capsule Take 1 capsule (300 mg total) by mouth 2 (two) times daily. Patient not taking: Reported on 11/19/2020 08/28/20   Rema Fendt, NP  ibuprofen (ADVIL) 200 MG tablet Take 800 mg by mouth every 6 (six) hours as needed for moderate pain.    [provider]  levothyroxine (SYNTHROID) 88 MCG tablet Take 1 tablet (88 mcg total) by mouth daily before breakfast. 11/19/20 03/19/21  Jerene Bears, MD  medroxyPROGESTERone (PROVERA) 10 MG tablet Take 1 tablet (10 mg total) by mouth daily. 11/19/20   Jerene Bears, MD    Allergies    Dilaudid [hydromorphone]  Review of Systems   Review of Systems  Constitutional:  Negative for chills and fever.  HENT:  Negative for ear pain and sore throat.   Eyes:  Negative for pain and visual disturbance.  Respiratory:  Negative for cough and shortness of breath.   Cardiovascular:  Negative for chest pain and palpitations.  Gastrointestinal:  Positive for abdominal pain,  blood in stool, constipation and nausea. Negative for vomiting.  Genitourinary:  Positive for dysuria. Negative for hematuria.  Musculoskeletal:  Negative for arthralgias and back pain.  Skin:  Negative for color change and rash.  Neurological:  Negative for seizures and syncope.  All other systems reviewed and are negative.  Physical Exam  Updated Vital Signs BP (!) 146/88   Pulse 84   Temp 98.6 F (37 C)   Resp 20   Ht 1.676 m (5\' 6" )   Wt (!) 170.4 kg   SpO2 98%   BMI 60.63 kg/m   Physical Exam Vitals and nursing note reviewed.  Constitutional:      General: She is not in acute distress.    Appearance: Normal appearance. She is well-developed. She is obese. She is not ill-appearing.  HENT:     Head: Normocephalic and atraumatic.  Eyes:     Extraocular Movements: Extraocular movements intact.     Conjunctiva/sclera: Conjunctivae normal.     Pupils: Pupils are equal, round, and reactive to light.  Cardiovascular:     Rate and Rhythm: Normal rate and regular rhythm.     Heart sounds: No murmur heard. Pulmonary:     Effort: Pulmonary effort is normal. No respiratory distress.     Breath sounds: Normal breath sounds.  Abdominal:     Palpations: Abdomen is soft.     Tenderness: There is abdominal tenderness.     Comments: Patient with some diffuse tenderness throughout the abdomen.  Patient is obese.  So abdominal exam is difficult.  Musculoskeletal:     Cervical back: Neck supple.  Skin:    General: Skin is warm and dry.  Neurological:     General: No focal deficit present.     Mental Status: She is alert and oriented to person, place, and time.     Cranial Nerves: No cranial nerve deficit.     Sensory: No sensory deficit.    ED Results / Procedures / Treatments   Labs (all labs ordered are listed, but only abnormal results are displayed) Labs Reviewed  CBC    EKG None  Radiology CT Abdomen Pelvis W Contrast  Result Date: 05/09/2021 CLINICAL DATA:   35 year old female with concern for acute diverticulitis. EXAM: CT ABDOMEN AND PELVIS WITH CONTRAST TECHNIQUE: Multidetector CT imaging of the abdomen and pelvis was performed using the standard protocol following bolus administration of intravenous contrast. CONTRAST:  31 OMNIPAQUE IOHEXOL 350 MG/ML SOLN COMPARISON:  CT abdomen pelvis dated 04/10/2020. FINDINGS: Lower chest: The visualized lung bases are clear. No intra-abdominal free air. Small free fluid within the pelvis. Hepatobiliary: Fatty liver. No intrahepatic biliary ductal dilatation. The gallbladder is unremarkable. Pancreas: Unremarkable. No pancreatic ductal dilatation or surrounding inflammatory changes. Spleen: Normal in size without focal abnormality. Adrenals/Urinary Tract: The adrenal glands are unremarkable. The kidneys, and the visualized ureters appear unremarkable. The urinary bladder is minimally distended. There is thickened appearance of the bladder wall with perivesical stranding likely reactive to inflammatory changes of the sigmoid colon. There is a 2.7 x 1.8 cm loculated fluid protruding to the left bladder dome in similar location as the prior CT. This likely represents a developing abscess of the sigmoid diverticular disease. Developing collection in similar location suggests possible fistulous tract formation. There is loss of fat plane between the left bladder dome and sigmoid colon suggestive of adhesion. No air however is identified within the bladder to suggest a definite complete colovesical fistula at this time. Stomach/Bowel: There is sigmoid diverticulitis. Small fluid collection as described above protruding into the left bladder dome most consistent with developing diverticular abscess. There is no bowel obstruction. The appendix is normal. Vascular/Lymphatic: The abdominal aorta and IVC unremarkable. No portal venous gas. There is no adenopathy. Reproductive: The uterus is grossly unremarkable. No adnexal masses. Other:  None Musculoskeletal:  Degenerative changes primarily at L5-S1. No acute osseous pathology. IMPRESSION: 1. Sigmoid diverticulitis with a developing diverticular abscess protruding into the left bladder dome. Findings suggestive of colovesical adhesion with possible developing fistulous tract. No air is identified within the bladder to suggest a definite complete colovesical fistula at this time. 2. Fatty liver. 3. No bowel obstruction. Normal appendix. Electronically Signed   By: Elgie Collard M.D.   On: 05/09/2021 16:51    Procedures Procedures   Medications Ordered in ED Medications  ondansetron (ZOFRAN) injection 4 mg (4 mg Intravenous Not Given 05/09/21 1608)  ciprofloxacin (CIPRO) IVPB 400 mg (has no administration in time range)  metroNIDAZOLE (FLAGYL) IVPB 500 mg (has no administration in time range)  iohexol (OMNIPAQUE) 350 MG/ML injection 100 mL (100 mLs Intravenous Contrast Given 05/09/21 1630)    ED Course  I have reviewed the triage vital signs and the nursing notes.  Pertinent labs & imaging results that were available during my care of the patient were reviewed by me and considered in my medical decision making (see chart for details).    MDM Rules/Calculators/A&P                           Patient's labs from last evening very reassuring.  We will repeat her CBC since she talks about having blood in her bowel movements.  Make sure there is no change in hemoglobin.  Urinalysis was negative for urinary tract infection.  Will get CT scan abdomen with IV contrast to evaluate the abdominal pain.  Patient is followed by [facility].  CT scan shows complicated diverticulitis with abscess formation up against the bladder.  May be fistula formation.  Based on this feel patient needs admission.  Will treat with IV antibiotics Cipro and Flagyl here.  Will consult general surgery [facility] admission.   Final Clinical Impression(s) / ED Diagnoses Final diagnoses:   Generalized abdominal pain  Diverticulitis of large intestine with abscess, unspecified bleeding status    Rx / DC Orders ED Discharge Orders     None        Vanetta Mulders, MD 05/09/21 1723

## 2021-05-10 LAB — CBC
HCT: 41.1 % (ref 36.0–46.0)
Hemoglobin: 13.6 g/dL (ref 12.0–15.0)
MCH: 31.9 pg (ref 26.0–34.0)
MCHC: 33.1 g/dL (ref 30.0–36.0)
MCV: 96.5 fL (ref 80.0–100.0)
Platelets: 183 10*3/uL (ref 150–400)
RBC: 4.26 MIL/uL (ref 3.87–5.11)
RDW: 12.7 % (ref 11.5–15.5)
WBC: 6 10*3/uL (ref 4.0–10.5)
nRBC: 0 % (ref 0.0–0.2)

## 2021-05-10 LAB — BASIC METABOLIC PANEL
Anion gap: 7 (ref 5–15)
BUN: 7 mg/dL (ref 6–20)
CO2: 25 mmol/L (ref 22–32)
Calcium: 8.8 mg/dL — ABNORMAL LOW (ref 8.9–10.3)
Chloride: 105 mmol/L (ref 98–111)
Creatinine, Ser: 0.82 mg/dL (ref 0.44–1.00)
GFR, Estimated: >60 mL/min (ref >60)
Glucose, Bld: 93 mg/dL (ref 70–99)
Potassium: 3.2 mmol/L — ABNORMAL LOW (ref 3.5–5.1)
Sodium: 137 mmol/L (ref 135–145)

## 2021-05-10 LAB — HIV ANTIBODY (ROUTINE TESTING W REFLEX): HIV Screen 4th Generation wRfx: NONREACTIVE

## 2021-05-10 LAB — TSH: TSH: 39.121 u[IU]/mL — ABNORMAL HIGH (ref 0.350–4.500)

## 2021-05-10 MED ORDER — LACTATED RINGERS IV SOLN: INTRAVENOUS | Status: DC

## 2021-05-10 MED ORDER — ACETAMINOPHEN 650 MG RE SUPP: 650.0000 mg | Freq: Four times a day (QID) | RECTAL | Status: DC | PRN

## 2021-05-10 MED ORDER — ACETAMINOPHEN 325 MG PO TABS
650.0000 mg | ORAL_TABLET | Freq: Four times a day (QID) | ORAL | Status: DC | PRN
  Administered 2021-05-12: 650 mg via ORAL
  Filled 2021-05-10: qty 2

## 2021-05-10 MED ORDER — LEVOTHYROXINE SODIUM 88 MCG PO TABS
88.0000 ug | ORAL_TABLET | Freq: Every day | ORAL | Status: DC
  Administered 2021-05-10 – 2021-05-15 (×6): 88 ug via ORAL
  Filled 2021-05-10 (×6): qty 1

## 2021-05-10 MED ORDER — ONDANSETRON HCL 4 MG PO TABS
4.0000 mg | ORAL_TABLET | Freq: Four times a day (QID) | ORAL | Status: DC | PRN
  Administered 2021-05-13: 4 mg via ORAL
  Filled 2021-05-10: qty 1

## 2021-05-10 MED ORDER — DOCUSATE SODIUM 100 MG PO CAPS
100.0000 mg | ORAL_CAPSULE | Freq: Two times a day (BID) | ORAL | Status: DC
  Administered 2021-05-10 – 2021-05-15 (×9): 100 mg via ORAL
  Filled 2021-05-10 (×9): qty 1

## 2021-05-10 MED ORDER — ENOXAPARIN SODIUM 40 MG/0.4ML IJ SOSY: 40.0000 mg | PREFILLED_SYRINGE | INTRAMUSCULAR | Status: DC

## 2021-05-10 MED ORDER — POTASSIUM CHLORIDE CRYS ER 20 MEQ PO TBCR
40.0000 meq | EXTENDED_RELEASE_TABLET | Freq: Once | ORAL | Status: AC
  Administered 2021-05-10: 40 meq via ORAL
  Filled 2021-05-10: qty 2

## 2021-05-10 MED ORDER — ALBUTEROL SULFATE (2.5 MG/3ML) 0.083% IN NEBU: 2.5000 mg | INHALATION_SOLUTION | RESPIRATORY_TRACT | Status: DC | PRN

## 2021-05-10 MED ORDER — ONDANSETRON HCL 4 MG/2ML IJ SOLN
4.0000 mg | Freq: Four times a day (QID) | INTRAMUSCULAR | Status: DC | PRN
  Administered 2021-05-11: 4 mg via INTRAVENOUS
  Filled 2021-05-10: qty 2

## 2021-05-10 MED ORDER — MORPHINE SULFATE (PF) 2 MG/ML IV SOLN
2.0000 mg | INTRAVENOUS | Status: AC | PRN
  Administered 2021-05-10 – 2021-05-11 (×3): 2 mg via INTRAVENOUS
  Filled 2021-05-10 (×3): qty 1

## 2021-05-10 MED ORDER — PIPERACILLIN-TAZOBACTAM 3.375 G IVPB
3.3750 g | Freq: Three times a day (TID) | INTRAVENOUS | Status: DC
  Administered 2021-05-10 – 2021-05-14 (×13): 3.375 g via INTRAVENOUS
  Filled 2021-05-10 (×27): qty 50

## 2021-05-10 MED ORDER — LOSARTAN POTASSIUM 50 MG PO TABS
50.0000 mg | ORAL_TABLET | Freq: Every day | ORAL | Status: DC
  Administered 2021-05-10 – 2021-05-15 (×6): 50 mg via ORAL
  Filled 2021-05-10 (×6): qty 1

## 2021-05-10 MED ORDER — HEPARIN SODIUM (PORCINE) 5000 UNIT/ML IJ SOLN
5000.0000 [IU] | Freq: Three times a day (TID) | INTRAMUSCULAR | Status: DC
  Administered 2021-05-10 – 2021-05-14 (×14): 5000 [IU] via SUBCUTANEOUS
  Filled 2021-05-10 (×15): qty 1

## 2021-05-10 NOTE — Consult Note (Signed)
Reason for Consult/Chief Complaint: diverticulitis with abscess and concern for colovesical fistula Consultant: Deretha Emory, MD  Lori Mcknight is an 35 y.o. female.   HPI: 31F with a history of diverticulitis with abscess requiring IV abx presents with a one day h/o abdominal pain, smaller stools than usual, and urinary frequency. She follows with Dr. Michaell Cowing for planned sigmoid colectomy and was suspicious her symptoms were due to another episode of diverticulitis, so she came to the ED. She has been having blood in her stool for the last couple of months. No prior colonoscopy.   Past Medical History:  Diagnosis Date   Anemia    history of anemia   Anxiety    Asthma    inhaler as needed   Chest pain of uncertain etiology    intermittent left side chest pain   Chlamydia    Depression    Diverticulitis 2019   Endometriosis    Genital herpes    Headache    MIGRAINES   Hypertension    no meds since April   Hypothyroidism    Obese    Obesity, Class III, BMI 40-49.9 (morbid obesity) (HCC) 11/06/2011   PCOS (polycystic ovarian syndrome)    PONV (postoperative nausea and vomiting)    Shortness of breath    on exertion from weight    Past Surgical History:  Procedure Laterality Date   DILATION AND CURETTAGE OF UTERUS N/A 04/29/2018   Procedure: DILATATION AND CURETTAGE;  Surgeon: Allie Bossier, MD;  Location: [facility];  Service: Gynecology;  Laterality: N/A;   HYSTEROSCOPY WITH D & C N/A 04/06/2014   Procedure: DILATATION AND CURETTAGE /HYSTEROSCOPY ;  Surgeon: Tereso Newcomer, MD;  Location: [facility];  Service: Gynecology;  Laterality: N/A;   LAPAROSCOPY N/A 04/29/2018   Procedure: LAPAROSCOPY DIAGNOSTIC WITH PERITONEAL BIOPSY;  Surgeon: Allie Bossier, MD;  Location: [facility];  Service: Gynecology;  Laterality: N/A;   LEFT HEART CATH AND CORONARY ANGIOGRAPHY N/A 12/12/2016   Procedure: Left Heart Cath and Coronary Angiography;  Surgeon: Peter M Swaziland, MD;  Location: [facility] INVASIVE CV LAB;   Service: Cardiovascular;  Laterality: N/A;   WISDOM TOOTH EXTRACTION      Family History  Problem Relation Age of Onset   Heart disease Mother    Sleep apnea Mother    Depression Mother    Hypertension Mother    Kidney disease Mother    Diabetes Father    Heart disease Father    Colon cancer Father        107s   Hypertension Father    Heart disease Maternal Grandmother    Diabetes Maternal Grandfather    Heart disease Maternal Grandfather    Diabetes Sister    Hypertension Sister    Colon cancer Cousin        died age 53, paternal cousin    Social History:  reports that she has been smoking cigarettes. She has been smoking an average of .25 packs per day. She has never used smokeless tobacco. She reports previous alcohol use. She reports previous drug use. Drug: Marijuana.  Allergies:  Allergies  Allergen Reactions   Dilaudid [Hydromorphone] Rash    Itching all over body    Medications: I have reviewed the patient's current medications.  Results for orders placed or performed during the hospital encounter of 05/09/21 (from the past 48 hour(s))  CBC     Status: None   Collection Time: 05/09/21  4:08 PM  Result Value  Ref Range   WBC 6.6 4.0 - 10.5 K/uL   RBC 4.74 3.87 - 5.11 MIL/uL   Hemoglobin 14.9 12.0 - 15.0 g/dL   HCT 16.145.1 09.636.0 - 04.546.0 %   MCV 95.1 80.0 - 100.0 fL   MCH 31.4 26.0 - 34.0 pg   MCHC 33.0 30.0 - 36.0 g/dL   RDW 40.912.7 81.111.5 - 91.415.5 %   Platelets 195 150 - 400 K/uL   nRBC 0.0 0.0 - 0.2 %    Comment: Performed at [facility], [address], [place], [place] [address]  Resp Panel by RT-PCR (Flu A&B, Covid) Nasopharyngeal Swab     Status: None   Collection Time: 05/09/21  5:47 PM   Specimen: Nasopharyngeal Swab; Nasopharyngeal(NP) swabs in vial transport medium  Result Value Ref Range   SARS Coronavirus 2 by RT PCR NEGATIVE NEGATIVE    Comment: (NOTE) SARS-CoV-2 target nucleic acids are NOT DETECTED.  The SARS-CoV-2 RNA is  generally detectable in upper respiratory specimens during the acute phase of infection. The lowest concentration of SARS-CoV-2 viral copies this assay can detect is 138 copies/mL. A negative result does not preclude SARS-Cov-2 infection and should not be used as the sole basis for treatment or other patient management decisions. A negative result may occur with  improper specimen collection/handling, submission of specimen other than nasopharyngeal swab, presence of viral mutation(s) within the areas targeted by this assay, and inadequate number of viral copies(<138 copies/mL). A negative result must be combined with clinical observations, patient history, and epidemiological information. The expected result is Negative.  Fact Sheet for Patients:  BloggerCourse.comhttps://www.fda.gov/media/152166/download  Fact Sheet for Healthcare Providers:  SeriousBroker.ithttps://www.fda.gov/media/152162/download  This test is no t yet approved or cleared by the [place] FDA and  has been authorized for detection and/or diagnosis of SARS-CoV-2 by FDA under an Emergency Use Authorization (EUA). This EUA will remain  in effect (meaning this test can be used) for the duration of the COVID-19 declaration under Section 564(b)(1) of the Act, 21 U.S.C.section 360bbb-3(b)(1), unless the authorization is terminated  or revoked sooner.       Influenza A by PCR NEGATIVE NEGATIVE   Influenza B by PCR NEGATIVE NEGATIVE    Comment: (NOTE) The Xpert Xpress SARS-CoV-2/FLU/RSV plus assay is intended as an aid in the diagnosis of influenza from Nasopharyngeal swab specimens and should not be used as a sole basis for treatment. Nasal washings and aspirates are unacceptable for Xpert Xpress SARS-CoV-2/FLU/RSV testing.  Fact Sheet for Patients: BloggerCourse.comhttps://www.fda.gov/media/152166/download  Fact Sheet for Healthcare Providers: SeriousBroker.ithttps://www.fda.gov/media/152162/download  This test is not yet approved or cleared by the [place] FDA  and has been authorized for detection and/or diagnosis of SARS-CoV-2 by FDA under an Emergency Use Authorization (EUA). This EUA will remain in effect (meaning this test can be used) for the duration of the COVID-19 declaration under Section 564(b)(1) of the Act, 21 U.S.C. section 360bbb-3(b)(1), unless the authorization is terminated or revoked.  Performed at [facility], [address], [place], [place] [address]   Magnesium     Status: None   Collection Time: 05/09/21 10:00 PM  Result Value Ref Range   Magnesium 1.9 1.7 - 2.4 mg/dL    Comment: Performed at [facility], [address] [place], [place] [address]  Basic metabolic panel     Status: Abnormal   Collection Time: 05/10/21  4:18 AM  Result Value Ref Range   Sodium 137 135 - 145 mmol/L   Potassium 3.2 (  L) 3.5 - 5.1 mmol/L   Chloride 105 98 - 111 mmol/L   CO2 25 22 - 32 mmol/L   Glucose, Bld 93 70 - 99 mg/dL    Comment: Glucose reference range applies only to samples taken after fasting for at least 8 hours.   BUN 7 6 - 20 mg/dL   Creatinine, Ser 6.57 0.44 - 1.00 mg/dL   Calcium 8.8 (L) 8.9 - 10.3 mg/dL   GFR, Estimated >84 >69 mL/min    Comment: (NOTE) Calculated using the CKD-EPI Creatinine Equation (2021)    Anion gap 7 5 - 15    Comment: Performed at [facility], [address] [place], [place] [address]  CBC     Status: None   Collection Time: 05/10/21  4:18 AM  Result Value Ref Range   WBC 6.0 4.0 - 10.5 K/uL   RBC 4.26 3.87 - 5.11 MIL/uL   Hemoglobin 13.6 12.0 - 15.0 g/dL   HCT 84.1 32.4 - 40.1 %   MCV 96.5 80.0 - 100.0 fL   MCH 31.9 26.0 - 34.0 pg   MCHC 33.1 30.0 - 36.0 g/dL   RDW 02.7 25.3 - 66.4 %   Platelets 183 150 - 400 K/uL   nRBC 0.0 0.0 - 0.2 %    Comment: Performed at [facility], [address], [place], [place] [address]  TSH     Status: Abnormal   Collection Time: 05/10/21  4:18 AM  Result Value Ref Range   TSH 39.121 (H) 0.350 - 4.500 uIU/mL     Comment: Performed by a 3rd Generation assay with a functional sensitivity of <=0.01 uIU/mL. Performed at [facility], [address], [place], [place] [address]     CT Abdomen Pelvis W Contrast  Result Date: 05/09/2021 CLINICAL DATA:  35 year old female with concern for acute diverticulitis. EXAM: CT ABDOMEN AND PELVIS WITH CONTRAST TECHNIQUE: Multidetector CT imaging of the abdomen and pelvis was performed using the standard protocol following bolus administration of intravenous contrast. CONTRAST:  OMNIPAQUE IOHEXOL 350 MG/ML SOLN COMPARISON:  CT abdomen pelvis dated 04/10/2020. FINDINGS: Lower chest: The visualized lung bases are clear. No intra-abdominal free air. Small free fluid within the pelvis. Hepatobiliary: Fatty liver. No intrahepatic biliary ductal dilatation. The gallbladder is unremarkable. Pancreas: Unremarkable. No pancreatic ductal dilatation or surrounding inflammatory changes. Spleen: Normal in size without focal abnormality. Adrenals/Urinary Tract: The adrenal glands are unremarkable. The kidneys, and the visualized ureters appear unremarkable. The urinary bladder is minimally distended. There is thickened appearance of the bladder wall with perivesical stranding likely reactive to inflammatory changes of the sigmoid colon. There is a 2.7 x 1.8 cm loculated fluid protruding to the left bladder dome in similar location as the prior CT. This likely represents a developing abscess of the sigmoid diverticular disease. Developing collection in similar location suggests possible fistulous tract formation. There is loss of fat plane between the left bladder dome and sigmoid colon suggestive of adhesion. No air however is identified within the bladder to suggest a definite complete colovesical fistula at this time. Stomach/Bowel: There is sigmoid diverticulitis. Small fluid collection as described above protruding into the left bladder dome most consistent with developing  diverticular abscess. There is no bowel obstruction. The appendix is normal. Vascular/Lymphatic: The abdominal aorta and IVC unremarkable. No portal venous gas. There is no adenopathy. Reproductive: The uterus is grossly unremarkable. No adnexal masses. Other: None Musculoskeletal: Degenerative changes primarily at L5-S1. No acute osseous  pathology. IMPRESSION: 1. Sigmoid diverticulitis with a developing diverticular abscess protruding into the left bladder dome. Findings suggestive of colovesical adhesion with possible developing fistulous tract. No air is identified within the bladder to suggest a definite complete colovesical fistula at this time. 2. Fatty liver. 3. No bowel obstruction. Normal appendix. Electronically Signed   By: Elgie Collard M.D.   On: 05/09/2021 16:51    ROS 10 point review of systems is negative except as listed above in HPI.   Physical Exam Blood pressure 111/60, pulse 73, temperature 98.3 F (36.8 C), temperature source Axillary, resp. rate 17, height 5\' 6"  (1.676 m), weight (!) 170.4 kg, SpO2 97 %. Constitutional: well-developed, well-nourished HEENT: pupils equal, round, reactive to light, 49mm b/l, moist conjunctiva, external inspection of ears and nose normal, hearing intact Oropharynx: normal oropharyngeal mucosa, normal dentition Neck: no thyromegaly, trachea midline, no midline cervical tenderness to palpation Chest: breath sounds equal bilaterally, normal respiratory effort, no midline or lateral chest wall tenderness to palpation/deformity Abdomen: soft, obese, diffusely but mildly tender to palpation, no bruising, no hepatosplenomegaly GU: normal female genitalia  Back: no wounds, no thoracic/lumbar spine tenderness to palpation, no thoracic/lumbar spine stepoffs Rectal: deferred Extremities: 2+ radial and pedal pulses bilaterally, motor and sensation intact to bilateral UE and LE, no peripheral edema MSK: unable to assess gait/station, no clubbing/cyanosis  of fingers/toes, normal ROM of all four extremities Skin: warm, dry, no rashes Psych: normal memory, normal mood/affect    Assessment/Plan: 57F with diverticulitis and small abscess. Abscess likely too small to be amenable t IR drainage. Recommend antibiotics and PO trial. After resolution of this episode, recommend colonoscopy +/- BE to eval for colovesical fistula and elective sigmoid colectomy. Plan to have patient f/u in the office with Dr. 3m after discharge.   Michaell Cowing, MD General and Trauma Surgery [facility] Surgery

## 2021-05-10 NOTE — Progress Notes (Signed)
PROGRESS NOTE    Lori Mcknight  TMA:[id number] DOB: [birth year] DOA: 05/09/2021 PCP: Claiborne Rigg, NP   Chief Complaint  Patient presents with   Abdominal Pain   Urinary Pain    Brief Narrative: 35yof w/ hx of HT, [place], hypothyroidism, hx of endometriosis, diverticulitis in 2020 presented with 5 day history of lower abdomen pain. In ED,vitals stable, CT abd showed diverticulitis with developing abscess, was palced on iv antibuiotics, surgery consulted and admitted to [facility]  Subjective: Seen and examined this morning complains of generalized abdominal pain Afebrile overnight No leukocytosis.  Assessment & Plan:  Diverticulitis of large intestine with smallabscess: Hemodynamically stable no leukocytosis or fever but has generalized abdominal pain.  General surgery has been consulted they feel it is too small to be amenable for IR drainage and recommend antibiotics p.o. trial.  Patient did not get her colonoscopy in her last episode in 2020 strongly so still getting colonoscopy once this acute issue resolves.  Continue Zosyn, IV fluids, pain control. Recent Labs  Lab 05/08/21 2002 05/09/21 1608 05/10/21 0418  WBC 6.7 6.6 6.0    Essential hypertension: Well-controlled on losartan  Hypothyroidism: Continue home Synthroid  Obesity, morbid, BMI 50 or higher: Benefit with weight loss healthy lifestyle PCP follow-up  Hypokalemia-replete orally.  Asthma, mild intermittent: Not in exacerbation.  Diet Order             Diet clear liquid Room service appropriate? Yes; Fluid consistency: Thin  Diet effective now                   Patient's Body mass index is 60.63 kg/m.  DVT prophylaxis: Place TED hose Start: 05/10/21 0240.  Add Lovenox for DVT prophylaxis Code Status:   Code Status: Full Code  Family Communication: plan of care discussed with patient at bedside. Status is: Inpatient Remains inpatient appropriate because:IV treatments appropriate due to intensity of  illness or inability to take PO and Inpatient level of care appropriate due to severity of illness Dispo: The patient is from: Home              Anticipated d/c is to: Home              Patient currently is not medically stable to d/c.   Difficult to place patient No Unresulted Labs (From admission, onward)     Start     Ordered   05/10/21 0240  HIV Antibody (routine testing w rflx)  (HIV Antibody (Routine testing w reflex) panel)  Once,   R        05/10/21 0239           Medications reviewed:  Scheduled Meds:  levothyroxine  88 mcg Oral QAC breakfast   losartan  50 mg Oral Daily   Continuous Infusions:  lactated ringers 125 mL/hr at 05/10/21 0400   piperacillin-tazobactam 3.375 g (05/10/21 0510)   Consultants:see note  Procedures:see note Antimicrobials: Anti-infectives (From admission, onward)    Start     Dose/Rate Route Frequency Ordered Stop   05/10/21 0600  piperacillin-tazobactam (ZOSYN) IVPB 3.375 g        3.375 g 12.5 mL/hr over 240 Minutes Intravenous Every 8 hours 05/10/21 0239     05/09/21 1730  ciprofloxacin (CIPRO) IVPB 400 mg        400 mg 200 mL/hr over 60 Minutes Intravenous  Once 05/09/21 1720 05/09/21 1900   05/09/21 1730  metroNIDAZOLE (FLAGYL) IVPB 500 mg  500 mg 100 mL/hr over 60 Minutes Intravenous  Once 05/09/21 1720 05/09/21 1828      Culture/Microbiology    Component Value Date/Time   SDES URINE, RANDOM 07/29/2019 1329   SPECREQUEST NONE 07/29/2019 1329   CULT (A) 07/29/2019 1329    <10,000 COLONIES/mL INSIGNIFICANT GROWTH Performed at [facility], [address], [place], [place] [address]    REPTSTATUS 07/30/2019 FINAL 07/29/2019 1329    Other culture-see note  Objective: Vitals: Today's Vitals   05/09/21 1900 05/09/21 2118 05/10/21 0000 05/10/21 0427  BP:  (!) 144/91 118/70 111/60  Pulse:  83 77 73  Resp:  20 18 17   Temp:  98.1 F (36.7 C) 98.5 F (36.9 C) 98.3 F (36.8 C)  TempSrc:  Oral Axillary Axillary   SpO2:  99% 97% 97%  Weight:      Height:      PainSc: 3  4      No intake or output data in the 24 hours ending 05/10/21 0718 Filed Weights   05/09/21 1236  Weight: (!) 170.4 kg   Weight change:   Intake/Output from previous day: No intake/output data recorded. Intake/Output this shift: No intake/output data recorded. Filed Weights   05/09/21 1236  Weight: (!) 170.4 kg   Examination: General exam: AAO x3, morbidly obese pleasant, in mild pain HEENT:Oral mucosa moist, Ear/Nose WNL grossly,dentition normal. Respiratory system: bilaterally diminished,no use of accessory muscle, non tender. Cardiovascular system: S1 & S2 +,No JVD. Gastrointestinal system: Abdomen soft, generalized tenderness present no rebound tenderness Nervous System:Alert, awake, moving extremities Extremities: no edema, distal peripheral pulses palpable.  Skin: No rashes,no icterus. MSK: Normal muscle bulk,tone, power Data Reviewed: I have personally reviewed following labs and imaging studies CBC: Recent Labs  Lab 05/08/21 2002 05/09/21 1608 05/10/21 0418  WBC 6.7 6.6 6.0  NEUTROABS 3.9  --   --   HGB 14.6 14.9 13.6  HCT 45.1 45.1 41.1  MCV 97.0 95.1 96.5  PLT 230 195 183   Basic Metabolic Panel: Recent Labs  Lab 05/08/21 2002 05/09/21 2200 05/10/21 0418  NA 134*  --  137  K 3.3*  --  3.2*  CL 104  --  105  CO2 22  --  25  GLUCOSE 99  --  93  BUN 5*  --  7  CREATININE 0.85  --  0.82  CALCIUM 8.9  --  8.8*  MG  --  1.9  --    GFR: Estimated Creatinine Clearance: 155.3 mL/min (by C-G formula based on SCr of 0.82 mg/dL). Liver Function Tests: Recent Labs  Lab 05/08/21 2002  AST 30  ALT 27  ALKPHOS 67  BILITOT 0.2*  PROT 7.6  ALBUMIN 3.8   No results for input(s): LIPASE, AMYLASE in the last 168 hours. No results for input(s): AMMONIA in the last 168 hours. Coagulation Profile: No results for input(s): INR, PROTIME in the last 168 hours. Cardiac Enzymes: No results for  input(s): CKTOTAL, CKMB, CKMBINDEX, TROPONINI in the last 168 hours. BNP (last 3 results) No results for input(s): PROBNP in the last 8760 hours. HbA1C: No results for input(s): HGBA1C in the last 72 hours. CBG: No results for input(s): GLUCAP in the last 168 hours. Lipid Profile: No results for input(s): CHOL, HDL, LDLCALC, TRIG, CHOLHDL, LDLDIRECT in the last 72 hours. Thyroid Function Tests: Recent Labs    05/10/21 0418  TSH 39.121*   Anemia Panel: No results for input(s): VITAMINB12, FOLATE, FERRITIN, TIBC, IRON, RETICCTPCT in  the last 72 hours. Sepsis Labs: No results for input(s): PROCALCITON, LATICACIDVEN in the last 168 hours.  Recent Results (from the past 240 hour(s))  Resp Panel by RT-PCR (Flu A&B, Covid) Nasopharyngeal Swab     Status: None   Collection Time: 05/09/21  5:47 PM   Specimen: Nasopharyngeal Swab; Nasopharyngeal(NP) swabs in vial transport medium  Result Value Ref Range Status   SARS Coronavirus 2 by RT PCR NEGATIVE NEGATIVE Final    Comment: (NOTE) SARS-CoV-2 target nucleic acids are NOT DETECTED.  The SARS-CoV-2 RNA is generally detectable in upper respiratory specimens during the acute phase of infection. The lowest concentration of SARS-CoV-2 viral copies this assay can detect is 138 copies/mL. A negative result does not preclude SARS-Cov-2 infection and should not be used as the sole basis for treatment or other patient management decisions. A negative result may occur with  improper specimen collection/handling, submission of specimen other than nasopharyngeal swab, presence of viral mutation(s) within the areas targeted by this assay, and inadequate number of viral copies(<138 copies/mL). A negative result must be combined with clinical observations, patient history, and epidemiological information. The expected result is Negative.  Fact Sheet for Patients:  BloggerCourse.com  Fact Sheet for Healthcare Providers:   SeriousBroker.it  This test is no t yet approved or cleared by the [place] FDA and  has been authorized for detection and/or diagnosis of SARS-CoV-2 by FDA under an Emergency Use Authorization (EUA). This EUA will remain  in effect (meaning this test can be used) for the duration of the COVID-19 declaration under Section 564(b)(1) of the Act, 21 U.S.C.section 360bbb-3(b)(1), unless the authorization is terminated  or revoked sooner.       Influenza A by PCR NEGATIVE NEGATIVE Final   Influenza B by PCR NEGATIVE NEGATIVE Final    Comment: (NOTE) The Xpert Xpress SARS-CoV-2/FLU/RSV plus assay is intended as an aid in the diagnosis of influenza from Nasopharyngeal swab specimens and should not be used as a sole basis for treatment. Nasal washings and aspirates are unacceptable for Xpert Xpress SARS-CoV-2/FLU/RSV testing.  Fact Sheet for Patients: BloggerCourse.com  Fact Sheet for Healthcare Providers: SeriousBroker.it  This test is not yet approved or cleared by the [place] FDA and has been authorized for detection and/or diagnosis of SARS-CoV-2 by FDA under an Emergency Use Authorization (EUA). This EUA will remain in effect (meaning this test can be used) for the duration of the COVID-19 declaration under Section 564(b)(1) of the Act, 21 U.S.C. section 360bbb-3(b)(1), unless the authorization is terminated or revoked.  Performed at [facility], [address], [place], [place] [address]      Radiology Studies: CT Abdomen Pelvis W Contrast  Result Date: 05/09/2021 CLINICAL DATA:  35 year old female with concern for acute diverticulitis. EXAM: CT ABDOMEN AND PELVIS WITH CONTRAST TECHNIQUE: Multidetector CT imaging of the abdomen and pelvis was performed using the standard protocol following bolus administration of intravenous contrast. CONTRAST:  OMNIPAQUE IOHEXOL  350 MG/ML SOLN COMPARISON:  CT abdomen pelvis dated 04/10/2020. FINDINGS: Lower chest: The visualized lung bases are clear. No intra-abdominal free air. Small free fluid within the pelvis. Hepatobiliary: Fatty liver. No intrahepatic biliary ductal dilatation. The gallbladder is unremarkable. Pancreas: Unremarkable. No pancreatic ductal dilatation or surrounding inflammatory changes. Spleen: Normal in size without focal abnormality. Adrenals/Urinary Tract: The adrenal glands are unremarkable. The kidneys, and the visualized ureters appear unremarkable. The urinary bladder is minimally distended. There is thickened appearance of the bladder wall with perivesical stranding likely  reactive to inflammatory changes of the sigmoid colon. There is a 2.7 x 1.8 cm loculated fluid protruding to the left bladder dome in similar location as the prior CT. This likely represents a developing abscess of the sigmoid diverticular disease. Developing collection in similar location suggests possible fistulous tract formation. There is loss of fat plane between the left bladder dome and sigmoid colon suggestive of adhesion. No air however is identified within the bladder to suggest a definite complete colovesical fistula at this time. Stomach/Bowel: There is sigmoid diverticulitis. Small fluid collection as described above protruding into the left bladder dome most consistent with developing diverticular abscess. There is no bowel obstruction. The appendix is normal. Vascular/Lymphatic: The abdominal aorta and IVC unremarkable. No portal venous gas. There is no adenopathy. Reproductive: The uterus is grossly unremarkable. No adnexal masses. Other: None Musculoskeletal: Degenerative changes primarily at L5-S1. No acute osseous pathology. IMPRESSION: 1. Sigmoid diverticulitis with a developing diverticular abscess protruding into the left bladder dome. Findings suggestive of colovesical adhesion with possible developing fistulous tract.  No air is identified within the bladder to suggest a definite complete colovesical fistula at this time. 2. Fatty liver. 3. No bowel obstruction. Normal appendix. Electronically Signed   By: Elgie CollardArash  Radparvar M.D.   On: 05/09/2021 16:51     LOS: 1 day   Lanae Boastamesh Kimanh Templeman, MD [facility]  05/10/2021, 7:18 AM

## 2021-05-11 LAB — BASIC METABOLIC PANEL
Anion gap: 7 (ref 5–15)
BUN: <5 mg/dL — ABNORMAL LOW (ref 6–20)
CO2: 27 mmol/L (ref 22–32)
Calcium: 8.7 mg/dL — ABNORMAL LOW (ref 8.9–10.3)
Chloride: 103 mmol/L (ref 98–111)
Creatinine, Ser: 0.87 mg/dL (ref 0.44–1.00)
GFR, Estimated: >60 mL/min (ref >60)
Glucose, Bld: 101 mg/dL — ABNORMAL HIGH (ref 70–99)
Potassium: 3.3 mmol/L — ABNORMAL LOW (ref 3.5–5.1)
Sodium: 137 mmol/L (ref 135–145)

## 2021-05-11 LAB — CBC
HCT: 42.4 % (ref 36.0–46.0)
Hemoglobin: 14.3 g/dL (ref 12.0–15.0)
MCH: 31.8 pg (ref 26.0–34.0)
MCHC: 33.7 g/dL (ref 30.0–36.0)
MCV: 94.4 fL (ref 80.0–100.0)
Platelets: 192 10*3/uL (ref 150–400)
RBC: 4.49 MIL/uL (ref 3.87–5.11)
RDW: 12.6 % (ref 11.5–15.5)
WBC: 6 10*3/uL (ref 4.0–10.5)
nRBC: 0 % (ref 0.0–0.2)

## 2021-05-11 MED ORDER — PHENAZOPYRIDINE HCL 100 MG PO TABS
100.0000 mg | ORAL_TABLET | Freq: Three times a day (TID) | ORAL | Status: DC
  Administered 2021-05-11 – 2021-05-15 (×12): 100 mg via ORAL
  Filled 2021-05-11 (×16): qty 1

## 2021-05-11 MED ORDER — POTASSIUM CHLORIDE CRYS ER 20 MEQ PO TBCR
40.0000 meq | EXTENDED_RELEASE_TABLET | Freq: Two times a day (BID) | ORAL | Status: AC
  Administered 2021-05-11 (×2): 40 meq via ORAL
  Filled 2021-05-11 (×2): qty 2

## 2021-05-11 NOTE — Progress Notes (Signed)
PROGRESS NOTE    Lori Mcknight  HMC:[id number] DOB: 07/13/1986 DOA: 05/09/2021 PCP: Claiborne Rigg, NP   Chief Complaint  Patient presents with   Abdominal Pain   Urinary Pain    Brief Narrative: 35yof w/ hx of HT, [place], hypothyroidism, hx of endometriosis, diverticulitis in 2020 presented with 5 day history of lower abdomen pain. In ED,vitals stable, CT abd showed diverticulitis with developing abscess, was palced on iv antibuiotics, surgery consulted and admitted to [facility] Seen by surgery abscess too small for IR drainage and on medical management.  Subjective: Afebrile overnight No leukocytosis. Patient reports he had episodes of loose stool but after being on stool softener Had liquid BM mixed with blood last night but this morning no blood  Assessment & Plan:  Diverticulitis of large intestine with small abscess: General surgery has been consulted they feel it is too small to be amenable for IR drainage and recommend antibiotics p.o. trial.  Continue IV Zosyn, p.o. diet per surgery, IV hydration pain management.  Outpatient colonoscopy in 6 weeks.  Monitor bowel movement for bleeding  Impending/evolving colovesical fistula: Some bloody BM.  Pyridium.  Supportive care  Essential hypertension: Controlled on losartan  Hypothyroidism: Continue home Synthroid  Obesity, morbid, BMI 50 or higher: Benefit with weight loss healthy lifestyle PCP follow-up  Hypokalemia-we will replete orally   Asthma, mild intermittent: Not in exacerbation.  Constipation continue Colace Diet Order             Diet clear liquid Room service appropriate? Yes; Fluid consistency: Thin  Diet effective now                   Patient's Body mass index is 61.81 kg/m.  DVT prophylaxis: heparin injection 5,000 Units Start: 05/10/21 1400 Place TED hose Start: 05/10/21 0240.  Add Lovenox for DVT prophylaxis Code Status:   Code Status: Full Code  Family Communication: plan of care discussed with  patient at bedside. Status is: Inpatient Remains inpatient appropriate because:IV treatments appropriate due to intensity of illness or inability to take PO and Inpatient level of care appropriate due to severity of illness Dispo: The patient is from: Home              Anticipated d/c is to: Home              Patient currently is not medically stable to d/c.   Difficult to place patient No Unresulted Labs (From admission, onward)     Start     Ordered   05/17/21 0500  Creatinine, serum  (enoxaparin (LOVENOX)    CrCl >/= 30 ml/min)  Weekly,   R     Comments: while on enoxaparin therapy   Question:  Specimen collection method  Answer:  Lab=Lab collect   05/10/21 1047   05/11/21 0500  Basic metabolic panel  Daily,   R     Question:  Specimen collection method  Answer:  Lab=Lab collect   05/10/21 1047   05/11/21 0000  CBC  Every 48 hours,   R     Question:  Specimen collection method  Answer:  Lab=Lab collect   05/10/21 1047           Medications reviewed:  Scheduled Meds:  docusate sodium  100 mg Oral BID   heparin injection (subcutaneous)  5,000 Units Subcutaneous Q8H   levothyroxine  88 mcg Oral QAC breakfast   losartan  50 mg Oral Daily   phenazopyridine  100 mg Oral  TID WC   potassium chloride  40 mEq Oral BID   Continuous Infusions:  lactated ringers 50 mL/hr at 05/11/21 0932   piperacillin-tazobactam 3.375 g (05/11/21 0502)   Consultants:see note  Procedures:see note Antimicrobials: Anti-infectives (From admission, onward)    Start     Dose/Rate Route Frequency Ordered Stop   05/10/21 0600  piperacillin-tazobactam (ZOSYN) IVPB 3.375 g        3.375 g 12.5 mL/hr over 240 Minutes Intravenous Every 8 hours 05/10/21 0239     05/09/21 1730  ciprofloxacin (CIPRO) IVPB 400 mg        400 mg 200 mL/hr over 60 Minutes Intravenous  Once 05/09/21 1720 05/09/21 1900   05/09/21 1730  metroNIDAZOLE (FLAGYL) IVPB 500 mg        500 mg 100 mL/hr over 60 Minutes Intravenous  Once  05/09/21 1720 05/09/21 1828      Culture/Microbiology    Component Value Date/Time   SDES URINE, RANDOM 07/29/2019 1329   SPECREQUEST NONE 07/29/2019 1329   CULT (A) 07/29/2019 1329    <10,000 COLONIES/mL INSIGNIFICANT GROWTH Performed at [facility], [address], [place], [place] [address]    REPTSTATUS 07/30/2019 FINAL 07/29/2019 1329    Other culture-see note  Objective: Vitals: Today's Vitals   05/11/21 0400 05/11/21 0448 05/11/21 0749 05/11/21 0941  BP:  119/67 128/82   Pulse: 71 87 78   Resp:   15   Temp:  98.4 F (36.9 C) 98.2 F (36.8 C)   TempSrc:  Oral Oral   SpO2: 100% 97% 95%   Weight: (!) 173.7 kg     Height:      PainSc:    8    No intake or output data in the 24 hours ending 05/11/21 1058 Filed Weights   05/09/21 1236 05/11/21 0400  Weight: (!) 170.4 kg (!) 173.7 kg   Weight change: 3.3 kg  Intake/Output from previous day: No intake/output data recorded. Intake/Output this shift: No intake/output data recorded. Filed Weights   05/09/21 1236 05/11/21 0400  Weight: (!) 170.4 kg (!) 173.7 kg   Examination: General exam: AAOx 3 older than stated age, weak appearing. HEENT:Oral mucosa moist, Ear/Nose WNL grossly, dentition normal. Respiratory system: bilaterally diminished, no use of accessory muscle Cardiovascular system: S1 & S2 +, No JVD,. Gastrointestinal system: Abdomen soft, generalized tenderness no rebound, morbidly obese abdomen Nervous System:Alert, awake, moving extremities and grossly nonfocal Extremities: no edema, distal peripheral pulses palpable.  Skin: No rashes,no icterus. MSK: Normal muscle bulk,tone, power  Data Reviewed: I have personally reviewed following labs and imaging studies CBC: Recent Labs  Lab 05/08/21 2002 05/09/21 1608 05/10/21 0418 05/11/21 0129  WBC 6.7 6.6 6.0 6.0  NEUTROABS 3.9  --   --   --   HGB 14.6 14.9 13.6 14.3  HCT 45.1 45.1 41.1 42.4  MCV 97.0 95.1 96.5 94.4  PLT 230 195 183 192    Basic Metabolic Panel: Recent Labs  Lab 05/08/21 2002 05/09/21 2200 05/10/21 0418 05/11/21 0129  NA 134*  --  137 137  K 3.3*  --  3.2* 3.3*  CL 104  --  105 103  CO2 22  --  25 27  GLUCOSE 99  --  93 101*  BUN 5*  --  7 <5*  CREATININE 0.85  --  0.82 0.87  CALCIUM 8.9  --  8.8* 8.7*  MG  --  1.9  --   --    GFR: Estimated  Creatinine Clearance: 148.3 mL/min (by C-G formula based on SCr of 0.87 mg/dL). Liver Function Tests: Recent Labs  Lab 05/08/21 2002  AST 30  ALT 27  ALKPHOS 67  BILITOT 0.2*  PROT 7.6  ALBUMIN 3.8   No results for input(s): LIPASE, AMYLASE in the last 168 hours. No results for input(s): AMMONIA in the last 168 hours. Coagulation Profile: No results for input(s): INR, PROTIME in the last 168 hours. Cardiac Enzymes: No results for input(s): CKTOTAL, CKMB, CKMBINDEX, TROPONINI in the last 168 hours. BNP (last 3 results) No results for input(s): PROBNP in the last 8760 hours. HbA1C: No results for input(s): HGBA1C in the last 72 hours. CBG: No results for input(s): GLUCAP in the last 168 hours. Lipid Profile: No results for input(s): CHOL, HDL, LDLCALC, TRIG, CHOLHDL, LDLDIRECT in the last 72 hours. Thyroid Function Tests: Recent Labs    05/10/21 0418  TSH 39.121*   Anemia Panel: No results for input(s): VITAMINB12, FOLATE, FERRITIN, TIBC, IRON, RETICCTPCT in the last 72 hours. Sepsis Labs: No results for input(s): PROCALCITON, LATICACIDVEN in the last 168 hours.  Recent Results (from the past 240 hour(s))  Resp Panel by RT-PCR (Flu A&B, Covid) Nasopharyngeal Swab     Status: None   Collection Time: 05/09/21  5:47 PM   Specimen: Nasopharyngeal Swab; Nasopharyngeal(NP) swabs in vial transport medium  Result Value Ref Range Status   SARS Coronavirus 2 by RT PCR NEGATIVE NEGATIVE Final    Comment: (NOTE) SARS-CoV-2 target nucleic acids are NOT DETECTED.  The SARS-CoV-2 RNA is generally detectable in upper respiratory specimens during  the acute phase of infection. The lowest concentration of SARS-CoV-2 viral copies this assay can detect is 138 copies/mL. A negative result does not preclude SARS-Cov-2 infection and should not be used as the sole basis for treatment or other patient management decisions. A negative result may occur with  improper specimen collection/handling, submission of specimen other than nasopharyngeal swab, presence of viral mutation(s) within the areas targeted by this assay, and inadequate number of viral copies(<138 copies/mL). A negative result must be combined with clinical observations, patient history, and epidemiological information. The expected result is Negative.  Fact Sheet for Patients:  BloggerCourse.comhttps://www.fda.gov/media/152166/download  Fact Sheet for Healthcare Providers:  SeriousBroker.ithttps://www.fda.gov/media/152162/download  This test is no t yet approved or cleared by the [place] FDA and  has been authorized for detection and/or diagnosis of SARS-CoV-2 by FDA under an Emergency Use Authorization (EUA). This EUA will remain  in effect (meaning this test can be used) for the duration of the COVID-19 declaration under Section 564(b)(1) of the Act, 21 U.S.C.section 360bbb-3(b)(1), unless the authorization is terminated  or revoked sooner.       Influenza A by PCR NEGATIVE NEGATIVE Final   Influenza B by PCR NEGATIVE NEGATIVE Final    Comment: (NOTE) The Xpert Xpress SARS-CoV-2/FLU/RSV plus assay is intended as an aid in the diagnosis of influenza from Nasopharyngeal swab specimens and should not be used as a sole basis for treatment. Nasal washings and aspirates are unacceptable for Xpert Xpress SARS-CoV-2/FLU/RSV testing.  Fact Sheet for Patients: BloggerCourse.comhttps://www.fda.gov/media/152166/download  Fact Sheet for Healthcare Providers: SeriousBroker.ithttps://www.fda.gov/media/152162/download  This test is not yet approved or cleared by the [place] FDA and has been authorized for detection and/or  diagnosis of SARS-CoV-2 by FDA under an Emergency Use Authorization (EUA). This EUA will remain in effect (meaning this test can be used) for the duration of the COVID-19 declaration under Section 564(b)(1) of the Act, 21 U.S.C.  section 360bbb-3(b)(1), unless the authorization is terminated or revoked.  Performed at [facility], [address], [place], [place] [address]      Radiology Studies: CT Abdomen Pelvis W Contrast  Result Date: 05/09/2021 CLINICAL DATA:  35 year old female with concern for acute diverticulitis. EXAM: CT ABDOMEN AND PELVIS WITH CONTRAST TECHNIQUE: Multidetector CT imaging of the abdomen and pelvis was performed using the standard protocol following bolus administration of intravenous contrast. CONTRAST:  OMNIPAQUE IOHEXOL 350 MG/ML SOLN COMPARISON:  CT abdomen pelvis dated 04/10/2020. FINDINGS: Lower chest: The visualized lung bases are clear. No intra-abdominal free air. Small free fluid within the pelvis. Hepatobiliary: Fatty liver. No intrahepatic biliary ductal dilatation. The gallbladder is unremarkable. Pancreas: Unremarkable. No pancreatic ductal dilatation or surrounding inflammatory changes. Spleen: Normal in size without focal abnormality. Adrenals/Urinary Tract: The adrenal glands are unremarkable. The kidneys, and the visualized ureters appear unremarkable. The urinary bladder is minimally distended. There is thickened appearance of the bladder wall with perivesical stranding likely reactive to inflammatory changes of the sigmoid colon. There is a 2.7 x 1.8 cm loculated fluid protruding to the left bladder dome in similar location as the prior CT. This likely represents a developing abscess of the sigmoid diverticular disease. Developing collection in similar location suggests possible fistulous tract formation. There is loss of fat plane between the left bladder dome and sigmoid colon suggestive of adhesion. No air however is identified  within the bladder to suggest a definite complete colovesical fistula at this time. Stomach/Bowel: There is sigmoid diverticulitis. Small fluid collection as described above protruding into the left bladder dome most consistent with developing diverticular abscess. There is no bowel obstruction. The appendix is normal. Vascular/Lymphatic: The abdominal aorta and IVC unremarkable. No portal venous gas. There is no adenopathy. Reproductive: The uterus is grossly unremarkable. No adnexal masses. Other: None Musculoskeletal: Degenerative changes primarily at L5-S1. No acute osseous pathology. IMPRESSION: 1. Sigmoid diverticulitis with a developing diverticular abscess protruding into the left bladder dome. Findings suggestive of colovesical adhesion with possible developing fistulous tract. No air is identified within the bladder to suggest a definite complete colovesical fistula at this time. 2. Fatty liver. 3. No bowel obstruction. Normal appendix. Electronically Signed   By: Elgie Collard M.D.   On: 05/09/2021 16:51     LOS: 2 days   Lanae Boast, MD [facility]  05/11/2021, 10:58 AM

## 2021-05-11 NOTE — Progress Notes (Signed)
Attempted to place a PIV x 2 with no success. Poor vasculature. RN notified and will contact the physician. Upper extremity veins are very deep and/or split.

## 2021-05-11 NOTE — Progress Notes (Addendum)
NOTIFIED DR [facility] of continuous watery stool that patient is concerned about and that she has no IV Access to Give her IV antibiotics. New orders for Midline placement to receive her antibiotics.   2200- IV team RN at bedside.

## 2021-05-11 NOTE — Progress Notes (Signed)
  Subjective No acute events. Pain better/stable. No emesis, some nausea. Passing gas and having liquid bm. bladder spasms/discomfort  Objective: Vital signs in last 24 hours: Temp:  [97.6 F (36.4 C)-98.6 F (37 C)] 98.2 F (36.8 C) (07/30 0749) Pulse Rate:  [71-98] 78 (07/30 0749) Resp:  [15-20] 15 (07/30 0749) BP: (118-158)/(57-101) 128/82 (07/30 0749) SpO2:  [95 %-100 %] 95 % (07/30 0749) Weight:  [173.7 kg] 173.7 kg (07/30 0400) Last BM Date: 05/10/21  Intake/Output from previous day: No intake/output data recorded. Intake/Output this shift: No intake/output data recorded.  Gen: NAD, comfortable CV: RRR Pulm: Normal work of breathing Abd: Soft, obese, mildly ttp llq; distention difficult to assess Ext: SCDs in place  Lab Results: CBC  Recent Labs    05/10/21 0418 05/11/21 0129  WBC 6.0 6.0  HGB 13.6 14.3  HCT 41.1 42.4  PLT 183 192   BMET Recent Labs    05/10/21 0418 05/11/21 0129  NA 137 137  K 3.2* 3.3*  CL 105 103  CO2 25 27  GLUCOSE 93 101*  BUN 7 <5*  CREATININE 0.82 0.87  CALCIUM 8.8* 8.7*   PT/INR No results for input(s): LABPROT, INR in the last 72 hours. ABG No results for input(s): PHART, HCO3 in the last 72 hours.  Invalid input(s): PCO2, PO2  Studies/Results:  Anti-infectives: Anti-infectives (From admission, onward)    Start     Dose/Rate Route Frequency Ordered Stop   05/10/21 0600  piperacillin-tazobactam (ZOSYN) IVPB 3.375 g        3.375 g 12.5 mL/hr over 240 Minutes Intravenous Every 8 hours 05/10/21 0239     05/09/21 1730  ciprofloxacin (CIPRO) IVPB 400 mg        400 mg 200 mL/hr over 60 Minutes Intravenous  Once 05/09/21 1720 05/09/21 1900   05/09/21 1730  metroNIDAZOLE (FLAGYL) IVPB 500 mg        500 mg 100 mL/hr over 60 Minutes Intravenous  Once 05/09/21 1720 05/09/21 1828        Assessment/Plan: Patient Active Problem List   Diagnosis Date Noted   Generalized abdominal pain 05/09/2021   Diverticulitis of  large intestine with abscess 05/09/2021   Hypokalemia 05/09/2021   Asthma, mild intermittent 05/09/2021   Diverticulitis 03/26/2020   Obesity, morbid, BMI 50 or higher (HCC) 08/08/2019   Endometriosis s/p ablation 08/08/2019   Dysuria 08/08/2019   Family history of colon cancer in father 08/08/2019   Acute diverticulitis 08/07/2019   Incomplete bladder emptying 02/18/2018   DUB (dysfunctional uterine bleeding) 02/18/2018   Anemia 02/18/2018   Hypothyroid 01/29/2017   Migraines 08/26/2016   Chest pain 02/04/2016   Abnormal uterine bleeding (AUB) 01/09/2014   Thickened endometrium 12/02/2013   Essential hypertension 10/21/2013   PCOS (polycystic ovarian syndrome) 11/06/2011   Infertility, female 11/06/2011   Pelvic pain in female 08/01/2011   43F with diverticulitis and small abscess  -Continue IV abx -Sips liquids from floor -Pyridium as she likely has impending/evolving colovesical fistula   LOS: 2 days   Marin Olp, MD [facility] Use [email or web address] to contact on call provider

## 2021-05-12 LAB — BASIC METABOLIC PANEL
Anion gap: 8 (ref 5–15)
BUN: <5 mg/dL — ABNORMAL LOW (ref 6–20)
CO2: 26 mmol/L (ref 22–32)
Calcium: 9 mg/dL (ref 8.9–10.3)
Chloride: 104 mmol/L (ref 98–111)
Creatinine, Ser: 0.83 mg/dL (ref 0.44–1.00)
GFR, Estimated: >60 mL/min (ref >60)
Glucose, Bld: 86 mg/dL (ref 70–99)
Potassium: 3.7 mmol/L (ref 3.5–5.1)
Sodium: 138 mmol/L (ref 135–145)

## 2021-05-12 MED ORDER — TRAMADOL HCL 50 MG PO TABS
50.0000 mg | ORAL_TABLET | Freq: Four times a day (QID) | ORAL | Status: DC | PRN
Start: 2021-05-12 — End: 2021-05-15
  Administered 2021-05-12 – 2021-05-14 (×4): 50 mg via ORAL
  Filled 2021-05-12 (×4): qty 1

## 2021-05-12 NOTE — Progress Notes (Signed)
PROGRESS NOTE    Lori Mcknight  MWN:[id number] DOB: 03-09-1986 DOA: 05/09/2021 PCP: Claiborne Rigg, NP   Chief Complaint  Patient presents with   Abdominal Pain   Urinary Pain    Brief Narrative: 35yof w/ hx of HT, [place], hypothyroidism, hx of endometriosis, diverticulitis in 2020 presented with 5 day history of lower abdomen pain. In ED,vitals stable, CT abd showed diverticulitis with developing abscess, was palced on iv antibuiotics, surgery consulted and admitted to [facility] Seen by surgery abscess too small for IR drainage and on medical management. Patient is managed with IV Zosyn pain management IV fluids.  Subjective: Seen and examined this morning.  She feels better less dysuria had running stool last night.  Otherwise afebrile complains of abdominal cramping on lower abdomen but overall much improved   Assessment & Plan:  Diverticulitis of large intestine with small abscess: Surgery following close closely, abscess not amenable to IR drainage.  Continue IV Zosyn, IV fluids and clear liquid diet advancing to soft diet, continue Pyridium for impending/evolving colovesical fistula.Outpatient colonoscopy in 6 weeks.  Monitor bowel movement for bleeding  Impending/evolving colovesical fistula: Pyridium.  Supportive care  Essential hypertension: Blood pressure is stable on losartan  Hypothyroidism: on Synthroid. TSH up at 39- check ft4.  Obesity, morbid, BMI 50 or higher: Benefit with weight loss healthy lifestyle PCP follow-up  Hypokalemia-resolved  Asthma, mild intermittent: Not in exacerbation.  Constipation -now having diarrhea discontinued Colace.   Diet Order             Diet clear liquid Room service appropriate? Yes; Fluid consistency: Thin  Diet effective now                   Patient's Body mass index is 61.81 kg/m.  DVT prophylaxis: heparin injection 5,000 Units Start: 05/10/21 1400 Place TED hose Start: 05/10/21 0240.  Add Lovenox for DVT  prophylaxis Code Status:   Code Status: Full Code  Family Communication: plan of care discussed with patient at bedside. Status is: Inpatient Remains inpatient appropriate because:IV treatments appropriate due to intensity of illness or inability to take PO and Inpatient level of care appropriate due to severity of illness Dispo: The patient is from: Home              Anticipated d/c is to: Home              Patient currently is not medically stable to d/c.   Difficult to place patient No Unresulted Labs (From admission, onward)     Start     Ordered   05/17/21 0500  Creatinine, serum  (enoxaparin (LOVENOX)    CrCl >/= 30 ml/min)  Weekly,   R     Comments: while on enoxaparin therapy   Question:  Specimen collection method  Answer:  Lab=Lab collect   05/10/21 1047   05/11/21 0500  Basic metabolic panel  Daily,   R     Question:  Specimen collection method  Answer:  Lab=Lab collect   05/10/21 1047   05/11/21 0000  CBC  Every 48 hours,   R     Question:  Specimen collection method  Answer:  Lab=Lab collect   05/10/21 1047           Medications reviewed:  Scheduled Meds:  docusate sodium  100 mg Oral BID   heparin injection (subcutaneous)  5,000 Units Subcutaneous Q8H   levothyroxine  88 mcg Oral QAC breakfast   losartan  50 mg  Oral Daily   phenazopyridine  100 mg Oral TID WC   Continuous Infusions:  lactated ringers 50 mL/hr at 05/11/21 2232   piperacillin-tazobactam 3.375 g (05/12/21 16100611)   Consultants:see note  Procedures:see note Antimicrobials: Anti-infectives (From admission, onward)    Start     Dose/Rate Route Frequency Ordered Stop   05/10/21 0600  piperacillin-tazobactam (ZOSYN) IVPB 3.375 g        3.375 g 12.5 mL/hr over 240 Minutes Intravenous Every 8 hours 05/10/21 0239     05/09/21 1730  ciprofloxacin (CIPRO) IVPB 400 mg        400 mg 200 mL/hr over 60 Minutes Intravenous  Once 05/09/21 1720 05/09/21 1900   05/09/21 1730  metroNIDAZOLE (FLAGYL) IVPB 500  mg        500 mg 100 mL/hr over 60 Minutes Intravenous  Once 05/09/21 1720 05/09/21 1828      Culture/Microbiology    Component Value Date/Time   SDES URINE, RANDOM 07/29/2019 1329   SPECREQUEST NONE 07/29/2019 1329   CULT (A) 07/29/2019 1329    <10,000 COLONIES/mL INSIGNIFICANT GROWTH Performed at [facility], [address] [place], [place] [id number]    REPTSTATUS 07/30/2019 FINAL 07/29/2019 1329    Other culture-see note  Objective: Vitals: Today's Vitals   05/12/21 0348 05/12/21 0541 05/12/21 0750 05/12/21 0900  BP: 120/70  125/70   Pulse: 85  68   Resp: 18  15   Temp: 97.8 F (36.6 C)  97.9 F (36.6 C)   TempSrc: Oral  Oral   SpO2: 93%  98%   Weight:      Height:      PainSc:  7   5     Intake/Output Summary (Last 24 hours) at 05/12/2021 1000 Last data filed at 05/12/2021 0200 Gross per 24 hour  Intake 2391.35 ml  Output --  Net 2391.35 ml   Filed Weights   05/09/21 1236 05/11/21 0400  Weight: (!) 170.4 kg (!) 173.7 kg   Weight change:   Intake/Output from previous day: 07/30 0701 - 07/31 0700 In: 2391.4 [P.O.:240; I.V.:1842.2; IV Piggyback:309.2] Out: -  Intake/Output this shift: No intake/output data recorded. Filed Weights   05/09/21 1236 05/11/21 0400  Weight: (!) 170.4 kg (!) 173.7 kg   Examination: General exam: AAOx3, morbidly obese, not distressed.   HEENT:Oral mucosa moist, Ear/Nose WNL grossly, dentition normal. Respiratory system: bilaterally diminished, no use of accessory muscle Cardiovascular system: S1 & S2 +, No JVD,. Gastrointestinal system: Abdomen soft, obese abdomen mild tender left lower quadrant Nervous System:Alert, awake, moving extremities and grossly nonfocal Extremities: no edema, distal peripheral pulses palpable.  Skin: No rashes,no icterus. MSK: Normal muscle bulk,tone, power  Data Reviewed: I have personally reviewed following labs and imaging studies CBC: Recent Labs  Lab 05/08/21 2002 05/09/21 1608  05/10/21 0418 05/11/21 0129  WBC 6.7 6.6 6.0 6.0  NEUTROABS 3.9  --   --   --   HGB 14.6 14.9 13.6 14.3  HCT 45.1 45.1 41.1 42.4  MCV 97.0 95.1 96.5 94.4  PLT 230 195 183 192    Basic Metabolic Panel: Recent Labs  Lab 05/08/21 2002 05/09/21 2200 05/10/21 0418 05/11/21 0129 05/12/21 0025  NA 134*  --  137 137 138  K 3.3*  --  3.2* 3.3* 3.7  CL 104  --  105 103 104  CO2 22  --  25 27 26   GLUCOSE 99  --  93 101* 86  BUN 5*  --  7 <5* <5*  CREATININE 0.85  --  0.82 0.87 0.83  CALCIUM 8.9  --  8.8* 8.7* 9.0  MG  --  1.9  --   --   --     GFR: Estimated Creatinine Clearance: 155.5 mL/min (by C-G formula based on SCr of 0.83 mg/dL). Liver Function Tests: Recent Labs  Lab 05/08/21 2002  AST 30  ALT 27  ALKPHOS 67  BILITOT 0.2*  PROT 7.6  ALBUMIN 3.8    No results for input(s): LIPASE, AMYLASE in the last 168 hours. No results for input(s): AMMONIA in the last 168 hours. Coagulation Profile: No results for input(s): INR, PROTIME in the last 168 hours. Cardiac Enzymes: No results for input(s): CKTOTAL, CKMB, CKMBINDEX, TROPONINI in the last 168 hours. BNP (last 3 results) No results for input(s): PROBNP in the last 8760 hours. HbA1C: No results for input(s): HGBA1C in the last 72 hours. CBG: No results for input(s): GLUCAP in the last 168 hours. Lipid Profile: No results for input(s): CHOL, HDL, LDLCALC, TRIG, CHOLHDL, LDLDIRECT in the last 72 hours. Thyroid Function Tests: Recent Labs    05/10/21 0418  TSH 39.121*    Anemia Panel: No results for input(s): VITAMINB12, FOLATE, FERRITIN, TIBC, IRON, RETICCTPCT in the last 72 hours. Sepsis Labs: No results for input(s): PROCALCITON, LATICACIDVEN in the last 168 hours.  Recent Results (from the past 240 hour(s))  Resp Panel by RT-PCR (Flu A&B, Covid) Nasopharyngeal Swab     Status: None   Collection Time: 05/09/21  5:47 PM   Specimen: Nasopharyngeal Swab; Nasopharyngeal(NP) swabs in vial transport medium   Result Value Ref Range Status   SARS Coronavirus 2 by RT PCR NEGATIVE NEGATIVE Final    Comment: (NOTE) SARS-CoV-2 target nucleic acids are NOT DETECTED.  The SARS-CoV-2 RNA is generally detectable in upper respiratory specimens during the acute phase of infection. The lowest concentration of SARS-CoV-2 viral copies this assay can detect is 138 copies/mL. A negative result does not preclude SARS-Cov-2 infection and should not be used as the sole basis for treatment or other patient management decisions. A negative result may occur with  improper specimen collection/handling, submission of specimen other than nasopharyngeal swab, presence of viral mutation(s) within the areas targeted by this assay, and inadequate number of viral copies(<138 copies/mL). A negative result must be combined with clinical observations, patient history, and epidemiological information. The expected result is Negative.  Fact Sheet for Patients:  BloggerCourse.com  Fact Sheet for Healthcare Providers:  SeriousBroker.it  This test is no t yet approved or cleared by the [place] FDA and  has been authorized for detection and/or diagnosis of SARS-CoV-2 by FDA under an Emergency Use Authorization (EUA). This EUA will remain  in effect (meaning this test can be used) for the duration of the COVID-19 declaration under Section 564(b)(1) of the Act, 21 U.S.C.section 360bbb-3(b)(1), unless the authorization is terminated  or revoked sooner.       Influenza A by PCR NEGATIVE NEGATIVE Final   Influenza B by PCR NEGATIVE NEGATIVE Final    Comment: (NOTE) The Xpert Xpress SARS-CoV-2/FLU/RSV plus assay is intended as an aid in the diagnosis of influenza from Nasopharyngeal swab specimens and should not be used as a sole basis for treatment. Nasal washings and aspirates are unacceptable for Xpert Xpress SARS-CoV-2/FLU/RSV testing.  Fact Sheet for  Patients: BloggerCourse.com  Fact Sheet for Healthcare Providers: SeriousBroker.it  This test is not yet approved or cleared by the [place] FDA and has  been authorized for detection and/or diagnosis of SARS-CoV-2 by FDA under an Emergency Use Authorization (EUA). This EUA will remain in effect (meaning this test can be used) for the duration of the COVID-19 declaration under Section 564(b)(1) of the Act, 21 U.S.C. section 360bbb-3(b)(1), unless the authorization is terminated or revoked.  Performed at [facility], [address] [place], [place] [address]       Radiology Studies: No results found.   LOS: 3 days   Lanae Boast, MD [facility]  05/12/2021, 10:00 AM

## 2021-05-12 NOTE — Progress Notes (Signed)
  Subjective Abdominal pain improved. Having BM and passing flatus. Urinary symptoms improved from yesterday. No emesis, no nausea. ambulating  Objective: Vital signs in last 24 hours: Temp:  [97.7 F (36.5 C)-98 F (36.7 C)] 97.9 F (36.6 C) (07/31 0750) Pulse Rate:  [68-85] 68 (07/31 0750) Resp:  [15-18] 15 (07/31 0750) BP: (120-128)/(70-94) 125/70 (07/31 0750) SpO2:  [93 %-99 %] 98 % (07/31 0750) Last BM Date: 05/10/21  Intake/Output from previous day: 07/30 0701 - 07/31 0700 In: 2391.4 [P.O.:240; I.V.:1842.2; IV Piggyback:309.2] Out: -  Intake/Output this shift: No intake/output data recorded.  Gen: NAD, comfortable, laying in bed CV: RRR Pulm: normal respiratory effort. Abd: Soft, obese, mild focal ttp llq; distention difficult to assess. +BS Ext: SCDs in place  Lab Results: CBC  Recent Labs    05/10/21 0418 05/11/21 0129  WBC 6.0 6.0  HGB 13.6 14.3  HCT 41.1 42.4  PLT 183 192    BMET Recent Labs    05/11/21 0129 05/12/21 0025  NA 137 138  K 3.3* 3.7  CL 103 104  CO2 27 26  GLUCOSE 101* 86  BUN <5* <5*  CREATININE 0.87 0.83  CALCIUM 8.7* 9.0    PT/INR No results for input(s): LABPROT, INR in the last 72 hours. ABG No results for input(s): PHART, HCO3 in the last 72 hours.  Invalid input(s): PCO2, PO2  Studies/Results:  Anti-infectives: Anti-infectives (From admission, onward)    Start     Dose/Rate Route Frequency Ordered Stop   05/10/21 0600  piperacillin-tazobactam (ZOSYN) IVPB 3.375 g        3.375 g 12.5 mL/hr over 240 Minutes Intravenous Every 8 hours 05/10/21 0239     05/09/21 1730  ciprofloxacin (CIPRO) IVPB 400 mg        400 mg 200 mL/hr over 60 Minutes Intravenous  Once 05/09/21 1720 05/09/21 1900   05/09/21 1730  metroNIDAZOLE (FLAGYL) IVPB 500 mg        500 mg 100 mL/hr over 60 Minutes Intravenous  Once 05/09/21 1720 05/09/21 1828        Assessment/Plan: Patient Active Problem List   Diagnosis Date Noted   Generalized  abdominal pain 05/09/2021   Diverticulitis of large intestine with abscess 05/09/2021   Hypokalemia 05/09/2021   Asthma, mild intermittent 05/09/2021   Diverticulitis 03/26/2020   Obesity, morbid, BMI 50 or higher (HCC) 08/08/2019   Endometriosis s/p ablation 08/08/2019   Dysuria 08/08/2019   Family history of colon cancer in father 08/08/2019   Acute diverticulitis 08/07/2019   Incomplete bladder emptying 02/18/2018   DUB (dysfunctional uterine bleeding) 02/18/2018   Anemia 02/18/2018   Hypothyroid 01/29/2017   Migraines 08/26/2016   Chest pain 02/04/2016   Abnormal uterine bleeding (AUB) 01/09/2014   Thickened endometrium 12/02/2013   Essential hypertension 10/21/2013   PCOS (polycystic ovarian syndrome) 11/06/2011   Infertility, female 11/06/2011   Pelvic pain in female 08/01/2011   55F with diverticulitis and small abscess - not amenable to IR drainage -Continue IV abx - clears today -Pyridium as she likely has impending/evolving colovesical fistula  FEN: CLD ID: [email or web address] VTE: heparin sq   LOS: 3 days   Eric Form, [facility] Surgery 05/12/2021, 8:46 AM Please see Amion for pager number during day hours 7:00am-4:30pm

## 2021-05-13 ENCOUNTER — Other Ambulatory Visit: Payer: Self-pay

## 2021-05-13 LAB — CBC
HCT: 41.5 % (ref 36.0–46.0)
Hemoglobin: 13.9 g/dL (ref 12.0–15.0)
MCH: 32.4 pg (ref 26.0–34.0)
MCHC: 33.5 g/dL (ref 30.0–36.0)
MCV: 96.7 fL (ref 80.0–100.0)
Platelets: 187 10*3/uL (ref 150–400)
RBC: 4.29 MIL/uL (ref 3.87–5.11)
RDW: 12.5 % (ref 11.5–15.5)
WBC: 6.5 10*3/uL (ref 4.0–10.5)
nRBC: 0 % (ref 0.0–0.2)

## 2021-05-13 LAB — BASIC METABOLIC PANEL
Anion gap: 8 (ref 5–15)
BUN: <5 mg/dL — ABNORMAL LOW (ref 6–20)
CO2: 26 mmol/L (ref 22–32)
Calcium: 9.1 mg/dL (ref 8.9–10.3)
Chloride: 105 mmol/L (ref 98–111)
Creatinine, Ser: 0.84 mg/dL (ref 0.44–1.00)
GFR, Estimated: >60 mL/min (ref >60)
Glucose, Bld: 93 mg/dL (ref 70–99)
Potassium: 3.4 mmol/L — ABNORMAL LOW (ref 3.5–5.1)
Sodium: 139 mmol/L (ref 135–145)

## 2021-05-13 LAB — T4, FREE: Free T4: 0.67 ng/dL (ref 0.61–1.12)

## 2021-05-13 MED ORDER — POTASSIUM CHLORIDE CRYS ER 20 MEQ PO TBCR
40.0000 meq | EXTENDED_RELEASE_TABLET | Freq: Once | ORAL | Status: AC
  Administered 2021-05-13: 40 meq via ORAL
  Filled 2021-05-13: qty 2

## 2021-05-13 NOTE — Progress Notes (Signed)
  Subjective Abdominal pain improved. Having BM and passing flatus. Urinary symptoms improved with addition of pyridium.  Tolerating CLD  Objective: Vital signs in last 24 hours: Temp:  [97.7 F (36.5 C)-98.1 F (36.7 C)] 97.7 F (36.5 C) (08/01 0820) Pulse Rate:  [64-74] 74 (08/01 0820) Resp:  [16-18] 18 (08/01 0820) BP: (105-136)/(54-87) 114/54 (08/01 0820) SpO2:  [96 %-100 %] 99 % (08/01 0042) Last BM Date: 05/13/21  Intake/Output from previous day: No intake/output data recorded. Intake/Output this shift: No intake/output data recorded.  Gen: NAD, comfortable, laying in bed CV: RRR Pulm: normal respiratory effort. Abd: Soft, obese, mild focal ttp llq, +BS Ext: SCDs in place  Lab Results: CBC  Recent Labs    05/11/21 0129 05/13/21 0016  WBC 6.0 6.5  HGB 14.3 13.9  HCT 42.4 41.5  PLT 192 187   BMET Recent Labs    05/12/21 0025 05/13/21 0016  NA 138 139  K 3.7 3.4*  CL 104 105  CO2 26 26  GLUCOSE 86 93  BUN <5* <5*  CREATININE 0.83 0.84  CALCIUM 9.0 9.1   PT/INR No results for input(s): LABPROT, INR in the last 72 hours. ABG No results for input(s): PHART, HCO3 in the last 72 hours.  Invalid input(s): PCO2, PO2  Studies/Results:  Anti-infectives: Anti-infectives (From admission, onward)    Start     Dose/Rate Route Frequency Ordered Stop   05/10/21 0600  piperacillin-tazobactam (ZOSYN) IVPB 3.375 g        3.375 g 12.5 mL/hr over 240 Minutes Intravenous Every 8 hours 05/10/21 0239     05/09/21 1730  ciprofloxacin (CIPRO) IVPB 400 mg        400 mg 200 mL/hr over 60 Minutes Intravenous  Once 05/09/21 1720 05/09/21 1900   05/09/21 1730  metroNIDAZOLE (FLAGYL) IVPB 500 mg        500 mg 100 mL/hr over 60 Minutes Intravenous  Once 05/09/21 1720 05/09/21 1828        Assessment/Plan: diverticulitis and small abscess and possible development of colovesical fistula - not amenable to IR drainage -Continue IV abx - adv to full liquids  today -Pyridium as she likely has impending/evolving colovesical fistula -hopefully can adv diet and transition to oral abx therapy tomorrow and home tomorrow vs Wednesday -FU with Dr. Michaell Cowing  FEN: FLD ID: [email or web address] VTE: heparin sq   LOS: 4 days   Letha [facility] Surgery 05/13/2021, 9:28 AM Please see Amion for pager number during day hours 7:00am-4:30pm

## 2021-05-13 NOTE — Progress Notes (Signed)
PROGRESS NOTE    Lori Mcknight  ZOX:[id number] DOB: 1986/09/06 DOA: 05/09/2021 PCP: Claiborne RiggFleming, Zelda W, NP   Chief Complaint  Patient presents with   Abdominal Pain   Urinary Pain    Brief Narrative: 35yof w/ hx of HT, Asthman, hypothyroidism, hx of endometriosis, diverticulitis in 2020 presented with 5 day history of lower abdomen pain. In ED,vitals stable, CT abd showed diverticulitis with developing abscess, was palced on iv antibuiotics, surgery consulted and [facility] Seen by surgery abscess too small for IR drainage and on medical management. Patient is managed with IV Zosyn pain management IV fluids.  Subjective: Seen this morning.  Having bowel movement, passing flatus.  Feels much better. Afebrile overnight. Lab with low potassium 2 BMs charted yesterday.  Assessment & Plan:  Diverticulitis of large intestine with small abscess: Surgery following close closely, abscess not amenable to IR drainage.  On conservative medical management with IV Zosyn, IV fluids and clear liquid diet -advanced to  diet to soft diet today.On  Pyridium for impending/evolving colovesical fistula.Outpatient colonoscopy in 6 weeks.  Monitor bowel movement for bleeding  Impending/evolving colovesical fistula: Continue Pyreddy.  Essential hypertension: Well-controlled on home losartan.  Hypothyroidism: on Synthroid 88 MCG. TSH up at 39- check ft4.  Consider increasing the dose.  Obesity, morbid, BMI 50 or higher: Patient will benefit with weight loss, healthy lifestyle and PCP follow-up as outpatient.    Hypokalemia-replacement ordered.  Asthma, mild intermittent: Not in exacerbation.  Constipation -now having diarrhea -discontinued Colace.   Diet Order             Diet full liquid Room service appropriate? Yes; Fluid consistency: Thin  Diet effective now                   Patient's Body mass index is 61.81 kg/m.  DVT prophylaxis: heparin injection 5,000 Units Start: 05/10/21  1400 Place TED hose Start: 05/10/21 0240.  Add Lovenox for DVT prophylaxis Code Status:   Code Status: Full Code  Family Communication: plan of care discussed with patient at bedside. Status is: Inpatient Remains inpatient appropriate because:IV treatments appropriate due to intensity of illness or inability to take PO and Inpatient level of care appropriate due to severity of illness Dispo: The patient is from: Home              Anticipated d/c is to: Home once okay w/ surgery, likely tomorrow              Patient currently is not medically stable to d/c.   Difficult to place patient No Unresulted Labs (From admission, onward)     Start     Ordered   05/17/21 0500  Creatinine, serum  (enoxaparin (LOVENOX)    CrCl >/= 30 ml/min)  Weekly,   R     Comments: while on enoxaparin therapy   Question:  Specimen collection method  Answer:  Lab=Lab collect   05/10/21 1047   05/11/21 0000  CBC  Every 48 hours,   R     Question:  Specimen collection method  Answer:  Lab=Lab collect   05/10/21 1047           Medications reviewed:  Scheduled Meds:  docusate sodium  100 mg Oral BID   heparin injection (subcutaneous)  5,000 Units Subcutaneous Q8H   levothyroxine  88 mcg Oral QAC breakfast   losartan  50 mg Oral Daily   phenazopyridine  100 mg Oral TID WC   Continuous  Infusions:  lactated ringers 75 mL/hr at 05/12/21 1139   piperacillin-tazobactam 3.375 g (05/13/21 0607)   Consultants:see note  Procedures:see note Antimicrobials: Anti-infectives (From admission, onward)    Start     Dose/Rate Route Frequency Ordered Stop   05/10/21 0600  piperacillin-tazobactam (ZOSYN) IVPB 3.375 g        3.375 g 12.5 mL/hr over 240 Minutes Intravenous Every 8 hours 05/10/21 0239     05/09/21 1730  ciprofloxacin (CIPRO) IVPB 400 mg        400 mg 200 mL/hr over 60 Minutes Intravenous  Once 05/09/21 1720 05/09/21 1900   05/09/21 1730  metroNIDAZOLE (FLAGYL) IVPB 500 mg        500 mg 100 mL/hr over  60 Minutes Intravenous  Once 05/09/21 1720 05/09/21 1828      Culture/Microbiology    Component Value Date/Time   SDES URINE, RANDOM 07/29/2019 1329   SPECREQUEST NONE 07/29/2019 1329   CULT (A) 07/29/2019 1329    <10,000 COLONIES/mL INSIGNIFICANT GROWTH Performed at [facility], [address] [place], [place] [address]    REPTSTATUS 07/30/2019 FINAL 07/29/2019 1329    Other culture-see note  Objective: Vitals: Today's Vitals   05/12/21 2000 05/12/21 2043 05/13/21 0042 05/13/21 0820  BP:  136/76 105/65 (!) 114/54  Pulse:  72 64 74  Resp:  16 16 18   Temp:  97.8 F (36.6 C) 98.1 F (36.7 C) 97.7 F (36.5 C)  TempSrc:  Oral Oral Oral  SpO2:  96% 99%   Weight:      Height:      PainSc: 0-No pain   0-No pain   No intake or output data in the 24 hours ending 05/13/21 0939  Filed Weights   05/09/21 1236 05/11/21 0400  Weight: (!) 170.4 kg (!) 173.7 kg   Weight change:   Intake/Output from previous day: No intake/output data recorded. Intake/Output this shift: No intake/output data recorded. Filed Weights   05/09/21 1236 05/11/21 0400  Weight: (!) 170.4 kg (!) 173.7 kg   Examination: General exam: AAOx 3, obese, pleasant  HEENT:Oral mucosa moist, Ear/Nose WNL grossly, dentition normal. Respiratory system: bilaterally diminished, no use of accessory muscle Cardiovascular system: S1 & S2 +, No JVD,. Gastrointestinal system: Abdomen soft, mildly tender left lower quadrant NT,ND, BS+ Nervous System:Alert, awake, moving extremities and grossly nonfocal Extremities: no edema, distal peripheral pulses palpable.  Skin: No rashes,no icterus. MSK: Normal muscle bulk,tone, power   Data Reviewed: I have personally reviewed following labs and imaging studies CBC: Recent Labs  Lab 05/08/21 2002 05/09/21 1608 05/10/21 0418 05/11/21 0129 05/13/21 0016  WBC 6.7 6.6 6.0 6.0 6.5  NEUTROABS 3.9  --   --   --   --   HGB 14.6 14.9 13.6 14.3 13.9  HCT 45.1 45.1 41.1  42.4 41.5  MCV 97.0 95.1 96.5 94.4 96.7  PLT 230 195 183 192 187   Basic Metabolic Panel: Recent Labs  Lab 05/08/21 2002 05/09/21 2200 05/10/21 0418 05/11/21 0129 05/12/21 0025 05/13/21 0016  NA 134*  --  137 137 138 139  K 3.3*  --  3.2* 3.3* 3.7 3.4*  CL 104  --  105 103 104 105  CO2 22  --  25 27 26 26   GLUCOSE 99  --  93 101* 86 93  BUN 5*  --  7 <5* <5* <5*  CREATININE 0.85  --  0.82 0.87 0.83 0.84  CALCIUM 8.9  --  8.8* 8.7* 9.0  9.1  MG  --  1.9  --   --   --   --    GFR: Estimated Creatinine Clearance: 153.6 mL/min (by C-G formula based on SCr of 0.84 mg/dL). Liver Function Tests: Recent Labs  Lab 05/08/21 2002  AST 30  ALT 27  ALKPHOS 67  BILITOT 0.2*  PROT 7.6  ALBUMIN 3.8   No results for input(s): LIPASE, AMYLASE in the last 168 hours. No results for input(s): AMMONIA in the last 168 hours. Coagulation Profile: No results for input(s): INR, PROTIME in the last 168 hours. Cardiac Enzymes: No results for input(s): CKTOTAL, CKMB, CKMBINDEX, TROPONINI in the last 168 hours. BNP (last 3 results) No results for input(s): PROBNP in the last 8760 hours. HbA1C: No results for input(s): HGBA1C in the last 72 hours. CBG: No results for input(s): GLUCAP in the last 168 hours. Lipid Profile: No results for input(s): CHOL, HDL, LDLCALC, TRIG, CHOLHDL, LDLDIRECT in the last 72 hours. Thyroid Function Tests: Recent Labs    05/13/21 0725  FREET4 0.67   Anemia Panel: No results for input(s): VITAMINB12, FOLATE, FERRITIN, TIBC, IRON, RETICCTPCT in the last 72 hours. Sepsis Labs: No results for input(s): PROCALCITON, LATICACIDVEN in the last 168 hours.  Recent Results (from the past 240 hour(s))  Resp Panel by RT-PCR (Flu A&B, Covid) Nasopharyngeal Swab     Status: None   Collection Time: 05/09/21  5:47 PM   Specimen: Nasopharyngeal Swab; Nasopharyngeal(NP) swabs in vial transport medium  Result Value Ref Range Status   SARS Coronavirus 2 by RT PCR NEGATIVE  NEGATIVE Final    Comment: (NOTE) SARS-CoV-2 target nucleic acids are NOT DETECTED.  The SARS-CoV-2 RNA is generally detectable in upper respiratory specimens during the acute phase of infection. The lowest concentration of SARS-CoV-2 viral copies this assay can detect is 138 copies/mL. A negative result does not preclude SARS-Cov-2 infection and should not be used as the sole basis for treatment or other patient management decisions. A negative result may occur with  improper specimen collection/handling, submission of specimen other than nasopharyngeal swab, presence of viral mutation(s) within the areas targeted by this assay, and inadequate number of viral copies(<138 copies/mL). A negative result must be combined with clinical observations, patient history, and epidemiological information. The expected result is Negative.  Fact Sheet for Patients:  BloggerCourse.com  Fact Sheet for Healthcare Providers:  SeriousBroker.it  This test is no t yet approved or cleared by the [place] FDA and  has been authorized for detection and/or diagnosis of SARS-CoV-2 by FDA under an Emergency Use Authorization (EUA). This EUA will remain  in effect (meaning this test can be used) for the duration of the COVID-19 declaration under Section 564(b)(1) of the Act, 21 U.S.C.section 360bbb-3(b)(1), unless the authorization is terminated  or revoked sooner.       Influenza A by PCR NEGATIVE NEGATIVE Final   Influenza B by PCR NEGATIVE NEGATIVE Final    Comment: (NOTE) The Xpert Xpress SARS-CoV-2/FLU/RSV plus assay is intended as an aid in the diagnosis of influenza from Nasopharyngeal swab specimens and should not be used as a sole basis for treatment. Nasal washings and aspirates are unacceptable for Xpert Xpress SARS-CoV-2/FLU/RSV testing.  Fact Sheet for Patients: BloggerCourse.com  Fact Sheet for Healthcare  Providers: SeriousBroker.it  This test is not yet approved or cleared by the [place] FDA and has been authorized for detection and/or diagnosis of SARS-CoV-2 by FDA under an Emergency Use Authorization (EUA). This EUA will remain  in effect (meaning this test can be used) for the duration of the COVID-19 declaration under Section 564(b)(1) of the Act, 21 U.S.C. section 360bbb-3(b)(1), unless the authorization is terminated or revoked.  Performed at [facility], [address], [place], [place] [address]       Radiology Studies: No results found.   LOS: 4 days   Lanae Boast, MD [facility]  05/13/2021, 9:39 AM

## 2021-05-13 NOTE — Progress Notes (Signed)
Pt notified RN that after eating full liquid diet she began to experience abdominal cramping and pain and diarrhea, pt given prn Tramadol and [facility]etta [facility] surgical PA made aware, will see how pt does with next full liquid meal.

## 2021-05-14 MED ORDER — AMOXICILLIN-POT CLAVULANATE 875-125 MG PO TABS
1.0000 | ORAL_TABLET | Freq: Two times a day (BID) | ORAL | Status: DC
  Administered 2021-05-14 – 2021-05-15 (×3): 1 via ORAL
  Filled 2021-05-14 (×3): qty 1

## 2021-05-14 NOTE — Progress Notes (Signed)
  Subjective Abdominal pain improving.  Tolerated FLD yesterday with no increase in pain.  Had some crampy pain and diarrhea after grits but with nothing else.  Still with some irritation with voiding, but improved with pyridium  Objective: Vital signs in last 24 hours: Temp:  [97.3 F (36.3 C)-98 F (36.7 C)] 98 F (36.7 C) (08/02 0335) Pulse Rate:  [63-76] 63 (08/02 0335) Resp:  [15-18] 18 (08/02 0335) BP: (96-139)/(45-88) 139/71 (08/02 0335) SpO2:  [97 %-99 %] 98 % (08/02 0335) Last BM Date: 05/13/21  Intake/Output from previous day: 08/01 0701 - 08/02 0700 In: 2516.1 [I.V.:2266.1; IV Piggyback:250] Out: -  Intake/Output this shift: No intake/output data recorded.  Gen: NAD, comfortable, laying in bed CV: RRR Pulm: normal respiratory effort. Abd: Soft, obese, mild focal ttp llq, but improving, +BS   Lab Results: CBC  Recent Labs    05/13/21 0016  WBC 6.5  HGB 13.9  HCT 41.5  PLT 187   BMET Recent Labs    05/12/21 0025 05/13/21 0016  NA 138 139  K 3.7 3.4*  CL 104 105  CO2 26 26  GLUCOSE 86 93  BUN <5* <5*  CREATININE 0.83 0.84  CALCIUM 9.0 9.1   PT/INR No results for input(s): LABPROT, INR in the last 72 hours. ABG No results for input(s): PHART, HCO3 in the last 72 hours.  Invalid input(s): PCO2, PO2  Studies/Results:  Anti-infectives: Anti-infectives (From admission, onward)    Start     Dose/Rate Route Frequency Ordered Stop   05/14/21 1000  amoxicillin-clavulanate (AUGMENTIN) 875-125 MG per tablet 1 tablet        1 tablet Oral Every 12 hours 05/14/21 0747     05/10/21 0600  piperacillin-tazobactam (ZOSYN) IVPB 3.375 g  Status:  Discontinued        3.375 g 12.5 mL/hr over 240 Minutes Intravenous Every 8 hours 05/10/21 0239 05/14/21 0747   05/09/21 1730  ciprofloxacin (CIPRO) IVPB 400 mg        400 mg 200 mL/hr over 60 Minutes Intravenous  Once 05/09/21 1720 05/09/21 1900   05/09/21 1730  metroNIDAZOLE (FLAGYL) IVPB 500 mg        500  mg 100 mL/hr over 60 Minutes Intravenous  Once 05/09/21 1720 05/09/21 1828        Assessment/Plan: diverticulitis and small abscess and possible development of colovesical fistula - not amenable to IR drainage -Continue IV abx - adv to soft diet -Pyridium as she likely has impending/evolving colovesical fistula -Dc zosyn today and transition to oral augmentin.  -FU with Dr. Michaell Cowing -if does well on oral abx therapy and soft diet today, can likely Dc home tomorrow.  D/W primary service.  FEN: soft diet ID: zosyn, transition to oral augmentin 8/2 VTE: heparin sq   LOS: 5 days   Letha [facility] 05/14/2021, 7:50 AM Please see Amion for pager number during day hours 7:00am-4:30pm

## 2021-05-14 NOTE — Progress Notes (Signed)
PROGRESS NOTE    Lori Mcknight  CBU:[id number] DOB: 05-25-1986 DOA: 05/09/2021 PCP: Claiborne Rigg, NP   Chief Complaint  Patient presents with   Abdominal Pain   Urinary Pain    Brief Narrative: 35yof w/ hx of HT, Asthman, hypothyroidism, hx of endometriosis, diverticulitis in 2020 presented with 5 day history of lower abdomen pain. In ED,vitals stable, CT abd showed diverticulitis with developing abscess, was palced on iv antibuiotics, surgery consulted and admitted to [facility] Seen by surgery abscess too small for IR drainage and on medical management. Patient is managed with IV Zosyn pain management IV fluids. Followed by surgery closely diet was slowly advanced, changed to full liquid diet.  Subjective: Seen and examined this morning Some abdominal cramping and full liquid diet yesterday Trying soft diet.  Assessment & Plan:  Diverticulitis of large intestine with small abscess: Surgery following plan is to continue medical management as  abscess not amenable to IR drainage.  Changing to oral antibiotics and soft diet today.  If tolerates hopefully she can go home tomorrow as per surgery. Outpatient colonoscopy in 6 weeks.  Monitor bowel movement for bleeding  Impending/evolving colovesical fistula: Continue Pyreddy.  Essential hypertension:stable on home losartan.  Hypothyroidism: on Synthroid 88 MCG. TSH up at 39- normal ft4.  Consider increasing the dose vs OP follow-up as TSH could be abnormal in the acute illness.  Obesity, morbid, BMI 50 or higher: Patient will benefit with weight loss, healthy lifestyle and PCP follow-up as outpatient.    Hypokalemia-was replaced.   Asthma, mild intermittent: Not in exacerbation.  Constipation -now having diarrhea -discontinued Colace.   Diet Order             DIET SOFT Room service appropriate? Yes; Fluid consistency: Thin  Diet effective now                   Patient's Body mass index is 61.81 kg/m.  DVT prophylaxis:  heparin injection 5,000 Units Start: 05/10/21 1400 Place TED hose Start: 05/10/21 0240.  Add Lovenox for DVT prophylaxis Code Status:   Code Status: Full Code  Family Communication: plan of care discussed with patient at bedside. Status is: Inpatient Remains inpatient appropriate because:IV treatments appropriate due to intensity of illness or inability to take PO and Inpatient level of care appropriate due to severity of illness Dispo: The patient is from: Home              Anticipated d/c is to: Home once okay w/ surgery, and tolerating soft diet at least.               Patient currently is not medically stable to d/c.   Difficult to place patient No Unresulted Labs (From admission, onward)     Start     Ordered   05/17/21 0500  Creatinine, serum  (enoxaparin (LOVENOX)    CrCl >/= 30 ml/min)  Weekly,   R     Comments: while on enoxaparin therapy   Question:  Specimen collection method  Answer:  Lab=Lab collect   05/10/21 1047   05/11/21 0000  CBC  Every 48 hours,   R     Question:  Specimen collection method  Answer:  Lab=Lab collect   05/10/21 1047           Medications reviewed:  Scheduled Meds:  amoxicillin-clavulanate  1 tablet Oral Q12H   docusate sodium  100 mg Oral BID   heparin injection (subcutaneous)  5,000 Units Subcutaneous  Q8H   levothyroxine  88 mcg Oral QAC breakfast   losartan  50 mg Oral Daily   phenazopyridine  100 mg Oral TID WC   Continuous Infusions:  lactated ringers 75 mL/hr at 05/13/21 2057   Consultants:see note  Procedures:see note Antimicrobials: Anti-infectives (From admission, onward)    Start     Dose/Rate Route Frequency Ordered Stop   05/14/21 1000  amoxicillin-clavulanate (AUGMENTIN) 875-125 MG per tablet 1 tablet        1 tablet Oral Every 12 hours 05/14/21 0747     05/10/21 0600  piperacillin-tazobactam (ZOSYN) IVPB 3.375 g  Status:  Discontinued        3.375 g 12.5 mL/hr over 240 Minutes Intravenous Every 8 hours 05/10/21 0239  05/14/21 0747   05/09/21 1730  ciprofloxacin (CIPRO) IVPB 400 mg        400 mg 200 mL/hr over 60 Minutes Intravenous  Once 05/09/21 1720 05/09/21 1900   05/09/21 1730  metroNIDAZOLE (FLAGYL) IVPB 500 mg        500 mg 100 mL/hr over 60 Minutes Intravenous  Once 05/09/21 1720 05/09/21 1828      Culture/Microbiology    Component Value Date/Time   SDES URINE, RANDOM 07/29/2019 1329   SPECREQUEST NONE 07/29/2019 1329   CULT (A) 07/29/2019 1329    <10,000 COLONIES/mL INSIGNIFICANT GROWTH Performed at [facility], [address] [place], [place] [id number]    REPTSTATUS 07/30/2019 FINAL 07/29/2019 1329    Other culture-see note  Objective: Vitals: Today's Vitals   05/13/21 2143 05/13/21 2341 05/14/21 0335 05/14/21 0750  BP:  (!) 96/45 139/71 110/71  Pulse:  68 63 62  Resp:  17 18 19   Temp:  97.8 F (36.6 C) 98 F (36.7 C) 98.1 F (36.7 C)  TempSrc:  Oral Oral Oral  SpO2:  99% 98% 98%  Weight:      Height:      PainSc: 0-No pain   0-No pain    Intake/Output Summary (Last 24 hours) at 05/14/2021 1129 Last data filed at 05/14/2021 [id number] Gross per 24 hour  Intake 2516.14 ml  Output --  Net 2516.14 ml    Filed Weights   05/09/21 1236 05/11/21 0400  Weight: (!) 170.4 kg (!) 173.7 kg   Weight change:   Intake/Output from previous day: 08/01 0701 - 08/02 0700 In: 2516.1 [I.V.:2266.1; IV Piggyback:250] Out: -  Intake/Output this shift: No intake/output data recorded. Filed Weights   05/09/21 1236 05/11/21 0400  Weight: (!) 170.4 kg (!) 173.7 kg   Examination: General exam: AAOx3, morbidly obese pleasant.   HEENT:Oral mucosa moist, Ear/Nose WNL grossly, dentition normal. Respiratory system: bilaterally diminished, no use of accessory muscle Cardiovascular system: S1 & S2 +, No JVD,. Gastrointestinal system: Abdomen soft,NT,ND, BS+ Nervous System:Alert, awake, moving extremities and grossly nonfocal Extremities: no edema, distal peripheral pulses palpable.   Skin: No rashes,no icterus. MSK: Normal muscle bulk,tone, power   Data Reviewed: I have personally reviewed following labs and imaging studies CBC: Recent Labs  Lab 05/08/21 2002 05/09/21 1608 05/10/21 0418 05/11/21 0129 05/13/21 0016  WBC 6.7 6.6 6.0 6.0 6.5  NEUTROABS 3.9  --   --   --   --   HGB 14.6 14.9 13.6 14.3 13.9  HCT 45.1 45.1 41.1 42.4 41.5  MCV 97.0 95.1 96.5 94.4 96.7  PLT 230 195 183 192 187   Basic Metabolic Panel: Recent Labs  Lab 05/08/21 2002 05/09/21 2200 05/10/21 0418 05/11/21 0129 05/12/21 0025  05/13/21 0016  NA 134*  --  137 137 138 139  K 3.3*  --  3.2* 3.3* 3.7 3.4*  CL 104  --  105 103 104 105  CO2 22  --  25 27 26 26   GLUCOSE 99  --  93 101* 86 93  BUN 5*  --  7 <5* <5* <5*  CREATININE 0.85  --  0.82 0.87 0.83 0.84  CALCIUM 8.9  --  8.8* 8.7* 9.0 9.1  MG  --  1.9  --   --   --   --    GFR: Estimated Creatinine Clearance: 153.6 mL/min (by C-G formula based on SCr of 0.84 mg/dL). Liver Function Tests: Recent Labs  Lab 05/08/21 2002  AST 30  ALT 27  ALKPHOS 67  BILITOT 0.2*  PROT 7.6  ALBUMIN 3.8   No results for input(s): LIPASE, AMYLASE in the last 168 hours. No results for input(s): AMMONIA in the last 168 hours. Coagulation Profile: No results for input(s): INR, PROTIME in the last 168 hours. Cardiac Enzymes: No results for input(s): CKTOTAL, CKMB, CKMBINDEX, TROPONINI in the last 168 hours. BNP (last 3 results) No results for input(s): PROBNP in the last 8760 hours. HbA1C: No results for input(s): HGBA1C in the last 72 hours. CBG: No results for input(s): GLUCAP in the last 168 hours. Lipid Profile: No results for input(s): CHOL, HDL, LDLCALC, TRIG, CHOLHDL, LDLDIRECT in the last 72 hours. Thyroid Function Tests: Recent Labs    05/13/21 0725  FREET4 0.67   Anemia Panel: No results for input(s): VITAMINB12, FOLATE, FERRITIN, TIBC, IRON, RETICCTPCT in the last 72 hours. Sepsis Labs: No results for input(s):  PROCALCITON, LATICACIDVEN in the last 168 hours.  Recent Results (from the past 240 hour(s))  Resp Panel by RT-PCR (Flu A&B, Covid) Nasopharyngeal Swab     Status: None   Collection Time: 05/09/21  5:47 PM   Specimen: Nasopharyngeal Swab; Nasopharyngeal(NP) swabs in vial transport medium  Result Value Ref Range Status   SARS Coronavirus 2 by RT PCR NEGATIVE NEGATIVE Final    Comment: (NOTE) SARS-CoV-2 target nucleic acids are NOT DETECTED.  The SARS-CoV-2 RNA is generally detectable in upper respiratory specimens during the acute phase of infection. The lowest concentration of SARS-CoV-2 viral copies this assay can detect is 138 copies/mL. A negative result does not preclude SARS-Cov-2 infection and should not be used as the sole basis for treatment or other patient management decisions. A negative result may occur with  improper specimen collection/handling, submission of specimen other than nasopharyngeal swab, presence of viral mutation(s) within the areas targeted by this assay, and inadequate number of viral copies(<138 copies/mL). A negative result must be combined with clinical observations, patient history, and epidemiological information. The expected result is Negative.  Fact Sheet for Patients:  05/11/21  Fact Sheet for Healthcare Providers:  BloggerCourse.com  This test is no t yet approved or cleared by the [place] FDA and  has been authorized for detection and/or diagnosis of SARS-CoV-2 by FDA under an Emergency Use Authorization (EUA). This EUA will remain  in effect (meaning this test can be used) for the duration of the COVID-19 declaration under Section 564(b)(1) of the Act, 21 U.S.C.section 360bbb-3(b)(1), unless the authorization is terminated  or revoked sooner.       Influenza A by PCR NEGATIVE NEGATIVE Final   Influenza B by PCR NEGATIVE NEGATIVE Final    Comment: (NOTE) The Xpert Xpress  SARS-CoV-2/FLU/RSV plus assay is intended as an  aid in the diagnosis of influenza from Nasopharyngeal swab specimens and should not be used as a sole basis for treatment. Nasal washings and aspirates are unacceptable for Xpert Xpress SARS-CoV-2/FLU/RSV testing.  Fact Sheet for Patients: BloggerCourse.com  Fact Sheet for Healthcare Providers: SeriousBroker.it  This test is not yet approved or cleared by the [place] FDA and has been authorized for detection and/or diagnosis of SARS-CoV-2 by FDA under an Emergency Use Authorization (EUA). This EUA will remain in effect (meaning this test can be used) for the duration of the COVID-19 declaration under Section 564(b)(1) of the Act, 21 U.S.C. section 360bbb-3(b)(1), unless the authorization is terminated or revoked.  Performed at [facility], [address] [place], [place] [address]       Radiology Studies: No results found.   LOS: 5 days   Lanae Boast, MD [facility]  05/14/2021, 11:29 AM

## 2021-05-15 ENCOUNTER — Encounter (HOSPITAL_COMMUNITY): Payer: Self-pay | Admitting: Internal Medicine

## 2021-05-15 ENCOUNTER — Telehealth: Payer: Self-pay | Admitting: Nurse Practitioner

## 2021-05-15 LAB — CBC
HCT: 40.6 % (ref 36.0–46.0)
Hemoglobin: 13.8 g/dL (ref 12.0–15.0)
MCH: 32 pg (ref 26.0–34.0)
MCHC: 34 g/dL (ref 30.0–36.0)
MCV: 94.2 fL (ref 80.0–100.0)
Platelets: 198 10*3/uL (ref 150–400)
RBC: 4.31 MIL/uL (ref 3.87–5.11)
RDW: 12.5 % (ref 11.5–15.5)
WBC: 9.4 10*3/uL (ref 4.0–10.5)
nRBC: 0 % (ref 0.0–0.2)

## 2021-05-15 MED ORDER — AMOXICILLIN-POT CLAVULANATE 875-125 MG PO TABS: 1.0000 | ORAL_TABLET | Freq: Two times a day (BID) | ORAL | 0 refills | Status: AC

## 2021-05-15 MED ORDER — PHENAZOPYRIDINE HCL 100 MG PO TABS: 100.0000 mg | ORAL_TABLET | Freq: Three times a day (TID) | ORAL | 0 refills | Status: AC

## 2021-05-15 MED ORDER — LEVOTHYROXINE SODIUM 88 MCG PO TABS: 88.0000 ug | ORAL_TABLET | Freq: Every day | ORAL | 0 refills | Status: DC

## 2021-05-15 NOTE — Progress Notes (Signed)
Patient familiar to our group for several years.  She has been admitted with recurrent episodes of sigmoid diverticulitis.  I met her in 2019.  I offered surgical resection.  Discussed again in 2020 - 2021.  Surgery has not been scheduled by her.  I think she has been trying to delay this with rounds of PO ABx.  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  If has recurrent symptoms, she may need to stay on antibiotics until surgery can happen.  She needs a colonoscopy to rule out malignancy or other possible etiologies.    Ideally would wait 6 weeks from this current episode to minimize risk of perforation or other issues, colonoscopy, and then colectomy.  Ideally can get bowel prep with colonoscopy the day before and then robotic sigmoid colectomy with immediate anastomosis, just needing 1 bowel prep.  Given the concern of bladder involvement and possible colovesical fistula, I will ask urology to do cystoscopy and firefly ICG bladder/ureteral injection to help define anatomy & minimize risk of any injury.    The challenge is getting her to the point of scheduling so that she can avoid the need for urgent Hartmann resection with many operations to get this under control.  Hopefully there are not in any persistent financial or personal barriers to proceeding with these recommendations given  [name], MD, FACS, MASCRS Esophageal, Gastrointestinal & Colorectal Surgery Robotic and Minimally Invasive Surgery  [facility], [place]  [address] [place], [place] [address] [phone] Fax [phone] Main  CONTACT INFORMATION:  Check [email or web address] ([email or web address]: [email or web address]) for General Surgery [facility] coverage  (Please, do not use SecureChat as it is not  reliable communication to operating surgeons for immediate patient care)

## 2021-05-15 NOTE — Progress Notes (Signed)
  Subjective Looks and feels well today.  Tolerating soft diet with no issues.  Objective: Vital signs in last 24 hours: Temp:  [97.6 F (36.4 C)-98.3 F (36.8 C)] 97.6 F (36.4 C) (08/03 0429) Pulse Rate:  [62-75] 72 (08/03 0429) Resp:  [16-19] 16 (08/03 0429) BP: (92-134)/(55-84) 130/77 (08/03 0429) SpO2:  [97 %-100 %] 97 % (08/03 0429) Last BM Date: 05/14/21  Intake/Output from previous day: 08/02 0701 - 08/03 0700 In: 1438.4 [P.O.:120; I.V.:1318.4] Out: -  Intake/Output this shift: No intake/output data recorded.  Gen: NAD, comfortable, laying in bed CV: RRR Pulm: normal respiratory effort. Abd: Soft, obese, NT today, +BS   Lab Results: CBC  Recent Labs    05/13/21 0016 05/15/21 0021  WBC 6.5 9.4  HGB 13.9 13.8  HCT 41.5 40.6  PLT 187 198   BMET Recent Labs    05/13/21 0016  NA 139  K 3.4*  CL 105  CO2 26  GLUCOSE 93  BUN <5*  CREATININE 0.84  CALCIUM 9.1   PT/INR No results for input(s): LABPROT, INR in the last 72 hours. ABG No results for input(s): PHART, HCO3 in the last 72 hours.  Invalid input(s): PCO2, PO2  Studies/Results:  Anti-infectives: Anti-infectives (From admission, onward)    Start     Dose/Rate Route Frequency Ordered Stop   05/14/21 1000  amoxicillin-clavulanate (AUGMENTIN) 875-125 MG per tablet 1 tablet        1 tablet Oral Every 12 hours 05/14/21 0747     05/10/21 0600  piperacillin-tazobactam (ZOSYN) IVPB 3.375 g  Status:  Discontinued        3.375 g 12.5 mL/hr over 240 Minutes Intravenous Every 8 hours 05/10/21 0239 05/14/21 0747   05/09/21 1730  ciprofloxacin (CIPRO) IVPB 400 mg        400 mg 200 mL/hr over 60 Minutes Intravenous  Once 05/09/21 1720 05/09/21 1900   05/09/21 1730  metroNIDAZOLE (FLAGYL) IVPB 500 mg        500 mg 100 mL/hr over 60 Minutes Intravenous  Once 05/09/21 1720 05/09/21 1828        Assessment/Plan: diverticulitis and small abscess and 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 Michaell Cowing -surgically stable for DC home on oral abx therapy for a total of 14 days, so 8 more days.  FEN: soft diet ID: zosyn, transition to oral augmentin 8/2 VTE: heparin sq   LOS: 6 days   [facility] 05/15/2021, 7:45 AM Please see Amion for pager number during day hours 7:00am-4:30pm

## 2021-05-15 NOTE — Discharge Summary (Signed)
PatientPhysician Discharge Summary  Lori Mcknight GYK:[id number] DOB: 05-13-1986 DOA: 05/09/2021  PCP: Claiborne Rigg, NP  Admit date: 05/09/2021 Discharge date: 05/15/2021  Admitted From: home Disposition:  home  Recommendations for Outpatient Follow-up:  Follow up with PCP and Dr Michaell Cowing as instructed/ in 1-2 weeks You will need to have colonoscopy in 6 weeks-follow-up with GI  Home Health:no  Equipment/Devices: none  Discharge Condition: Stable Code Status:   Code Status: Full Code Diet recommendation:  Diet Order             DIET SOFT Room service appropriate? Yes; Fluid consistency: Thin  Diet effective now                    Brief/Interim Summary: 35yof w/ hx of HT, [place], hypothyroidism, hx of endometriosis, diverticulitis in 2020 presented with 5 day history of lower abdomen pain. In ED,vitals stable, CT abd showed diverticulitis with developing abscess, was palced on iv antibuiotics, surgery consulted and admitted to [facility] Seen by surgery abscess too small for IR drainage and on medical management. Patient is managed with IV Zosyn pain management IV fluids. Followed by surgery closely diet was slowly advanced.  C tolerated diet well at this time normal troponin, changed to oral antibiotic and soft diet 8/2 did well overnight, cleared for discharge by surgery with  with outpatient follow-up lives with  Discharge Diagnoses:  Diverticulitis of large intestine with small abscess: She was managed  conservatively improved tolerated soft diet and oral antibiotics 8/2.  She will go home with oral Augmentin and follow-up with surgery as outpatient.  .Outpatient colonoscopy in 6 weeks-has been advised.   Impending/evolving colovesical fistula: Continue Pyreddy.   Essential hypertension:stable on home losartan.   Hypothyroidism: cont Synthroid 88-.TSH up at 39- normal ft4.  It seems she had not picked up for 88 MCG Synthroid .Tsh check I n3-5 wks   Obesity, morbid, BMI 50 or  higher: Patient will benefit with weight loss, healthy lifestyle and PCP follow-up as outpatient.     Hypokalemia-stable.   Asthma, mild intermittent: Not in exacerbation. Constipation -resolved  Consults: surgery  Subjective: Alert awake oriented, no abdominal pain, chest pain, tolerating diet no nausea or vomiting.  Happy to go home today Discharge Exam: Vitals:   05/15/21 0429 05/15/21 0804  BP: 130/77 117/69  Pulse: 72 67  Resp: 16 19  Temp: 97.6 F (36.4 C) 97.9 F (36.6 C)  SpO2: 97% 99%   General: Pt is alert, awake, not in acute distress Cardiovascular: RRR, S1/S2 +, no rubs, no gallops Respiratory: CTA bilaterally, no wheezing, no rhonchi Abdominal: Soft, NT, ND, bowel sounds + Extremities: no edema, no cyanosis  Discharge Instructions  Discharge Instructions     Discharge instructions   Complete by: As directed    Follow-up with surgery Dr. Michaell Cowing as instructed.  He will need to have colonoscopy in 6 weeks to rule out other pathology.  Please call call MD or return to ER for similar or worsening recurring problem that brought you to hospital or if any fever,nausea/vomiting,abdominal pain, uncontrolled pain, chest pain,  shortness of breath or any other alarming symptoms.  Please follow-up your doctor as instructed in a week time and call the office for appointment.  Please avoid alcohol, smoking, or any other illicit substance and maintain healthy habits including taking your regular medications as prescribed.  You were cared for by a hospitalist during your hospital stay. If you have any questions about your discharge medications  or the care you received while you were in the hospital after you are discharged, you can call the unit and ask to speak with the hospitalist on call if the hospitalist that took care of you is not available.  Once you are discharged, your primary care physician will handle any further medical issues. Please note that NO REFILLS for any  discharge medications will be authorized once you are discharged, as it is imperative that you return to your primary care physician (or establish a relationship with a primary care physician if you do not have one) for your aftercare needs so that they can reassess your need for medications and monitor your lab values   Increase activity slowly   Complete by: As directed       Allergies as of 05/15/2021       Reactions   Dilaudid [hydromorphone] Rash   Itching all over body        Medication List     TAKE these medications    acetaminophen 325 MG tablet Commonly known as: TYLENOL Take 2 tablets (650 mg total) by mouth every 6 (six) hours as needed for mild pain or fever.   albuterol 108 (90 Base) MCG/ACT inhaler Commonly known as: VENTOLIN HFA Inhale 2 puffs into the lungs every 6 (six) hours as needed for wheezing or shortness of breath.   amoxicillin-clavulanate 875-125 MG tablet Commonly known as: AUGMENTIN Take 1 tablet by mouth every 12 (twelve) hours for 8 days.   cyclobenzaprine 10 MG tablet Commonly known as: FLEXERIL Take 1 tablet (10 mg total) by mouth at bedtime.   gabapentin 300 MG capsule Commonly known as: NEURONTIN Take 1 capsule (300 mg total) by mouth 2 (two) times daily.   ibuprofen 200 MG tablet Commonly known as: ADVIL Take 800 mg by mouth every 6 (six) hours as needed for moderate pain.   levothyroxine 88 MCG tablet Commonly known as: SYNTHROID Take 1 tablet (88 mcg total) by mouth daily before breakfast.   phenazopyridine 100 MG tablet Commonly known as: PYRIDIUM Take 1 tablet (100 mg total) by mouth 3 (three) times daily with meals.       ASK your doctor about these medications    medroxyPROGESTERone 10 MG tablet Commonly known as: PROVERA Take 1 tablet (10 mg total) by mouth daily.        Follow-up Information     Karie SodaGross, Steven, MD Follow up on 06/10/2021.   Specialties: General Surgery, Colon and Rectal Surgery Why: arrive at  11:00am for check in process Contact information: [address] [place] [address] [phone]                Allergies  Allergen Reactions   Dilaudid [Hydromorphone] Rash    Itching all over body    The results of significant diagnostics from this hospitalization (including imaging, microbiology, ancillary and laboratory) are listed below for reference.    Microbiology: Recent Results (from the past 240 hour(s))  Resp Panel by RT-PCR (Flu A&B, Covid) Nasopharyngeal Swab     Status: None   Collection Time: 05/09/21  5:47 PM   Specimen: Nasopharyngeal Swab; Nasopharyngeal(NP) swabs in vial transport medium  Result Value Ref Range Status   SARS Coronavirus 2 by RT PCR NEGATIVE NEGATIVE Final    Comment: (NOTE) SARS-CoV-2 target nucleic acids are NOT DETECTED.  The SARS-CoV-2 RNA is generally detectable in upper respiratory specimens during the acute phase of infection. The lowest concentration of SARS-CoV-2 viral copies  this assay can detect is 138 copies/mL. A negative result does not preclude SARS-Cov-2 infection and should not be used as the sole basis for treatment or other patient management decisions. A negative result may occur with  improper specimen collection/handling, submission of specimen other than nasopharyngeal swab, presence of viral mutation(s) within the areas targeted by this assay, and inadequate number of viral copies(<138 copies/mL). A negative result must be combined with clinical observations, patient history, and epidemiological information. The expected result is Negative.  Fact Sheet for Patients:  BloggerCourse.com  Fact Sheet for Healthcare Providers:  SeriousBroker.it  This test is no t yet approved or cleared by the [place] FDA and  has been authorized for detection and/or diagnosis of SARS-CoV-2 by FDA under an Emergency Use Authorization (EUA). This EUA will  remain  in effect (meaning this test can be used) for the duration of the COVID-19 declaration under Section 564(b)(1) of the Act, 21 U.S.C.section 360bbb-3(b)(1), unless the authorization is terminated  or revoked sooner.       Influenza A by PCR NEGATIVE NEGATIVE Final   Influenza B by PCR NEGATIVE NEGATIVE Final    Comment: (NOTE) The Xpert Xpress SARS-CoV-2/FLU/RSV plus assay is intended as an aid in the diagnosis of influenza from Nasopharyngeal swab specimens and should not be used as a sole basis for treatment. Nasal washings and aspirates are unacceptable for Xpert Xpress SARS-CoV-2/FLU/RSV testing.  Fact Sheet for Patients: BloggerCourse.com  Fact Sheet for Healthcare Providers: SeriousBroker.it  This test is not yet approved or cleared by the [place] FDA and has been authorized for detection and/or diagnosis of SARS-CoV-2 by FDA under an Emergency Use Authorization (EUA). This EUA will remain in effect (meaning this test can be used) for the duration of the COVID-19 declaration under Section 564(b)(1) of the Act, 21 U.S.C. section 360bbb-3(b)(1), unless the authorization is terminated or revoked.  Performed at [facility], [address] [place], [place] [address]     Procedures/Studies: CT Abdomen Pelvis W Contrast  Result Date: 05/09/2021 CLINICAL DATA:  35 year old female with concern for acute diverticulitis. EXAM: CT ABDOMEN AND PELVIS WITH CONTRAST TECHNIQUE: Multidetector CT imaging of the abdomen and pelvis was performed using the standard protocol following bolus administration of intravenous contrast. CONTRAST:  OMNIPAQUE IOHEXOL 350 MG/ML SOLN COMPARISON:  CT abdomen pelvis dated 04/10/2020. FINDINGS: Lower chest: The visualized lung bases are clear. No intra-abdominal free air. Small free fluid within the pelvis. Hepatobiliary: Fatty liver. No intrahepatic biliary ductal  dilatation. The gallbladder is unremarkable. Pancreas: Unremarkable. No pancreatic ductal dilatation or surrounding inflammatory changes. Spleen: Normal in size without focal abnormality. Adrenals/Urinary Tract: The adrenal glands are unremarkable. The kidneys, and the visualized ureters appear unremarkable. The urinary bladder is minimally distended. There is thickened appearance of the bladder wall with perivesical stranding likely reactive to inflammatory changes of the sigmoid colon. There is a 2.7 x 1.8 cm loculated fluid protruding to the left bladder dome in similar location as the prior CT. This likely represents a developing abscess of the sigmoid diverticular disease. Developing collection in similar location suggests possible fistulous tract formation. There is loss of fat plane between the left bladder dome and sigmoid colon suggestive of adhesion. No air however is identified within the bladder to suggest a definite complete colovesical fistula at this time. Stomach/Bowel: There is sigmoid diverticulitis. Small fluid collection as described above protruding into the left bladder dome most consistent with developing diverticular abscess. There is no bowel obstruction. The  appendix is normal. Vascular/Lymphatic: The abdominal aorta and IVC unremarkable. No portal venous gas. There is no adenopathy. Reproductive: The uterus is grossly unremarkable. No adnexal masses. Other: None Musculoskeletal: Degenerative changes primarily at L5-S1. No acute osseous pathology. IMPRESSION: 1. Sigmoid diverticulitis with a developing diverticular abscess protruding into the left bladder dome. Findings suggestive of colovesical adhesion with possible developing fistulous tract. No air is identified within the bladder to suggest a definite complete colovesical fistula at this time. 2. Fatty liver. 3. No bowel obstruction. Normal appendix. Electronically Signed   By: Elgie Collard M.D.   On: 05/09/2021 16:51     Labs: BNP (last 3 results) No results for input(s): BNP in the last 8760 hours. Basic Metabolic Panel: Recent Labs  Lab 05/08/21 2002 05/09/21 2200 05/10/21 0418 05/11/21 0129 05/12/21 0025 05/13/21 0016  NA 134*  --  137 137 138 139  K 3.3*  --  3.2* 3.3* 3.7 3.4*  CL 104  --  105 103 104 105  CO2 22  --  25 27 26 26   GLUCOSE 99  --  93 101* 86 93  BUN 5*  --  7 <5* <5* <5*  CREATININE 0.85  --  0.82 0.87 0.83 0.84  CALCIUM 8.9  --  8.8* 8.7* 9.0 9.1  MG  --  1.9  --   --   --   --    Liver Function Tests: Recent Labs  Lab 05/08/21 2002  AST 30  ALT 27  ALKPHOS 67  BILITOT 0.2*  PROT 7.6  ALBUMIN 3.8   No results for input(s): LIPASE, AMYLASE in the last 168 hours. No results for input(s): AMMONIA in the last 168 hours. CBC: Recent Labs  Lab 05/08/21 2002 05/09/21 1608 05/10/21 0418 05/11/21 0129 05/13/21 0016 05/15/21 0021  WBC 6.7 6.6 6.0 6.0 6.5 9.4  NEUTROABS 3.9  --   --   --   --   --   HGB 14.6 14.9 13.6 14.3 13.9 13.8  HCT 45.1 45.1 41.1 42.4 41.5 40.6  MCV 97.0 95.1 96.5 94.4 96.7 94.2  PLT 230 195 183 192 187 198   Cardiac Enzymes: No results for input(s): CKTOTAL, CKMB, CKMBINDEX, TROPONINI in the last 168 hours. BNP: Invalid input(s): POCBNP CBG: No results for input(s): GLUCAP in the last 168 hours. D-Dimer No results for input(s): DDIMER in the last 72 hours. Hgb A1c No results for input(s): HGBA1C in the last 72 hours. Lipid Profile No results for input(s): CHOL, HDL, LDLCALC, TRIG, CHOLHDL, LDLDIRECT in the last 72 hours. Thyroid function studies No results for input(s): TSH, T4TOTAL, T3FREE, THYROIDAB in the last 72 hours.  Invalid input(s): FREET3 Anemia work up No results for input(s): VITAMINB12, FOLATE, FERRITIN, TIBC, IRON, RETICCTPCT in the last 72 hours. Urinalysis    Component Value Date/Time   COLORURINE AMBER (A) 05/08/2021 1958   APPEARANCEUR HAZY (A) 05/08/2021 1958   APPEARANCEUR Clear 03/08/2020 1515    LABSPEC 1.030 05/08/2021 1958   PHURINE 5.0 05/08/2021 1958   GLUCOSEU NEGATIVE 05/08/2021 1958   HGBUR NEGATIVE 05/08/2021 1958   BILIRUBINUR NEGATIVE 05/08/2021 1958   BILIRUBINUR Negative 03/08/2020 1515   KETONESUR 5 (A) 05/08/2021 1958   PROTEINUR 100 (A) 05/08/2021 1958   UROBILINOGEN 0.2 07/29/2019 1321   NITRITE NEGATIVE 05/08/2021 1958   LEUKOCYTESUR NEGATIVE 05/08/2021 1958   Sepsis Labs Invalid input(s): PROCALCITONIN,  WBC,  LACTICIDVEN Microbiology Recent Results (from the past 240 hour(s))  Resp Panel by RT-PCR (Flu A&B, Covid)  Nasopharyngeal Swab     Status: None   Collection Time: 05/09/21  5:47 PM   Specimen: Nasopharyngeal Swab; Nasopharyngeal(NP) swabs in vial transport medium  Result Value Ref Range Status   SARS Coronavirus 2 by RT PCR NEGATIVE NEGATIVE Final    Comment: (NOTE) SARS-CoV-2 target nucleic acids are NOT DETECTED.  The SARS-CoV-2 RNA is generally detectable in upper respiratory specimens during the acute phase of infection. The lowest concentration of SARS-CoV-2 viral copies this assay can detect is 138 copies/mL. A negative result does not preclude SARS-Cov-2 infection and should not be used as the sole basis for treatment or other patient management decisions. A negative result may occur with  improper specimen collection/handling, submission of specimen other than nasopharyngeal swab, presence of viral mutation(s) within the areas targeted by this assay, and inadequate number of viral copies(<138 copies/mL). A negative result must be combined with clinical observations, patient history, and epidemiological information. The expected result is Negative.  Fact Sheet for Patients:  BloggerCourse.com  Fact Sheet for Healthcare Providers:  SeriousBroker.it  This test is no t yet approved or cleared by the [place] FDA and  has been authorized for detection and/or diagnosis of SARS-CoV-2  by FDA under an Emergency Use Authorization (EUA). This EUA will remain  in effect (meaning this test can be used) for the duration of the COVID-19 declaration under Section 564(b)(1) of the Act, 21 U.S.C.section 360bbb-3(b)(1), unless the authorization is terminated  or revoked sooner.       Influenza A by PCR NEGATIVE NEGATIVE Final   Influenza B by PCR NEGATIVE NEGATIVE Final    Comment: (NOTE) The Xpert Xpress SARS-CoV-2/FLU/RSV plus assay is intended as an aid in the diagnosis of influenza from Nasopharyngeal swab specimens and should not be used as a sole basis for treatment. Nasal washings and aspirates are unacceptable for Xpert Xpress SARS-CoV-2/FLU/RSV testing.  Fact Sheet for Patients: BloggerCourse.com  Fact Sheet for Healthcare Providers: SeriousBroker.it  This test is not yet approved or cleared by the [place] FDA and has been authorized for detection and/or diagnosis of SARS-CoV-2 by FDA under an Emergency Use Authorization (EUA). This EUA will remain in effect (meaning this test can be used) for the duration of the COVID-19 declaration under Section 564(b)(1) of the Act, 21 U.S.C. section 360bbb-3(b)(1), unless the authorization is terminated or revoked.  Performed at [facility], [address], [place], [place] [address]      Time coordinating discharge: 25 minutes  SIGNED: Lanae Boast, MD  [facility] 05/15/2021, 9:26 AM  If 7PM-7AM, please contact night-coverage [email or web address]

## 2021-05-15 NOTE — Care Management (Signed)
Consult for insurance challenges to get future colectomy covered.  Patient is uninsured. Email sent to financial counselor to assist with medicaid application, and FC responded that it is in progress.   Patient follows at [facility], where she can also get assistance with a medicaid application.   Per documentation there is communication btwn [facility] and [facility] to have procedures scheduled. Further insurance inquiries will need to be handled by outpatient offices scheduling procedures.

## 2021-05-15 NOTE — Plan of Care (Signed)

## 2021-05-15 NOTE — Progress Notes (Signed)
Discharge Home. Home discharge instruction given, no question verbalized. 

## 2021-05-15 NOTE — Telephone Encounter (Signed)
Lori Mcknight with [facility] is calling and the patient has orange card and would like Lori Mcknight to put in a referral to [facility] for the patient to have a colonoscopy coordination  with surgery to avoid pt having to do separate bowl prep. Pt is at [facility] hopefully will be discharged today. Pt dad had colon cancer in his 34's

## 2021-05-15 NOTE — Progress Notes (Signed)
   05/15/21 1157  Clinical Encounter Type  Visited With Patient not available  Visit Type Initial  Referral From Nurse  Consult/Referral To Chaplain   Chaplain responded to consult for Advanced Directive. Pt was not the in room, so Chaplain left paperwork on the bedside table. Please reach out if Pt has any questions and if/when paperwork is ready to be notarized. Chaplain remains available.   This note was prepared by Chaplain Resident, Tacy Learn, MDiv. Chaplain remains available as needed through the on-call pager: [phone].

## 2021-05-15 NOTE — Telephone Encounter (Signed)
Pt is requesting Colonoscopy.

## 2021-05-16 ENCOUNTER — Telehealth: Payer: Self-pay

## 2021-05-16 NOTE — Telephone Encounter (Signed)
Transition Care Management Follow-up Telephone Call Date of discharge and from where: 05/15/2021, [facility]  How have you been since you were released from the hospital? She said she is feeling a little better Any questions or concerns? Yes - She needs a referral for a colonoscopy.   Items Reviewed: Did the pt receive and understand the discharge instructions provided? Yes  Medications obtained and verified? Yes  - she has all medications except the pyridium.  She said that the pharmacy did not have any in stock yet.  She didn't have any questions about the med regime.  Other? No  Any new allergies since your discharge? No  Do you have support at home? Yes   Home Care and Equipment/Supplies: Were home health services ordered? no If so, what is the name of the agency? N/a  Has the agency set up a time to come to the patient's home? not applicable Were any new equipment or medical supplies ordered?  No What is the name of the medical supply agency? N/a Were you able to get the supplies/equipment? not applicable Do you have any questions related to the use of the equipment or supplies? No  Functional Questionnaire: (I = Independent and D = Dependent) ADLs: independent  Follow up appointments reviewed:  PCP Hospital f/u appt confirmed? Yes  Scheduled to see Bertram Denver, NP on 05/28/2021  @ 1050. Specialist Hospital f/u appt confirmed?  She is waiting to schedule the appointment with the surgeon until she has a colonoscopy scheduled   Are transportation arrangements needed? No  If their condition worsens, is the pt aware to call PCP or go to the Emergency Dept.? Yes Was the patient provided with contact information for the PCP's office or ED? Yes Was to pt encouraged to call back with questions or concerns? Yes

## 2021-05-17 ENCOUNTER — Other Ambulatory Visit: Payer: Self-pay | Admitting: Nurse Practitioner

## 2021-05-17 DIAGNOSIS — Z1211 Encounter for screening for malignant neoplasm of colon: Secondary | ICD-10-CM

## 2021-05-17 DIAGNOSIS — K5792 Diverticulitis of intestine, part unspecified, without perforation or abscess without bleeding: Secondary | ICD-10-CM

## 2021-05-20 ENCOUNTER — Encounter (HOSPITAL_BASED_OUTPATIENT_CLINIC_OR_DEPARTMENT_OTHER): Payer: Self-pay | Admitting: Emergency Medicine

## 2021-05-20 ENCOUNTER — Emergency Department (HOSPITAL_BASED_OUTPATIENT_CLINIC_OR_DEPARTMENT_OTHER): Payer: Self-pay

## 2021-05-20 ENCOUNTER — Emergency Department (HOSPITAL_BASED_OUTPATIENT_CLINIC_OR_DEPARTMENT_OTHER)
Admission: EM | Admit: 2021-05-20 | Discharge: 2021-05-20 | Disposition: A | Discharge status: Home / Self Care | Payer: Self-pay | Attending: Emergency Medicine | Admitting: Emergency Medicine

## 2021-05-20 ENCOUNTER — Other Ambulatory Visit: Payer: Self-pay

## 2021-05-20 DIAGNOSIS — R197 Diarrhea, unspecified: Secondary | ICD-10-CM | POA: Insufficient documentation

## 2021-05-20 DIAGNOSIS — R1032 Left lower quadrant pain: Secondary | ICD-10-CM | POA: Insufficient documentation

## 2021-05-20 DIAGNOSIS — R110 Nausea: Secondary | ICD-10-CM | POA: Insufficient documentation

## 2021-05-20 DIAGNOSIS — I10 Essential (primary) hypertension: Secondary | ICD-10-CM | POA: Insufficient documentation

## 2021-05-20 DIAGNOSIS — E039 Hypothyroidism, unspecified: Secondary | ICD-10-CM | POA: Insufficient documentation

## 2021-05-20 DIAGNOSIS — F17210 Nicotine dependence, cigarettes, uncomplicated: Secondary | ICD-10-CM | POA: Insufficient documentation

## 2021-05-20 DIAGNOSIS — K5792 Diverticulitis of intestine, part unspecified, without perforation or abscess without bleeding: Secondary | ICD-10-CM

## 2021-05-20 DIAGNOSIS — N9489 Other specified conditions associated with female genital organs and menstrual cycle: Secondary | ICD-10-CM | POA: Insufficient documentation

## 2021-05-20 DIAGNOSIS — J4520 Mild intermittent asthma, uncomplicated: Secondary | ICD-10-CM | POA: Insufficient documentation

## 2021-05-20 DIAGNOSIS — Z79899 Other long term (current) drug therapy: Secondary | ICD-10-CM | POA: Insufficient documentation

## 2021-05-20 LAB — CBC WITH DIFFERENTIAL/PLATELET
Abs Immature Granulocytes: 0.01 10*3/uL (ref 0.00–0.07)
Basophils Absolute: 0 10*3/uL (ref 0.0–0.1)
Basophils Relative: 1 %
Eosinophils Absolute: 0 10*3/uL (ref 0.0–0.5)
Eosinophils Relative: 0 %
HCT: 44.7 % (ref 36.0–46.0)
Hemoglobin: 14.5 g/dL (ref 12.0–15.0)
Immature Granulocytes: 0 %
Lymphocytes Relative: 32 %
Lymphs Abs: 1.5 10*3/uL (ref 0.7–4.0)
MCH: 30.7 pg (ref 26.0–34.0)
MCHC: 32.4 g/dL (ref 30.0–36.0)
MCV: 94.7 fL (ref 80.0–100.0)
Monocytes Absolute: 0.5 10*3/uL (ref 0.1–1.0)
Monocytes Relative: 11 %
Neutro Abs: 2.6 10*3/uL (ref 1.7–7.7)
Neutrophils Relative %: 56 %
Platelets: 181 10*3/uL (ref 150–400)
RBC: 4.72 MIL/uL (ref 3.87–5.11)
RDW: 12.7 % (ref 11.5–15.5)
WBC: 4.6 10*3/uL (ref 4.0–10.5)
nRBC: 0 % (ref 0.0–0.2)

## 2021-05-20 LAB — URINALYSIS, ROUTINE W REFLEX MICROSCOPIC
Bilirubin Urine: NEGATIVE
Glucose, UA: NEGATIVE mg/dL
Hgb urine dipstick: NEGATIVE
Ketones, ur: NEGATIVE mg/dL
Leukocytes,Ua: NEGATIVE
Nitrite: NEGATIVE
Protein, ur: NEGATIVE mg/dL
Specific Gravity, Urine: 1.013 (ref 1.005–1.030)
pH: 6.5 (ref 5.0–8.0)

## 2021-05-20 LAB — HCG, QUANTITATIVE, PREGNANCY: hCG, Beta Chain, Quant, S: <1 m[IU]/mL (ref <5)

## 2021-05-20 LAB — COMPREHENSIVE METABOLIC PANEL
ALT: 39 U/L (ref 0–44)
AST: 29 U/L (ref 15–41)
Albumin: 3.9 g/dL (ref 3.5–5.0)
Alkaline Phosphatase: 59 U/L (ref 38–126)
Anion gap: 9 (ref 5–15)
BUN: 9 mg/dL (ref 6–20)
CO2: 23 mmol/L (ref 22–32)
Calcium: 8.5 mg/dL — ABNORMAL LOW (ref 8.9–10.3)
Chloride: 105 mmol/L (ref 98–111)
Creatinine, Ser: 0.71 mg/dL (ref 0.44–1.00)
GFR, Estimated: >60 mL/min (ref >60)
Glucose, Bld: 129 mg/dL — ABNORMAL HIGH (ref 70–99)
Potassium: 3.5 mmol/L (ref 3.5–5.1)
Sodium: 137 mmol/L (ref 135–145)
Total Bilirubin: 0.3 mg/dL (ref 0.3–1.2)
Total Protein: 7.6 g/dL (ref 6.5–8.1)

## 2021-05-20 MED ORDER — IOHEXOL 350 MG/ML SOLN
100.0000 mL | Freq: Once | INTRAVENOUS | Status: AC | PRN
  Administered 2021-05-20: 100 mL via INTRAVENOUS

## 2021-05-20 MED ORDER — MORPHINE SULFATE (PF) 4 MG/ML IV SOLN
4.0000 mg | Freq: Once | INTRAVENOUS | Status: AC
  Administered 2021-05-20: 4 mg via INTRAVENOUS
  Filled 2021-05-20: qty 1

## 2021-05-20 MED ORDER — ONDANSETRON HCL 4 MG/2ML IJ SOLN
4.0000 mg | Freq: Once | INTRAMUSCULAR | Status: AC
  Administered 2021-05-20: 4 mg via INTRAVENOUS
  Filled 2021-05-20: qty 2

## 2021-05-20 MED ORDER — OXYCODONE HCL 5 MG PO TABS: 5.0000 mg | ORAL_TABLET | Freq: Four times a day (QID) | ORAL | 0 refills | Status: DC | PRN

## 2021-05-20 NOTE — ED Provider Notes (Signed)
[facility] EMERGENCY DEPT Provider Note   CSN: [id number] Arrival date & time: 05/20/21  5397     History Chief Complaint  Patient presents with   Abdominal Pain    Lori Mcknight is a 35 y.o. female.  HPI     This is a 35 year old female with a history of anemia, asthma, diverticulitis, hypertension, obesity who presents with abdominal pain.  Patient was just admitted to the hospital in late July for diverticulitis with a small abscess.  It was too small to be drained.  She was placed on IV antibiotics and discharged on Augmentin.  She had been well pain controlled but yesterday had recurrence of pain.  She reports sharp pain in the left and mid abdomen.  It is constant.  She rates pain at 10 out of 10.  She is not taking anything for pain.  She continues to take Augmentin.  She reports nausea without vomiting.  She states she had diarrhea but has not had a bowel movement in last 24 hours.  Denies fevers.  Past Medical History:  Diagnosis Date   Anemia    history of anemia   Anxiety    Asthma    inhaler as needed   Chest pain of uncertain etiology    intermittent left side chest pain   Chlamydia    Depression    Diverticulitis 2019   Endometriosis    Genital herpes    Headache    MIGRAINES   Hypertension    no meds since April   Hypothyroidism    Obese    Obesity, Class III, BMI 40-49.9 (morbid obesity) (HCC) 11/06/2011   PCOS (polycystic ovarian syndrome)    PONV (postoperative nausea and vomiting)    Shortness of breath    on exertion from weight    Patient Active Problem List   Diagnosis Date Noted   Generalized abdominal pain 05/09/2021   Diverticulitis of large intestine with abscess 05/09/2021   Hypokalemia 05/09/2021   Asthma, mild intermittent 05/09/2021   Diverticulitis 03/26/2020   Obesity, morbid, BMI 50 or higher (HCC) 08/08/2019   Endometriosis s/p ablation 08/08/2019   Dysuria 08/08/2019   Family history of colon cancer in father  08/08/2019   Acute diverticulitis 08/07/2019   Incomplete bladder emptying 02/18/2018   DUB (dysfunctional uterine bleeding) 02/18/2018   Anemia 02/18/2018   Hypothyroid 01/29/2017   Migraines 08/26/2016   Chest pain 02/04/2016   Abnormal uterine bleeding (AUB) 01/09/2014   Thickened endometrium 12/02/2013   Essential hypertension 10/21/2013   PCOS (polycystic ovarian syndrome) 11/06/2011   Infertility, female 11/06/2011   Pelvic pain in female 08/01/2011    Past Surgical History:  Procedure Laterality Date   DILATION AND CURETTAGE OF UTERUS N/A 04/29/2018   Procedure: DILATATION AND CURETTAGE;  Surgeon: Allie Bossier, MD;  Location: [facility];  Service: Gynecology;  Laterality: N/A;   HYSTEROSCOPY WITH D & C N/A 04/06/2014   Procedure: DILATATION AND CURETTAGE /HYSTEROSCOPY ;  Surgeon: Tereso Newcomer, MD;  Location: [facility];  Service: Gynecology;  Laterality: N/A;   LAPAROSCOPY N/A 04/29/2018   Procedure: LAPAROSCOPY DIAGNOSTIC WITH PERITONEAL BIOPSY;  Surgeon: Allie Bossier, MD;  Location: [facility];  Service: Gynecology;  Laterality: N/A;   LEFT HEART CATH AND CORONARY ANGIOGRAPHY N/A 12/12/2016   Procedure: Left Heart Cath and Coronary Angiography;  Surgeon: Peter M Swaziland, MD;  Location[facility] INVASIVE CV LAB;  Service: Cardiovascular;  Laterality: N/A;   WISDOM TOOTH EXTRACTION  OB History     Gravida  0   Para  0   Term  0   Preterm  0   AB  0   Living  0      SAB  0   IAB  0   Ectopic  0   Multiple  0   Live Births  0           Family History  Problem Relation Age of Onset   Heart disease Mother    Sleep apnea Mother    Depression Mother    Hypertension Mother    Kidney disease Mother    Cancer Father    Diabetes Father    Heart disease Father    Colon cancer Father        25s   Hypertension Father    Diabetes Sister    Hypertension Sister    Heart disease Maternal Grandmother    Diabetes Maternal Grandfather    Heart disease Maternal  Grandfather    Colon cancer Cousin        died age 36, paternal cousin    Social History   Tobacco Use   Smoking status: Some Days    Packs/day: 0.25    Types: Cigarettes   Smokeless tobacco: Never   Tobacco comments:    2-3 cigerettes a week.  Vaping Use   Vaping Use: Never used  Substance Use Topics   Alcohol use: Not Currently    Alcohol/week: 0.0 standard drinks    Comment: occasonal    Drug use: Not Currently    Types: Marijuana    Comment: last use 3 months ago    Home Medications Prior to Admission medications   Medication Sig Start Date End Date Taking? Authorizing Provider  acetaminophen (TYLENOL) 325 MG tablet Take 2 tablets (650 mg total) by mouth every 6 (six) hours as needed for mild pain or fever. 04/01/20   Lonia Blood, MD  albuterol (PROVENTIL HFA;VENTOLIN HFA) 108 (90 Base) MCG/ACT inhaler Inhale 2 puffs into the lungs every 6 (six) hours as needed for wheezing or shortness of breath. 01/19/18   Claiborne Rigg, NP  amoxicillin-clavulanate (AUGMENTIN) 875-125 MG tablet Take 1 tablet by mouth every 12 (twelve) hours for 8 days. 05/15/21 05/23/21  Barnetta Chapel, PA-C  cyclobenzaprine (FLEXERIL) 10 MG tablet Take 1 tablet (10 mg total) by mouth at bedtime. 05/06/21   Claiborne Rigg, NP  gabapentin (NEURONTIN) 300 MG capsule Take 1 capsule (300 mg total) by mouth 2 (two) times daily. 08/28/20   Rema Fendt, NP  ibuprofen (ADVIL) 200 MG tablet Take 800 mg by mouth every 6 (six) hours as needed for moderate pain.    [provider]  levothyroxine (SYNTHROID) 88 MCG tablet Take 1 tablet (88 mcg total) by mouth daily before breakfast. 05/15/21 06/14/21  Lanae Boast, MD  medroxyPROGESTERone (PROVERA) 10 MG tablet Take 1 tablet (10 mg total) by mouth daily. Patient not taking: No sig reported 11/19/20   Jerene Bears, MD  phenazopyridine (PYRIDIUM) 100 MG tablet Take 1 tablet (100 mg total) by mouth 3 (three) times daily with meals. 05/15/21 06/14/21  Barnetta Chapel, PA-C    Allergies    Dilaudid [hydromorphone]  Review of Systems   Review of Systems  Constitutional:  Negative for fever.  Respiratory:  Negative for shortness of breath.   Cardiovascular:  Negative for chest pain.  Gastrointestinal:  Positive for abdominal pain, diarrhea and nausea. Negative for vomiting.  Genitourinary:  Negative for dysuria.  All other systems reviewed and are negative.  Physical Exam Updated Vital Signs BP (!) 144/100   Pulse 84   Temp 98.5 F (36.9 C) (Oral)   Resp 16   Ht 1.676 m (5\' 6" )   Wt (!) 173.7 kg   SpO2 94%   BMI 61.81 kg/m   Physical Exam Vitals and nursing note reviewed.  Constitutional:      Appearance: She is well-developed. She is obese.  HENT:     Head: Normocephalic and atraumatic.  Eyes:     Pupils: Pupils are equal, round, and reactive to light.  Cardiovascular:     Rate and Rhythm: Normal rate and regular rhythm.     Heart sounds: Normal heart sounds.  Pulmonary:     Effort: Pulmonary effort is normal. No respiratory distress.     Breath sounds: No wheezing.  Abdominal:     General: Bowel sounds are normal.     Palpations: Abdomen is soft.     Tenderness: There is abdominal tenderness in the periumbilical area and left lower quadrant. There is no guarding or rebound.     Hernia: No hernia is present.  Musculoskeletal:     Cervical back: Neck supple.  Skin:    General: Skin is warm and dry.  Neurological:     Mental Status: She is alert and oriented to person, place, and time.  Psychiatric:        Mood and Affect: Mood normal.    ED Results / Procedures / Treatments   Labs (all labs ordered are listed, but only abnormal results are displayed) Labs Reviewed  COMPREHENSIVE METABOLIC PANEL - Abnormal; Notable for the following components:      Result Value   Glucose, Bld 129 (*)    Calcium 8.5 (*)    All other components within normal limits  CBC WITH DIFFERENTIAL/PLATELET  URINALYSIS, ROUTINE W REFLEX  MICROSCOPIC  PREGNANCY, URINE  HCG, QUANTITATIVE, PREGNANCY    EKG None  Radiology No results found.  Procedures Procedures   Medications Ordered in ED Medications  morphine 4 MG/ML injection 4 mg (4 mg Intravenous Given 05/20/21 0534)  ondansetron (ZOFRAN) injection 4 mg (4 mg Intravenous Given 05/20/21 0535)    ED Course  I have reviewed the triage vital signs and the nursing notes.  Pertinent labs & imaging results that were available during my care of the patient were reviewed by me and considered in my medical decision making (see chart for details).    MDM Rules/Calculators/A&P                           Patient presents with abdominal pain.  Recent admission for diverticulitis with abscess.  She is uncomfortable appearing and has tenderness on exam.  She is nontoxic and vital signs are reassuring.  Patient was given pain and nausea medication.  Lab work obtained.  No significant metabolic derangements.  No significant leukocytosis.  However, given pain and recent history, would be concern for diverticular complication.  Other considerations include bowel obstruction.  Will obtain CT scan.  Patient signed out to oncoming provider.  Final Clinical Impression(s) / ED Diagnoses Final diagnoses:  None    Rx / DC Orders ED Discharge Orders     None        Lanesha Azzaro, 07/20/21, MD 05/20/21 [phone]

## 2021-05-20 NOTE — ED Provider Notes (Signed)
Currently on antibiotics for diverticulitis.  Had admission recently after having abscess.  Slightly worse pain today.  Lab work and vitals are unremarkable.  Awaiting CT scan abdomen pelvis.  CT scan overall with great improvement.  Abscess now may be 1 cm as opposed to about 2.7 cm last CT scan.  There are no new findings otherwise.  Given reassurance and discharged in ED in good condition.  We will write her for oxycodone for breakthrough pain.  This chart was dictated using voice recognition software.  Despite best efforts to proofread,  errors can occur which can change the documentation meaning.    Lori Norfolk, DO 05/20/21 [id number]

## 2021-05-20 NOTE — ED Triage Notes (Signed)
  Patient comes in with left lower abdominal pain that started yesterday.  Patient states she was recently admitted for diverticulitis and discharged on 8/1 on antibiotics.  Patient attempted to have a bowel movement earlier with very little results.  Patient describes pain as sharp/cramping, and twisting in LL abdomen.  Pain 10/10.

## 2021-05-22 ENCOUNTER — Encounter: Payer: Self-pay | Admitting: Nurse Practitioner

## 2021-05-22 ENCOUNTER — Ambulatory Visit: Payer: Self-pay | Admitting: *Deleted

## 2021-05-22 ENCOUNTER — Other Ambulatory Visit: Payer: Self-pay | Admitting: Nurse Practitioner

## 2021-05-22 DIAGNOSIS — J45909 Unspecified asthma, uncomplicated: Secondary | ICD-10-CM

## 2021-05-22 MED ORDER — ACETAMINOPHEN 325 MG PO TABS: 650.0000 mg | ORAL_TABLET | Freq: Four times a day (QID) | ORAL | 0 refills | Status: DC | PRN

## 2021-05-22 MED ORDER — ALBUTEROL SULFATE HFA 108 (90 BASE) MCG/ACT IN AERS: 2.0000 | INHALATION_SPRAY | Freq: Four times a day (QID) | RESPIRATORY_TRACT | 2 refills | Status: DC | PRN

## 2021-05-22 MED ORDER — PROMETHAZINE-DM 6.25-15 MG/5ML PO SYRP: 5.0000 mL | ORAL_SOLUTION | Freq: Four times a day (QID) | ORAL | 0 refills | Status: DC | PRN

## 2021-05-22 NOTE — Telephone Encounter (Signed)
Reason for Disposition  [1] HIGH RISK for severe COVID complications (e.g., weak immune system, age > 64 years, obesity with BMI > 25, pregnant, chronic lung disease or other chronic medical condition) AND [2] COVID symptoms (e.g., cough, fever)  (Exceptions: Already seen by PCP and no new or worsening symptoms.)  Answer Assessment - Initial Assessment Questions 1. COVID-19 DIAGNOSIS: "Who made your COVID-19 diagnosis?" "Was it confirmed by a positive lab test or self-test?" If not diagnosed by a doctor (or NP/PA), ask "Are there lots of cases (community spread) where you live?" Note: See public health department website, if unsure.     [facility] tested positive for covid today  2. COVID-19 EXPOSURE: "Was there any known exposure to COVID before the symptoms began?" CDC Definition of close contact: within 6 feet (2 meters) for a total of 15 minutes or more over a 24-hour period.      boyfriend 3. ONSET: "When did the COVID-19 symptoms start?"      Saturday 05/18/21 4. WORST SYMPTOM: "What is your worst symptom?" (e.g., cough, fever, shortness of breath, muscle aches)     Cough, shortness of breath, cold sweats 5. COUGH: "Do you have a cough?" If Yes, ask: "How bad is the cough?"       Yes , not bad dry cough  6. FEVER: "Do you have a fever?" If Yes, ask: "What is your temperature, how was it measured, and when did it start?"     na 7. RESPIRATORY STATUS: "Describe your breathing?" (e.g., shortness of breath, wheezing, unable to speak)      Shortness of breath with exertion 8. BETTER-SAME-WORSE: "Are you getting better, staying the same or getting worse compared to yesterday?"  If getting worse, ask, "In what way?"     Better  9. HIGH RISK DISEASE: "Do you have any chronic medical problems?" (e.g., asthma, heart or lung disease, weak immune system, obesity, etc.)     Asthma , diverticulitis July- August 3.  10. VACCINE: "Have you had the COVID-19 vaccine?" If Yes, ask: "Which one, how many shots,  when did you get it?"       no 11. BOOSTER: "Have you received your COVID-19 booster?" If Yes, ask: "Which one and when did you get it?"       no 12. PREGNANCY: "Is there any chance you are pregnant?" "When was your last menstrual period?"       no 13. OTHER SYMPTOMS: "Do you have any other symptoms?"  (e.g., chills, fatigue, headache, loss of smell or taste, muscle pain, sore throat)       Cold sweats, cough, sore throat, shortness of breath 14. O2 SATURATION MONITOR:  "Do you use an oxygen saturation monitor (pulse oximeter) at home?" If Yes, ask "What is your reading (oxygen level) today?" "What is your usual oxygen saturation reading?" (e.g., 95%)       na  Protocols used: Coronavirus (COVID-19) Diagnosed or Suspected-A-AH

## 2021-05-22 NOTE — Telephone Encounter (Signed)
Patient c/o increasing shortness of breath with exertion today.  Positive covid tested at [facility] and symptoms started Saturday 05/18/21 with cold sweats, fever, cough, sore throat, headache. Patient has not been vaccinated and recently discharged from hospital with diverticulitis. Hx asthma. Patient on day 5 of quarantine. Requesting antiviral medication if possible. No available virtual appt . Please advise . Care advise given. Isolation precautions reviewed with patient for covid. Patient verbalized understanding of care advise and to call back or go to [facility] or ED if symptoms worsen. Gave patient [facility] # for appt if she can be seen today . Please advise .

## 2021-05-22 NOTE — Telephone Encounter (Signed)
Antiviral should be given within 5 days of symptom onset. At this time we will have to monitor and if symptoms progress she will need to go to urgent care or ED. I have sent a cough syrup. She can take tylenol for sore throat and fever. I reached out to her Via mychart as well.

## 2021-05-28 ENCOUNTER — Ambulatory Visit: Payer: Self-pay | Admitting: Nurse Practitioner

## 2021-06-04 ENCOUNTER — Ambulatory Visit: Payer: Self-pay | Admitting: Nurse Practitioner

## 2021-07-08 ENCOUNTER — Ambulatory Visit: Payer: Self-pay | Admitting: Nurse Practitioner

## 2021-07-13 ENCOUNTER — Inpatient Hospital Stay (HOSPITAL_BASED_OUTPATIENT_CLINIC_OR_DEPARTMENT_OTHER)
Admission: EM | Admit: 2021-07-13 | Discharge: 2021-07-17 | DRG: 392 | Disposition: A | Discharge status: Home / Self Care | Payer: Self-pay | Attending: Internal Medicine | Admitting: Internal Medicine

## 2021-07-13 ENCOUNTER — Other Ambulatory Visit: Payer: Self-pay

## 2021-07-13 ENCOUNTER — Emergency Department (HOSPITAL_BASED_OUTPATIENT_CLINIC_OR_DEPARTMENT_OTHER): Payer: Self-pay

## 2021-07-13 ENCOUNTER — Encounter (HOSPITAL_BASED_OUTPATIENT_CLINIC_OR_DEPARTMENT_OTHER): Payer: Self-pay | Admitting: Emergency Medicine

## 2021-07-13 DIAGNOSIS — Z8249 Family history of ischemic heart disease and other diseases of the circulatory system: Secondary | ICD-10-CM

## 2021-07-13 DIAGNOSIS — K5720 Diverticulitis of large intestine with perforation and abscess without bleeding: Principal | ICD-10-CM | POA: Diagnosis present

## 2021-07-13 DIAGNOSIS — R1032 Left lower quadrant pain: Secondary | ICD-10-CM | POA: Diagnosis present

## 2021-07-13 DIAGNOSIS — I10 Essential (primary) hypertension: Secondary | ICD-10-CM | POA: Diagnosis present

## 2021-07-13 DIAGNOSIS — E039 Hypothyroidism, unspecified: Secondary | ICD-10-CM | POA: Diagnosis present

## 2021-07-13 DIAGNOSIS — R300 Dysuria: Secondary | ICD-10-CM | POA: Diagnosis present

## 2021-07-13 DIAGNOSIS — Z79899 Other long term (current) drug therapy: Secondary | ICD-10-CM

## 2021-07-13 DIAGNOSIS — K5780 Diverticulitis of intestine, part unspecified, with perforation and abscess without bleeding: Secondary | ICD-10-CM

## 2021-07-13 DIAGNOSIS — Z20822 Contact with and (suspected) exposure to covid-19: Secondary | ICD-10-CM | POA: Diagnosis present

## 2021-07-13 DIAGNOSIS — Z79890 Hormone replacement therapy (postmenopausal): Secondary | ICD-10-CM

## 2021-07-13 DIAGNOSIS — F17210 Nicotine dependence, cigarettes, uncomplicated: Secondary | ICD-10-CM | POA: Diagnosis present

## 2021-07-13 DIAGNOSIS — Z800 Family history of malignant neoplasm of digestive organs: Secondary | ICD-10-CM

## 2021-07-13 DIAGNOSIS — J45909 Unspecified asthma, uncomplicated: Secondary | ICD-10-CM | POA: Diagnosis present

## 2021-07-13 DIAGNOSIS — Z885 Allergy status to narcotic agent status: Secondary | ICD-10-CM

## 2021-07-13 DIAGNOSIS — K5792 Diverticulitis of intestine, part unspecified, without perforation or abscess without bleeding: Secondary | ICD-10-CM

## 2021-07-13 HISTORY — DX: Diverticulitis of intestine, part unspecified, with perforation and abscess without bleeding: K57.80

## 2021-07-13 LAB — URINALYSIS, ROUTINE W REFLEX MICROSCOPIC
Bilirubin Urine: NEGATIVE
Glucose, UA: NEGATIVE mg/dL
Hgb urine dipstick: NEGATIVE
Ketones, ur: NEGATIVE mg/dL
Leukocytes,Ua: NEGATIVE
Nitrite: NEGATIVE
Protein, ur: 30 mg/dL — AB
Specific Gravity, Urine: 1.027 (ref 1.005–1.030)
pH: 6 (ref 5.0–8.0)

## 2021-07-13 LAB — CBC WITH DIFFERENTIAL/PLATELET
Abs Immature Granulocytes: 0.02 10*3/uL (ref 0.00–0.07)
Basophils Absolute: 0 10*3/uL (ref 0.0–0.1)
Basophils Relative: 1 %
Eosinophils Absolute: 0.1 10*3/uL (ref 0.0–0.5)
Eosinophils Relative: 1 %
HCT: 45 % (ref 36.0–46.0)
Hemoglobin: 15 g/dL (ref 12.0–15.0)
Immature Granulocytes: 0 %
Lymphocytes Relative: 32 %
Lymphs Abs: 2.2 10*3/uL (ref 0.7–4.0)
MCH: 31.6 pg (ref 26.0–34.0)
MCHC: 33.3 g/dL (ref 30.0–36.0)
MCV: 94.9 fL (ref 80.0–100.0)
Monocytes Absolute: 0.7 10*3/uL (ref 0.1–1.0)
Monocytes Relative: 10 %
Neutro Abs: 3.9 10*3/uL (ref 1.7–7.7)
Neutrophils Relative %: 56 %
Platelets: 167 10*3/uL (ref 150–400)
RBC: 4.74 MIL/uL (ref 3.87–5.11)
RDW: 12.4 % (ref 11.5–15.5)
WBC: 7 10*3/uL (ref 4.0–10.5)
nRBC: 0 % (ref 0.0–0.2)

## 2021-07-13 LAB — COMPREHENSIVE METABOLIC PANEL
ALT: 24 U/L (ref 0–44)
AST: 25 U/L (ref 15–41)
Albumin: 4 g/dL (ref 3.5–5.0)
Alkaline Phosphatase: 67 U/L (ref 38–126)
Anion gap: 6 (ref 5–15)
BUN: 9 mg/dL (ref 6–20)
CO2: 26 mmol/L (ref 22–32)
Calcium: 8.2 mg/dL — ABNORMAL LOW (ref 8.9–10.3)
Chloride: 106 mmol/L (ref 98–111)
Creatinine, Ser: 0.7 mg/dL (ref 0.44–1.00)
GFR, Estimated: >60 mL/min (ref >60)
Glucose, Bld: 107 mg/dL — ABNORMAL HIGH (ref 70–99)
Potassium: 3.4 mmol/L — ABNORMAL LOW (ref 3.5–5.1)
Sodium: 138 mmol/L (ref 135–145)
Total Bilirubin: 0.5 mg/dL (ref 0.3–1.2)
Total Protein: 7.8 g/dL (ref 6.5–8.1)

## 2021-07-13 LAB — RESP PANEL BY RT-PCR (FLU A&B, COVID) ARPGX2
Influenza A by PCR: NEGATIVE
Influenza B by PCR: NEGATIVE
SARS Coronavirus 2 by RT PCR: NEGATIVE

## 2021-07-13 LAB — PREGNANCY, URINE: Preg Test, Ur: NEGATIVE

## 2021-07-13 LAB — LIPASE, BLOOD: Lipase: 27 U/L (ref 11–51)

## 2021-07-13 MED ORDER — PIPERACILLIN-TAZOBACTAM 3.375 G IVPB
3.3750 g | Freq: Three times a day (TID) | INTRAVENOUS | Status: DC
  Administered 2021-07-13 – 2021-07-16 (×8): 3.375 g via INTRAVENOUS
  Filled 2021-07-13 (×9): qty 50

## 2021-07-13 MED ORDER — MORPHINE SULFATE (PF) 4 MG/ML IV SOLN
4.0000 mg | Freq: Once | INTRAVENOUS | Status: AC
  Administered 2021-07-13: 4 mg via INTRAVENOUS
  Filled 2021-07-13: qty 1

## 2021-07-13 MED ORDER — IOHEXOL 350 MG/ML SOLN
100.0000 mL | Freq: Once | INTRAVENOUS | Status: AC | PRN
  Administered 2021-07-13: 100 mL via INTRAVENOUS

## 2021-07-13 MED ORDER — SODIUM CHLORIDE 0.9 % IV BOLUS
1000.0000 mL | Freq: Once | INTRAVENOUS | Status: AC
  Administered 2021-07-13: 1000 mL via INTRAVENOUS

## 2021-07-13 MED ORDER — ONDANSETRON HCL 4 MG/2ML IJ SOLN
4.0000 mg | Freq: Once | INTRAMUSCULAR | Status: AC
  Administered 2021-07-13: 4 mg via INTRAVENOUS
  Filled 2021-07-13: qty 2

## 2021-07-13 NOTE — Progress Notes (Signed)
Pharmacy Antibiotic Note  Lori Mcknight is a 35 y.o. female admitted on 07/13/2021 presenting with pelvic pain, fever and chills, hx diverticulitis.  Pharmacy has been consulted for zosyn dosing.  Plan: Zosyn 3.375g IV q8h (extended infusion) Monitor renal function, LOT      Temp (24hrs), Avg:98.6 F (37 C), Min:98.6 F (37 C), Max:98.6 F (37 C)  Recent Labs  Lab 07/13/21 1824 07/13/21 1945  WBC 7.0  --   CREATININE  --  0.70    CrCl cannot be calculated (Unknown ideal weight.).    Allergies  Allergen Reactions   Dilaudid [Hydromorphone] Rash    Itching all over body    [name], PharmD Clinical Pharmacist ED Pharmacist Phone # [phone] 07/13/2021 9:35 PM

## 2021-07-13 NOTE — ED Provider Notes (Signed)
[facility] EMERGENCY DEPT Provider Note   CSN: [id number] Arrival date & time: 07/13/21  1635     History Chief Complaint  Patient presents with   Pelvic Pain    Lori Mcknight is a 35 y.o. female.  35 year old female presents with complaint of recurrent left lower quadrant abdominal pain.  Patient states that she has a history of diverticulitis, has had prior abscess, last admitted for same in August, symptoms feel similar somewhat worse.  States that she had a temperature of 100 yesterday with chills and urinary discomfort.  Denies nausea, vomiting.  Pain is sharp and aching in nature, does not radiate.  No other complaints or concerns. Plan was for colonoscopy 6 weeks after her last admission however due to her orange card status she was unable to find a provider accepting orange cards for follow up.       Past Medical History:  Diagnosis Date   Anemia    history of anemia   Anxiety    Asthma    inhaler as needed   Chest pain of uncertain etiology    intermittent left side chest pain   Chlamydia    Depression    Diverticulitis 2019   Endometriosis    Genital herpes    Headache    MIGRAINES   Hypertension    no meds since April   Hypothyroidism    Obese    Obesity, Class III, BMI 40-49.9 (morbid obesity) (HCC) 11/06/2011   PCOS (polycystic ovarian syndrome)    PONV (postoperative nausea and vomiting)    Shortness of breath    on exertion from weight    Patient Active Problem List   Diagnosis Date Noted   Generalized abdominal pain 05/09/2021   Diverticulitis of large intestine with abscess 05/09/2021   Hypokalemia 05/09/2021   Asthma, mild intermittent 05/09/2021   Diverticulitis 03/26/2020   Obesity, morbid, BMI 50 or higher (HCC) 08/08/2019   Endometriosis s/p ablation 08/08/2019   Dysuria 08/08/2019   Family history of colon cancer in father 08/08/2019   Acute diverticulitis 08/07/2019   Incomplete bladder emptying 02/18/2018   DUB  (dysfunctional uterine bleeding) 02/18/2018   Anemia 02/18/2018   Hypothyroid 01/29/2017   Migraines 08/26/2016   Chest pain 02/04/2016   Abnormal uterine bleeding (AUB) 01/09/2014   Thickened endometrium 12/02/2013   Essential hypertension 10/21/2013   PCOS (polycystic ovarian syndrome) 11/06/2011   Infertility, female 11/06/2011   Pelvic pain in female 08/01/2011    Past Surgical History:  Procedure Laterality Date   DILATION AND CURETTAGE OF UTERUS N/A 04/29/2018   Procedure: DILATATION AND CURETTAGE;  Surgeon: Allie Bossier, MD;  Location: [facility];  Service: Gynecology;  Laterality: N/A;   HYSTEROSCOPY WITH D & C N/A 04/06/2014   Procedure: DILATATION AND CURETTAGE /HYSTEROSCOPY ;  Surgeon: Tereso Newcomer, MD;  Location: [facility];  Service: Gynecology;  Laterality: N/A;   LAPAROSCOPY N/A 04/29/2018   Procedure: LAPAROSCOPY DIAGNOSTIC WITH PERITONEAL BIOPSY;  Surgeon: Allie Bossier, MD;  Location: [facility];  Service: Gynecology;  Laterality: N/A;   LEFT HEART CATH AND CORONARY ANGIOGRAPHY N/A 12/12/2016   Procedure: Left Heart Cath and Coronary Angiography;  Surgeon: Peter M Swaziland, MD;  Location: [facility] INVASIVE CV LAB;  Service: Cardiovascular;  Laterality: N/A;   WISDOM TOOTH EXTRACTION       OB History     Gravida  0   Para  0   Term  0   Preterm  0  AB  0   Living  0      SAB  0   IAB  0   Ectopic  0   Multiple  0   Live Births  0           Family History  Problem Relation Age of Onset   Heart disease Mother    Sleep apnea Mother    Depression Mother    Hypertension Mother    Kidney disease Mother    Cancer Father    Diabetes Father    Heart disease Father    Colon cancer Father        67s   Hypertension Father    Diabetes Sister    Hypertension Sister    Heart disease Maternal Grandmother    Diabetes Maternal Grandfather    Heart disease Maternal Grandfather    Colon cancer Cousin        died age 76, paternal cousin    Social History    Tobacco Use   Smoking status: Some Days    Packs/day: 0.25    Types: Cigarettes   Smokeless tobacco: Never   Tobacco comments:    2-3 cigerettes a week.  Vaping Use   Vaping Use: Never used  Substance Use Topics   Alcohol use: Not Currently    Alcohol/week: 0.0 standard drinks    Comment: occasonal    Drug use: Not Currently    Types: Marijuana    Comment: last use 3 months ago    Home Medications Prior to Admission medications   Medication Sig Start Date End Date Taking? Authorizing Provider  acetaminophen (TYLENOL) 325 MG tablet Take 2 tablets (650 mg total) by mouth every 6 (six) hours as needed for mild pain or fever. 05/22/21   Claiborne Rigg, NP  albuterol (VENTOLIN HFA) 108 (90 Base) MCG/ACT inhaler Inhale 2 puffs into the lungs every 6 (six) hours as needed for wheezing or shortness of breath. 05/22/21   Claiborne Rigg, NP  cyclobenzaprine (FLEXERIL) 10 MG tablet Take 1 tablet (10 mg total) by mouth at bedtime. 05/06/21   Claiborne Rigg, NP  gabapentin (NEURONTIN) 300 MG capsule Take 1 capsule (300 mg total) by mouth 2 (two) times daily. 08/28/20   Rema Fendt, NP  ibuprofen (ADVIL) 200 MG tablet Take 800 mg by mouth every 6 (six) hours as needed for moderate pain.    [provider]  levothyroxine (SYNTHROID) 88 MCG tablet Take 1 tablet (88 mcg total) by mouth daily before breakfast. 05/15/21 06/14/21  Lanae Boast, MD  medroxyPROGESTERone (PROVERA) 10 MG tablet Take 1 tablet (10 mg total) by mouth daily. Patient not taking: No sig reported 11/19/20   Jerene Bears, MD  oxyCODONE (ROXICODONE) 5 MG immediate release tablet Take 1 tablet (5 mg total) by mouth every 6 (six) hours as needed for up to 10 doses for breakthrough pain. 05/20/21   Curatolo, Adam, DO  promethazine-dextromethorphan (PROMETHAZINE-DM) 6.25-15 MG/5ML syrup Take 5 mLs by mouth 4 (four) times daily as needed for cough. 05/22/21   Claiborne Rigg, NP    Allergies    Dilaudid  [hydromorphone]  Review of Systems   Review of Systems  Constitutional:  Positive for chills and fever.  Respiratory:  Negative for shortness of breath.   Cardiovascular:  Negative for chest pain.  Gastrointestinal:  Positive for abdominal pain, constipation and diarrhea. Negative for blood in stool, nausea and vomiting.  Genitourinary:  Positive for dysuria.  Musculoskeletal:  Negative for arthralgias and myalgias.  Skin:  Negative for rash and wound.  Allergic/Immunologic: Negative for immunocompromised state.  Neurological:  Negative for weakness.  Hematological:  Does not bruise/bleed easily.  Psychiatric/Behavioral:  Negative for confusion.   All other systems reviewed and are negative.  Physical Exam Updated Vital Signs BP 119/86   Pulse 81   Temp 98.6 F (37 C) (Oral)   Resp 18   SpO2 100%   Physical Exam Vitals and nursing note reviewed.  Constitutional:      General: She is not in acute distress.    Appearance: She is well-developed. She is obese. She is not diaphoretic.  HENT:     Head: Normocephalic and atraumatic.  Cardiovascular:     Rate and Rhythm: Normal rate and regular rhythm.     Pulses: Normal pulses.     Heart sounds: Normal heart sounds.  Pulmonary:     Effort: Pulmonary effort is normal.     Breath sounds: Normal breath sounds.  Abdominal:     Palpations: Abdomen is soft.     Tenderness: There is abdominal tenderness in the suprapubic area, left upper quadrant and left lower quadrant.  Musculoskeletal:     Cervical back: Neck supple.  Skin:    General: Skin is warm and dry.     Findings: No erythema or rash.  Neurological:     Mental Status: She is alert and oriented to person, place, and time.  Psychiatric:        Behavior: Behavior normal.    ED Results / Procedures / Treatments   Labs (all labs ordered are listed, but only abnormal results are displayed) Labs Reviewed  URINALYSIS, ROUTINE W REFLEX MICROSCOPIC - Abnormal; Notable for  the following components:      Result Value   APPearance HAZY (*)    Protein, ur 30 (*)    Bacteria, UA RARE (*)    All other components within normal limits  COMPREHENSIVE METABOLIC PANEL - Abnormal; Notable for the following components:   Potassium 3.4 (*)    Glucose, Bld 107 (*)    Calcium 8.2 (*)    All other components within normal limits  RESP PANEL BY RT-PCR (FLU A&B, COVID) ARPGX2  CBC WITH DIFFERENTIAL/PLATELET  PREGNANCY, URINE  LIPASE, BLOOD    EKG None  Radiology CT Abdomen Pelvis W Contrast  Result Date: 07/13/2021 CLINICAL DATA:  Pelvic pain and urinary incontinence EXAM: CT ABDOMEN AND PELVIS WITH CONTRAST TECHNIQUE: Multidetector CT imaging of the abdomen and pelvis was performed using the standard protocol following bolus administration of intravenous contrast. CONTRAST:  OMNIPAQUE IOHEXOL 350 MG/ML SOLN COMPARISON:  05/20/2021 FINDINGS: Lower chest: No acute abnormality. Hepatobiliary: No focal liver abnormality is seen. No gallstones, gallbladder wall thickening, or biliary dilatation. Pancreas: Unremarkable. No pancreatic ductal dilatation or surrounding inflammatory changes. Spleen: Normal in size without focal abnormality. Adrenals/Urinary Tract: Adrenal glands are stable in appearance. Kidneys demonstrate a normal enhancement pattern bilaterally. No obstructive changes are seen. Ureters are within normal limits. The bladder is partially distended. Stomach/Bowel: There are changes again seen consistent with diverticulitis in the sigmoid colon with smooth muscle hypertrophy and pericolonic fluid collection which now measures 2.3 cm in greatest dimension. It indents upon the superior aspect of the urinary bladder although no fistula is identified. These changes have worsened in the interval from the prior exam at which time the small abscess measured 1 cm. More proximal colon appears within normal limits. The appendix is unremarkable. Small  bowel and stomach are  unremarkable. Vascular/Lymphatic: No significant vascular findings are present. No enlarged abdominal or pelvic lymph nodes. Reproductive: Uterus and bilateral adnexa are unremarkable. Other: No abdominal wall hernia or abnormality. No abdominopelvic ascites. Musculoskeletal: No acute or significant osseous findings. IMPRESSION: Changes of diverticulitis with a focal abscess adjacent to the colon which is increased in size now measuring 2.3 cm. It indents upon the superior aspect of the urinary bladder and focal bladder wall thickening is noted although no fistula is seen. Electronically Signed   By: Alcide Clever M.D.   On: 07/13/2021 21:15    Procedures Procedures   Medications Ordered in ED Medications  piperacillin-tazobactam (ZOSYN) IVPB 3.375 g (3.375 g Intravenous New Bag/Given 07/13/21 2142)  ondansetron (ZOFRAN) injection 4 mg (4 mg Intravenous Given 07/13/21 1852)  sodium chloride 0.9 % bolus 1,000 mL (0 mLs Intravenous Stopped 07/13/21 2006)  morphine 4 MG/ML injection 4 mg (4 mg Intravenous Given 07/13/21 1851)  iohexol (OMNIPAQUE) 350 MG/ML injection 100 mL (100 mLs Intravenous Contrast Given 07/13/21 2047)    ED Course  I have reviewed the triage vital signs and the nursing notes.  Pertinent labs & imaging results that were available during my care of the patient were reviewed by me and considered in my medical decision making (see chart for details).  Clinical Course as of 07/13/21 2155  Sat Jul 13, 2021  352 35 year old female with complaint of urinary discomfort and left lower quadrant pain with fever (max temp 100) and chills.  This is recurrent problem for this patient, states that she has had diverticulitis in the past that feels similar however this pain is worse today. [LM]  2135 Review of patient's most recent discharge from the hospital in May 15, 2021, plan was for patient to complete oral antibiotics and to follow-up for colonoscopy and colectomy.  Unfortunately, patient  has not been unable to obtain follow-up due to lack of insurance (has an orange card).  Labs today are overall reassuring, CBC with normal white blood cell count, CMP without significant with hydration.  COVID and flu negative.  Lipase normal is. Vitals reassuring, afebrile with temp of 98.6 with O2 sat 98% on room air.  CT scan shows diverticulitis with abscess measuring 2.3 cm, compared to prior CT obtained in August at which time this abscess was 1 cm.  Case discussed with Dr. Cliffton Asters with general surgery, request Zosyn, hospitalist to admit and surgery will consult. [LM]    Clinical Course User Index [LM] Alden Hipp   MDM Rules/Calculators/A&P                           Final Clinical Impression(s) / ED Diagnoses Final diagnoses:  Left lower quadrant abdominal pain  Diverticulitis    Rx / DC Orders ED Discharge Orders     None        Alden Hipp 07/13/21 2156    Terald Sleeper, MD 07/13/21 2300

## 2021-07-13 NOTE — ED Notes (Signed)
Called [facility] and advised that patient has a room ready, [facility], [facility]/Med Surg 1301

## 2021-07-13 NOTE — Consult Note (Signed)
CC: abdominal pain  Requesting provider: laura murray pa-c  HPI: Lori Mcknight is an 35 y.o. female who is here for history of diverticulitis with abscess - recently discharged 05/2021 with 1 cm perisigmoidal abscess returned to ed with same sxs today. Reports a 1-2d hx of similar sxs llq pain, temp of 100F yesterday. Pain is sharp and aching in nature, does not radiate.  No other complaints or concerns.  She has been following with one of my partners, Dr. Michaell Cowing, who was planning potentially for surgery but she is still pending a colonoscopy.  Past Medical History:  Diagnosis Date   Anemia    history of anemia   Anxiety    Asthma    inhaler as needed   Chest pain of uncertain etiology    intermittent left side chest pain   Chlamydia    Depression    Diverticulitis 2019   Endometriosis    Genital herpes    Headache    MIGRAINES   Hypertension    no meds since April   Hypothyroidism    Obese    Obesity, Class III, BMI 40-49.9 (morbid obesity) (HCC) 11/06/2011   PCOS (polycystic ovarian syndrome)    PONV (postoperative nausea and vomiting)    Shortness of breath    on exertion from weight    Past Surgical History:  Procedure Laterality Date   DILATION AND CURETTAGE OF UTERUS N/A 04/29/2018   Procedure: DILATATION AND CURETTAGE;  Surgeon: Allie Bossier, MD;  Location: [facility];  Service: Gynecology;  Laterality: N/A;   HYSTEROSCOPY WITH D & C N/A 04/06/2014   Procedure: DILATATION AND CURETTAGE /HYSTEROSCOPY ;  Surgeon: Tereso Newcomer, MD;  Location: [facility];  Service: Gynecology;  Laterality: N/A;   LAPAROSCOPY N/A 04/29/2018   Procedure: LAPAROSCOPY DIAGNOSTIC WITH PERITONEAL BIOPSY;  Surgeon: Allie Bossier, MD;  Location: [facility];  Service: Gynecology;  Laterality: N/A;   LEFT HEART CATH AND CORONARY ANGIOGRAPHY N/A 12/12/2016   Procedure: Left Heart Cath and Coronary Angiography;  Surgeon: Peter M Swaziland, MD;  Location: [facility] INVASIVE CV LAB;  Service: Cardiovascular;  Laterality:  N/A;   WISDOM TOOTH EXTRACTION      Family History  Problem Relation Age of Onset   Heart disease Mother    Sleep apnea Mother    Depression Mother    Hypertension Mother    Kidney disease Mother    Cancer Father    Diabetes Father    Heart disease Father    Colon cancer Father        30s   Hypertension Father    Diabetes Sister    Hypertension Sister    Heart disease Maternal Grandmother    Diabetes Maternal Grandfather    Heart disease Maternal Grandfather    Colon cancer Cousin        died age 59, paternal cousin    Social:  reports that she has been smoking cigarettes. She has been smoking an average of .25 packs per day. She has never used smokeless tobacco. She reports that she does not currently use alcohol. She reports that she does not currently use drugs after having used the following drugs: Marijuana.  Allergies:  Allergies  Allergen Reactions   Dilaudid [Hydromorphone] Rash    Itching all over body    Medications: I have reviewed the patient's current medications.  Results for orders placed or performed during the hospital encounter of 07/13/21 (from the past 48 hour(s))  Pregnancy,  urine     Status: None   Collection Time: 07/13/21  5:30 PM  Result Value Ref Range   Preg Test, Ur NEGATIVE NEGATIVE    Comment:        THE SENSITIVITY OF THIS METHODOLOGY IS >20 mIU/mL. Performed at [facility], [address] [place], [place] [address]   Urinalysis, Routine w reflex microscopic Urine, Clean Catch     Status: Abnormal   Collection Time: 07/13/21  5:39 PM  Result Value Ref Range   Color, Urine YELLOW YELLOW   APPearance HAZY (A) CLEAR   Specific Gravity, Urine 1.027 1.005 - 1.030   pH 6.0 5.0 - 8.0   Glucose, UA NEGATIVE NEGATIVE mg/dL   Hgb urine dipstick NEGATIVE NEGATIVE   Bilirubin Urine NEGATIVE NEGATIVE   Ketones, ur NEGATIVE NEGATIVE mg/dL   Protein, ur 30 (A) NEGATIVE mg/dL   Nitrite NEGATIVE NEGATIVE   Leukocytes,Ua  NEGATIVE NEGATIVE   RBC / HPF 0-5 0 - 5 RBC/hpf   WBC, UA 6-10 0 - 5 WBC/hpf   Bacteria, UA RARE (A) NONE SEEN   Squamous Epithelial / LPF 21-50 0 - 5   Mucus PRESENT    Hyaline Casts, UA PRESENT     Comment: Performed at [facility], [address] [place], [place] [address]  CBC with Differential     Status: None   Collection Time: 07/13/21  6:24 PM  Result Value Ref Range   WBC 7.0 4.0 - 10.5 K/uL   RBC 4.74 3.87 - 5.11 MIL/uL   Hemoglobin 15.0 12.0 - 15.0 g/dL   HCT 09.8 11.9 - 14.7 %   MCV 94.9 80.0 - 100.0 fL   MCH 31.6 26.0 - 34.0 pg   MCHC 33.3 30.0 - 36.0 g/dL   RDW 82.9 56.2 - 13.0 %   Platelets 167 150 - 400 K/uL   nRBC 0.0 0.0 - 0.2 %   Neutrophils Relative % 56 %   Neutro Abs 3.9 1.7 - 7.7 K/uL   Lymphocytes Relative 32 %   Lymphs Abs 2.2 0.7 - 4.0 K/uL   Monocytes Relative 10 %   Monocytes Absolute 0.7 0.1 - 1.0 K/uL   Eosinophils Relative 1 %   Eosinophils Absolute 0.1 0.0 - 0.5 K/uL   Basophils Relative 1 %   Basophils Absolute 0.0 0.0 - 0.1 K/uL   Immature Granulocytes 0 %   Abs Immature Granulocytes 0.02 0.00 - 0.07 K/uL    Comment: Performed at [facility], [address] [place], [place] [address]  Resp Panel by RT-PCR (Flu A&B, Covid) Nasopharyngeal Swab     Status: None   Collection Time: 07/13/21  6:24 PM   Specimen: Nasopharyngeal Swab; Nasopharyngeal(NP) swabs in vial transport medium  Result Value Ref Range   SARS Coronavirus 2 by RT PCR NEGATIVE NEGATIVE    Comment: (NOTE) SARS-CoV-2 target nucleic acids are NOT DETECTED.  The SARS-CoV-2 RNA is generally detectable in upper respiratory specimens during the acute phase of infection. The lowest concentration of SARS-CoV-2 viral copies this assay can detect is 138 copies/mL. A negative result does not preclude SARS-Cov-2 infection and should not be used as the sole basis for treatment or other patient management decisions. A negative result may occur  with  improper specimen collection/handling, submission of specimen other than nasopharyngeal swab, presence of viral mutation(s) within the areas targeted by this assay, and inadequate number of viral copies(<138 copies/mL). A negative result must be combined with clinical observations, patient  history, and epidemiological information. The expected result is Negative.  Fact Sheet for Patients:  BloggerCourse.com  Fact Sheet for Healthcare Providers:  SeriousBroker.it  This test is no t yet approved or cleared by the [place] FDA and  has been authorized for detection and/or diagnosis of SARS-CoV-2 by FDA under an Emergency Use Authorization (EUA). This EUA will remain  in effect (meaning this test can be used) for the duration of the COVID-19 declaration under Section 564(b)(1) of the Act, 21 U.S.C.section 360bbb-3(b)(1), unless the authorization is terminated  or revoked sooner.       Influenza A by PCR NEGATIVE NEGATIVE   Influenza B by PCR NEGATIVE NEGATIVE    Comment: (NOTE) The Xpert Xpress SARS-CoV-2/FLU/RSV plus assay is intended as an aid in the diagnosis of influenza from Nasopharyngeal swab specimens and should not be used as a sole basis for treatment. Nasal washings and aspirates are unacceptable for Xpert Xpress SARS-CoV-2/FLU/RSV testing.  Fact Sheet for Patients: BloggerCourse.com  Fact Sheet for Healthcare Providers: SeriousBroker.it  This test is not yet approved or cleared by the [place] FDA and has been authorized for detection and/or diagnosis of SARS-CoV-2 by FDA under an Emergency Use Authorization (EUA). This EUA will remain in effect (meaning this test can be used) for the duration of the COVID-19 declaration under Section 564(b)(1) of the Act, 21 U.S.C. section 360bbb-3(b)(1), unless the authorization is terminated or revoked.  Performed  at [facility], [address] [place], [place] [address]   Comprehensive metabolic panel     Status: Abnormal   Collection Time: 07/13/21  7:45 PM  Result Value Ref Range   Sodium 138 135 - 145 mmol/L   Potassium 3.4 (L) 3.5 - 5.1 mmol/L   Chloride 106 98 - 111 mmol/L   CO2 26 22 - 32 mmol/L   Glucose, Bld 107 (H) 70 - 99 mg/dL    Comment: Glucose reference range applies only to samples taken after fasting for at least 8 hours.   BUN 9 6 - 20 mg/dL   Creatinine, Ser 4.09 0.44 - 1.00 mg/dL   Calcium 8.2 (L) 8.9 - 10.3 mg/dL   Total Protein 7.8 6.5 - 8.1 g/dL   Albumin 4.0 3.5 - 5.0 g/dL   AST 25 15 - 41 U/L   ALT 24 0 - 44 U/L   Alkaline Phosphatase 67 38 - 126 U/L   Total Bilirubin 0.5 0.3 - 1.2 mg/dL   GFR, Estimated >81 >19 mL/min    Comment: (NOTE) Calculated using the CKD-EPI Creatinine Equation (2021)    Anion gap 6 5 - 15    Comment: Performed at [facility], [address], [place], [place] [address]  Lipase, blood     Status: None   Collection Time: 07/13/21  7:45 PM  Result Value Ref Range   Lipase 27 11 - 51 U/L    Comment: Performed at [facility], [address], [place], [place] [address]    CT Abdomen Pelvis W Contrast  Result Date: 07/13/2021 CLINICAL DATA:  Pelvic pain and urinary incontinence EXAM: CT ABDOMEN AND PELVIS WITH CONTRAST TECHNIQUE: Multidetector CT imaging of the abdomen and pelvis was performed using the standard protocol following bolus administration of intravenous contrast. CONTRAST:  OMNIPAQUE IOHEXOL 350 MG/ML SOLN COMPARISON:  05/20/2021 FINDINGS: Lower chest: No acute abnormality. Hepatobiliary: No focal liver abnormality is seen. No gallstones, gallbladder wall thickening, or biliary dilatation. Pancreas: Unremarkable. No pancreatic ductal dilatation or surrounding inflammatory  changes. Spleen: Normal in size without focal abnormality. Adrenals/Urinary Tract: Adrenal  glands are stable in appearance. Kidneys demonstrate a normal enhancement pattern bilaterally. No obstructive changes are seen. Ureters are within normal limits. The bladder is partially distended. Stomach/Bowel: There are changes again seen consistent with diverticulitis in the sigmoid colon with smooth muscle hypertrophy and pericolonic fluid collection which now measures 2.3 cm in greatest dimension. It indents upon the superior aspect of the urinary bladder although no fistula is identified. These changes have worsened in the interval from the prior exam at which time the small abscess measured 1 cm. More proximal colon appears within normal limits. The appendix is unremarkable. Small bowel and stomach are unremarkable. Vascular/Lymphatic: No significant vascular findings are present. No enlarged abdominal or pelvic lymph nodes. Reproductive: Uterus and bilateral adnexa are unremarkable. Other: No abdominal wall hernia or abnormality. No abdominopelvic ascites. Musculoskeletal: No acute or significant osseous findings. IMPRESSION: Changes of diverticulitis with a focal abscess adjacent to the colon which is increased in size now measuring 2.3 cm. It indents upon the superior aspect of the urinary bladder and focal bladder wall thickening is noted although no fistula is seen. Electronically Signed   By: Alcide Clever M.D.   On: 07/13/2021 21:15    ROS - all of the below systems have been reviewed with the patient and positives are indicated with bold text General: chills, fever or night sweats Eyes: blurry vision or double vision ENT: epistaxis or sore throat Allergy/Immunology: itchy/watery eyes or nasal congestion Hematologic/Lymphatic: bleeding problems, blood clots or swollen lymph nodes Endocrine: temperature intolerance or unexpected weight changes Breast: new or changing breast lumps or nipple discharge Resp: cough, shortness of breath, or wheezing CV: chest pain or dyspnea on exertion GI: as  per HPI GU: dysuria, trouble voiding, or hematuria MSK: joint pain or joint stiffness Neuro: TIA or stroke symptoms Derm: pruritus and skin lesion changes Psych: anxiety and depression  PE Blood pressure 119/86, pulse 81, temperature 98.6 F (37 C), temperature source Oral, resp. rate 18, SpO2 100 %. Constitutional: NAD; conversant; no deformities Eyes: Moist conjunctiva;anicteric; PERRL Neck: Trachea midlin Lungs: Normal respiratory effor, clear bilaterall CV: RRR;  no pitting edema GI: Abd soft, mild tender llq, obese; no palpable hepatosplenomegaly MSK: Normal range of motion of extremities; no clubbing/cyanosis Psychiatric: Appropriate affect; alert and oriented x3 Lymphatic: No palpable cervical or axillary lymphadenopathy  Results for orders placed or performed during the hospital encounter of 07/13/21 (from the past 48 hour(s))  Pregnancy, urine     Status: None   Collection Time: 07/13/21  5:30 PM  Result Value Ref Range   Preg Test, Ur NEGATIVE NEGATIVE    Comment:        THE SENSITIVITY OF THIS METHODOLOGY IS >20 mIU/mL. Performed at [facility], [address] [place], [place] [address]   Urinalysis, Routine w reflex microscopic Urine, Clean Catch     Status: Abnormal   Collection Time: 07/13/21  5:39 PM  Result Value Ref Range   Color, Urine YELLOW YELLOW   APPearance HAZY (A) CLEAR   Specific Gravity, Urine 1.027 1.005 - 1.030   pH 6.0 5.0 - 8.0   Glucose, UA NEGATIVE NEGATIVE mg/dL   Hgb urine dipstick NEGATIVE NEGATIVE   Bilirubin Urine NEGATIVE NEGATIVE   Ketones, ur NEGATIVE NEGATIVE mg/dL   Protein, ur 30 (A) NEGATIVE mg/dL   Nitrite NEGATIVE NEGATIVE   Leukocytes,Ua NEGATIVE NEGATIVE   RBC / HPF 0-5 0 - 5 RBC/hpf  WBC, UA 6-10 0 - 5 WBC/hpf   Bacteria, UA RARE (A) NONE SEEN   Squamous Epithelial / LPF 21-50 0 - 5   Mucus PRESENT    Hyaline Casts, UA PRESENT     Comment: Performed at [facility], [address] [place], [place] [address]  CBC with Differential     Status: None   Collection Time: 07/13/21  6:24 PM  Result Value Ref Range   WBC 7.0 4.0 - 10.5 K/uL   RBC 4.74 3.87 - 5.11 MIL/uL   Hemoglobin 15.0 12.0 - 15.0 g/dL   HCT 60.4 54.0 - 98.1 %   MCV 94.9 80.0 - 100.0 fL   MCH 31.6 26.0 - 34.0 pg   MCHC 33.3 30.0 - 36.0 g/dL   RDW 19.1 47.8 - 29.5 %   Platelets 167 150 - 400 K/uL   nRBC 0.0 0.0 - 0.2 %   Neutrophils Relative % 56 %   Neutro Abs 3.9 1.7 - 7.7 K/uL   Lymphocytes Relative 32 %   Lymphs Abs 2.2 0.7 - 4.0 K/uL   Monocytes Relative 10 %   Monocytes Absolute 0.7 0.1 - 1.0 K/uL   Eosinophils Relative 1 %   Eosinophils Absolute 0.1 0.0 - 0.5 K/uL   Basophils Relative 1 %   Basophils Absolute 0.0 0.0 - 0.1 K/uL   Immature Granulocytes 0 %   Abs Immature Granulocytes 0.02 0.00 - 0.07 K/uL    Comment: Performed at [facility], [address], [place], [place] [address]  Resp Panel by RT-PCR (Flu A&B, Covid) Nasopharyngeal Swab     Status: None   Collection Time: 07/13/21  6:24 PM   Specimen: Nasopharyngeal Swab; Nasopharyngeal(NP) swabs in vial transport medium  Result Value Ref Range   SARS Coronavirus 2 by RT PCR NEGATIVE NEGATIVE    Comment: (NOTE) SARS-CoV-2 target nucleic acids are NOT DETECTED.  The SARS-CoV-2 RNA is generally detectable in upper respiratory specimens during the acute phase of infection. The lowest concentration of SARS-CoV-2 viral copies this assay can detect is 138 copies/mL. A negative result does not preclude SARS-Cov-2 infection and should not be used as the sole basis for treatment or other patient management decisions. A negative result may occur with  improper specimen collection/handling, submission of specimen other than nasopharyngeal swab, presence of viral mutation(s) within the areas targeted by this assay, and inadequate number of viral copies(<138 copies/mL). A negative result must be combined  with clinical observations, patient history, and epidemiological information. The expected result is Negative.  Fact Sheet for Patients:  BloggerCourse.com  Fact Sheet for Healthcare Providers:  SeriousBroker.it  This test is no t yet approved or cleared by the [place] FDA and  has been authorized for detection and/or diagnosis of SARS-CoV-2 by FDA under an Emergency Use Authorization (EUA). This EUA will remain  in effect (meaning this test can be used) for the duration of the COVID-19 declaration under Section 564(b)(1) of the Act, 21 U.S.C.section 360bbb-3(b)(1), unless the authorization is terminated  or revoked sooner.       Influenza A by PCR NEGATIVE NEGATIVE   Influenza B by PCR NEGATIVE NEGATIVE    Comment: (NOTE) The Xpert Xpress SARS-CoV-2/FLU/RSV plus assay is intended as an aid in the diagnosis of influenza from Nasopharyngeal swab specimens and should not be used as a sole basis for treatment. Nasal washings and aspirates are unacceptable for Xpert Xpress SARS-CoV-2/FLU/RSV testing.  Fact Sheet for Patients: BloggerCourse.com  Fact Sheet  for Healthcare Providers: SeriousBroker.it  This test is not yet approved or cleared by the [place] and has been authorized for detection and/or diagnosis of SARS-CoV-2 by FDA under an Emergency Use Authorization (EUA). This EUA will remain in effect (meaning this test can be used) for the duration of the COVID-19 declaration under Section 564(b)(1) of the Act, 21 U.S.C. section 360bbb-3(b)(1), unless the authorization is terminated or revoked.  Performed at [facility], [address] [place], [place] [address]   Comprehensive metabolic panel     Status: Abnormal   Collection Time: 07/13/21  7:45 PM  Result Value Ref Range   Sodium 138 135 - 145 mmol/L   Potassium 3.4 (L) 3.5 - 5.1  mmol/L   Chloride 106 98 - 111 mmol/L   CO2 26 22 - 32 mmol/L   Glucose, Bld 107 (H) 70 - 99 mg/dL    Comment: Glucose reference range applies only to samples taken after fasting for at least 8 hours.   BUN 9 6 - 20 mg/dL   Creatinine, Ser 4.65 0.44 - 1.00 mg/dL   Calcium 8.2 (L) 8.9 - 10.3 mg/dL   Total Protein 7.8 6.5 - 8.1 g/dL   Albumin 4.0 3.5 - 5.0 g/dL   AST 25 15 - 41 U/L   ALT 24 0 - 44 U/L   Alkaline Phosphatase 67 38 - 126 U/L   Total Bilirubin 0.5 0.3 - 1.2 mg/dL   GFR, Estimated >03 >54 mL/min    Comment: (NOTE) Calculated using the CKD-EPI Creatinine Equation (2021)    Anion gap 6 5 - 15    Comment: Performed at [facility], [address] [place], [place] [address]  Lipase, blood     Status: None   Collection Time: 07/13/21  7:45 PM  Result Value Ref Range   Lipase 27 11 - 51 U/L    Comment: Performed at [facility], [address] [place], [place] [address]    CT Abdomen Pelvis W Contrast  Result Date: 07/13/2021 CLINICAL DATA:  Pelvic pain and urinary incontinence EXAM: CT ABDOMEN AND PELVIS WITH CONTRAST TECHNIQUE: Multidetector CT imaging of the abdomen and pelvis was performed using the standard protocol following bolus administration of intravenous contrast. CONTRAST:  OMNIPAQUE IOHEXOL 350 MG/ML SOLN COMPARISON:  05/20/2021 FINDINGS: Lower chest: No acute abnormality. Hepatobiliary: No focal liver abnormality is seen. No gallstones, gallbladder wall thickening, or biliary dilatation. Pancreas: Unremarkable. No pancreatic ductal dilatation or surrounding inflammatory changes. Spleen: Normal in size without focal abnormality. Adrenals/Urinary Tract: Adrenal glands are stable in appearance. Kidneys demonstrate a normal enhancement pattern bilaterally. No obstructive changes are seen. Ureters are within normal limits. The bladder is partially distended. Stomach/Bowel: There are changes again seen consistent with  diverticulitis in the sigmoid colon with smooth muscle hypertrophy and pericolonic fluid collection which now measures 2.3 cm in greatest dimension. It indents upon the superior aspect of the urinary bladder although no fistula is identified. These changes have worsened in the interval from the prior exam at which time the small abscess measured 1 cm. More proximal colon appears within normal limits. The appendix is unremarkable. Small bowel and stomach are unremarkable. Vascular/Lymphatic: No significant vascular findings are present. No enlarged abdominal or pelvic lymph nodes. Reproductive: Uterus and bilateral adnexa are unremarkable. Other: No abdominal wall hernia or abnormality. No abdominopelvic ascites. Musculoskeletal: No acute or significant osseous findings. IMPRESSION: Changes of diverticulitis with a focal abscess adjacent to the colon which is  increased in size now measuring 2.3 cm. It indents upon the superior aspect of the urinary bladder and focal bladder wall thickening is noted although no fistula is seen. Electronically Signed   By: Alcide Clever M.D.   On: 07/13/2021 21:15     A/P: Lori Mcknight is an 35 y.o. female with recurrent/persistent sigmoidal diverticulitis with pericolic abscess-too small to benefit from drain  -IV Zosyn -Monitor -We will follow with you

## 2021-07-13 NOTE — ED Notes (Signed)
Redraw lt. Green tube sent to lab

## 2021-07-13 NOTE — ED Triage Notes (Signed)
Pt c/o pelvic pain/bladder pain/urinary incontinence. Started fever and chills last night. Pt states this is usually signs of her diverticulitis.

## 2021-07-14 ENCOUNTER — Encounter (HOSPITAL_COMMUNITY): Payer: Self-pay | Admitting: Internal Medicine

## 2021-07-14 DIAGNOSIS — K5720 Diverticulitis of large intestine with perforation and abscess without bleeding: Principal | ICD-10-CM

## 2021-07-14 DIAGNOSIS — I10 Essential (primary) hypertension: Secondary | ICD-10-CM

## 2021-07-14 LAB — CBC
HCT: 45.5 % (ref 36.0–46.0)
Hemoglobin: 15 g/dL (ref 12.0–15.0)
MCH: 32.2 pg (ref 26.0–34.0)
MCHC: 33 g/dL (ref 30.0–36.0)
MCV: 97.6 fL (ref 80.0–100.0)
Platelets: 151 10*3/uL (ref 150–400)
RBC: 4.66 MIL/uL (ref 3.87–5.11)
RDW: 12.6 % (ref 11.5–15.5)
WBC: 6.7 10*3/uL (ref 4.0–10.5)
nRBC: 0 % (ref 0.0–0.2)

## 2021-07-14 LAB — BASIC METABOLIC PANEL WITH GFR
Anion gap: 8 (ref 5–15)
BUN: 9 mg/dL (ref 6–20)
CO2: 27 mmol/L (ref 22–32)
Calcium: 8.9 mg/dL (ref 8.9–10.3)
Chloride: 108 mmol/L (ref 98–111)
Creatinine, Ser: 0.7 mg/dL (ref 0.44–1.00)
GFR, Estimated: >60 mL/min
Glucose, Bld: 95 mg/dL (ref 70–99)
Potassium: 3.4 mmol/L — ABNORMAL LOW (ref 3.5–5.1)
Sodium: 143 mmol/L (ref 135–145)

## 2021-07-14 MED ORDER — LEVOTHYROXINE SODIUM 88 MCG PO TABS
88.0000 ug | ORAL_TABLET | Freq: Every day | ORAL | Status: DC
  Administered 2021-07-14 – 2021-07-17 (×4): 88 ug via ORAL
  Filled 2021-07-14 (×4): qty 1

## 2021-07-14 MED ORDER — ALBUTEROL SULFATE (2.5 MG/3ML) 0.083% IN NEBU: 2.5000 mg | INHALATION_SOLUTION | Freq: Four times a day (QID) | RESPIRATORY_TRACT | Status: DC | PRN

## 2021-07-14 MED ORDER — ONDANSETRON HCL 4 MG/2ML IJ SOLN: 4.0000 mg | Freq: Four times a day (QID) | INTRAMUSCULAR | Status: DC | PRN

## 2021-07-14 MED ORDER — ONDANSETRON HCL 4 MG PO TABS: 4.0000 mg | ORAL_TABLET | Freq: Four times a day (QID) | ORAL | Status: DC | PRN

## 2021-07-14 MED ORDER — ACETAMINOPHEN 650 MG RE SUPP: 650.0000 mg | Freq: Four times a day (QID) | RECTAL | Status: DC | PRN

## 2021-07-14 MED ORDER — ACETAMINOPHEN 325 MG PO TABS
650.0000 mg | ORAL_TABLET | Freq: Four times a day (QID) | ORAL | Status: DC | PRN
  Administered 2021-07-14 (×2): 650 mg via ORAL
  Filled 2021-07-14 (×3): qty 2

## 2021-07-14 MED ORDER — LACTATED RINGERS IV SOLN: INTRAVENOUS | Status: DC

## 2021-07-14 MED ORDER — ENOXAPARIN SODIUM 40 MG/0.4ML IJ SOSY
40.0000 mg | PREFILLED_SYRINGE | INTRAMUSCULAR | Status: DC
  Administered 2021-07-14 – 2021-07-16 (×3): 40 mg via SUBCUTANEOUS
  Filled 2021-07-14 (×3): qty 0.4

## 2021-07-14 MED ORDER — MORPHINE SULFATE (PF) 2 MG/ML IV SOLN
2.0000 mg | INTRAVENOUS | Status: DC | PRN
  Administered 2021-07-14 – 2021-07-15 (×2): 2 mg via INTRAVENOUS
  Filled 2021-07-14 (×3): qty 1

## 2021-07-14 NOTE — H&P (Signed)
History and Physical    Lori Mcknight NLZ:[id number] DOB: Jan 03, 1986 DOA: 07/13/2021  PCP: Claiborne Rigg, NP  Patient coming from: Home  I have personally briefly reviewed patient's old medical records in [facility]  Chief Complaint: Abd pain  HPI: Lori Mcknight is a 35 y.o. female with medical history significant of morbid obesity, HTN, diverticulitis.  Pt presents to ED with recurrent LLQ abd pain.  Admitted for same pain diagnosed as diverticulitis with abscess at end of July.  Diverticulitis again in Aug with abscess having improved significantly in size.  Now presents to [facility] with recurrent symptoms.  Tm 100 at home yesterday, chills.  No N/V.  Pain is sharp and aching.  Hasnt been able to get colonoscopy follow up due to "orange card" status.   ED Course: Pt with diverticulitis, abscess has enlarged again.  Put on zosyn.   Review of Systems: As per HPI, otherwise all review of systems negative.  Past Medical History:  Diagnosis Date   Anemia    history of anemia   Anxiety    Asthma    inhaler as needed   Chest pain of uncertain etiology    intermittent left side chest pain   Chlamydia    Depression    Diverticulitis 2019   Endometriosis    Genital herpes    Headache    MIGRAINES   Hypertension    no meds since April   Hypothyroidism    Obese    Obesity, Class III, BMI 40-49.9 (morbid obesity) (HCC) 11/06/2011   PCOS (polycystic ovarian syndrome)    PONV (postoperative nausea and vomiting)    Shortness of breath    on exertion from weight    Past Surgical History:  Procedure Laterality Date   DILATION AND CURETTAGE OF UTERUS N/A 04/29/2018   Procedure: DILATATION AND CURETTAGE;  Surgeon: Allie Bossier, MD;  Location: [facility];  Service: Gynecology;  Laterality: N/A;   HYSTEROSCOPY WITH D & C N/A 04/06/2014   Procedure: DILATATION AND CURETTAGE /HYSTEROSCOPY ;  Surgeon: Tereso Newcomer, MD;  Location: [facility];  Service: Gynecology;  Laterality:  N/A;   LAPAROSCOPY N/A 04/29/2018   Procedure: LAPAROSCOPY DIAGNOSTIC WITH PERITONEAL BIOPSY;  Surgeon: Allie Bossier, MD;  Location: [facility];  Service: Gynecology;  Laterality: N/A;   LEFT HEART CATH AND CORONARY ANGIOGRAPHY N/A 12/12/2016   Procedure: Left Heart Cath and Coronary Angiography;  Surgeon: Peter M Swaziland, MD;  Location: [facility] INVASIVE CV LAB;  Service: Cardiovascular;  Laterality: N/A;   WISDOM TOOTH EXTRACTION       reports that she has been smoking cigarettes. She has been smoking an average of .25 packs per day. She has never used smokeless tobacco. She reports that she does not currently use alcohol. She reports that she does not currently use drugs after having used the following drugs: Marijuana.  Allergies  Allergen Reactions   Dilaudid [Hydromorphone] Rash    Itching all over body    Family History  Problem Relation Age of Onset   Heart disease Mother    Sleep apnea Mother    Depression Mother    Hypertension Mother    Kidney disease Mother    Cancer Father    Diabetes Father    Heart disease Father    Colon cancer Father        11s   Hypertension Father    Diabetes Sister    Hypertension Sister    Heart disease Maternal  Grandmother    Diabetes Maternal Grandfather    Heart disease Maternal Grandfather    Colon cancer Cousin        died age 39, paternal cousin     Prior to Admission medications   Medication Sig Start Date End Date Taking? Authorizing Provider  acetaminophen (TYLENOL) 325 MG tablet Take 2 tablets (650 mg total) by mouth every 6 (six) hours as needed for mild pain or fever. 05/22/21   Claiborne Rigg, NP  albuterol (VENTOLIN HFA) 108 (90 Base) MCG/ACT inhaler Inhale 2 puffs into the lungs every 6 (six) hours as needed for wheezing or shortness of breath. 05/22/21   Claiborne Rigg, NP  cyclobenzaprine (FLEXERIL) 10 MG tablet Take 1 tablet (10 mg total) by mouth at bedtime. 05/06/21   Claiborne Rigg, NP  gabapentin (NEURONTIN) 300 MG capsule  Take 1 capsule (300 mg total) by mouth 2 (two) times daily. 08/28/20   Rema Fendt, NP  ibuprofen (ADVIL) 200 MG tablet Take 800 mg by mouth every 6 (six) hours as needed for moderate pain.    [provider]  levothyroxine (SYNTHROID) 88 MCG tablet Take 1 tablet (88 mcg total) by mouth daily before breakfast. 05/15/21 06/14/21  Lanae Boast, MD  medroxyPROGESTERone (PROVERA) 10 MG tablet Take 1 tablet (10 mg total) by mouth daily. Patient not taking: No sig reported 11/19/20   Jerene Bears, MD  oxyCODONE (ROXICODONE) 5 MG immediate release tablet Take 1 tablet (5 mg total) by mouth every 6 (six) hours as needed for up to 10 doses for breakthrough pain. 05/20/21   Curatolo, Adam, DO  promethazine-dextromethorphan (PROMETHAZINE-DM) 6.25-15 MG/5ML syrup Take 5 mLs by mouth 4 (four) times daily as needed for cough. 05/22/21   Claiborne Rigg, NP    Physical Exam: Vitals:   07/13/21 2245 07/13/21 2300 07/13/21 2315 07/13/21 2353  BP: 120/68 118/67 111/71 129/79  Pulse: 82 77 79 73  Resp: 18 18 18 16   Temp:   98.5 F (36.9 C) 98.1 F (36.7 C)  TempSrc:   Oral Oral  SpO2: 99% 100% 100% 100%    Constitutional: NAD, calm, comfortable Eyes: PERRL, lids and conjunctivae normal ENMT: Mucous membranes are moist. Posterior pharynx clear of any exudate or lesions.Normal dentition.  Neck: normal, supple, no masses, no thyromegaly Respiratory: clear to auscultation bilaterally, no wheezing, no crackles. Normal respiratory effort. No accessory muscle use.  Cardiovascular: Regular rate and rhythm, no murmurs / rubs / gallops. No extremity edema. 2+ pedal pulses. No carotid bruits.  Abdomen: LLQ TTP Musculoskeletal: no clubbing / cyanosis. No joint deformity upper and lower extremities. Good ROM, no contractures. Normal muscle tone.  Skin: no rashes, lesions, ulcers. No induration Neurologic: CN 2-12 grossly intact. Sensation intact, DTR normal. Strength 5/5 in all 4.  Psychiatric: Normal judgment  and insight. Alert and oriented x 3. Normal mood.    Labs on Admission: I have personally reviewed following labs and imaging studies  CBC: Recent Labs  Lab 07/13/21 1824  WBC 7.0  NEUTROABS 3.9  HGB 15.0  HCT 45.0  MCV 94.9  PLT 167   Basic Metabolic Panel: Recent Labs  Lab 07/13/21 1945  NA 138  K 3.4*  CL 106  CO2 26  GLUCOSE 107*  BUN 9  CREATININE 0.70  CALCIUM 8.2*   GFR: CrCl cannot be calculated (Unknown ideal weight.). Liver Function Tests: Recent Labs  Lab 07/13/21 1945  AST 25  ALT 24  ALKPHOS 67  BILITOT  0.5  PROT 7.8  ALBUMIN 4.0   Recent Labs  Lab 07/13/21 1945  LIPASE 27   No results for input(s): AMMONIA in the last 168 hours. Coagulation Profile: No results for input(s): INR, PROTIME in the last 168 hours. Cardiac Enzymes: No results for input(s): CKTOTAL, CKMB, CKMBINDEX, TROPONINI in the last 168 hours. BNP (last 3 results) No results for input(s): PROBNP in the last 8760 hours. HbA1C: No results for input(s): HGBA1C in the last 72 hours. CBG: No results for input(s): GLUCAP in the last 168 hours. Lipid Profile: No results for input(s): CHOL, HDL, LDLCALC, TRIG, CHOLHDL, LDLDIRECT in the last 72 hours. Thyroid Function Tests: No results for input(s): TSH, T4TOTAL, FREET4, T3FREE, THYROIDAB in the last 72 hours. Anemia Panel: No results for input(s): VITAMINB12, FOLATE, FERRITIN, TIBC, IRON, RETICCTPCT in the last 72 hours. Urine analysis:    Component Value Date/Time   COLORURINE YELLOW 07/13/2021 1739   APPEARANCEUR HAZY (A) 07/13/2021 1739   APPEARANCEUR Clear 03/08/2020 1515   LABSPEC 1.027 07/13/2021 1739   PHURINE 6.0 07/13/2021 1739   GLUCOSEU NEGATIVE 07/13/2021 1739   HGBUR NEGATIVE 07/13/2021 1739   BILIRUBINUR NEGATIVE 07/13/2021 1739   BILIRUBINUR Negative 03/08/2020 1515   KETONESUR NEGATIVE 07/13/2021 1739   PROTEINUR 30 (A) 07/13/2021 1739   UROBILINOGEN 0.2 07/29/2019 1321   NITRITE NEGATIVE 07/13/2021  1739   LEUKOCYTESUR NEGATIVE 07/13/2021 1739    Radiological Exams on Admission: CT Abdomen Pelvis W Contrast  Result Date: 07/13/2021 CLINICAL DATA:  Pelvic pain and urinary incontinence EXAM: CT ABDOMEN AND PELVIS WITH CONTRAST TECHNIQUE: Multidetector CT imaging of the abdomen and pelvis was performed using the standard protocol following bolus administration of intravenous contrast. CONTRAST:  OMNIPAQUE IOHEXOL 350 MG/ML SOLN COMPARISON:  05/20/2021 FINDINGS: Lower chest: No acute abnormality. Hepatobiliary: No focal liver abnormality is seen. No gallstones, gallbladder wall thickening, or biliary dilatation. Pancreas: Unremarkable. No pancreatic ductal dilatation or surrounding inflammatory changes. Spleen: Normal in size without focal abnormality. Adrenals/Urinary Tract: Adrenal glands are stable in appearance. Kidneys demonstrate a normal enhancement pattern bilaterally. No obstructive changes are seen. Ureters are within normal limits. The bladder is partially distended. Stomach/Bowel: There are changes again seen consistent with diverticulitis in the sigmoid colon with smooth muscle hypertrophy and pericolonic fluid collection which now measures 2.3 cm in greatest dimension. It indents upon the superior aspect of the urinary bladder although no fistula is identified. These changes have worsened in the interval from the prior exam at which time the small abscess measured 1 cm. More proximal colon appears within normal limits. The appendix is unremarkable. Small bowel and stomach are unremarkable. Vascular/Lymphatic: No significant vascular findings are present. No enlarged abdominal or pelvic lymph nodes. Reproductive: Uterus and bilateral adnexa are unremarkable. Other: No abdominal wall hernia or abnormality. No abdominopelvic ascites. Musculoskeletal: No acute or significant osseous findings. IMPRESSION: Changes of diverticulitis with a focal abscess adjacent to the colon which is increased  in size now measuring 2.3 cm. It indents upon the superior aspect of the urinary bladder and focal bladder wall thickening is noted although no fistula is seen. Electronically Signed   By: Alcide Clever M.D.   On: 07/13/2021 21:15    EKG: Independently reviewed.  Assessment/Plan Principal Problem:   Diverticulitis of large intestine with abscess Active Problems:   Essential hypertension    Diverticulitis with abscess - Abscess enlarged from Aug CT NPO IVF Empiric zosyn Morphine PRN pain Zofran PRN nausea Gen surg consulted by EDP Will  get SW consult to see if can find a GI provider that accepts pt insurance as she needs colonoscopy 6 weeks after resolution. HTN - Med rec pending Doesn't appear to be on any chronic BP meds  DVT prophylaxis: Lovenox Code Status: Full Family Communication: No family in room Disposition Plan: Home after diverticulitis resolved Consults called: EDP spoke with gen surg Dr. Cliffton Asters Admission status: Admit to inpatient  Severity of Illness: The appropriate patient status for this patient is INPATIENT. Inpatient status is judged to be reasonable and necessary in order to provide the required intensity of service to ensure the patient's safety. The patient's presenting symptoms, physical exam findings, and initial radiographic and laboratory data in the context of their chronic comorbidities is felt to place them at high risk for further clinical deterioration. Furthermore, it is not anticipated that the patient will be medically stable for discharge from the hospital within 2 midnights of admission. The following factors support the patient status of inpatient.   IP status due to diverticulitis complicated by abscess.  Recurrent diverticulitis.   * I certify that at the point of admission it is my clinical judgment that the patient will require inpatient hospital care spanning beyond 2 midnights from the point of admission due to high intensity of service,  high risk for further deterioration and high frequency of surveillance required.*   Mariena Meares M. DO [facility]  How to contact the [facility] Attending or Consulting provider 7A - 7P or covering provider during after hours 7P -7A, for this patient?  Check the care team in [facility] and look for a) attending/consulting [facility] provider listed and b) the [facility] team listed Log into [email or web address]  Amion Physician Scheduling and messaging for groups and whole hospitals  On call and physician scheduling software for group practices, residents, hospitalists and other medical providers for call, clinic, rotation and shift schedules. On[employer] is a hospital-wide system for scheduling doctors and paging doctors on call. EasyPlot is for scientific plotting and data analysis.  [email or web address]  and use [place]'s universal password to access. If you do not have the password, please contact the hospital operator.  Locate the [facility] provider you are looking for under [facility] and page to a number that you can be directly reached. If you still have difficulty reaching the provider, please page the [facility] (Director on Call) for the Hospitalists listed on amion for assistance.  07/14/2021, 12:49 AM

## 2021-07-14 NOTE — Plan of Care (Signed)
  Problem: Clinical Measurements: °Goal: Ability to maintain clinical measurements within normal limits will improve °Outcome: Progressing °  °Problem: Clinical Measurements: °Goal: Will remain free from infection °Outcome: Progressing °  °Problem: Nutrition: °Goal: Adequate nutrition will be maintained °Outcome: Progressing °  °Problem: Pain Managment: °Goal: General experience of comfort will improve °Outcome: Progressing °  °

## 2021-07-14 NOTE — Progress Notes (Signed)
35yo F admitted by Dr. Julian Reil last night for abdominal pain due to enlarging abscess complicating perforated diverticulitis. She has a history of hypothyroidism for which synthroid has been continued. Surgery is following, pt is on zosyn with normal vital signs. Discussed briefly with Dr. Dwain Sarna who agrees to assume role of primary team for this patient. Please contact hospitalist team if there are any medical issues that arise during this hospitalization.   Hazeline Junker, MD 07/14/2021 2:56 PM

## 2021-07-15 MED ORDER — POLYETHYLENE GLYCOL 3350 17 G PO PACK
17.0000 g | PACK | Freq: Two times a day (BID) | ORAL | Status: DC
  Administered 2021-07-15: 17 g via ORAL
  Filled 2021-07-15: qty 1

## 2021-07-15 NOTE — Progress Notes (Signed)
Diverticulitis of large intestine with abscess  Subjective: Pain improved.  Had a small BM.  Pt having trouble getting a GI apt due to finances and red card coverage   Objective: Vital signs in last 24 hours: Temp:  [97.3 F (36.3 C)-97.9 F (36.6 C)] 97.8 F (36.6 C) (10/03 0538) Pulse Rate:  [65-81] 75 (10/03 0538) Resp:  [16-18] 16 (10/03 0538) BP: (105-135)/(48-82) 120/48 (10/03 0538) SpO2:  [99 %-100 %] 99 % (10/03 0538) Last BM Date: 07/14/21  Intake/Output from previous day: 10/02 0701 - 10/03 0700 In: 4010.7 [I.V.:3860.6; IV Piggyback:150] Out: 1350 [Urine:1350] Intake/Output this shift: No intake/output data recorded.  General appearance: alert and cooperative GI: soft  Lab Results:  No results found for this or any previous visit (from the past 24 hour(s)).   Studies/Results Radiology     MEDS, Scheduled  enoxaparin (LOVENOX) injection  40 mg Subcutaneous Q24H   levothyroxine  88 mcg Oral QAC breakfast   polyethylene glycol  17 g Oral BID     Assessment: Diverticulitis of large intestine with abscess Abscess does not appear to be large enough for drain  Plan: Cont IV abx Recheck cbc in AM Miralax BID Try Fulls today Ambulate in hall   LOS: 1 day    Vanita Panda, MD [facility], [place]    07/15/2021 8:46 AM

## 2021-07-16 LAB — CBC
HCT: 41.4 % (ref 36.0–46.0)
Hemoglobin: 13.8 g/dL (ref 12.0–15.0)
MCH: 31.8 pg (ref 26.0–34.0)
MCHC: 33.3 g/dL (ref 30.0–36.0)
MCV: 95.4 fL (ref 80.0–100.0)
Platelets: 176 10*3/uL (ref 150–400)
RBC: 4.34 MIL/uL (ref 3.87–5.11)
RDW: 12.3 % (ref 11.5–15.5)
WBC: 4.9 10*3/uL (ref 4.0–10.5)
nRBC: 0 % (ref 0.0–0.2)

## 2021-07-16 LAB — URINALYSIS, ROUTINE W REFLEX MICROSCOPIC
Bilirubin Urine: NEGATIVE
Glucose, UA: NEGATIVE mg/dL
Ketones, ur: 5 mg/dL — AB
Leukocytes,Ua: NEGATIVE
Nitrite: NEGATIVE
Protein, ur: NEGATIVE mg/dL
Specific Gravity, Urine: 1.011 (ref 1.005–1.030)
pH: 5 (ref 5.0–8.0)

## 2021-07-16 MED ORDER — OXYCODONE HCL 5 MG PO TABS
5.0000 mg | ORAL_TABLET | ORAL | Status: DC | PRN
  Administered 2021-07-16: 5 mg via ORAL
  Filled 2021-07-16: qty 1

## 2021-07-16 MED ORDER — POLYETHYLENE GLYCOL 3350 17 G PO PACK: 17.0000 g | PACK | Freq: Every day | ORAL | Status: DC

## 2021-07-16 MED ORDER — AMOXICILLIN-POT CLAVULANATE 875-125 MG PO TABS
1.0000 | ORAL_TABLET | Freq: Two times a day (BID) | ORAL | Status: DC
  Administered 2021-07-16: 1 via ORAL
  Filled 2021-07-16: qty 1

## 2021-07-16 MED ORDER — PHENAZOPYRIDINE HCL 100 MG PO TABS
100.0000 mg | ORAL_TABLET | Freq: Three times a day (TID) | ORAL | Status: DC
  Administered 2021-07-16 – 2021-07-17 (×3): 100 mg via ORAL
  Filled 2021-07-16 (×3): qty 1

## 2021-07-16 NOTE — Progress Notes (Signed)
Progress Note     Subjective: Having some dark loose stools and some abdominal cramping with this. No blood. Abdominal pain overall improving. Mild nausea without emesis on FLD. Ambulating. She has some bladder pressure/pain and dysuria without hematuria   Objective: Vital signs in last 24 hours: Temp:  [97.5 F (36.4 C)-98.2 F (36.8 C)] 97.5 F (36.4 C) (10/04 0554) Pulse Rate:  [75-89] 89 (10/04 0554) Resp:  [16-18] 18 (10/04 0554) BP: (128-139)/(81-94) 128/94 (10/04 0554) SpO2:  [84 %-100 %] 84 % (10/04 0554) Last BM Date: 07/16/21  Intake/Output from previous day: 10/03 0701 - 10/04 0700 In: 3038.1 [P.O.:1280; I.V.:1634.8; IV Piggyback:123.4] Out: -  Intake/Output this shift: No intake/output data recorded.  PE: General: pleasant, WD, female who is laying in bed in NAD HEENT: head is normocephalic, atraumatic. Mouth is pink and moist Heart: regular, rate, and rhythm. Palpable radial and pedal pulses bilaterally Lungs: CTAB.  Respiratory effort nonlabored Abd: soft, ND, +BS. Very mild TTP over LLQ MSK: all 4 extremities are symmetrical with no cyanosis, clubbing, or edema. Skin: warm and dry with no masses, lesions, or rashes Psych: A&Ox3 with an appropriate affect.    Lab Results:  Recent Labs    07/13/21 1824 07/14/21 0448  WBC 7.0 6.7  HGB 15.0 15.0  HCT 45.0 45.5  PLT 167 151   BMET Recent Labs    07/13/21 1945 07/14/21 0448  NA 138 143  K 3.4* 3.4*  CL 106 108  CO2 26 27  GLUCOSE 107* 95  BUN 9 9  CREATININE 0.70 0.70  CALCIUM 8.2* 8.9   PT/INR No results for input(s): LABPROT, INR in the last 72 hours. CMP     Component Value Date/Time   NA 143 07/14/2021 0448   NA 141 12/09/2016 1113   K 3.4 (L) 07/14/2021 0448   CL 108 07/14/2021 0448   CO2 27 07/14/2021 0448   GLUCOSE 95 07/14/2021 0448   BUN 9 07/14/2021 0448   BUN 10 12/09/2016 1113   CREATININE 0.70 07/14/2021 0448   CREATININE 0.84 10/20/2016 1205   CALCIUM 8.9  07/14/2021 0448   PROT 7.8 07/13/2021 1945   PROT 7.8 11/12/2016 1042   ALBUMIN 4.0 07/13/2021 1945   ALBUMIN 4.0 11/12/2016 1042   AST 25 07/13/2021 1945   ALT 24 07/13/2021 1945   ALKPHOS 67 07/13/2021 1945   BILITOT 0.5 07/13/2021 1945   BILITOT 0.6 11/12/2016 1042   GFRNONAA >60 07/14/2021 0448   GFRNONAA >89 10/20/2016 1205   GFRAA >60 03/31/2020 0543   GFRAA >89 10/20/2016 1205   Lipase     Component Value Date/Time   LIPASE 27 07/13/2021 1945       Studies/Results: No results found.  Anti-infectives: Anti-infectives (From admission, onward)    Start     Dose/Rate Route Frequency Ordered Stop   07/13/21 2145  piperacillin-tazobactam (ZOSYN) IVPB 3.375 g        3.375 g 12.5 mL/hr over 240 Minutes Intravenous Every 8 hours 07/13/21 2135          Assessment/Plan  Diverticulitis of large intestine with abscess - Abscess does not appear to be large enough for drain  - Cont IV abx - repeat CBC pending - Miralax BID - tolerated FLD, advance to soft - Ambulate in hall - await social work consult - check UA due to dysuria - suspect bladder symptoms are related to diverticulitis and low suspicion for infection given current abx. Start pyridium after UA  FEN:  soft, IVF @ 50 ml/hr ID: zosyn 10/1> VTE: lovenox   LOS: 2 days    Eric Form, [facility] 07/16/2021, 7:58 AM Please see Amion for pager number during day hours 7:00am-4:30pm

## 2021-07-16 NOTE — Progress Notes (Signed)
PHARMACY NOTE -  zosyn  Pharmacy has been assisting with dosing of zosyn for diverticulitis of large intestine with abscess. Dosage remains stable at zosyn 3.375 gm IV q8h (infuse over 4 hrs) and need for further dosage adjustment appears unlikely at present.    Will sign off at this time.  Please reconsult if a change in clinical status warrants re-evaluation of dosage.  Dorna Leitz, PharmD, BCPS 07/16/2021 10:07 AM

## 2021-07-16 NOTE — TOC Progression Note (Signed)
Transition of Care [facility]) - Progression Note    Patient Details  Name: [place] C Bahar MRN: [id number] Date of Birth: 07-Jul-1986  [facility] of [facility]) CM/SW Contact  Lennart Pall, LCSW Phone Number: 07/16/2021, 3:27 PM  Clinical Narrative:    TOC referral placed to assist with referral to GI as pt is uninsured.  Met with pt and confirmed that she is followed at [facility] for primary care.  She has also worked with the case Freight forwarder there, Eden Lathe.  With pt agreement, I have sent message to CM to alert to need for specialist/ GI referral to see if she might be able to guide patient on how to do this.  Will ask TOC coverage tomorrow to see if any response received and have spoken with pt about need to follow up with CM as well.        Expected Discharge Plan and Services                                                 Social Determinants of Health (SDOH) Interventions    Readmission Risk Interventions No flowsheet data found.

## 2021-07-17 DIAGNOSIS — R1032 Left lower quadrant pain: Secondary | ICD-10-CM | POA: Diagnosis present

## 2021-07-17 MED ORDER — AMOXICILLIN-POT CLAVULANATE 875-125 MG PO TABS: 1.0000 | ORAL_TABLET | Freq: Two times a day (BID) | ORAL | 0 refills | Status: AC

## 2021-07-17 MED ORDER — IBUPROFEN 200 MG PO TABS: 600.0000 mg | ORAL_TABLET | Freq: Four times a day (QID) | ORAL | 0 refills | Status: DC | PRN

## 2021-07-17 MED ORDER — POLYETHYLENE GLYCOL 3350 17 G PO PACK: 17.0000 g | PACK | Freq: Two times a day (BID) | ORAL | 0 refills | Status: DC | PRN

## 2021-07-17 MED ORDER — DOCUSATE SODIUM 100 MG PO CAPS: 100.0000 mg | ORAL_CAPSULE | Freq: Two times a day (BID) | ORAL | Status: DC | PRN

## 2021-07-17 MED ORDER — PHENAZOPYRIDINE HCL 100 MG PO TABS: 100.0000 mg | ORAL_TABLET | Freq: Three times a day (TID) | ORAL | 0 refills | Status: AC

## 2021-07-17 NOTE — Progress Notes (Signed)
Discharge instructions discussed with patient verbalized understanding and agreement

## 2021-07-17 NOTE — Discharge Summary (Signed)
[facility] Discharge Summary   Patient ID: Lori Mcknight MRN: [id number] DOB/AGE: [birth year] 35 y.o.  Admit date: 07/13/2021 Discharge date: 07/17/2021  Admitting Diagnosis: Diverticulitis [K57.92] Diverticulitis of intestine with abscess [K57.80] Left lower quadrant abdominal pain [R10.32]   Discharge Diagnosis Patient Active Problem List   Diagnosis Date Noted   Diverticulitis of large intestine with abscess 05/09/2021   Obesity, morbid, BMI 50 or higher (HCC) 08/08/2019   Dysuria 08/08/2019   Consultants [facility]  Imaging: No results found.  Procedures none  Hospital Course:  35 year old female who presented to [facility] ED with history of diverticulitis with abscess - recently discharged 05/2021 with 1 cm perisigmoidal abscess returned to ED with same symptoms of 1-2 days of left lower quadrant pain and temperature at home of 100F.  Workup showed known abscess - too small to benefit from drain.  Patient was admitted to the hospitalist service initially but transferred to our care. She was managed with bowel rest and IV antibiotics - zosyn. Diet was slowly advanced which she tolerated well.  She did develop some dysuria which she had previously with her diverticulitis symptoms and UA was checked without concern for infection. She was started on pyridium for pain control. On date of discharge, the patient was voiding well, tolerating diet, ambulating well, pain well controlled, vital signs stable, and felt stable for discharge home. She will discharge on Augmentin x14 days, pyridium, and with instructions to take OTC miralax and stool softeners to keep bowel movements soft and regular. She had been unable to get a colonoscopy due to her insurance not being accepted and our office/Dr. gross is working on scheduling her follow up and further management. She will call to confirm follow up  Physical Exam: General:  Alert, NAD, pleasant, comfortable Cards:  palpable radial pulses bilaterally Resp: CTAB, effort normal on room air Abd:  Soft, ND, +BS. No TTP MSK: bilateral upper and lower extremities without edema or cyanosis. Bilateral calves soft without TTP Skin: warm and dry Psych: A&O x3 with appropriate affect   Allergies as of 07/17/2021       Reactions   Dilaudid [hydromorphone] Rash   Itching all over body        Medication List     TAKE these medications    acetaminophen 325 MG tablet Commonly known as: TYLENOL Take 2 tablets (650 mg total) by mouth every 6 (six) hours as needed for mild pain or fever.   albuterol 108 (90 Base) MCG/ACT inhaler Commonly known as: VENTOLIN HFA Inhale 2 puffs into the lungs every 6 (six) hours as needed for wheezing or shortness of breath.   amoxicillin-clavulanate 875-125 MG tablet Commonly known as: AUGMENTIN Take 1 tablet by mouth every 12 (twelve) hours for 13 days.   cyclobenzaprine 10 MG tablet Commonly known as: FLEXERIL Take 1 tablet (10 mg total) by mouth at bedtime.   docusate sodium 100 MG capsule Commonly known as: Colace Take 1 capsule (100 mg total) by mouth 2 (two) times daily as needed.   gabapentin 300 MG capsule Commonly known as: NEURONTIN Take 1 capsule (300 mg total) by mouth 2 (two) times daily.   ibuprofen 200 MG tablet Commonly known as: ADVIL Take 3 tablets (600 mg total) by mouth every 6 (six) hours as needed for moderate pain. What changed: how much to take   Euthyrox 88 MCG tablet Generic drug: levothyroxine Take 88 mcg by mouth daily before breakfast.   levothyroxine 88 MCG  tablet Commonly known as: SYNTHROID Take 1 tablet (88 mcg total) by mouth daily before breakfast.   medroxyPROGESTERone 10 MG tablet Commonly known as: PROVERA Take 1 tablet (10 mg total) by mouth daily.   phenazopyridine 100 MG tablet Commonly known as: PYRIDIUM Take 1 tablet (100 mg total) by mouth 3 (three) times daily with meals for 2 days.   polyethylene glycol  17 g packet Commonly known as: MIRALAX / GLYCOLAX Take 17 g by mouth 2 (two) times daily as needed for moderate constipation or severe constipation.          Follow-up Information     [facility] Call.   Specialty: General Surgery Why: call to arrange follow up as needed Contact information: [address] [place] [address] [phone]                 Signed: Eric Form , [facility] 07/17/2021, 10:08 AM Please see Amion for pager number during day hours 7:00am-4:30pm

## 2021-07-17 NOTE — TOC Transition Note (Addendum)
Transition of Care [facility]) - CM/SW Discharge Note   Patient Details  Name: [place] C Nordahl MRN: [id number] Date of Birth: 06-26-1986  Transition [facility]) CM/SW Contact:  Barry Brunner, LCSW Phone Number: 07/17/2021, 3:21 PM   Clinical Narrative:    CSW notified of patient's need for a follow up GI appointment given her insurance status. CSW followed up with [facility]. Robyne Peers reported that patient was referred to [facility] for her GI appointment in May, but an appointment would need to be scheduled for patient to obtain a sooner date. CSW contacted Peyton Bottoms to schedule appointment. Arna Medici scheduled appointment for 08/05/2021 for 2:30 pm. CSW notified patient. Patient agreeable to attend appointment. TOC to follow.          Patient Goals and CMS Choice        Discharge Placement                       Discharge Plan and Services                                     Social Determinants of Health (SDOH) Interventions     Readmission Risk Interventions No flowsheet data found.

## 2021-07-18 ENCOUNTER — Telehealth: Payer: Self-pay

## 2021-07-18 NOTE — Telephone Encounter (Signed)
Transition Care Management Unsuccessful Follow-up Telephone Call  Date of discharge and from where:  07/17/2021, [facility]   Attempts:  1st Attempt  Reason for unsuccessful TCM follow-up call:  Left voice message on # [phone].  Patient has appointment with Thalia Party @ [facility] 08/05/2021.   She needs to schedule an appointment with [facility]

## 2021-07-18 NOTE — Telephone Encounter (Signed)
Pt wanted to know if she needs a referral to schedule an appt with GI, and if she can do this since she has an [place] card.

## 2021-07-18 NOTE — Telephone Encounter (Signed)
Needs appt first. Patient aware.

## 2021-07-19 ENCOUNTER — Telehealth: Payer: Self-pay

## 2021-07-19 NOTE — Telephone Encounter (Signed)
Transition Care Management Unsuccessful Follow-up Telephone Call   Date of discharge and from where:  07/17/2021, [facility]    Attempts:  2nd Attempt   Reason for unsuccessful TCM follow-up call:  Left voice message on # [phone].   Patient has appointment scheduled with PCP 08/05/2021.    She needs to schedule an appointment with [facility]

## 2021-07-22 ENCOUNTER — Telehealth: Payer: Self-pay

## 2021-07-22 NOTE — Telephone Encounter (Signed)
Transition Care Management Unsuccessful Follow-up Telephone Call  Date of discharge and from where:  07/17/2021, [facility]   Attempts:  3rd Attempt  Reason for unsuccessful TCM follow-up call:  Left voice message on # [phone].  Patient has appointment scheduled with PCP 08/05/2021.    She needs to schedule an appointment with [facility]

## 2021-08-05 ENCOUNTER — Ambulatory Visit: Payer: Self-pay | Admitting: Nurse Practitioner

## 2021-08-14 ENCOUNTER — Encounter (HOSPITAL_BASED_OUTPATIENT_CLINIC_OR_DEPARTMENT_OTHER): Payer: Self-pay

## 2021-08-15 ENCOUNTER — Other Ambulatory Visit (HOSPITAL_BASED_OUTPATIENT_CLINIC_OR_DEPARTMENT_OTHER): Payer: Self-pay | Admitting: *Deleted

## 2021-08-15 DIAGNOSIS — N926 Irregular menstruation, unspecified: Secondary | ICD-10-CM

## 2021-08-15 MED ORDER — MEDROXYPROGESTERONE ACETATE 10 MG PO TABS: 10.0000 mg | ORAL_TABLET | Freq: Every day | ORAL | 0 refills | Status: DC

## 2021-08-28 ENCOUNTER — Other Ambulatory Visit: Payer: Self-pay

## 2021-08-28 ENCOUNTER — Emergency Department (HOSPITAL_BASED_OUTPATIENT_CLINIC_OR_DEPARTMENT_OTHER): Payer: Self-pay | Admitting: Radiology

## 2021-08-28 ENCOUNTER — Emergency Department (HOSPITAL_BASED_OUTPATIENT_CLINIC_OR_DEPARTMENT_OTHER): Payer: Self-pay

## 2021-08-28 ENCOUNTER — Emergency Department (HOSPITAL_BASED_OUTPATIENT_CLINIC_OR_DEPARTMENT_OTHER)
Admission: EM | Admit: 2021-08-28 | Discharge: 2021-08-28 | Disposition: A | Discharge status: Home / Self Care | Payer: Self-pay | Attending: Emergency Medicine | Admitting: Emergency Medicine

## 2021-08-28 ENCOUNTER — Encounter (HOSPITAL_BASED_OUTPATIENT_CLINIC_OR_DEPARTMENT_OTHER): Payer: Self-pay | Admitting: Emergency Medicine

## 2021-08-28 DIAGNOSIS — R0981 Nasal congestion: Secondary | ICD-10-CM | POA: Insufficient documentation

## 2021-08-28 DIAGNOSIS — F17210 Nicotine dependence, cigarettes, uncomplicated: Secondary | ICD-10-CM | POA: Insufficient documentation

## 2021-08-28 DIAGNOSIS — R197 Diarrhea, unspecified: Secondary | ICD-10-CM | POA: Insufficient documentation

## 2021-08-28 DIAGNOSIS — J45909 Unspecified asthma, uncomplicated: Secondary | ICD-10-CM | POA: Insufficient documentation

## 2021-08-28 DIAGNOSIS — R1032 Left lower quadrant pain: Secondary | ICD-10-CM | POA: Insufficient documentation

## 2021-08-28 DIAGNOSIS — Z20822 Contact with and (suspected) exposure to covid-19: Secondary | ICD-10-CM | POA: Insufficient documentation

## 2021-08-28 DIAGNOSIS — R059 Cough, unspecified: Secondary | ICD-10-CM | POA: Insufficient documentation

## 2021-08-28 DIAGNOSIS — E039 Hypothyroidism, unspecified: Secondary | ICD-10-CM | POA: Insufficient documentation

## 2021-08-28 DIAGNOSIS — R0600 Dyspnea, unspecified: Secondary | ICD-10-CM | POA: Insufficient documentation

## 2021-08-28 DIAGNOSIS — I10 Essential (primary) hypertension: Secondary | ICD-10-CM | POA: Insufficient documentation

## 2021-08-28 DIAGNOSIS — Z79899 Other long term (current) drug therapy: Secondary | ICD-10-CM | POA: Insufficient documentation

## 2021-08-28 DIAGNOSIS — K5720 Diverticulitis of large intestine with perforation and abscess without bleeding: Secondary | ICD-10-CM

## 2021-08-28 LAB — URINALYSIS, ROUTINE W REFLEX MICROSCOPIC
Bilirubin Urine: NEGATIVE
Glucose, UA: NEGATIVE mg/dL
Hgb urine dipstick: NEGATIVE
Ketones, ur: NEGATIVE mg/dL
Leukocytes,Ua: NEGATIVE
Nitrite: NEGATIVE
Specific Gravity, Urine: 1.031 — ABNORMAL HIGH (ref 1.005–1.030)
pH: 6.5 (ref 5.0–8.0)

## 2021-08-28 LAB — RESP PANEL BY RT-PCR (FLU A&B, COVID) ARPGX2
Influenza A by PCR: NEGATIVE
Influenza B by PCR: NEGATIVE
SARS Coronavirus 2 by RT PCR: NEGATIVE

## 2021-08-28 LAB — CBC WITH DIFFERENTIAL/PLATELET
Abs Immature Granulocytes: 0.03 10*3/uL (ref 0.00–0.07)
Basophils Absolute: 0 10*3/uL (ref 0.0–0.1)
Basophils Relative: 1 %
Eosinophils Absolute: 0.2 10*3/uL (ref 0.0–0.5)
Eosinophils Relative: 2 %
HCT: 44.6 % (ref 36.0–46.0)
Hemoglobin: 14.4 g/dL (ref 12.0–15.0)
Immature Granulocytes: 0 %
Lymphocytes Relative: 34 %
Lymphs Abs: 2.5 10*3/uL (ref 0.7–4.0)
MCH: 31.3 pg (ref 26.0–34.0)
MCHC: 32.3 g/dL (ref 30.0–36.0)
MCV: 97 fL (ref 80.0–100.0)
Monocytes Absolute: 0.5 10*3/uL (ref 0.1–1.0)
Monocytes Relative: 7 %
Neutro Abs: 4 10*3/uL (ref 1.7–7.7)
Neutrophils Relative %: 56 %
Platelets: 192 10*3/uL (ref 150–400)
RBC: 4.6 MIL/uL (ref 3.87–5.11)
RDW: 12.6 % (ref 11.5–15.5)
WBC: 7.2 10*3/uL (ref 4.0–10.5)
nRBC: 0 % (ref 0.0–0.2)

## 2021-08-28 LAB — BASIC METABOLIC PANEL
Anion gap: 7 (ref 5–15)
BUN: 11 mg/dL (ref 6–20)
CO2: 27 mmol/L (ref 22–32)
Calcium: 9.1 mg/dL (ref 8.9–10.3)
Chloride: 105 mmol/L (ref 98–111)
Creatinine, Ser: 0.78 mg/dL (ref 0.44–1.00)
GFR, Estimated: >60 mL/min (ref >60)
Glucose, Bld: 123 mg/dL — ABNORMAL HIGH (ref 70–99)
Potassium: 3.6 mmol/L (ref 3.5–5.1)
Sodium: 139 mmol/L (ref 135–145)

## 2021-08-28 MED ORDER — IOHEXOL 350 MG/ML SOLN
100.0000 mL | Freq: Once | INTRAVENOUS | Status: AC | PRN
  Administered 2021-08-28: 100 mL via INTRAVENOUS

## 2021-08-28 MED ORDER — AMOXICILLIN-POT CLAVULANATE 875-125 MG PO TABS: 1.0000 | ORAL_TABLET | Freq: Two times a day (BID) | ORAL | 0 refills | Status: DC

## 2021-08-28 MED ORDER — ALBUTEROL SULFATE HFA 108 (90 BASE) MCG/ACT IN AERS
2.0000 | INHALATION_SPRAY | RESPIRATORY_TRACT | Status: DC | PRN
  Filled 2021-08-28: qty 6.7

## 2021-08-28 MED ORDER — ALBUTEROL (5 MG/ML) CONTINUOUS INHALATION SOLN
10.0000 mg/h | INHALATION_SOLUTION | Freq: Once | RESPIRATORY_TRACT | Status: AC
  Administered 2021-08-28: 10 mg/h via RESPIRATORY_TRACT
  Filled 2021-08-28: qty 20

## 2021-08-28 MED ORDER — IPRATROPIUM BROMIDE 0.02 % IN SOLN
0.5000 mg | Freq: Once | RESPIRATORY_TRACT | Status: AC
  Administered 2021-08-28: 0.5 mg via RESPIRATORY_TRACT
  Filled 2021-08-28: qty 2.5

## 2021-08-28 NOTE — Discharge Instructions (Signed)
Call your primary care doctor or specialist as discussed in the next 2-3 days.   Return immediately back to the ER if:  Your symptoms worsen within the next 12-24 hours. You develop new symptoms such as new fevers, persistent vomiting, new pain, shortness of breath, or new weakness or numbness, or if you have any other concerns.  

## 2021-08-28 NOTE — ED Notes (Signed)
Patient transported to X-ray 

## 2021-08-28 NOTE — ED Notes (Signed)
Patient verbalizes understanding of discharge instructions. Opportunity for questioning and answers were provided. Patient discharged from ED.  °

## 2021-08-28 NOTE — ED Provider Notes (Signed)
Patient signed out as pending CT imaging.  CT imaging shows recurrence of her diverticulitis with abscess that appears smaller than prior imaging.  Patient otherwise without fevers and normal white count here today.  Case discussed with on-call surgery Dr. Derrell Lolling, okay with continued outpatient follow-up.  Patient advised outpatient follow-up with surgery within the week.  Advising immediate return for worsening pain fevers vomiting or diarrhea.  Patient was understanding.  However she states that she does not want to stay and prefers to follow on outpatient basis.  Will return if symptoms worsen.     Cheryll Cockayne, MD 08/28/21 [phone]

## 2021-08-28 NOTE — ED Notes (Signed)
RT note: Pt. back from CT and states,"breathing much better", has completed continuous aerosol tx.

## 2021-08-28 NOTE — ED Provider Notes (Signed)
[facility] EMERGENCY DEPT Provider Note   CSN: [id number] Arrival date & time: 08/28/21  1331     History Chief Complaint  Patient presents with  . Shortness of Breath    Lori Mcknight is a 35 y.o. female.  35 year old female presents with 5 days of cough and congestion.  History of asthma and has been using inhaler without relief.  Some exertional dyspnea with associated wheezing.  States she has had issues since having COVID recently.  Denies any fever or chills.  Also has secondary complaint of having left lower quadrant abdominal pain similar to her diverticulitis which she has had several years ago.  Has had some diarrhea but denies any blood in the stool.  States her pain is severe in her left lower quadrant and not associated with urinary symptoms.      Past Medical History:  Diagnosis Date  . Anemia    history of anemia  . Anxiety   . Asthma    inhaler as needed  . Chest pain of uncertain etiology    intermittent left side chest pain  . Chlamydia   . Depression   . Diverticulitis 2019  . Endometriosis   . Genital herpes   . Headache    MIGRAINES  . Hypertension    no meds since April  . Hypothyroidism   . Obese   . Obesity, Class III, BMI 40-49.9 (morbid obesity) (HCC) 11/06/2011  . PCOS (polycystic ovarian syndrome)   . PONV (postoperative nausea and vomiting)   . Shortness of breath    on exertion from weight    Patient Active Problem List   Diagnosis Date Noted  . Generalized abdominal pain 05/09/2021  . Diverticulitis of large intestine with abscess 05/09/2021  . Hypokalemia 05/09/2021  . Asthma, mild intermittent 05/09/2021  . Diverticulitis 03/26/2020  . Obesity, morbid, BMI 50 or higher (HCC) 08/08/2019  . Endometriosis s/p ablation 08/08/2019  . Dysuria 08/08/2019  . Family history of colon cancer in father 08/08/2019  . Acute diverticulitis 08/07/2019  . Incomplete bladder emptying 02/18/2018  . DUB (dysfunctional uterine  bleeding) 02/18/2018  . Anemia 02/18/2018  . Hypothyroid 01/29/2017  . Migraines 08/26/2016  . Chest pain 02/04/2016  . Abnormal uterine bleeding (AUB) 01/09/2014  . Thickened endometrium 12/02/2013  . Essential hypertension 10/21/2013  . PCOS (polycystic ovarian syndrome) 11/06/2011  . Infertility, female 11/06/2011  . Pelvic pain in female 08/01/2011    Past Surgical History:  Procedure Laterality Date  . DILATION AND CURETTAGE OF UTERUS N/A 04/29/2018   Procedure: DILATATION AND CURETTAGE;  Surgeon: Allie Bossier, MD;  Location: [facility] ORS;  Service: Gynecology;  Laterality: N/A;  . HYSTEROSCOPY WITH D & C N/A 04/06/2014   Procedure: DILATATION AND CURETTAGE /HYSTEROSCOPY ;  Surgeon: Tereso Newcomer, MD;  Location: [facility];  Service: Gynecology;  Laterality: N/A;  . LAPAROSCOPY N/A 04/29/2018   Procedure: LAPAROSCOPY DIAGNOSTIC WITH PERITONEAL BIOPSY;  Surgeon: Allie Bossier, MD;  Location: [facility];  Service: Gynecology;  Laterality: N/A;  . LEFT HEART CATH AND CORONARY ANGIOGRAPHY N/A 12/12/2016   Procedure: Left Heart Cath and Coronary Angiography;  Surgeon: Peter M Swaziland, MD;  Location: [facility] INVASIVE CV LAB;  Service: Cardiovascular;  Laterality: N/A;  . WISDOM TOOTH EXTRACTION       OB History     Gravida  0   Para  0   Term  0   Preterm  0   AB  0   Living  0      SAB  0   IAB  0   Ectopic  0   Multiple  0   Live Births  0           Family History  Problem Relation Age of Onset  . Heart disease Mother   . Sleep apnea Mother   . Depression Mother   . Hypertension Mother   . Kidney disease Mother   . Cancer Father   . Diabetes Father   . Heart disease Father   . Colon cancer Father        2s  . Hypertension Father   . Diabetes Sister   . Hypertension Sister   . Heart disease Maternal Grandmother   . Diabetes Maternal Grandfather   . Heart disease Maternal Grandfather   . Colon cancer Cousin        died age 3, paternal cousin    Social History    Tobacco Use  . Smoking status: Some Days    Packs/day: 0.25    Types: Cigarettes  . Smokeless tobacco: Never  . Tobacco comments:    2-3 cigerettes a week.  Vaping Use  . Vaping Use: Never used  Substance Use Topics  . Alcohol use: Not Currently    Alcohol/week: 0.0 standard drinks    Comment: occasonal   . Drug use: Not Currently    Types: Marijuana    Comment: last use 3 months ago    Home Medications Prior to Admission medications   Medication Sig Start Date End Date Taking? Authorizing Provider  acetaminophen (TYLENOL) 325 MG tablet Take 2 tablets (650 mg total) by mouth every 6 (six) hours as needed for mild pain or fever. 05/22/21   Claiborne Rigg, NP  albuterol (VENTOLIN HFA) 108 (90 Base) MCG/ACT inhaler Inhale 2 puffs into the lungs every 6 (six) hours as needed for wheezing or shortness of breath. 05/22/21   Claiborne Rigg, NP  cyclobenzaprine (FLEXERIL) 10 MG tablet Take 1 tablet (10 mg total) by mouth at bedtime. Patient not taking: Reported on 07/14/2021 05/06/21   Claiborne Rigg, NP  docusate sodium (COLACE) 100 MG capsule Take 1 capsule (100 mg total) by mouth 2 (two) times daily as needed. 07/17/21   Eric Form, PA-C  gabapentin (NEURONTIN) 300 MG capsule Take 1 capsule (300 mg total) by mouth 2 (two) times daily. Patient not taking: Reported on 07/14/2021 08/28/20   Rema Fendt, NP  ibuprofen (ADVIL) 200 MG tablet Take 3 tablets (600 mg total) by mouth every 6 (six) hours as needed for moderate pain. 07/17/21   Eric Form, PA-C  levothyroxine (EUTHYROX) 88 MCG tablet Take 88 mcg by mouth daily before breakfast.    [provider]  levothyroxine (SYNTHROID) 88 MCG tablet Take 1 tablet (88 mcg total) by mouth daily before breakfast. 05/15/21 06/14/21  Lanae Boast, MD  medroxyPROGESTERone (PROVERA) 10 MG tablet Take 1 tablet (10 mg total) by mouth daily. 08/15/21   Jerene Bears, MD  polyethylene glycol (MIRALAX / GLYCOLAX) 17 g packet Take 17 g  by mouth 2 (two) times daily as needed for moderate constipation or severe constipation. 07/17/21   Eric Form, PA-C    Allergies    Dilaudid [hydromorphone]  Review of Systems   Review of Systems  All other systems reviewed and are negative.  Physical Exam Updated Vital Signs BP 127/79   Pulse 98   Temp 98.2 F (36.8 C)  Resp 20   Ht 1.676 m (5\' 6" )   SpO2 100%   BMI 61.81 kg/m   Physical Exam Vitals and nursing note reviewed.  Constitutional:      General: She is not in acute distress.    Appearance: Normal appearance. She is well-developed. She is not toxic-appearing.  HENT:     Head: Normocephalic and atraumatic.  Eyes:     General: Lids are normal.     Conjunctiva/sclera: Conjunctivae normal.     Pupils: Pupils are equal, round, and reactive to light.  Neck:     Thyroid: No thyroid mass.     Trachea: No tracheal deviation.  Cardiovascular:     Rate and Rhythm: Normal rate and regular rhythm.     Heart sounds: Normal heart sounds. No murmur heard.   No gallop.  Pulmonary:     Effort: Pulmonary effort is normal. No respiratory distress.     Breath sounds: Decreased air movement present. Decreased breath sounds and wheezing present. No rhonchi or rales.  Abdominal:     General: There is no distension.     Palpations: Abdomen is soft.     Tenderness: There is abdominal tenderness in the left lower quadrant. There is no rebound.    Musculoskeletal:        General: No tenderness. Normal range of motion.     Cervical back: Normal range of motion and neck supple.  Skin:    General: Skin is warm and dry.     Findings: No abrasion or rash.  Neurological:     Mental Status: She is alert and oriented to person, place, and time. Mental status is at baseline.     GCS: GCS eye subscore is 4. GCS verbal subscore is 5. GCS motor subscore is 6.     Cranial Nerves: No cranial nerve deficit.     Sensory: No sensory deficit.     Motor: Motor function is intact.   Psychiatric:        Attention and Perception: Attention normal.        Speech: Speech normal.        Behavior: Behavior normal.    ED Results / Procedures / Treatments   Labs (all labs ordered are listed, but only abnormal results are displayed) Labs Reviewed  RESP PANEL BY RT-PCR (FLU A&B, COVID) [id number]  CBC WITH DIFFERENTIAL/PLATELET  BASIC METABOLIC PANEL  URINALYSIS, ROUTINE W REFLEX MICROSCOPIC    EKG EKG Interpretation  Date/Time:  Wednesday August 28 2021 13:41:10 EST Ventricular Rate:  93 PR Interval:  144 QRS Duration: 80 QT Interval:  320 QTC Calculation: 397 R Axis:   69 Text Interpretation: Normal sinus rhythm T wave abnormality, consider inferior ischemia Abnormal ECG No significant change since last tracing Confirmed by 06-07-2005 (Lorre Nick) on 08/28/2021 1:59:44 PM  Radiology DG Chest 2 View  Result Date: 08/28/2021 CLINICAL DATA:  Shortness of breath EXAM: CHEST - 2 VIEW COMPARISON:  Chest radiograph 01/06/2017 FINDINGS: The cardiomediastinal silhouette is within normal limits. There is no focal consolidation or pulmonary edema. There is no pleural effusion or pneumothorax. There is no acute osseous abnormality. IMPRESSION: No radiographic evidence of acute cardiopulmonary process. Electronically Signed   By: 01/08/2017 M.D.   On: 08/28/2021 14:13    Procedures Procedures   Medications Ordered in ED Medications  albuterol (VENTOLIN HFA) 108 (90 Base) MCG/ACT inhaler 2 puff (has no administration in time range)  albuterol (PROVENTIL,VENTOLIN) solution continuous neb (10 mg/hr Nebulization Given 08/28/21  1424)  ipratropium (ATROVENT) nebulizer solution 0.5 mg (0.5 mg Nebulization Given 08/28/21 1424)    ED Course  I have reviewed the triage vital signs and the nursing notes.  Pertinent labs & imaging results that were available during my care of the patient were reviewed by me and considered in my medical decision making (see chart for details).     MDM Rules/Calculators/A&P                           Chest x-ray without acute process here.  Moderate wheezing appreciated.  Patient given albuterol and Atrovent.  Patient to receive abdominal CT for evaluation of possible diverticular abscess that she is tender in her left lower quadrant.  We will send   Final Clinical Impression(s) / ED Diagnoses Final diagnoses:  None    Rx / DC Orders ED Discharge Orders     None        Lorre Nick, MD 08/28/21 1439

## 2021-08-28 NOTE — ED Triage Notes (Signed)
Pt presents to ED POV. Pt c/o Cp and SOB x3d. Pt reports that she has had contact w/ flu+ person. CP only when coughing. Pt tachypnea, and audible wheezing in triage.

## 2021-09-11 ENCOUNTER — Encounter (HOSPITAL_BASED_OUTPATIENT_CLINIC_OR_DEPARTMENT_OTHER): Payer: Self-pay | Admitting: *Deleted

## 2021-09-11 ENCOUNTER — Other Ambulatory Visit: Payer: Self-pay

## 2021-09-11 ENCOUNTER — Inpatient Hospital Stay (HOSPITAL_BASED_OUTPATIENT_CLINIC_OR_DEPARTMENT_OTHER)
Admission: EM | Admit: 2021-09-11 | Discharge: 2021-09-24 | DRG: 329 | Disposition: A | Discharge status: Home Health | Payer: Self-pay | Attending: Internal Medicine | Admitting: Internal Medicine

## 2021-09-11 ENCOUNTER — Emergency Department (HOSPITAL_BASED_OUTPATIENT_CLINIC_OR_DEPARTMENT_OTHER): Payer: Self-pay

## 2021-09-11 ENCOUNTER — Other Ambulatory Visit: Payer: Self-pay | Admitting: Urology

## 2021-09-11 DIAGNOSIS — K658 Other peritonitis: Secondary | ICD-10-CM

## 2021-09-11 DIAGNOSIS — D649 Anemia, unspecified: Secondary | ICD-10-CM | POA: Diagnosis present

## 2021-09-11 DIAGNOSIS — Z6841 Body Mass Index (BMI) 40.0 and over, adult: Secondary | ICD-10-CM

## 2021-09-11 DIAGNOSIS — E282 Polycystic ovarian syndrome: Secondary | ICD-10-CM | POA: Diagnosis present

## 2021-09-11 DIAGNOSIS — E876 Hypokalemia: Secondary | ICD-10-CM | POA: Diagnosis present

## 2021-09-11 DIAGNOSIS — F419 Anxiety disorder, unspecified: Secondary | ICD-10-CM | POA: Diagnosis present

## 2021-09-11 DIAGNOSIS — K651 Peritoneal abscess: Secondary | ICD-10-CM

## 2021-09-11 DIAGNOSIS — Z818 Family history of other mental and behavioral disorders: Secondary | ICD-10-CM

## 2021-09-11 DIAGNOSIS — Z833 Family history of diabetes mellitus: Secondary | ICD-10-CM

## 2021-09-11 DIAGNOSIS — Z8249 Family history of ischemic heart disease and other diseases of the circulatory system: Secondary | ICD-10-CM

## 2021-09-11 DIAGNOSIS — F17210 Nicotine dependence, cigarettes, uncomplicated: Secondary | ICD-10-CM | POA: Diagnosis present

## 2021-09-11 DIAGNOSIS — Z79890 Hormone replacement therapy (postmenopausal): Secondary | ICD-10-CM

## 2021-09-11 DIAGNOSIS — K5720 Diverticulitis of large intestine with perforation and abscess without bleeding: Principal | ICD-10-CM | POA: Diagnosis present

## 2021-09-11 DIAGNOSIS — Z20822 Contact with and (suspected) exposure to covid-19: Secondary | ICD-10-CM | POA: Diagnosis present

## 2021-09-11 DIAGNOSIS — F32A Depression, unspecified: Secondary | ICD-10-CM | POA: Diagnosis present

## 2021-09-11 DIAGNOSIS — Z79899 Other long term (current) drug therapy: Secondary | ICD-10-CM

## 2021-09-11 DIAGNOSIS — J4520 Mild intermittent asthma, uncomplicated: Secondary | ICD-10-CM | POA: Diagnosis present

## 2021-09-11 DIAGNOSIS — K5651 Intestinal adhesions [bands], with partial obstruction: Secondary | ICD-10-CM | POA: Diagnosis present

## 2021-09-11 DIAGNOSIS — I10 Essential (primary) hypertension: Secondary | ICD-10-CM | POA: Diagnosis present

## 2021-09-11 DIAGNOSIS — E440 Moderate protein-calorie malnutrition: Secondary | ICD-10-CM | POA: Diagnosis present

## 2021-09-11 DIAGNOSIS — N809 Endometriosis, unspecified: Secondary | ICD-10-CM | POA: Diagnosis present

## 2021-09-11 DIAGNOSIS — E039 Hypothyroidism, unspecified: Secondary | ICD-10-CM | POA: Diagnosis present

## 2021-09-11 DIAGNOSIS — Z885 Allergy status to narcotic agent status: Secondary | ICD-10-CM

## 2021-09-11 DIAGNOSIS — K5780 Diverticulitis of intestine, part unspecified, with perforation and abscess without bleeding: Secondary | ICD-10-CM | POA: Diagnosis present

## 2021-09-11 DIAGNOSIS — Z800 Family history of malignant neoplasm of digestive organs: Secondary | ICD-10-CM

## 2021-09-11 DIAGNOSIS — D72829 Elevated white blood cell count, unspecified: Secondary | ICD-10-CM | POA: Diagnosis not present

## 2021-09-11 DIAGNOSIS — Z841 Family history of disorders of kidney and ureter: Secondary | ICD-10-CM

## 2021-09-11 DIAGNOSIS — Z933 Colostomy status: Secondary | ICD-10-CM

## 2021-09-11 DIAGNOSIS — N736 Female pelvic peritoneal adhesions (postinfective): Secondary | ICD-10-CM | POA: Diagnosis present

## 2021-09-11 DIAGNOSIS — N7093 Salpingitis and oophoritis, unspecified: Secondary | ICD-10-CM | POA: Diagnosis present

## 2021-09-11 DIAGNOSIS — R072 Precordial pain: Secondary | ICD-10-CM | POA: Diagnosis present

## 2021-09-11 LAB — URINALYSIS, ROUTINE W REFLEX MICROSCOPIC
Bilirubin Urine: NEGATIVE
Glucose, UA: NEGATIVE mg/dL
Ketones, ur: NEGATIVE mg/dL
Leukocytes,Ua: NEGATIVE
Nitrite: NEGATIVE
Protein, ur: 30 mg/dL — AB
Specific Gravity, Urine: >1.046 — ABNORMAL HIGH (ref 1.005–1.030)
pH: 6.5 (ref 5.0–8.0)

## 2021-09-11 LAB — CBC WITH DIFFERENTIAL/PLATELET
Abs Immature Granulocytes: 0.01 10*3/uL (ref 0.00–0.07)
Basophils Absolute: 0 10*3/uL (ref 0.0–0.1)
Basophils Relative: 0 %
Eosinophils Absolute: 0.1 10*3/uL (ref 0.0–0.5)
Eosinophils Relative: 1 %
HCT: 45.4 % (ref 36.0–46.0)
Hemoglobin: 14.8 g/dL (ref 12.0–15.0)
Immature Granulocytes: 0 %
Lymphocytes Relative: 33 %
Lymphs Abs: 1.8 10*3/uL (ref 0.7–4.0)
MCH: 31.2 pg (ref 26.0–34.0)
MCHC: 32.6 g/dL (ref 30.0–36.0)
MCV: 95.8 fL (ref 80.0–100.0)
Monocytes Absolute: 0.3 10*3/uL (ref 0.1–1.0)
Monocytes Relative: 5 %
Neutro Abs: 3.3 10*3/uL (ref 1.7–7.7)
Neutrophils Relative %: 61 %
Platelets: 203 10*3/uL (ref 150–400)
RBC: 4.74 MIL/uL (ref 3.87–5.11)
RDW: 12.4 % (ref 11.5–15.5)
WBC: 5.4 10*3/uL (ref 4.0–10.5)
nRBC: 0 % (ref 0.0–0.2)

## 2021-09-11 LAB — COMPREHENSIVE METABOLIC PANEL
ALT: 17 U/L (ref 0–44)
AST: 16 U/L (ref 15–41)
Albumin: 4.1 g/dL (ref 3.5–5.0)
Alkaline Phosphatase: 64 U/L (ref 38–126)
Anion gap: 10 (ref 5–15)
BUN: 9 mg/dL (ref 6–20)
CO2: 24 mmol/L (ref 22–32)
Calcium: 9.5 mg/dL (ref 8.9–10.3)
Chloride: 105 mmol/L (ref 98–111)
Creatinine, Ser: 0.75 mg/dL (ref 0.44–1.00)
GFR, Estimated: >60 mL/min (ref >60)
Glucose, Bld: 113 mg/dL — ABNORMAL HIGH (ref 70–99)
Potassium: 3.5 mmol/L (ref 3.5–5.1)
Sodium: 139 mmol/L (ref 135–145)
Total Bilirubin: 0.7 mg/dL (ref 0.3–1.2)
Total Protein: 7.7 g/dL (ref 6.5–8.1)

## 2021-09-11 LAB — RESP PANEL BY RT-PCR (FLU A&B, COVID) ARPGX2
Influenza A by PCR: NEGATIVE
Influenza B by PCR: NEGATIVE
SARS Coronavirus 2 by RT PCR: NEGATIVE

## 2021-09-11 LAB — LIPASE, BLOOD: Lipase: 42 U/L (ref 11–51)

## 2021-09-11 LAB — HCG, QUANTITATIVE, PREGNANCY: hCG, Beta Chain, Quant, S: <1 m[IU]/mL (ref <5)

## 2021-09-11 MED ORDER — IOHEXOL 300 MG/ML  SOLN
100.0000 mL | Freq: Once | INTRAMUSCULAR | Status: AC | PRN
  Administered 2021-09-11: 100 mL via INTRAVENOUS

## 2021-09-11 MED ORDER — SODIUM CHLORIDE 0.9 % IV SOLN: INTRAVENOUS | Status: DC | PRN

## 2021-09-11 MED ORDER — MORPHINE SULFATE (PF) 4 MG/ML IV SOLN
4.0000 mg | Freq: Once | INTRAVENOUS | Status: AC
  Administered 2021-09-11: 4 mg via INTRAVENOUS
  Filled 2021-09-11: qty 1

## 2021-09-11 MED ORDER — DIPHENHYDRAMINE HCL 50 MG/ML IJ SOLN
25.0000 mg | Freq: Four times a day (QID) | INTRAMUSCULAR | Status: DC | PRN
  Administered 2021-09-14 – 2021-09-17 (×4): 25 mg via INTRAVENOUS
  Filled 2021-09-11 (×4): qty 1

## 2021-09-11 MED ORDER — POLYETHYLENE GLYCOL 3350 17 G PO PACK
17.0000 g | PACK | Freq: Two times a day (BID) | ORAL | Status: DC | PRN
  Administered 2021-09-12: 17 g via ORAL
  Filled 2021-09-11 (×2): qty 1

## 2021-09-11 MED ORDER — ONDANSETRON HCL 4 MG/2ML IJ SOLN
4.0000 mg | Freq: Once | INTRAMUSCULAR | Status: AC
  Administered 2021-09-11: 4 mg via INTRAVENOUS
  Filled 2021-09-11: qty 2

## 2021-09-11 MED ORDER — FENTANYL CITRATE PF 50 MCG/ML IJ SOSY
12.5000 ug | PREFILLED_SYRINGE | INTRAMUSCULAR | Status: DC | PRN
Start: 2021-09-11 — End: 2021-09-13
  Administered 2021-09-11 – 2021-09-13 (×7): 25 ug via INTRAVENOUS
  Filled 2021-09-11 (×7): qty 1

## 2021-09-11 MED ORDER — MEDROXYPROGESTERONE ACETATE 10 MG PO TABS
10.0000 mg | ORAL_TABLET | Freq: Every day | ORAL | Status: DC
  Administered 2021-09-11 – 2021-09-24 (×13): 10 mg via ORAL
  Filled 2021-09-11 (×14): qty 1

## 2021-09-11 MED ORDER — LEVOTHYROXINE SODIUM 88 MCG PO TABS
88.0000 ug | ORAL_TABLET | Freq: Every day | ORAL | Status: DC
  Administered 2021-09-11 – 2021-09-20 (×10): 88 ug via ORAL
  Filled 2021-09-11 (×10): qty 1

## 2021-09-11 MED ORDER — PIPERACILLIN-TAZOBACTAM 3.375 G IVPB 30 MIN
3.3750 g | Freq: Once | INTRAVENOUS | Status: AC
  Administered 2021-09-11: 3.375 g via INTRAVENOUS
  Filled 2021-09-11: qty 50

## 2021-09-11 MED ORDER — FENTANYL CITRATE PF 50 MCG/ML IJ SOSY
100.0000 ug | PREFILLED_SYRINGE | Freq: Once | INTRAMUSCULAR | Status: AC
  Administered 2021-09-11: 100 ug via INTRAVENOUS
  Filled 2021-09-11: qty 2

## 2021-09-11 MED ORDER — DOCUSATE SODIUM 100 MG PO CAPS: 100.0000 mg | ORAL_CAPSULE | Freq: Two times a day (BID) | ORAL | Status: DC | PRN

## 2021-09-11 MED ORDER — PIPERACILLIN-TAZOBACTAM 3.375 G IVPB
3.3750 g | Freq: Three times a day (TID) | INTRAVENOUS | Status: DC
  Administered 2021-09-11 – 2021-09-17 (×18): 3.375 g via INTRAVENOUS
  Filled 2021-09-11 (×21): qty 50

## 2021-09-11 MED ORDER — MORPHINE SULFATE (PF) 2 MG/ML IV SOLN
1.0000 mg | INTRAVENOUS | Status: DC | PRN
Start: 2021-09-11 — End: 2021-09-11
  Administered 2021-09-11: 2 mg via INTRAVENOUS
  Filled 2021-09-11: qty 1

## 2021-09-11 NOTE — Progress Notes (Signed)
Pharmacy Antibiotic Note  Lori Mcknight is a 35 y.o. female admitted on 09/11/2021 with diverticulitis and small associated abscess.  Pharmacy has been consulted for Zosyn dosing.  Plan: Zosyn 3.375g IV Q8H infused over 4hrs. Follow up renal function, culture results, and clinical course.   Height: 5\' 6"  (167.6 cm) Weight: (!) 163.3 kg (360 lb) IBW/kg (Calculated) : 59.3  Temp (24hrs), Avg:98.4 F (36.9 C), Min:98 F (36.7 C), Max:98.8 F (37.1 C)  Recent Labs  Lab 09/11/21 0400  WBC 5.4  CREATININE 0.75    Estimated Creatinine Clearance: 154.9 mL/min (by C-G formula based on SCr of 0.75 mg/dL).    Allergies  Allergen Reactions   Dilaudid [Hydromorphone] Rash    Itching all over body    Antimicrobials this admission: 11/30 Zosyn >>   Dose adjustments this admission:   Microbiology results: 11/30 BCx:   Thank you for allowing pharmacy to be a part of this patient's care.  12/30 PharmD, BCPS Clinical Pharmacist [facility] main pharmacy [phone] 09/11/2021 3:39 PM

## 2021-09-11 NOTE — Consult Note (Signed)
CC: Diveritculitis  Requesting provider: Ross Marcus PAC  HPI: Lori Mcknight is an 35 y.o. female with hx of morbid obesity, asthma, HTN, diverticulitis well known to surgical service. She recently finished course of Augmentin from a small flare 11/16. She returned to ER with similar symptoms this go round. Associated lower crampy pains in suprapubic region 2 days ago. No fever/chills. She has been following in our office and is currently scheduled for surgery 11/04/21 with one of my partners Dr. Michaell Cowing.  Prior admission 07/2021 for similar.  Past Medical History:  Diagnosis Date   Anemia    history of anemia   Anxiety    Asthma    inhaler as needed   Chest pain of uncertain etiology    intermittent left side chest pain   Chlamydia    Depression    Diverticulitis 2019   Endometriosis    Genital herpes    Headache    MIGRAINES   Hypertension    no meds since April   Hypothyroidism    Obese    Obesity, Class III, BMI 40-49.9 (morbid obesity) (HCC) 11/06/2011   PCOS (polycystic ovarian syndrome)    PONV (postoperative nausea and vomiting)    Shortness of breath    on exertion from weight    Past Surgical History:  Procedure Laterality Date   DILATION AND CURETTAGE OF UTERUS N/A 04/29/2018   Procedure: DILATATION AND CURETTAGE;  Surgeon: Allie Bossier, MD;  Location: [facility];  Service: Gynecology;  Laterality: N/A;   HYSTEROSCOPY WITH D & C N/A 04/06/2014   Procedure: DILATATION AND CURETTAGE /HYSTEROSCOPY ;  Surgeon: Tereso Newcomer, MD;  Location: [facility];  Service: Gynecology;  Laterality: N/A;   LAPAROSCOPY N/A 04/29/2018   Procedure: LAPAROSCOPY DIAGNOSTIC WITH PERITONEAL BIOPSY;  Surgeon: Allie Bossier, MD;  Location: [facility];  Service: Gynecology;  Laterality: N/A;   LEFT HEART CATH AND CORONARY ANGIOGRAPHY N/A 12/12/2016   Procedure: Left Heart Cath and Coronary Angiography;  Surgeon: Peter M Swaziland, MD;  Location: [facility] INVASIVE CV LAB;  Service: Cardiovascular;   Laterality: N/A;   WISDOM TOOTH EXTRACTION      Family History  Problem Relation Age of Onset   Heart disease Mother    Sleep apnea Mother    Depression Mother    Hypertension Mother    Kidney disease Mother    Cancer Father    Diabetes Father    Heart disease Father    Colon cancer Father        53s   Hypertension Father    Diabetes Sister    Hypertension Sister    Heart disease Maternal Grandmother    Diabetes Maternal Grandfather    Heart disease Maternal Grandfather    Colon cancer Cousin        died age 38, paternal cousin    Social:  reports that she has been smoking cigarettes. She has been smoking an average of .25 packs per day. She has never used smokeless tobacco. She reports that she does not currently use alcohol. She reports that she does not currently use drugs after having used the following drugs: Marijuana.  Allergies:  Allergies  Allergen Reactions   Dilaudid [Hydromorphone] Rash    Itching all over body    Medications: I have reviewed the patient's current medications.  Results for orders placed or performed during the hospital encounter of 09/11/21 (from the past 48 hour(s))  Urinalysis, Routine w reflex microscopic Urine, Clean Catch  Status: Abnormal   Collection Time: 09/11/21  4:00 AM  Result Value Ref Range   Color, Urine YELLOW YELLOW   APPearance CLEAR CLEAR   Specific Gravity, Urine >1.046 (H) 1.005 - 1.030   pH 6.5 5.0 - 8.0   Glucose, UA NEGATIVE NEGATIVE mg/dL   Hgb urine dipstick MODERATE (A) NEGATIVE   Bilirubin Urine NEGATIVE NEGATIVE   Ketones, ur NEGATIVE NEGATIVE mg/dL   Protein, ur 30 (A) NEGATIVE mg/dL   Nitrite NEGATIVE NEGATIVE   Leukocytes,Ua NEGATIVE NEGATIVE   RBC / HPF 0-5 0 - 5 RBC/hpf   WBC, UA 0-5 0 - 5 WBC/hpf   Squamous Epithelial / LPF 0-5 0 - 5   Mucus PRESENT     Comment: Performed at [facility], [address], [place], [place] [address]  CBC with Differential     Status: None    Collection Time: 09/11/21  4:00 AM  Result Value Ref Range   WBC 5.4 4.0 - 10.5 K/uL   RBC 4.74 3.87 - 5.11 MIL/uL   Hemoglobin 14.8 12.0 - 15.0 g/dL   HCT 45.4 36.0 - 46.0 %   MCV 95.8 80.0 - 100.0 fL   MCH 31.2 26.0 - 34.0 pg   MCHC 32.6 30.0 - 36.0 g/dL   RDW 12.4 11.5 - 15.5 %   Platelets 203 150 - 400 K/uL   nRBC 0.0 0.0 - 0.2 %   Neutrophils Relative % 61 %   Neutro Abs 3.3 1.7 - 7.7 K/uL   Lymphocytes Relative 33 %   Lymphs Abs 1.8 0.7 - 4.0 K/uL   Monocytes Relative 5 %   Monocytes Absolute 0.3 0.1 - 1.0 K/uL   Eosinophils Relative 1 %   Eosinophils Absolute 0.1 0.0 - 0.5 K/uL   Basophils Relative 0 %   Basophils Absolute 0.0 0.0 - 0.1 K/uL   Immature Granulocytes 0 %   Abs Immature Granulocytes 0.01 0.00 - 0.07 K/uL    Comment: Performed at [facility], [place], [place] [address]  Comprehensive metabolic panel     Status: Abnormal   Collection Time: 09/11/21  4:00 AM  Result Value Ref Range   Sodium 139 135 - 145 mmol/L   Potassium 3.5 3.5 - 5.1 mmol/L   Chloride 105 98 - 111 mmol/L   CO2 24 22 - 32 mmol/L   Glucose, Bld 113 (H) 70 - 99 mg/dL    Comment: Glucose reference range applies only to samples taken after fasting for at least 8 hours.   BUN 9 6 - 20 mg/dL   Creatinine, Ser 0.75 0.44 - 1.00 mg/dL   Calcium 9.5 8.9 - 10.3 mg/dL   Total Protein 7.7 6.5 - 8.1 g/dL   Albumin 4.1 3.5 - 5.0 g/dL   AST 16 15 - 41 U/L   ALT 17 0 - 44 U/L   Alkaline Phosphatase 64 38 - 126 U/L   Total Bilirubin 0.7 0.3 - 1.2 mg/dL   GFR, Estimated >60 >60 mL/min    Comment: (NOTE) Calculated using the CKD-EPI Creatinine Equation (2021)    Anion gap 10 5 - 15    Comment: Performed at [facility], [place], [place] [address]  Lipase, blood     Status: None   Collection Time: 09/11/21  4:00 AM  Result Value Ref Range   Lipase 42 11 - 51 U/L    Comment: Performed at [facility], [address], [place],  [place] [address]  hCG, quantitative, pregnancy     Status: None   Collection Time: 09/11/21  4:02 AM  Result Value Ref Range   hCG, Beta Chain, Quant, S <1 <5 mIU/mL    Comment:          GEST. AGE      CONC.  (mIU/mL)   <=1 WEEK        5 - 50     2 WEEKS       50 - 500     3 WEEKS       100 - 10,000     4 WEEKS     1,000 - 30,000     5 WEEKS     3,500 - 115,000   6-8 WEEKS     12,000 - 270,000    12 WEEKS     15,000 - 220,000        FEMALE AND NON-PREGNANT FEMALE:     LESS THAN 5 mIU/mL Performed at [facility], [address], [place], [place] [address]   Resp Panel by RT-PCR (Flu A&B, Covid) Nasopharyngeal Swab     Status: None   Collection Time: 09/11/21 10:45 AM   Specimen: Nasopharyngeal Swab; Nasopharyngeal(NP) swabs in vial transport medium  Result Value Ref Range   SARS Coronavirus 2 by RT PCR NEGATIVE NEGATIVE    Comment: (NOTE) SARS-CoV-2 target nucleic acids are NOT DETECTED.  The SARS-CoV-2 RNA is generally detectable in upper respiratory specimens during the acute phase of infection. The lowest concentration of SARS-CoV-2 viral copies this assay can detect is 138 copies/mL. A negative result does not preclude SARS-Cov-2 infection and should not be used as the sole basis for treatment or other patient management decisions. A negative result may occur with  improper specimen collection/handling, submission of specimen other than nasopharyngeal swab, presence of viral mutation(s) within the areas targeted by this assay, and inadequate number of viral copies(<138 copies/mL). A negative result must be combined with clinical observations, patient history, and epidemiological information. The expected result is Negative.  Fact Sheet for Patients:  EntrepreneurPulse.com.au  Fact Sheet for Healthcare Providers:  IncredibleEmployment.be  This test is no t yet approved or cleared by the  [place] FDA and  has been authorized for detection and/or diagnosis of SARS-CoV-2 by FDA under an Emergency Use Authorization (EUA). This EUA will remain  in effect (meaning this test can be used) for the duration of the COVID-19 declaration under Section 564(b)(1) of the Act, 21 U.S.C.section 360bbb-3(b)(1), unless the authorization is terminated  or revoked sooner.       Influenza A by PCR NEGATIVE NEGATIVE   Influenza B by PCR NEGATIVE NEGATIVE    Comment: (NOTE) The Xpert Xpress SARS-CoV-2/FLU/RSV plus assay is intended as an aid in the diagnosis of influenza from Nasopharyngeal swab specimens and should not be used as a sole basis for treatment. Nasal washings and aspirates are unacceptable for Xpert Xpress SARS-CoV-2/FLU/RSV testing.  Fact Sheet for Patients: EntrepreneurPulse.com.au  Fact Sheet for Healthcare Providers: IncredibleEmployment.be  This test is not yet approved or cleared by the [place] FDA and has been authorized for detection and/or diagnosis of SARS-CoV-2 by FDA under an Emergency Use Authorization (EUA). This EUA will remain in effect (meaning this test can be used) for the duration of the COVID-19 declaration under Section 564(b)(1) of the Act, 21 U.S.C. section 360bbb-3(b)(1), unless the authorization is terminated or revoked.  Performed at [facility], [address] [place],  [place] [address]     CT ABDOMEN PELVIS W CONTRAST  Result Date: 09/11/2021 CLINICAL DATA:  Left lower quadrant abdominal pain. EXAM: CT ABDOMEN AND PELVIS WITH CONTRAST TECHNIQUE: Multidetector CT imaging of the abdomen and pelvis was performed using the standard protocol following bolus administration of intravenous contrast. CONTRAST:  170mL OMNIPAQUE IOHEXOL 300 MG/ML  SOLN COMPARISON:  08/28/2021 FINDINGS: Lower chest: Unremarkable Hepatobiliary: No suspicious focal abnormality within the liver  parenchyma. There is no evidence for gallstones, gallbladder wall thickening, or pericholecystic fluid. No intrahepatic or extrahepatic biliary dilation. Pancreas: No focal mass lesion. No dilatation of the main duct. No intraparenchymal cyst. No peripancreatic edema. Spleen: No splenomegaly. No focal mass lesion. Adrenals/Urinary Tract: No adrenal nodule or mass. Kidneys unremarkable. No evidence for hydroureter. As noted previously, there is focal bladder wall thickening towards the anterior dome, adjacent to the sigmoid colon. Stomach/Bowel: Stomach is unremarkable. No gastric wall thickening. No evidence of outlet obstruction. Duodenum is normally positioned as is the ligament of Treitz. No small bowel wall thickening. No small bowel dilatation. The terminal ileum is normal. Wall thickening and pericolonic edema/inflammation associated with the mid sigmoid colon is progressive since 08/28/2021. Extraluminal gas is now seen in the pericolonic fat cranial to the sigmoid colon (coronal image 84/series 5). Extraluminal gas dissects from the inferior wall the sigmoid colon into the wall of the bladder dome where there is a 2.7 x 1.7 cm collection of gas and fluid in focal area of marked bladder wall thickening (well demonstrated coronal image 78/5 and sagittal image 79 of series 6). Vascular/Lymphatic: No abdominal aortic aneurysm mild left para-aortic lymphadenopathy (image 40/2) is progressive in the interval with mild lymphadenopathy in the left common iliac chain. Reproductive: Unremarkable. Other: Small volume intraperitoneal free fluid. Musculoskeletal: No worrisome lytic or sclerotic osseous abnormality. Degenerative changes noted L5-S1 disc. IMPRESSION: 1. Interval progression of wall thickening and pericolonic edema/inflammation associated with the mid sigmoid colon since 08/28/2021. New extraluminal gas is now seen in the pericolonic fat consistent with micro perforation. There is also evidence for  dissection of inflammatory process from the inferior wall of the sigmoid colon into the bladder dome where there is a 2.7 x 1.7 cm intramural collection of gas and fluid suggesting bladder wall abscess. 2. Interval development of mild left para-aortic and left common iliac chain lymphadenopathy, likely reactive. 3. Small volume intraperitoneal free fluid. Electronically Signed   By: Misty Stanley M.D.   On: 09/11/2021 06:09    ROS - all of the below systems have been reviewed with the patient and positives are indicated with bold text General: chills, fever or night sweats Eyes: blurry vision or double vision ENT: epistaxis or sore throat Allergy/Immunology: itchy/watery eyes or nasal congestion Hematologic/Lymphatic: bleeding problems, blood clots or swollen lymph nodes Endocrine: temperature intolerance or unexpected weight changes Breast: new or changing breast lumps or nipple discharge Resp: cough, shortness of breath, or wheezing CV: chest pain or dyspnea on exertion GI: as per HPI GU: dysuria, trouble voiding, or hematuria MSK: joint pain or joint stiffness Neuro: TIA or stroke symptoms Derm: pruritus and skin lesion changes Psych: anxiety and depression  PE Blood pressure 136/90, pulse 88, temperature 98 F (36.7 C), temperature source Oral, resp. rate 18, height 5\' 6"  (1.676 m), weight (!) 163.3 kg, SpO2 95 %. Constitutional: NAD; conversant; no deformities Eyes: Moist conjunctiva; no lid lag; anicteric; PERRL Neck: Trachea midline; no thyromegaly Lungs: Normal respiratory effort; no tactile fremitus CV: RRR; no palpable thrills; no  pitting edema GI: Abd obese, soft, mildly ttp in lower abd/suprapubic region; no palpable hepatosplenomegaly MSK: Normal range of motion of extremities; no clubbing/cyanosis Psychiatric: Appropriate affect; alert and oriented x3 Lymphatic: No palpable cervical or axillary lymphadenopathy  Results for orders placed or performed during the hospital  encounter of 09/11/21 (from the past 48 hour(s))  Urinalysis, Routine w reflex microscopic Urine, Clean Catch     Status: Abnormal   Collection Time: 09/11/21  4:00 AM  Result Value Ref Range   Color, Urine YELLOW YELLOW   APPearance CLEAR CLEAR   Specific Gravity, Urine >1.046 (H) 1.005 - 1.030   pH 6.5 5.0 - 8.0   Glucose, UA NEGATIVE NEGATIVE mg/dL   Hgb urine dipstick MODERATE (A) NEGATIVE   Bilirubin Urine NEGATIVE NEGATIVE   Ketones, ur NEGATIVE NEGATIVE mg/dL   Protein, ur 30 (A) NEGATIVE mg/dL   Nitrite NEGATIVE NEGATIVE   Leukocytes,Ua NEGATIVE NEGATIVE   RBC / HPF 0-5 0 - 5 RBC/hpf   WBC, UA 0-5 0 - 5 WBC/hpf   Squamous Epithelial / LPF 0-5 0 - 5   Mucus PRESENT     Comment: Performed at [facility], [address] [place], [place], [place] [address]  CBC with Differential     Status: None   Collection Time: 09/11/21  4:00 AM  Result Value Ref Range   WBC 5.4 4.0 - 10.5 K/uL   RBC 4.74 3.87 - 5.11 MIL/uL   Hemoglobin 14.8 12.0 - 15.0 g/dL   HCT 17.0 01.7 - 49.4 %   MCV 95.8 80.0 - 100.0 fL   MCH 31.2 26.0 - 34.0 pg   MCHC 32.6 30.0 - 36.0 g/dL   RDW 49.6 75.9 - 16.3 %   Platelets 203 150 - 400 K/uL   nRBC 0.0 0.0 - 0.2 %   Neutrophils Relative % 61 %   Neutro Abs 3.3 1.7 - 7.7 K/uL   Lymphocytes Relative 33 %   Lymphs Abs 1.8 0.7 - 4.0 K/uL   Monocytes Relative 5 %   Monocytes Absolute 0.3 0.1 - 1.0 K/uL   Eosinophils Relative 1 %   Eosinophils Absolute 0.1 0.0 - 0.5 K/uL   Basophils Relative 0 %   Basophils Absolute 0.0 0.0 - 0.1 K/uL   Immature Granulocytes 0 %   Abs Immature Granulocytes 0.01 0.00 - 0.07 K/uL    Comment: Performed at [facility], [address] [place], [place], [place] [address]  Comprehensive metabolic panel     Status: Abnormal   Collection Time: 09/11/21  4:00 AM  Result Value Ref Range   Sodium 139 135 - 145 mmol/L   Potassium 3.5 3.5 - 5.1 mmol/L   Chloride 105 98 - 111 mmol/L   CO2 24 22 - 32 mmol/L    Glucose, Bld 113 (H) 70 - 99 mg/dL    Comment: Glucose reference range applies only to samples taken after fasting for at least 8 hours.   BUN 9 6 - 20 mg/dL   Creatinine, Ser 9.93 0.44 - 1.00 mg/dL   Calcium 9.5 8.9 - 57.0 mg/dL   Total Protein 7.7 6.5 - 8.1 g/dL   Albumin 4.1 3.5 - 5.0 g/dL   AST 16 15 - 41 U/L   ALT 17 0 - 44 U/L   Alkaline Phosphatase 64 38 - 126 U/L   Total Bilirubin 0.7 0.3 - 1.2 mg/dL   GFR, Estimated >17 >79 mL/min    Comment: (NOTE) Calculated using the CKD-EPI Creatinine Equation (2021)  Anion gap 10 5 - 15    Comment: Performed at [facility], [place], [place] [address]  Lipase, blood     Status: None   Collection Time: 09/11/21  4:00 AM  Result Value Ref Range   Lipase 42 11 - 51 U/L    Comment: Performed at [facility], [address] [place], [place] [address]  hCG, quantitative, pregnancy     Status: None   Collection Time: 09/11/21  4:02 AM  Result Value Ref Range   hCG, Beta Chain, Quant, S <1 <5 mIU/mL    Comment:          GEST. AGE      CONC.  (mIU/mL)   <=1 WEEK        5 - 50     2 WEEKS       50 - 500     3 WEEKS       100 - 10,000     4 WEEKS     1,000 - 30,000     5 WEEKS     3,500 - 115,000   6-8 WEEKS     12,000 - 270,000    12 WEEKS     15,000 - 220,000        FEMALE AND NON-PREGNANT FEMALE:     LESS THAN 5 mIU/mL Performed at [facility], [address], [place], [place] [address]   Resp Panel by RT-PCR (Flu A&B, Covid) Nasopharyngeal Swab     Status: None   Collection Time: 09/11/21 10:45 AM   Specimen: Nasopharyngeal Swab; Nasopharyngeal(NP) swabs in vial transport medium  Result Value Ref Range   SARS Coronavirus 2 by RT PCR NEGATIVE NEGATIVE    Comment: (NOTE) SARS-CoV-2 target nucleic acids are NOT DETECTED.  The SARS-CoV-2 RNA is generally detectable in upper respiratory specimens during the acute phase of infection. The  lowest concentration of SARS-CoV-2 viral copies this assay can detect is 138 copies/mL. A negative result does not preclude SARS-Cov-2 infection and should not be used as the sole basis for treatment or other patient management decisions. A negative result may occur with  improper specimen collection/handling, submission of specimen other than nasopharyngeal swab, presence of viral mutation(s) within the areas targeted by this assay, and inadequate number of viral copies(<138 copies/mL). A negative result must be combined with clinical observations, patient history, and epidemiological information. The expected result is Negative.  Fact Sheet for Patients:  EntrepreneurPulse.com.au  Fact Sheet for Healthcare Providers:  IncredibleEmployment.be  This test is no t yet approved or cleared by the [place] FDA and  has been authorized for detection and/or diagnosis of SARS-CoV-2 by FDA under an Emergency Use Authorization (EUA). This EUA will remain  in effect (meaning this test can be used) for the duration of the COVID-19 declaration under Section 564(b)(1) of the Act, 21 U.S.C.section 360bbb-3(b)(1), unless the authorization is terminated  or revoked sooner.       Influenza A by PCR NEGATIVE NEGATIVE   Influenza B by PCR NEGATIVE NEGATIVE    Comment: (NOTE) The Xpert Xpress SARS-CoV-2/FLU/RSV plus assay is intended as an aid in the diagnosis of influenza from Nasopharyngeal swab specimens and should not be used as a sole basis for treatment. Nasal washings and aspirates are unacceptable for Xpert Xpress SARS-CoV-2/FLU/RSV testing.  Fact Sheet for Patients: EntrepreneurPulse.com.au  Fact Sheet for Healthcare Providers: IncredibleEmployment.be  This test is not yet approved or cleared by the [place]  FDA and has been authorized for detection and/or diagnosis of SARS-CoV-2 by FDA under an Emergency  Use Authorization (EUA). This EUA will remain in effect (meaning this test can be used) for the duration of the COVID-19 declaration under Section 564(b)(1) of the Act, 21 U.S.C. section 360bbb-3(b)(1), unless the authorization is terminated or revoked.  Performed at [facility], [address], [place], [place] [address]     CT ABDOMEN PELVIS W CONTRAST  Result Date: 09/11/2021 CLINICAL DATA:  Left lower quadrant abdominal pain. EXAM: CT ABDOMEN AND PELVIS WITH CONTRAST TECHNIQUE: Multidetector CT imaging of the abdomen and pelvis was performed using the standard protocol following bolus administration of intravenous contrast. CONTRAST:  180mL OMNIPAQUE IOHEXOL 300 MG/ML  SOLN COMPARISON:  08/28/2021 FINDINGS: Lower chest: Unremarkable Hepatobiliary: No suspicious focal abnormality within the liver parenchyma. There is no evidence for gallstones, gallbladder wall thickening, or pericholecystic fluid. No intrahepatic or extrahepatic biliary dilation. Pancreas: No focal mass lesion. No dilatation of the main duct. No intraparenchymal cyst. No peripancreatic edema. Spleen: No splenomegaly. No focal mass lesion. Adrenals/Urinary Tract: No adrenal nodule or mass. Kidneys unremarkable. No evidence for hydroureter. As noted previously, there is focal bladder wall thickening towards the anterior dome, adjacent to the sigmoid colon. Stomach/Bowel: Stomach is unremarkable. No gastric wall thickening. No evidence of outlet obstruction. Duodenum is normally positioned as is the ligament of Treitz. No small bowel wall thickening. No small bowel dilatation. The terminal ileum is normal. Wall thickening and pericolonic edema/inflammation associated with the mid sigmoid colon is progressive since 08/28/2021. Extraluminal gas is now seen in the pericolonic fat cranial to the sigmoid colon (coronal image 84/series 5). Extraluminal gas dissects from the inferior wall the sigmoid colon into the wall  of the bladder dome where there is a 2.7 x 1.7 cm collection of gas and fluid in focal area of marked bladder wall thickening (well demonstrated coronal image 78/5 and sagittal image 79 of series 6). Vascular/Lymphatic: No abdominal aortic aneurysm mild left para-aortic lymphadenopathy (image 40/2) is progressive in the interval with mild lymphadenopathy in the left common iliac chain. Reproductive: Unremarkable. Other: Small volume intraperitoneal free fluid. Musculoskeletal: No worrisome lytic or sclerotic osseous abnormality. Degenerative changes noted L5-S1 disc. IMPRESSION: 1. Interval progression of wall thickening and pericolonic edema/inflammation associated with the mid sigmoid colon since 08/28/2021. New extraluminal gas is now seen in the pericolonic fat consistent with micro perforation. There is also evidence for dissection of inflammatory process from the inferior wall of the sigmoid colon into the bladder dome where there is a 2.7 x 1.7 cm intramural collection of gas and fluid suggesting bladder wall abscess. 2. Interval development of mild left para-aortic and left common iliac chain lymphadenopathy, likely reactive. 3. Small volume intraperitoneal free fluid. Electronically Signed   By: Misty Stanley M.D.   On: 09/11/2021 06:09     A/P: Lori Mcknight is an 35 y.o. female with morbid obesity, asthma, HTN, recurrent diverticulitis and small associated abscess  -IV Zosyn; abscess appears to be too small to drain, abuts bladder -Sips of liquids ok for now -MIVF -We will follow closely with you  Nadeen Landau, MD [facility] Use [email or web address] to contact on call provider

## 2021-09-11 NOTE — Plan of Care (Signed)
  Problem: Pain Managment: Goal: General experience of comfort will improve Outcome: Progressing   Problem: Safety: Goal: Ability to remain free from injury will improve Outcome: Progressing   Problem: Nutrition: Goal: Adequate nutrition will be maintained Outcome: Progressing   

## 2021-09-11 NOTE — ED Notes (Signed)
Report called to Loretta Plume and [facility] RN.

## 2021-09-11 NOTE — ED Triage Notes (Signed)
On Monday started having left lower abdominal pain, now having all over abdominal pain. "Cramping". N/V.

## 2021-09-11 NOTE — ED Provider Notes (Signed)
[facility] EMERGENCY DEPT Provider Note   CSN: [id number] Arrival date & time: 09/11/21  0249     History Chief Complaint  Patient presents with   Abdominal Pain    Lori Mcknight is a 35 y.o. female.  HPI     This is a 35 year old female with a history of asthma, obesity, diverticulitis, hypertension who presents with concerns for recurrent abdominal pain.  Patient reports she has had worsening left lower quadrant abdominal pain since Monday.  She states that has migrated across her abdomen.  She describes it as crampy.  Pain is rated at 9 out of 10.  She reports multiple episodes of nonbilious, nonbloody emesis.  She reports fever at home to 101.  Recently finished a course of Augmentin for diverticulitis that was diagnosed on 11/16.  At that time she had a CT scan that showed recurrence of her diverticulitis with a small abscess that was smaller than prior.  General surgery was consulted and felt that outpatient management was reasonable as she was not ill-appearing.  Patient had a previous admission in October for the same.  Past Medical History:  Diagnosis Date   Anemia    history of anemia   Anxiety    Asthma    inhaler as needed   Chest pain of uncertain etiology    intermittent left side chest pain   Chlamydia    Depression    Diverticulitis 2019   Endometriosis    Genital herpes    Headache    MIGRAINES   Hypertension    no meds since April   Hypothyroidism    Obese    Obesity, Class III, BMI 40-49.9 (morbid obesity) (HCC) 11/06/2011   PCOS (polycystic ovarian syndrome)    PONV (postoperative nausea and vomiting)    Shortness of breath    on exertion from weight    Patient Active Problem List   Diagnosis Date Noted   Generalized abdominal pain 05/09/2021   Diverticulitis of large intestine with abscess 05/09/2021   Hypokalemia 05/09/2021   Asthma, mild intermittent 05/09/2021   Diverticulitis 03/26/2020   Obesity, morbid, BMI 50 or  higher (HCC) 08/08/2019   Endometriosis s/p ablation 08/08/2019   Dysuria 08/08/2019   Family history of colon cancer in father 08/08/2019   Acute diverticulitis 08/07/2019   Incomplete bladder emptying 02/18/2018   DUB (dysfunctional uterine bleeding) 02/18/2018   Anemia 02/18/2018   Hypothyroid 01/29/2017   Migraines 08/26/2016   Chest pain 02/04/2016   Abnormal uterine bleeding (AUB) 01/09/2014   Thickened endometrium 12/02/2013   Essential hypertension 10/21/2013   PCOS (polycystic ovarian syndrome) 11/06/2011   Infertility, female 11/06/2011   Pelvic pain in female 08/01/2011    Past Surgical History:  Procedure Laterality Date   DILATION AND CURETTAGE OF UTERUS N/A 04/29/2018   Procedure: DILATATION AND CURETTAGE;  Surgeon: Allie Bossier, MD;  Location: [facility];  Service: Gynecology;  Laterality: N/A;   HYSTEROSCOPY WITH D & C N/A 04/06/2014   Procedure: DILATATION AND CURETTAGE /HYSTEROSCOPY ;  Surgeon: Tereso Newcomer, MD;  Location: [facility];  Service: Gynecology;  Laterality: N/A;   LAPAROSCOPY N/A 04/29/2018   Procedure: LAPAROSCOPY DIAGNOSTIC WITH PERITONEAL BIOPSY;  Surgeon: Allie Bossier, MD;  Location: [facility];  Service: Gynecology;  Laterality: N/A;   LEFT HEART CATH AND CORONARY ANGIOGRAPHY N/A 12/12/2016   Procedure: Left Heart Cath and Coronary Angiography;  Surgeon: Peter M Swaziland, MD;  Location: [facility] INVASIVE CV LAB;  Service: Cardiovascular;  Laterality: N/A;   WISDOM TOOTH EXTRACTION       OB History     Gravida  0   Para  0   Term  0   Preterm  0   AB  0   Living  0      SAB  0   IAB  0   Ectopic  0   Multiple  0   Live Births  0           Family History  Problem Relation Age of Onset   Heart disease Mother    Sleep apnea Mother    Depression Mother    Hypertension Mother    Kidney disease Mother    Cancer Father    Diabetes Father    Heart disease Father    Colon cancer Father        28s   Hypertension Father    Diabetes Sister     Hypertension Sister    Heart disease Maternal Grandmother    Diabetes Maternal Grandfather    Heart disease Maternal Grandfather    Colon cancer Cousin        died age 38, paternal cousin    Social History   Tobacco Use   Smoking status: Some Days    Packs/day: 0.25    Types: Cigarettes   Smokeless tobacco: Never   Tobacco comments:    2-3 cigerettes a week.  Vaping Use   Vaping Use: Never used  Substance Use Topics   Alcohol use: Not Currently    Alcohol/week: 0.0 standard drinks    Comment: occasonal    Drug use: Not Currently    Types: Marijuana    Comment: last use 3 months ago    Home Medications Prior to Admission medications   Medication Sig Start Date End Date Taking? Authorizing Provider  acetaminophen (TYLENOL) 325 MG tablet Take 2 tablets (650 mg total) by mouth every 6 (six) hours as needed for mild pain or fever. 05/22/21   Claiborne Rigg, NP  albuterol (VENTOLIN HFA) 108 (90 Base) MCG/ACT inhaler Inhale 2 puffs into the lungs every 6 (six) hours as needed for wheezing or shortness of breath. 05/22/21   Claiborne Rigg, NP  amoxicillin-clavulanate (AUGMENTIN) 875-125 MG tablet Take 1 tablet by mouth every 12 (twelve) hours. 08/28/21   Cheryll Cockayne, MD  cyclobenzaprine (FLEXERIL) 10 MG tablet Take 1 tablet (10 mg total) by mouth at bedtime. Patient not taking: Reported on 07/14/2021 05/06/21   Claiborne Rigg, NP  docusate sodium (COLACE) 100 MG capsule Take 1 capsule (100 mg total) by mouth 2 (two) times daily as needed. 07/17/21   Eric Form, PA-C  gabapentin (NEURONTIN) 300 MG capsule Take 1 capsule (300 mg total) by mouth 2 (two) times daily. Patient not taking: Reported on 07/14/2021 08/28/20   Rema Fendt, NP  ibuprofen (ADVIL) 200 MG tablet Take 3 tablets (600 mg total) by mouth every 6 (six) hours as needed for moderate pain. 07/17/21   Eric Form, PA-C  levothyroxine (EUTHYROX) 88 MCG tablet Take 88 mcg by mouth daily before breakfast.     [provider]  levothyroxine (SYNTHROID) 88 MCG tablet Take 1 tablet (88 mcg total) by mouth daily before breakfast. 05/15/21 06/14/21  Lanae Boast, MD  medroxyPROGESTERone (PROVERA) 10 MG tablet Take 1 tablet (10 mg total) by mouth daily. 08/15/21   Jerene Bears, MD  polyethylene glycol (MIRALAX / GLYCOLAX) 17 g packet Take 17  g by mouth 2 (two) times daily as needed for moderate constipation or severe constipation. 07/17/21   Eric Form, PA-C    Allergies    Dilaudid [hydromorphone]  Review of Systems   Review of Systems  Constitutional:  Positive for fever.  Respiratory:  Negative for shortness of breath.   Cardiovascular:  Negative for chest pain.  Gastrointestinal:  Positive for abdominal pain, nausea and vomiting.  Genitourinary:  Negative for dysuria.  All other systems reviewed and are negative.  Physical Exam Updated Vital Signs BP 106/70   Pulse 80   Temp 98.8 F (37.1 C) (Oral)   Resp 14   Ht 1.676 m (5\' 6" )   Wt (!) 163.3 kg   SpO2 94%   BMI 58.11 kg/m   Physical Exam Vitals and nursing note reviewed.  Constitutional:      Appearance: She is well-developed. She is obese. She is not toxic-appearing.  HENT:     Head: Normocephalic and atraumatic.     Mouth/Throat:     Mouth: Mucous membranes are moist.  Eyes:     Pupils: Pupils are equal, round, and reactive to light.  Cardiovascular:     Rate and Rhythm: Normal rate and regular rhythm.     Heart sounds: Normal heart sounds.  Pulmonary:     Effort: Pulmonary effort is normal. No respiratory distress.     Breath sounds: No wheezing.  Abdominal:     General: Bowel sounds are normal.     Palpations: Abdomen is soft.     Tenderness: There is abdominal tenderness in the left lower quadrant. There is no guarding or rebound.  Musculoskeletal:     Cervical back: Neck supple.  Skin:    General: Skin is warm and dry.  Neurological:     Mental Status: She is alert and oriented to person, place,  and time.  Psychiatric:        Mood and Affect: Mood normal.    ED Results / Procedures / Treatments   Labs (all labs ordered are listed, but only abnormal results are displayed) Labs Reviewed  COMPREHENSIVE METABOLIC PANEL - Abnormal; Notable for the following components:      Result Value   Glucose, Bld 113 (*)    All other components within normal limits  CULTURE, BLOOD (ROUTINE X 2)  CULTURE, BLOOD (ROUTINE X 2)  CBC WITH DIFFERENTIAL/PLATELET  LIPASE, BLOOD  HCG, QUANTITATIVE, PREGNANCY  URINALYSIS, ROUTINE W REFLEX MICROSCOPIC  PREGNANCY, URINE    EKG None  Radiology CT ABDOMEN PELVIS W CONTRAST  Result Date: 09/11/2021 CLINICAL DATA:  Left lower quadrant abdominal pain. EXAM: CT ABDOMEN AND PELVIS WITH CONTRAST TECHNIQUE: Multidetector CT imaging of the abdomen and pelvis was performed using the standard protocol following bolus administration of intravenous contrast. CONTRAST:  09/13/2021 OMNIPAQUE IOHEXOL 300 MG/ML  SOLN COMPARISON:  08/28/2021 FINDINGS: Lower chest: Unremarkable Hepatobiliary: No suspicious focal abnormality within the liver parenchyma. There is no evidence for gallstones, gallbladder wall thickening, or pericholecystic fluid. No intrahepatic or extrahepatic biliary dilation. Pancreas: No focal mass lesion. No dilatation of the main duct. No intraparenchymal cyst. No peripancreatic edema. Spleen: No splenomegaly. No focal mass lesion. Adrenals/Urinary Tract: No adrenal nodule or mass. Kidneys unremarkable. No evidence for hydroureter. As noted previously, there is focal bladder wall thickening towards the anterior dome, adjacent to the sigmoid colon. Stomach/Bowel: Stomach is unremarkable. No gastric wall thickening. No evidence of outlet obstruction. Duodenum is normally positioned as is the ligament of Treitz. No  small bowel wall thickening. No small bowel dilatation. The terminal ileum is normal. Wall thickening and pericolonic edema/inflammation associated with  the mid sigmoid colon is progressive since 08/28/2021. Extraluminal gas is now seen in the pericolonic fat cranial to the sigmoid colon (coronal image 84/series 5). Extraluminal gas dissects from the inferior wall the sigmoid colon into the wall of the bladder dome where there is a 2.7 x 1.7 cm collection of gas and fluid in focal area of marked bladder wall thickening (well demonstrated coronal image 78/5 and sagittal image 79 of series 6). Vascular/Lymphatic: No abdominal aortic aneurysm mild left para-aortic lymphadenopathy (image 40/2) is progressive in the interval with mild lymphadenopathy in the left common iliac chain. Reproductive: Unremarkable. Other: Small volume intraperitoneal free fluid. Musculoskeletal: No worrisome lytic or sclerotic osseous abnormality. Degenerative changes noted L5-S1 disc. IMPRESSION: 1. Interval progression of wall thickening and pericolonic edema/inflammation associated with the mid sigmoid colon since 08/28/2021. New extraluminal gas is now seen in the pericolonic fat consistent with micro perforation. There is also evidence for dissection of inflammatory process from the inferior wall of the sigmoid colon into the bladder dome where there is a 2.7 x 1.7 cm intramural collection of gas and fluid suggesting bladder wall abscess. 2. Interval development of mild left para-aortic and left common iliac chain lymphadenopathy, likely reactive. 3. Small volume intraperitoneal free fluid. Electronically Signed   By: [name] M.D.   On: 09/11/2021 06:09    Procedures Procedures   Medications Ordered in ED Medications  piperacillin-tazobactam (ZOSYN) IVPB 3.375 g (has no administration in time range)  morphine 4 MG/ML injection 4 mg (4 mg Intravenous Given 09/11/21 0405)  ondansetron (ZOFRAN) injection 4 mg (4 mg Intravenous Given 09/11/21 0403)  iohexol (OMNIPAQUE) 300 MG/ML solution 100 mL (100 mLs Intravenous Contrast Given 09/11/21 0547)  fentaNYL (SUBLIMAZE) injection  100 mcg (100 mcg Intravenous Given 09/11/21 1610)    ED Course  I have reviewed the triage vital signs and the nursing notes.  Pertinent labs & imaging results that were available during my care of the patient were reviewed by me and considered in my medical decision making (see chart for details).  Clinical Course as of 09/11/21 9604  Wed Sep 11, 2021  5409 Spoke with Dr. Janee Morn, general surgery.  Agrees with IV Zosyn.  They will consult when patient arrives to the hospital.  Recommends admission to the hospitalist. [CH]    Clinical Course User Index [CH] Ranell Finelli, Mayer Masker, MD   MDM Rules/Calculators/A&P                           Patient presents with recurrent abdominal pain and fevers.  Reports multiple episodes of nausea and vomiting.  She is morbidly obese which significantly limits her exam but she is tender of the abdomen.  She is afebrile.  Initial lab work obtained and largely reassuring.  She has no significant leukocytosis although it does not appear she previously has had significant leukocytosis.  No metabolic derangements.  CT scan obtained given limitations of physical exam.  See CT scan above.  It appears that she has worsening evidence of diverticulitis with likely abscess adjacent or on the bladder wall.  Patient was given IV Zosyn.  See clinical course above and discussion with Dr. Janee Morn, general surgery.  We will plan for admission to the hospitalist.   Final Clinical Impression(s) / ED Diagnoses Final diagnoses:  Diverticulitis of large intestine with abscess  without bleeding    Rx / DC Orders ED Discharge Orders     None        Dandra Velardi, Mayer Masker, MD 09/11/21 [id number]

## 2021-09-12 LAB — BLOOD CULTURE ID PANEL (REFLEXED) - BCID2

## 2021-09-12 MED ORDER — PANTOPRAZOLE SODIUM 40 MG IV SOLR
40.0000 mg | INTRAVENOUS | Status: DC
  Administered 2021-09-12 – 2021-09-15 (×4): 40 mg via INTRAVENOUS
  Filled 2021-09-12 (×4): qty 40

## 2021-09-12 MED ORDER — ALUM & MAG HYDROXIDE-SIMETH 200-200-20 MG/5ML PO SUSP
30.0000 mL | Freq: Four times a day (QID) | ORAL | Status: DC | PRN
  Administered 2021-09-12 – 2021-09-16 (×6): 30 mL via ORAL
  Filled 2021-09-12 (×6): qty 30

## 2021-09-12 MED ORDER — ENOXAPARIN SODIUM 80 MG/0.8ML IJ SOSY
80.0000 mg | PREFILLED_SYRINGE | Freq: Two times a day (BID) | INTRAMUSCULAR | Status: AC
  Administered 2021-09-12 – 2021-09-16 (×10): 80 mg via SUBCUTANEOUS
  Filled 2021-09-12 (×10): qty 0.8

## 2021-09-12 MED ORDER — ONDANSETRON HCL 4 MG/2ML IJ SOLN
4.0000 mg | Freq: Four times a day (QID) | INTRAMUSCULAR | Status: DC | PRN
  Administered 2021-09-12 – 2021-09-20 (×8): 4 mg via INTRAVENOUS
  Filled 2021-09-12 (×8): qty 2

## 2021-09-12 MED ORDER — RIVAROXABAN 10 MG PO TABS: 10.0000 mg | ORAL_TABLET | Freq: Every day | ORAL | Status: DC

## 2021-09-12 NOTE — Assessment & Plan Note (Signed)
The patient has been continued on synthroid at 88 mcg daily as at home.

## 2021-09-12 NOTE — Progress Notes (Signed)
PROGRESS NOTE    Lori Mcknight  UKG:[id number] DOB: Aug 13, 1986 DOA: 09/11/2021 PCP: Claiborne Rigg, NP    Chief Complaint  Patient presents with   Abdominal Pain    Brief Narrative:   The patient is a 35 yr old woman who has had multiple recurrences of diverticulitis with abscess formation, depression anxiety, anemia, hypertension, hypothyroidism, PCOS, super morbid obesity presents this time with severe abdominal pain.  CT of the abdomen and pelvis demonstrated increase in the size of the abscess.  She was started on IV Zosyn and general surgery consulted. General surgery recommended IV antibiotics and clear sips.   Assessment & Plan:   Principal Problem:   Diverticulitis of large intestine with abscess Active Problems:   PCOS (polycystic ovarian syndrome)   Essential hypertension   Hypothyroid   Anemia   Obesity, morbid, BMI 50 or higher (HCC)   Acute diverticulitis of the large intestine with abscess formation This is a chronic and recurrent issue for the patient Attending MD has discussed with Dr. Cliffton Asters suggested to the abscess has increased in size it remains too small for percutaneous drainage.  Furthermore given the patient's body habitus there is high risk for complications and having the drain being able to reach the skin.  Does not recommend an open procedure at this time due to acute infection and status complications.  General surgery recommends treatment with IV antibiotics and watchful observation.  As the abscess appears to be sitting between the sigmoid colon and the bladder there is likelihood of fistula formation allowing for natural drainage of the abscess.  If she continues to worsen she will probably end up with a partial colon resection at sometime in the future. Patient currently is on IV Zosyn continue the same continue IV fluids.  Blood cultures were positive for staph epidermidis.   Staph epidermidis bacteremia Continue with IV Zosyn for now. Will get  echocardiogram.    Morbid obesity Body mass index is 58.11 kg/m. Increased risk of mortality and morbidity.    Essential hypertension -Blood pressure parameters are optimal at this time.    Hypothyroidism Continue with Synthroid.   Anxiety and depression Continue with home medications.   History of PCOS Patient will probably need to be on metformin on discharge.     DVT prophylaxis: (Lovenox/) Code Status: (Full code) Family Communication: no family at bedside.  Disposition:   Status is: Inpatient  Remains inpatient appropriate because: IV antibiotics.        Consultants:  Surgery.   Procedures: none.   Antimicrobials:  Antibiotics Given (last 72 hours)     Date/Time Action Medication Dose Rate   09/11/21 0659 New Bag/Given   piperacillin-tazobactam (ZOSYN) IVPB 3.375 g 3.375 g 100 mL/hr   09/11/21 1554 New Bag/Given   piperacillin-tazobactam (ZOSYN) IVPB 3.375 g 3.375 g 12.5 mL/hr   09/11/21 2341 New Bag/Given   piperacillin-tazobactam (ZOSYN) IVPB 3.375 g 3.375 g 12.5 mL/hr   09/12/21 0752 New Bag/Given   piperacillin-tazobactam (ZOSYN) IVPB 3.375 g 3.375 g 12.5 mL/hr   09/12/21 1537 New Bag/Given   piperacillin-tazobactam (ZOSYN) IVPB 3.375 g 3.375 g 12.5 mL/hr         Subjective: Substernal chest pain on deep inspiration, reports feels like class.   Objective: Vitals:   09/11/21 2050 09/11/21 2308 09/12/21 0426 09/12/21 1400  BP: 117/82 130/85 (!) 144/88 132/86  Pulse: 87 80 89 96  Resp: 20 20 20 20   Temp: 98.1 F (36.7 C) 98.4 F (36.9 C)  98.1 F (36.7 C) 98.3 F (36.8 C)  TempSrc: Axillary Oral Oral Oral  SpO2: 96% 95% 94% 95%  Weight:      Height:        Intake/Output Summary (Last 24 hours) at 09/12/2021 1605 Last data filed at 09/12/2021 1500 Gross per 24 hour  Intake 169.66 ml  Output --  Net 169.66 ml   Filed Weights   09/11/21 0303  Weight: (!) 163.3 kg    Examination:  General exam: Appears calm and  comfortable  Respiratory system: Clear to auscultation. Respiratory effort normal. Cardiovascular system: S1 & S2 heard, RRR. No JVD, murmurs,  No pedal edema. Gastrointestinal system: Abdomen is soft tender, and non distended, bowel sounds wnl.  Central nervous system: Alert and oriented. No focal neurological deficits. Extremities: Symmetric 5 x 5 power. Skin: No rashes, lesions or ulcers Psychiatry: Mood & affect appropriate.     Data Reviewed: I have personally reviewed following labs and imaging studies  CBC: Recent Labs  Lab 09/11/21 0400  WBC 5.4  NEUTROABS 3.3  HGB 14.8  HCT 45.4  MCV 95.8  PLT 203    Basic Metabolic Panel: Recent Labs  Lab 09/11/21 0400  NA 139  K 3.5  CL 105  CO2 24  GLUCOSE 113*  BUN 9  CREATININE 0.75  CALCIUM 9.5    GFR: Estimated Creatinine Clearance: 154.9 mL/min (by C-G formula based on SCr of 0.75 mg/dL).  Liver Function Tests: Recent Labs  Lab 09/11/21 0400  AST 16  ALT 17  ALKPHOS 64  BILITOT 0.7  PROT 7.7  ALBUMIN 4.1    CBG: No results for input(s): GLUCAP in the last 168 hours.   Recent Results (from the past 240 hour(s))  Blood culture (routine x 2)     Status: None (Preliminary result)   Collection Time: 09/11/21  6:42 AM   Specimen: BLOOD  Result Value Ref Range Status   Specimen Description   Final    BLOOD BLOOD LEFT FOREARM Performed at [facility], [address] [place], [place] [address]    Special Requests   Final    Blood Culture adequate volume BOTTLES DRAWN AEROBIC AND ANAEROBIC Performed at [facility], [address], [place], [place] [address]    Culture  Setup Time   Final    GRAM POSITIVE COCCI IN BOTH AEROBIC AND ANAEROBIC BOTTLES CRITICAL RESULT CALLED TO, READ BACK BY AND VERIFIED WITH: PHARMD K.SHADE AT 1213 ON 09/12/2021 BY T.SAAD. Performed at [facility], [address] [place], [place] [address]    Culture GRAM POSITIVE COCCI   Final   Report Status PENDING  Incomplete  Blood Culture ID Panel (Reflexed)     Status: Abnormal   Collection Time: 09/11/21  6:42 AM  Result Value Ref Range Status   Enterococcus faecalis NOT DETECTED NOT DETECTED Final   Enterococcus Faecium NOT DETECTED NOT DETECTED Final   Listeria monocytogenes NOT DETECTED NOT DETECTED Final   Staphylococcus species DETECTED (A) NOT DETECTED Final    Comment: CRITICAL RESULT CALLED TO, READ BACK BY AND VERIFIED WITH: PHARMD K.SHADE AT 1213 ON 09/12/2021 BY T.SAAD.    Staphylococcus aureus (BCID) NOT DETECTED NOT DETECTED Final   Staphylococcus epidermidis DETECTED (A) NOT DETECTED Final    Comment: CRITICAL RESULT CALLED TO, READ BACK BY AND VERIFIED WITH: PHARMD K.SHADE AT 1213 ON 09/12/2021 BY T.SAAD.    Staphylococcus lugdunensis NOT DETECTED NOT DETECTED Final   Streptococcus species NOT DETECTED  NOT DETECTED Final   Streptococcus agalactiae NOT DETECTED NOT DETECTED Final   Streptococcus pneumoniae NOT DETECTED NOT DETECTED Final   Streptococcus pyogenes NOT DETECTED NOT DETECTED Final   A.calcoaceticus-baumannii NOT DETECTED NOT DETECTED Final   Bacteroides fragilis NOT DETECTED NOT DETECTED Final   Enterobacterales NOT DETECTED NOT DETECTED Final   Enterobacter cloacae complex NOT DETECTED NOT DETECTED Final   Escherichia coli NOT DETECTED NOT DETECTED Final   Klebsiella aerogenes NOT DETECTED NOT DETECTED Final   Klebsiella oxytoca NOT DETECTED NOT DETECTED Final   Klebsiella pneumoniae NOT DETECTED NOT DETECTED Final   Proteus species NOT DETECTED NOT DETECTED Final   Salmonella species NOT DETECTED NOT DETECTED Final   Serratia marcescens NOT DETECTED NOT DETECTED Final   Haemophilus influenzae NOT DETECTED NOT DETECTED Final   Neisseria meningitidis NOT DETECTED NOT DETECTED Final   Pseudomonas aeruginosa NOT DETECTED NOT DETECTED Final   Stenotrophomonas maltophilia NOT DETECTED NOT DETECTED Final   Candida albicans NOT  DETECTED NOT DETECTED Final   Candida auris NOT DETECTED NOT DETECTED Final   Candida glabrata NOT DETECTED NOT DETECTED Final   Candida krusei NOT DETECTED NOT DETECTED Final   Candida parapsilosis NOT DETECTED NOT DETECTED Final   Candida tropicalis NOT DETECTED NOT DETECTED Final   Cryptococcus neoformans/gattii NOT DETECTED NOT DETECTED Final   Methicillin resistance mecA/C NOT DETECTED NOT DETECTED Final    Comment: Performed at [facility], [address] [place], [place] [address]  Resp Panel by RT-PCR (Flu A&B, Covid) Nasopharyngeal Swab     Status: None   Collection Time: 09/11/21 10:45 AM   Specimen: Nasopharyngeal Swab; Nasopharyngeal(NP) swabs in vial transport medium  Result Value Ref Range Status   SARS Coronavirus 2 by RT PCR NEGATIVE NEGATIVE Final    Comment: (NOTE) SARS-CoV-2 target nucleic acids are NOT DETECTED.  The SARS-CoV-2 RNA is generally detectable in upper respiratory specimens during the acute phase of infection. The lowest concentration of SARS-CoV-2 viral copies this assay can detect is 138 copies/mL. A negative result does not preclude SARS-Cov-2 infection and should not be used as the sole basis for treatment or other patient management decisions. A negative result may occur with  improper specimen collection/handling, submission of specimen other than nasopharyngeal swab, presence of viral mutation(s) within the areas targeted by this assay, and inadequate number of viral copies(<138 copies/mL). A negative result must be combined with clinical observations, patient history, and epidemiological information. The expected result is Negative.  Fact Sheet for Patients:  BloggerCourse.com  Fact Sheet for Healthcare Providers:  SeriousBroker.it  This test is no t yet approved or cleared by the [place] FDA and  has been authorized for detection and/or diagnosis of SARS-CoV-2 by FDA under an  Emergency Use Authorization (EUA). This EUA will remain  in effect (meaning this test can be used) for the duration of the COVID-19 declaration under Section 564(b)(1) of the Act, 21 U.S.C.section 360bbb-3(b)(1), unless the authorization is terminated  or revoked sooner.       Influenza A by PCR NEGATIVE NEGATIVE Final   Influenza B by PCR NEGATIVE NEGATIVE Final    Comment: (NOTE) The Xpert Xpress SARS-CoV-2/FLU/RSV plus assay is intended as an aid in the diagnosis of influenza from Nasopharyngeal swab specimens and should not be used as a sole basis for treatment. Nasal washings and aspirates are unacceptable for Xpert Xpress SARS-CoV-2/FLU/RSV testing.  Fact Sheet for Patients: BloggerCourse.com  Fact Sheet for Healthcare Providers: SeriousBroker.it  This test  is not yet approved or cleared by the [place] and has been authorized for detection and/or diagnosis of SARS-CoV-2 by FDA under an Emergency Use Authorization (EUA). This EUA will remain in effect (meaning this test can be used) for the duration of the COVID-19 declaration under Section 564(b)(1) of the Act, 21 U.S.C. section 360bbb-3(b)(1), unless the authorization is terminated or revoked.  Performed at [facility], [address], [place], [place] [address]          Radiology Studies: CT ABDOMEN PELVIS W CONTRAST  Result Date: 09/11/2021 CLINICAL DATA:  Left lower quadrant abdominal pain. EXAM: CT ABDOMEN AND PELVIS WITH CONTRAST TECHNIQUE: Multidetector CT imaging of the abdomen and pelvis was performed using the standard protocol following bolus administration of intravenous contrast. CONTRAST:  OMNIPAQUE IOHEXOL 300 MG/ML  SOLN COMPARISON:  08/28/2021 FINDINGS: Lower chest: Unremarkable Hepatobiliary: No suspicious focal abnormality within the liver parenchyma. There is no evidence for gallstones, gallbladder wall  thickening, or pericholecystic fluid. No intrahepatic or extrahepatic biliary dilation. Pancreas: No focal mass lesion. No dilatation of the main duct. No intraparenchymal cyst. No peripancreatic edema. Spleen: No splenomegaly. No focal mass lesion. Adrenals/Urinary Tract: No adrenal nodule or mass. Kidneys unremarkable. No evidence for hydroureter. As noted previously, there is focal bladder wall thickening towards the anterior dome, adjacent to the sigmoid colon. Stomach/Bowel: Stomach is unremarkable. No gastric wall thickening. No evidence of outlet obstruction. Duodenum is normally positioned as is the ligament of Treitz. No small bowel wall thickening. No small bowel dilatation. The terminal ileum is normal. Wall thickening and pericolonic edema/inflammation associated with the mid sigmoid colon is progressive since 08/28/2021. Extraluminal gas is now seen in the pericolonic fat cranial to the sigmoid colon (coronal image 84/series 5). Extraluminal gas dissects from the inferior wall the sigmoid colon into the wall of the bladder dome where there is a 2.7 x 1.7 cm collection of gas and fluid in focal area of marked bladder wall thickening (well demonstrated coronal image 78/5 and sagittal image 79 of series 6). Vascular/Lymphatic: No abdominal aortic aneurysm mild left para-aortic lymphadenopathy (image 40/2) is progressive in the interval with mild lymphadenopathy in the left common iliac chain. Reproductive: Unremarkable. Other: Small volume intraperitoneal free fluid. Musculoskeletal: No worrisome lytic or sclerotic osseous abnormality. Degenerative changes noted L5-S1 disc. IMPRESSION: 1. Interval progression of wall thickening and pericolonic edema/inflammation associated with the mid sigmoid colon since 08/28/2021. New extraluminal gas is now seen in the pericolonic fat consistent with micro perforation. There is also evidence for dissection of inflammatory process from the inferior wall of the sigmoid  colon into the bladder dome where there is a 2.7 x 1.7 cm intramural collection of gas and fluid suggesting bladder wall abscess. 2. Interval development of mild left para-aortic and left common iliac chain lymphadenopathy, likely reactive. 3. Small volume intraperitoneal free fluid. Electronically Signed   By: [name] M.D.   On: 09/11/2021 06:09        Scheduled Meds:  enoxaparin (LOVENOX) injection  80 mg Subcutaneous Q12H   levothyroxine  88 mcg Oral QAC breakfast   medroxyPROGESTERone  10 mg Oral Daily   pantoprazole (PROTONIX) IV  40 mg Intravenous Q24H   Continuous Infusions:  sodium chloride 10 mL/hr at 09/11/21 0658   piperacillin-tazobactam (ZOSYN)  IV 3.375 g (09/12/21 1537)     LOS: 1 day        Kathlen Mody, MD [facility]   To contact the attending provider between  7A-7P or the covering provider during after hours 7P-7A, please log into the web site [email or web address] and access using universal [place] password for that web site. If you do not have the password, please call the hospital operator.  09/12/2021, 4:05 PM

## 2021-09-12 NOTE — H&P (Signed)
Lori Mcknight is an 35 y.o. female.   Chief Complaint: Abdominal pain HPI: The patient is a 35 yr old woman who has had multiple recurrences of diverticulitis with abscess. She was seen earlier this month with the abscess, but it was felt to be too small for intervention. She was placed on PO Augmentin and sent home. In the last 2 days she has developed crampy suprapubic pain. These have become more intense. On presentation the patient is rolling about in the bed complaining of severe lower abdominal pain. CT of the abdomen and pelvis demonstrated increase in size of the abscess. The patient was placed on IV zosyn, blood cultures x 2 were drawn, and she was transferred to [name] for further evaluation and treatment.  The patient admits to fevers, but no chills. She has had vomiting and states that she has only been able to keep down liquids and that she throws everything else up. No diarrhea, no constipation, no cough, sputum production, shortness of breath. No neurological changes, choryza, chest pain, back pain, dysuria, rashes, sores, or lesions.  Past medical history is significant for: Super-morbid obesity (BMI 58.11), Depression/anxiety, anemia, endometriosis, genital herpes, hypertension, hypothyroidism, PCOS, DOE.  The patient has been admitted to a medical bed. She is receiving pain control and antiemetics.  General surgery was consulted. I have discussed the patient with Dr. Cliffton Asters. He states that currently the patient's abscess, although increased in size, remains to small for percutaneous drainage. Furthermore, given the patient's body habitus there is high risk of the complication in having the drain being able to reach the skin. He does not recommend an open procedure at this time due to acute infection and again risk of serious complication. He recommends treatment with IV antibiotics and monitoring. As the abscess appears to be sitting between the sigmoid colon and the bladder, her  feels that a fistula is likely to form allowing for a natural drainage of the abscess. He does fee that she will need to have a partial colon resection at some time in the future as she has had multiple recurrences of the problem.  Past Medical History:  Diagnosis Date   Anemia    history of anemia   Anxiety    Asthma    inhaler as needed   Chest pain of uncertain etiology    intermittent left side chest pain   Chlamydia    Depression    Diverticulitis 2019   Endometriosis    Genital herpes    Headache    MIGRAINES   Hypertension    no meds since April   Hypothyroidism    Obese    Obesity, Class III, BMI 40-49.9 (morbid obesity) (HCC) 11/06/2011   PCOS (polycystic ovarian syndrome)    PONV (postoperative nausea and vomiting)    Shortness of breath    on exertion from weight    Past Surgical History:  Procedure Laterality Date   DILATION AND CURETTAGE OF UTERUS N/A 04/29/2018   Procedure: DILATATION AND CURETTAGE;  Surgeon: Allie Bossier, MD;  Location: [facility];  Service: Gynecology;  Laterality: N/A;   HYSTEROSCOPY WITH D & C N/A 04/06/2014   Procedure: DILATATION AND CURETTAGE /HYSTEROSCOPY ;  Surgeon: Tereso Newcomer, MD;  Location: [facility];  Service: Gynecology;  Laterality: N/A;   LAPAROSCOPY N/A 04/29/2018   Procedure: LAPAROSCOPY DIAGNOSTIC WITH PERITONEAL BIOPSY;  Surgeon: Allie Bossier, MD;  Location: [facility];  Service: Gynecology;  Laterality: N/A;   LEFT HEART CATH  AND CORONARY ANGIOGRAPHY N/A 12/12/2016   Procedure: Left Heart Cath and Coronary Angiography;  Surgeon: Peter M Swaziland, MD;  Location: [facility] INVASIVE CV LAB;  Service: Cardiovascular;  Laterality: N/A;   WISDOM TOOTH EXTRACTION      Family History  Problem Relation Age of Onset   Heart disease Mother    Sleep apnea Mother    Depression Mother    Hypertension Mother    Kidney disease Mother    Cancer Father    Diabetes Father    Heart disease Father    Colon cancer Father        33s   Hypertension Father     Diabetes Sister    Hypertension Sister    Heart disease Maternal Grandmother    Diabetes Maternal Grandfather    Heart disease Maternal Grandfather    Colon cancer Cousin        died age 14, paternal cousin   Social History:  reports that she has been smoking cigarettes. She has been smoking an average of .25 packs per day. She has never used smokeless tobacco. She reports that she does not currently use alcohol. She reports that she does not currently use drugs after having used the following drugs: Marijuana. Medications Prior to Admission  Medication Sig Dispense Refill   albuterol (VENTOLIN HFA) 108 (90 Base) MCG/ACT inhaler Inhale 2 puffs into the lungs every 6 (six) hours as needed for wheezing or shortness of breath. 1 each 2   naproxen sodium (ALEVE) 220 MG tablet Take 440 mg by mouth 2 (two) times daily as needed (pain/headache).     acetaminophen (TYLENOL) 325 MG tablet Take 2 tablets (650 mg total) by mouth every 6 (six) hours as needed for mild pain or fever. (Patient not taking: Reported on 09/11/2021) 60 tablet 0   amoxicillin-clavulanate (AUGMENTIN) 875-125 MG tablet Take 1 tablet by mouth every 12 (twelve) hours. (Patient not taking: Reported on 09/11/2021) 14 tablet 0   cyclobenzaprine (FLEXERIL) 10 MG tablet Take 1 tablet (10 mg total) by mouth at bedtime. (Patient not taking: Reported on 07/14/2021) 30 tablet 0   docusate sodium (COLACE) 100 MG capsule Take 1 capsule (100 mg total) by mouth 2 (two) times daily as needed. (Patient not taking: Reported on 09/11/2021)     ibuprofen (ADVIL) 200 MG tablet Take 3 tablets (600 mg total) by mouth every 6 (six) hours as needed for moderate pain. (Patient not taking: Reported on 09/11/2021) 30 tablet 0   levothyroxine (SYNTHROID) 88 MCG tablet Take 1 tablet (88 mcg total) by mouth daily before breakfast. (Patient not taking: Reported on 09/11/2021) 30 tablet 0   levothyroxine (SYNTHROID) 88 MCG tablet Take 88 mcg by mouth daily before  breakfast. (Patient not taking: Reported on 09/11/2021)     medroxyPROGESTERone (PROVERA) 10 MG tablet Take 1 tablet (10 mg total) by mouth daily. (Patient not taking: Reported on 09/11/2021) 10 tablet 0   polyethylene glycol (MIRALAX / GLYCOLAX) 17 g packet Take 17 g by mouth 2 (two) times daily as needed for moderate constipation or severe constipation. (Patient not taking: Reported on 09/11/2021)  0    Allergies:  Allergies  Allergen Reactions   Dilaudid [Hydromorphone] Rash    Itching all over body    ROS: 12 systems were reviewed with the patient. All are negative except for those elements included in HPI above.  Exam:  Constitutional:  The patient is awake, alert, and oriented x 3. She is in moderate distress from abdominal  discomfort. She is unable to lie still. Eyes:  pupils and irises appear normal Normal lids and conjunctivae ENMT:  grossly normal hearing  Lips appear normal external ears, nose appear normal Oropharynx: mucosa, tongue,posterior pharynx appear normal Neck:  neck appears normal, no masses, normal ROM, supple no thyromegaly No carotid bruits Respiratory:  No increased work of breathing. No wheezes, rales, or rhonchi No tactile fremitus Cardiovascular:  Regular rate and rhythm No murmurs, ectopy, or gallups. No lateral PMI. No thrills. Abdomen:  Abdomen is soft and non-distended The abdomen is tender to palpation diffusely, but extremely tender to the suprapubic and LLQ. Morbidly obese. No hernias, masses, or organomegaly can be appreciated due to body habitus. I am unable to appreciate bowel sounds due to body habitus. Musculoskeletal:  No cyanosis, clubbing, or edema Skin:  No rashes, lesions, ulcers palpation of skin: no induration or nodules Neurologic:  CN 2-12 intact Sensation all 4 extremities intact Psychiatric:  Mental status Mood, affect appropriate Orientation to person, place, time  judgment and insight appear intact Moving  all extremities.  Results for orders placed or performed during the hospital encounter of 09/11/21 (from the past 48 hour(s))  Urinalysis, Routine w reflex microscopic Urine, Clean Catch     Status: Abnormal   Collection Time: 09/11/21  4:00 AM  Result Value Ref Range   Color, Urine YELLOW YELLOW   APPearance CLEAR CLEAR   Specific Gravity, Urine >1.046 (H) 1.005 - 1.030   pH 6.5 5.0 - 8.0   Glucose, UA NEGATIVE NEGATIVE mg/dL   Hgb urine dipstick MODERATE (A) NEGATIVE   Bilirubin Urine NEGATIVE NEGATIVE   Ketones, ur NEGATIVE NEGATIVE mg/dL   Protein, ur 30 (A) NEGATIVE mg/dL   Nitrite NEGATIVE NEGATIVE   Leukocytes,Ua NEGATIVE NEGATIVE   RBC / HPF 0-5 0 - 5 RBC/hpf   WBC, UA 0-5 0 - 5 WBC/hpf   Squamous Epithelial / LPF 0-5 0 - 5   Mucus PRESENT     Comment: Performed at [facility], [address], [place], [place] [address]  CBC with Differential     Status: None   Collection Time: 09/11/21  4:00 AM  Result Value Ref Range   WBC 5.4 4.0 - 10.5 K/uL   RBC 4.74 3.87 - 5.11 MIL/uL   Hemoglobin 14.8 12.0 - 15.0 g/dL   HCT 60.4 54.0 - 98.1 %   MCV 95.8 80.0 - 100.0 fL   MCH 31.2 26.0 - 34.0 pg   MCHC 32.6 30.0 - 36.0 g/dL   RDW 19.1 47.8 - 29.5 %   Platelets 203 150 - 400 K/uL   nRBC 0.0 0.0 - 0.2 %   Neutrophils Relative % 61 %   Neutro Abs 3.3 1.7 - 7.7 K/uL   Lymphocytes Relative 33 %   Lymphs Abs 1.8 0.7 - 4.0 K/uL   Monocytes Relative 5 %   Monocytes Absolute 0.3 0.1 - 1.0 K/uL   Eosinophils Relative 1 %   Eosinophils Absolute 0.1 0.0 - 0.5 K/uL   Basophils Relative 0 %   Basophils Absolute 0.0 0.0 - 0.1 K/uL   Immature Granulocytes 0 %   Abs Immature Granulocytes 0.01 0.00 - 0.07 K/uL    Comment: Performed at [facility], [address], [place], [place] [address]  Comprehensive metabolic panel     Status: Abnormal   Collection Time: 09/11/21  4:00 AM  Result Value Ref Range   Sodium 139 135 - 145 mmol/L   Potassium  3.5  3.5 - 5.1 mmol/L   Chloride 105 98 - 111 mmol/L   CO2 24 22 - 32 mmol/L   Glucose, Bld 113 (H) 70 - 99 mg/dL    Comment: Glucose reference range applies only to samples taken after fasting for at least 8 hours.   BUN 9 6 - 20 mg/dL   Creatinine, Ser 4.09 0.44 - 1.00 mg/dL   Calcium 9.5 8.9 - 81.1 mg/dL   Total Protein 7.7 6.5 - 8.1 g/dL   Albumin 4.1 3.5 - 5.0 g/dL   AST 16 15 - 41 U/L   ALT 17 0 - 44 U/L   Alkaline Phosphatase 64 38 - 126 U/L   Total Bilirubin 0.7 0.3 - 1.2 mg/dL   GFR, Estimated >91 >47 mL/min    Comment: (NOTE) Calculated using the CKD-EPI Creatinine Equation (2021)    Anion gap 10 5 - 15    Comment: Performed at [facility], [address] [place], [place] [address]  Lipase, blood     Status: None   Collection Time: 09/11/21  4:00 AM  Result Value Ref Range   Lipase 42 11 - 51 U/L    Comment: Performed at [facility], [address], [place], [place] [address]  hCG, quantitative, pregnancy     Status: None   Collection Time: 09/11/21  4:02 AM  Result Value Ref Range   hCG, Beta Chain, Quant, S <1 <5 mIU/mL    Comment:          GEST. AGE      CONC.  (mIU/mL)   <=1 WEEK        5 - 50     2 WEEKS       50 - 500     3 WEEKS       100 - 10,000     4 WEEKS     1,000 - 30,000     5 WEEKS     3,500 - 115,000   6-8 WEEKS     12,000 - 270,000    12 WEEKS     15,000 - 220,000        FEMALE AND NON-PREGNANT FEMALE:     LESS THAN 5 mIU/mL Performed at [facility], [address], [place], [place] [address]   Blood culture (routine x 2)     Status: None (Preliminary result)   Collection Time: 09/11/21  6:42 AM   Specimen: BLOOD  Result Value Ref Range   Specimen Description      BLOOD BLOOD LEFT FOREARM Performed at [facility], [address], [place], [place] [address]    Special Requests      Blood Culture adequate volume BOTTLES DRAWN AEROBIC AND ANAEROBIC Performed  at [facility], [address], [place], [place] [address]    Culture  Setup Time      GRAM POSITIVE COCCI IN BOTH AEROBIC AND ANAEROBIC BOTTLES Organism ID to follow Performed at [facility], [address], [place], [place] [address]    Culture GRAM POSITIVE COCCI    Report Status PENDING   Resp Panel by RT-PCR (Flu A&B, Covid) Nasopharyngeal Swab     Status: None   Collection Time: 09/11/21 10:45 AM   Specimen: Nasopharyngeal Swab; Nasopharyngeal(NP) swabs in vial transport medium  Result Value Ref Range   SARS Coronavirus 2 by RT PCR NEGATIVE NEGATIVE    Comment: (NOTE) SARS-CoV-2 target nucleic acids are NOT DETECTED.  The SARS-CoV-2  RNA is generally detectable in upper respiratory specimens during the acute phase of infection. The lowest concentration of SARS-CoV-2 viral copies this assay can detect is 138 copies/mL. A negative result does not preclude SARS-Cov-2 infection and should not be used as the sole basis for treatment or other patient management decisions. A negative result may occur with  improper specimen collection/handling, submission of specimen other than nasopharyngeal swab, presence of viral mutation(s) within the areas targeted by this assay, and inadequate number of viral copies(<138 copies/mL). A negative result must be combined with clinical observations, patient history, and epidemiological information. The expected result is Negative.  Fact Sheet for Patients:  BloggerCourse.com  Fact Sheet for Healthcare Providers:  SeriousBroker.it  This test is no t yet approved or cleared by the [place] FDA and  has been authorized for detection and/or diagnosis of SARS-CoV-2 by FDA under an Emergency Use Authorization (EUA). This EUA will remain  in effect (meaning this test can be used) for the duration of the COVID-19 declaration under Section 564(b)(1) of the Act,  21 U.S.C.section 360bbb-3(b)(1), unless the authorization is terminated  or revoked sooner.       Influenza A by PCR NEGATIVE NEGATIVE   Influenza B by PCR NEGATIVE NEGATIVE    Comment: (NOTE) The Xpert Xpress SARS-CoV-2/FLU/RSV plus assay is intended as an aid in the diagnosis of influenza from Nasopharyngeal swab specimens and should not be used as a sole basis for treatment. Nasal washings and aspirates are unacceptable for Xpert Xpress SARS-CoV-2/FLU/RSV testing.  Fact Sheet for Patients: BloggerCourse.com  Fact Sheet for Healthcare Providers: SeriousBroker.it  This test is not yet approved or cleared by the [place] FDA and has been authorized for detection and/or diagnosis of SARS-CoV-2 by FDA under an Emergency Use Authorization (EUA). This EUA will remain in effect (meaning this test can be used) for the duration of the COVID-19 declaration under Section 564(b)(1) of the Act, 21 U.S.C. section 360bbb-3(b)(1), unless the authorization is terminated or revoked.  Performed at [facility], [address] [place], [place] [address]    @[email or web address] @  Blood pressure (!) 144/88, pulse 89, temperature 98.1 F (36.7 C), temperature source Oral, resp. rate 20, height 5\' 6"  (1.676 m), weight (!) 163.3 kg, SpO2 94 %.   Assessment/Plan Problem  Diverticulitis of Intestine With Abscess  Anemia  Hypothyroid  Essential Hypertension  Pcos (Polycystic Ovarian Syndrome)   Anemia Currently the patient's hemoglobin is 14.8. Monitor.  Diverticulitis of large intestine with abscess This is a chronic and recurrent issue for this patient. She would benefit from excision of section of colon with recurrent diverticulitis, but this will have to wait for the patient to recover from the current illness. General surgery was consulted. I have discussed the patient with Dr. . He states that currently the patient's  abscess, although increased in size, remains to small for percutaneous drainage. Furthermore, given the patient's body habitus there is high risk of the complication in having the drain being able to reach the skin. He does not recommend an open procedure at this time due to acute infection and again risk of serious complication. He recommends treatment with IV antibiotics and monitoring. As the abscess appears to be sitting between the sigmoid colon and the bladder, her feels that a fistula is likely to form allowing for a natural drainage of the abscess. He does feel that she will need to have a partial colon resection at some time in the future as she has had  multiple recurrences of the problem.  She is receiving Zosyn IV. Blood cultures are pending.  Essential hypertension The patient is, with a couple of exceptions normotensive since admission. Will order hydralazine prn and monitor.  Hypothyroid The patient has been continued on synthroid at 88 mcg daily as at home.   Obesity, morbid, BMI 50 or higher (HCC) Super morbid obesity severely complicates all aspects of care and raises the risk for many possible modalities of treatment for her abscess. Once she is recovered she is strongly encouraged to work with her PCP to seek sensible weight loss through diet modification and increase in physical activity. Bariatric surgery would likely really be a benefit for this young woman.  PCOS (polycystic ovarian syndrome) Noted. Continue progesterone. This patient would benefit from metformin.  I have seen and examined this patient myself. I have spent 78 minutes in her evaluation and care.  DVT prophylaxis: Lovenox CODE STATUS: Full code Family Communication: None available From home Disposition: To home/may need long term IV antibiotics.   Dinnis Rog 09/12/2021, 9:10 AM

## 2021-09-12 NOTE — Assessment & Plan Note (Signed)
Currently the patient's hemoglobin is 14.8. Monitor.

## 2021-09-12 NOTE — Plan of Care (Signed)
Pt aox4, pleasant and cooperative with staff. Up adlib in room.  Diverticulitis, NPO status. Continue IV Abx per orders.  Gen Surgery following.   Problem: Clinical Measurements: Goal: Ability to maintain clinical measurements within normal limits will improve Outcome: Progressing   Problem: Clinical Measurements: Goal: Respiratory complications will improve Outcome: Progressing   Problem: Coping: Goal: Level of anxiety will decrease Outcome: Progressing   Problem: Pain Managment: Goal: General experience of comfort will improve Outcome: Progressing   Problem: Safety: Goal: Ability to remain free from injury will improve Outcome: Progressing

## 2021-09-12 NOTE — Assessment & Plan Note (Addendum)
The patient is, with a couple of exceptions normotensive since admission. Will order hydralazine prn and monitor.

## 2021-09-12 NOTE — Assessment & Plan Note (Addendum)
Noted. Continue progesterone. This patient would benefit from metformin.

## 2021-09-12 NOTE — Assessment & Plan Note (Signed)
Super morbid obesity severely complicates all aspects of care and raises the risk for many possible modalities of treatment for her abscess. Once she is recovered she is strongly encouraged to work with her PCP to seek sensible weight loss through diet modification and increase in physical activity. Bariatric surgery would likely really be a benefit for this young woman.

## 2021-09-12 NOTE — Progress Notes (Addendum)
Subjective: Not much better today.  Taking in some liquids.  ROS: See above, otherwise other systems negative  Objective: Vital signs in last 24 hours: Temp:  [97.5 F (36.4 C)-98.4 F (36.9 C)] 98.1 F (36.7 C) (12/01 0426) Pulse Rate:  [80-90] 89 (12/01 0426) Resp:  [18-20] 20 (12/01 0426) BP: (117-144)/(58-100) 144/88 (12/01 0426) SpO2:  [94 %-98 %] 94 % (12/01 0426) Last BM Date: 09/04/21  Intake/Output from previous day: 11/30 0701 - 12/01 0700 In: 119.7 [I.V.:19.7; IV Piggyback:100] Out: -  Intake/Output this shift: No intake/output data recorded.  PE: Heart: regular Lungs: CTAB Abd: soft, still tender across lower abdomen and some in upper mid abdomen.  No guarding or peritonitis noted.  Lab Results:  Recent Labs    09/11/21 0400  WBC 5.4  HGB 14.8  HCT 45.4  PLT 203   BMET Recent Labs    09/11/21 0400  NA 139  K 3.5  CL 105  CO2 24  GLUCOSE 113*  BUN 9  CREATININE 0.75  CALCIUM 9.5   PT/INR No results for input(s): LABPROT, INR in the last 72 hours. CMP     Component Value Date/Time   NA 139 09/11/2021 0400   NA 141 12/09/2016 1113   K 3.5 09/11/2021 0400   CL 105 09/11/2021 0400   CO2 24 09/11/2021 0400   GLUCOSE 113 (H) 09/11/2021 0400   BUN 9 09/11/2021 0400   BUN 10 12/09/2016 1113   CREATININE 0.75 09/11/2021 0400   CREATININE 0.84 10/20/2016 1205   CALCIUM 9.5 09/11/2021 0400   PROT 7.7 09/11/2021 0400   PROT 7.8 11/12/2016 1042   ALBUMIN 4.1 09/11/2021 0400   ALBUMIN 4.0 11/12/2016 1042   AST 16 09/11/2021 0400   ALT 17 09/11/2021 0400   ALKPHOS 64 09/11/2021 0400   BILITOT 0.7 09/11/2021 0400   BILITOT 0.6 11/12/2016 1042   GFRNONAA >60 09/11/2021 0400   GFRNONAA >89 10/20/2016 1205   GFRAA >60 03/31/2020 0543   GFRAA >89 10/20/2016 1205   Lipase     Component Value Date/Time   LIPASE 42 09/11/2021 0400       Studies/Results: CT ABDOMEN PELVIS W CONTRAST  Result Date: 09/11/2021 CLINICAL DATA:   Left lower quadrant abdominal pain. EXAM: CT ABDOMEN AND PELVIS WITH CONTRAST TECHNIQUE: Multidetector CT imaging of the abdomen and pelvis was performed using the standard protocol following bolus administration of intravenous contrast. CONTRAST:  OMNIPAQUE IOHEXOL 300 MG/ML  SOLN COMPARISON:  08/28/2021 FINDINGS: Lower chest: Unremarkable Hepatobiliary: No suspicious focal abnormality within the liver parenchyma. There is no evidence for gallstones, gallbladder wall thickening, or pericholecystic fluid. No intrahepatic or extrahepatic biliary dilation. Pancreas: No focal mass lesion. No dilatation of the main duct. No intraparenchymal cyst. No peripancreatic edema. Spleen: No splenomegaly. No focal mass lesion. Adrenals/Urinary Tract: No adrenal nodule or mass. Kidneys unremarkable. No evidence for hydroureter. As noted previously, there is focal bladder wall thickening towards the anterior dome, adjacent to the sigmoid colon. Stomach/Bowel: Stomach is unremarkable. No gastric wall thickening. No evidence of outlet obstruction. Duodenum is normally positioned as is the ligament of Treitz. No small bowel wall thickening. No small bowel dilatation. The terminal ileum is normal. Wall thickening and pericolonic edema/inflammation associated with the mid sigmoid colon is progressive since 08/28/2021. Extraluminal gas is now seen in the pericolonic fat cranial to the sigmoid colon (coronal image 84/series 5). Extraluminal gas dissects from the inferior wall the sigmoid colon into the  wall of the bladder dome where there is a 2.7 x 1.7 cm collection of gas and fluid in focal area of marked bladder wall thickening (well demonstrated coronal image 78/5 and sagittal image 79 of series 6). Vascular/Lymphatic: No abdominal aortic aneurysm mild left para-aortic lymphadenopathy (image 40/2) is progressive in the interval with mild lymphadenopathy in the left common iliac chain. Reproductive: Unremarkable. Other: Small  volume intraperitoneal free fluid. Musculoskeletal: No worrisome lytic or sclerotic osseous abnormality. Degenerative changes noted L5-S1 disc. IMPRESSION: 1. Interval progression of wall thickening and pericolonic edema/inflammation associated with the mid sigmoid colon since 08/28/2021. New extraluminal gas is now seen in the pericolonic fat consistent with micro perforation. There is also evidence for dissection of inflammatory process from the inferior wall of the sigmoid colon into the bladder dome where there is a 2.7 x 1.7 cm intramural collection of gas and fluid suggesting bladder wall abscess. 2. Interval development of mild left para-aortic and left common iliac chain lymphadenopathy, likely reactive. 3. Small volume intraperitoneal free fluid. Electronically Signed   By: [name] M.D.   On: 09/11/2021 06:09    Anti-infectives: Anti-infectives (From admission, onward)    Start     Dose/Rate Route Frequency Ordered Stop   09/11/21 1600  piperacillin-tazobactam (ZOSYN) IVPB 3.375 g        3.375 g 12.5 mL/hr over 240 Minutes Intravenous Every 8 hours 09/11/21 1538     09/11/21 0645  piperacillin-tazobactam (ZOSYN) IVPB 3.375 g        3.375 g 100 mL/hr over 30 Minutes Intravenous  Once 09/11/21 6599 09/11/21 0729        Assessment/Plan  Recurrent diverticulitis -cont sips of clears and IV abx therapy -cont conservative management at this time. -continue to follow closely  FEN - NPO with sips, IVFs VTE - Lovenox ID - zosyn   LOS: 1 day    Lori Mcknight , [facility] 09/12/2021, 10:27 AM Please see Amion for pager number during day hours 7:00am-4:30pm or 7:00am -11:30am on weekends

## 2021-09-12 NOTE — Assessment & Plan Note (Signed)
This is a chronic and recurrent issue for this patient. She would benefit from excision of section of colon with recurrent diverticulitis, but this will have to wait for the patient to recover from the current illness. General surgery was consulted. I have discussed the patient with Dr. Cliffton Asters. He states that currently the patient's abscess, although increased in size, remains to small for percutaneous drainage. Furthermore, given the patient's body habitus there is high risk of the complication in having the drain being able to reach the skin. He does not recommend an open procedure at this time due to acute infection and again risk of serious complication. He recommends treatment with IV antibiotics and monitoring. As the abscess appears to be sitting between the sigmoid colon and the bladder, her feels that a fistula is likely to form allowing for a natural drainage of the abscess. He does feel that she will need to have a partial colon resection at some time in the future as she has had multiple recurrences of the problem.  She is receiving Zosyn IV. Blood cultures are pending.

## 2021-09-12 NOTE — Progress Notes (Signed)
PHARMACY - PHYSICIAN COMMUNICATION CRITICAL VALUE ALERT - BLOOD CULTURE IDENTIFICATION (BCID)  Lori Mcknight is an 35 y.o. female who presented to [facility] on 09/11/2021 with a chief complaint of recurrent diverticulitis  Assessment:  Bcx from 11/30 drawn @ 0642 with 2/2 bottles growing GPC on culture; BCID with staphylococcus epidermidis - no resistance detected  Name of physician (or Provider) Contacted: Dr. Blake Divine  Current antibiotics: Zosyn 3.375g IV q 8 hours  Changes to prescribed antibiotics recommended:  Patient is on recommended antibiotics - No changes needed  Results for orders placed or performed during the hospital encounter of 09/11/21  Blood Culture ID Panel (Reflexed) (Collected: 09/11/2021  6:42 AM)  Result Value Ref Range   Enterococcus faecalis NOT DETECTED NOT DETECTED   Enterococcus Faecium NOT DETECTED NOT DETECTED   Listeria monocytogenes NOT DETECTED NOT DETECTED   Staphylococcus species DETECTED (A) NOT DETECTED   Staphylococcus aureus (BCID) NOT DETECTED NOT DETECTED   Staphylococcus epidermidis DETECTED (A) NOT DETECTED   Staphylococcus lugdunensis NOT DETECTED NOT DETECTED   Streptococcus species NOT DETECTED NOT DETECTED   Streptococcus agalactiae NOT DETECTED NOT DETECTED   Streptococcus pneumoniae NOT DETECTED NOT DETECTED   Streptococcus pyogenes NOT DETECTED NOT DETECTED   A.calcoaceticus-baumannii NOT DETECTED NOT DETECTED   Bacteroides fragilis NOT DETECTED NOT DETECTED   Enterobacterales NOT DETECTED NOT DETECTED   Enterobacter cloacae complex NOT DETECTED NOT DETECTED   Escherichia coli NOT DETECTED NOT DETECTED   Klebsiella aerogenes NOT DETECTED NOT DETECTED   Klebsiella oxytoca NOT DETECTED NOT DETECTED   Klebsiella pneumoniae NOT DETECTED NOT DETECTED   Proteus species NOT DETECTED NOT DETECTED   Salmonella species NOT DETECTED NOT DETECTED   Serratia marcescens NOT DETECTED NOT DETECTED   Haemophilus influenzae NOT DETECTED NOT  DETECTED   Neisseria meningitidis NOT DETECTED NOT DETECTED   Pseudomonas aeruginosa NOT DETECTED NOT DETECTED   Stenotrophomonas maltophilia NOT DETECTED NOT DETECTED   Candida albicans NOT DETECTED NOT DETECTED   Candida auris NOT DETECTED NOT DETECTED   Candida glabrata NOT DETECTED NOT DETECTED   Candida krusei NOT DETECTED NOT DETECTED   Candida parapsilosis NOT DETECTED NOT DETECTED   Candida tropicalis NOT DETECTED NOT DETECTED   Cryptococcus neoformans/gattii NOT DETECTED NOT DETECTED   Methicillin resistance mecA/C NOT DETECTED NOT DETECTED    Rexford Maus, PharmD 09/12/2021 12:24 PM

## 2021-09-13 LAB — BASIC METABOLIC PANEL
Anion gap: 10 (ref 5–15)
BUN: 8 mg/dL (ref 6–20)
CO2: 22 mmol/L (ref 22–32)
Calcium: 8.4 mg/dL — ABNORMAL LOW (ref 8.9–10.3)
Chloride: 101 mmol/L (ref 98–111)
Creatinine, Ser: 0.77 mg/dL (ref 0.44–1.00)
GFR, Estimated: >60 mL/min (ref >60)
Glucose, Bld: 91 mg/dL (ref 70–99)
Potassium: 3.4 mmol/L — ABNORMAL LOW (ref 3.5–5.1)
Sodium: 133 mmol/L — ABNORMAL LOW (ref 135–145)

## 2021-09-13 LAB — CBC
HCT: 43.3 % (ref 36.0–46.0)
Hemoglobin: 14.7 g/dL (ref 12.0–15.0)
MCH: 32.3 pg (ref 26.0–34.0)
MCHC: 33.9 g/dL (ref 30.0–36.0)
MCV: 95.2 fL (ref 80.0–100.0)
Platelets: 154 10*3/uL (ref 150–400)
RBC: 4.55 MIL/uL (ref 3.87–5.11)
RDW: 12.2 % (ref 11.5–15.5)
WBC: 10.3 10*3/uL (ref 4.0–10.5)
nRBC: 0 % (ref 0.0–0.2)

## 2021-09-13 MED ORDER — POTASSIUM CHLORIDE CRYS ER 20 MEQ PO TBCR
40.0000 meq | EXTENDED_RELEASE_TABLET | Freq: Once | ORAL | Status: AC
  Administered 2021-09-13: 40 meq via ORAL
  Filled 2021-09-13: qty 2

## 2021-09-13 MED ORDER — MORPHINE SULFATE (PF) 2 MG/ML IV SOLN
2.0000 mg | INTRAVENOUS | Status: DC | PRN
Start: 2021-09-13 — End: 2021-09-14
  Administered 2021-09-13 (×3): 4 mg via INTRAVENOUS
  Administered 2021-09-14 (×2): 2 mg via INTRAVENOUS
  Administered 2021-09-14 (×2): 4 mg via INTRAVENOUS
  Filled 2021-09-13 (×2): qty 1
  Filled 2021-09-13 (×5): qty 2

## 2021-09-13 MED ORDER — SODIUM CHLORIDE 0.9 % IV SOLN: INTRAVENOUS | Status: DC

## 2021-09-13 NOTE — Progress Notes (Signed)
Pharmacy Antibiotic Note  Lori Mcknight is a 35 y.o. female admitted on 09/11/2021 with diverticulitis and small associated abscess.  Pharmacy has been consulted for Zosyn dosing.  Plan: Continue Zosyn 3.375g IV Q8H infused over 4hrs. Follow up renal function, culture results, and clinical course. Given that creatinine is stable and dose adjustments are unlikely, pharmacy will officially sign off of consult and continue to monitor peripherally.   Height: 5\' 6"  (167.6 cm) Weight: (!) 163.3 kg (360 lb) IBW/kg (Calculated) : 59.3  Temp (24hrs), Avg:98.6 F (37 C), Min:98.3 F (36.8 C), Max:98.9 F (37.2 C)  Recent Labs  Lab 09/11/21 0400 09/13/21 0539  WBC 5.4 10.3  CREATININE 0.75 0.77     Estimated Creatinine Clearance: 154.9 mL/min (by C-G formula based on SCr of 0.77 mg/dL).    Allergies  Allergen Reactions   Dilaudid [Hydromorphone] Rash    Itching all over body    Antimicrobials this admission: 11/30 Zosyn >>   Dose adjustments this admission:   Microbiology results: 11/30 BCx: 2/2 MSSE 11/30 Bcx: 1/2 MSSE  Thank you for allowing pharmacy to be a part of this patient's care.  12/30, PharmD 09/13/2021 7:35 AM

## 2021-09-13 NOTE — Progress Notes (Signed)
Patient ID: Lori Mcknight, female   DOB: 03-06-1986, 35 y.o.   MRN: [id number] [facility] Surgery Progress Note     Subjective: CC-  Patient reports persistent abdominal pain but it is somewhat improved since admission. 10/10 on admission, 6/10 today. Pain is diffuse but most severe in the lower abdomen. Denies n/v. WBC 10.3, afebrile  Objective: Vital signs in last 24 hours: Temp:  [98.3 F (36.8 C)-98.9 F (37.2 C)] 98.9 F (37.2 C) (12/02 0456) Pulse Rate:  [84-96] 88 (12/02 0456) Resp:  [16-20] 16 (12/02 0456) BP: (115-132)/(71-86) 115/71 (12/02 0456) SpO2:  [95 %-99 %] 99 % (12/02 0456) Last BM Date: 09/04/21  Intake/Output from previous day: 12/01 0701 - 12/02 0700 In: 50 [IV Piggyback:50] Out: 600 [Urine:600] Intake/Output this shift: No intake/output data recorded.  PE: Gen:  Alert, NAD, pleasant Pulm:  rate and effort normal Abd: obese, soft, mild diffuse tenderness with more moderate tenderness across the lower abdomen L>R Psych: A&Ox3 Skin: no rashes noted, warm and dry  Lab Results:  Recent Labs    09/11/21 0400 09/13/21 0539  WBC 5.4 10.3  HGB 14.8 14.7  HCT 45.4 43.3  PLT 203 154   BMET Recent Labs    09/11/21 0400 09/13/21 0539  NA 139 133*  K 3.5 3.4*  CL 105 101  CO2 24 22  GLUCOSE 113* 91  BUN 9 8  CREATININE 0.75 0.77  CALCIUM 9.5 8.4*   PT/INR No results for input(s): LABPROT, INR in the last 72 hours. CMP     Component Value Date/Time   NA 133 (L) 09/13/2021 0539   NA 141 12/09/2016 1113   K 3.4 (L) 09/13/2021 0539   CL 101 09/13/2021 0539   CO2 22 09/13/2021 0539   GLUCOSE 91 09/13/2021 0539   BUN 8 09/13/2021 0539   BUN 10 12/09/2016 1113   CREATININE 0.77 09/13/2021 0539   CREATININE 0.84 10/20/2016 1205   CALCIUM 8.4 (L) 09/13/2021 0539   PROT 7.7 09/11/2021 0400   PROT 7.8 11/12/2016 1042   ALBUMIN 4.1 09/11/2021 0400   ALBUMIN 4.0 11/12/2016 1042   AST 16 09/11/2021 0400   ALT 17 09/11/2021 0400   ALKPHOS  64 09/11/2021 0400   BILITOT 0.7 09/11/2021 0400   BILITOT 0.6 11/12/2016 1042   GFRNONAA >60 09/13/2021 0539   GFRNONAA >89 10/20/2016 1205   GFRAA >60 03/31/2020 0543   GFRAA >89 10/20/2016 1205   Lipase     Component Value Date/Time   LIPASE 42 09/11/2021 0400       Studies/Results: No results found.  Anti-infectives: Anti-infectives (From admission, onward)    Start     Dose/Rate Route Frequency Ordered Stop   09/11/21 1600  piperacillin-tazobactam (ZOSYN) IVPB 3.375 g        3.375 g 12.5 mL/hr over 240 Minutes Intravenous Every 8 hours 09/11/21 1538     09/11/21 0645  piperacillin-tazobactam (ZOSYN) IVPB 3.375 g        3.375 g 100 mL/hr over 30 Minutes Intravenous  Once 09/11/21 0634 09/11/21 0729        Assessment/Plan Recurrent diverticulitis - still tender but pain improving, WBC WNL - ok for clear liquids - continue IV antibiotics - cont conservative management at this time.   FEN - IVFs, CLD VTE - Lovenox ID - zosyn 11/30>>   LOS: 2 days    Franne Forts, [facility] Surgery 09/13/2021, 9:47 AM Please see Amion for pager number during day hours  7:00am-4:30pm

## 2021-09-13 NOTE — Progress Notes (Signed)
PHARMACY - PHYSICIAN COMMUNICATION CRITICAL VALUE ALERT - BLOOD CULTURE IDENTIFICATION (BCID)  Lori Mcknight is an 35 y.o. female who presented to [facility] on 09/11/2021 with a chief complaint of recurrent diverticulitis.  Assessment:  Bcx drawn 11/30 PM with 1/2 bottles growing GPC on culture; BCID with Staph epi - no resistance detected   Name of physician (or Provider) Contacted: Dr. Blake Divine  Current antibiotics: Zosyn 3.375g IV q8 hours  Changes to prescribed antibiotics recommended:  Patient is on recommended antibiotics - No changes needed  Results for orders placed or performed during the hospital encounter of 09/11/21  Blood Culture ID Panel (Reflexed) (Collected: 09/11/2021  6:42 AM)  Result Value Ref Range   Enterococcus faecalis NOT DETECTED NOT DETECTED   Enterococcus Faecium NOT DETECTED NOT DETECTED   Listeria monocytogenes NOT DETECTED NOT DETECTED   Staphylococcus species DETECTED (A) NOT DETECTED   Staphylococcus aureus (BCID) NOT DETECTED NOT DETECTED   Staphylococcus epidermidis DETECTED (A) NOT DETECTED   Staphylococcus lugdunensis NOT DETECTED NOT DETECTED   Streptococcus species NOT DETECTED NOT DETECTED   Streptococcus agalactiae NOT DETECTED NOT DETECTED   Streptococcus pneumoniae NOT DETECTED NOT DETECTED   Streptococcus pyogenes NOT DETECTED NOT DETECTED   A.calcoaceticus-baumannii NOT DETECTED NOT DETECTED   Bacteroides fragilis NOT DETECTED NOT DETECTED   Enterobacterales NOT DETECTED NOT DETECTED   Enterobacter cloacae complex NOT DETECTED NOT DETECTED   Escherichia coli NOT DETECTED NOT DETECTED   Klebsiella aerogenes NOT DETECTED NOT DETECTED   Klebsiella oxytoca NOT DETECTED NOT DETECTED   Klebsiella pneumoniae NOT DETECTED NOT DETECTED   Proteus species NOT DETECTED NOT DETECTED   Salmonella species NOT DETECTED NOT DETECTED   Serratia marcescens NOT DETECTED NOT DETECTED   Haemophilus influenzae NOT DETECTED NOT DETECTED   Neisseria  meningitidis NOT DETECTED NOT DETECTED   Pseudomonas aeruginosa NOT DETECTED NOT DETECTED   Stenotrophomonas maltophilia NOT DETECTED NOT DETECTED   Candida albicans NOT DETECTED NOT DETECTED   Candida auris NOT DETECTED NOT DETECTED   Candida glabrata NOT DETECTED NOT DETECTED   Candida krusei NOT DETECTED NOT DETECTED   Candida parapsilosis NOT DETECTED NOT DETECTED   Candida tropicalis NOT DETECTED NOT DETECTED   Cryptococcus neoformans/gattii NOT DETECTED NOT DETECTED   Methicillin resistance mecA/C NOT DETECTED NOT DETECTED   Rexford Maus, PharmD 09/13/2021 7:30 AM

## 2021-09-13 NOTE — Progress Notes (Signed)
PROGRESS NOTE    Lori Mcknight  ZJQ:[id number] DOB: 04-22-1986 DOA: 09/11/2021 PCP: Claiborne Rigg, NP    Chief Complaint  Patient presents with   Abdominal Pain    Brief Narrative:   The patient is a 35 yr old woman who has had multiple recurrences of diverticulitis with abscess formation, depression anxiety, anemia, hypertension, hypothyroidism, PCOS, super morbid obesity presents this time with severe abdominal pain.  CT of the abdomen and pelvis demonstrated increase in the size of the abscess.  She was started on IV Zosyn and general surgery consulted. General surgery recommended IV antibiotics and clear sips.   Assessment & Plan:   Principal Problem:   Diverticulitis of large intestine with abscess Active Problems:   PCOS (polycystic ovarian syndrome)   Essential hypertension   Hypothyroid   Anemia   Obesity, morbid, BMI 50 or higher (HCC)   Acute diverticulitis of the large intestine with abscess formation This is a chronic and recurrent issue for the patient Dr Gerri Lins has discussed with Dr. Cliffton Asters suggested to the abscess has increased in size  but it remains too small for percutaneous drainage.  Furthermore given the patient's body habitus there is high risk for complications and having the drain being able to reach the skin.  Does not recommend an open procedure at this time due to acute infection and status complications.  General surgery recommends treatment with IV antibiotics and watchful observation.  As the abscess appears to be sitting between the sigmoid colon and the bladder there is likelihood of fistula formation allowing for natural drainage of the abscess.  If she continues to worsen she will probably end up with a partial colon resection sometime in the future. Patient currently is on IV Zosyn continue the same,  continue IV fluids.  Blood cultures were positive for staph epidermidis ? Contamination vs infection.  Advance diet to clears today.  Pain  control.    Staph epidermidis bacteremia Continue with IV Zosyn for now. Will get echocardiogram.    Morbid obesity Body mass index is 58.11 kg/m. Increased risk of mortality and morbidity.    Essential hypertension -Blood pressure parameters are optimal.     Hypothyroidism Continue with Synthroid.   Anxiety and depression Continue with home medications.   History of PCOS Patient will probably need to be on metformin on discharge.   Chest tightness yesterday.  EKG NSR.  Continue to monitor.    Hypokalemia:  Replaced.   DVT prophylaxis: (Lovenox/) Code Status: (Full code) Family Communication: no family at bedside.  Disposition:   Status is: Inpatient  Remains inpatient appropriate because: IV antibiotics.        Consultants:  Surgery.   Procedures: none.   Antimicrobials:  Antibiotics Given (last 72 hours)     Date/Time Action Medication Dose Rate   09/11/21 0659 New Bag/Given   piperacillin-tazobactam (ZOSYN) IVPB 3.375 g 3.375 g 100 mL/hr   09/11/21 1554 New Bag/Given   piperacillin-tazobactam (ZOSYN) IVPB 3.375 g 3.375 g 12.5 mL/hr   09/11/21 2341 New Bag/Given   piperacillin-tazobactam (ZOSYN) IVPB 3.375 g 3.375 g 12.5 mL/hr   09/12/21 0752 New Bag/Given   piperacillin-tazobactam (ZOSYN) IVPB 3.375 g 3.375 g 12.5 mL/hr   09/12/21 1537 New Bag/Given   piperacillin-tazobactam (ZOSYN) IVPB 3.375 g 3.375 g 12.5 mL/hr   09/12/21 2327 New Bag/Given   piperacillin-tazobactam (ZOSYN) IVPB 3.375 g 3.375 g 12.5 mL/hr   09/13/21 0856 New Bag/Given   piperacillin-tazobactam (ZOSYN) IVPB 3.375 g 3.375 g  12.5 mL/hr         Subjective:   Objective: Vitals:   09/12/21 1400 09/12/21 2000 09/13/21 0456 09/13/21 1218  BP: 132/86 130/72 115/71 119/80  Pulse: 96 84 88 90  Resp: 20 18 16 17   Temp: 98.3 F (36.8 C) 98.6 F (37 C) 98.9 F (37.2 C) 98.2 F (36.8 C)  TempSrc: Oral Oral Oral Oral  SpO2: 95% 98% 99% 99%  Weight:      Height:         Intake/Output Summary (Last 24 hours) at 09/13/2021 1417 Last data filed at 09/13/2021 1219 Gross per 24 hour  Intake 168 ml  Output 600 ml  Net -432 ml    Filed Weights   09/11/21 0303  Weight: (!) 163.3 kg    Examination:   General exam: Appears calm and comfortable  Respiratory system: Clear to auscultation. Respiratory effort normal. Cardiovascular system: S1 & S2 heard, RRR. No JVD,No pedal edema. Gastrointestinal system: Abdomen is soft, tender in the lower quadrant. Non distended.  Normal bowel sounds heard. Central nervous system: Alert and oriented. No focal neurological deficits. Extremities: Symmetric 5 x 5 power. Skin: No rashes, lesions or ulcers Psychiatry:  Mood & affect appropriate.      Data Reviewed: I have personally reviewed following labs and imaging studies  CBC: Recent Labs  Lab 09/11/21 0400 09/13/21 0539  WBC 5.4 10.3  NEUTROABS 3.3  --   HGB 14.8 14.7  HCT 45.4 43.3  MCV 95.8 95.2  PLT 203 154     Basic Metabolic Panel: Recent Labs  Lab 09/11/21 0400 09/13/21 0539  NA 139 133*  K 3.5 3.4*  CL 105 101  CO2 24 22  GLUCOSE 113* 91  BUN 9 8  CREATININE 0.75 0.77  CALCIUM 9.5 8.4*     GFR: Estimated Creatinine Clearance: 154.9 mL/min (by C-G formula based on SCr of 0.77 mg/dL).  Liver Function Tests: Recent Labs  Lab 09/11/21 0400  AST 16  ALT 17  ALKPHOS 64  BILITOT 0.7  PROT 7.7  ALBUMIN 4.1     CBG: No results for input(s): GLUCAP in the last 168 hours.   Recent Results (from the past 240 hour(s))  Blood culture (routine x 2)     Status: Abnormal (Preliminary result)   Collection Time: 09/11/21  6:42 AM   Specimen: BLOOD  Result Value Ref Range Status   Specimen Description   Final    BLOOD BLOOD LEFT FOREARM Performed at [facility], [address] [place], [place]    Special Requests   Final    Blood Culture adequate volume BOTTLES DRAWN AEROBIC AND  ANAEROBIC Performed at [facility], [address], [place], [place]    Culture  Setup Time   Final    GRAM POSITIVE COCCI IN BOTH AEROBIC AND ANAEROBIC BOTTLES CRITICAL RESULT CALLED TO, READ BACK BY AND VERIFIED WITH: PHARMD K.SHADE AT 1213 ON 09/12/2021 BY T.SAAD.    Culture (A)  Final    STAPHYLOCOCCUS EPIDERMIDIS THE SIGNIFICANCE OF ISOLATING THIS ORGANISM FROM A SINGLE VENIPUNCTURE CANNOT BE PREDICTED WITHOUT FURTHER CLINICAL AND CULTURE CORRELATION. SUSCEPTIBILITIES AVAILABLE ONLY ON REQUEST. Performed at [facility], [address], [place], [place]    Report Status PENDING  Incomplete  Blood Culture ID Panel (Reflexed)     Status: Abnormal   Collection Time: 09/11/21  6:42 AM  Result Value Ref Range Status   Enterococcus faecalis NOT DETECTED  NOT DETECTED Final   Enterococcus Faecium NOT DETECTED NOT DETECTED Final   Listeria monocytogenes NOT DETECTED NOT DETECTED Final   Staphylococcus species DETECTED (A) NOT DETECTED Final    Comment: CRITICAL RESULT CALLED TO, READ BACK BY AND VERIFIED WITH: PHARMD K.SHADE AT 1213 ON 09/12/2021 BY T.SAAD.    Staphylococcus aureus (BCID) NOT DETECTED NOT DETECTED Final   Staphylococcus epidermidis DETECTED (A) NOT DETECTED Final    Comment: CRITICAL RESULT CALLED TO, READ BACK BY AND VERIFIED WITH: PHARMD K.SHADE AT 1213 ON 09/12/2021 BY T.SAAD.    Staphylococcus lugdunensis NOT DETECTED NOT DETECTED Final   Streptococcus species NOT DETECTED NOT DETECTED Final   Streptococcus agalactiae NOT DETECTED NOT DETECTED Final   Streptococcus pneumoniae NOT DETECTED NOT DETECTED Final   Streptococcus pyogenes NOT DETECTED NOT DETECTED Final   A.calcoaceticus-baumannii NOT DETECTED NOT DETECTED Final   Bacteroides fragilis NOT DETECTED NOT DETECTED Final   Enterobacterales NOT DETECTED NOT DETECTED Final   Enterobacter cloacae complex NOT DETECTED NOT DETECTED Final   Escherichia coli NOT  DETECTED NOT DETECTED Final   Klebsiella aerogenes NOT DETECTED NOT DETECTED Final   Klebsiella oxytoca NOT DETECTED NOT DETECTED Final   Klebsiella pneumoniae NOT DETECTED NOT DETECTED Final   Proteus species NOT DETECTED NOT DETECTED Final   Salmonella species NOT DETECTED NOT DETECTED Final   Serratia marcescens NOT DETECTED NOT DETECTED Final   Haemophilus influenzae NOT DETECTED NOT DETECTED Final   Neisseria meningitidis NOT DETECTED NOT DETECTED Final   Pseudomonas aeruginosa NOT DETECTED NOT DETECTED Final   Stenotrophomonas maltophilia NOT DETECTED NOT DETECTED Final   Candida albicans NOT DETECTED NOT DETECTED Final   Candida auris NOT DETECTED NOT DETECTED Final   Candida glabrata NOT DETECTED NOT DETECTED Final   Candida krusei NOT DETECTED NOT DETECTED Final   Candida parapsilosis NOT DETECTED NOT DETECTED Final   Candida tropicalis NOT DETECTED NOT DETECTED Final   Cryptococcus neoformans/gattii NOT DETECTED NOT DETECTED Final   Methicillin resistance mecA/C NOT DETECTED NOT DETECTED Final    Comment: Performed at [facility], [address], [place], [place] [address]  Resp Panel by RT-PCR (Flu A&B, Covid) Nasopharyngeal Swab     Status: None   Collection Time: 09/11/21 10:45 AM   Specimen: Nasopharyngeal Swab; Nasopharyngeal(NP) swabs in vial transport medium  Result Value Ref Range Status   SARS Coronavirus 2 by RT PCR NEGATIVE NEGATIVE Final    Comment: (NOTE) SARS-CoV-2 target nucleic acids are NOT DETECTED.  The SARS-CoV-2 RNA is generally detectable in upper respiratory specimens during the acute phase of infection. The lowest concentration of SARS-CoV-2 viral copies this assay can detect is 138 copies/mL. A negative result does not preclude SARS-Cov-2 infection and should not be used as the sole basis for treatment or other patient management decisions. A negative result may occur with  improper specimen collection/handling, submission of specimen  other than nasopharyngeal swab, presence of viral mutation(s) within the areas targeted by this assay, and inadequate number of viral copies(<138 copies/mL). A negative result must be combined with clinical observations, patient history, and epidemiological information. The expected result is Negative.  Fact Sheet for Patients:  BloggerCourse.com  Fact Sheet for Healthcare Providers:  SeriousBroker.it  This test is no t yet approved or cleared by the [place] FDA and  has been authorized for detection and/or diagnosis of SARS-CoV-2 by FDA under an Emergency Use Authorization (EUA). This EUA will remain  in effect (meaning this test can be used)  for the duration of the COVID-19 declaration under Section 564(b)(1) of the Act, 21 U.S.C.section 360bbb-3(b)(1), unless the authorization is terminated  or revoked sooner.       Influenza A by PCR NEGATIVE NEGATIVE Final   Influenza B by PCR NEGATIVE NEGATIVE Final    Comment: (NOTE) The Xpert Xpress SARS-CoV-2/FLU/RSV plus assay is intended as an aid in the diagnosis of influenza from Nasopharyngeal swab specimens and should not be used as a sole basis for treatment. Nasal washings and aspirates are unacceptable for Xpert Xpress SARS-CoV-2/FLU/RSV testing.  Fact Sheet for Patients: BloggerCourse.com  Fact Sheet for Healthcare Providers: SeriousBroker.it  This test is not yet approved or cleared by the [place] FDA and has been authorized for detection and/or diagnosis of SARS-CoV-2 by FDA under an Emergency Use Authorization (EUA). This EUA will remain in effect (meaning this test can be used) for the duration of the COVID-19 declaration under Section 564(b)(1) of the Act, 21 U.S.C. section 360bbb-3(b)(1), unless the authorization is terminated or revoked.  Performed at [facility], [address] [place], [place] [address]   Blood culture (routine x 2)     Status: None (Preliminary result)   Collection Time: 09/11/21  3:52 PM   Specimen: BLOOD RIGHT HAND  Result Value Ref Range Status   Specimen Description   Final    BLOOD RIGHT HAND Performed at [facility], [address] [place], [place] [address]    Special Requests   Final    BOTTLES DRAWN AEROBIC AND ANAEROBIC Blood Culture adequate volume Performed at [facility], [address] [place], [place] [address]    Culture  Setup Time   Final    GRAM POSITIVE COCCI IN CLUSTERS ANAEROBIC BOTTLE ONLY CRITICAL RESULT CALLED TO, READ BACK BY AND VERIFIED WITH: Ellouise Newer, AT 9147 09/13/21 Renato Shin Performed at [facility], [address], [place], [place] [address]    Culture GRAM POSITIVE COCCI  Final   Report Status PENDING  Incomplete  Culture, blood (Routine X 2) w Reflex to ID Panel     Status: None (Preliminary result)   Collection Time: 09/11/21  5:14 PM   Specimen: BLOOD  Result Value Ref Range Status   Specimen Description   Final    BLOOD BLOOD LEFT HAND Performed at [facility], [address] [place], [place] [address]    Special Requests   Final    BOTTLES DRAWN AEROBIC ONLY Blood Culture results may not be optimal due to an inadequate volume of blood received in culture bottles Performed at [facility], [address], [place], [place] [address]    Culture   Final    NO GROWTH 1 DAY Performed at [facility], [address] [place], [place] [address]    Report Status PENDING  Incomplete          Radiology Studies: No results found.      Scheduled Meds:  enoxaparin (LOVENOX) injection  80 mg Subcutaneous Q12H   levothyroxine  88 mcg Oral QAC breakfast   medroxyPROGESTERone  10 mg Oral Daily   pantoprazole (PROTONIX) IV  40 mg Intravenous Q24H   Continuous Infusions:  sodium  chloride 10 mL/hr at 09/11/21 0658   sodium chloride 75 mL/hr at 09/13/21 0857   piperacillin-tazobactam (ZOSYN)  IV 3.375 g (09/13/21 0856)     LOS: 2 days        Edson Snowball  Blake Divine, MD [facility]   To contact the attending provider between 7A-7P or the covering provider during after hours 7P-7A, please log into the web site [email or web address] and access using universal [place] password for that web site. If you do not have the password, please call the hospital operator.  09/13/2021, 2:17 PM

## 2021-09-14 ENCOUNTER — Inpatient Hospital Stay (HOSPITAL_COMMUNITY): Payer: Self-pay

## 2021-09-14 ENCOUNTER — Encounter (HOSPITAL_COMMUNITY): Payer: Self-pay | Admitting: Internal Medicine

## 2021-09-14 DIAGNOSIS — R7881 Bacteremia: Secondary | ICD-10-CM

## 2021-09-14 LAB — CBC
HCT: 45.8 % (ref 36.0–46.0)
Hemoglobin: 15 g/dL (ref 12.0–15.0)
MCH: 31.6 pg (ref 26.0–34.0)
MCHC: 32.8 g/dL (ref 30.0–36.0)
MCV: 96.4 fL (ref 80.0–100.0)
Platelets: 186 10*3/uL (ref 150–400)
RBC: 4.75 MIL/uL (ref 3.87–5.11)
RDW: 12.3 % (ref 11.5–15.5)
WBC: 14.1 10*3/uL — ABNORMAL HIGH (ref 4.0–10.5)
nRBC: 0 % (ref 0.0–0.2)

## 2021-09-14 LAB — CULTURE, BLOOD (ROUTINE X 2): Special Requests: ADEQUATE

## 2021-09-14 LAB — ECHOCARDIOGRAM COMPLETE
Area-P 1/2: 5.75 cm2
Height: 66 in
S' Lateral: 2.6 cm
Weight: 5760 oz

## 2021-09-14 MED ORDER — IOHEXOL 350 MG/ML SOLN
100.0000 mL | Freq: Once | INTRAVENOUS | Status: AC | PRN
  Administered 2021-09-14: 100 mL via INTRAVENOUS

## 2021-09-14 MED ORDER — METHOCARBAMOL 1000 MG/10ML IJ SOLN
500.0000 mg | Freq: Four times a day (QID) | INTRAVENOUS | Status: DC | PRN
  Administered 2021-09-15 – 2021-09-19 (×7): 500 mg via INTRAVENOUS
  Filled 2021-09-14: qty 500
  Filled 2021-09-14: qty 5
  Filled 2021-09-14 (×6): qty 500

## 2021-09-14 MED ORDER — IOHEXOL 9 MG/ML PO SOLN
500.0000 mL | ORAL | Status: AC
  Administered 2021-09-14 (×2): 500 mL via ORAL

## 2021-09-14 MED ORDER — ACETAMINOPHEN 325 MG PO TABS
650.0000 mg | ORAL_TABLET | ORAL | Status: DC | PRN
  Administered 2021-09-15 – 2021-09-17 (×2): 650 mg via ORAL
  Filled 2021-09-14 (×2): qty 2

## 2021-09-14 MED ORDER — PERFLUTREN LIPID MICROSPHERE
1.0000 mL | INTRAVENOUS | Status: AC | PRN
Start: 2021-09-14 — End: 2021-09-14
  Administered 2021-09-14: 2 mL via INTRAVENOUS
  Filled 2021-09-14: qty 10

## 2021-09-14 MED ORDER — MORPHINE SULFATE (PF) 2 MG/ML IV SOLN
2.0000 mg | INTRAVENOUS | Status: DC | PRN
Start: 2021-09-14 — End: 2021-09-17
  Administered 2021-09-14: 4 mg via INTRAVENOUS
  Administered 2021-09-14 (×2): 2 mg via INTRAVENOUS
  Administered 2021-09-15 – 2021-09-16 (×10): 4 mg via INTRAVENOUS
  Administered 2021-09-17: 2 mg via INTRAVENOUS
  Administered 2021-09-17 (×2): 4 mg via INTRAVENOUS
  Filled 2021-09-14 (×4): qty 2
  Filled 2021-09-14: qty 1
  Filled 2021-09-14: qty 2
  Filled 2021-09-14: qty 1
  Filled 2021-09-14 (×3): qty 2
  Filled 2021-09-14: qty 1
  Filled 2021-09-14 (×5): qty 2

## 2021-09-14 NOTE — Progress Notes (Signed)
[facility] Progress Note     Subjective: CC-  Pt with worse pain LLQ.  Minimal nausea. No emesis.  Requiring more pain meds.  Pain worse with movement.    Objective: Vital signs in last 24 hours: Temp:  [97.6 F (36.4 C)-98.4 F (36.9 C)] 98.4 F (36.9 C) (12/03 0310) Pulse Rate:  [79-100] 91 (12/03 0310) Resp:  [17-20] 20 (12/03 0310) BP: (119-139)/(80-114) 136/80 (12/03 0310) SpO2:  [97 %-100 %] 97 % (12/03 0310) Last BM Date: 09/04/21  Intake/Output from previous day: 12/02 0701 - 12/03 0700 In: 1570 [P.O.:118; I.V.:1237.4; IV Piggyback:214.6] Out: -  Intake/Output this shift: Total I/O In: 517.6 [I.V.:482.2; IV Piggyback:35.4] Out: -   PE: Gen:  Alert, pleasant, looks uncomfortable Pulm:  rate and effort normal Abd: obese, soft, LLQ tenderness with voluntary guarding. Psych: A&Ox3 Skin: no rashes noted, warm and dry  Lab Results:  Recent Labs    09/13/21 0539 09/14/21 0748  WBC 10.3 14.1*  HGB 14.7 15.0  HCT 43.3 45.8  PLT 154 186   BMET Recent Labs    09/13/21 0539  NA 133*  K 3.4*  CL 101  CO2 22  GLUCOSE 91  BUN 8  CREATININE 0.77  CALCIUM 8.4*   PT/INR No results for input(s): LABPROT, INR in the last 72 hours. CMP     Component Value Date/Time   NA 133 (L) 09/13/2021 0539   NA 141 12/09/2016 1113   K 3.4 (L) 09/13/2021 0539   CL 101 09/13/2021 0539   CO2 22 09/13/2021 0539   GLUCOSE 91 09/13/2021 0539   BUN 8 09/13/2021 0539   BUN 10 12/09/2016 1113   CREATININE 0.77 09/13/2021 0539   CREATININE 0.84 10/20/2016 1205   CALCIUM 8.4 (L) 09/13/2021 0539   PROT 7.7 09/11/2021 0400   PROT 7.8 11/12/2016 1042   ALBUMIN 4.1 09/11/2021 0400   ALBUMIN 4.0 11/12/2016 1042   AST 16 09/11/2021 0400   ALT 17 09/11/2021 0400   ALKPHOS 64 09/11/2021 0400   BILITOT 0.7 09/11/2021 0400   BILITOT 0.6 11/12/2016 1042   GFRNONAA >60 09/13/2021 0539   GFRNONAA >89 10/20/2016 1205   GFRAA >60 03/31/2020 0543   GFRAA >89 10/20/2016  1205   Lipase     Component Value Date/Time   LIPASE 42 09/11/2021 0400       Studies/Results: No results found.  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  - minimal clear liquids. - Repeat CT today  - continue IV antibiotics - cont conservative management at this time.   FEN - IVFs, CLD VTE - Lovenox ID - zosyn 11/30>>   LOS: 3 days    Maudry Diego, MD FACS Surgical Oncology, General Surgery, Trauma and Critical [facility], [place] [phone] for weekday/non holidays Check [email or web address] for coverage night/weekend/holidays  Do not use SecureChat as it is not reliable for timely patient care.

## 2021-09-14 NOTE — Progress Notes (Signed)
PROGRESS NOTE    Lori Mcknight  YPP:[id number] DOB: 08-29-1986 DOA: 09/11/2021 PCP: Claiborne Rigg, NP    Chief Complaint  Patient presents with   Abdominal Pain    Brief Narrative:   The patient is a 35 yr old woman who has had multiple recurrences of diverticulitis with abscess formation, depression anxiety, anemia, hypertension, hypothyroidism, PCOS, super morbid obesity presents this time with severe abdominal pain.  CT of the abdomen and pelvis demonstrated increase in the size of the abscess.  She was started on IV Zosyn and general surgery consulted. General surgery recommended IV antibiotics and clear sips.   Assessment & Plan:   Principal Problem:   Diverticulitis of large intestine with abscess Active Problems:   PCOS (polycystic ovarian syndrome)   Essential hypertension   Hypothyroid   Anemia   Obesity, morbid, BMI 50 or higher (HCC)   Acute diverticulitis of the large intestine with abscess formation This is a chronic and recurrent issue for the patient Dr Gerri Lins has discussed with Dr. Cliffton Asters suggested to the abscess has increased in size  but it remains too small for percutaneous drainage.  Furthermore given the patient's body habitus there is high risk for complications and having the drain being able to reach the skin.  Does not recommend an open procedure at this time due to acute infection and status complications.  General surgery recommends treatment with IV antibiotics and watchful observation.  As the abscess appears to be sitting between the sigmoid colon and the bladder there is likelihood of fistula formation allowing for natural drainage of the abscess.  If she continues to worsen she will probably end up with a partial colon resection sometime in the future. Patient currently is on IV Zosyn continue the same,  continue IV fluids.  Blood cultures were positive for staph epidermidis ? Contamination vs infection.  Advance diet to clears today.  Pain  control.  Pt continues to have pain in the epigastric area, without nausea, and in the lower quadrant. General surgery recommends repeating the CT abdomen and continue with IV antibiotics.     Staph epidermidis bacteremia ? Contaminant, repeat blood cultures today.  Continue with IV Zosyn for now.     Morbid obesity Body mass index is 58.11 kg/m. Increased risk of mortality and morbidity.    Essential hypertension -Blood pressure parameters are optimal.     Hypothyroidism Continue with Synthroid.   Anxiety and depression Continue with home medications.   History of PCOS Patient will probably need to be on metformin on discharge.   Chest tightness yesterday.  EKG NSR.  Continue to monitor.    Hypokalemia:  Replaced.   DVT prophylaxis: (Lovenox/) Code Status: (Full code) Family Communication: no family at bedside.  Disposition:   Status is: Inpatient  Remains inpatient appropriate because: IV antibiotics.        Consultants:  Surgery.   Procedures: none.   Antimicrobials:  Antibiotics Given (last 72 hours)     Date/Time Action Medication Dose Rate   09/11/21 1554 New Bag/Given   piperacillin-tazobactam (ZOSYN) IVPB 3.375 g 3.375 g 12.5 mL/hr   09/11/21 2341 New Bag/Given   piperacillin-tazobactam (ZOSYN) IVPB 3.375 g 3.375 g 12.5 mL/hr   09/12/21 0752 New Bag/Given   piperacillin-tazobactam (ZOSYN) IVPB 3.375 g 3.375 g 12.5 mL/hr   09/12/21 1537 New Bag/Given   piperacillin-tazobactam (ZOSYN) IVPB 3.375 g 3.375 g 12.5 mL/hr   09/12/21 2327 New Bag/Given   piperacillin-tazobactam (ZOSYN) IVPB 3.375 g 3.375  g 12.5 mL/hr   09/13/21 0856 New Bag/Given   piperacillin-tazobactam (ZOSYN) IVPB 3.375 g 3.375 g 12.5 mL/hr   09/13/21 1644 New Bag/Given   piperacillin-tazobactam (ZOSYN) IVPB 3.375 g 3.375 g 12.5 mL/hr   09/14/21 0016 New Bag/Given   piperacillin-tazobactam (ZOSYN) IVPB 3.375 g 3.375 g 12.5 mL/hr   09/14/21 0828 New Bag/Given    piperacillin-tazobactam (ZOSYN) IVPB 3.375 g 3.375 g 12.5 mL/hr         Subjective: Epigastric pain, lower quadrant abd pain.   Objective: Vitals:   09/13/21 1218 09/13/21 1958 09/13/21 2035 09/14/21 0310  BP: 119/80 (!) 139/114 128/88 136/80  Pulse: 90 100 79 91  Resp: Temp: 98.2 F (36.8 C) 97.6 F (36.4 C) 97.6 F (36.4 C) 98.4 F (36.9 C)  TempSrc: Oral Oral Oral Oral  SpO2: 99% 100% 100% 97%  Weight:      Height:        Intake/Output Summary (Last 24 hours) at 09/14/2021 1211 Last data filed at 09/14/2021 0753 Gross per 24 hour  Intake 2087.61 ml  Output --  Net 2087.61 ml    Filed Weights   09/11/21 0303  Weight: (!) 163.3 kg    Examination:   General exam: morbidly obese lady in mild distress.  Respiratory system: Clear to auscultation. Respiratory effort normal. Cardiovascular system: S1 & S2 heard, RRR. No JVD, murmurs,  No pedal edema. Gastrointestinal system: Abdomen is soft, tender generalized. More in the epigastric area. Bowel sound heard.  Central nervous system: Alert and oriented. No focal neurological deficits. Extremities: Symmetric 5 x 5 power. Skin: No rashes, lesions or ulcers Psychiatry: anxious.       Data Reviewed: I have personally reviewed following labs and imaging studies  CBC: Recent Labs  Lab 09/11/21 0400 09/13/21 0539 09/14/21 0748  WBC 5.4 10.3 14.1*  NEUTROABS 3.3  --   --   HGB 14.8 14.7 15.0  HCT 45.4 43.3 45.8  MCV 95.8 95.2 96.4  PLT 203 154 186     Basic Metabolic Panel: Recent Labs  Lab 09/11/21 0400 09/13/21 0539  NA 139 133*  K 3.5 3.4*  CL 105 101  CO2 24 22  GLUCOSE 113* 91  BUN 9 8  CREATININE 0.75 0.77  CALCIUM 9.5 8.4*     GFR: Estimated Creatinine Clearance: 154.9 mL/min (by C-G formula based on SCr of 0.77 mg/dL).  Liver Function Tests: Recent Labs  Lab 09/11/21 0400  AST 16  ALT 17  ALKPHOS 64  BILITOT 0.7  PROT 7.7  ALBUMIN 4.1     CBG: No results for  input(s): GLUCAP in the last 168 hours.   Recent Results (from the past 240 hour(s))  Blood culture (routine x 2)     Status: Abnormal   Collection Time: 09/11/21  6:42 AM   Specimen: BLOOD  Result Value Ref Range Status   Specimen Description   Final    BLOOD BLOOD LEFT FOREARM Performed at [facility], [address], [place], [place] [address]    Special Requests   Final    Blood Culture adequate volume BOTTLES DRAWN AEROBIC AND ANAEROBIC Performed at [facility], [address], [place], [place] [address]    Culture  Setup Time   Final    GRAM POSITIVE COCCI IN BOTH AEROBIC AND ANAEROBIC BOTTLES CRITICAL RESULT CALLED TO, READ BACK BY AND VERIFIED WITH: PHARMD K.SHADE AT 1213 ON 09/12/2021 BY T.SAAD.    Culture (A)  Final    STAPHYLOCOCCUS EPIDERMIDIS THE SIGNIFICANCE OF ISOLATING THIS ORGANISM FROM A SINGLE VENIPUNCTURE CANNOT BE PREDICTED WITHOUT FURTHER CLINICAL AND CULTURE CORRELATION. SUSCEPTIBILITIES AVAILABLE ONLY ON REQUEST. Performed at [facility], [address] [place], [place] [address]    Report Status 09/14/2021 FINAL  Final  Blood Culture ID Panel (Reflexed)     Status: Abnormal   Collection Time: 09/11/21  6:42 AM  Result Value Ref Range Status   Enterococcus faecalis NOT DETECTED NOT DETECTED Final   Enterococcus Faecium NOT DETECTED NOT DETECTED Final   Listeria monocytogenes NOT DETECTED NOT DETECTED Final   Staphylococcus species DETECTED (A) NOT DETECTED Final    Comment: CRITICAL RESULT CALLED TO, READ BACK BY AND VERIFIED WITH: PHARMD K.SHADE AT 1213 ON 09/12/2021 BY T.SAAD.    Staphylococcus aureus (BCID) NOT DETECTED NOT DETECTED Final   Staphylococcus epidermidis DETECTED (A) NOT DETECTED Final    Comment: CRITICAL RESULT CALLED TO, READ BACK BY AND VERIFIED WITH: PHARMD K.SHADE AT 1213 ON 09/12/2021 BY T.SAAD.    Staphylococcus lugdunensis NOT DETECTED NOT DETECTED Final   Streptococcus  species NOT DETECTED NOT DETECTED Final   Streptococcus agalactiae NOT DETECTED NOT DETECTED Final   Streptococcus pneumoniae NOT DETECTED NOT DETECTED Final   Streptococcus pyogenes NOT DETECTED NOT DETECTED Final   A.calcoaceticus-baumannii NOT DETECTED NOT DETECTED Final   Bacteroides fragilis NOT DETECTED NOT DETECTED Final   Enterobacterales NOT DETECTED NOT DETECTED Final   Enterobacter cloacae complex NOT DETECTED NOT DETECTED Final   Escherichia coli NOT DETECTED NOT DETECTED Final   Klebsiella aerogenes NOT DETECTED NOT DETECTED Final   Klebsiella oxytoca NOT DETECTED NOT DETECTED Final   Klebsiella pneumoniae NOT DETECTED NOT DETECTED Final   Proteus species NOT DETECTED NOT DETECTED Final   Salmonella species NOT DETECTED NOT DETECTED Final   Serratia marcescens NOT DETECTED NOT DETECTED Final   Haemophilus influenzae NOT DETECTED NOT DETECTED Final   Neisseria meningitidis NOT DETECTED NOT DETECTED Final   Pseudomonas aeruginosa NOT DETECTED NOT DETECTED Final   Stenotrophomonas maltophilia NOT DETECTED NOT DETECTED Final   Candida albicans NOT DETECTED NOT DETECTED Final   Candida auris NOT DETECTED NOT DETECTED Final   Candida glabrata NOT DETECTED NOT DETECTED Final   Candida krusei NOT DETECTED NOT DETECTED Final   Candida parapsilosis NOT DETECTED NOT DETECTED Final   Candida tropicalis NOT DETECTED NOT DETECTED Final   Cryptococcus neoformans/gattii NOT DETECTED NOT DETECTED Final   Methicillin resistance mecA/C NOT DETECTED NOT DETECTED Final    Comment: Performed at [facility], [address] [place], [place] [address]  Resp Panel by RT-PCR (Flu A&B, Covid) Nasopharyngeal Swab     Status: None   Collection Time: 09/11/21 10:45 AM   Specimen: Nasopharyngeal Swab; Nasopharyngeal(NP) swabs in vial transport medium  Result Value Ref Range Status   SARS Coronavirus 2 by RT PCR NEGATIVE NEGATIVE Final    Comment: (NOTE) SARS-CoV-2 target nucleic acids are NOT  DETECTED.  The SARS-CoV-2 RNA is generally detectable in upper respiratory specimens during the acute phase of infection. The lowest concentration of SARS-CoV-2 viral copies this assay can detect is 138 copies/mL. A negative result does not preclude SARS-Cov-2 infection and should not be used as the sole basis for treatment or other patient management decisions. A negative result may occur with  improper specimen collection/handling, submission of specimen other than nasopharyngeal swab, presence of viral mutation(s) within the areas targeted by this assay, and inadequate number of  viral copies(<138 copies/mL). A negative result must be combined with clinical observations, patient history, and epidemiological information. The expected result is Negative.  Fact Sheet for Patients:  BloggerCourse.com  Fact Sheet for Healthcare Providers:  SeriousBroker.it  This test is no t yet approved or cleared by the [place] FDA and  has been authorized for detection and/or diagnosis of SARS-CoV-2 by FDA under an Emergency Use Authorization (EUA). This EUA will remain  in effect (meaning this test can be used) for the duration of the COVID-19 declaration under Section 564(b)(1) of the Act, 21 U.S.C.section 360bbb-3(b)(1), unless the authorization is terminated  or revoked sooner.       Influenza A by PCR NEGATIVE NEGATIVE Final   Influenza B by PCR NEGATIVE NEGATIVE Final    Comment: (NOTE) The Xpert Xpress SARS-CoV-2/FLU/RSV plus assay is intended as an aid in the diagnosis of influenza from Nasopharyngeal swab specimens and should not be used as a sole basis for treatment. Nasal washings and aspirates are unacceptable for Xpert Xpress SARS-CoV-2/FLU/RSV testing.  Fact Sheet for Patients: BloggerCourse.com  Fact Sheet for Healthcare Providers: SeriousBroker.it  This test is not yet  approved or cleared by the [place] FDA and has been authorized for detection and/or diagnosis of SARS-CoV-2 by FDA under an Emergency Use Authorization (EUA). This EUA will remain in effect (meaning this test can be used) for the duration of the COVID-19 declaration under Section 564(b)(1) of the Act, 21 U.S.C. section 360bbb-3(b)(1), unless the authorization is terminated or revoked.  Performed at [facility], [address] [place], [place] [address]   Blood culture (routine x 2)     Status: None (Preliminary result)   Collection Time: 09/11/21  3:52 PM   Specimen: BLOOD RIGHT HAND  Result Value Ref Range Status   Specimen Description   Final    BLOOD RIGHT HAND Performed at [facility], [address] [place], [place] [address]    Special Requests   Final    BOTTLES DRAWN AEROBIC AND ANAEROBIC Blood Culture adequate volume Performed at [facility], [address], [place], [place] [address]    Culture  Setup Time   Final    GRAM POSITIVE COCCI IN CLUSTERS ANAEROBIC BOTTLE ONLY CRITICAL RESULT CALLED TO, READ BACK BY AND VERIFIED WITH: Damaris Hippo PHARMD, AT 8101 09/13/21 D. Leighton Roach    Culture   Final    GRAM POSITIVE COCCI IDENTIFICATION TO FOLLOW Performed at [facility], [address], [place], [place] [address]    Report Status PENDING  Incomplete  Culture, blood (Routine X 2) w Reflex to ID Panel     Status: None (Preliminary result)   Collection Time: 09/11/21  5:14 PM   Specimen: BLOOD  Result Value Ref Range Status   Specimen Description   Final    BLOOD BLOOD LEFT HAND Performed at [facility], [address], [place], [place] [address]    Special Requests   Final    BOTTLES DRAWN AEROBIC ONLY Blood Culture results may not be optimal due to an inadequate volume of blood received in culture bottles Performed at [facility], [address],  [place], [place] [address]    Culture   Final    NO GROWTH 1 DAY Performed at [facility], [address], [place], [place] [address]    Report Status PENDING  Incomplete          Radiology Studies:  No results found.      Scheduled Meds:  enoxaparin (LOVENOX) injection  80 mg Subcutaneous Q12H   iohexol  500 mL Oral Q1H   levothyroxine  88 mcg Oral QAC breakfast   medroxyPROGESTERone  10 mg Oral Daily   pantoprazole (PROTONIX) IV  40 mg Intravenous Q24H   Continuous Infusions:  sodium chloride 10 mL/hr at 09/11/21 0658   sodium chloride 75 mL/hr at 09/14/21 0753   methocarbamol (ROBAXIN) IV     piperacillin-tazobactam (ZOSYN)  IV 3.375 g (09/14/21 0828)     LOS: 3 days        Kathlen Mody, MD [facility]   To contact the attending provider between 7A-7P or the covering provider during after hours 7P-7A, please log into the web site [email or web address] and access using universal [place] password for that web site. If you do not have the password, please call the hospital operator.  09/14/2021, 12:11 PM

## 2021-09-14 NOTE — Progress Notes (Signed)
NT came to inform RN pt c/o chest pain. On assessment, Pt c/o chest pain of 10 on a (0-10 scale), dull, sharp radiating to the right shoulder. RN asked pt if the pain is new and sudden onset but pt informed RN it's been ongoing in which MD is aware. VS taken and prn medication administered, pt placed on 3L oxygen for comfort. Will continue to closely monitor. Dionne Bucy RN    09/13/21 1958  Vitals  Temp 97.6 F (36.4 C)  Temp Source Oral  BP (!) 139/114 (RN notified Miguel Rota 09/13/21)  MAP (mmHg) 123  BP Location Left Arm  BP Method Automatic  Patient Position (if appropriate) Sitting  Pulse Rate 100  Pulse Rate Source Monitor  Resp 18  MEWS COLOR  MEWS Score Color Green  Oxygen Therapy  SpO2 100 %  O2 Device Nasal Cannula  O2 Flow Rate (L/min) 3 L/min (Entered incorrectly McKayla Moore 09/13/21)  MEWS Score  MEWS Temp 0  MEWS Systolic 0  MEWS Pulse 0  MEWS RR 0  MEWS LOC 0  MEWS Score 0

## 2021-09-14 NOTE — Progress Notes (Signed)
RN was interrupted by [facility] unable to notify MD/NP on-call but PRN medication administered effective and pt sleeping with call light within reach. Will continue to closely monitor. Dionne Bucy RN   09/13/21 1958 09/13/21 2035  Vitals  Temp 97.6 F (36.4 C) 97.6 F (36.4 C)  Temp Source  --  Oral  BP (!) 139/114 (RN notified Miguel Rota 09/13/21) 128/88  MAP (mmHg)  --  101  BP Location  --  Right Arm  BP Method  --  Automatic  Patient Position (if appropriate)  --  Lying  Pulse Rate 100 79  Pulse Rate Source  --  Monitor  Resp 18 18  MEWS COLOR  MEWS Score Color Green Green  Oxygen Therapy  SpO2  --  100 %  O2 Device  --  Nasal Cannula  O2 Flow Rate (L/min)  --  3 L/min  Pain Assessment  Pain Score  --  Asleep  MEWS Score  MEWS Temp 0 0  MEWS Systolic 0 0  MEWS Pulse 0 0  MEWS RR 0 0  MEWS LOC 0 0  MEWS Score 0 0

## 2021-09-14 NOTE — Progress Notes (Signed)
  Echocardiogram 2D Echocardiogram has been performed.  Lori Mcknight 09/14/2021, 2:41 PM

## 2021-09-15 LAB — CBC
HCT: 44 % (ref 36.0–46.0)
Hemoglobin: 14.6 g/dL (ref 12.0–15.0)
MCH: 31.6 pg (ref 26.0–34.0)
MCHC: 33.2 g/dL (ref 30.0–36.0)
MCV: 95.2 fL (ref 80.0–100.0)
Platelets: 197 10*3/uL (ref 150–400)
RBC: 4.62 MIL/uL (ref 3.87–5.11)
RDW: 12.3 % (ref 11.5–15.5)
WBC: 10.9 10*3/uL — ABNORMAL HIGH (ref 4.0–10.5)
nRBC: 0 % (ref 0.0–0.2)

## 2021-09-15 LAB — BASIC METABOLIC PANEL
Anion gap: 5 (ref 5–15)
BUN: 5 mg/dL — ABNORMAL LOW (ref 6–20)
CO2: 25 mmol/L (ref 22–32)
Calcium: 8.4 mg/dL — ABNORMAL LOW (ref 8.9–10.3)
Chloride: 103 mmol/L (ref 98–111)
Creatinine, Ser: 0.81 mg/dL (ref 0.44–1.00)
GFR, Estimated: >60 mL/min (ref >60)
Glucose, Bld: 85 mg/dL (ref 70–99)
Potassium: 3.4 mmol/L — ABNORMAL LOW (ref 3.5–5.1)
Sodium: 133 mmol/L — ABNORMAL LOW (ref 135–145)

## 2021-09-15 LAB — CULTURE, BLOOD (ROUTINE X 2): Special Requests: ADEQUATE

## 2021-09-15 MED ORDER — POTASSIUM CHLORIDE 20 MEQ PO PACK
40.0000 meq | PACK | Freq: Once | ORAL | Status: AC
  Administered 2021-09-15: 16:00:00 40 meq via ORAL
  Filled 2021-09-15: qty 2

## 2021-09-15 MED ORDER — POTASSIUM CHLORIDE 10 MEQ/100ML IV SOLN
10.0000 meq | INTRAVENOUS | Status: DC
  Administered 2021-09-15: 14:00:00 10 meq via INTRAVENOUS
  Filled 2021-09-15: qty 100

## 2021-09-15 NOTE — Progress Notes (Signed)
Pts pain was not controlled with 2mg  of morphine increased to the 4mg  dose which helped for a couple hours but awoke in pain shortly before next does was due.

## 2021-09-15 NOTE — Progress Notes (Signed)
[facility] Progress Note     Subjective: CC-  Repeat CT  yesterday looked worse and explained worsening symptoms.  She feels pretty much the same today, but is now NPO. Small abscess seen, but not big enough for perc drain.   Objective: Vital signs in last 24 hours: Temp:  [98.7 F (37.1 C)-99 F (37.2 C)] 98.7 F (37.1 C) (12/04 0231) Pulse Rate:  [84-102] 84 (12/04 0231) Resp:  [18-20] 20 (12/04 0231) BP: (128-132)/(72-89) 132/72 (12/04 0231) SpO2:  [96 %-99 %] 96 % (12/04 0231) Last BM Date: 09/14/21 (per pt statement)  Intake/Output from previous day: 12/03 0701 - 12/04 0700 In: 1118.9 [I.V.:935.8; IV Piggyback:183.1] Out: -  Intake/Output this shift: No intake/output data recorded.  PE: Gen:  Alert, pleasant, looks uncomfortable Pulm:  rate and effort normal Abd: obese, soft, left mid abdomen, LLQ, Suprapubic and some RLQ tenderness with voluntary guarding.  Psych: A&Ox3 Skin: no rashes noted, warm and dry  Lab Results:  Recent Labs    09/14/21 0748 09/15/21 0533  WBC 14.1* 10.9*  HGB 15.0 14.6  HCT 45.8 44.0  PLT 186 197   BMET Recent Labs    09/13/21 0539 09/15/21 0533  NA 133* 133*  K 3.4* 3.4*  CL 101 103  CO2 22 25  GLUCOSE 91 85  BUN 8 5*  CREATININE 0.77 0.81  CALCIUM 8.4* 8.4*   PT/INR No results for input(s): LABPROT, INR in the last 72 hours. CMP     Component Value Date/Time   NA 133 (L) 09/15/2021 0533   NA 141 12/09/2016 1113   K 3.4 (L) 09/15/2021 0533   CL 103 09/15/2021 0533   CO2 25 09/15/2021 0533   GLUCOSE 85 09/15/2021 0533   BUN 5 (L) 09/15/2021 0533   BUN 10 12/09/2016 1113   CREATININE 0.81 09/15/2021 0533   CREATININE 0.84 10/20/2016 1205   CALCIUM 8.4 (L) 09/15/2021 0533   PROT 7.7 09/11/2021 0400   PROT 7.8 11/12/2016 1042   ALBUMIN 4.1 09/11/2021 0400   ALBUMIN 4.0 11/12/2016 1042   AST 16 09/11/2021 0400   ALT 17 09/11/2021 0400   ALKPHOS 64 09/11/2021 0400   BILITOT 0.7 09/11/2021 0400    BILITOT 0.6 11/12/2016 1042   GFRNONAA >60 09/15/2021 0533   GFRNONAA >89 10/20/2016 1205   GFRAA >60 03/31/2020 0543   GFRAA >89 10/20/2016 1205   Lipase     Component Value Date/Time   LIPASE 42 09/11/2021 0400       Studies/Results: CT ABDOMEN PELVIS W CONTRAST  Addendum Date: 09/14/2021   ADDENDUM REPORT: 09/14/2021 16:48 ADDENDUM: These results were called by telephone at the time of interpretation on 09/14/2021 at 4:44 pm to provider Kathlen Mody MD, who verbally acknowledged these results. Electronically Signed   By: Tish Frederickson M.D.   On: 09/14/2021 16:48   Result Date: 09/14/2021 CLINICAL DATA:  Diverticulitis. F/u abscess after iv antibiotics Best imaging due to body habitus EXAM: CT ABDOMEN AND PELVIS WITH CONTRAST TECHNIQUE: Multidetector CT imaging of the abdomen and pelvis was performed using the standard protocol following bolus administration of intravenous contrast. CONTRAST:  OMNIPAQUE IOHEXOL 350 MG/ML SOLN COMPARISON:  CT abdomen pelvis 08/11/2021, CT abdomen pelvis 05/20/2021, CT abdomen pelvis 05/09/2021, CT abdomen pelvis 03/26/2020 FINDINGS: Lower chest: Interval development of a trace right pleural effusion. Associated passive atelectasis of right lower lobe. Bibasilar subsegmental atelectasis. Hepatobiliary: No focal liver abnormality. No gallstones, gallbladder wall thickening, or pericholecystic fluid. No biliary dilatation.  Pancreas: No focal lesion. Normal pancreatic contour. No surrounding inflammatory changes. No main pancreatic ductal dilatation. Spleen: Normal in size without focal abnormality. Adrenals/Urinary Tract: No adrenal nodule bilaterally. Bilateral kidneys enhance symmetrically. No hydronephrosis. No hydroureter. The urinary bladder is unremarkable. No gas noted within the urinary bladder lumen or wall. Stomach/Bowel: Stomach is within normal limits. No evidence of bowel wall thickening or dilatation. Persistent marked bowel wall thickening and  pericolonic fat stranding of the mid sigmoid colon in the setting of colonic diverticulosis. Associated perforation is again noted (2:71). Appendix appears normal. Vascular/Lymphatic: No abdominal aorta or iliac aneurysm. Mild atherosclerotic plaque of the aorta and its branches. Persistent prominent/borderline enlarged left retroperitoneal lymph nodes measuring up to 1.2 cm. No pelvic or inguinal lymphadenopathy. Reproductive: Uterus and bilateral adnexa are unremarkable. Other: Interval increase in of small volume free intraperitoneal gas. Interval increase in trace free fluid within the pelvis. Interval development of a region of free fluid and gas along the known segment of mid sigmoid diverticulitis likely representing a developing organized fluid collection/abscess. Interval increase in size of an abscess formation along the urinary bladder dome that now measures measures 1.9 cm (from 1.1 cm). Musculoskeletal: No abdominal wall hernia or abnormality. No suspicious lytic or blastic osseous lesions. No acute displaced fracture. Degenerative changes of the spine with intervertebral disc space vacuum phenomenon, posterior disc osteophyte complex, mild endplate sclerosis, osteophyte formation at the L5-S1 level. IMPRESSION: 1. Interval worsening of disease in a patient with complicated sigmoid diverticulitis. 2. Interval increase in small volume pneumoperitoneum consistent with perforation. 3. Interval development of a region of free fluid and gas along the known segment of mid sigmoid diverticulitis likely representing a developing organized fluid collection/abscess. Please consider use of PO and IV contrast in following CT scans in this patient. 4. Interval increase in size of a 1.9 cm (from 1.1 cm) abscess along the urinary bladder dome. 5. Persistent prominent/borderline enlarged left retroperitoneal lymph nodes measuring up to 1.2 cm. Findings may be reactive in etiology. Recommend attention on follow-up. 6.  Recommend colonoscopy status post treatment and status post complete resolution of inflammatory changes to exclude an underlying lesion. 7. Interval development of trace right pleural effusion. Electronically Signed: By: Tish Frederickson M.D. On: 09/14/2021 16:44   ECHOCARDIOGRAM COMPLETE  Result Date: 09/14/2021    ECHOCARDIOGRAM REPORT   Patient Name:   Lori Mcknight Date of Exam: 09/14/2021 Medical Rec #:  [id number]    Height:       66.0 in Accession #:    [id number]   Weight:       360.0 lb Date of Birth:  [birth year]    BSA:          2.567 m Patient Age:    35 years     BP:           136/80 mmHg Patient Gender: F            HR:           94 bpm. Exam Location:  Inpatient Procedure: 2D Echo, Cardiac Doppler, Color Doppler and Intracardiac            Opacification Agent Indications:    Bacteremia  History:        Patient has no prior history of Echocardiogram examinations.                 Signs/Symptoms:Shortness of Breath; Risk Factors:Hypertension.  Sonographer:    Eulah Pont RDCS Referring Phys: Herminio Heads  Sonographer Comments: Patient is morbidly obese. IMPRESSIONS  1. Technically difficult echo with poor image quality.  2. Left ventricular ejection fraction, by estimation, is 65 to 70%. The left ventricle has normal function. The left ventricle has no regional wall motion abnormalities. Left ventricular diastolic parameters were normal.  3. Right ventricular systolic function is normal. The right ventricular size is normal.  4. The mitral valve was not well visualized. Trivial mitral valve regurgitation.  5. The aortic valve was not well visualized. Aortic valve regurgitation is not visualized. FINDINGS  Left Ventricle: Left ventricular ejection fraction, by estimation, is 65 to 70%. The left ventricle has normal function. The left ventricle has no regional wall motion abnormalities. Definity contrast agent was given IV to delineate the left ventricular  endocardial borders. The left ventricular  internal cavity size was small. There is no left ventricular hypertrophy. Left ventricular diastolic parameters were normal. Right Ventricle: The right ventricular size is normal. Right vetricular wall thickness was not well visualized. Right ventricular systolic function is normal. Left Atrium: Left atrial size was normal in size. Right Atrium: Right atrial size was normal in size. Pericardium: There is no evidence of pericardial effusion. Mitral Valve: The mitral valve was not well visualized. Trivial mitral valve regurgitation. Tricuspid Valve: The tricuspid valve is not well visualized. Tricuspid valve regurgitation is not demonstrated. Aortic Valve: The aortic valve was not well visualized. Aortic valve regurgitation is not visualized. Pulmonic Valve: The pulmonic valve was not well visualized. Pulmonic valve regurgitation is not visualized. Aorta: The aortic root and ascending aorta are structurally normal, with no evidence of dilitation. IAS/Shunts: The interatrial septum was not well visualized. Additional Comments: Technically difficult echo with poor image quality.  LEFT VENTRICLE PLAX 2D LVIDd:         4.00 cm   Diastology LVIDs:         2.60 cm   LV e' medial:    7.51 cm/s LV PW:         1.10 cm   LV E/e' medial:  9.8 LV IVS:        1.10 cm   LV e' lateral:   9.79 cm/s LVOT diam:     2.10 cm   LV E/e' lateral: 7.5 LV SV:         47 LV SV Index:   18 LVOT Area:     3.46 cm  RIGHT VENTRICLE RV S prime:     13.50 cm/s TAPSE (M-mode): 1.8 cm LEFT ATRIUM           Index        RIGHT ATRIUM           Index LA diam:      3.60 cm 1.40 cm/m   RA Area:     20.70 cm LA Vol (A4C): 58.5 ml 22.79 ml/m  RA Volume:   63.20 ml  24.62 ml/m  AORTIC VALVE LVOT Vmax:   92.10 cm/s LVOT Vmean:  58.700 cm/s LVOT VTI:    0.137 m  AORTA Ao Root diam: 3.20 cm Ao Asc diam:  3.30 cm MITRAL VALVE MV Area (PHT): 5.75 cm    SHUNTS MV Decel Time: 132 msec    Systemic VTI:  0.14 m MV E velocity: 73.70 cm/s  Systemic Diam: 2.10 cm  MV A velocity: 75.00 cm/s MV E/A ratio:  0.98 Kristeen Miss MD Electronically signed by Kristeen Miss MD Signature Date/Time: 09/14/2021/3:23:52 PM    Final     Anti-infectives: Anti-infectives (  From admission, onward)    Start     Dose/Rate Route Frequency Ordered Stop   09/11/21 1600  piperacillin-tazobactam (ZOSYN) IVPB 3.375 g        3.375 g 12.5 mL/hr over 240 Minutes Intravenous Every 8 hours 09/11/21 1538     09/11/21 0645  piperacillin-tazobactam (ZOSYN) IVPB 3.375 g        3.375 g 100 mL/hr over 30 Minutes Intravenous  Once 09/11/21 4827 09/11/21 0729        Assessment/Plan 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   - minimal clear liquids. - continue IV antibiotics - cont conservative management at this time. It seems that she will require surgery this admission.  Will discuss timing with Dr. Carolynne Edouard who is taking over the service tomorrow.     FEN - IVFs, CLD VTE - Lovenox ID - [email or web address] 11/30>>   LOS: 4 days    Maudry Diego, MD FACS Surgical Oncology, General Surgery, Trauma and Critical [facility], [place] [phone] for weekday/non holidays Check [email or web address] for coverage night/weekend/holidays  Do not use SecureChat as it is not reliable for timely patient care.

## 2021-09-15 NOTE — Progress Notes (Signed)
PROGRESS NOTE    Lori Mcknight  NWG:[id number] DOB: Aug 19, 1986 DOA: 09/11/2021 PCP: Claiborne Rigg, NP    Chief Complaint  Patient presents with   Abdominal Pain    Brief Narrative:   The patient is a 35 yr old woman who has had multiple recurrences of diverticulitis with abscess formation, depression anxiety, anemia, hypertension, hypothyroidism, PCOS, super morbid obesity presents this time with severe abdominal pain.  CT of the abdomen and pelvis demonstrated increase in the size of the abscess.  She was started on IV Zosyn and general surgery consulted. General surgery recommended IV antibiotics and clear sips.   Assessment & Plan:   Principal Problem:   Diverticulitis of large intestine with abscess Active Problems:   PCOS (polycystic ovarian syndrome)   Essential hypertension   Hypothyroid   Anemia   Obesity, morbid, BMI 50 or higher (HCC)   Acute diverticulitis of the large intestine with abscess formation This is a chronic and recurrent issue for the patient Dr Gerri Lins has discussed with Dr. Cliffton Asters suggested to the abscess has increased in size  but it remains too small for percutaneous drainage.  Furthermore given the patient's body habitus there is high risk for complications and having the drain being able to reach the skin.  Does not recommend an open procedure at this time due to acute infection and status complications.  General surgery recommends treatment with IV antibiotics and watchful observation.  As the abscess appears to be sitting between the sigmoid colon and the bladder there is likelihood of fistula formation allowing for natural drainage of the abscess.  If she continues to worsen she will probably end up with a partial colon resection sometime in the future. Patient currently is on IV Zosyn continue the same,  continue IV fluids.  Blood cultures were positive for staph epidermidis ? Contamination vs infection.  Advance diet to clears today.  Pain  control.  Pt continues to have pain in the epigastric area, without nausea, and in the lower quadrant. General surgery recommends repeating the CT abdomen and continue with IV antibiotics.  Repeat CT abd showed Interval increase in small volume pneumoperitoneum consistent with perforation. Interval development of a region of free fluid and gas along the known segment of mid sigmoid diverticulitis likely representing a developing organized fluid collection/abscess.  Surgery contemplating surgery next week.     Staph epidermidis bacteremia ? Contaminant, repeat blood cultures pending so far.  Continue with IV Zosyn for now.   Morbid obesity Body mass index is 58.11 kg/m. Increased risk of mortality and morbidity.    Essential hypertension -Blood pressure parameters are well controlled.     Hypothyroidism Continue with Synthroid.   Anxiety and depression Continue with home medications.   History of PCOS Patient will probably need to be on metformin on discharge.   Chest tightness  Resolved.  EKG NSR.  Continue to monitor.    Hypokalemia:  Replaced. Recheck in am.   Leukocytosis improving.    DVT prophylaxis: (Lovenox/) Code Status: (Full code) Family Communication: no family at bedside.  Disposition:   Status is: Inpatient  Remains inpatient appropriate because: IV antibiotics.        Consultants:  Surgery.   Procedures: none.   Antimicrobials:  Antibiotics Given (last 72 hours)     Date/Time Action Medication Dose Rate   09/12/21 1537 New Bag/Given   piperacillin-tazobactam (ZOSYN) IVPB 3.375 g 3.375 g 12.5 mL/hr   09/12/21 2327 New Bag/Given   piperacillin-tazobactam (ZOSYN)  IVPB 3.375 g 3.375 g 12.5 mL/hr   09/13/21 0856 New Bag/Given   piperacillin-tazobactam (ZOSYN) IVPB 3.375 g 3.375 g 12.5 mL/hr   09/13/21 1644 New Bag/Given   piperacillin-tazobactam (ZOSYN) IVPB 3.375 g 3.375 g 12.5 mL/hr   09/14/21 0016 New Bag/Given    piperacillin-tazobactam (ZOSYN) IVPB 3.375 g 3.375 g 12.5 mL/hr   09/14/21 0828 New Bag/Given   piperacillin-tazobactam (ZOSYN) IVPB 3.375 g 3.375 g 12.5 mL/hr   09/14/21 1836 New Bag/Given   piperacillin-tazobactam (ZOSYN) IVPB 3.375 g 3.375 g 12.5 mL/hr   09/14/21 2350 New Bag/Given   piperacillin-tazobactam (ZOSYN) IVPB 3.375 g 3.375 g 12.5 mL/hr   09/15/21 0852 New Bag/Given   piperacillin-tazobactam (ZOSYN) IVPB 3.375 g 3.375 g 12.5 mL/hr         Subjective: Pain controlled with increased morphine dose.   Objective: Vitals:   09/14/21 1344 09/14/21 1924 09/15/21 0231 09/15/21 1045  BP: 128/80 129/89 132/72 138/83  Pulse: (!) 102 (!) 102 84 99  Resp: Temp: 98.9 F (37.2 C) 99 F (37.2 C) 98.7 F (37.1 C) (!) 97.4 F (36.3 C)  TempSrc: Axillary Oral Oral Oral  SpO2: 99% 96% 96% 99%  Weight:      Height:        Intake/Output Summary (Last 24 hours) at 09/15/2021 1315 Last data filed at 09/15/2021 0346 Gross per 24 hour  Intake 601.25 ml  Output --  Net 601.25 ml    Filed Weights   09/11/21 0303  Weight: (!) 163.3 kg    Examination:   General exam: morbidly obese lady in mild distress.  Respiratory system: Diminished air entry at bases Cardiovascular system: S1 & S2 heard, RRR. No JVD,  No pedal edema. Gastrointestinal system: Abdomen is soft, tender generalized.   Normal bowel sounds heard. Central nervous system: Alert and oriented. No focal neurological deficits. Extremities: Symmetric 5 x 5 power. Skin: No rashes, lesions or ulcers Psychiatry: mood appropriate.        Data Reviewed: I have personally reviewed following labs and imaging studies  CBC: Recent Labs  Lab 09/11/21 0400 09/13/21 0539 09/14/21 0748 09/15/21 0533  WBC 5.4 10.3 14.1* 10.9*  NEUTROABS 3.3  --   --   --   HGB 14.8 14.7 15.0 14.6  HCT 45.4 43.3 45.8 44.0  MCV 95.8 95.2 96.4 95.2  PLT 203 154 186 197     Basic Metabolic Panel: Recent Labs  Lab  09/11/21 0400 09/13/21 0539 09/15/21 0533  NA 139 133* 133*  K 3.5 3.4* 3.4*  CL 105 101 103  CO2 GLUCOSE 113* 91 85  BUN 9 8 5*  CREATININE 0.75 0.77 0.81  CALCIUM 9.5 8.4* 8.4*     GFR: Estimated Creatinine Clearance: 152.9 mL/min (by C-G formula based on SCr of 0.81 mg/dL).  Liver Function Tests: Recent Labs  Lab 09/11/21 0400  AST 16  ALT 17  ALKPHOS 64  BILITOT 0.7  PROT 7.7  ALBUMIN 4.1     CBG: No results for input(s): GLUCAP in the last 168 hours.   Recent Results (from the past 240 hour(s))  Blood culture (routine x 2)     Status: Abnormal   Collection Time: 09/11/21  6:42 AM   Specimen: BLOOD  Result Value Ref Range Status   Specimen Description   Final    BLOOD BLOOD LEFT FOREARM Performed at [facility], [address], [place], [place] [address]  Special Requests   Final    Blood Culture adequate volume BOTTLES DRAWN AEROBIC AND ANAEROBIC Performed at [facility], [address], [place], [place] [address]    Culture  Setup Time   Final    GRAM POSITIVE COCCI IN BOTH AEROBIC AND ANAEROBIC BOTTLES CRITICAL RESULT CALLED TO, READ BACK BY AND VERIFIED WITH: PHARMD K.SHADE AT 1213 ON 09/12/2021 BY T.SAAD.    Culture (A)  Final    STAPHYLOCOCCUS EPIDERMIDIS THE SIGNIFICANCE OF ISOLATING THIS ORGANISM FROM A SINGLE VENIPUNCTURE CANNOT BE PREDICTED WITHOUT FURTHER CLINICAL AND CULTURE CORRELATION. SUSCEPTIBILITIES AVAILABLE ONLY ON REQUEST. Performed at [facility], [address], [place], [place] [address]    Report Status 09/14/2021 FINAL  Final  Blood Culture ID Panel (Reflexed)     Status: Abnormal   Collection Time: 09/11/21  6:42 AM  Result Value Ref Range Status   Enterococcus faecalis NOT DETECTED NOT DETECTED Final   Enterococcus Faecium NOT DETECTED NOT DETECTED Final   Listeria monocytogenes NOT DETECTED NOT DETECTED Final   Staphylococcus species DETECTED (A) NOT DETECTED  Final    Comment: CRITICAL RESULT CALLED TO, READ BACK BY AND VERIFIED WITH: PHARMD K.SHADE AT 1213 ON 09/12/2021 BY T.SAAD.    Staphylococcus aureus (BCID) NOT DETECTED NOT DETECTED Final   Staphylococcus epidermidis DETECTED (A) NOT DETECTED Final    Comment: CRITICAL RESULT CALLED TO, READ BACK BY AND VERIFIED WITH: PHARMD K.SHADE AT 1213 ON 09/12/2021 BY T.SAAD.    Staphylococcus lugdunensis NOT DETECTED NOT DETECTED Final   Streptococcus species NOT DETECTED NOT DETECTED Final   Streptococcus agalactiae NOT DETECTED NOT DETECTED Final   Streptococcus pneumoniae NOT DETECTED NOT DETECTED Final   Streptococcus pyogenes NOT DETECTED NOT DETECTED Final   A.calcoaceticus-baumannii NOT DETECTED NOT DETECTED Final   Bacteroides fragilis NOT DETECTED NOT DETECTED Final   Enterobacterales NOT DETECTED NOT DETECTED Final   Enterobacter cloacae complex NOT DETECTED NOT DETECTED Final   Escherichia coli NOT DETECTED NOT DETECTED Final   Klebsiella aerogenes NOT DETECTED NOT DETECTED Final   Klebsiella oxytoca NOT DETECTED NOT DETECTED Final   Klebsiella pneumoniae NOT DETECTED NOT DETECTED Final   Proteus species NOT DETECTED NOT DETECTED Final   Salmonella species NOT DETECTED NOT DETECTED Final   Serratia marcescens NOT DETECTED NOT DETECTED Final   Haemophilus influenzae NOT DETECTED NOT DETECTED Final   Neisseria meningitidis NOT DETECTED NOT DETECTED Final   Pseudomonas aeruginosa NOT DETECTED NOT DETECTED Final   Stenotrophomonas maltophilia NOT DETECTED NOT DETECTED Final   Candida albicans NOT DETECTED NOT DETECTED Final   Candida auris NOT DETECTED NOT DETECTED Final   Candida glabrata NOT DETECTED NOT DETECTED Final   Candida krusei NOT DETECTED NOT DETECTED Final   Candida parapsilosis NOT DETECTED NOT DETECTED Final   Candida tropicalis NOT DETECTED NOT DETECTED Final   Cryptococcus neoformans/gattii NOT DETECTED NOT DETECTED Final   Methicillin resistance mecA/C NOT DETECTED  NOT DETECTED Final    Comment: Performed at [facility], [address], [place], [place] [address]  Resp Panel by RT-PCR (Flu A&B, Covid) Nasopharyngeal Swab     Status: None   Collection Time: 09/11/21 10:45 AM   Specimen: Nasopharyngeal Swab; Nasopharyngeal(NP) swabs in vial transport medium  Result Value Ref Range Status   SARS Coronavirus 2 by RT PCR NEGATIVE NEGATIVE Final    Comment: (NOTE) SARS-CoV-2 target nucleic acids are NOT DETECTED.  The SARS-CoV-2 RNA is generally detectable in upper respiratory specimens during  the acute phase of infection. The lowest concentration of SARS-CoV-2 viral copies this assay can detect is 138 copies/mL. A negative result does not preclude SARS-Cov-2 infection and should not be used as the sole basis for treatment or other patient management decisions. A negative result may occur with  improper specimen collection/handling, submission of specimen other than nasopharyngeal swab, presence of viral mutation(s) within the areas targeted by this assay, and inadequate number of viral copies(<138 copies/mL). A negative result must be combined with clinical observations, patient history, and epidemiological information. The expected result is Negative.  Fact Sheet for Patients:  BloggerCourse.com  Fact Sheet for Healthcare Providers:  SeriousBroker.it  This test is no t yet approved or cleared by the [place] FDA and  has been authorized for detection and/or diagnosis of SARS-CoV-2 by FDA under an Emergency Use Authorization (EUA). This EUA will remain  in effect (meaning this test can be used) for the duration of the COVID-19 declaration under Section 564(b)(1) of the Act, 21 U.S.C.section 360bbb-3(b)(1), unless the authorization is terminated  or revoked sooner.       Influenza A by PCR NEGATIVE NEGATIVE Final   Influenza B by PCR NEGATIVE NEGATIVE Final    Comment: (NOTE) The  Xpert Xpress SARS-CoV-2/FLU/RSV plus assay is intended as an aid in the diagnosis of influenza from Nasopharyngeal swab specimens and should not be used as a sole basis for treatment. Nasal washings and aspirates are unacceptable for Xpert Xpress SARS-CoV-2/FLU/RSV testing.  Fact Sheet for Patients: BloggerCourse.com  Fact Sheet for Healthcare Providers: SeriousBroker.it  This test is not yet approved or cleared by the [place] FDA and has been authorized for detection and/or diagnosis of SARS-CoV-2 by FDA under an Emergency Use Authorization (EUA). This EUA will remain in effect (meaning this test can be used) for the duration of the COVID-19 declaration under Section 564(b)(1) of the Act, 21 U.S.C. section 360bbb-3(b)(1), unless the authorization is terminated or revoked.  Performed at [facility], [address], [place], [place] [address]   Blood culture (routine x 2)     Status: Abnormal   Collection Time: 09/11/21  3:52 PM   Specimen: BLOOD RIGHT HAND  Result Value Ref Range Status   Specimen Description   Final    BLOOD RIGHT HAND Performed at [facility], [address] [place], [place] [address]    Special Requests   Final    BOTTLES DRAWN AEROBIC AND ANAEROBIC Blood Culture adequate volume Performed at [facility], [address], [place], [place] [address]    Culture  Setup Time   Final    GRAM POSITIVE COCCI IN CLUSTERS ANAEROBIC BOTTLE ONLY CRITICAL RESULT CALLED TO, READ BACK BY AND VERIFIED WITH: Damaris Hippo PHARMD, AT 0355 09/13/21 D. VANHOOK    Culture (A)  Final    STAPHYLOCOCCUS HOMINIS THE SIGNIFICANCE OF ISOLATING THIS ORGANISM FROM A SINGLE SET OF BLOOD CULTURES WHEN MULTIPLE SETS ARE DRAWN IS UNCERTAIN. PLEASE NOTIFY THE MICROBIOLOGY DEPARTMENT WITHIN ONE WEEK IF SPECIATION AND SENSITIVITIES ARE REQUIRED. Performed at [facility], [address], [place], [place] [address]    Report Status 09/15/2021 FINAL  Final  Culture, blood (Routine X 2) w Reflex to ID Panel     Status: None (Preliminary result)   Collection Time: 09/11/21  5:14 PM   Specimen: BLOOD  Result Value Ref Range Status   Specimen Description   Final    BLOOD BLOOD LEFT HAND Performed  at [facility], [address] [place], [place] [address]    Special Requests   Final    BOTTLES DRAWN AEROBIC ONLY Blood Culture results may not be optimal due to an inadequate volume of blood received in culture bottles Performed at [facility], [address], [place], [place] [address]    Culture   Final    NO GROWTH 1 DAY Performed at [facility], [address] [place], [place] [address]    Report Status PENDING  Incomplete  Culture, blood (Routine X 2) w Reflex to ID Panel     Status: None (Preliminary result)   Collection Time: 09/14/21 12:40 PM   Specimen: BLOOD  Result Value Ref Range Status   Specimen Description   Final    BLOOD RIGHT ANTECUBITAL Performed at [facility], [address], [place], [place] [address]    Special Requests   Final    BOTTLES DRAWN AEROBIC ONLY Blood Culture adequate volume Performed at [facility], [address] [place], [place] [address]    Culture   Final    NO GROWTH < 24 HOURS Performed at [facility], [address], [place], [place] [address]    Report Status PENDING  Incomplete  Culture, blood (Routine X 2) w Reflex to ID Panel     Status: None (Preliminary result)   Collection Time: 09/14/21 12:40 PM   Specimen: BLOOD  Result Value Ref Range Status   Specimen Description   Final    BLOOD BLOOD LEFT HAND Performed at [facility], [address], [place], [place] [address]    Special Requests   Final    BOTTLES DRAWN AEROBIC ONLY Blood Culture adequate volume Performed at [facility], [address], [place], [place] [address]    Culture   Final    NO GROWTH < 24 HOURS Performed at [facility], [address] [place], [place] [address]    Report Status PENDING  Incomplete          Radiology Studies: CT ABDOMEN PELVIS W CONTRAST  Addendum Date: 09/14/2021   ADDENDUM REPORT: 09/14/2021 16:48 ADDENDUM: These results were called by telephone at the time of interpretation on 09/14/2021 at 4:44 pm to provider Kathlen Mody MD, who verbally acknowledged these results. Electronically Signed   By: Tish Frederickson M.D.   On: 09/14/2021 16:48   Result Date: 09/14/2021 CLINICAL DATA:  Diverticulitis. F/u abscess after iv antibiotics Best imaging due to body habitus EXAM: CT ABDOMEN AND PELVIS WITH CONTRAST TECHNIQUE: Multidetector CT imaging of the abdomen and pelvis was performed using the standard protocol following bolus administration of intravenous contrast. CONTRAST:  OMNIPAQUE IOHEXOL 350 MG/ML SOLN COMPARISON:  CT abdomen pelvis 08/11/2021, CT abdomen pelvis 05/20/2021, CT abdomen pelvis 05/09/2021, CT abdomen pelvis 03/26/2020 FINDINGS: Lower chest: Interval development of a trace right pleural effusion. Associated passive atelectasis of right lower lobe. Bibasilar subsegmental atelectasis. Hepatobiliary: No focal liver abnormality. No gallstones, gallbladder wall thickening, or pericholecystic fluid. No biliary dilatation. Pancreas: No focal lesion. Normal pancreatic contour. No surrounding inflammatory changes. No main pancreatic ductal dilatation. Spleen: Normal in size without focal abnormality. Adrenals/Urinary Tract: No adrenal nodule bilaterally. Bilateral kidneys enhance symmetrically. No hydronephrosis. No hydroureter. The urinary bladder is unremarkable. No gas noted within the urinary bladder lumen or wall. Stomach/Bowel: Stomach is within normal limits. No evidence of bowel wall thickening or dilatation. Persistent  marked bowel wall  thickening and pericolonic fat stranding of the mid sigmoid colon in the setting of colonic diverticulosis. Associated perforation is again noted (2:71). Appendix appears normal. Vascular/Lymphatic: No abdominal aorta or iliac aneurysm. Mild atherosclerotic plaque of the aorta and its branches. Persistent prominent/borderline enlarged left retroperitoneal lymph nodes measuring up to 1.2 cm. No pelvic or inguinal lymphadenopathy. Reproductive: Uterus and bilateral adnexa are unremarkable. Other: Interval increase in of small volume free intraperitoneal gas. Interval increase in trace free fluid within the pelvis. Interval development of a region of free fluid and gas along the known segment of mid sigmoid diverticulitis likely representing a developing organized fluid collection/abscess. Interval increase in size of an abscess formation along the urinary bladder dome that now measures measures 1.9 cm (from 1.1 cm). Musculoskeletal: No abdominal wall hernia or abnormality. No suspicious lytic or blastic osseous lesions. No acute displaced fracture. Degenerative changes of the spine with intervertebral disc space vacuum phenomenon, posterior disc osteophyte complex, mild endplate sclerosis, osteophyte formation at the L5-S1 level. IMPRESSION: 1. Interval worsening of disease in a patient with complicated sigmoid diverticulitis. 2. Interval increase in small volume pneumoperitoneum consistent with perforation. 3. Interval development of a region of free fluid and gas along the known segment of mid sigmoid diverticulitis likely representing a developing organized fluid collection/abscess. Please consider use of PO and IV contrast in following CT scans in this patient. 4. Interval increase in size of a 1.9 cm (from 1.1 cm) abscess along the urinary bladder dome. 5. Persistent prominent/borderline enlarged left retroperitoneal lymph nodes measuring up to 1.2 cm. Findings may be reactive in etiology. Recommend attention on  follow-up. 6. Recommend colonoscopy status post treatment and status post complete resolution of inflammatory changes to exclude an underlying lesion. 7. Interval development of trace right pleural effusion. Electronically Signed: By: Tish Frederickson M.D. On: 09/14/2021 16:44   ECHOCARDIOGRAM COMPLETE  Result Date: 09/14/2021    ECHOCARDIOGRAM REPORT   Patient Name:   Lori Mcknight Date of Exam: 09/14/2021 Medical Rec #:  [id number]    Height:       66.0 in Accession #:    [id number]   Weight:       360.0 lb Date of Birth:  01/16/1986    BSA:          2.567 m Patient Age:    35 years     BP:           136/80 mmHg Patient Gender: F            HR:           94 bpm. Exam Location:  Inpatient Procedure: 2D Echo, Cardiac Doppler, Color Doppler and Intracardiac            Opacification Agent Indications:    Bacteremia  History:        Patient has no prior history of Echocardiogram examinations.                 Signs/Symptoms:Shortness of Breath; Risk Factors:Hypertension.  Sonographer:    Eulah Pont RDCS Referring Phys: Herminio Heads  Sonographer Comments: Patient is morbidly obese. IMPRESSIONS  1. Technically difficult echo with poor image quality.  2. Left ventricular ejection fraction, by estimation, is 65 to 70%. The left ventricle has normal function. The left ventricle has no regional wall motion abnormalities. Left ventricular diastolic parameters were normal.  3. Right ventricular systolic function is normal. The right ventricular size is normal.  4. The  mitral valve was not well visualized. Trivial mitral valve regurgitation.  5. The aortic valve was not well visualized. Aortic valve regurgitation is not visualized. FINDINGS  Left Ventricle: Left ventricular ejection fraction, by estimation, is 65 to 70%. The left ventricle has normal function. The left ventricle has no regional wall motion abnormalities. Definity contrast agent was given IV to delineate the left ventricular  endocardial borders. The  left ventricular internal cavity size was small. There is no left ventricular hypertrophy. Left ventricular diastolic parameters were normal. Right Ventricle: The right ventricular size is normal. Right vetricular wall thickness was not well visualized. Right ventricular systolic function is normal. Left Atrium: Left atrial size was normal in size. Right Atrium: Right atrial size was normal in size. Pericardium: There is no evidence of pericardial effusion. Mitral Valve: The mitral valve was not well visualized. Trivial mitral valve regurgitation. Tricuspid Valve: The tricuspid valve is not well visualized. Tricuspid valve regurgitation is not demonstrated. Aortic Valve: The aortic valve was not well visualized. Aortic valve regurgitation is not visualized. Pulmonic Valve: The pulmonic valve was not well visualized. Pulmonic valve regurgitation is not visualized. Aorta: The aortic root and ascending aorta are structurally normal, with no evidence of dilitation. IAS/Shunts: The interatrial septum was not well visualized. Additional Comments: Technically difficult echo with poor image quality.  LEFT VENTRICLE PLAX 2D LVIDd:         4.00 cm   Diastology LVIDs:         2.60 cm   LV e' medial:    7.51 cm/s LV PW:         1.10 cm   LV E/e' medial:  9.8 LV IVS:        1.10 cm   LV e' lateral:   9.79 cm/s LVOT diam:     2.10 cm   LV E/e' lateral: 7.5 LV SV:         47 LV SV Index:   18 LVOT Area:     3.46 cm  RIGHT VENTRICLE RV S prime:     13.50 cm/s TAPSE (M-mode): 1.8 cm LEFT ATRIUM           Index        RIGHT ATRIUM           Index LA diam:      3.60 cm 1.40 cm/m   RA Area:     20.70 cm LA Vol (A4C): 58.5 ml 22.79 ml/m  RA Volume:   63.20 ml  24.62 ml/m  AORTIC VALVE LVOT Vmax:   92.10 cm/s LVOT Vmean:  58.700 cm/s LVOT VTI:    0.137 m  AORTA Ao Root diam: 3.20 cm Ao Asc diam:  3.30 cm MITRAL VALVE MV Area (PHT): 5.75 cm    SHUNTS MV Decel Time: 132 msec    Systemic VTI:  0.14 m MV E velocity: 73.70 cm/s   Systemic Diam: 2.10 cm MV A velocity: 75.00 cm/s MV E/A ratio:  0.98 Kristeen Miss MD Electronically signed by Kristeen Miss MD Signature Date/Time: 09/14/2021/3:23:52 PM    Final         Scheduled Meds:  enoxaparin (LOVENOX) injection  80 mg Subcutaneous Q12H   levothyroxine  88 mcg Oral QAC breakfast   medroxyPROGESTERone  10 mg Oral Daily   pantoprazole (PROTONIX) IV  40 mg Intravenous Q24H   Continuous Infusions:  sodium chloride 75 mL/hr at 09/15/21 0851   methocarbamol (ROBAXIN) IV 500 mg (09/15/21 0745)   piperacillin-tazobactam (ZOSYN)  IV 3.375 g (09/15/21 0852)     LOS: 4 days        Kathlen Mody, MD [facility]   To contact the attending provider between 7A-7P or the covering provider during after hours 7P-7A, please log into the web site [email or web address] and access using universal [facility] password for that web site. If you do not have the password, please call the hospital operator.  09/15/2021, 1:15 PM

## 2021-09-16 LAB — HEMOGLOBIN A1C
Hgb A1c MFr Bld: 5.4 % (ref 4.8–5.6)
Mean Plasma Glucose: 108.28 mg/dL

## 2021-09-16 LAB — BASIC METABOLIC PANEL
Anion gap: 9 (ref 5–15)
BUN: <5 mg/dL — ABNORMAL LOW (ref 6–20)
CO2: 25 mmol/L (ref 22–32)
Calcium: 8.7 mg/dL — ABNORMAL LOW (ref 8.9–10.3)
Chloride: 102 mmol/L (ref 98–111)
Creatinine, Ser: 0.77 mg/dL (ref 0.44–1.00)
GFR, Estimated: >60 mL/min (ref >60)
Glucose, Bld: 106 mg/dL — ABNORMAL HIGH (ref 70–99)
Potassium: 3.9 mmol/L (ref 3.5–5.1)
Sodium: 136 mmol/L (ref 135–145)

## 2021-09-16 LAB — PREALBUMIN: Prealbumin: 9.7 mg/dL — ABNORMAL LOW (ref 18–38)

## 2021-09-16 LAB — MAGNESIUM: Magnesium: 1.9 mg/dL (ref 1.7–2.4)

## 2021-09-16 LAB — PHOSPHORUS: Phosphorus: 2.9 mg/dL (ref 2.5–4.6)

## 2021-09-16 MED ORDER — LACTATED RINGERS IV BOLUS: 1000.0000 mL | Freq: Three times a day (TID) | INTRAVENOUS | Status: AC | PRN

## 2021-09-16 MED ORDER — NEOMYCIN SULFATE 500 MG PO TABS
1000.0000 mg | ORAL_TABLET | ORAL | Status: DC
  Filled 2021-09-16 (×2): qty 2

## 2021-09-16 MED ORDER — ALVIMOPAN 12 MG PO CAPS
12.0000 mg | ORAL_CAPSULE | ORAL | Status: AC
  Administered 2021-09-17: 12 mg via ORAL
  Filled 2021-09-16: qty 1

## 2021-09-16 MED ORDER — CELECOXIB 200 MG PO CAPS
200.0000 mg | ORAL_CAPSULE | ORAL | Status: AC
  Administered 2021-09-17: 200 mg via ORAL
  Filled 2021-09-16: qty 1

## 2021-09-16 MED ORDER — BISACODYL 5 MG PO TBEC
20.0000 mg | DELAYED_RELEASE_TABLET | Freq: Once | ORAL | Status: AC
  Administered 2021-09-16: 20 mg via ORAL
  Filled 2021-09-16: qty 4

## 2021-09-16 MED ORDER — ENSURE PRE-SURGERY PO LIQD
592.0000 mL | Freq: Once | ORAL | Status: AC
  Administered 2021-09-16: 592 mL via ORAL
  Filled 2021-09-16: qty 592

## 2021-09-16 MED ORDER — SODIUM CHLORIDE 0.9 % IV SOLN
2.0000 g | INTRAVENOUS | Status: AC
  Administered 2021-09-17: 2 g via INTRAVENOUS
  Filled 2021-09-16: qty 2

## 2021-09-16 MED ORDER — PANTOPRAZOLE SODIUM 40 MG PO TBEC
40.0000 mg | DELAYED_RELEASE_TABLET | Freq: Every day | ORAL | Status: DC
  Administered 2021-09-16 – 2021-09-24 (×9): 40 mg via ORAL
  Filled 2021-09-16 (×9): qty 1

## 2021-09-16 MED ORDER — ENSURE PRE-SURGERY PO LIQD
296.0000 mL | Freq: Once | ORAL | Status: AC
  Administered 2021-09-17: 296 mL via ORAL
  Filled 2021-09-16: qty 296

## 2021-09-16 MED ORDER — ENOXAPARIN SODIUM 40 MG/0.4ML IJ SOSY
40.0000 mg | PREFILLED_SYRINGE | INTRAMUSCULAR | Status: AC
  Administered 2021-09-17: 40 mg via SUBCUTANEOUS
  Filled 2021-09-16: qty 0.4

## 2021-09-16 MED ORDER — METRONIDAZOLE 500 MG PO TABS
1000.0000 mg | ORAL_TABLET | ORAL | Status: AC
  Administered 2021-09-16 (×3): 1000 mg via ORAL
  Filled 2021-09-16 (×3): qty 2

## 2021-09-16 MED ORDER — SODIUM CHLORIDE 0.9 % IV SOLN
100.0000 mg | INTRAVENOUS | Status: DC
  Administered 2021-09-17: 100 mg via INTRAVENOUS
  Filled 2021-09-16: qty 100

## 2021-09-16 MED ORDER — POTASSIUM CHLORIDE 20 MEQ PO PACK
40.0000 meq | PACK | Freq: Two times a day (BID) | ORAL | Status: AC
  Administered 2021-09-16 – 2021-09-18 (×5): 40 meq via ORAL
  Filled 2021-09-16 (×6): qty 2

## 2021-09-16 MED ORDER — GABAPENTIN 300 MG PO CAPS
300.0000 mg | ORAL_CAPSULE | ORAL | Status: AC
  Administered 2021-09-17: 300 mg via ORAL
  Filled 2021-09-16: qty 1

## 2021-09-16 MED ORDER — BUPIVACAINE LIPOSOME 1.3 % IJ SUSP: 20.0000 mL | Freq: Once | INTRAMUSCULAR | Status: DC

## 2021-09-16 MED ORDER — SODIUM CHLORIDE 0.9 % IV SOLN
200.0000 mg | Freq: Once | INTRAVENOUS | Status: AC
  Administered 2021-09-16: 200 mg via INTRAVENOUS
  Filled 2021-09-16: qty 200

## 2021-09-16 MED ORDER — ACETAMINOPHEN 500 MG PO TABS
1000.0000 mg | ORAL_TABLET | ORAL | Status: AC
  Administered 2021-09-17: 1000 mg via ORAL
  Filled 2021-09-16: qty 2

## 2021-09-16 MED ORDER — POLYETHYLENE GLYCOL 3350 17 G PO PACK
34.0000 g | PACK | Freq: Once | ORAL | Status: AC
  Administered 2021-09-16: 34 g via ORAL
  Filled 2021-09-16: qty 2

## 2021-09-16 MED ORDER — NEOMYCIN SULFATE 500 MG PO TABS: 500.0000 mg | ORAL_TABLET | ORAL | Status: DC

## 2021-09-16 MED ORDER — CYCLOBENZAPRINE HCL 10 MG PO TABS
10.0000 mg | ORAL_TABLET | Freq: Every day | ORAL | Status: DC
  Administered 2021-09-16 – 2021-09-18 (×3): 10 mg via ORAL
  Filled 2021-09-16 (×3): qty 1

## 2021-09-16 NOTE — Plan of Care (Signed)

## 2021-09-16 NOTE — Consult Note (Signed)
WOC Nurse ostomy consult note  WOC Nurse requested for preoperative stoma site marking by Dr. Michaell Cowing.  Discussed surgical procedure and stoma creation with patient.  Explained role of the WOC nurse team.  Answered patient and family questions.   Examined patient lying and sitting up in bed in order to place the marking in the patient's visual field, away from any creases or abdominal contour issues and within the rectus muscle. Obese, flaccid abdomen. Reassured patient that if she were to have a stoma that visualization could be attainted by the use of a mirror. The umbilicus is located in a deep crease. Patient is tearful over the possibility of a stoma, reports that family members have had colon cancer as well as diverticulitis.  Is relieved to learn this morning that Dr. Michaell Cowing will attempt to perform surgery robotically.  Marked for colostomy in the LUQ  10cm to the left of the umbilicus and 11cm above the umbilicus.  Marked for ileostomy in the RUQ  6.5cm to the right of the umbilicus and  9.5cm above the umbilicus.   Patient's abdomen cleansed with CHG wipes at site markings, allowed to air dry prior to marking. Covered marks with thin film transparent dressing to preserve mark until date of surgery, tomorrow, 09/17/21.   WOC Nurse team will follow up with patient after surgery for continue ostomy care and teaching should an ostomy be created intraoperatively.   Extended consultation. 75 minutes spent with this patient.  Ladona Mow, MSN, RN, GNP, [facility]  Pager# [phone]

## 2021-09-16 NOTE — Progress Notes (Addendum)
Lori Mcknight [id number] 05/13/1986  CARE TEAM:  PCP: [name], NP  Outpatient Care Team: Patient Care Team: [name], NP as PCP - General (Nurse Practitioner) [name], MD (Inactive) as Consulting Physician (Obstetrics and Gynecology) [name], MD as Consulting Physician (Colon and Rectal Surgery) Ccs, Md, MD as Consulting Physician (General Surgery)  Inpatient Treatment Team: Treatment Team: Attending Provider: Hosie Poisson, MD; Consulting Physician: Nolon Nations, MD; Consulting Physician: [name], MD; Rounding Team: Ian Bushman, MD; Social Worker: Merri Brunette; Social Worker: Iona Beard, [place]; Technician: Levonne Hubert, NT; Registered Nurse: Renato Battles, RN; Technician: Leda Quail, NT; Registered Nurse: Rutherford Guys, RN   Problem List:   Principal Problem:   Diverticulitis of large intestine with abscess Active Problems:   PCOS (polycystic ovarian syndrome)   Essential hypertension   Hypothyroid   Anemia   Obesity, morbid, BMI 50 or higher ([place])      * No surgery found *      Assessment  Persistent diverticulitis with chronic abscess not resolved with antibiotics not amenable to drainage  Delaying surgery due to financial and other issues.  [facility] Stay = 5 days)  Plan:  I think the need for surgery cannot wait until the planned resection next month.  We will see if we have time to do it tomorrow afternoon, Tuesday 09/17/2021 robotically.  Given her BMI, she would be a benefit from a minimally invasive robotic approach.  Minimize extraction incision with risks of hernia and infection.  The anatomy & physiology of the digestive tract was discussed.  The pathophysiology of the colon was discussed.  Natural history risks without surgery was discussed.   I feel the risks of no intervention will lead to serious problems that outweigh the operative risks; therefore, I recommended a partial  colectomy to remove the pathology.  Minimally invasive (Robotic/Laparoscopic) & open techniques were discussed.   Risks such as bleeding, infection, abscess, leak, reoperation, injury to other organs, need for repair of tissues / organs, possible ostomy, hernia, heart attack, stroke, death, and other risks were discussed.  I noted a good likelihood this will help address the problem.   Goals of post-operative recovery were discussed as well.   Need for adequate nutrition, daily bowel regimen and healthy physical activity, to optimize recovery was noted as well. We will work to minimize complications.  Educational materials were available as well.  Questions were answered.  The patient expresses understanding & wishes to proceed with surgery.   We will try and see if urology can do firefly at the start of the case as well. D/w [facility] - Dr Cain Sieve thinks he can be available  Ostomy marking just in case.  This looks like a chronic situation so hopefully I can avoid a two-stage surgery with [facility] [place] but she is definitely increased risk for that.  Pain less and leukocytosis going down since CAT scan 48 hours ago.  However concern.  Continue IV piperacillin/tazobactam.  Add antifungal coverage.  Patient agrees with proceeding with surgery.  We will try and give her oral antibiotics at least.  Because she claims she is moving her bowels we will try and do a gentle bowel regimen as tolerated.  Doubt she will be able to do a full prep.  -VTE prophylaxis- SCDs, etc  -mobilize as tolerated to help recovery  Low potassium.  Correct and check magnesium  Disposition:       35 minutes spent  in review, evaluation, examination, counseling, and coordination of care.   I have reviewed this patient's available data, including medical history, events of note, physical examination and test results as part of my evaluation.  A significant portion of that time was spent in counseling.  Care during the  described time interval was provided by me.  09/16/2021    Subjective: (Chief complaint)  Patient feels a little better with some flatus and bowel movements.  Still with some soreness.  No nausea or vomiting.  Denies any pneumaturia.  Some urgency when bladder is full.  No vaginal drainage.  Objective:  Vital signs:  Vitals:   09/15/21 0231 09/15/21 1045 09/15/21 1935 09/16/21 0350  BP: 132/72 138/83 126/82 129/81  Pulse: 84 99 90 76  Resp: 20 18 20 20   Temp: 98.7 F (37.1 C) (!) 97.4 F (36.3 C) 98.5 F (36.9 C) 98.4 F (36.9 C)  TempSrc: Oral Oral Oral Oral  SpO2: 96% 99% 96% 98%  Weight:      Height:        Last BM Date: 09/15/21  Intake/Output   Yesterday:  12/04 0701 - 12/05 0700 In: 759.8 [I.V.:436.5; IV Piggyback:323.3] Out: -  This shift:  No intake/output data recorded.  Bowel function:  Flatus: YES  BM:  YES  Drain: (No drain)   Physical Exam:  General: Pt awake/alert in no acute distress Eyes: PERRL, normal EOM.  Sclera clear.  No icterus Neuro: CN II-XII intact w/o focal sensory/motor deficits. Lymph: No head/neck/groin lymphadenopathy Psych:  No delerium/psychosis/paranoia.  Oriented x 4 HENT: Normocephalic, Mucus membranes moist.  No thrush Neck: Supple, No tracheal deviation.  No obvious thyromegaly Chest: No pain to chest wall compression.  Good respiratory excursion.  No audible wheezing CV:  Pulses intact.  Regular rhythm.  No major extremity edema MS: Normal AROM mjr joints.  No obvious deformity  Abdomen: Obese w panniculus.   Soft.  Nondistended.  Tenderness at LLQ .  No evidence of peritonitis.  No incarcerated hernias.  Ext:   No deformity.  No mjr edema.  No cyanosis Skin: No petechiae / purpurea.  No major sores.  Warm and dry    Results:   Cultures: Recent Results (from the past 720 hour(s))  Resp Panel by RT-PCR (Flu A&B, Covid) Nasopharyngeal Swab     Status: None   Collection Time: 08/28/21  1:54 PM    Specimen: Nasopharyngeal Swab; Nasopharyngeal(NP) swabs in vial transport medium  Result Value Ref Range Status   SARS Coronavirus 2 by RT PCR NEGATIVE NEGATIVE Final    Comment: (NOTE) SARS-CoV-2 target nucleic acids are NOT DETECTED.  The SARS-CoV-2 RNA is generally detectable in upper respiratory specimens during the acute phase of infection. The lowest concentration of SARS-CoV-2 viral copies this assay can detect is 138 copies/mL. A negative result does not preclude SARS-Cov-2 infection and should not be used as the sole basis for treatment or other patient management decisions. A negative result may occur with  improper specimen collection/handling, submission of specimen other than nasopharyngeal swab, presence of viral mutation(s) within the areas targeted by this assay, and inadequate number of viral copies(<138 copies/mL). A negative result must be combined with clinical observations, patient history, and epidemiological information. The expected result is Negative.  Fact Sheet for Patients:  EntrepreneurPulse.com.au  Fact Sheet for Healthcare Providers:  IncredibleEmployment.be  This test is no t yet approved or cleared by the [place] FDA and  has been authorized for detection and/or diagnosis of  SARS-CoV-2 by FDA under an Emergency Use Authorization (EUA). This EUA will remain  in effect (meaning this test can be used) for the duration of the COVID-19 declaration under Section 564(b)(1) of the Act, 21 U.S.C.section 360bbb-3(b)(1), unless the authorization is terminated  or revoked sooner.       Influenza A by PCR NEGATIVE NEGATIVE Final   Influenza B by PCR NEGATIVE NEGATIVE Final    Comment: (NOTE) The Xpert Xpress SARS-CoV-2/FLU/RSV plus assay is intended as an aid in the diagnosis of influenza from Nasopharyngeal swab specimens and should not be used as a sole basis for treatment. Nasal washings and aspirates are  unacceptable for Xpert Xpress SARS-CoV-2/FLU/RSV testing.  Fact Sheet for Patients: EntrepreneurPulse.com.au  Fact Sheet for Healthcare Providers: IncredibleEmployment.be  This test is not yet approved or cleared by the [place] FDA and has been authorized for detection and/or diagnosis of SARS-CoV-2 by FDA under an Emergency Use Authorization (EUA). This EUA will remain in effect (meaning this test can be used) for the duration of the COVID-19 declaration under Section 564(b)(1) of the Act, 21 U.S.C. section 360bbb-3(b)(1), unless the authorization is terminated or revoked.  Performed at [facility], [address], [place], [place] [address]   Blood culture (routine x 2)     Status: Abnormal   Collection Time: 09/11/21  6:42 AM   Specimen: BLOOD  Result Value Ref Range Status   Specimen Description   Final    BLOOD BLOOD LEFT FOREARM Performed at [facility], [address] [place], [place] [address]    Special Requests   Final    Blood Culture adequate volume BOTTLES DRAWN AEROBIC AND ANAEROBIC Performed at [facility], [place], [place] [address]    Culture  Setup Time   Final    GRAM POSITIVE COCCI IN BOTH AEROBIC AND ANAEROBIC BOTTLES CRITICAL RESULT CALLED TO, READ BACK BY AND VERIFIED WITH: PHARMD K.SHADE AT 1213 ON 09/12/2021 BY T.SAAD.    Culture (A)  Final    STAPHYLOCOCCUS EPIDERMIDIS THE SIGNIFICANCE OF ISOLATING THIS ORGANISM FROM A SINGLE VENIPUNCTURE CANNOT BE PREDICTED WITHOUT FURTHER CLINICAL AND CULTURE CORRELATION. SUSCEPTIBILITIES AVAILABLE ONLY ON REQUEST. Performed at [facility], [place] [address] [place], [place] [address]    Report Status 09/14/2021 FINAL  Final  Blood Culture ID Panel (Reflexed)     Status: Abnormal   Collection Time: 09/11/21  6:42 AM  Result Value Ref Range Status   Enterococcus faecalis NOT DETECTED NOT  DETECTED Final   Enterococcus Faecium NOT DETECTED NOT DETECTED Final   Listeria monocytogenes NOT DETECTED NOT DETECTED Final   Staphylococcus species DETECTED (A) NOT DETECTED Final    Comment: CRITICAL RESULT CALLED TO, READ BACK BY AND VERIFIED WITH: PHARMD K.SHADE AT 1213 ON 09/12/2021 BY T.SAAD.    Staphylococcus aureus (BCID) NOT DETECTED NOT DETECTED Final   Staphylococcus epidermidis DETECTED (A) NOT DETECTED Final    Comment: CRITICAL RESULT CALLED TO, READ BACK BY AND VERIFIED WITH: PHARMD K.SHADE AT 1213 ON 09/12/2021 BY T.SAAD.    Staphylococcus lugdunensis NOT DETECTED NOT DETECTED Final   Streptococcus species NOT DETECTED NOT DETECTED Final   Streptococcus agalactiae NOT DETECTED NOT DETECTED Final   Streptococcus pneumoniae NOT DETECTED NOT DETECTED Final   Streptococcus pyogenes NOT DETECTED NOT DETECTED Final   A.calcoaceticus-baumannii NOT DETECTED NOT DETECTED Final   Bacteroides fragilis NOT DETECTED NOT DETECTED Final   Enterobacterales NOT DETECTED NOT DETECTED Final   Enterobacter cloacae  complex NOT DETECTED NOT DETECTED Final   Escherichia coli NOT DETECTED NOT DETECTED Final   Klebsiella aerogenes NOT DETECTED NOT DETECTED Final   Klebsiella oxytoca NOT DETECTED NOT DETECTED Final   Klebsiella pneumoniae NOT DETECTED NOT DETECTED Final   Proteus species NOT DETECTED NOT DETECTED Final   Salmonella species NOT DETECTED NOT DETECTED Final   Serratia marcescens NOT DETECTED NOT DETECTED Final   Haemophilus influenzae NOT DETECTED NOT DETECTED Final   Neisseria meningitidis NOT DETECTED NOT DETECTED Final   Pseudomonas aeruginosa NOT DETECTED NOT DETECTED Final   Stenotrophomonas maltophilia NOT DETECTED NOT DETECTED Final   Candida albicans NOT DETECTED NOT DETECTED Final   Candida auris NOT DETECTED NOT DETECTED Final   Candida glabrata NOT DETECTED NOT DETECTED Final   Candida krusei NOT DETECTED NOT DETECTED Final   Candida parapsilosis NOT DETECTED NOT  DETECTED Final   Candida tropicalis NOT DETECTED NOT DETECTED Final   Cryptococcus neoformans/gattii NOT DETECTED NOT DETECTED Final   Methicillin resistance mecA/C NOT DETECTED NOT DETECTED Final    Comment: Performed at [facility], [address], [place], [place] [address]  Resp Panel by RT-PCR (Flu A&B, Covid) Nasopharyngeal Swab     Status: None   Collection Time: 09/11/21 10:45 AM   Specimen: Nasopharyngeal Swab; Nasopharyngeal(NP) swabs in vial transport medium  Result Value Ref Range Status   SARS Coronavirus 2 by RT PCR NEGATIVE NEGATIVE Final    Comment: (NOTE) SARS-CoV-2 target nucleic acids are NOT DETECTED.  The SARS-CoV-2 RNA is generally detectable in upper respiratory specimens during the acute phase of infection. The lowest concentration of SARS-CoV-2 viral copies this assay can detect is 138 copies/mL. A negative result does not preclude SARS-Cov-2 infection and should not be used as the sole basis for treatment or other patient management decisions. A negative result may occur with  improper specimen collection/handling, submission of specimen other than nasopharyngeal swab, presence of viral mutation(s) within the areas targeted by this assay, and inadequate number of viral copies(<138 copies/mL). A negative result must be combined with clinical observations, patient history, and epidemiological information. The expected result is Negative.  Fact Sheet for Patients:  BloggerCourse.com  Fact Sheet for Healthcare Providers:  SeriousBroker.it  This test is no t yet approved or cleared by the [place] FDA and  has been authorized for detection and/or diagnosis of SARS-CoV-2 by FDA under an Emergency Use Authorization (EUA). This EUA will remain  in effect (meaning this test can be used) for the duration of the COVID-19 declaration under Section 564(b)(1) of the Act, 21 U.S.C.section 360bbb-3(b)(1),  unless the authorization is terminated  or revoked sooner.       Influenza A by PCR NEGATIVE NEGATIVE Final   Influenza B by PCR NEGATIVE NEGATIVE Final    Comment: (NOTE) The Xpert Xpress SARS-CoV-2/FLU/RSV plus assay is intended as an aid in the diagnosis of influenza from Nasopharyngeal swab specimens and should not be used as a sole basis for treatment. Nasal washings and aspirates are unacceptable for Xpert Xpress SARS-CoV-2/FLU/RSV testing.  Fact Sheet for Patients: BloggerCourse.com  Fact Sheet for Healthcare Providers: SeriousBroker.it  This test is not yet approved or cleared by the [place] FDA and has been authorized for detection and/or diagnosis of SARS-CoV-2 by FDA under an Emergency Use Authorization (EUA). This EUA will remain in effect (meaning this test can be used) for the duration of the COVID-19 declaration under Section 564(b)(1) of the Act, 21 U.S.C. section 360bbb-3(b)(1), unless  the authorization is terminated or revoked.  Performed at [facility], [address], [place], [place] [address]   Blood culture (routine x 2)     Status: Abnormal   Collection Time: 09/11/21  3:52 PM   Specimen: BLOOD RIGHT HAND  Result Value Ref Range Status   Specimen Description   Final    BLOOD RIGHT HAND Performed at [facility] [address] [place], [place] [address]    Special Requests   Final    BOTTLES DRAWN AEROBIC AND ANAEROBIC Blood Culture adequate volume Performed at [facility] [address] [place], [place] [address]    Culture  Setup Time   Final    GRAM POSITIVE COCCI IN CLUSTERS ANAEROBIC BOTTLE ONLY CRITICAL RESULT CALLED TO, READ BACK BY AND VERIFIED WITH: John Day, AT [id number] 09/13/21 D. VANHOOK    Culture (A)  Final    STAPHYLOCOCCUS HOMINIS THE SIGNIFICANCE OF ISOLATING THIS ORGANISM FROM A SINGLE SET OF BLOOD  CULTURES WHEN MULTIPLE SETS ARE DRAWN IS UNCERTAIN. PLEASE NOTIFY THE MICROBIOLOGY DEPARTMENT WITHIN ONE WEEK IF SPECIATION AND SENSITIVITIES ARE REQUIRED. Performed at [facility], [place] [address], [place], [place] [address]    Report Status 09/15/2021 FINAL  Final  Culture, blood (Routine X 2) w Reflex to ID Panel     Status: None (Preliminary result)   Collection Time: 09/11/21  5:14 PM   Specimen: BLOOD  Result Value Ref Range Status   Specimen Description   Final    BLOOD BLOOD LEFT HAND Performed at [facility] [address] [place], [place] [address]    Special Requests   Final    BOTTLES DRAWN AEROBIC ONLY Blood Culture results may not be optimal due to an inadequate volume of blood received in culture bottles Performed at [facility] [address] [place], [place] [address]    Culture   Final    NO GROWTH 3 DAYS Performed at [facility], [place] [address] [place], [place] [address]    Report Status PENDING  Incomplete  Culture, blood (Routine X 2) w Reflex to ID Panel     Status: None (Preliminary result)   Collection Time: 09/14/21 12:40 PM   Specimen: BLOOD  Result Value Ref Range Status   Specimen Description   Final    BLOOD RIGHT ANTECUBITAL Performed at [facility] [address] [place], [place] [address]    Special Requests   Final    BOTTLES DRAWN AEROBIC ONLY Blood Culture adequate volume Performed at [facility] [address] [place], [place] [address]    Culture   Final    NO GROWTH < 24 HOURS Performed at [facility] [address], [place], [place] [address]    Report Status PENDING  Incomplete  Culture, blood (Routine X 2) w Reflex to ID Panel     Status: None (Preliminary result)   Collection Time: 09/14/21 12:40 PM   Specimen: BLOOD  Result Value Ref Range Status   Specimen Description   Final    BLOOD BLOOD LEFT HAND Performed at [facility] [address], [place], [place] [address]    Special Requests   Final    BOTTLES DRAWN AEROBIC ONLY Blood Culture adequate volume Performed at [facility] [address], [place], [place] [address]    Culture   Final  NO GROWTH < 24 HOURS Performed at [facility] [address] [place], [place] [address]    Report Status PENDING  Incomplete    Labs: Results for orders placed or performed during the hospital encounter of 09/11/21 (from the past 48 hour(s))  Culture, blood (Routine X 2) w Reflex to ID Panel     Status: None (Preliminary result)   Collection Time: 09/14/21 12:40 PM   Specimen: BLOOD  Result Value Ref Range   Specimen Description      BLOOD RIGHT ANTECUBITAL Performed at [facility], [place] [address] [place], [place] [address]    Special Requests      BOTTLES DRAWN AEROBIC ONLY Blood Culture adequate volume Performed at [facility] [address], [place], [place] [address]    Culture      NO GROWTH < 24 HOURS Performed at [facility] [address] [place], [place] [address]    Report Status PENDING   Culture, blood (Routine X 2) w Reflex to ID Panel     Status: None (Preliminary result)   Collection Time: 09/14/21 12:40 PM   Specimen: BLOOD  Result Value Ref Range   Specimen Description      BLOOD BLOOD LEFT HAND Performed at [facility] [address], [place], [place] [address]    Special Requests      BOTTLES DRAWN AEROBIC ONLY Blood Culture adequate volume Performed at [facility] [address], [place], [place] [address]    Culture      NO GROWTH < 24 HOURS Performed at [facility] [address], [place], [place] [address]    Report Status PENDING   CBC     Status: Abnormal   Collection Time: 09/15/21  5:33 AM  Result Value Ref Range   WBC 10.9 (H) 4.0 - 10.5 K/uL   RBC 4.62 3.87 - 5.11  MIL/uL   Hemoglobin 14.6 12.0 - 15.0 g/dL   HCT 44.0 36.0 - 46.0 %   MCV 95.2 80.0 - 100.0 fL   MCH 31.6 26.0 - 34.0 pg   MCHC 33.2 30.0 - 36.0 g/dL   RDW 12.3 11.5 - 15.5 %   Platelets 197 150 - 400 K/uL   nRBC 0.0 0.0 - 0.2 %    Comment: Performed at [facility], [place] [address], [place], [place] [id number]  Basic metabolic panel     Status: Abnormal   Collection Time: 09/15/21  5:33 AM  Result Value Ref Range   Sodium 133 (L) 135 - 145 mmol/L   Potassium 3.4 (L) 3.5 - 5.1 mmol/L   Chloride 103 98 - 111 mmol/L   CO2 25 22 - 32 mmol/L   Glucose, Bld 85 70 - 99 mg/dL    Comment: Glucose reference range applies only to samples taken after fasting for at least 8 hours.   BUN 5 (L) 6 - 20 mg/dL   Creatinine, Ser 0.81 0.44 - 1.00 mg/dL   Calcium 8.4 (L) 8.9 - 10.3 mg/dL   GFR, Estimated >60 >60 mL/min    Comment: (NOTE) Calculated using the CKD-EPI Creatinine Equation (2021)    Anion gap 5 5 - 15    Comment: Performed at [facility], [place] [address] [place], [place] [address]    Imaging / Studies: CT ABDOMEN PELVIS W CONTRAST  Addendum Date: 09/14/2021   ADDENDUM REPORT: 09/14/2021 16:48 ADDENDUM: These results were  called by telephone at the time of interpretation on 09/14/2021 at 4:44 pm to provider Hosie Poisson MD, who verbally acknowledged these results. Electronically Signed   By: Iven Finn M.D.   On: 09/14/2021 16:48   Result Date: 09/14/2021 CLINICAL DATA:  Diverticulitis. F/u abscess after iv antibiotics Best imaging due to body habitus EXAM: CT ABDOMEN AND PELVIS WITH CONTRAST TECHNIQUE: Multidetector CT imaging of the abdomen and pelvis was performed using the standard protocol following bolus administration of intravenous contrast. CONTRAST:  110mL OMNIPAQUE IOHEXOL 350 MG/ML SOLN COMPARISON:  CT abdomen pelvis 08/11/2021, CT abdomen pelvis 05/20/2021, CT abdomen pelvis 05/09/2021, CT abdomen pelvis 03/26/2020 FINDINGS: Lower chest:  Interval development of a trace right pleural effusion. Associated passive atelectasis of right lower lobe. Bibasilar subsegmental atelectasis. Hepatobiliary: No focal liver abnormality. No gallstones, gallbladder wall thickening, or pericholecystic fluid. No biliary dilatation. Pancreas: No focal lesion. Normal pancreatic contour. No surrounding inflammatory changes. No main pancreatic ductal dilatation. Spleen: Normal in size without focal abnormality. Adrenals/Urinary Tract: No adrenal nodule bilaterally. Bilateral kidneys enhance symmetrically. No hydronephrosis. No hydroureter. The urinary bladder is unremarkable. No gas noted within the urinary bladder lumen or wall. Stomach/Bowel: Stomach is within normal limits. No evidence of bowel wall thickening or dilatation. Persistent marked bowel wall thickening and pericolonic fat stranding of the mid sigmoid colon in the setting of colonic diverticulosis. Associated perforation is again noted (2:71). Appendix appears normal. Vascular/Lymphatic: No abdominal aorta or iliac aneurysm. Mild atherosclerotic plaque of the aorta and its branches. Persistent prominent/borderline enlarged left retroperitoneal lymph nodes measuring up to 1.2 cm. No pelvic or inguinal lymphadenopathy. Reproductive: Uterus and bilateral adnexa are unremarkable. Other: Interval increase in of small volume free intraperitoneal gas. Interval increase in trace free fluid within the pelvis. Interval development of a region of free fluid and gas along the known segment of mid sigmoid diverticulitis likely representing a developing organized fluid collection/abscess. Interval increase in size of an abscess formation along the urinary bladder dome that now measures measures 1.9 cm (from 1.1 cm). Musculoskeletal: No abdominal wall hernia or abnormality. No suspicious lytic or blastic osseous lesions. No acute displaced fracture. Degenerative changes of the spine with intervertebral disc space vacuum  phenomenon, posterior disc osteophyte complex, mild endplate sclerosis, osteophyte formation at the L5-S1 level. IMPRESSION: 1. Interval worsening of disease in a patient with complicated sigmoid diverticulitis. 2. Interval increase in small volume pneumoperitoneum consistent with perforation. 3. Interval development of a region of free fluid and gas along the known segment of mid sigmoid diverticulitis likely representing a developing organized fluid collection/abscess. Please consider use of PO and IV contrast in following CT scans in this patient. 4. Interval increase in size of a 1.9 cm (from 1.1 cm) abscess along the urinary bladder dome. 5. Persistent prominent/borderline enlarged left retroperitoneal lymph nodes measuring up to 1.2 cm. Findings may be reactive in etiology. Recommend attention on follow-up. 6. Recommend colonoscopy status post treatment and status post complete resolution of inflammatory changes to exclude an underlying lesion. 7. Interval development of trace right pleural effusion. Electronically Signed: By: Iven Finn M.D. On: 09/14/2021 16:44   ECHOCARDIOGRAM COMPLETE  Result Date: 09/14/2021    ECHOCARDIOGRAM REPORT   Patient Name:   Lori Mcknight Date of Exam: 09/14/2021 Medical Rec #:  [id number]    Height:       66.0 in Accession #:    [id number]   Weight:       360.0 lb Date of Birth:  1986/01/03  BSA:          2.567 m Patient Age:    35 years     BP:           136/80 mmHg Patient Gender: F            HR:           94 bpm. Exam Location:  Inpatient Procedure: 2D Echo, Cardiac Doppler, Color Doppler and Intracardiac            Opacification Agent Indications:    Bacteremia  History:        Patient has no prior history of Echocardiogram examinations.                 Signs/Symptoms:Shortness of Breath; Risk Factors:Hypertension.  Sonographer:    Bernadene Person RDCS Referring Phys: Theola Sequin  Sonographer Comments: Patient is morbidly obese. IMPRESSIONS  1. Technically  difficult echo with poor image quality.  2. Left ventricular ejection fraction, by estimation, is 65 to 70%. The left ventricle has normal function. The left ventricle has no regional wall motion abnormalities. Left ventricular diastolic parameters were normal.  3. Right ventricular systolic function is normal. The right ventricular size is normal.  4. The mitral valve was not well visualized. Trivial mitral valve regurgitation.  5. The aortic valve was not well visualized. Aortic valve regurgitation is not visualized. FINDINGS  Left Ventricle: Left ventricular ejection fraction, by estimation, is 65 to 70%. The left ventricle has normal function. The left ventricle has no regional wall motion abnormalities. Definity contrast agent was given IV to delineate the left ventricular  endocardial borders. The left ventricular internal cavity size was small. There is no left ventricular hypertrophy. Left ventricular diastolic parameters were normal. Right Ventricle: The right ventricular size is normal. Right vetricular wall thickness was not well visualized. Right ventricular systolic function is normal. Left Atrium: Left atrial size was normal in size. Right Atrium: Right atrial size was normal in size. Pericardium: There is no evidence of pericardial effusion. Mitral Valve: The mitral valve was not well visualized. Trivial mitral valve regurgitation. Tricuspid Valve: The tricuspid valve is not well visualized. Tricuspid valve regurgitation is not demonstrated. Aortic Valve: The aortic valve was not well visualized. Aortic valve regurgitation is not visualized. Pulmonic Valve: The pulmonic valve was not well visualized. Pulmonic valve regurgitation is not visualized. Aorta: The aortic root and ascending aorta are structurally normal, with no evidence of dilitation. IAS/Shunts: The interatrial septum was not well visualized. Additional Comments: Technically difficult echo with poor image quality.  LEFT VENTRICLE PLAX 2D  LVIDd:         4.00 cm   Diastology LVIDs:         2.60 cm   LV e' medial:    7.51 cm/s LV PW:         1.10 cm   LV E/e' medial:  9.8 LV IVS:        1.10 cm   LV e' lateral:   9.79 cm/s LVOT diam:     2.10 cm   LV E/e' lateral: 7.5 LV SV:         47 LV SV Index:   18 LVOT Area:     3.46 cm  RIGHT VENTRICLE RV S prime:     13.50 cm/s TAPSE (M-mode): 1.8 cm LEFT ATRIUM           Index        RIGHT ATRIUM  Index LA diam:      3.60 cm 1.40 cm/m   RA Area:     20.70 cm LA Vol (A4C): 58.5 ml 22.79 ml/m  RA Volume:   63.20 ml  24.62 ml/m  AORTIC VALVE LVOT Vmax:   92.10 cm/s LVOT Vmean:  58.700 cm/s LVOT VTI:    0.137 m  AORTA Ao Root diam: 3.20 cm Ao Asc diam:  3.30 cm MITRAL VALVE MV Area (PHT): 5.75 cm    SHUNTS MV Decel Time: 132 msec    Systemic VTI:  0.14 m MV E velocity: 73.70 cm/s  Systemic Diam: 2.10 cm MV A velocity: 75.00 cm/s MV E/A ratio:  0.98 Mertie Moores MD Electronically signed by Mertie Moores MD Signature Date/Time: 09/14/2021/3:23:52 PM    Final     Medications / Allergies: per chart  Antibiotics: Anti-infectives (From admission, onward)    Start     Dose/Rate Route Frequency Ordered Stop   09/11/21 1600  piperacillin-tazobactam (ZOSYN) IVPB 3.375 g        3.375 g 12.5 mL/hr over 240 Minutes Intravenous Every 8 hours 09/11/21 1538     09/11/21 0645  piperacillin-tazobactam (ZOSYN) IVPB 3.375 g        3.375 g 100 mL/hr over 30 Minutes Intravenous  Once 09/11/21 [id number] 09/11/21 0729         Note: Portions of this report may have been transcribed using voice recognition software. Every effort was made to ensure accuracy; however, inadvertent computerized transcription errors may be present.   Any transcriptional errors that result from this process are unintentional.    [name], MD, FACS, MASCRS Esophageal, Gastrointestinal & Colorectal Surgery Robotic and Minimally Invasive Surgery  [facility], [place]   [place]. [address] [place], [place] [address] [phone] Fax [phone] Main  CONTACT INFORMATION:  Weekday (9AM-5PM): Call [facility] main office at [phone]  Weeknight (5PM-9AM) or Weekend/Holiday: Check [email or web address] (password " [email or web address]") for General Surgery [facility] coverage  (Please, do not use SecureChat as it is not reliable communication to operating surgeons for immediate patient care)      09/16/2021  7:57 AM

## 2021-09-16 NOTE — Plan of Care (Signed)
Pt aox4, pleasant and cooperative with staff. Up ad lib to bathroom.  Plan for colectomy with surgery tomorrow.  Clear liquids and bowl prep today.  IVF/IV Abx per orders.  Pain management per orders.   Problem: Safety: Goal: Ability to remain free from injury will improve Outcome: Progressing   Problem: Pain Managment: Goal: General experience of comfort will improve Outcome: Progressing   Problem: Elimination: Goal: Will not experience complications related to bowel motility Outcome: Progressing   Problem: Coping: Goal: Level of anxiety will decrease Outcome: Progressing   Problem: Nutrition: Goal: Adequate nutrition will be maintained Outcome: Progressing

## 2021-09-16 NOTE — Progress Notes (Signed)
PROGRESS NOTE    Lori C Westerhoff  ZOX:[id number] DOB: [birth year] DOA: 09/11/2021 PCP: Claiborne Rigg, NP    Chief Complaint  Patient presents with   Abdominal Pain    Brief Narrative:   The patient is a 35 yr old woman who has had multiple recurrences of diverticulitis with abscess formation, depression anxiety, anemia, hypertension, hypothyroidism, PCOS, super morbid obesity presents this time with severe abdominal pain.  CT of the abdomen and pelvis demonstrated increase in the size of the abscess.  She was started on IV Zosyn and general surgery consulted. General surgery recommended IV antibiotics and clear sips.   Assessment & Plan:   Principal Problem:   Diverticulitis of large intestine with abscess Active Problems:   PCOS (polycystic ovarian syndrome)   Essential hypertension   Hypothyroid   Anemia   Obesity, morbid, BMI 50 or higher (HCC)   Family history of colon cancer in father   Asthma, mild intermittent   Acute diverticulitis of the large intestine with abscess formation This is a chronic and recurrent issue for the patient Dr Gerri Lins has discussed with Dr. Cliffton Asters suggested to the abscess has increased in size  but it remains too small for percutaneous drainage.  Furthermore given the patient's body habitus there is high risk for complications and having the drain being able to reach the skin.  Does not recommend an open procedure at this time due to acute infection and status complications.  General surgery recommends treatment with IV antibiotics and watchful observation.  As the abscess appears to be sitting between the sigmoid colon and the bladder there is likelihood of fistula formation allowing for natural drainage of the abscess.     Patient continues to worsen with worsening pneumoperitoneum and interval development of free fluid and gas along the known segment of mid sigmoid diverticulitis.  Patient currently is on IV Zosyn continue the same,. General  surgery planning for surgical intervention robotically.   continue IV fluids.  Blood cultures were positive for staph epidermidis ? Contamination vs infection.    Staph epidermidis bacteremia ? Contaminant, repeat blood cultures pending so far.  Continue with IV Zosyn for now.   Morbid obesity Body mass index is 58.11 kg/m. Increased risk of mortality and morbidity.    Essential hypertension -Blood pressure parameters are optimal.     Hypothyroidism Continue with Synthroid.   Anxiety and depression Continue with home medications.   History of PCOS Patient will probably need to be on metformin on discharge.   Chest tightness  Resolved.  EKG NSR.  Continue to monitor.    Hypokalemia:  Replaced. Recheck in am.   Leukocytosis improving.    DVT prophylaxis: (Lovenox/) Code Status: (Full code) Family Communication: no family at bedside.  Disposition:   Status is: Inpatient  Remains inpatient appropriate because: IV antibiotics.        Consultants:  Surgery.   Procedures: none.   Antimicrobials:  Antibiotics Given (last 72 hours)     Date/Time Action Medication Dose Rate   09/13/21 1644 New Bag/Given   piperacillin-tazobactam (ZOSYN) IVPB 3.375 g 3.375 g 12.5 mL/hr   09/14/21 0016 New Bag/Given   piperacillin-tazobactam (ZOSYN) IVPB 3.375 g 3.375 g 12.5 mL/hr   09/14/21 0828 New Bag/Given   piperacillin-tazobactam (ZOSYN) IVPB 3.375 g 3.375 g 12.5 mL/hr   09/14/21 1836 New Bag/Given   piperacillin-tazobactam (ZOSYN) IVPB 3.375 g 3.375 g 12.5 mL/hr   09/14/21 2350 New Bag/Given   piperacillin-tazobactam (ZOSYN) IVPB 3.375 g 3.375  g 12.5 mL/hr   09/15/21 0852 New Bag/Given   piperacillin-tazobactam (ZOSYN) IVPB 3.375 g 3.375 g 12.5 mL/hr   09/15/21 1605 New Bag/Given   piperacillin-tazobactam (ZOSYN) IVPB 3.375 g 3.375 g 12.5 mL/hr   09/16/21 0036 New Bag/Given   piperacillin-tazobactam (ZOSYN) IVPB 3.375 g 3.375 g 12.5 mL/hr   09/16/21 0802  New Bag/Given   piperacillin-tazobactam (ZOSYN) IVPB 3.375 g 3.375 g 12.5 mL/hr         Subjective: Pain better . Having bowel movements.   Objective: Vitals:   09/15/21 0231 09/15/21 1045 09/15/21 1935 09/16/21 0350  BP: 132/72 138/83 126/82 129/81  Pulse: 84 99 90 76  Resp: Temp: 98.7 F (37.1 C) (!) 97.4 F (36.3 C) 98.5 F (36.9 C) 98.4 F (36.9 C)  TempSrc: Oral Oral Oral Oral  SpO2: 96% 99% 96% 98%  Weight:      Height:        Intake/Output Summary (Last 24 hours) at 09/16/2021 1239 Last data filed at 09/16/2021 1610 Gross per 24 hour  Intake 759.79 ml  Output --  Net 759.79 ml    Filed Weights   09/11/21 0303  Weight: (!) 163.3 kg    Examination:   General exam: Morbidly obese lady not in distress.  Respiratory system: Clear to auscultation. Respiratory effort normal. Cardiovascular system: S1 & S2 heard, RRR. No JVD,  No pedal edema. Gastrointestinal system: Abdomen is soft, generalized tenderness.  Normal bowel sounds heard. Central nervous system: Alert and oriented. No focal neurological deficits. Extremities: Symmetric 5 x 5 power. Skin: No rashes, lesions or ulcers Psychiatry: Mood & affect appropriate.         Data Reviewed: I have personally reviewed following labs and imaging studies  CBC: Recent Labs  Lab 09/11/21 0400 09/13/21 0539 09/14/21 0748 09/15/21 0533  WBC 5.4 10.3 14.1* 10.9*  NEUTROABS 3.3  --   --   --   HGB 14.8 14.7 15.0 14.6  HCT 45.4 43.3 45.8 44.0  MCV 95.8 95.2 96.4 95.2  PLT 203 154 186 197     Basic Metabolic Panel: Recent Labs  Lab 09/11/21 0400 09/13/21 0539 09/15/21 0533 09/16/21 0850  NA 139 133* 133*  --   K 3.5 3.4* 3.4*  --   CL 105 101 103  --   CO2 --   GLUCOSE 113* 91 85  --   BUN 9 8 5*  --   CREATININE 0.75 0.77 0.81  --   CALCIUM 9.5 8.4* 8.4*  --   MG  --   --   --  1.9  PHOS  --   --   --  2.9     GFR: Estimated Creatinine Clearance: 152.9 mL/min  (by C-G formula based on SCr of 0.81 mg/dL).  Liver Function Tests: Recent Labs  Lab 09/11/21 0400  AST 16  ALT 17  ALKPHOS 64  BILITOT 0.7  PROT 7.7  ALBUMIN 4.1     CBG: No results for input(s): GLUCAP in the last 168 hours.   Recent Results (from the past 240 hour(s))  Blood culture (routine x 2)     Status: Abnormal   Collection Time: 09/11/21  6:42 AM   Specimen: BLOOD  Result Value Ref Range Status   Specimen Description   Final    BLOOD BLOOD LEFT FOREARM Performed at [facility], [address] [place], [place] [address]    Special Requests  Final    Blood Culture adequate volume BOTTLES DRAWN AEROBIC AND ANAEROBIC Performed at [facility], [address] [place], [place] [address]    Culture  Setup Time   Final    GRAM POSITIVE COCCI IN BOTH AEROBIC AND ANAEROBIC BOTTLES CRITICAL RESULT CALLED TO, READ BACK BY AND VERIFIED WITH: PHARMD K.SHADE AT 1213 ON 09/12/2021 BY T.SAAD.    Culture (A)  Final    STAPHYLOCOCCUS EPIDERMIDIS THE SIGNIFICANCE OF ISOLATING THIS ORGANISM FROM A SINGLE VENIPUNCTURE CANNOT BE PREDICTED WITHOUT FURTHER CLINICAL AND CULTURE CORRELATION. SUSCEPTIBILITIES AVAILABLE ONLY ON REQUEST. Performed at [facility], [address] [place], [place] [address]    Report Status 09/14/2021 FINAL  Final  Blood Culture ID Panel (Reflexed)     Status: Abnormal   Collection Time: 09/11/21  6:42 AM  Result Value Ref Range Status   Enterococcus faecalis NOT DETECTED NOT DETECTED Final   Enterococcus Faecium NOT DETECTED NOT DETECTED Final   Listeria monocytogenes NOT DETECTED NOT DETECTED Final   Staphylococcus species DETECTED (A) NOT DETECTED Final    Comment: CRITICAL RESULT CALLED TO, READ BACK BY AND VERIFIED WITH: PHARMD K.SHADE AT 1213 ON 09/12/2021 BY T.SAAD.    Staphylococcus aureus (BCID) NOT DETECTED NOT DETECTED Final   Staphylococcus epidermidis DETECTED (A) NOT DETECTED Final     Comment: CRITICAL RESULT CALLED TO, READ BACK BY AND VERIFIED WITH: PHARMD K.SHADE AT 1213 ON 09/12/2021 BY T.SAAD.    Staphylococcus lugdunensis NOT DETECTED NOT DETECTED Final   Streptococcus species NOT DETECTED NOT DETECTED Final   Streptococcus agalactiae NOT DETECTED NOT DETECTED Final   Streptococcus pneumoniae NOT DETECTED NOT DETECTED Final   Streptococcus pyogenes NOT DETECTED NOT DETECTED Final   A.calcoaceticus-baumannii NOT DETECTED NOT DETECTED Final   Bacteroides fragilis NOT DETECTED NOT DETECTED Final   Enterobacterales NOT DETECTED NOT DETECTED Final   Enterobacter cloacae complex NOT DETECTED NOT DETECTED Final   Escherichia coli NOT DETECTED NOT DETECTED Final   Klebsiella aerogenes NOT DETECTED NOT DETECTED Final   Klebsiella oxytoca NOT DETECTED NOT DETECTED Final   Klebsiella pneumoniae NOT DETECTED NOT DETECTED Final   Proteus species NOT DETECTED NOT DETECTED Final   Salmonella species NOT DETECTED NOT DETECTED Final   Serratia marcescens NOT DETECTED NOT DETECTED Final   Haemophilus influenzae NOT DETECTED NOT DETECTED Final   Neisseria meningitidis NOT DETECTED NOT DETECTED Final   Pseudomonas aeruginosa NOT DETECTED NOT DETECTED Final   Stenotrophomonas maltophilia NOT DETECTED NOT DETECTED Final   Candida albicans NOT DETECTED NOT DETECTED Final   Candida auris NOT DETECTED NOT DETECTED Final   Candida glabrata NOT DETECTED NOT DETECTED Final   Candida krusei NOT DETECTED NOT DETECTED Final   Candida parapsilosis NOT DETECTED NOT DETECTED Final   Candida tropicalis NOT DETECTED NOT DETECTED Final   Cryptococcus neoformans/gattii NOT DETECTED NOT DETECTED Final   Methicillin resistance mecA/C NOT DETECTED NOT DETECTED Final    Comment: Performed at [facility], [address], [place], [place] [address]  Resp Panel by RT-PCR (Flu A&B, Covid) Nasopharyngeal Swab     Status: None   Collection Time: 09/11/21 10:45 AM   Specimen: Nasopharyngeal Swab;  Nasopharyngeal(NP) swabs in vial transport medium  Result Value Ref Range Status   SARS Coronavirus 2 by RT PCR NEGATIVE NEGATIVE Final    Comment: (NOTE) SARS-CoV-2 target nucleic acids are NOT DETECTED.  The SARS-CoV-2 RNA is generally detectable in upper respiratory specimens during the acute phase of  infection. The lowest concentration of SARS-CoV-2 viral copies this assay can detect is 138 copies/mL. A negative result does not preclude SARS-Cov-2 infection and should not be used as the sole basis for treatment or other patient management decisions. A negative result may occur with  improper specimen collection/handling, submission of specimen other than nasopharyngeal swab, presence of viral mutation(s) within the areas targeted by this assay, and inadequate number of viral copies(<138 copies/mL). A negative result must be combined with clinical observations, patient history, and epidemiological information. The expected result is Negative.  Fact Sheet for Patients:  BloggerCourse.com  Fact Sheet for Healthcare Providers:  SeriousBroker.it  This test is no t yet approved or cleared by the [place] FDA and  has been authorized for detection and/or diagnosis of SARS-CoV-2 by FDA under an Emergency Use Authorization (EUA). This EUA will remain  in effect (meaning this test can be used) for the duration of the COVID-19 declaration under Section 564(b)(1) of the Act, 21 U.S.C.section 360bbb-3(b)(1), unless the authorization is terminated  or revoked sooner.       Influenza A by PCR NEGATIVE NEGATIVE Final   Influenza B by PCR NEGATIVE NEGATIVE Final    Comment: (NOTE) The Xpert Xpress SARS-CoV-2/FLU/RSV plus assay is intended as an aid in the diagnosis of influenza from Nasopharyngeal swab specimens and should not be used as a sole basis for treatment. Nasal washings and aspirates are unacceptable for Xpert Xpress  SARS-CoV-2/FLU/RSV testing.  Fact Sheet for Patients: BloggerCourse.com  Fact Sheet for Healthcare Providers: SeriousBroker.it  This test is not yet approved or cleared by the [place] FDA and has been authorized for detection and/or diagnosis of SARS-CoV-2 by FDA under an Emergency Use Authorization (EUA). This EUA will remain in effect (meaning this test can be used) for the duration of the COVID-19 declaration under Section 564(b)(1) of the Act, 21 U.S.C. section 360bbb-3(b)(1), unless the authorization is terminated or revoked.  Performed at [facility], [address], [place], [place] [address]   Blood culture (routine x 2)     Status: Abnormal   Collection Time: 09/11/21  3:52 PM   Specimen: BLOOD RIGHT HAND  Result Value Ref Range Status   Specimen Description   Final    BLOOD RIGHT HAND Performed at [facility], [address], [place], [place] [address]    Special Requests   Final    BOTTLES DRAWN AEROBIC AND ANAEROBIC Blood Culture adequate volume Performed at [facility], [address], [place], [place] [address]    Culture  Setup Time   Final    GRAM POSITIVE COCCI IN CLUSTERS ANAEROBIC BOTTLE ONLY CRITICAL RESULT CALLED TO, READ BACK BY AND VERIFIED WITH: Damaris Hippo PHARMD, AT 1751 09/13/21 D. VANHOOK    Culture (A)  Final    STAPHYLOCOCCUS HOMINIS THE SIGNIFICANCE OF ISOLATING THIS ORGANISM FROM A SINGLE SET OF BLOOD CULTURES WHEN MULTIPLE SETS ARE DRAWN IS UNCERTAIN. PLEASE NOTIFY THE MICROBIOLOGY DEPARTMENT WITHIN ONE WEEK IF SPECIATION AND SENSITIVITIES ARE REQUIRED. Performed at [facility], [address], [place], [place] [address]    Report Status 09/15/2021 FINAL  Final  Culture, blood (Routine X 2) w Reflex to ID Panel     Status: None (Preliminary result)   Collection Time: 09/11/21  5:14 PM   Specimen: BLOOD  Result Value  Ref Range Status   Specimen Description   Final    BLOOD BLOOD LEFT HAND Performed at [facility], [address] [place], [place] [address]    Special Requests   Final    BOTTLES DRAWN AEROBIC ONLY Blood Culture results may not be optimal due to an inadequate volume of blood received in culture bottles Performed at [facility], [address], [place], [place] [address]    Culture   Final    NO GROWTH 4 DAYS Performed at [facility], [address], [place], [place] [address]    Report Status PENDING  Incomplete  Culture, blood (Routine X 2) w Reflex to ID Panel     Status: None (Preliminary result)   Collection Time: 09/14/21 12:40 PM   Specimen: BLOOD  Result Value Ref Range Status   Specimen Description   Final    BLOOD RIGHT ANTECUBITAL Performed at [facility], [address], [place], [place] [address]    Special Requests   Final    BOTTLES DRAWN AEROBIC ONLY Blood Culture adequate volume Performed at [facility], [address], [place], [place] [address]    Culture   Final    NO GROWTH 2 DAYS Performed at [facility], [address], [place], [place] [address]    Report Status PENDING  Incomplete  Culture, blood (Routine X 2) w Reflex to ID Panel     Status: None (Preliminary result)   Collection Time: 09/14/21 12:40 PM   Specimen: BLOOD  Result Value Ref Range Status   Specimen Description   Final    BLOOD BLOOD LEFT HAND Performed at [facility], [address], [place], [place] [address]    Special Requests   Final    BOTTLES DRAWN AEROBIC ONLY Blood Culture adequate volume Performed at [facility], [address] [place], [place] [address]    Culture   Final    NO GROWTH 2 DAYS Performed at [facility], [address], [place], [place] [address]    Report Status PENDING  Incomplete          Radiology  Studies: CT ABDOMEN PELVIS W CONTRAST  Addendum Date: 09/14/2021   ADDENDUM REPORT: 09/14/2021 16:48 ADDENDUM: These results were called by telephone at the time of interpretation on 09/14/2021 at 4:44 pm to provider Kathlen Mody MD, who verbally acknowledged these results. Electronically Signed   By: Tish Frederickson M.D.   On: 09/14/2021 16:48   Result Date: 09/14/2021 CLINICAL DATA:  Diverticulitis. F/u abscess after iv antibiotics Best imaging due to body habitus EXAM: CT ABDOMEN AND PELVIS WITH CONTRAST TECHNIQUE: Multidetector CT imaging of the abdomen and pelvis was performed using the standard protocol following bolus administration of intravenous contrast. CONTRAST:  OMNIPAQUE IOHEXOL 350 MG/ML SOLN COMPARISON:  CT abdomen pelvis 08/11/2021, CT abdomen pelvis 05/20/2021, CT abdomen pelvis 05/09/2021, CT abdomen pelvis 03/26/2020 FINDINGS: Lower chest: Interval development of a trace right pleural effusion. Associated passive atelectasis of right lower lobe. Bibasilar subsegmental atelectasis. Hepatobiliary: No focal liver abnormality. No gallstones, gallbladder wall thickening, or pericholecystic fluid. No biliary dilatation. Pancreas: No focal lesion. Normal pancreatic contour. No surrounding inflammatory changes. No main pancreatic ductal dilatation. Spleen: Normal in size without focal abnormality. Adrenals/Urinary Tract: No adrenal nodule bilaterally. Bilateral kidneys enhance symmetrically. No hydronephrosis. No hydroureter. The urinary bladder is unremarkable. No gas noted within the urinary bladder lumen or wall. Stomach/Bowel: Stomach is within normal limits. No evidence of bowel wall thickening or dilatation. Persistent marked bowel wall thickening and pericolonic  fat stranding of the mid sigmoid colon in the setting of colonic diverticulosis. Associated perforation is again noted (2:71). Appendix appears normal. Vascular/Lymphatic: No abdominal aorta or iliac aneurysm. Mild  atherosclerotic plaque of the aorta and its branches. Persistent prominent/borderline enlarged left retroperitoneal lymph nodes measuring up to 1.2 cm. No pelvic or inguinal lymphadenopathy. Reproductive: Uterus and bilateral adnexa are unremarkable. Other: Interval increase in of small volume free intraperitoneal gas. Interval increase in trace free fluid within the pelvis. Interval development of a region of free fluid and gas along the known segment of mid sigmoid diverticulitis likely representing a developing organized fluid collection/abscess. Interval increase in size of an abscess formation along the urinary bladder dome that now measures measures 1.9 cm (from 1.1 cm). Musculoskeletal: No abdominal wall hernia or abnormality. No suspicious lytic or blastic osseous lesions. No acute displaced fracture. Degenerative changes of the spine with intervertebral disc space vacuum phenomenon, posterior disc osteophyte complex, mild endplate sclerosis, osteophyte formation at the L5-S1 level. IMPRESSION: 1. Interval worsening of disease in a patient with complicated sigmoid diverticulitis. 2. Interval increase in small volume pneumoperitoneum consistent with perforation. 3. Interval development of a region of free fluid and gas along the known segment of mid sigmoid diverticulitis likely representing a developing organized fluid collection/abscess. Please consider use of PO and IV contrast in following CT scans in this patient. 4. Interval increase in size of a 1.9 cm (from 1.1 cm) abscess along the urinary bladder dome. 5. Persistent prominent/borderline enlarged left retroperitoneal lymph nodes measuring up to 1.2 cm. Findings may be reactive in etiology. Recommend attention on follow-up. 6. Recommend colonoscopy status post treatment and status post complete resolution of inflammatory changes to exclude an underlying lesion. 7. Interval development of trace right pleural effusion. Electronically Signed: By: Tish Frederickson M.D. On: 09/14/2021 16:44   ECHOCARDIOGRAM COMPLETE  Result Date: 09/14/2021    ECHOCARDIOGRAM REPORT   Patient Name:   Lori Mcknight Date of Exam: 09/14/2021 Medical Rec #:  [id number]    Height:       66.0 in Accession #:    [id number]   Weight:       360.0 lb Date of Birth:  1985-12-29    BSA:          2.567 m Patient Age:    35 years     BP:           136/80 mmHg Patient Gender: F            HR:           94 bpm. Exam Location:  Inpatient Procedure: 2D Echo, Cardiac Doppler, Color Doppler and Intracardiac            Opacification Agent Indications:    Bacteremia  History:        Patient has no prior history of Echocardiogram examinations.                 Signs/Symptoms:Shortness of Breath; Risk Factors:Hypertension.  Sonographer:    Eulah Pont RDCS Referring Phys: Herminio Heads  Sonographer Comments: Patient is morbidly obese. IMPRESSIONS  1. Technically difficult echo with poor image quality.  2. Left ventricular ejection fraction, by estimation, is 65 to 70%. The left ventricle has normal function. The left ventricle has no regional wall motion abnormalities. Left ventricular diastolic parameters were normal.  3. Right ventricular systolic function is normal. The right ventricular size is normal.  4. The mitral valve was not well visualized.  Trivial mitral valve regurgitation.  5. The aortic valve was not well visualized. Aortic valve regurgitation is not visualized. FINDINGS  Left Ventricle: Left ventricular ejection fraction, by estimation, is 65 to 70%. The left ventricle has normal function. The left ventricle has no regional wall motion abnormalities. Definity contrast agent was given IV to delineate the left ventricular  endocardial borders. The left ventricular internal cavity size was small. There is no left ventricular hypertrophy. Left ventricular diastolic parameters were normal. Right Ventricle: The right ventricular size is normal. Right vetricular wall thickness was not well  visualized. Right ventricular systolic function is normal. Left Atrium: Left atrial size was normal in size. Right Atrium: Right atrial size was normal in size. Pericardium: There is no evidence of pericardial effusion. Mitral Valve: The mitral valve was not well visualized. Trivial mitral valve regurgitation. Tricuspid Valve: The tricuspid valve is not well visualized. Tricuspid valve regurgitation is not demonstrated. Aortic Valve: The aortic valve was not well visualized. Aortic valve regurgitation is not visualized. Pulmonic Valve: The pulmonic valve was not well visualized. Pulmonic valve regurgitation is not visualized. Aorta: The aortic root and ascending aorta are structurally normal, with no evidence of dilitation. IAS/Shunts: The interatrial septum was not well visualized. Additional Comments: Technically difficult echo with poor image quality.  LEFT VENTRICLE PLAX 2D LVIDd:         4.00 cm   Diastology LVIDs:         2.60 cm   LV e' medial:    7.51 cm/s LV PW:         1.10 cm   LV E/e' medial:  9.8 LV IVS:        1.10 cm   LV e' lateral:   9.79 cm/s LVOT diam:     2.10 cm   LV E/e' lateral: 7.5 LV SV:         47 LV SV Index:   18 LVOT Area:     3.46 cm  RIGHT VENTRICLE RV S prime:     13.50 cm/s TAPSE (M-mode): 1.8 cm LEFT ATRIUM           Index        RIGHT ATRIUM           Index LA diam:      3.60 cm 1.40 cm/m   RA Area:     20.70 cm LA Vol (A4C): 58.5 ml 22.79 ml/m  RA Volume:   63.20 ml  24.62 ml/m  AORTIC VALVE LVOT Vmax:   92.10 cm/s LVOT Vmean:  58.700 cm/s LVOT VTI:    0.137 m  AORTA Ao Root diam: 3.20 cm Ao Asc diam:  3.30 cm MITRAL VALVE MV Area (PHT): 5.75 cm    SHUNTS MV Decel Time: 132 msec    Systemic VTI:  0.14 m MV E velocity: 73.70 cm/s  Systemic Diam: 2.10 cm MV A velocity: 75.00 cm/s MV E/A ratio:  0.98 Kristeen Miss MD Electronically signed by Kristeen Miss MD Signature Date/Time: 09/14/2021/3:23:52 PM    Final         Scheduled Meds:  [START ON 09/17/2021] acetaminophen   1,000 mg Oral On Call to OR   [START ON 09/17/2021] alvimopan  12 mg Oral On Call to OR   [START ON 09/17/2021] bupivacaine liposome  20 mL Infiltration Once   [START ON 09/17/2021] celecoxib  200 mg Oral On Call to OR   cyclobenzaprine  10 mg Oral QHS   [START ON 09/17/2021] enoxaparin (LOVENOX)  injection  40 mg Subcutaneous On Call   enoxaparin (LOVENOX) injection  80 mg Subcutaneous Q12H   [START ON 09/17/2021] feeding supplement  296 mL Oral Once   feeding supplement  592 mL Oral Once   [START ON 09/17/2021] gabapentin  300 mg Oral On Call to OR   levothyroxine  88 mcg Oral QAC breakfast   medroxyPROGESTERone  10 mg Oral Daily   metroNIDAZOLE  1,000 mg Oral 3 times per day   pantoprazole  40 mg Oral Daily   potassium chloride  40 mEq Oral BID   Continuous Infusions:  sodium chloride Stopped (09/16/21 0036)   [START ON 09/17/2021] cefoTEtan (CEFOTAN) IV     methocarbamol (ROBAXIN) IV 500 mg (09/16/21 0506)   piperacillin-tazobactam (ZOSYN)  IV 3.375 g (09/16/21 0802)     LOS: 5 days        Kathlen Mody, MD [facility]   To contact the attending provider between 7A-7P or the covering provider during after hours 7P-7A, please log into the web site [email or web address] and access using universal [place] password for that web site. If you do not have the password, please call the hospital operator.  09/16/2021, 12:39 PM

## 2021-09-16 NOTE — Progress Notes (Signed)
RN paged Dr Michaell Cowing via [facility] office x2 to clarify Lovenox orders for next 24 hours per pharmacy request.  Await return call.

## 2021-09-17 ENCOUNTER — Inpatient Hospital Stay (HOSPITAL_COMMUNITY): Payer: Self-pay | Admitting: Anesthesiology

## 2021-09-17 ENCOUNTER — Encounter (HOSPITAL_COMMUNITY)
Admission: EM | Disposition: A | Discharge status: Home Health | Payer: Self-pay | Source: Home / Self Care | Attending: Internal Medicine

## 2021-09-17 ENCOUNTER — Encounter (HOSPITAL_COMMUNITY): Payer: Self-pay | Admitting: Internal Medicine

## 2021-09-17 DIAGNOSIS — K5720 Diverticulitis of large intestine with perforation and abscess without bleeding: Secondary | ICD-10-CM | POA: Diagnosis present

## 2021-09-17 LAB — CBC WITH DIFFERENTIAL/PLATELET
Abs Immature Granulocytes: 0.15 10*3/uL — ABNORMAL HIGH (ref 0.00–0.07)
Basophils Absolute: 0.1 10*3/uL (ref 0.0–0.1)
Basophils Relative: 1 %
Eosinophils Absolute: 0.2 10*3/uL (ref 0.0–0.5)
Eosinophils Relative: 3 %
HCT: 41.4 % (ref 36.0–46.0)
Hemoglobin: 13.6 g/dL (ref 12.0–15.0)
Immature Granulocytes: 2 %
Lymphocytes Relative: 26 %
Lymphs Abs: 2.2 10*3/uL (ref 0.7–4.0)
MCH: 31.5 pg (ref 26.0–34.0)
MCHC: 32.9 g/dL (ref 30.0–36.0)
MCV: 95.8 fL (ref 80.0–100.0)
Monocytes Absolute: 0.8 10*3/uL (ref 0.1–1.0)
Monocytes Relative: 10 %
Neutro Abs: 5.1 10*3/uL (ref 1.7–7.7)
Neutrophils Relative %: 58 %
Platelets: 193 10*3/uL (ref 150–400)
RBC: 4.32 MIL/uL (ref 3.87–5.11)
RDW: 12.2 % (ref 11.5–15.5)
WBC: 8.6 10*3/uL (ref 4.0–10.5)
nRBC: 0 % (ref 0.0–0.2)

## 2021-09-17 LAB — BASIC METABOLIC PANEL
Anion gap: 7 (ref 5–15)
BUN: <5 mg/dL — ABNORMAL LOW (ref 6–20)
CO2: 24 mmol/L (ref 22–32)
Calcium: 8.9 mg/dL (ref 8.9–10.3)
Chloride: 105 mmol/L (ref 98–111)
Creatinine, Ser: 0.6 mg/dL (ref 0.44–1.00)
GFR, Estimated: >60 mL/min (ref >60)
Glucose, Bld: 91 mg/dL (ref 70–99)
Potassium: 3.6 mmol/L (ref 3.5–5.1)
Sodium: 136 mmol/L (ref 135–145)

## 2021-09-17 LAB — CULTURE, BLOOD (ROUTINE X 2): Culture: NO GROWTH

## 2021-09-17 SURGERY — COLECTOMY, SIGMOID, ROBOT-ASSISTED
Anesthesia: General | Site: Bladder

## 2021-09-17 MED ORDER — SUGAMMADEX SODIUM 500 MG/5ML IV SOLN
INTRAVENOUS | Status: DC | PRN
  Administered 2021-09-17: 400 mg via INTRAVENOUS

## 2021-09-17 MED ORDER — 0.9 % SODIUM CHLORIDE (POUR BTL) OPTIME
TOPICAL | Status: DC | PRN
  Administered 2021-09-17: 2000 mL

## 2021-09-17 MED ORDER — CHLORHEXIDINE GLUCONATE 0.12 % MT SOLN
15.0000 mL | Freq: Once | OROMUCOSAL | Status: AC
  Administered 2021-09-17: 15 mL via OROMUCOSAL

## 2021-09-17 MED ORDER — BUPIVACAINE LIPOSOME 1.3 % IJ SUSP
INTRAMUSCULAR | Status: AC
  Filled 2021-09-17: qty 20

## 2021-09-17 MED ORDER — PROPOFOL 10 MG/ML IV BOLUS
INTRAVENOUS | Status: AC
  Filled 2021-09-17: qty 20

## 2021-09-17 MED ORDER — FENTANYL CITRATE PF 50 MCG/ML IJ SOSY
PREFILLED_SYRINGE | INTRAMUSCULAR | Status: AC
  Filled 2021-09-17: qty 1

## 2021-09-17 MED ORDER — APREPITANT 40 MG PO CAPS: 40.0000 mg | ORAL_CAPSULE | Freq: Once | ORAL | Status: DC

## 2021-09-17 MED ORDER — SODIUM CHLORIDE 0.9 % IV SOLN: Freq: Three times a day (TID) | INTRAVENOUS | Status: DC | PRN

## 2021-09-17 MED ORDER — LABETALOL HCL 5 MG/ML IV SOLN
INTRAVENOUS | Status: DC | PRN
  Administered 2021-09-17 (×3): 5 mg via INTRAVENOUS
  Administered 2021-09-17 (×2): 2.5 mg via INTRAVENOUS

## 2021-09-17 MED ORDER — SODIUM CHLORIDE 0.9 % IV SOLN
INTRAVENOUS | Status: DC | PRN
  Administered 2021-09-17: 1000 mL

## 2021-09-17 MED ORDER — BUPIVACAINE LIPOSOME 1.3 % IJ SUSP
INTRAMUSCULAR | Status: DC | PRN
  Administered 2021-09-17: 20 mL

## 2021-09-17 MED ORDER — GABAPENTIN 100 MG PO CAPS
200.0000 mg | ORAL_CAPSULE | Freq: Three times a day (TID) | ORAL | Status: DC
  Administered 2021-09-17: 200 mg via ORAL
  Filled 2021-09-17 (×2): qty 2

## 2021-09-17 MED ORDER — ALVIMOPAN 12 MG PO CAPS
12.0000 mg | ORAL_CAPSULE | Freq: Two times a day (BID) | ORAL | Status: DC
  Administered 2021-09-18 – 2021-09-23 (×11): 12 mg via ORAL
  Filled 2021-09-17 (×11): qty 1

## 2021-09-17 MED ORDER — ENSURE SURGERY PO LIQD
237.0000 mL | Freq: Two times a day (BID) | ORAL | Status: DC
  Administered 2021-09-19 – 2021-09-24 (×6): 237 mL via ORAL
  Filled 2021-09-17 (×14): qty 237

## 2021-09-17 MED ORDER — MIDAZOLAM HCL 2 MG/2ML IJ SOLN
INTRAMUSCULAR | Status: DC | PRN
Start: 2021-09-17 — End: 2021-09-17
  Administered 2021-09-17: 2 mg via INTRAVENOUS

## 2021-09-17 MED ORDER — SUCCINYLCHOLINE CHLORIDE 200 MG/10ML IV SOSY
PREFILLED_SYRINGE | INTRAVENOUS | Status: DC | PRN
  Administered 2021-09-17: 160 mg via INTRAVENOUS

## 2021-09-17 MED ORDER — ROCURONIUM BROMIDE 10 MG/ML (PF) SYRINGE
PREFILLED_SYRINGE | INTRAVENOUS | Status: DC | PRN
  Administered 2021-09-17: 20 mg via INTRAVENOUS
  Administered 2021-09-17: 40 mg via INTRAVENOUS
  Administered 2021-09-17: 20 mg via INTRAVENOUS
  Administered 2021-09-17: 60 mg via INTRAVENOUS

## 2021-09-17 MED ORDER — PROCHLORPERAZINE MALEATE 10 MG PO TABS: 10.0000 mg | ORAL_TABLET | Freq: Four times a day (QID) | ORAL | Status: DC | PRN

## 2021-09-17 MED ORDER — HYDRALAZINE HCL 20 MG/ML IJ SOLN
INTRAMUSCULAR | Status: AC
  Filled 2021-09-17: qty 1

## 2021-09-17 MED ORDER — FENTANYL CITRATE (PF) 250 MCG/5ML IJ SOLN
INTRAMUSCULAR | Status: AC
  Filled 2021-09-17: qty 5

## 2021-09-17 MED ORDER — ACETAMINOPHEN 500 MG PO TABS: 1000.0000 mg | ORAL_TABLET | Freq: Once | ORAL | Status: DC

## 2021-09-17 MED ORDER — SCOPOLAMINE 1 MG/3DAYS TD PT72
MEDICATED_PATCH | TRANSDERMAL | Status: DC | PRN
  Administered 2021-09-17: 1.5 mg via TRANSDERMAL

## 2021-09-17 MED ORDER — PIPERACILLIN-TAZOBACTAM 3.375 G IVPB
3.3750 g | Freq: Three times a day (TID) | INTRAVENOUS | Status: AC
  Administered 2021-09-17 – 2021-09-22 (×15): 3.375 g via INTRAVENOUS
  Filled 2021-09-17 (×15): qty 50

## 2021-09-17 MED ORDER — OXYCODONE HCL 5 MG PO TABS: 5.0000 mg | ORAL_TABLET | Freq: Once | ORAL | Status: DC | PRN

## 2021-09-17 MED ORDER — ROCURONIUM BROMIDE 10 MG/ML (PF) SYRINGE
PREFILLED_SYRINGE | INTRAVENOUS | Status: AC
  Filled 2021-09-17: qty 10

## 2021-09-17 MED ORDER — LACTATED RINGERS IV SOLN: INTRAVENOUS | Status: AC

## 2021-09-17 MED ORDER — ACETAMINOPHEN 500 MG PO TABS
1000.0000 mg | ORAL_TABLET | Freq: Four times a day (QID) | ORAL | Status: DC
  Administered 2021-09-18 – 2021-09-24 (×23): 1000 mg via ORAL
  Filled 2021-09-17 (×24): qty 2

## 2021-09-17 MED ORDER — OXYCODONE HCL 5 MG/5ML PO SOLN: 5.0000 mg | Freq: Once | ORAL | Status: DC | PRN

## 2021-09-17 MED ORDER — BUPIVACAINE-EPINEPHRINE (PF) 0.25% -1:200000 IJ SOLN
INTRAMUSCULAR | Status: DC | PRN
  Administered 2021-09-17: 60 mL

## 2021-09-17 MED ORDER — CHLORHEXIDINE GLUCONATE CLOTH 2 % EX PADS
6.0000 | MEDICATED_PAD | Freq: Every day | CUTANEOUS | Status: DC
  Administered 2021-09-18: 6 via TOPICAL

## 2021-09-17 MED ORDER — STERILE WATER FOR INJECTION IJ SOLN
INTRAMUSCULAR | Status: AC
  Filled 2021-09-17: qty 10

## 2021-09-17 MED ORDER — HYDRALAZINE HCL 20 MG/ML IJ SOLN
10.0000 mg | Freq: Once | INTRAMUSCULAR | Status: AC
  Administered 2021-09-17: 10 mg via INTRAVENOUS

## 2021-09-17 MED ORDER — SIMETHICONE 80 MG PO CHEW
40.0000 mg | CHEWABLE_TABLET | Freq: Four times a day (QID) | ORAL | Status: DC | PRN
  Administered 2021-09-21 – 2021-09-23 (×3): 40 mg via ORAL
  Filled 2021-09-17 (×3): qty 1

## 2021-09-17 MED ORDER — LABETALOL HCL 5 MG/ML IV SOLN
INTRAVENOUS | Status: AC
  Filled 2021-09-17: qty 4

## 2021-09-17 MED ORDER — DIPHENHYDRAMINE HCL 50 MG/ML IJ SOLN: 12.5000 mg | Freq: Four times a day (QID) | INTRAMUSCULAR | Status: DC | PRN

## 2021-09-17 MED ORDER — FENTANYL CITRATE (PF) 100 MCG/2ML IJ SOLN
INTRAMUSCULAR | Status: AC
  Filled 2021-09-17: qty 2

## 2021-09-17 MED ORDER — SCOPOLAMINE 1 MG/3DAYS TD PT72
MEDICATED_PATCH | TRANSDERMAL | Status: AC
  Filled 2021-09-17: qty 1

## 2021-09-17 MED ORDER — FENTANYL CITRATE PF 50 MCG/ML IJ SOSY
25.0000 ug | PREFILLED_SYRINGE | INTRAMUSCULAR | Status: DC | PRN
  Administered 2021-09-17: 50 ug via INTRAVENOUS
  Administered 2021-09-17: 25 ug via INTRAVENOUS

## 2021-09-17 MED ORDER — SODIUM CHLORIDE 0.9 % IV SOLN
INTRAVENOUS | Status: DC
  Filled 2021-09-17: qty 6

## 2021-09-17 MED ORDER — LACTATED RINGERS IV SOLN: INTRAVENOUS | Status: DC | PRN

## 2021-09-17 MED ORDER — METHYLENE BLUE 0.5 % INJ SOLN
INTRAVENOUS | Status: AC
  Filled 2021-09-17: qty 10

## 2021-09-17 MED ORDER — LIDOCAINE 2% (20 MG/ML) 5 ML SYRINGE
INTRAMUSCULAR | Status: DC | PRN
  Administered 2021-09-17: 80 mg via INTRAVENOUS

## 2021-09-17 MED ORDER — FENTANYL CITRATE (PF) 250 MCG/5ML IJ SOLN
INTRAMUSCULAR | Status: DC | PRN
  Administered 2021-09-17: 100 ug via INTRAVENOUS
  Administered 2021-09-17 (×2): 25 ug via INTRAVENOUS
  Administered 2021-09-17: 100 ug via INTRAVENOUS
  Administered 2021-09-17: 50 ug via INTRAVENOUS
  Administered 2021-09-17: 25 ug via INTRAVENOUS
  Administered 2021-09-17: 100 ug via INTRAVENOUS
  Administered 2021-09-17: 25 ug via INTRAVENOUS
  Administered 2021-09-17: 100 ug via INTRAVENOUS
  Administered 2021-09-17: 50 ug via INTRAVENOUS

## 2021-09-17 MED ORDER — KETAMINE HCL 10 MG/ML IJ SOLN
INTRAMUSCULAR | Status: DC | PRN
  Administered 2021-09-17: 10 mg via INTRAVENOUS
  Administered 2021-09-17: 30 mg via INTRAVENOUS
  Administered 2021-09-17: 20 mg via INTRAVENOUS

## 2021-09-17 MED ORDER — LIDOCAINE HCL (PF) 2 % IJ SOLN
INTRAMUSCULAR | Status: AC
  Filled 2021-09-17: qty 5

## 2021-09-17 MED ORDER — SODIUM CHLORIDE 0.9 % IV SOLN
100.0000 mg | INTRAVENOUS | Status: AC
  Administered 2021-09-18 – 2021-09-21 (×4): 100 mg via INTRAVENOUS
  Filled 2021-09-17 (×6): qty 100

## 2021-09-17 MED ORDER — MIDAZOLAM HCL 2 MG/2ML IJ SOLN
INTRAMUSCULAR | Status: AC
  Filled 2021-09-17: qty 2

## 2021-09-17 MED ORDER — PROPOFOL 10 MG/ML IV BOLUS
INTRAVENOUS | Status: DC | PRN
  Administered 2021-09-17: 50 mg via INTRAVENOUS
  Administered 2021-09-17: 300 mg via INTRAVENOUS
  Administered 2021-09-17: 50 mg via INTRAVENOUS

## 2021-09-17 MED ORDER — DIPHENHYDRAMINE HCL 12.5 MG/5ML PO ELIX: 12.5000 mg | ORAL_SOLUTION | Freq: Four times a day (QID) | ORAL | Status: DC | PRN

## 2021-09-17 MED ORDER — STERILE WATER FOR INJECTION IJ SOLN
INTRAMUSCULAR | Status: DC | PRN
  Administered 2021-09-17: 15 mL via INTRAMUSCULAR

## 2021-09-17 MED ORDER — MORPHINE SULFATE (PF) 2 MG/ML IV SOLN
2.0000 mg | INTRAVENOUS | Status: DC | PRN
Start: 2021-09-17 — End: 2021-09-18
  Administered 2021-09-17: 4 mg via INTRAVENOUS
  Administered 2021-09-18 (×2): 6 mg via INTRAVENOUS
  Filled 2021-09-17: qty 2
  Filled 2021-09-17 (×2): qty 3
  Filled 2021-09-17: qty 2

## 2021-09-17 MED ORDER — METOPROLOL TARTRATE 5 MG/5ML IV SOLN
5.0000 mg | Freq: Four times a day (QID) | INTRAVENOUS | Status: DC | PRN
  Administered 2021-09-18: 5 mg via INTRAVENOUS
  Filled 2021-09-17 (×2): qty 5

## 2021-09-17 MED ORDER — LACTATED RINGERS IR SOLN
Status: DC | PRN
  Administered 2021-09-17: 1000 mL

## 2021-09-17 MED ORDER — ACETAMINOPHEN 325 MG PO TABS
325.0000 mg | ORAL_TABLET | Freq: Once | ORAL | Status: AC
Start: 2021-09-17 — End: 2021-09-17
  Administered 2021-09-17: 325 mg via ORAL
  Filled 2021-09-17: qty 1

## 2021-09-17 MED ORDER — LABETALOL HCL 5 MG/ML IV SOLN
10.0000 mg | INTRAVENOUS | Status: AC | PRN
  Administered 2021-09-17 (×2): 10 mg via INTRAVENOUS

## 2021-09-17 MED ORDER — MELATONIN 3 MG PO TABS
3.0000 mg | ORAL_TABLET | Freq: Every evening | ORAL | Status: DC | PRN
  Administered 2021-09-22 (×2): 3 mg via ORAL
  Filled 2021-09-17 (×2): qty 1

## 2021-09-17 MED ORDER — PROCHLORPERAZINE EDISYLATE 10 MG/2ML IJ SOLN: 5.0000 mg | Freq: Four times a day (QID) | INTRAMUSCULAR | Status: DC | PRN

## 2021-09-17 MED ORDER — BUPIVACAINE-EPINEPHRINE (PF) 0.25% -1:200000 IJ SOLN
INTRAMUSCULAR | Status: AC
  Filled 2021-09-17: qty 60

## 2021-09-17 MED ORDER — ENOXAPARIN SODIUM 80 MG/0.8ML IJ SOSY: 80.0000 mg | PREFILLED_SYRINGE | Freq: Two times a day (BID) | INTRAMUSCULAR | Status: DC

## 2021-09-17 MED ORDER — DEXAMETHASONE SODIUM PHOSPHATE 10 MG/ML IJ SOLN
INTRAMUSCULAR | Status: DC | PRN
  Administered 2021-09-17: 5 mg via INTRAVENOUS

## 2021-09-17 MED ORDER — PROMETHAZINE HCL 25 MG/ML IJ SOLN: 6.2500 mg | INTRAMUSCULAR | Status: DC | PRN

## 2021-09-17 MED ORDER — TRAMADOL HCL 50 MG PO TABS
50.0000 mg | ORAL_TABLET | Freq: Four times a day (QID) | ORAL | Status: DC | PRN
  Administered 2021-09-18 – 2021-09-20 (×6): 100 mg via ORAL
  Filled 2021-09-17 (×7): qty 2

## 2021-09-17 MED ORDER — LACTATED RINGERS IV SOLN: INTRAVENOUS | Status: DC

## 2021-09-17 MED ORDER — ENOXAPARIN SODIUM 40 MG/0.4ML IJ SOSY
40.0000 mg | PREFILLED_SYRINGE | INTRAMUSCULAR | Status: DC
  Filled 2021-09-17: qty 0.4

## 2021-09-17 MED ORDER — KETAMINE HCL-SODIUM CHLORIDE 100-0.9 MG/10ML-% IV SOSY
PREFILLED_SYRINGE | INTRAVENOUS | Status: AC
  Filled 2021-09-17: qty 10

## 2021-09-17 SURGICAL SUPPLY — 124 items
APL PRP STRL LF DISP 70% ISPRP (MISCELLANEOUS) ×2
APPLIER CLIP 5 13 M/L LIGAMAX5 (MISCELLANEOUS)
APPLIER CLIP ROT 10 11.4 M/L (STAPLE)
APR CLP MED LRG 11.4X10 (STAPLE)
APR CLP MED LRG 5 ANG JAW (MISCELLANEOUS)
BAG COUNTER SPONGE SURGICOUNT (BAG) ×3 IMPLANT
BAG SPNG CNTER NS LX DISP (BAG) ×2
BLADE EXTENDED COATED 6.5IN (ELECTRODE) IMPLANT
CANNULA REDUC XI 12-8 STAPL (CANNULA)
CANNULA REDUCER 12-8 DVNC XI (CANNULA) IMPLANT
CATH URETL OPEN END 6FR 70 (CATHETERS) ×1 IMPLANT
CELLS DAT CNTRL 66122 CELL SVR (MISCELLANEOUS) IMPLANT
CHLORAPREP W/TINT 26 (MISCELLANEOUS) ×1 IMPLANT
CLIP APPLIE 5 13 M/L LIGAMAX5 (MISCELLANEOUS) IMPLANT
CLIP APPLIE ROT 10 11.4 M/L (STAPLE) IMPLANT
COVER SURGICAL LIGHT HANDLE (MISCELLANEOUS) ×6 IMPLANT
COVER TIP SHEARS 8 DVNC (MISCELLANEOUS) ×2 IMPLANT
COVER TIP SHEARS 8MM DA VINCI (MISCELLANEOUS) ×3
DECANTER SPIKE VIAL GLASS SM (MISCELLANEOUS) ×3 IMPLANT
DEVICE TROCAR PUNCTURE CLOSURE (ENDOMECHANICALS) IMPLANT
DRAIN CHANNEL 19F RND (DRAIN) ×1 IMPLANT
DRAPE ARM DVNC X/XI (DISPOSABLE) ×8 IMPLANT
DRAPE COLUMN DVNC XI (DISPOSABLE) ×2 IMPLANT
DRAPE DA VINCI XI ARM (DISPOSABLE) ×12
DRAPE DA VINCI XI COLUMN (DISPOSABLE) ×3
DRAPE SURG IRRIG POUCH 19X23 (DRAPES) ×3 IMPLANT
DRSG OPSITE POSTOP 4X10 (GAUZE/BANDAGES/DRESSINGS) IMPLANT
DRSG OPSITE POSTOP 4X6 (GAUZE/BANDAGES/DRESSINGS) ×1 IMPLANT
DRSG OPSITE POSTOP 4X8 (GAUZE/BANDAGES/DRESSINGS) IMPLANT
DRSG TEGADERM 2-3/8X2-3/4 SM (GAUZE/BANDAGES/DRESSINGS) ×11 IMPLANT
DRSG TEGADERM 4X4.75 (GAUZE/BANDAGES/DRESSINGS) ×1 IMPLANT
ELECT PENCIL ROCKER SW 15FT (MISCELLANEOUS) ×3 IMPLANT
ELECT REM PT RETURN 15FT ADLT (MISCELLANEOUS) ×3 IMPLANT
ENDOLOOP SUT PDS II  0 18 (SUTURE)
ENDOLOOP SUT PDS II 0 18 (SUTURE) IMPLANT
EVACUATOR SILICONE 100CC (DRAIN) ×1 IMPLANT
GAUZE SPONGE 2X2 8PLY STRL LF (GAUZE/BANDAGES/DRESSINGS) ×2 IMPLANT
GLOVE SURG NEOPR MICRO LF SZ8 (GLOVE) ×9 IMPLANT
GLOVE SURG UNDER LTX SZ8 (GLOVE) ×9 IMPLANT
GOWN STRL REUS W/TWL XL LVL3 (GOWN DISPOSABLE) ×9 IMPLANT
GRASPER SUT TROCAR 14GX15 (MISCELLANEOUS) IMPLANT
GUIDEWIRE ANG ZIPWIRE 038X150 (WIRE) ×1 IMPLANT
HOLDER FOLEY CATH W/STRAP (MISCELLANEOUS) ×3 IMPLANT
IRRIG SUCT STRYKERFLOW 2 WTIP (MISCELLANEOUS) ×3
IRRIGATION SUCT STRKRFLW 2 WTP (MISCELLANEOUS) ×2 IMPLANT
KIT PROCEDURE DA VINCI SI (MISCELLANEOUS) ×3
KIT PROCEDURE DVNC SI (MISCELLANEOUS) IMPLANT
KIT SIGMOIDOSCOPE (SET/KITS/TRAYS/PACK) ×1 IMPLANT
KIT TURNOVER KIT A (KITS) ×1 IMPLANT
NDL INSUFFLATION 14GA 120MM (NEEDLE) ×2 IMPLANT
NDL INSUFFLATION 14GA 150MM (NEEDLE) IMPLANT
NDL SPNL 22GX3.5 QUINCKE BK (NEEDLE) IMPLANT
NEEDLE INSUFFLATION 14GA 120MM (NEEDLE) ×3 IMPLANT
NEEDLE INSUFFLATION 14GA 150MM (NEEDLE) ×3 IMPLANT
NEEDLE SPNL 22GX3.5 QUINCKE BK (NEEDLE) ×3 IMPLANT
PACK CARDIOVASCULAR III (CUSTOM PROCEDURE TRAY) ×3 IMPLANT
PACK COLON (CUSTOM PROCEDURE TRAY) ×3 IMPLANT
PACK CYSTO (CUSTOM PROCEDURE TRAY) ×1 IMPLANT
PAD POSITIONING PINK XL (MISCELLANEOUS) ×3 IMPLANT
PROTECTOR NERVE ULNAR (MISCELLANEOUS) ×4 IMPLANT
RELOAD STAPLE 45 3.5 BLU DVNC (STAPLE) IMPLANT
RELOAD STAPLE 45 4.3 GRN DVNC (STAPLE) IMPLANT
RELOAD STAPLE 60 3.5 BLU DVNC (STAPLE) IMPLANT
RELOAD STAPLE 60 4.3 GRN DVNC (STAPLE) IMPLANT
RELOAD STAPLER 3.5X45 BLU DVNC (STAPLE) IMPLANT
RELOAD STAPLER 3.5X60 BLU DVNC (STAPLE) IMPLANT
RELOAD STAPLER 4.3X45 GRN DVNC (STAPLE) IMPLANT
RELOAD STAPLER 4.3X60 GRN DVNC (STAPLE) IMPLANT
RETRACTOR WND ALEXIS 18 MED (MISCELLANEOUS) IMPLANT
RTRCTR WOUND ALEXIS 18CM MED (MISCELLANEOUS)
SCISSORS LAP 5X35 DISP (ENDOMECHANICALS) ×3 IMPLANT
SEAL CANN UNIV 5-8 DVNC XI (MISCELLANEOUS) ×6 IMPLANT
SEAL XI 5MM-8MM UNIVERSAL (MISCELLANEOUS) ×12
SEALER VESSEL DA VINCI XI (MISCELLANEOUS) ×3
SEALER VESSEL EXT DVNC XI (MISCELLANEOUS) ×2 IMPLANT
SOLUTION ELECTROLUBE (MISCELLANEOUS) ×3 IMPLANT
SPONGE GAUZE 2X2 8PLY STRL LF (GAUZE/BANDAGES/DRESSINGS) ×1 IMPLANT
SPONGE GAUZE 2X2 STER 10/PKG (GAUZE/BANDAGES/DRESSINGS) ×1
STAPLER 45 DA VINCI SURE FORM (STAPLE)
STAPLER 45 SUREFORM DVNC (STAPLE) IMPLANT
STAPLER 60 DA VINCI SURE FORM (STAPLE)
STAPLER 60 SUREFORM DVNC (STAPLE) IMPLANT
STAPLER CANNULA SEAL DVNC XI (STAPLE) ×2 IMPLANT
STAPLER CANNULA SEAL XI (STAPLE) ×3
STAPLER ECHELON POWER CIR 29 (STAPLE) IMPLANT
STAPLER ECHELON POWER CIR 31 (STAPLE) IMPLANT
STAPLER RELOAD 3.5X45 BLU DVNC (STAPLE)
STAPLER RELOAD 3.5X45 BLUE (STAPLE)
STAPLER RELOAD 3.5X60 BLU DVNC (STAPLE)
STAPLER RELOAD 3.5X60 BLUE (STAPLE)
STAPLER RELOAD 4.3X45 GREEN (STAPLE)
STAPLER RELOAD 4.3X45 GRN DVNC (STAPLE)
STAPLER RELOAD 4.3X60 GREEN (STAPLE)
STAPLER RELOAD 4.3X60 GRN DVNC (STAPLE)
STOPCOCK 4 WAY LG BORE MALE ST (IV SETS) ×6 IMPLANT
SURGILUBE 2OZ TUBE FLIPTOP (MISCELLANEOUS) ×1 IMPLANT
SUT MNCRL AB 4-0 PS2 18 (SUTURE) ×3 IMPLANT
SUT PDS AB 1 CT1 27 (SUTURE) ×6 IMPLANT
SUT PROLENE 0 CT 2 (SUTURE) IMPLANT
SUT PROLENE 2 0 KS (SUTURE) ×1 IMPLANT
SUT PROLENE 2 0 SH DA (SUTURE) ×1 IMPLANT
SUT SILK 2 0 (SUTURE) ×3
SUT SILK 2 0 SH CR/8 (SUTURE) IMPLANT
SUT SILK 2-0 18XBRD TIE 12 (SUTURE) IMPLANT
SUT SILK 3 0 (SUTURE)
SUT SILK 3 0 SH CR/8 (SUTURE) ×3 IMPLANT
SUT SILK 3-0 18XBRD TIE 12 (SUTURE) IMPLANT
SUT V-LOC BARB 180 2/0GR6 GS22 (SUTURE)
SUT VIC AB 3-0 SH 18 (SUTURE) IMPLANT
SUT VIC AB 3-0 SH 27 (SUTURE)
SUT VIC AB 3-0 SH 27XBRD (SUTURE) IMPLANT
SUT VICRYL 0 UR6 27IN ABS (SUTURE) ×3 IMPLANT
SUTURE V-LC BRB 180 2/0GR6GS22 (SUTURE) IMPLANT
SYR 10ML ECCENTRIC (SYRINGE) ×3 IMPLANT
SYS LAPSCP GELPORT 120MM (MISCELLANEOUS)
SYS WOUND ALEXIS 18CM MED (MISCELLANEOUS) ×3
SYSTEM LAPSCP GELPORT 120MM (MISCELLANEOUS) IMPLANT
SYSTEM WOUND ALEXIS 18CM MED (MISCELLANEOUS) ×2 IMPLANT
TOWEL OR NON WOVEN STRL DISP B (DISPOSABLE) ×3 IMPLANT
TRAY FOLEY MTR SLVR 16FR STAT (SET/KITS/TRAYS/PACK) ×3 IMPLANT
TROCAR ADV FIXATION 5X100MM (TROCAR) ×3 IMPLANT
TROCAR BLADELESS OPT 5 150 (ENDOMECHANICALS) ×1 IMPLANT
TUBING CONNECTING 10 (TUBING) ×6 IMPLANT
TUBING INSUFFLATION 10FT LAP (TUBING) ×3 IMPLANT

## 2021-09-17 NOTE — Anesthesia Preprocedure Evaluation (Addendum)
Anesthesia Evaluation  Patient identified by MRN, date of birth, ID band Patient awake    Reviewed: Allergy & Precautions, NPO status , Patient's Chart, lab work & pertinent test results  History of Anesthesia Complications (+) PONV and history of anesthetic complications  Airway Mallampati: II  TM Distance: >3 FB Neck ROM: Full    Dental  (+) Dental Advisory Given   Pulmonary asthma , Current Smoker and Patient abstained from smoking.,   Possible OSA    Pulmonary exam normal        Cardiovascular hypertension (off meds), Normal cardiovascular exam     Neuro/Psych  Headaches, PSYCHIATRIC DISORDERS Anxiety Depression    GI/Hepatic negative GI ROS, Neg liver ROS,   Endo/Other  Hypothyroidism Morbid obesity  Renal/GU negative Renal ROS     Musculoskeletal negative musculoskeletal ROS (+)   Abdominal (+) + obese,   Peds  Hematology negative hematology ROS (+)   Anesthesia Other Findings HSV  Reproductive/Obstetrics  PCOS Endometriosis                             Anesthesia Physical Anesthesia Plan  ASA: 3  Anesthesia Plan: General   Post-op Pain Management: Tylenol PO (pre-op) and Ketamine IV   Induction: Intravenous  PONV Risk Score and Plan: 4 or greater and Treatment may vary due to age or medical condition, Ondansetron, Scopolamine patch - Pre-op, Midazolam and Dexamethasone  Airway Management Planned: Oral ETT and Video Laryngoscope Planned  Additional Equipment: None  Intra-op Plan:   Post-operative Plan: Extubation in OR  Informed Consent: I have reviewed the patients History and Physical, chart, labs and discussed the procedure including the risks, benefits and alternatives for the proposed anesthesia with the patient or authorized representative who has indicated his/her understanding and acceptance.     Dental advisory given  Plan Discussed with: CRNA and  Anesthesiologist  Anesthesia Plan Comments:       Anesthesia Quick Evaluation

## 2021-09-17 NOTE — Progress Notes (Signed)
PROGRESS NOTE    Lori Mcknight  IWP:[id number] DOB: 1986-03-27 DOA: 09/11/2021 PCP: Claiborne Rigg, NP    Chief Complaint  Patient presents with   Abdominal Pain    Brief Narrative:   The patient is a 35 yr old woman who has had multiple recurrences of diverticulitis with abscess formation, depression anxiety, anemia, hypertension, hypothyroidism, PCOS, super morbid obesity presents this time with severe abdominal pain.  CT of the abdomen and pelvis demonstrated increase in the size of the abscess.  She was started on IV Zosyn and general surgery consulted. General surgery recommended IV antibiotics and clear sips. In view of her worsening abscesses and pneumoperitoneum, she is scheduled for robotic assisted sigmoid colectomy with cystoscopy with firefly injection today.     Assessment & Plan:   Principal Problem:   Diverticulitis of large intestine with abscess Active Problems:   PCOS (polycystic ovarian syndrome)   Essential hypertension   Hypothyroid   Anemia   Obesity, morbid, BMI 50 or higher (HCC)   Family history of colon cancer in father   Asthma, mild intermittent   Acute diverticulitis of the large intestine with abscess formation This is a chronic and recurrent issue for the patient Dr Gerri Lins has discussed with Dr. Cliffton Asters ,suggested to the abscess has increased in size  but it remains too small for percutaneous drainage.  Furthermore given the patient's body habitus there is high risk for complications and having the drain being able to reach the skin.  Does not recommend an open procedure at this time due to acute infection and complications.  General surgery initially recommended  treatment with IV antibiotics and watchful observation.  As the abscess appears to be sitting between the sigmoid colon and the bladder there is likelihood of fistula formation allowing for natural drainage of the abscess.     Unfortunately , pt  continued to worsen with worsening  pneumoperitoneum and interval development of free fluid and gas along the known segment of mid sigmoid diverticulitis.  Dr Michaell Cowing scheduled her for  robotic assisted sigmoid colectomy with cystoscopy with firefly injection today.  Meanwhile continue with IV Zosyn, IV fluids and IV pain control     Staph epidermidis bacteremia ? Contaminant, repeat blood cultures negative so far.  Continue with IV Zosyn for now.   Morbid obesity Body mass index is 58.11 kg/m. Increased risk of mortality and morbidity.    Essential hypertension -Blood pressure parameters continue to be well controlled    Hypothyroidism Check tsh.  Continue with Synthroid.   Anxiety and depression Continue with home medications.   History of PCOS Patient will probably need to be on metformin on discharge.   Chest tightness  Resolved.  EKG NSR.  Continue to monitor.    Hypokalemia:  Replaced.   Leukocytosis  Resolved   DVT prophylaxis: (Lovenox/) Code Status: (Full code) Family Communication: no family at bedside.  Disposition:   Status is: Inpatient  Remains inpatient appropriate because: IV antibiotics.        Consultants:  Surgery.   Procedures: none.   Antimicrobials:  Antibiotics Given (last 72 hours)     Date/Time Action Medication Dose Rate   09/14/21 0828 New Bag/Given   piperacillin-tazobactam (ZOSYN) IVPB 3.375 g 3.375 g 12.5 mL/hr   09/14/21 1836 New Bag/Given   piperacillin-tazobactam (ZOSYN) IVPB 3.375 g 3.375 g 12.5 mL/hr   09/14/21 2350 New Bag/Given   piperacillin-tazobactam (ZOSYN) IVPB 3.375 g 3.375 g 12.5 mL/hr   09/15/21 5053 New  Bag/Given   piperacillin-tazobactam (ZOSYN) IVPB 3.375 g 3.375 g 12.5 mL/hr   09/15/21 1605 New Bag/Given   piperacillin-tazobactam (ZOSYN) IVPB 3.375 g 3.375 g 12.5 mL/hr   09/16/21 0036 New Bag/Given   piperacillin-tazobactam (ZOSYN) IVPB 3.375 g 3.375 g 12.5 mL/hr   09/16/21 0802 New Bag/Given   piperacillin-tazobactam  (ZOSYN) IVPB 3.375 g 3.375 g 12.5 mL/hr   09/16/21 1430 Given   metroNIDAZOLE (FLAGYL) tablet 1,000 mg 1,000 mg    09/16/21 1550 New Bag/Given   piperacillin-tazobactam (ZOSYN) IVPB 3.375 g 3.375 g 12.5 mL/hr   09/16/21 1551 Given   metroNIDAZOLE (FLAGYL) tablet 1,000 mg 1,000 mg    09/16/21 2008 Given   metroNIDAZOLE (FLAGYL) tablet 1,000 mg 1,000 mg    09/16/21 2343 New Bag/Given   piperacillin-tazobactam (ZOSYN) IVPB 3.375 g 3.375 g 12.5 mL/hr   09/17/21 0701 New Bag/Given   piperacillin-tazobactam (ZOSYN) IVPB 3.375 g 3.375 g 12.5 mL/hr         Subjective: 2 bowel movements earlier this morning, pain controlled with IV pain medication.  Objective: Vitals:   09/16/21 0350 09/16/21 1431 09/16/21 2115 09/17/21 0353  BP: 129/81 129/75 (!) 136/97 126/76  Pulse: 76 74 68 82  Resp: Temp: 98.4 F (36.9 C) 98.6 F (37 C) 97.7 F (36.5 C) 98 F (36.7 C)  TempSrc: Oral Oral Axillary Oral  SpO2: 98% 98% 97% 97%  Weight:      Height:        Intake/Output Summary (Last 24 hours) at 09/17/2021 0820 Last data filed at 09/17/2021 0300 Gross per 24 hour  Intake 1131.06 ml  Output --  Net 1131.06 ml    Filed Weights   09/11/21 0303  Weight: (!) 163.3 kg    Examination:   General exam: Morbidly obese lady not in any kind of distress Respiratory system: Clear to auscultation. Respiratory effort normal. Cardiovascular system: S1 & S2 heard, RRR. No JVD,  No pedal edema. Gastrointestinal system: Abdomen is soft with mild generalized tenderness, abdominal distention present normal bowel sounds heard. Central nervous system: Alert and oriented. No focal neurological deficits. Extremities: Symmetric 5 x 5 power. Skin: No rashes, lesions or ulcers Psychiatry:  Mood & affect appropriate.          Data Reviewed: I have personally reviewed following labs and imaging studies  CBC: Recent Labs  Lab 09/11/21 0400 09/13/21 0539 09/14/21 0748 09/15/21 0533  09/17/21 0522  WBC 5.4 10.3 14.1* 10.9* 8.6  NEUTROABS 3.3  --   --   --  5.1  HGB 14.8 14.7 15.0 14.6 13.6  HCT 45.4 43.3 45.8 44.0 41.4  MCV 95.8 95.2 96.4 95.2 95.8  PLT 203 154 186 197 193     Basic Metabolic Panel: Recent Labs  Lab 09/11/21 0400 09/13/21 0539 09/15/21 0533 09/16/21 0850 09/16/21 1256 09/17/21 0522  NA 139 133* 133*  --  136 136  K 3.5 3.4* 3.4*  --  3.9 3.6  CL 105 101 103  --  102 105  CO2 --  25 24  GLUCOSE 113* 91 85  --  106* 91  BUN 9 8 5*  --  <5* <5*  CREATININE 0.75 0.77 0.81  --  0.77 0.60  CALCIUM 9.5 8.4* 8.4*  --  8.7* 8.9  MG  --   --   --  1.9  --   --   PHOS  --   --   --  2.9  --   --      GFR: Estimated Creatinine Clearance: 154.9 mL/min (by C-G formula based on SCr of 0.6 mg/dL).  Liver Function Tests: Recent Labs  Lab 09/11/21 0400  AST 16  ALT 17  ALKPHOS 64  BILITOT 0.7  PROT 7.7  ALBUMIN 4.1     CBG: No results for input(s): GLUCAP in the last 168 hours.   Recent Results (from the past 240 hour(s))  Blood culture (routine x 2)     Status: Abnormal   Collection Time: 09/11/21  6:42 AM   Specimen: BLOOD  Result Value Ref Range Status   Specimen Description   Final    BLOOD BLOOD LEFT FOREARM Performed at [facility], [address] [place], [place] [address]    Special Requests   Final    Blood Culture adequate volume BOTTLES DRAWN AEROBIC AND ANAEROBIC Performed at [facility], [address] [place], [place] [address]    Culture  Setup Time   Final    GRAM POSITIVE COCCI IN BOTH AEROBIC AND ANAEROBIC BOTTLES CRITICAL RESULT CALLED TO, READ BACK BY AND VERIFIED WITH: PHARMD K.SHADE AT 1213 ON 09/12/2021 BY T.SAAD.    Culture (A)  Final    STAPHYLOCOCCUS EPIDERMIDIS THE SIGNIFICANCE OF ISOLATING THIS ORGANISM FROM A SINGLE VENIPUNCTURE CANNOT BE PREDICTED WITHOUT FURTHER CLINICAL AND CULTURE CORRELATION. SUSCEPTIBILITIES AVAILABLE ONLY ON  REQUEST. Performed at [facility], [address] [place], [place] [address]    Report Status 09/14/2021 FINAL  Final  Blood Culture ID Panel (Reflexed)     Status: Abnormal   Collection Time: 09/11/21  6:42 AM  Result Value Ref Range Status   Enterococcus faecalis NOT DETECTED NOT DETECTED Final   Enterococcus Faecium NOT DETECTED NOT DETECTED Final   Listeria monocytogenes NOT DETECTED NOT DETECTED Final   Staphylococcus species DETECTED (A) NOT DETECTED Final    Comment: CRITICAL RESULT CALLED TO, READ BACK BY AND VERIFIED WITH: PHARMD K.SHADE AT 1213 ON 09/12/2021 BY T.SAAD.    Staphylococcus aureus (BCID) NOT DETECTED NOT DETECTED Final   Staphylococcus epidermidis DETECTED (A) NOT DETECTED Final    Comment: CRITICAL RESULT CALLED TO, READ BACK BY AND VERIFIED WITH: PHARMD K.SHADE AT 1213 ON 09/12/2021 BY T.SAAD.    Staphylococcus lugdunensis NOT DETECTED NOT DETECTED Final   Streptococcus species NOT DETECTED NOT DETECTED Final   Streptococcus agalactiae NOT DETECTED NOT DETECTED Final   Streptococcus pneumoniae NOT DETECTED NOT DETECTED Final   Streptococcus pyogenes NOT DETECTED NOT DETECTED Final   A.calcoaceticus-baumannii NOT DETECTED NOT DETECTED Final   Bacteroides fragilis NOT DETECTED NOT DETECTED Final   Enterobacterales NOT DETECTED NOT DETECTED Final   Enterobacter cloacae complex NOT DETECTED NOT DETECTED Final   Escherichia coli NOT DETECTED NOT DETECTED Final   Klebsiella aerogenes NOT DETECTED NOT DETECTED Final   Klebsiella oxytoca NOT DETECTED NOT DETECTED Final   Klebsiella pneumoniae NOT DETECTED NOT DETECTED Final   Proteus species NOT DETECTED NOT DETECTED Final   Salmonella species NOT DETECTED NOT DETECTED Final   Serratia marcescens NOT DETECTED NOT DETECTED Final   Haemophilus influenzae NOT DETECTED NOT DETECTED Final   Neisseria meningitidis NOT DETECTED NOT DETECTED Final   Pseudomonas aeruginosa NOT DETECTED NOT DETECTED Final    Stenotrophomonas maltophilia NOT DETECTED NOT DETECTED Final   Candida albicans NOT DETECTED NOT DETECTED Final   Candida auris NOT DETECTED NOT DETECTED Final   Candida glabrata NOT DETECTED NOT DETECTED Final  Candida krusei NOT DETECTED NOT DETECTED Final   Candida parapsilosis NOT DETECTED NOT DETECTED Final   Candida tropicalis NOT DETECTED NOT DETECTED Final   Cryptococcus neoformans/gattii NOT DETECTED NOT DETECTED Final   Methicillin resistance mecA/C NOT DETECTED NOT DETECTED Final    Comment: Performed at [facility], [address], [place], [place] [address]  Resp Panel by RT-PCR (Flu A&B, Covid) Nasopharyngeal Swab     Status: None   Collection Time: 09/11/21 10:45 AM   Specimen: Nasopharyngeal Swab; Nasopharyngeal(NP) swabs in vial transport medium  Result Value Ref Range Status   SARS Coronavirus 2 by RT PCR NEGATIVE NEGATIVE Final    Comment: (NOTE) SARS-CoV-2 target nucleic acids are NOT DETECTED.  The SARS-CoV-2 RNA is generally detectable in upper respiratory specimens during the acute phase of infection. The lowest concentration of SARS-CoV-2 viral copies this assay can detect is 138 copies/mL. A negative result does not preclude SARS-Cov-2 infection and should not be used as the sole basis for treatment or other patient management decisions. A negative result may occur with  improper specimen collection/handling, submission of specimen other than nasopharyngeal swab, presence of viral mutation(s) within the areas targeted by this assay, and inadequate number of viral copies(<138 copies/mL). A negative result must be combined with clinical observations, patient history, and epidemiological information. The expected result is Negative.  Fact Sheet for Patients:  BloggerCourse.com  Fact Sheet for Healthcare Providers:  SeriousBroker.it  This test is no t yet approved or cleared by the [place] FDA and   has been authorized for detection and/or diagnosis of SARS-CoV-2 by FDA under an Emergency Use Authorization (EUA). This EUA will remain  in effect (meaning this test can be used) for the duration of the COVID-19 declaration under Section 564(b)(1) of the Act, 21 U.S.C.section 360bbb-3(b)(1), unless the authorization is terminated  or revoked sooner.       Influenza A by PCR NEGATIVE NEGATIVE Final   Influenza B by PCR NEGATIVE NEGATIVE Final    Comment: (NOTE) The Xpert Xpress SARS-CoV-2/FLU/RSV plus assay is intended as an aid in the diagnosis of influenza from Nasopharyngeal swab specimens and should not be used as a sole basis for treatment. Nasal washings and aspirates are unacceptable for Xpert Xpress SARS-CoV-2/FLU/RSV testing.  Fact Sheet for Patients: BloggerCourse.com  Fact Sheet for Healthcare Providers: SeriousBroker.it  This test is not yet approved or cleared by the [place] FDA and has been authorized for detection and/or diagnosis of SARS-CoV-2 by FDA under an Emergency Use Authorization (EUA). This EUA will remain in effect (meaning this test can be used) for the duration of the COVID-19 declaration under Section 564(b)(1) of the Act, 21 U.S.C. section 360bbb-3(b)(1), unless the authorization is terminated or revoked.  Performed at [facility], [address], [place], [place] [address]   Blood culture (routine x 2)     Status: Abnormal   Collection Time: 09/11/21  3:52 PM   Specimen: BLOOD RIGHT HAND  Result Value Ref Range Status   Specimen Description   Final    BLOOD RIGHT HAND Performed at [facility], [address] [place], [place] [address]    Special Requests   Final    BOTTLES DRAWN AEROBIC AND ANAEROBIC Blood Culture adequate volume Performed at [facility], [address], [place], [place] [address]    Culture  Setup Time    Final    GRAM POSITIVE COCCI IN CLUSTERS ANAEROBIC BOTTLE ONLY CRITICAL RESULT  CALLED TO, READ BACK BY AND VERIFIED WITH: Damaris Hippo PHARMD, AT [phone] 09/13/21 D. Leighton Roach    Culture (A)  Final    STAPHYLOCOCCUS HOMINIS THE SIGNIFICANCE OF ISOLATING THIS ORGANISM FROM A SINGLE SET OF BLOOD CULTURES WHEN MULTIPLE SETS ARE DRAWN IS UNCERTAIN. PLEASE NOTIFY THE MICROBIOLOGY DEPARTMENT WITHIN ONE WEEK IF SPECIATION AND SENSITIVITIES ARE REQUIRED. Performed at [facility], [address], [place], [place] [address]    Report Status 09/15/2021 FINAL  Final  Culture, blood (Routine X 2) w Reflex to ID Panel     Status: None (Preliminary result)   Collection Time: 09/11/21  5:14 PM   Specimen: BLOOD  Result Value Ref Range Status   Specimen Description   Final    BLOOD BLOOD LEFT HAND Performed at [facility], [address] [place], [place] [address]    Special Requests   Final    BOTTLES DRAWN AEROBIC ONLY Blood Culture results may not be optimal due to an inadequate volume of blood received in culture bottles Performed at [facility], [address], [place], [place] [address]    Culture   Final    NO GROWTH 4 DAYS Performed at [facility], [address] [place], [place] [address]    Report Status PENDING  Incomplete  Culture, blood (Routine X 2) w Reflex to ID Panel     Status: None (Preliminary result)   Collection Time: 09/14/21 12:40 PM   Specimen: BLOOD  Result Value Ref Range Status   Specimen Description   Final    BLOOD RIGHT ANTECUBITAL Performed at [facility], [address] [place], [place] [address]    Special Requests   Final    BOTTLES DRAWN AEROBIC ONLY Blood Culture adequate volume Performed at [facility], [address] [place], [place] [address]    Culture   Final    NO GROWTH 2 DAYS Performed at [facility], [address], [place], [place] [address]     Report Status PENDING  Incomplete  Culture, blood (Routine X 2) w Reflex to ID Panel     Status: None (Preliminary result)   Collection Time: 09/14/21 12:40 PM   Specimen: BLOOD  Result Value Ref Range Status   Specimen Description   Final    BLOOD BLOOD LEFT HAND Performed at [facility], [address] [place], [place] [address]    Special Requests   Final    BOTTLES DRAWN AEROBIC ONLY Blood Culture adequate volume Performed at [facility], [address], [place], [place] [address]    Culture   Final    NO GROWTH 2 DAYS Performed at [facility], [address], [place], [place] [address]    Report Status PENDING  Incomplete          Radiology Studies: No results found.      Scheduled Meds:  acetaminophen  1,000 mg Oral On Call to OR   alvimopan  12 mg Oral On Call to OR   bupivacaine liposome  20 mL Infiltration Once   celecoxib  200 mg Oral On Call to OR   cyclobenzaprine  10 mg Oral QHS   enoxaparin (LOVENOX) injection  40 mg Subcutaneous On Call   gabapentin  300 mg Oral On Call to OR   levothyroxine  88 mcg Oral QAC breakfast   medroxyPROGESTERone  10 mg Oral Daily  pantoprazole  40 mg Oral Daily   potassium chloride  40 mEq Oral BID   Continuous Infusions:  sodium chloride Stopped (09/16/21 0036)   anidulafungin     cefoTEtan (CEFOTAN) IV     lactated ringers     methocarbamol (ROBAXIN) IV 500 mg (09/17/21 0402)   piperacillin-tazobactam (ZOSYN)  IV 3.375 g (09/17/21 0701)     LOS: 6 days        Kathlen Mody, MD [facility]   To contact the attending provider between 7A-7P or the covering provider during after hours 7P-7A, please log into the web site [email or web address] and access using universal [place] password for that web site. If you do not have the password, please call the hospital operator.  09/17/2021, 8:20 AM

## 2021-09-17 NOTE — Op Note (Signed)
09/17/2021  7:07 PM  PATIENT:  Lori Mcknight  35 y.o. 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  This assistant was needed for exposure, dissection, suction, tissue approximation, retraction, perception, etc.  ANESTHESIA:     General  Regional TRANSVERSUS ABDOMINIS PLANE (TAP) nerve block for perioperative & postoperative pain control provided with liposomal bupivacaine (Experel) mixed with 0.25% bupivacaine as a Bilateral TAP block x 75mL each side at the level of the transverse abdominis & preperitoneal spaces along the flank at the anterior axillary line, from subcostal ridge to iliac crest under laparoscopic guidance   Local field block at port sites & extraction wound  EBL:  No intake/output data recorded.  Delay start of Pharmacological VTE agent (>24hrs) due to surgical blood loss or risk of bleeding:  no  DRAINS: 19 Fr Blake drain goes to the pelvis  SPECIMEN:   RECTOSIGMOID COLON (open end proximal)  DISPOSITION OF SPECIMEN:  PATHOLOGY  COUNTS:  YES  PLAN OF CARE: Admit to inpatient   PATIENT DISPOSITION:  PACU  - hemodynamically stable.  INDICATION:    Patient with numerous hospitalizations with inflammation of the sigmoid colon suspicious for diverticulitis.  Abscess sitting on bladder with questionable possible colovesical fistula.  We have been trending set up for surgical resection but due to financial and other issues that has not happened.  She was readmitted yet again with pain.  Abscess sitting between her sigmoid colon and bladder but no definite gas.  Bowel rest and IV antibiotics.  Abscess not amenable to drainage.  Had worsening pain.  Repeat CAT scan showed increased gas with small bowel nearby.  Partial obstructive symptoms.  Antibiotics adjusted.  Seem to feel little better.  I feel due to the her persistent diverticulitis and difficulty in getting surgery done, I recommended robotic possible open resection.  We will try and see if this is isolated to do resection meeting anastomosis and avoid temporary ostomy but was concerned that increased risk.  She has been on bowel rest and IV antibiotics and her leukocytosis is resolved and her pain has gone down.  However still significant risk of Hartmann resection I recommended segmental resection:  The anatomy & physiology of the digestive tract was discussed.  The pathophysiology was discussed.  Natural history risks without surgery was discussed.   I worked to give an overview of the disease and the frequent need to have multispecialty involvement.  I feel the risks of no intervention will lead to serious problems that outweigh the operative risks; therefore, I recommended a partial colectomy to remove the pathology.  Laparoscopic & open techniques were discussed.  Risks such as bleeding, infection, abscess, leak, reoperation, possible ostomy, hernia, heart attack, death, and other risks were discussed.  I noted a good likelihood this will help address the problem.   Goals of post-operative recovery were discussed as well.  We will work to minimize  complications.  Educational materials on the pathology had been given in the office.  Questions were answered.    The patient expressed understanding & wished to proceed with surgery.  OR FINDINGS:   Patient had large phlegmon of old mentum and mid ileum adherent to a perforated sigmoid colon.  Interloop abscess with feculent peritonitis.  Left adnexa Fallopian tube very dilated with purulence consistent with secondary tubo-ovarian abscess.  Very dense adhesions to the left posterolateral bladder dome with moderate abscess and purulence there as well.  All these 3 abscesses were drained.  Bladder distended and no evidence of any active colovesical fistula.  No evidence of any appendicitis.  Some dilatation consistent with probable partial small bowel obstruction at the site of the small bowel trying to contain the perforated colon with feculent peritonitis.  Therefore no anastomosis done.  End Hartmann resection.  No obvious metastatic disease on visceral parietal peritoneum or liver.  CASE DATA:  Type of patient?: LDOW CASE (Surgical Hospitalist [facility] Inpatient)  Status of Case? URGENT Add On  Infection Present At Time Of Surgery (PATOS)?   Feculent peritonitis, phlegmon, abscesses x3.  DESCRIPTION:   Informed consent was confirmed.  The patient underwent general anaesthesia without difficulty.  I did ask urology to do cystoscopy with immunofluorescent firefly injection of ureters for identification given possible colovesical fistula and chronic recurrent abscesses and phlegmon.  Dr Lafonda Mosses was able to do that without much difficulty.  Please see his separate operative note.  The patient was positioned appropriately.  VTE prevention in place.  The patient was clipped, prepped, & draped in a sterile fashion.  Surgical timeout confirmed our plan.  The patient was positioned in reverse Trendelenburg.  Abdominal entry was tempted using Varess technique at the left subcostal ridge on the anterior  abdominal wall.  However even with the extra long Varess needle I cannot definitely confirm intraperitoneal placement.  Therefore used optical entry to place a 5 Miller port in the right upper quadrant using an extra long bariatric port to good result.  Camera inspection revealed no injury.  Extra ports were carefully placed under direct laparoscopic visualization.  The Intuitive daVinci robot was docked with camera & instruments carefully placed.  The patient was carefully positioned.  Upon entering the abdomen (organ space), I encountered a phlegmon involving the omentum, small bowel, and sigmoid colon.  I freed some greater omentum off and encountered a partially contained pocket of feculent peritonitis and pus.  I freed the greater omentum off of the small bowel and colon.  I then was able to free off loop of small bowel that were densely adherent to the sigmoid colon.  Encountered a massive phlegmon with feculent peritonitis between the terminal ileum and the sigmoid colon and anterior abdominal wall.  Wash the area out.  Confirmed that the appendix was not involved and this was actually middle ileum.  There was some dilatation of small bowel proximal to this with some distal decompression suspicious for partial bowel obstruction. .   I reflected the greater omentum and the upper abdomen the small bowel in the upper abdomen somewhat.  Patient had very dilated bowel with phlegmon so took some extra retraction to be able to see.  However clearly the epicenter was at the mid sigmoid colon with dense adhesions of the colon to the bladder.  Left adnexa involved.      I mobilized the rectosigmoid colon & elevated it to put the main pedicle on tension.  I could see the right ureter and it was in natural retroperitoneal position away from inflammation.  I scored the base of peritoneum of the medial side of the mesentery of the elevated left colon from the ligament of Treitz to the mid rectum.   I elevated the  sigmoid mesentery and entered into the retro-mesenteric plane. We were able to identify the left ureter and gonadal vessels. We kept those posterior within the retroperitoneum and elevated the left colon mesentery off that. I did isolate the inferior mesenteric artery (IMA) pedicle but did not ligate it yet.  I continued distally and got into the avascular plane posterior to the mesorectum, sparing the nervi ergentes.. This allowed me to help mobilize the rectum as well by freeing the mesorectum off the sacrum.  I stayed away from the right and left ureters.  I kept the lateral vascular pedicles to the rectum intact.  I skeletonized the lymph nodes off the inferior mesenteric artery pedicle.  I went down to its takeoff from the aorta.   I isolated the inferior mesenteric vein off of the ligament of Treitz just cephalad to that as well.  After confirming the left ureter was out of the way, I went ahead and ligated the inferior mesenteric artery pedicle just near its takeoff from the aorta.  I did ligate the inferior mesenteric vein in a similar fashion.  We ensured hemostasis.  I continued medial to lateral dissection to free the left colon mesentery off the retroperitoneum going up towards the splenic flexure to allow good mobility and protect the colon mesentery.  I mobilized the left colon in a lateral to medial fashion off the retroperitoneum and sidewall attachments along the line of Toldt up towards the splenic flexure to ensure good mobilization of the remaining left colon to reach into the pelvis.   We then focused on mesorectal dissection.  Freed off the lateral rectosigmoid off the left adnexa and encountered an obvious abscess involving the left fallopian tube.  Opened and decompressed that.  That allowed better visualization.  After confirming the left ureter was out of the way mobilized the rectosigmoid colon off the left Pelvis.  This left adhesions to the left lateral dome of the bladder.  I was  able to come through careful blunt focused dissection to eventually free it down.  I encountered another abscess cavity of the bladder with frank purulence.  Middle decompressed.  We washed out and assured hemostasis.  Freed the mesorectum off the presacral plane until I was distal to the concerning region.  Freed off peritoneum on the lateral sidewalls as well and transected the mesentery of the lateral pedicles to get distal to the area of concern.  Came around anteriorly such that I had good circumferential mesorectal excision and a good margin distal to the area of concern.  I chose a region at the descending/sigmoid junction that was soft and easily reached down to the rectal stump.  I transected the mesentery of the colon radially to preserve remaining colon blood supply.  I skeletonized the mesorectum and transected at the proximal rectum using a robotic stapler.    Because I encountered 3 abscesses with feculent peritonitis, I did not feel it was safe for immediate anastomosis.  I therefore plan to do [facility] resection with end colostomy.  I placed 0 Prolene suture on the  rectal stump in each corner to help incorporate the corners down as well to good result.  Stump laid just distal to the sacral promontory. We then chose a region for the proximal margin that would reach well for a future planned anastomosis.  Transected the colon mesentery radially to preserve good collateral and marginal artery blood supply.  However did not seem like the colon will reach down the pelvis well let alone through her very thick anterior abdominal wall.  I therefore decided to do formal splenic flexure mobilization.  Patient repositioned in reverse Trendelenburg.  Freed the greater omentum off the mid transverse colon and continued distally to help mobilize the splenic flexure in a superior to inferior fashion.  Alternated from mobilizing the proximal descending colon and distal transverse colon at the splenic flexure.   Came around and freed off the left upper retroperitoneal attachments.  Freed the distal transverse colon off to its retroperitoneal attachments along the inferior pericardiac ridge.  With this I got good mobilization of the left colon.  That helped it to reach much better.  This made it more likely that future anastomosis could happen and there would not be tension bringing up the colostomy through her thick abdominal wall.  We did copious irrigation of several liters.  confirm ureters not injured.  Good hemostasis.  Drain placed down the pelvis with the tip going into the left bladder abscess cavity.  We did have the OR team insufflate the bladder with methylene blue dyed isotonic fluid.  Got distention to 350 mL with good bladder distention.  No leaking of methylene blue or drainage at the abscess cavity.  Reassuring.  Bladder decompressed.  I decide to hold off on any more aggressive suturing of the abscess cavity and let it drain.  I had mobilized the greater omentum to get her to reach down into the deep pelvis.  I did a final irrigation of antibiotic solution (clindamycin/gentamicin)  Held that in place.  Drain secured to the skin.  We created an extraction incision through a left upper quadrant paramedian incision at the site premarked for end colostomy.  Did a 3 cm circular excision of a disc of skin and excised the cone of subcutaneous fat all the way down to her rectus muscle.  Opened up the rectus transversely.  Came through the rectus muscle and posterior rectus fascia vertically and placed a wound protector.  Brought the end of the rectum up through the wound.  Took some time to get it through.  I ended up having to transect through some of the rectus muscle to release enough so that the rectosigmoid colon with its phlegmon could come up.  Eventually eviscerated confirmed good healthy descending colon that could come out 10 cm through her thick abdominal wall.  Allowed somewhat to reduce back in.  I  transected the colon and clamped it just proximally with a cautery.  Got good healthy bleeding mucosa.  Pink and viable.  Endoluminal gas was evacuated.  Ports & wound protector removed.  We aspirated the antibiotic irrigation through the surgical drain.  Thin and serosanguineous..  Sterile unused instruments were used from this point.  I closed the skin at the port sites using Monocryl stitch and sterile dressing.  After that was done, I matured the colostomy using 3-0 Vicryl interrupted sutures to have a good flat colostomy.  Finger intubation went through the fascia without any twisting or torsion.  Mesentery at the inferior "6:00" position.  Colostomy appliance placed.  Patient extubated and sent to recovery room.  We will see how she does on the colectomy pathway.  ERAS protocol.  Colostomy training.  Continue antibiotics for 5 more days.  Low threshold to do CT scan if has recurrent infection since she would be high risk for abscess.  Hopefully can do colostomy takedown in 3-6 months if she recovers.  Ideally would get colonoscopy before that happens   I had discussed postop care with the patient in detail the office & in the holding area. Instructions are written. I discussed operative findings, updated the patient's status, discussed probable steps to recovery, and gave postoperative recommendations to the patient's sister , Cherita.  Recommendations were made.  Questions were answered.  She expressed understanding & appreciation.  Ardeth Sportsman, M.D., F.A.C.S. Gastrointestinal and Minimally Invasive Surgery [facility] [address] [place], [place] [address] [phone] Main / Paging

## 2021-09-17 NOTE — Anesthesia Procedure Notes (Signed)
Procedure Name: Intubation Date/Time: 09/17/2021 2:49 PM Performed by: Elyn Peers, CRNA Pre-anesthesia Checklist: Patient identified, Emergency Drugs available, Suction available, Patient being monitored and Timeout performed Patient Re-evaluated:Patient Re-evaluated prior to induction Oxygen Delivery Method: Circle system utilized Preoxygenation: Pre-oxygenation with 100% oxygen Induction Type: IV induction Ventilation: Mask ventilation without difficulty Laryngoscope Size: 3 and Glidescope Grade View: Grade I Tube type: Oral Tube size: 7.5 mm Number of attempts: 1 Airway Equipment and Method: Rigid stylet and Video-laryngoscopy Placement Confirmation: ETT inserted through vocal cords under direct vision, positive ETCO2 and breath sounds checked- equal and bilateral Secured at: 22 cm Tube secured with: Tape Dental Injury: Teeth and Oropharynx as per pre-operative assessment

## 2021-09-17 NOTE — Progress Notes (Signed)
Spoke to Enders, Charity fundraiser and received report for surgery. Requested eras drugs be given around 1200 today.  Nurse voiced understanding.  CHG to be done this morning also.

## 2021-09-17 NOTE — H&P (View-Only) (Signed)
Day of Surgery  Subjective: CC: Patient reports stable abdominal pain that is mainly in the left lower quadrant.  She rates this as a 5-6/10 for pain medication a 3-4/10 after pain medications.  Last episode of flatus was 2 days ago.  She did drink MiraLAX bowel prep yesterday and had ~5 liquid bm's yesterday with the last at 10pm. No n/v.  Denies any pneumaturia or flecks of stool in her urine. Voiding without difficulty.   Objective: Vital signs in last 24 hours: Temp:  [97.7 F (36.5 C)-98.6 F (37 C)] 98 F (36.7 C) (12/06 0353) Pulse Rate:  [68-82] 82 (12/06 0353) Resp:  [18-20] 20 (12/06 0353) BP: (126-136)/(75-97) 126/76 (12/06 0353) SpO2:  [97 %-98 %] 97 % (12/06 0353) Last BM Date: 09/16/21  Intake/Output from previous day: 12/05 0701 - 12/06 0700 In: 1131.1 [P.O.:240; I.V.:546.9; IV Piggyback:344.2] Out: -  Intake/Output this shift: No intake/output data recorded.  PE: Gen:  Alert, NAD, pleasant Card:  RRR Pulm:  CTAB, no W/R/R, effort normal Abd: Obese, soft, ND, LLQ tenderness without peritonitis, +BS, ostomy markings present in right and left mid abdomen.  Psych: A&Ox3  Skin: no rashes noted, warm and dry  Lab Results:  Recent Labs    09/15/21 0533 09/17/21 0522  WBC 10.9* 8.6  HGB 14.6 13.6  HCT 44.0 41.4  PLT 197 193   BMET Recent Labs    09/16/21 1256 09/17/21 0522  NA 136 136  K 3.9 3.6  CL 102 105  CO2 25 24  GLUCOSE 106* 91  BUN <5* <5*  CREATININE 0.77 0.60  CALCIUM 8.7* 8.9   PT/INR No results for input(s): LABPROT, INR in the last 72 hours. CMP     Component Value Date/Time   NA 136 09/17/2021 0522   NA 141 12/09/2016 1113   K 3.6 09/17/2021 0522   CL 105 09/17/2021 0522   CO2 24 09/17/2021 0522   GLUCOSE 91 09/17/2021 0522   BUN <5 (L) 09/17/2021 0522   BUN 10 12/09/2016 1113   CREATININE 0.60 09/17/2021 0522   CREATININE 0.84 10/20/2016 1205   CALCIUM 8.9 09/17/2021 0522   PROT 7.7 09/11/2021 0400   PROT 7.8  11/12/2016 1042   ALBUMIN 4.1 09/11/2021 0400   ALBUMIN 4.0 11/12/2016 1042   AST 16 09/11/2021 0400   ALT 17 09/11/2021 0400   ALKPHOS 64 09/11/2021 0400   BILITOT 0.7 09/11/2021 0400   BILITOT 0.6 11/12/2016 1042   GFRNONAA >60 09/17/2021 0522   GFRNONAA >89 10/20/2016 1205   GFRAA >60 03/31/2020 0543   GFRAA >89 10/20/2016 1205   Lipase     Component Value Date/Time   LIPASE 42 09/11/2021 0400    Studies/Results: No results found.  Anti-infectives: Anti-infectives (From admission, onward)    Start     Dose/Rate Route Frequency Ordered Stop   09/17/21 1400  anidulafungin (ERAXIS) 100 mg in sodium chloride 0.9 % 100 mL IVPB        100 mg 78 mL/hr over 100 Minutes Intravenous Every 24 hours 09/16/21 1352 09/21/21 1359   09/17/21 0600  cefoTEtan (CEFOTAN) 2 g in sodium chloride 0.9 % 100 mL IVPB        2 g 200 mL/hr over 30 Minutes Intravenous On call to O.R. 09/16/21 0835 09/18/21 0559   09/16/21 1500  anidulafungin (ERAXIS) 200 mg in sodium chloride 0.9 % 200 mL IVPB        200 mg 78 mL/hr over 200 Minutes  Intravenous  Once 09/16/21 1400 09/16/21 2338   09/16/21 1400  neomycin (MYCIFRADIN) tablet 1,000 mg  Status:  Discontinued       See Hyperspace for full Linked Orders Report.   1,000 mg Oral 3 times per day 09/16/21 0835 09/16/21 1107   09/16/21 1400  metroNIDAZOLE (FLAGYL) tablet 1,000 mg       See Hyperspace for full Linked Orders Report.   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 Michaell Cowing for robotic assisted sigmoid colectomy.  We are also seeing if urology can do cystoscopy with firefly injection.  ERAS protocol   FEN - NPO, IVF VTE - SCDs, Pre-Op Lovenox ID - Eraxis/Zosyn currently.     LOS: 6 days    Jacinto Halim , [facility] 09/17/2021, 9:13 AM Please see Amion for pager number during day hours 7:00am-4:30pm

## 2021-09-17 NOTE — Transfer of Care (Signed)
Immediate Anesthesia Transfer of Care Note  Patient: [name]  Procedure(s) Performed: XI ROBOT ASSISTED SIGMOID COLECTOMY WITH END COLOSTOMY AND BILATERAL TAP BLOCK, LYSIS OF ADHESIONS, DRAINAGE OF ABSCESSES X3 (Abdomen) CYSTOSCOPY with FIREFLY INJECTION (Bladder)  Patient Location: PACU  Anesthesia Type:General  Level of Consciousness: drowsy  Airway & Oxygen Therapy: Patient Spontanous Breathing and Patient connected to nasal cannula oxygen  Post-op Assessment: Report given to RN and Post -op Vital signs reviewed and stable  Post vital signs: Reviewed and stable  Last Vitals:  Vitals Value Taken Time  BP 142/109 09/17/21 1915  Temp    Pulse 88 09/17/21 1919  Resp 29 09/17/21 1919  SpO2 96 % 09/17/21 1919  Vitals shown include unvalidated device data.  Last Pain:  Vitals:   09/17/21 1256  TempSrc:   PainSc: 0-No pain      Patients Stated Pain Goal: 1 (09/17/21 0034)  Complications: No notable events documented.

## 2021-09-17 NOTE — Discharge Instructions (Signed)
#######################################################  Ostomy Support Information  You've heard that people get along just fine with only one of their eyes, or one of their lungs, or one of their kidneys. But you also know that you have only one intestine and only one bladder, and that leaves you feeling awfully empty, both physically and emotionally: You think no other people go around without part of their intestine with the ends of their intestines sticking out through their abdominal walls.   YOU ARE NOT ALONE.  There are nearly three quarters of a million people in the [place] who have an ostomy; people who have had surgery to remove all or part of their colons or bladders.   There is even a national association, the [place] [employer] [place] with over 350 local affiliated support groups that are organized by volunteers who provide peer support and counseling. [facility] has a toll free telephone num-ber, [phone] and an educational, interactive website, www.ostomy.org   An ostomy is an opening in the belly (abdominal wall) made by surgery. Ostomates are people who have had this procedure. The opening (stoma) allows the kidney or bowel to grdischarge waste. An external pouch covers the stoma to collect waste. Pouches are are a simple bag and are odor free. Different companies have disposable or reusable pouches to fit one's lifestyle. An ostomy can either be temporary or permanent.   THERE ARE THREE MAIN TYPES OF OSTOMIES Colostomy. A colostomy is a surgically created opening in the large intestine (colon). Ileostomy. An ileostomy is a surgically created opening in the small intestine. Urostomy. A urostomy is a surgically created opening to divert urine away from the bladder.  OSTOMY Care  The following guidelines will make care of your colostomy easier. Keep this information close by for quick reference.  Helpful DIET hints Eat a well-balanced diet including vegetables and fresh  fruits. Eat on a regular schedule.  Drink at least 6 to 8 glasses of fluids daily. Eat slowly in a relaxed atmosphere. Chew your food thoroughly. Avoid chewing gum, smoking, and drinking from a straw. This will help decrease the amount of air you swallow, which may help reduce gas. Eating yogurt or drinking buttermilk may help reduce gas.  To control gas at night, do not eat after 8 p.m. This will give your bowel time to quiet down before you go to bed.  If gas is a problem, you can purchase Beano. Sprinkle Beano on the first bite of food before eating to reduce gas. It has no flavor and should not change the taste of your food. You can buy Beano over the counter at your local drugstore.  Foods like fish, onions, garlic, broccoli, asparagus, and cabbage produce odor. Although your pouch is odor-proof, if you eat these foods you may notice a stronger odor when emptying your pouch. If this is a concern, you may want to limit these foods in your diet.  If you have an ileostomy, you will have chronic diarrhea & need to drink more liquids to avoid getting dehydrated.  Consider antidiarrheal medicine like imodium (loperamide) or Lomotil to help slow down bowel movements / diarrhea into your ileostomy bag.  GETTING TO GOOD BOWEL HEALTH WITH AN ILEOSTOMY    With the colon bypassed & not in use, you will have small bowel diarrhea.   It is important to thicken & slow your bowel movements down.   The goal: 4-6 small BOWEL MOVEMENTS A DAY It is important to drink plenty of liquids to avoid  getting dehydrated  CONTROLLING ILEOSTOMY DIARRHEA  TAKE A FIBER SUPPLEMENT (FiberCon or Benefiner soluble fiber) twice a day - to thicken stools by absorbing excess fluid and retrain the intestines to act more normally.  Slowly increase the dose over a few weeks.  Too much fiber too soon can backfire and cause cramping & bloating.  TAKE AN IRON SUPPLEMENT twice a day to naturally constipate your bowels.  Usually  ferrous sulfate 326m twice a day)  TAKE ANTI-DIARRHEAL MEDICINES: Loperamide (Imodium) can slow down diarrhea.  Start with two tablets (= 4563m first and then try one tablet every 6 hours.  Can go up to 2 pills four times day (8 pills of 63m16max) Avoid if you are having fevers or severe pain.  If you are not better or start feeling worse, stop all medicines and call your doctor for advice LoMotil (Diphenoxylate / Atropine) is another medicine that can constipate & slow down bowel moevements Pepto Bismol (bismuth) can gently thicken bowels as well  If diarrhea is worse,: drink plenty of liquids and try simpler foods for a few days to avoid stressing your intestines further. Avoid dairy products (especially milk & ice cream) for a short time.  The intestines often can lose the ability to digest lactose when stressed. Avoid foods that cause gassiness or bloating.  Typical foods include beans and other legumes, cabbage, broccoli, and dairy foods.  Every person has some sensitivity to other foods, so listen to our body and avoid those foods that trigger problems for you.Call your doctor if you are getting worse or not better.  Sometimes further testing (cultures, endoscopy, X-ray studies, bloodwork, etc) may be needed to help diagnose and treat the cause of the diarrhea. Take extra anti-diarrheal medicines (maximum is 8 pills of 63mg66mperamide a day)   Tips for POUCHING an OSTOMY   Changing Your Pouch The best time to change your pouch is in the morning, before eating or drinking anything. Your stoma can function at any time, but it will function more after eating or drinking.   Applying the pouching system  Place all your equipment close at hand before removing your pouch.  Wash your hands.  Stand or sit in front of a mirror. Use the position that works best for you. Remember that you must keep the skin around the stoma wrinkle-free for a good seal.  Gently remove the used pouch (1-piece  system) or the pouch and old wafer (2-piece system). Empty the pouch into the toilet. Save the closure clip to use again.  Wash the stoma itself and the skin around the stoma. Your stoma may bleed a little when being washed. This is normal. Rinse and pat dry. You may use a wash cloth or soft paper towels (like Bounty), mild soap (like Dial, Safeguard, or IvorMongoliand water. Avoid soaps that contain perfumes or lotions.  For a new pouch (1-piece system) or a new wafer (2-piece system), measure your stoma using the stoma guide in each box of supplies.  Trace the shape of your stoma onto the back of the new pouch or the back of the new wafer. Cut out the opening. Remove the paper backing and set it aside.  Optional: Apply a skin barrier powder to surrounding skin if it is irritated (bare or weeping), and dust off the excess. Optional: Apply a skin-prep wipe (such as Skin Prep or All-Kare) to the skin around the stoma, and let it dry. Do not apply this solution if the  skin is irritated (red, tender, or broken) or if you have shaved around the stoma. Optional: Apply a skin barrier paste (such as Stomahesive, Coloplast, or Premium) around the opening cut in the back of the pouch or wafer. Allow it to dry for 30 to 60 seconds.  Hold the pouch (1-piece system) or wafer (2-piece system) with the sticky side toward your body. Make sure the skin around the stoma is wrinkle-free. Center the opening on the stoma, then press firmly to your abdomen (Fig. 4). Look in the mirror to check if you are placing the pouch, or wafer, in the right position. For a 2-piece system, snap the pouch onto the wafer. Make sure it snaps into place securely.  Place your hand over the stoma and the pouch or wafer for about 30 seconds. The heat from your hand can help the pouch or wafer stick to your skin.  Add deodorant (such as Super Banish or Nullo) to your pouch. Other options include food extracts such as vanilla oil and peppermint  extract. Add about 10 drops of the deodorant to the pouch. Then apply the closure clamp. Note: Do not use toxic  chemicals or commercial cleaning agents in your pouch. These substances may harm the stoma.  Optional: For extra seal, apply tape to all 4 sides around the pouch or wafer, as if you were framing a picture. You may use any brand of medical adhesive tape. Change your pouch every 5 to 7 days. Change it immediately if a leak occurs.  Wash your hands afterwards.  If you are wearing a 2-piece system, you may use 2 new pouches per week and alternate them. Rinse the pouch with mild soap and warm water and hang it to dry for the next day. Apply the fresh pouch. Alternate the 2 pouches like this for a week. After a week, change the wafer and begin with 2 new pouches. Place the old pouches in a plastic bag, and put them in the trash.   LIVING WITH AN OSTOMY  Emptying Your Pouch Empty your pouch when it is one-third full (of urine, stool, and/or gas). If you wait until your pouch is fuller than this, it will be more difficult to empty and more noticeable. When you empty your pouch, either put toilet paper in the toilet bowl first, or flush the toilet while you empty the pouch. This will reduce splashing. You can empty the pouch between your legs or to one side while sitting, or while standing or stooping. If you have a 2-piece system, you can snap off the pouch to empty it. Remember that your stoma may function during this time. If you wish to rinse your pouch after you empty it, a [place] baster can be helpful. When using a baster, squirt water up into the pouch through the opening at the bottom. With a 2-piece system, you can snap off the pouch to rinse it. After rinsing  your pouch, empty it into the toilet. When rinsing your pouch at home, put a few granules of Dreft soap in the rinse water. This helps lubricate and freshen your pouch. The inside of your pouch can be sprayed with non-stick cooking  oil (Pam spray). This may help reduce stool sticking to the inside of the pouch.  Bathing You may shower or bathe with your pouch on or off. Remember that your stoma may function during this time.  The materials you use to wash your stoma and the skin around it should be  clean, but they do not need to be sterile.  Wearing Your Pouch During hot weather, or if you perspire a lot in general, wear a cover over your pouch. This may prevent a rash on your skin under the pouch. Pouch covers are sold at ostomy supply stores. Wear the pouch inside your underwear for better support. Watch your weight. Any gain or loss of 10 to 15 pounds or more can change the way your pouch fits.  Going Away From Home A collapsible cup (like those that come in travel kits) or a soft plastic squirt bottle with a pull-up top (like a travel bottle for shampoo) can be used for rinsing your pouch when you are away from home. Tilt the opening of the pouch at an upward angle when using a cup to rinse.  Carry wet wipes or extra tissues to use in public bathrooms.  Carry an extra pouching system with you at all times.  Never keep ostomy supplies in the glove compartment of your car. Extreme heat or cold can damage the skin barriers and adhesive wafers on the pouch.  When you travel, carry your ostomy supplies with you at all times. Keep them within easy reach. Do not pack ostomy supplies in baggage that will be checked or otherwise separated from you, because your baggage might be lost. If you're traveling out of the country, it is helpful to have a letter stating that you are carrying ostomy supplies as a medical necessity.  If you need ostomy supplies while traveling, look in the yellow pages of the telephone book under "Surgical Supplies." Or call the local ostomy organization to find out where supplies are available.  Do not let your ostomy supplies get low. Always order new pouches before you use the last one.  Reducing  Odor Limit foods such as broccoli, cabbage, onions, fish, and garlic in your diet to help reduce odor. Each time you empty your pouch, carefully clean the opening of the pouch, both inside and outside, with toilet paper. Rinse your pouch 1 or 2 times daily after you empty it (see directions for emptying your pouch and going away from home). Add deodorant (such as Super Banish or Nullo) to your pouch. Use air deodorizers in your bathroom. Do not add aspirin to your pouch. Even though aspirin can help prevent odor, it could cause ulcers on your stoma.  When to call the doctor Call the doctor if you have any of the following symptoms: Purple, black, or white stoma Severe cramps lasting more than 6 hours Severe watery discharge from the stoma lasting more than 6 hours No output from the colostomy for 3 days Excessive bleeding from your stoma Swelling of your stoma to more than 1/2-inch larger than usual Pulling inward of your stoma below skin level Severe skin irritation or deep ulcers Bulging or other changes in your abdomen  When to call your ostomy nurse Call your ostomy/enterostomal therapy (WOCN) nurse if any of the following occurs: Frequent leaking of your pouching system Change in size or appearance of your stoma, causing discomfort or problems with your pouch Skin rash or rawness Weight gain or loss that causes problems with your pouch     FREQUENTLY ASKED QUESTIONS   Why haven't you met any of these folks who have an ostomy?  Well, maybe you have! You just did not recognize them because an ostomy doesn't show. It can be kept secret if you wish. Why, maybe some of your best friends, office  associates or neighbors have an ostomy ... you never can tell. People facing ostomy surgery have many quality-of-life questions like: Will you bulge? Smell? Make noises? Will you feel waste leaving your body? Will you be a captive of the toilet? Will you starve? Be a social outcast? Get/stay  married? Have babies? Easily bathe, go swimming, bend over?  OK, let's look at what you can expect:   Will you bulge?  Remember, without part of the intestine or bladder, and its contents, you should have a flatter tummy than before. You can expect to wear, with little exception, what you wore before surgery ... and this in-cludes tight clothing and bathing suits.   Will you smell?  Today, thanks to modern odor proof pouching systems, you can walk into an ostomy support group meeting and not smell anything that is foul or offensive. And, for those with an ileostomy or colostomy who are concerned about odor when emptying their pouch, there are in-pouch deodorants that can be used to eliminate any waste odors that may exist.   Will you make noises?  Everyone produces gas, especially if they are an air-swallower. But intestinal sounds that occur from time to time are no differ-ent than a gurgling tummy, and quite often your clothing will muffle any sounds.   Will you feel the waste discharges?  For those with a colostomy or ileostomy there might be a slight pressure when waste leaves your body, but understand that the intestines have no nerve endings, so there will be no unpleasant sensations. Those with a urostomy will probably be unaware of any kidney drainage.   Will you be a captive of the toilet?  Immediately post-op you will spend more time in the bathroom than you will after your body recovers from surgery. Every person is different, but on average those with an ileostomy or urostomy may empty their pouches 4 to 6 times a day; a little  less if you have a colostomy. The average wear time between pouch system changes is 3 to 5 days and the changing process should take less than 30 minutes.   Will I need to be on a special diet? Most people return to their normal diet when they have recovered from surgery. Be sure to chew your food well, eat a well-balanced diet and drink plenty of fluids. If  you experience problems with a certain food, wait a couple of weeks and try it again.  Will there be odor and noises? Pouching systems are designed to be odor-proof or odor-resistant. There are deodorants that can be used in the pouch. Medications are also available to help reduce odor. Limit gas-producing foods and carbonated beverages. You will experience less gas and fewer noises as you heal from surgery.  How much time will it take to care for my ostomy? At first, you may spend a lot of time learning about your ostomy and how to take care of it. As you become more comfortable and skilled at changing the pouching system, it will take very little time to care for it.   Will I be able to return to work? People with ostomies can perform most jobs. As soon as you have healed from surgery, you should be able to return to work. Heavy lifting (more than 10 pounds) may be discouraged.   What about intimacy? Sexual relationships and intimacy are important and fulfilling aspects of your life. They should continue after ostomy surgery. Intimacy-related concerns should be discussed openly between you and  your partner.   Can I wear regular clothing? You do not need to wear special clothing. Ostomy pouches are fairly flat and barely noticeable. Elastic undergarments will not hurt the stoma or prevent the ostomy from functioning.   Can I participate in sports? An ostomy should not limit your involvement in sports. Many people with ostomies are runners, skiers, swimmers or participate in other active lifestyles. Talk with your caregiver first before doing heavy physical activity.  Will you starve?  Not if you follow doctor's orders at each stage of your post-op adjustment. There is no such thing as an "ostomy diet". Some people with an ostomy will be able to eat and tolerate anything; others may find diffi-culty with some foods. Each person is an individual and must determine, by trial, what is best for  them. A good practice for all is to drink plenty of water.   Will you be a social outcast?  Have you met anyone who has an ostomy and is a social outcast? Why should you be the first? Only your attitude and self image will effect how you are treated. No confi-dent person is an Occupational psychologist.    PROFESSIONAL HELP   Resources are available if you need help or have questions about your ostomy.   Specially trained nurses called Wound, Ostomy Continence Nurses (WOCN) are available for consultation in most major medical centers.  Consider getting an ostomy consult at an outpatient ostomy clinic.   [facility] has an [facility] run by an Programmer, systems at the [place].  [phone]. [facility] can help set up an appointment   The [employer] (UOA) is a group made up of many local chapters throughout the [place]. These local groups hold meetings and provide support to prospective and existing ostomates. They sponsor educational events and have qualified visitors to make personal or telephone visits. Contact the UOA for the chapter nearest you and for other educational publications.  More detailed information can be found in Colostomy Guide, a publication of the [employer] (UOA). Contact UOA at 1-(510)808-6370 or visit their web site at [email or web address] The website contains links to other sites, suppliers and resources.  Tree surgeon Start Services: Start at the website to enlist for support.  Your Wound Ostomy (WOCN) nurse may have started this process. https://[email or web address].hollister.[email or web address]/securestart Secure Start services are designed to support people as they live their lives with an ostomy or neurogenic bladder. Enrolling is easy and at no cost to the patient. We realize that each person's needs and life journey are different. Through Secure Start services, we want to help people live their life, their  way.  #######################################################    SURGERY: POST OP INSTRUCTIONS (Surgery for small bowel obstruction, colon resection, etc)   ######################################################################  EAT Gradually transition to a high fiber diet with a fiber supplement over the next few days after discharge  WALK Walk an hour a day.  Control your pain to do that.    CONTROL PAIN Control pain so that you can walk, sleep, tolerate sneezing/coughing, go up/down stairs.  HAVE A BOWEL MOVEMENT DAILY Keep your bowels regular to avoid problems.  OK to try a laxative to override constipation.  OK to use an antidairrheal to slow down diarrhea.  Call if not better after 2 tries  CALL IF YOU HAVE PROBLEMS/CONCERNS Call if you are still struggling despite following these instructions. Call if you have concerns not answered by these instructions  ######################################################################  DIET Follow a light diet the first few days at home.  Start with a bland diet such as soups, liquids, starchy foods, low fat foods, etc.  If you feel full, bloated, or constipated, stay on a ful liquid or pureed/blenderized diet for a few days until you feel better and no longer constipated. Be sure to drink plenty of fluids every day to avoid getting dehydrated (feeling dizzy, not urinating, etc.). Gradually add a fiber supplement to your diet over the next week.  Gradually get back to a regular solid diet.  Avoid fast food or heavy meals the first week as you are more likely to get nauseated. It is expected for your digestive tract to need a few months to get back to normal.  It is common for your bowel movements and stools to be irregular.  You will have occasional bloating and cramping that should eventually fade away.  Until you are eating solid food normally, off all pain medications, and back to regular activities; your bowels will not be  normal. Focus on eating a low-fat, high fiber diet the rest of your life (See Getting to [place], below).  CARE of your INCISION or WOUND It is good for closed incision and even open wounds to be washed every day.  Shower every day.  Short baths are fine.  Wash the incisions and wounds clean with soap & water.     If you have a closed incision(s), wash the incision with soap & water every day.  You may leave closed incisions open to air if it is dry.   You may cover the incision with clean gauze & replace it after your daily shower for comfort.  It is good for closed incisions and even open wounds to be washed every day.  Shower every day.  Short baths are fine.  Wash the incisions and wounds clean with soap & water.    You may leave closed incisions open to air if it is dry.   You may cover the incision with clean gauze & replace it after your daily shower for comfort.  DRAIN:  You have a drain in place.  Every day change the dressing in the shower, wash around the skin exit site with soap & water and place a new dressing of gauze or band aid around the skin every day.  Keep the drain site clean & dry.  Follow up in our surgery office to discuss plans for drain removal in the near future   If you have an open wound with a wound vac, see wound vac care instructions.     ACTIVITIES as tolerated Start light daily activities --- self-care, walking, climbing stairs-- beginning the day after surgery.  Gradually increase activities as tolerated.  Control your pain to be active.  Stop when you are tired.  Ideally, walk several times a day, eventually an hour a day.   Most people are back to most day-to-day activities in a few weeks.  It takes 4-8 weeks to get back to unrestricted, intense activity. If you can walk 30 minutes without difficulty, it is safe to try more intense activity such as jogging, treadmill, bicycling, low-impact aerobics, swimming, etc. Save the most intensive and  strenuous activity for last (Usually 4-8 weeks after surgery) such as sit-ups, heavy lifting, contact sports, etc.  Refrain from any intense heavy lifting or straining until you are off narcotics for pain control.  You will have off days, but things should improve  week-by-week. DO NOT PUSH THROUGH PAIN.  Let pain be your guide: If it hurts to do something, don't do it.  Pain is your body warning you to avoid that activity for another week until the pain goes down. You may drive when you are no longer taking narcotic prescription pain medication, you can comfortably wear a seatbelt, and you can safely make sudden turns/stops to protect yourself without hesitating due to pain. You may have sexual intercourse when it is comfortable. If it hurts to do something, stop.  MEDICATIONS Take your usually prescribed home medications unless otherwise directed.   Blood thinners:  Usually you can restart any strong blood thinners after the second postoperative day.  It is OK to take aspirin right away.     If you are on strong blood thinners (warfarin/Coumadin, Plavix, Xerelto, Eliquis, Pradaxa, etc), discuss with your surgeon, medicine PCP, and/or cardiologist for instructions on when to restart the blood thinner & if blood monitoring is needed (PT/INR blood check, etc).     PAIN CONTROL Pain after surgery or related to activity is often due to strain/injury to muscle, tendon, nerves and/or incisions.  This pain is usually short-term and will improve in a few months.  To help speed the process of healing and to get back to regular activity more quickly, DO THE FOLLOWING THINGS TOGETHER: Increase activity gradually.  DO NOT PUSH THROUGH PAIN Use Ice and/or Heat Try Gentle Massage and/or Stretching Take over the counter pain medication Take Narcotic prescription pain medication for more severe pain  Good pain control = faster recovery.  It is better to take more medicine to be more active than to stay in bed  all day to avoid medications.  Increase activity gradually Avoid heavy lifting at first, then increase to lifting as tolerated over the next 6 weeks. Do not "push through" the pain.  Listen to your body and avoid positions and maneuvers than reproduce the pain.  Wait a few days before trying something more intense Walking an hour a day is encouraged to help your body recover faster and more safely.  Start slowly and stop when getting sore.  If you can walk 30 minutes without stopping or pain, you can try more intense activity (running, jogging, aerobics, cycling, swimming, treadmill, sex, sports, weightlifting, etc.) Remember: If it hurts to do it, then don't do it! Use Ice and/or Heat You will have swelling and bruising around the incisions.  This will take several weeks to resolve. Ice packs or heating pads (6-8 times a day, 30-60 minutes at a time) will help sooth soreness & bruising. Some people prefer to use ice alone, heat alone, or alternate between ice & heat.  Experiment and see what works best for you.  Consider trying ice for the first few days to help decrease swelling and bruising; then, switch to heat to help relax sore spots and speed recovery. Shower every day.  Short baths are fine.  It feels good!  Keep the incisions and wounds clean with soap & water.   Try Gentle Massage and/or Stretching Massage at the area of pain many times a day Stop if you feel pain - do not overdo it Take over the counter pain medication This helps the muscle and nerve tissues become less irritable and calm down faster Choose ONE of the following over-the-counter anti-inflammatory medications: Acetaminophen 576m tabs (Tylenol) 1-2 pills with every meal and just before bedtime (avoid if you have liver problems or if you have acetaminophen  in you narcotic prescription) Naproxen 269m tabs (ex. Aleve, Naprosyn) 1-2 pills twice a day (avoid if you have kidney, stomach, IBD, or bleeding problems) Ibuprofen  204mtabs (ex. Advil, Motrin) 3-4 pills with every meal and just before bedtime (avoid if you have kidney, stomach, IBD, or bleeding problems) Take with food/snack several times a day as directed for at least 2 weeks to help keep pain / soreness down & more manageable. Take Narcotic prescription pain medication for more severe pain A prescription for strong pain control is often given to you upon discharge (for example: oxycodone/Percocet, hydrocodone/Norco/Vicodin, or tramadol/Ultram) Take your pain medication as prescribed. Be mindful that most narcotic prescriptions contain Tylenol (acetaminophen) as well - avoid taking too much Tylenol. If you are having problems/concerns with the prescription medicine (does not control pain, nausea, vomiting, rash, itching, etc.), please call [phone] see if we need to switch you to a different pain medicine that will work better for you and/or control your side effects better. If you need a refill on your pain medication, you must call the office before 4 pm and on weekdays only.  By federal law, prescriptions for narcotics cannot be called into a pharmacy.  They must be filled out on paper & picked up from our office by the patient or authorized caretaker.  Prescriptions cannot be filled after 4 pm nor on weekends.    WHEN TO CALL [phone] uncontrolled or worsening pain  Fever over 101 F (38.5 C) Concerns with the incision: Worsening pain, redness, rash/hives, swelling, bleeding, or drainage Reactions / problems with new medications (itching, rash, hives, nausea, etc.) Nausea and/or vomiting Difficulty urinating Difficulty breathing Worsening fatigue, dizziness, lightheadedness, blurred vision Other concerns If you are not getting better after two weeks or are noticing you are getting worse, contact our office [phone] for further advice.  We may need to adjust your medications, re-evaluate you in the office, send you to  the emergency room, or see what other things we can do to help. The clinic staff is available to answer your questions during regular business hours (8:30am-5pm).  Please don't hesitate to call and ask to speak to one of our nurses for clinical concerns.    A surgeon from [facility] is always on call at the hospitals 24 hours/day If you have a medical emergency, go to the nearest emergency room or call 911.  FOLLOW UP in our office One the day of your discharge from the hospital (or the next business weekday), please call [facility] to set up or confirm an appointment to see your surgeon in the office for a follow-up appointment.  Usually it is 2-3 weeks after your surgery.   If you have skin staples at your incision(s), let the office know so we can set up a time in the office for the nurse to remove them (usually around 10 days after surgery). Make sure that you call for appointments the day of discharge (or the next business weekday) from the hospital to ensure a convenient appointment time. IF YOU HAVE DISABILITY OR FAMILY LEAVE FORMS, BRING THEM TO THE OFFICE FOR PROCESSING.  DO NOT GIVE THEM TO YOUR DOCTOR.  [facility] [address]  [id number] [phone] Main [phone] [place] ([phone] Fax [email or web address]  GETTING TO GOOD BOWEL HEALTH. It is expected for your digestive tract to need a few  months to get back to normal.  It is common for your bowel movements and stools to be irregular.  You will have occasional bloating and cramping that should eventually fade away.  Until you are eating solid food normally, off all pain medications, and back to regular activities; your bowels will not be normal.   Avoiding constipation The goal: ONE SOFT BOWEL MOVEMENT A DAY!    Drink plenty of fluids.  Choose water first. TAKE A FIBER SUPPLEMENT EVERY DAY THE REST OF YOUR LIFE During  your first week back home, gradually add back a fiber supplement every day Experiment which form you can tolerate.   There are many forms such as powders, tablets, wafers, gummies, etc Psyllium bran (Metamucil), methylcellulose (Citrucel), Miralax or Glycolax, Benefiber, Flax Seed.  Adjust the dose week-by-week (1/2 dose/day to 6 doses a day) until you are moving your bowels 1-2 times a day.  Cut back the dose or try a different fiber product if it is giving you problems such as diarrhea or bloating. Sometimes a laxative is needed to help jump-start bowels if constipated until the fiber supplement can help regulate your bowels.  If you are tolerating eating & you are farting, it is okay to try a gentle laxative such as double dose MiraLax, prune juice, or Milk of Magnesia.  Avoid using laxatives too often. Stool softeners can sometimes help counteract the constipating effects of narcotic pain medicines.  It can also cause diarrhea, so avoid using for too long. If you are still constipated despite taking fiber daily, eating solids, and a few doses of laxatives, call our office. Controlling diarrhea Try drinking liquids and eating bland foods for a few days to avoid stressing your intestines further. Avoid dairy products (especially milk & ice cream) for a short time.  The intestines often can lose the ability to digest lactose when stressed. Avoid foods that cause gassiness or bloating.  Typical foods include beans and other legumes, cabbage, broccoli, and dairy foods.  Avoid greasy, spicy, fast foods.  Every person has some sensitivity to other foods, so listen to your body and avoid those foods that trigger problems for you. Probiotics (such as active yogurt, Align, etc) may help repopulate the intestines and colon with normal bacteria and calm down a sensitive digestive tract Adding a fiber supplement gradually can help thicken stools by absorbing excess fluid and retrain the intestines to act more  normally.  Slowly increase the dose over a few weeks.  Too much fiber too soon can backfire and cause cramping & bloating. It is okay to try and slow down diarrhea with a few doses of antidiarrheal medicines.   Bismuth subsalicylate (ex. Kayopectate, Pepto Bismol) for a few doses can help control diarrhea.  Avoid if pregnant.   Loperamide (Imodium) can slow down diarrhea.  Start with one tablet (8m) first.  Avoid if you are having fevers or severe pain.  ILEOSTOMY PATIENTS WILL HAVE CHRONIC DIARRHEA since their colon is not in use.    Drink plenty of liquids.  You will need to drink even more glasses of water/liquid a day to avoid getting dehydrated. Record output from your ileostomy.  Expect to empty the bag every 3-4 hours at first.  Most people with a permanent ileostomy empty their bag 4-6 times at the least.   Use antidiarrheal medicine (especially Imodium) several times a day to avoid getting dehydrated.  Start with a dose at bedtime & breakfast.  Adjust up or down as needed.  Increase antidiarrheal medications as directed to avoid emptying the bag more than 8 times a day (every 3 hours). Work with your wound ostomy nurse to learn care for your ostomy.  See ostomy care instructions. TROUBLESHOOTING IRREGULAR BOWELS 1) Start with a soft & bland diet. No spicy, greasy, or fried foods.  2) Avoid gluten/wheat or dairy products from diet to see if symptoms improve. 3) Miralax 17gm or flax seed mixed in [place]. water or juice-daily. May use 2-4 times a day as needed. 4) Gas-X, Phazyme, etc. as needed for gas & bloating.  5) Prilosec (omeprazole) over-the-counter as needed 6)  Consider probiotics (Align, Activa, etc) to help calm the bowels down  Call your doctor if you are getting worse or not getting better.  Sometimes further testing (cultures, endoscopy, X-ray studies, CT scans, bloodwork, etc.) may be needed to help diagnose and treat the cause of the diarrhea. [facility], [place], [place], [place], [place]  [address] [phone] - Main.    [phone]  - Toll Free.   [phone] - Fax [email or web address]    ###########################################################   EATING AFTER A DIVERTICULITIS ATTACK   EAT START WITH PUREED OR SOFT FOODS Gradually transition to a high fiber diet with a fiber supplement over the next few days after discharge  WALK Walk an hour a day.  Control your pain to do that.    CONTROL PAIN Control pain so that you can walk, sleep, tolerate sneezing/coughing, go up/down stairs.  HAVE A BOWEL MOVEMENT DAILY Keep your bowels regular to avoid problems.  OK to try a laxative to override constipation.  OK to use an antidairrheal to slow down diarrhea.  Call if not better after 2 tries  CALL IF YOU HAVE PROBLEMS/CONCERNS Call if you are still struggling despite following these instructions. Call if you have concerns not answered by these instructions     After your attack of DIVERTICULITIS, expect some issues over the next few weeks.    To help you through this temporary phase, we start you out on a pureed (blenderized) diet.  Your first meal in the hospital was thin liquids.  You should have been given a pureed diet by the time you left the hospital.  We ask patients to stay on a pureed diet for the first few days to avoid anything getting "stuck."  Don't be alarmed if your ability to swallow doesn't progress according to this plan.  Everyone is different and some diets can advance more or less quickly.     Some BASIC RULES to follow are: Maintain an upright position whenever eating or drinking. Take small bites - just a teaspoon size bite at a time. Eat slowly.  It may also help to eat only one food at a time. Consider nibbling through smaller, more frequent meals & avoid the urge to eat BIG meals Do not push through feelings of fullness, nausea, or bloatedness Do not mix solid foods and  liquids in the same mouthful Try not to "wash foods down" with large gulps of liquids. Avoid carbonated (bubbly/fizzy) drinks.   Avoid foods that make you feel gassy or bloated.  Start with bland foods first.  Wait on trying greasy, fried, or spicy meals until you are tolerating more bland solids well. Expect to be more gassy/flatulent/bloated initially.  Walking will help your body manage it better. Consider using medications for bloating that contain simethicone such as  Maalox or Gas-X  Eat in a relaxed  atmosphere & minimize distractions. Avoid talking while eating.   Do not use straws. Following each meal, sit in an upright position (90 degree angle) for 60 to 90 minutes.  Going for a short walk can help as well If food does stick, don't panic.  Try to relax and let the food pass on its own.  Sipping WARM LIQUID such as strong hot black tea can also help slide it down.   Be gradual in changes & use common sense:  -If you easily tolerating a certain "level" of foods, advance to the next level gradually -If you are having trouble swallowing a particular food, then avoid it.   -If food is sticking when you advance your diet, go back to thinner previous diet (the lower LEVEL) for 1-2 days.  LEVEL 1 = PUREED DIET  Do for the first [place] in this group are pureed or blenderized to a smooth, mashed potato-like consistency.  -If necessary, the pureed foods can keep their shape with the addition of a thickening agent.   -Meat should be pureed to a smooth, pasty consistency.  Hot broth or gravy may be added to the pureed meat, approximately 1 oz. of liquid per 3 oz. serving of meat. -CAUTION:  If any foods do not puree into a smooth consistency, swallowing will be more difficult.  (For example, nuts or seeds sometimes do not blend well.)  Hot Foods Cold Foods  Pureed scrambled eggs and cheese Pureed cottage cheese  Baby cereals Thickened juices and nectars   Thinned cooked cereals (no lumps) Thickened milk or eggnog  Pureed [place] toast or pancakes Ensure  Mashed potatoes Ice cream  Pureed parsley, au gratin, scalloped potatoes, candied sweet potatoes Fruit or [place] ice, sherbet  Pureed buttered or alfredo noodles Plain yogurt  Pureed vegetables (no corn or peas) Instant breakfast  Pureed soups and creamed soups Smooth pudding, mousse, custard  Pureed scalloped apples Whipped gelatin  Gravies Sugar, syrup, honey, jelly  Sauces, cheese, tomato, barbecue, white, creamed Cream  Any baby food Creamer  Alcohol in moderation (not beer or champagne) Margarine  Coffee or tea Mayonnaise   Ketchup, mustard   Apple sauce   SAMPLE MENU:  PUREED DIET Breakfast Lunch Dinner  Orange juice, 1/2 cup Cream of wheat, 1/2 cup Pineapple juice, 1/2 cup Pureed [place], barley soup, 3/4 cup Pureed Hawaiian chicken, 3 oz  Scrambled eggs, mashed or blended with cheese, 1/2 cup Tea or coffee, 1 cup  Whole milk, 1 cup  Non-dairy creamer, 2 Tbsp. Mashed potatoes, 1/2 cup Pureed cooled broccoli, 1/2 cup Apple sauce, 1/2 cup Coffee or tea Mashed potatoes, 1/2 cup Pureed spinach, 1/2 cup Frozen yogurt, 1/2 cup Tea or coffee      LEVEL 2 = SOFT DIET  After your first few days, you can advance to a soft, low residue diet.   Keep on this diet until everything goes down easily.  Hot Foods Cold Foods  White fish Cottage cheese  Stuffed fish Junior baby fruit  Baby food meals Semi thickened juices  Minced soft cooked, scrambled, poached eggs nectars  Souffle & omelets Ripe mashed bananas  Cooked cereals Canned fruit, pineapple sauce, milk  potatoes Milkshake  Buttered or Alfredo noodles Custard  Cooked cooled vegetable Puddings, including tapioca  Sherbet Yogurt  Vegetable soup or alphabet soup Fruit ice, [place] ice  Gravies Whipped gelatin  Sugar, syrup, honey, jelly Junior baby desserts  Sauces:  Cheese, creamed, barbecue, tomato,  white Cream   Coffee or tea Margarine   SAMPLE MENU:  LEVEL 2 Breakfast Lunch Dinner  Orange juice, 1/2 cup Oatmeal, 1/2 cup Scrambled eggs with cheese, 1/2 cup Decaffeinated tea, 1 cup Whole milk, 1 cup Non-dairy creamer, 2 Tbsp Pineapple juice, 1/2 cup Minced beef, 3 oz Gravy, 2 Tbsp Mashed potatoes, 1/2 cup Minced fresh broccoli, 1/2 cup Applesauce, 1/2 cup Coffee, 1 cup [place], barley soup, 3/4 cup Minced Hawaiian chicken, 3 oz Mashed potatoes, 1/2 cup Cooked spinach, 1/2 cup Frozen yogurt, 1/2 cup Non-dairy creamer, 2 Tbsp      LEVEL 3 = CHOPPED DIET  -After all the foods in level 2 (soft diet) are passing through well you should advance up to more chopped foods.  -It is still important to cut these foods into small pieces and eat slowly.  Hot Foods Cold Foods  Poultry Cottage cheese  Chopped Swedish meatballs Yogurt  Meat salads (ground or flaked meat) Milk  Flaked fish (tuna) Milkshakes  Poached or scrambled eggs Soft, cold, dry cereal  Souffles and omelets Fruit juices or nectars  Cooked cereals Chopped canned fruit  Chopped [place] toast or pancakes Canned fruit cocktail  Noodles or pasta (no rice) Pudding, mousse, custard  Cooked vegetables (no frozen peas, corn, or mixed vegetables) Green salad  Canned small sweet peas Ice cream  Creamed soup or vegetable soup Fruit ice, [place] ice  Pureed vegetable soup or alphabet soup Non-dairy creamer  Ground scalloped apples Margarine  Gravies Mayonnaise  Sauces:  Cheese, creamed, barbecue, tomato, white Ketchup  Coffee or tea Mustard   SAMPLE MENU:  LEVEL 3 Breakfast Lunch Dinner  Orange juice, 1/2 cup Oatmeal, 1/2 cup Scrambled eggs with cheese, 1/2 cup Decaffeinated tea, 1 cup Whole milk, 1 cup Non-dairy creamer, 2 Tbsp Ketchup, 1 Tbsp Margarine, 1 tsp Salt, 1/4 tsp Sugar, 2 tsp Pineapple juice, 1/2 cup Ground beef, 3 oz Gravy, 2 Tbsp Mashed potatoes, 1/2 cup Cooked spinach, 1/2 cup Applesauce, 1/2  cup Decaffeinated coffee Whole milk Non-dairy creamer, 2 Tbsp Margarine, 1 tsp Salt, 1/4 tsp Pureed [place], barley soup, 3/4 cup Barbecue chicken, 3 oz Mashed potatoes, 1/2 cup Ground fresh broccoli, 1/2 cup Frozen yogurt, 1/2 cup Decaffeinated tea, 1 cup Non-dairy creamer, 2 Tbsp Margarine, 1 tsp Salt, 1/4 tsp Sugar, 1 tsp    LEVEL 4:  HIGH FIBER DIET / REGULAR FOODS  -Foods in this group are soft, moist, regularly textured foods.   -This level includes meat and breads, which tend to be the hardest things to swallow.   -Eat very slowly, chew well and continue to avoid carbonated drinks. -most people are at this level in 2-4 weeks  Hot Foods Cold Foods  Baked fish or skinned Soft cheeses - cottage cheese  Souffles and omelets Cream cheese  Eggs Yogurt  Stuffed shells Milk  Spaghetti with meat sauce Milkshakes  Cooked cereal Cold dry cereals (no nuts, dried fruit, coconut)  [place] toast or pancakes Crackers  Buttered toast Fruit juices or nectars  Noodles or pasta (no rice) Canned fruit  Potatoes (all types) Ripe bananas  Soft, cooked vegetables (no corn, lima, or baked beans) Peeled, ripe, fresh fruit  Creamed soups or vegetable soup Cakes (no nuts, dried fruit, coconut)  Canned chicken noodle soup Plain doughnuts  Gravies Ice cream  Bacon dressing Pudding, mousse, custard  Sauces:  Cheese, creamed, barbecue, tomato, white Fruit ice, [place] ice, sherbet  Decaffeinated tea or coffee Whipped gelatin  Pork chops Regular gelatin  Canned fruited gelatin molds   Sugar, syrup, honey, jam, jelly   Cream   Non-dairy   Margarine   Oil   Mayonnaise   Ketchup   Mustard   TROUBLESHOOTING IRREGULAR BOWELS  1) Avoid extremes of bowel movements (no bad constipation/diarrhea)  2) Miralax 17gm mixed in 8oz. water or juice-daily. May use BID as needed.  3) Gas-x,Phazyme, etc. as needed for gas & bloating.  4) Soft,bland diet. No spicy,greasy,fried foods.  5) Prilosec  over-the-counter as needed  6) May hold gluten/wheat products from diet to see if symptoms improve.  7) May try probiotics (Align, Activa, etc) to help calm the bowels down  7) If symptoms become worse call back immediately.    If you have any questions please call our office at [place]: [phone].     This information is not intended to replace advice given to you by your health care provider. Make sure you discuss any questions you have with your health care provider.  Diverticulitis  Diverticulitis is infection or inflammation of small pouches (diverticula) in the colon that form due to a condition called diverticulosis. Diverticula can trap stool (feces) and bacteria, causing infection and inflammation. Diverticulitis may cause severe stomach pain and diarrhea. It may lead to tissue damage in the colon that causes bleeding. The diverticula may also burst (rupture) and cause infected stool to enter other areas of the abdomen. Complications of diverticulitis can include: Bleeding. Severe infection. Severe pain. Rupture (perforation) of the colon. Blockage (obstruction) of the colon. What are the causes? This condition is caused by stool becoming trapped in the diverticula, which allows bacteria to grow in the diverticula. This leads to inflammation and infection. What increases the risk? You are more likely to develop this condition if: You have diverticulosis. The risk for diverticulosis increases if: You are overweight or obese. You use tobacco products. You do not get enough exercise. You eat a diet that does not include enough fiber. High-fiber foods include fruits, vegetables, beans, nuts, and whole grains. What are the signs or symptoms? Symptoms of this condition may include: Pain and tenderness in the abdomen. The pain is normally located on the left side of the abdomen, but it may occur in other areas. Fever and  chills. Bloating. Cramping. Nausea. Vomiting. Changes in bowel routines. Blood in your stool. How is this diagnosed? This condition is diagnosed based on: Your medical history. A physical exam. Tests to make sure there is nothing else causing your condition. These tests may include: Blood tests. Urine tests. Imaging tests of the abdomen, including X-rays, ultrasounds, MRIs, or CT scans. How is this treated? Most cases of this condition are mild and can be treated at home. Treatment may include: Taking over-the-counter pain medicines. Following a clear liquid diet. Taking antibiotic medicines by mouth. Rest. More severe cases may need to be treated at a hospital. Treatment may include: Not eating or drinking. Taking prescription pain medicine. Receiving antibiotic medicines through an IV tube. Receiving fluids and nutrition through an IV tube. Surgery. When your condition is under control, your health care provider may recommend that you have a colonoscopy. This is an exam to look at the entire large intestine. During the exam, a lubricated, bendable tube is inserted into the anus and then passed into the rectum, colon, and other parts of the large intestine. A colonoscopy can show how severe your diverticula are and whether something else may be causing your symptoms. Follow these instructions at home: Medicines  Take over-the-counter and prescription medicines only as told by your health care provider. These include fiber supplements, probiotics, and stool softeners. If you were prescribed an antibiotic medicine, take it as told by your health care provider. Do not stop taking the antibiotic even if you start to feel better. Do not drive or use heavy machinery while taking prescription pain medicine. General instructions  Follow a full liquid diet or another diet as directed by your health care provider. After your symptoms improve, your health care provider may tell you to change  your diet. He or she may recommend that you eat a diet that contains at least 25 g (25 grams) of fiber daily. Fiber makes it easier to pass stool. Healthy sources of fiber include: Berries. One cup contains 4-8 grams of fiber. Beans or lentils. One half cup contains 5-8 grams of fiber. Green vegetables. One cup contains 4 grams of fiber. Exercise for at least 30 minutes, 3 times each week. You should exercise hard enough to raise your heart rate and break a sweat. Keep all follow-up visits as told by your health care provider. This is important. You may need a colonoscopy. Contact a health care provider if: Your pain does not improve. You have a hard time drinking or eating food. Your bowel movements do not return to normal. Get help right away if: Your pain gets worse. Your symptoms do not get better with treatment. Your symptoms suddenly get worse. You have a fever. You vomit more than one time. You have stools that are bloody, black, or tarry. Summary Diverticulitis is infection or inflammation of small pouches (diverticula) in the colon that form due to a condition called diverticulosis. Diverticula can trap stool (feces) and bacteria, causing infection and inflammation. You are at higher risk for this condition if you have diverticulosis and you eat a diet that does not include enough fiber. Most cases of this condition are mild and can be treated at home. More severe cases may need to be treated at a hospital. When your condition is under control, your health care provider may recommend that you have an exam called a colonoscopy. This exam can show how severe your diverticula are and whether something else may be causing your symptoms. This information is not intended to replace advice given to you by your health care provider. Make sure you discuss any questions you have with your health care provider. Document Revised: 09/11/2017 Document Reviewed: 11/01/2016 Elsevier Patient  Education  2020 [employer].

## 2021-09-17 NOTE — Anesthesia Postprocedure Evaluation (Signed)
Anesthesia Post Note  Patient: Lori Mcknight  Procedure(s) Performed: XI ROBOT ASSISTED SIGMOID COLECTOMY WITH END COLOSTOMY AND BILATERAL TAP BLOCK, LYSIS OF ADHESIONS, DRAINAGE OF ABSCESSES X3 (Abdomen) CYSTOSCOPY with FIREFLY INJECTION (Bladder)     Patient location during evaluation: PACU Anesthesia Type: General Level of consciousness: awake and alert Pain management: pain level controlled Vital Signs Assessment: post-procedure vital signs reviewed and stable Respiratory status: spontaneous breathing, nonlabored ventilation and respiratory function stable Cardiovascular status: blood pressure returned to baseline and stable Postop Assessment: no apparent nausea or vomiting Anesthetic complications: no   No notable events documented.  Last Vitals:  Vitals:   09/17/21 2055 09/17/21 2117  BP: (!) (P) 129/109 (!) 146/96  Pulse:  97  Resp:  (!) 23  Temp:  36.8 C  SpO2:  97%            Lucretia Kern

## 2021-09-17 NOTE — Progress Notes (Addendum)
Day of Surgery  Subjective: CC: Patient reports stable abdominal pain that is mainly in the left lower quadrant.  She rates this as a 5-6/10 for pain medication a 3-4/10 after pain medications.  Last episode of flatus was 2 days ago.  She did drink MiraLAX bowel prep yesterday and had ~5 liquid bm's yesterday with the last at 10pm. No n/v.  Denies any pneumaturia or flecks of stool in her urine. Voiding without difficulty.   Objective: Vital signs in last 24 hours: Temp:  [97.7 F (36.5 C)-98.6 F (37 C)] 98 F (36.7 C) (12/06 0353) Pulse Rate:  [68-82] 82 (12/06 0353) Resp:  [18-20] 20 (12/06 0353) BP: (126-136)/(75-97) 126/76 (12/06 0353) SpO2:  [97 %-98 %] 97 % (12/06 0353) Last BM Date: 09/16/21  Intake/Output from previous day: 12/05 0701 - 12/06 0700 In: 1131.1 [P.O.:240; I.V.:546.9; IV Piggyback:344.2] Out: -  Intake/Output this shift: No intake/output data recorded.  PE: Gen:  Alert, NAD, pleasant Card:  RRR Pulm:  CTAB, no W/R/R, effort normal Abd: Obese, soft, ND, LLQ tenderness without peritonitis, +BS, ostomy markings present in right and left mid abdomen.  Psych: A&Ox3  Skin: no rashes noted, warm and dry  Lab Results:  Recent Labs    09/15/21 0533 09/17/21 0522  WBC 10.9* 8.6  HGB 14.6 13.6  HCT 44.0 41.4  PLT 197 193   BMET Recent Labs    09/16/21 1256 09/17/21 0522  NA 136 136  K 3.9 3.6  CL 102 105  CO2 25 24  GLUCOSE 106* 91  BUN <5* <5*  CREATININE 0.77 0.60  CALCIUM 8.7* 8.9   PT/INR No results for input(s): LABPROT, INR in the last 72 hours. CMP     Component Value Date/Time   NA 136 09/17/2021 0522   NA 141 12/09/2016 1113   K 3.6 09/17/2021 0522   CL 105 09/17/2021 0522   CO2 24 09/17/2021 0522   GLUCOSE 91 09/17/2021 0522   BUN <5 (L) 09/17/2021 0522   BUN 10 12/09/2016 1113   CREATININE 0.60 09/17/2021 0522   CREATININE 0.84 10/20/2016 1205   CALCIUM 8.9 09/17/2021 0522   PROT 7.7 09/11/2021 0400   PROT 7.8  11/12/2016 1042   ALBUMIN 4.1 09/11/2021 0400   ALBUMIN 4.0 11/12/2016 1042   AST 16 09/11/2021 0400   ALT 17 09/11/2021 0400   ALKPHOS 64 09/11/2021 0400   BILITOT 0.7 09/11/2021 0400   BILITOT 0.6 11/12/2016 1042   GFRNONAA >60 09/17/2021 0522   GFRNONAA >89 10/20/2016 1205   GFRAA >60 03/31/2020 0543   GFRAA >89 10/20/2016 1205   Lipase     Component Value Date/Time   LIPASE 42 09/11/2021 0400    Studies/Results: No results found.  Anti-infectives: Anti-infectives (From admission, onward)    Start     Dose/Rate Route Frequency Ordered Stop   09/17/21 1400  anidulafungin (ERAXIS) 100 mg in sodium chloride 0.9 % 100 mL IVPB        100 mg 78 mL/hr over 100 Minutes Intravenous Every 24 hours 09/16/21 1352 09/21/21 1359   09/17/21 0600  cefoTEtan (CEFOTAN) 2 g in sodium chloride 0.9 % 100 mL IVPB        2 g 200 mL/hr over 30 Minutes Intravenous On call to O.R. 09/16/21 0835 09/18/21 0559   09/16/21 1500  anidulafungin (ERAXIS) 200 mg in sodium chloride 0.9 % 200 mL IVPB        200 mg 78 mL/hr over 200 Minutes  Intravenous  Once 09/16/21 1400 09/16/21 2338   09/16/21 1400  neomycin (MYCIFRADIN) tablet 1,000 mg  Status:  Discontinued       See Hyperspace for full Linked Orders Report.   1,000 mg Oral 3 times per day 09/16/21 0835 09/16/21 1107   09/16/21 1400  metroNIDAZOLE (FLAGYL) tablet 1,000 mg       See Hyperspace for full Linked Orders Report.   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 Michaell Cowing for robotic assisted sigmoid colectomy.  We are also seeing if urology can do cystoscopy with firefly injection.  ERAS protocol   FEN - NPO, IVF VTE - SCDs, Pre-Op Lovenox ID - Eraxis/Zosyn currently.     LOS: 6 days    Jacinto Halim , [facility] 09/17/2021, 9:13 AM Please see Amion for pager number during day hours 7:00am-4:30pm

## 2021-09-17 NOTE — Op Note (Signed)
Preoperative diagnosis:  1. Intra-operative identification of ureters 2. Diverticulitis of large intestine with abscess  Postoperative diagnosis: 1. same  Procedure(s): 1. Cystoscopy with B ureteral catheterization and firefly injection  Surgeon: Dr. Irine Seal  Anesthesia: gen  Complications: none  EBL: 1  Specimens: none  Disposition of specimens: n/a  Intraoperative findings: normal appearing bladder  Description of procedure:  With appropriate consent having been obtained, the patient was brought to the operative suite. Anesthesia was induced and the patient was prepped and draped in the usual sterile fashion. A timeout was performed  Thereafter the rigid cystoscope was carefully inserted into the bladder. The bladder was inspected thoroughly and no abnormalities were identified.   The right ureteral orifice was identified and cannulated with a 6 fr open ended ureteral catheter. It was easily advanced to the level of the collecting system. The ureteral catheter was then slowly withdrawn while simultaneously injecting the firefly solution over the length of the ureter. Approximately 8 cc total were injected.  The procedure was then repeated in an identical fashion on the left.   A 16 fr foley was then placed in a sterile fashion with immediate return of clear blue/green urine, and the balloon was inflated with 10 cc sterile water.   Irine Seal MD 09/17/2021, 3:38 PM  [facility]  Pager: [phone]

## 2021-09-17 NOTE — Interval H&P Note (Signed)
History and Physical Interval Note:  09/17/2021 1:30 PM  Lori Mcknight  has presented today for surgery, with the diagnosis of colovesical fistula.  The various methods of treatment have been discussed with the patient and family. After consideration of risks, benefits and other options for treatment, the patient has consented to  Procedure(s): XI ROBOT ASSISTED SIGMOID COLECTOMY (N/A) CYSTOSCOPY with FIREFLY INJECTION (N/A) as a surgical intervention.  The patient's history has been reviewed, patient examined, no change in status, stable for surgery.  I have reviewed the patient's chart and labs.  Questions were answered to the patient's satisfaction.    I have re-reviewed the the patient's records, history, medications, and allergies.  I have re-examined the patient.  I again discussed intraoperative plans and goals of post-operative recovery.  The patient agrees to proceed.  Lori Mcknight  04/04/1986 [id number]  Patient Care Team: Claiborne Rigg, NP as PCP - General (Nurse Practitioner) Allie Bossier, MD (Inactive) as Consulting Physician (Obstetrics and Gynecology) Karie Soda, MD as Consulting Physician (Colon and Rectal Surgery) Ccs, Md, MD as Consulting Physician (General Surgery)  Patient Active Problem List   Diagnosis Date Noted   Diverticulitis of large intestine with abscess 05/09/2021    Priority: High   Obesity, morbid, BMI 50 or higher (HCC) 08/08/2019    Priority: Medium    Family history of colon cancer in father 08/08/2019    Priority: Medium    Generalized abdominal pain 05/09/2021   Hypokalemia 05/09/2021   Asthma, mild intermittent 05/09/2021   Endometriosis s/p ablation 08/08/2019   Dysuria 08/08/2019   Acute diverticulitis 08/07/2019   Incomplete bladder emptying 02/18/2018   DUB (dysfunctional uterine bleeding) 02/18/2018   Anemia 02/18/2018   Hypothyroid 01/29/2017   Migraines 08/26/2016   Chest pain 02/04/2016   Abnormal uterine bleeding (AUB)  01/09/2014   Thickened endometrium 12/02/2013   Essential hypertension 10/21/2013   PCOS (polycystic ovarian syndrome) 11/06/2011   Infertility, female 11/06/2011   Pelvic pain in female 08/01/2011    Past Medical History:  Diagnosis Date   Anemia    history of anemia   Anxiety    Asthma    inhaler as needed   Chest pain of uncertain etiology    intermittent left side chest pain   Chlamydia    Depression    Diverticulitis 2019   Endometriosis    Genital herpes    Headache    MIGRAINES   Hypertension    no meds since April   Hypothyroidism    Obese    Obesity, Class III, BMI 40-49.9 (morbid obesity) (HCC) 11/06/2011   PCOS (polycystic ovarian syndrome)    PONV (postoperative nausea and vomiting)    Shortness of breath    on exertion from weight    Past Surgical History:  Procedure Laterality Date   DILATION AND CURETTAGE OF UTERUS N/A 04/29/2018   Procedure: DILATATION AND CURETTAGE;  Surgeon: Allie Bossier, MD;  Location: [facility];  Service: Gynecology;  Laterality: N/A;   HYSTEROSCOPY WITH D & C N/A 04/06/2014   Procedure: DILATATION AND CURETTAGE /HYSTEROSCOPY ;  Surgeon: Tereso Newcomer, MD;  Location: [facility];  Service: Gynecology;  Laterality: N/A;   LAPAROSCOPY N/A 04/29/2018   Procedure: LAPAROSCOPY DIAGNOSTIC WITH PERITONEAL BIOPSY;  Surgeon: Allie Bossier, MD;  Location: [facility];  Service: Gynecology;  Laterality: N/A;   LEFT HEART CATH AND CORONARY ANGIOGRAPHY N/A 12/12/2016   Procedure: Left Heart Cath and Coronary Angiography;  Surgeon: Theron Arista  M [place], MD;  Location: [facility];  Service: Cardiovascular;  Laterality: N/A;   WISDOM TOOTH EXTRACTION      Social History   Socioeconomic History   Marital status: Single    Spouse name: Not on file   Number of children: Not on file   Years of education: Not on file   Highest education level: Not on file  Occupational History   Not on file  Tobacco Use   Smoking status: Some Days    Packs/day: 0.25     Types: Cigarettes   Smokeless tobacco: Never   Tobacco comments:    2-3 cigerettes a week.  Vaping Use   Vaping Use: Never used  Substance and Sexual Activity   Alcohol use: Not Currently    Alcohol/week: 0.0 standard drinks    Comment: occasonal    Drug use: Not Currently    Types: Marijuana    Comment: last use 3 months ago   Sexual activity: Yes    Birth control/protection: None  Other Topics Concern   Not on file  Social History Narrative   Not on file   Social Determinants of Health   Financial Resource Strain: Not on file  Food Insecurity: Food Insecurity Present   Worried About Running Out of Food in the Last Year: Never true   Ran Out of Food in the Last Year: Sometimes true  Transportation Needs: No Transportation Needs   Lack of Transportation (Medical): No   Lack of Transportation (Non-Medical): No  Physical Activity: Not on file  Stress: Not on file  Social Connections: Not on file  Intimate Partner Violence: Not on file    Family History  Problem Relation Age of Onset   Heart disease Mother    Sleep apnea Mother    Depression Mother    Hypertension Mother    Kidney disease Mother    Cancer Father    Diabetes Father    Heart disease Father    Colon cancer Father        72s   Hypertension Father    Diabetes Sister    Hypertension Sister    Heart disease Maternal Grandmother    Diabetes Maternal Grandfather    Heart disease Maternal Grandfather    Colon cancer Cousin        died age 78, paternal cousin    Medications Prior to Admission  Medication Sig Dispense Refill Last Dose   albuterol (VENTOLIN HFA) 108 (90 Base) MCG/ACT inhaler Inhale 2 puffs into the lungs every 6 (six) hours as needed for wheezing or shortness of breath. 1 each 2 Past Week   naproxen sodium (ALEVE) 220 MG tablet Take 440 mg by mouth 2 (two) times daily as needed (pain/headache).   Past Week   acetaminophen (TYLENOL) 325 MG tablet Take 2 tablets (650 mg total) by mouth  every 6 (six) hours as needed for mild pain or fever. (Patient not taking: Reported on 09/11/2021) 60 tablet 0 Not Taking   amoxicillin-clavulanate (AUGMENTIN) 875-125 MG tablet Take 1 tablet by mouth every 12 (twelve) hours. (Patient not taking: Reported on 09/11/2021) 14 tablet 0 Not Taking   cyclobenzaprine (FLEXERIL) 10 MG tablet Take 1 tablet (10 mg total) by mouth at bedtime. (Patient not taking: Reported on 07/14/2021) 30 tablet 0 Not Taking   docusate sodium (COLACE) 100 MG capsule Take 1 capsule (100 mg total) by mouth 2 (two) times daily as needed. (Patient not taking: Reported on 09/11/2021)   Not Taking  ibuprofen (ADVIL) 200 MG tablet Take 3 tablets (600 mg total) by mouth every 6 (six) hours as needed for moderate pain. (Patient not taking: Reported on 09/11/2021) 30 tablet 0 Not Taking   levothyroxine (SYNTHROID) 88 MCG tablet Take 1 tablet (88 mcg total) by mouth daily before breakfast. (Patient not taking: Reported on 09/11/2021) 30 tablet 0 Not Taking   levothyroxine (SYNTHROID) 88 MCG tablet Take 88 mcg by mouth daily before breakfast. (Patient not taking: Reported on 09/11/2021)   Not Taking   medroxyPROGESTERone (PROVERA) 10 MG tablet Take 1 tablet (10 mg total) by mouth daily. (Patient not taking: Reported on 09/11/2021) 10 tablet 0 Not Taking   polyethylene glycol (MIRALAX / GLYCOLAX) 17 g packet Take 17 g by mouth 2 (two) times daily as needed for moderate constipation or severe constipation. (Patient not taking: Reported on 09/11/2021)  0 Not Taking    Current Facility-Administered Medications  Medication Dose Route Frequency Provider Last Rate Last Admin   0.9 %  sodium chloride infusion   Intravenous Continuous Kathlen Mody, MD   Stopped at 09/16/21 0036   [MAR Hold] acetaminophen (TYLENOL) tablet 650 mg  650 mg Oral Q4H PRN Kathlen Mody, MD   650 mg at 09/15/21 2256   [MAR Hold] alum & mag hydroxide-simeth (MAALOX/MYLANTA) 200-200-20 MG/5ML suspension 30 mL  30 mL Oral  Q6H PRN Kathlen Mody, MD   30 mL at 09/16/21 0636   [MAR Hold] anidulafungin (ERAXIS) 100 mg in sodium chloride 0.9 % 100 mL IVPB  100 mg Intravenous Q24H Karie Soda, MD       bupivacaine liposome (EXPAREL) 1.3 % injection 266 mg  20 mL Infiltration Once Karie Soda, MD       cefoTEtan (CEFOTAN) 2 g in sodium chloride 0.9 % 100 mL IVPB  2 g Intravenous On Call to OR Karie Soda, MD       Mitzi Hansen Hold] cyclobenzaprine (FLEXERIL) tablet 10 mg  10 mg Oral Laurena Slimmer, MD   10 mg at 09/16/21 2008   [MAR Hold] diphenhydrAMINE (BENADRYL) injection 25 mg  25 mg Intravenous Q6H PRN Swayze, Ava, DO   25 mg at 09/16/21 2343   [facility] Hold] lactated ringers bolus 1,000 mL  1,000 mL Intravenous Q8H PRN Karie Soda, MD       lactated ringers infusion   Intravenous Continuous Karie Soda, MD 10 mL/hr at 09/17/21 1259 New Bag at 09/17/21 1259   [MAR Hold] levothyroxine (SYNTHROID) tablet 88 mcg  88 mcg Oral QAC breakfast Swayze, Ava, DO   88 mcg at 09/17/21 0548   [MAR Hold] medroxyPROGESTERone (PROVERA) tablet 10 mg  10 mg Oral Daily Swayze, Ava, DO   10 mg at 09/16/21 1008   [MAR Hold] methocarbamol (ROBAXIN) 500 mg in dextrose 5 % 50 mL IVPB  500 mg Intravenous Q6H PRN Almond Lint, MD 100 mL/hr at 09/17/21 0402 500 mg at 09/17/21 0402   [MAR Hold] morphine 2 MG/ML injection 2-4 mg  2-4 mg Intravenous Q2H PRN Almond Lint, MD   4 mg at 09/17/21 0700   [MAR Hold] ondansetron (ZOFRAN) injection 4 mg  4 mg Intravenous Q6H PRN Kathlen Mody, MD   4 mg at 09/15/21 2254   [MAR Hold] pantoprazole (PROTONIX) EC tablet 40 mg  40 mg Oral Daily Karie Soda, MD   40 mg at 09/17/21 0701   [MAR Hold] piperacillin-tazobactam (ZOSYN) IVPB 3.375 g  3.375 g Intravenous Q8H Shade, Christine E, RPH 12.5 mL/hr at 09/17/21 0701 3.375 g  at 09/17/21 0701   [MAR Hold] polyethylene glycol (MIRALAX / GLYCOLAX) packet 17 g  17 g Oral BID PRN Swayze, Ava, DO   17 g at 09/12/21 1542   [MAR Hold] potassium chloride (KLOR-CON)  packet 40 mEq  40 mEq Oral BID Karie Soda, MD   40 mEq at 09/16/21 2008     Allergies  Allergen Reactions   Dilaudid [Hydromorphone] Rash    Itching all over body    BP (!) 162/92   Pulse 80   Temp 97.8 F (36.6 C) (Oral)   Resp 18   Ht  (1.676 m)   Wt (!) 163.3 kg   SpO2 97%   BMI 58.11 kg/m   Labs: Results for orders placed or performed during the hospital encounter of 09/11/21 (from the past 48 hour(s))  Hemoglobin A1c     Status: None   Collection Time: 09/16/21  8:50 AM  Result Value Ref Range   Hgb A1c MFr Bld 5.4 4.8 - 5.6 %    Comment: (NOTE) Pre diabetes:          5.7%-6.4%  Diabetes:              >6.4%  Glycemic control for   <7.0% adults with diabetes    Mean Plasma Glucose 108.28 mg/dL    Comment: Performed at [facility], [address], [place], [place] [address]  Prealbumin     Status: Abnormal   Collection Time: 09/16/21  8:50 AM  Result Value Ref Range   Prealbumin 9.7 (L) 18 - 38 mg/dL    Comment: Performed at [facility], [address], [place], [place] [address]  Phosphorus     Status: None   Collection Time: 09/16/21  8:50 AM  Result Value Ref Range   Phosphorus 2.9 2.5 - 4.6 mg/dL    Comment: Performed at [facility], [address], [place], [place] [address]  Magnesium     Status: None   Collection Time: 09/16/21  8:50 AM  Result Value Ref Range   Magnesium 1.9 1.7 - 2.4 mg/dL    Comment: Performed at [facility], [address], [place], [place] [address]  Basic metabolic panel     Status: Abnormal   Collection Time: 09/16/21 12:56 PM  Result Value Ref Range   Sodium 136 135 - 145 mmol/L   Potassium 3.9 3.5 - 5.1 mmol/L   Chloride 102 98 - 111 mmol/L   CO2 25 22 - 32 mmol/L   Glucose, Bld 106 (H) 70 - 99 mg/dL    Comment: Glucose reference range applies only to samples taken after fasting for at least 8 hours.   BUN <5 (L) 6 - 20 mg/dL   Creatinine, Ser 2.95 0.44 - 1.00  mg/dL   Calcium 8.7 (L) 8.9 - 10.3 mg/dL   GFR, Estimated >62 >13 mL/min    Comment: (NOTE) Calculated using the CKD-EPI Creatinine Equation (2021)    Anion gap 9 5 - 15    Comment: Performed at [facility], [address], [place], [place] [address]  Basic metabolic panel     Status: Abnormal   Collection Time: 09/17/21  5:22 AM  Result Value Ref Range   Sodium 136 135 - 145 mmol/L   Potassium 3.6 3.5 - 5.1 mmol/L   Chloride 105 98 - 111 mmol/L   CO2 24 22 - 32 mmol/L   Glucose, Bld 91 70 - 99  mg/dL    Comment: Glucose reference range applies only to samples taken after fasting for at least 8 hours.   BUN <5 (L) 6 - 20 mg/dL   Creatinine, Ser 8.11 0.44 - 1.00 mg/dL   Calcium 8.9 8.9 - 91.4 mg/dL   GFR, Estimated >78 >29 mL/min    Comment: (NOTE) Calculated using the CKD-EPI Creatinine Equation (2021)    Anion gap 7 5 - 15    Comment: Performed at [facility], [address] [place], [place] [address]  CBC with Differential/Platelet     Status: Abnormal   Collection Time: 09/17/21  5:22 AM  Result Value Ref Range   WBC 8.6 4.0 - 10.5 K/uL   RBC 4.32 3.87 - 5.11 MIL/uL   Hemoglobin 13.6 12.0 - 15.0 g/dL   HCT 08.6 57.8 - 46.9 %   MCV 95.8 80.0 - 100.0 fL   MCH 31.5 26.0 - 34.0 pg   MCHC 32.9 30.0 - 36.0 g/dL   RDW 62.9 52.8 - 41.3 %   Platelets 193 150 - 400 K/uL   nRBC 0.0 0.0 - 0.2 %   Neutrophils Relative % 58 %   Neutro Abs 5.1 1.7 - 7.7 K/uL   Lymphocytes Relative 26 %   Lymphs Abs 2.2 0.7 - 4.0 K/uL   Monocytes Relative 10 %   Monocytes Absolute 0.8 0.1 - 1.0 K/uL   Eosinophils Relative 3 %   Eosinophils Absolute 0.2 0.0 - 0.5 K/uL   Basophils Relative 1 %   Basophils Absolute 0.1 0.0 - 0.1 K/uL   WBC Morphology MILD LEFT SHIFT (1-5% METAS, OCC MYELO, OCC BANDS)    Immature Granulocytes 2 %   Abs Immature Granulocytes 0.15 (H) 0.00 - 0.07 K/uL   Reactive, Benign Lymphocytes PRESENT     Comment: Performed at [facility], [address], [place], [place] [address]    Imaging / Studies: DG Chest 2 View  Result Date: 08/28/2021 CLINICAL DATA:  Shortness of breath EXAM: CHEST - 2 VIEW COMPARISON:  Chest radiograph 01/06/2017 FINDINGS: The cardiomediastinal silhouette is within normal limits. There is no focal consolidation or pulmonary edema. There is no pleural effusion or pneumothorax. There is no acute osseous abnormality. IMPRESSION: No radiographic evidence of acute cardiopulmonary process. Electronically Signed   By: Lesia Hausen M.D.   On: 08/28/2021 14:13   CT ABDOMEN PELVIS W CONTRAST  Addendum Date: 09/14/2021   ADDENDUM REPORT: 09/14/2021 16:48 ADDENDUM: These results were called by telephone at the time of interpretation on 09/14/2021 at 4:44 pm to provider Kathlen Mody MD, who verbally acknowledged these results. Electronically Signed   By: Tish Frederickson M.D.   On: 09/14/2021 16:48   Result Date: 09/14/2021 CLINICAL DATA:  Diverticulitis. F/u abscess after iv antibiotics Best imaging due to body habitus EXAM: CT ABDOMEN AND PELVIS WITH CONTRAST TECHNIQUE: Multidetector CT imaging of the abdomen and pelvis was performed using the standard protocol following bolus administration of intravenous contrast. CONTRAST:  OMNIPAQUE IOHEXOL 350 MG/ML SOLN COMPARISON:  CT abdomen pelvis 08/11/2021, CT abdomen pelvis 05/20/2021, CT abdomen pelvis 05/09/2021, CT abdomen pelvis 03/26/2020 FINDINGS: Lower chest: Interval development of a trace right pleural effusion. Associated passive atelectasis of right lower lobe. Bibasilar subsegmental atelectasis. Hepatobiliary: No focal liver abnormality. No gallstones, gallbladder wall thickening, or pericholecystic fluid. No biliary dilatation. Pancreas: No focal lesion. Normal pancreatic contour. No surrounding inflammatory changes. No main pancreatic ductal dilatation. Spleen: Normal in size without focal abnormality. Adrenals/Urinary  Tract: No adrenal  nodule bilaterally. Bilateral kidneys enhance symmetrically. No hydronephrosis. No hydroureter. The urinary bladder is unremarkable. No gas noted within the urinary bladder lumen or wall. Stomach/Bowel: Stomach is within normal limits. No evidence of bowel wall thickening or dilatation. Persistent marked bowel wall thickening and pericolonic fat stranding of the mid sigmoid colon in the setting of colonic diverticulosis. Associated perforation is again noted (2:71). Appendix appears normal. Vascular/Lymphatic: No abdominal aorta or iliac aneurysm. Mild atherosclerotic plaque of the aorta and its branches. Persistent prominent/borderline enlarged left retroperitoneal lymph nodes measuring up to 1.2 cm. No pelvic or inguinal lymphadenopathy. Reproductive: Uterus and bilateral adnexa are unremarkable. Other: Interval increase in of small volume free intraperitoneal gas. Interval increase in trace free fluid within the pelvis. Interval development of a region of free fluid and gas along the known segment of mid sigmoid diverticulitis likely representing a developing organized fluid collection/abscess. Interval increase in size of an abscess formation along the urinary bladder dome that now measures measures 1.9 cm (from 1.1 cm). Musculoskeletal: No abdominal wall hernia or abnormality. No suspicious lytic or blastic osseous lesions. No acute displaced fracture. Degenerative changes of the spine with intervertebral disc space vacuum phenomenon, posterior disc osteophyte complex, mild endplate sclerosis, osteophyte formation at the L5-S1 level. IMPRESSION: 1. Interval worsening of disease in a patient with complicated sigmoid diverticulitis. 2. Interval increase in small volume pneumoperitoneum consistent with perforation. 3. Interval development of a region of free fluid and gas along the known segment of mid sigmoid diverticulitis likely representing a developing organized fluid collection/abscess. Please consider use  of PO and IV contrast in following CT scans in this patient. 4. Interval increase in size of a 1.9 cm (from 1.1 cm) abscess along the urinary bladder dome. 5. Persistent prominent/borderline enlarged left retroperitoneal lymph nodes measuring up to 1.2 cm. Findings may be reactive in etiology. Recommend attention on follow-up. 6. Recommend colonoscopy status post treatment and status post complete resolution of inflammatory changes to exclude an underlying lesion. 7. Interval development of trace right pleural effusion. Electronically Signed: By: Tish Frederickson M.D. On: 09/14/2021 16:44   CT ABDOMEN PELVIS W CONTRAST  Result Date: 09/11/2021 CLINICAL DATA:  Left lower quadrant abdominal pain. EXAM: CT ABDOMEN AND PELVIS WITH CONTRAST TECHNIQUE: Multidetector CT imaging of the abdomen and pelvis was performed using the standard protocol following bolus administration of intravenous contrast. CONTRAST:  OMNIPAQUE IOHEXOL 300 MG/ML  SOLN COMPARISON:  08/28/2021 FINDINGS: Lower chest: Unremarkable Hepatobiliary: No suspicious focal abnormality within the liver parenchyma. There is no evidence for gallstones, gallbladder wall thickening, or pericholecystic fluid. No intrahepatic or extrahepatic biliary dilation. Pancreas: No focal mass lesion. No dilatation of the main duct. No intraparenchymal cyst. No peripancreatic edema. Spleen: No splenomegaly. No focal mass lesion. Adrenals/Urinary Tract: No adrenal nodule or mass. Kidneys unremarkable. No evidence for hydroureter. As noted previously, there is focal bladder wall thickening towards the anterior dome, adjacent to the sigmoid colon. Stomach/Bowel: Stomach is unremarkable. No gastric wall thickening. No evidence of outlet obstruction. Duodenum is normally positioned as is the ligament of Treitz. No small bowel wall thickening. No small bowel dilatation. The terminal ileum is normal. Wall thickening and pericolonic edema/inflammation associated with the mid  sigmoid colon is progressive since 08/28/2021. Extraluminal gas is now seen in the pericolonic fat cranial to the sigmoid colon (coronal image 84/series 5). Extraluminal gas dissects from the inferior wall the sigmoid colon into the wall of the bladder dome where there is a  2.7 x 1.7 cm collection of gas and fluid in focal area of marked bladder wall thickening (well demonstrated coronal image 78/5 and sagittal image 79 of series 6). Vascular/Lymphatic: No abdominal aortic aneurysm mild left para-aortic lymphadenopathy (image 40/2) is progressive in the interval with mild lymphadenopathy in the left common iliac chain. Reproductive: Unremarkable. Other: Small volume intraperitoneal free fluid. Musculoskeletal: No worrisome lytic or sclerotic osseous abnormality. Degenerative changes noted L5-S1 disc. IMPRESSION: 1. Interval progression of wall thickening and pericolonic edema/inflammation associated with the mid sigmoid colon since 08/28/2021. New extraluminal gas is now seen in the pericolonic fat consistent with micro perforation. There is also evidence for dissection of inflammatory process from the inferior wall of the sigmoid colon into the bladder dome where there is a 2.7 x 1.7 cm intramural collection of gas and fluid suggesting bladder wall abscess. 2. Interval development of mild left para-aortic and left common iliac chain lymphadenopathy, likely reactive. 3. Small volume intraperitoneal free fluid. Electronically Signed   By: [name] M.D.   On: 09/11/2021 06:09   CT Abdomen Pelvis W Contrast  Result Date: 08/28/2021 CLINICAL DATA:  Chest pain, shortness of breath, abdominal infection suspected EXAM: CT ABDOMEN AND PELVIS WITH CONTRAST TECHNIQUE: Multidetector CT imaging of the abdomen and pelvis was performed using the standard protocol following bolus administration of intravenous contrast. CONTRAST:  OMNIPAQUE IOHEXOL 350 MG/ML SOLN COMPARISON:  07/13/2021 FINDINGS: Lower chest: The  lung bases are clear. No pleural effusion or pericardial effusion. Hepatobiliary: No focal liver abnormality is seen. No gallstones, gallbladder wall thickening, or biliary dilatation. Pancreas: Unremarkable. No pancreatic ductal dilatation or surrounding inflammatory changes. Spleen: Normal in size without focal abnormality. Adrenals/Urinary Tract: Adrenal glands are unremarkable. Kidneys are normal, without renal calculi, focal lesion, or hydronephrosis. Thickening of the superior wall of the bladder, measuring up to 13 mm, similar to the prior exam, likely reactive secondary to the adjacent fluid collection. No definite visualized seen between the fluid collection in the bladder. Stomach/Bowel: Redemonstrated changes related to sigmoid diverticulitis, with redemonstrated pericolonic fluid collection, which now measures up to 1.9 x 1.0 x 1.2 cm (AP x TR x CC) (series 6, image 81 and series 2, image 79), previously 2.2 x 1.6 x 1.8 cm when remeasured similarly. Colon is otherwise within normal limits. Vascular/Lymphatic: No significant vascular findings are present. No enlarged abdominal or pelvic lymph nodes. Reproductive: Uterus and bilateral adnexa are unremarkable. Other: No abdominal wall hernia or abnormality. No abdominopelvic ascites. Musculoskeletal: No acute or significant osseous findings. IMPRESSION: Redemonstrated changes of diverticulitis, with a small abscess adjacent to the sigmoid colon, which is decreased in size compared to prior exam. The abscess is adjacent to the superior surface of the bladder, with focal bladder wall thickening, likely reactive, without definite fistula. No other acute process in the abdomen or pelvis. Electronically Signed   By: Wiliam Ke M.D.   On: 08/28/2021 16:19   ECHOCARDIOGRAM COMPLETE  Result Date: 09/14/2021    ECHOCARDIOGRAM REPORT   Patient Name:   Lori C Jimmerson Date of Exam: 09/14/2021 Medical Rec #:  [id number]    Height:       66.0 in Accession #:     [id number]   Weight:       360.0 lb Date of Birth:  1986-03-08    BSA:          2.567 m Patient Age:    35 years     BP:  136/80 mmHg Patient Gender: F            HR:           94 bpm. Exam Location:  Inpatient Procedure: 2D Echo, Cardiac Doppler, Color Doppler and Intracardiac            Opacification Agent Indications:    Bacteremia  History:        Patient has no prior history of Echocardiogram examinations.                 Signs/Symptoms:Shortness of Breath; Risk Factors:Hypertension.  Sonographer:    Eulah Pont RDCS Referring Phys: Herminio Heads  Sonographer Comments: Patient is morbidly obese. IMPRESSIONS  1. Technically difficult echo with poor image quality.  2. Left ventricular ejection fraction, by estimation, is 65 to 70%. The left ventricle has normal function. The left ventricle has no regional wall motion abnormalities. Left ventricular diastolic parameters were normal.  3. Right ventricular systolic function is normal. The right ventricular size is normal.  4. The mitral valve was not well visualized. Trivial mitral valve regurgitation.  5. The aortic valve was not well visualized. Aortic valve regurgitation is not visualized. FINDINGS  Left Ventricle: Left ventricular ejection fraction, by estimation, is 65 to 70%. The left ventricle has normal function. The left ventricle has no regional wall motion abnormalities. Definity contrast agent was given IV to delineate the left ventricular  endocardial borders. The left ventricular internal cavity size was small. There is no left ventricular hypertrophy. Left ventricular diastolic parameters were normal. Right Ventricle: The right ventricular size is normal. Right vetricular wall thickness was not well visualized. Right ventricular systolic function is normal. Left Atrium: Left atrial size was normal in size. Right Atrium: Right atrial size was normal in size. Pericardium: There is no evidence of pericardial effusion. Mitral Valve: The  mitral valve was not well visualized. Trivial mitral valve regurgitation. Tricuspid Valve: The tricuspid valve is not well visualized. Tricuspid valve regurgitation is not demonstrated. Aortic Valve: The aortic valve was not well visualized. Aortic valve regurgitation is not visualized. Pulmonic Valve: The pulmonic valve was not well visualized. Pulmonic valve regurgitation is not visualized. Aorta: The aortic root and ascending aorta are structurally normal, with no evidence of dilitation. IAS/Shunts: The interatrial septum was not well visualized. Additional Comments: Technically difficult echo with poor image quality.  LEFT VENTRICLE PLAX 2D LVIDd:         4.00 cm   Diastology LVIDs:         2.60 cm   LV e' medial:    7.51 cm/s LV PW:         1.10 cm   LV E/e' medial:  9.8 LV IVS:        1.10 cm   LV e' lateral:   9.79 cm/s LVOT diam:     2.10 cm   LV E/e' lateral: 7.5 LV SV:         47 LV SV Index:   18 LVOT Area:     3.46 cm  RIGHT VENTRICLE RV S prime:     13.50 cm/s TAPSE (M-mode): 1.8 cm LEFT ATRIUM           Index        RIGHT ATRIUM           Index LA diam:      3.60 cm 1.40 cm/m   RA Area:     20.70 cm LA Vol (A4C): 58.5 ml 22.79 ml/m  RA Volume:   63.20 ml  24.62 ml/m  AORTIC VALVE LVOT Vmax:   92.10 cm/s LVOT Vmean:  58.700 cm/s LVOT VTI:    0.137 m  AORTA Ao Root diam: 3.20 cm Ao Asc diam:  3.30 cm MITRAL VALVE MV Area (PHT): 5.75 cm    SHUNTS MV Decel Time: 132 msec    Systemic VTI:  0.14 m MV E velocity: 73.70 cm/s  Systemic Diam: 2.10 cm MV A velocity: 75.00 cm/s MV E/A ratio:  0.98 Kristeen Miss MD Electronically signed by Kristeen Miss MD Signature Date/Time: 09/14/2021/3:23:52 PM    Final      .Ardeth Sportsman, M.D., F.A.C.S. Gastrointestinal [facility] [address] [place], [place] [address] [phone] Main / Paging  09/17/2021 1:30 PM     Ardeth Sportsman

## 2021-09-17 NOTE — Progress Notes (Signed)
Pt refused cpap tonight.  Sister at bedside stated she only wears it sometimes at home.  RN aware.

## 2021-09-18 ENCOUNTER — Encounter (HOSPITAL_COMMUNITY): Payer: Self-pay | Admitting: Anesthesiology

## 2021-09-18 ENCOUNTER — Encounter (HOSPITAL_COMMUNITY): Payer: Self-pay | Admitting: Internal Medicine

## 2021-09-18 DIAGNOSIS — K651 Peritoneal abscess: Secondary | ICD-10-CM

## 2021-09-18 DIAGNOSIS — E876 Hypokalemia: Secondary | ICD-10-CM

## 2021-09-18 DIAGNOSIS — K658 Other peritonitis: Secondary | ICD-10-CM

## 2021-09-18 DIAGNOSIS — Z933 Colostomy status: Secondary | ICD-10-CM

## 2021-09-18 HISTORY — DX: Other peritonitis: K65.8

## 2021-09-18 HISTORY — DX: Peritoneal abscess: K65.1

## 2021-09-18 LAB — CBC
HCT: 46 % (ref 36.0–46.0)
Hemoglobin: 15.4 g/dL — ABNORMAL HIGH (ref 12.0–15.0)
MCH: 31.7 pg (ref 26.0–34.0)
MCHC: 33.5 g/dL (ref 30.0–36.0)
MCV: 94.7 fL (ref 80.0–100.0)
Platelets: 249 10*3/uL (ref 150–400)
RBC: 4.86 MIL/uL (ref 3.87–5.11)
RDW: 12.3 % (ref 11.5–15.5)
WBC: 19 10*3/uL — ABNORMAL HIGH (ref 4.0–10.5)
nRBC: 0 % (ref 0.0–0.2)

## 2021-09-18 LAB — BASIC METABOLIC PANEL
Anion gap: 9 (ref 5–15)
BUN: <5 mg/dL — ABNORMAL LOW (ref 6–20)
CO2: 23 mmol/L (ref 22–32)
Calcium: 8.5 mg/dL — ABNORMAL LOW (ref 8.9–10.3)
Chloride: 103 mmol/L (ref 98–111)
Creatinine, Ser: 0.71 mg/dL (ref 0.44–1.00)
GFR, Estimated: >60 mL/min (ref >60)
Glucose, Bld: 131 mg/dL — ABNORMAL HIGH (ref 70–99)
Potassium: 3.8 mmol/L (ref 3.5–5.1)
Sodium: 135 mmol/L (ref 135–145)

## 2021-09-18 LAB — MAGNESIUM: Magnesium: 1.5 mg/dL — ABNORMAL LOW (ref 1.7–2.4)

## 2021-09-18 LAB — TSH: TSH: 15.468 u[IU]/mL — ABNORMAL HIGH (ref 0.350–4.500)

## 2021-09-18 MED ORDER — GABAPENTIN 100 MG PO CAPS
100.0000 mg | ORAL_CAPSULE | Freq: Three times a day (TID) | ORAL | Status: DC
  Administered 2021-09-18 – 2021-09-19 (×4): 100 mg via ORAL
  Filled 2021-09-18 (×3): qty 1

## 2021-09-18 MED ORDER — ENOXAPARIN SODIUM 40 MG/0.4ML IJ SOSY
40.0000 mg | PREFILLED_SYRINGE | Freq: Two times a day (BID) | INTRAMUSCULAR | Status: DC
  Administered 2021-09-18 – 2021-09-19 (×3): 40 mg via SUBCUTANEOUS
  Filled 2021-09-18 (×2): qty 0.4

## 2021-09-18 MED ORDER — MAGNESIUM SULFATE 4 GM/100ML IV SOLN
4.0000 g | Freq: Once | INTRAVENOUS | Status: AC
  Administered 2021-09-18: 4 g via INTRAVENOUS
  Filled 2021-09-18: qty 100

## 2021-09-18 MED ORDER — LIP MEDEX EX OINT
TOPICAL_OINTMENT | CUTANEOUS | Status: DC | PRN
  Filled 2021-09-18: qty 7

## 2021-09-18 MED ORDER — MORPHINE SULFATE (PF) 2 MG/ML IV SOLN
2.0000 mg | INTRAVENOUS | Status: DC | PRN
Start: 2021-09-18 — End: 2021-09-24
  Administered 2021-09-18 – 2021-09-19 (×9): 4 mg via INTRAVENOUS
  Administered 2021-09-20: 6 mg via INTRAVENOUS
  Administered 2021-09-20 – 2021-09-21 (×5): 4 mg via INTRAVENOUS
  Administered 2021-09-21: 6 mg via INTRAVENOUS
  Administered 2021-09-21 – 2021-09-24 (×11): 4 mg via INTRAVENOUS
  Filled 2021-09-18 (×3): qty 2
  Filled 2021-09-18: qty 3
  Filled 2021-09-18 (×20): qty 2
  Filled 2021-09-18: qty 3
  Filled 2021-09-18: qty 2

## 2021-09-18 NOTE — Consult Note (Addendum)
WOC Nurse ostomy consult note Pt had colostomy surgery performed yesterday.  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  She requests first post-op pouch change and teaching session be performed when her sister is present Friday.  She will determine the time later. Stoma type/location: Stoma is red and viable, flush with skin level when visualized through the pouch, which is intact with good seal.  Small amt bloody liquid in the pouch Ostomy pouching: 1pc. Education provided: Educational materials left at the bedside and ordered 5 sets of supplies: Use Supplies: barrier ring, [place] # [id number] and convex Gigi Gin # [id number] Enrolled patient in [facility] DC program: Not yet Cammie Mcgee MSN, RN, [place], [place], CNS [phone]

## 2021-09-18 NOTE — Progress Notes (Signed)
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  CT of the abdomen and pelvis demonstrated increase in the size of the abscess.  She was started on IV Zosyn and general surgery consulted. General surgery recommended IV antibiotics and clear sips. In view of her worsening abscesses and pneumoperitoneum, she is scheduled for robotic assisted sigmoid colectomy with cystoscopy with firefly injection today.      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 Active Problems:   Essential hypertension   Hypothyroid   Anemia   Obesity, morbid, BMI 50 or higher (HCC)   Endometriosis s/p ablation   Family history of colon cancer in father   Hypokalemia   Asthma, mild intermittent   Hypomagnesemia   Fecal peritonitis (HCC)   Intra-abdominal abscess s/p robotic drainage 09/18/2021   Colostomy in place [facility])   #1 acute diverticulitis of the large intestine with abscess formation/perforated sigmoid colon likely secondary to diverticulitis/feculent peritonitis with abscesses x3 -Patient noted to have recurrent episodes of acute diverticulitis. -Dr. Gerri Lins discussed with general surgery, Dr. Cliffton Asters, noting that abscess had increased in size however remained too small for percutaneous drainage. -Due to patient's body habitus it was felt patient was high risk for complications and having drain being able to reach the skin. -General surgery following initially recommended treatment with IV antibiotics and watchful waiting. -Not abscess sitting between sigmoid  colon and bladder likelihood of fistula formation allowing for natural drainage of abscess. -Patient's condition continued to worsen with worsening pneumoperitoneum and interval development of free fluid and gas along with known segment of mid sigmoid diverticulitis. -Patient was being followed by general surgery and subsequently underwent robotic assisted sigmoid colectomy with cystoscopy with firefly injection 09/17/2021. -Continue empiric IV antibiotics, IV fluids, pain management. -IV Eraxis added per general surgery. -Management per general surgery.  2.  Staph epidermis bacteremia -Likely contamination. -Repeat blood cultures pending with no growth to date. -Continue empiric IV Zosyn.  3.  Morbid obesity -Lifestyle changes.  Outpatient follow-up with PCP.  4.  Hypertension -Follow.  Some bouts of elevated blood pressure likely secondary to pain.  5.  Hypothyroidism -Continue home regimen Synthroid.  6.  Depression/anxiety -Not on any antidepressants on med rec. -Stable. -Outpatient follow-up.  7.  Hypomagnesemia -IV magnesium ordered already. -Repeat labs in the a.m.  8.  Leukocytosis -Patient with a worsening leukocytosis postoperatively, likely reactive in the setting of acute diverticulitis with perforation.  -On IV Zosyn. -IV Eraxis added per general surgery. -Follow.  9.  History of PCOS -Outpatient follow-up with PCP.  10.  Chest tightness -Resolved.  11.  Hypokalemia -Repleted.   DVT prophylaxis: Lovenox Code Status: Full Family Communication: Updated patient.  No family at bedside. Disposition:   Status is: Inpatient  Remains inpatient appropriate because: Severity of illness.       Consultants:  WOC RN: Cammie Mcgee, RN 09/18/2021 WOCN RN Lawson Fiscal McNicol's, RN 09/16/2021 General surgery: Dr. Cliffton Asters 09/11/2021  Procedures: Cystoscopy with bilateral ureteral catheterization and firefly injection per Dr.Machen, urology 09/17/2021 Robotic assisted  sigmoid colectomy with end colostomy/mobilization of splenic flexure  of colon/drainage of abscesses x3/robotic lysis of adhesions/transversus abdominis plane block bilaterally per Dr. Michaell Cowing, general surgery 09/17/2021 CT abdomen and pelvis 09/11/2021, 09/14/2021 2D echo 09/14/2021   Antimicrobials:  IV Eraxis 09/17/2021>>>>> 09/22/2021 IV Zosyn 09/11/2021>>>>> 09/22/2021   Subjective: Patient sitting up in chair.  Complain of abdominal pain/discomfort.  No nausea or vomiting.  No shortness of breath.  No chest pain.  Objective: Vitals:   09/17/21 2117 09/18/21 0017 09/18/21 0402 09/18/21 0800  BP: (!) 146/96 (!) 158/117 132/86 (!) 157/116  Pulse: 97 98 100 84  Resp: (!) 23 16 20 20   Temp: 98.3 F (36.8 C) 98.2 F (36.8 C) 99 F (37.2 C) 97.8 F (36.6 C)  TempSrc: Oral Oral Oral Oral  SpO2: 97% 99% 99% 98%  Weight:      Height:        Intake/Output Summary (Last 24 hours) at 09/18/2021 1103 Last data filed at 09/18/2021 1015 Gross per 24 hour  Intake 3915.55 ml  Output 3225 ml  Net 690.55 ml   Filed Weights   09/11/21 0303 09/17/21 1256  Weight: (!) 163.3 kg (!) 163.3 kg    Examination:  General exam: Appears calm and comfortable  Respiratory system: Clear to auscultation anterior lung fields.  No wheezes, no crackles, no rhonchi. Respiratory effort normal. Cardiovascular system: S1 & S2 heard, RRR. No JVD, murmurs, rubs, gallops or clicks. No pedal edema. Gastrointestinal system: Abdomen is nondistended, soft and some diffuse tenderness to palpation around incision sites.  Normal bowel sounds heard.  Colostomy with red stoma. Central nervous system: Alert and oriented. No focal neurological deficits. Extremities: Symmetric 5 x 5 power. Skin: No rashes, lesions or ulcers Psychiatry: Judgement and insight appear normal. Mood & affect appropriate.     Data Reviewed: I have personally reviewed following labs and imaging studies  CBC: Recent Labs  Lab 09/13/21 0539  09/14/21 0748 09/15/21 0533 09/17/21 0522 09/18/21 0539  WBC 10.3 14.1* 10.9* 8.6 19.0*  NEUTROABS  --   --   --  5.1  --   HGB 14.7 15.0 14.6 13.6 15.4*  HCT 43.3 45.8 44.0 41.4 46.0  MCV 95.2 96.4 95.2 95.8 94.7  PLT 154 186 197 193 249    Basic Metabolic Panel: Recent Labs  Lab 09/13/21 0539 09/15/21 0533 09/16/21 0850 09/16/21 1256 09/17/21 0522 09/18/21 0539  NA 133* 133*  --  136 136 135  K 3.4* 3.4*  --  3.9 3.6 3.8  CL 101 103  --  102 105 103  CO2 22 25  --  25 24 23   GLUCOSE 91 85  --  106* 91 131*  BUN 8 5*  --  <5* <5* <5*  CREATININE 0.77 0.81  --  0.77 0.60 0.71  CALCIUM 8.4* 8.4*  --  8.7* 8.9 8.5*  MG  --   --  1.9  --   --  1.5*  PHOS  --   --  2.9  --   --   --     GFR: Estimated Creatinine Clearance: 154.9 mL/min (by C-G formula based on SCr of 0.71 mg/dL).  Liver Function Tests: No results for input(s): AST, ALT, ALKPHOS, BILITOT, PROT, ALBUMIN in the last 168 hours.  CBG: No results for input(s): GLUCAP in the last 168 hours.   Recent Results (from the past 240 hour(s))  Blood culture (routine x 2)     Status: Abnormal   Collection Time: 09/11/21  6:42 AM   Specimen: BLOOD  Result  Value Ref Range Status   Specimen Description   Final    BLOOD BLOOD LEFT FOREARM Performed at [facility], [address] [place], [place] [address]    Special Requests   Final    Blood Culture adequate volume BOTTLES DRAWN AEROBIC AND ANAEROBIC Performed at [facility], [address] [place], [place] [address]    Culture  Setup Time   Final    GRAM POSITIVE COCCI IN BOTH AEROBIC AND ANAEROBIC BOTTLES CRITICAL RESULT CALLED TO, READ BACK BY AND VERIFIED WITH: PHARMD K.SHADE AT 1213 ON 09/12/2021 BY T.SAAD.    Culture (A)  Final    STAPHYLOCOCCUS EPIDERMIDIS THE SIGNIFICANCE OF ISOLATING THIS ORGANISM FROM A SINGLE VENIPUNCTURE CANNOT BE PREDICTED WITHOUT FURTHER CLINICAL AND CULTURE CORRELATION.  SUSCEPTIBILITIES AVAILABLE ONLY ON REQUEST. Performed at [facility], [address], [place], [place] [address]    Report Status 09/14/2021 FINAL  Final  Blood Culture ID Panel (Reflexed)     Status: Abnormal   Collection Time: 09/11/21  6:42 AM  Result Value Ref Range Status   Enterococcus faecalis NOT DETECTED NOT DETECTED Final   Enterococcus Faecium NOT DETECTED NOT DETECTED Final   Listeria monocytogenes NOT DETECTED NOT DETECTED Final   Staphylococcus species DETECTED (A) NOT DETECTED Final    Comment: CRITICAL RESULT CALLED TO, READ BACK BY AND VERIFIED WITH: PHARMD K.SHADE AT 1213 ON 09/12/2021 BY T.SAAD.    Staphylococcus aureus (BCID) NOT DETECTED NOT DETECTED Final   Staphylococcus epidermidis DETECTED (A) NOT DETECTED Final    Comment: CRITICAL RESULT CALLED TO, READ BACK BY AND VERIFIED WITH: PHARMD K.SHADE AT 1213 ON 09/12/2021 BY T.SAAD.    Staphylococcus lugdunensis NOT DETECTED NOT DETECTED Final   Streptococcus species NOT DETECTED NOT DETECTED Final   Streptococcus agalactiae NOT DETECTED NOT DETECTED Final   Streptococcus pneumoniae NOT DETECTED NOT DETECTED Final   Streptococcus pyogenes NOT DETECTED NOT DETECTED Final   A.calcoaceticus-baumannii NOT DETECTED NOT DETECTED Final   Bacteroides fragilis NOT DETECTED NOT DETECTED Final   Enterobacterales NOT DETECTED NOT DETECTED Final   Enterobacter cloacae complex NOT DETECTED NOT DETECTED Final   Escherichia coli NOT DETECTED NOT DETECTED Final   Klebsiella aerogenes NOT DETECTED NOT DETECTED Final   Klebsiella oxytoca NOT DETECTED NOT DETECTED Final   Klebsiella pneumoniae NOT DETECTED NOT DETECTED Final   Proteus species NOT DETECTED NOT DETECTED Final   Salmonella species NOT DETECTED NOT DETECTED Final   Serratia marcescens NOT DETECTED NOT DETECTED Final   Haemophilus influenzae NOT DETECTED NOT DETECTED Final   Neisseria meningitidis NOT DETECTED NOT DETECTED Final   Pseudomonas aeruginosa NOT  DETECTED NOT DETECTED Final   Stenotrophomonas maltophilia NOT DETECTED NOT DETECTED Final   Candida albicans NOT DETECTED NOT DETECTED Final   Candida auris NOT DETECTED NOT DETECTED Final   Candida glabrata NOT DETECTED NOT DETECTED Final   Candida krusei NOT DETECTED NOT DETECTED Final   Candida parapsilosis NOT DETECTED NOT DETECTED Final   Candida tropicalis NOT DETECTED NOT DETECTED Final   Cryptococcus neoformans/gattii NOT DETECTED NOT DETECTED Final   Methicillin resistance mecA/C NOT DETECTED NOT DETECTED Final    Comment: Performed at [facility], [address], [place], [place] [address]  Resp Panel by RT-PCR (Flu A&B, Covid) Nasopharyngeal Swab     Status: None   Collection Time: 09/11/21 10:45 AM   Specimen: Nasopharyngeal Swab; Nasopharyngeal(NP) swabs in vial transport medium  Result Value Ref Range Status  SARS Coronavirus 2 by RT PCR NEGATIVE NEGATIVE Final    Comment: (NOTE) SARS-CoV-2 target nucleic acids are NOT DETECTED.  The SARS-CoV-2 RNA is generally detectable in upper respiratory specimens during the acute phase of infection. The lowest concentration of SARS-CoV-2 viral copies this assay can detect is 138 copies/mL. A negative result does not preclude SARS-Cov-2 infection and should not be used as the sole basis for treatment or other patient management decisions. A negative result may occur with  improper specimen collection/handling, submission of specimen other than nasopharyngeal swab, presence of viral mutation(s) within the areas targeted by this assay, and inadequate number of viral copies(<138 copies/mL). A negative result must be combined with clinical observations, patient history, and epidemiological information. The expected result is Negative.  Fact Sheet for Patients:  BloggerCourse.com  Fact Sheet for Healthcare Providers:  SeriousBroker.it  This test is no t yet approved or  cleared by the [place] FDA and  has been authorized for detection and/or diagnosis of SARS-CoV-2 by FDA under an Emergency Use Authorization (EUA). This EUA will remain  in effect (meaning this test can be used) for the duration of the COVID-19 declaration under Section 564(b)(1) of the Act, 21 U.S.C.section 360bbb-3(b)(1), unless the authorization is terminated  or revoked sooner.       Influenza A by PCR NEGATIVE NEGATIVE Final   Influenza B by PCR NEGATIVE NEGATIVE Final    Comment: (NOTE) The Xpert Xpress SARS-CoV-2/FLU/RSV plus assay is intended as an aid in the diagnosis of influenza from Nasopharyngeal swab specimens and should not be used as a sole basis for treatment. Nasal washings and aspirates are unacceptable for Xpert Xpress SARS-CoV-2/FLU/RSV testing.  Fact Sheet for Patients: BloggerCourse.com  Fact Sheet for Healthcare Providers: SeriousBroker.it  This test is not yet approved or cleared by the [place] FDA and has been authorized for detection and/or diagnosis of SARS-CoV-2 by FDA under an Emergency Use Authorization (EUA). This EUA will remain in effect (meaning this test can be used) for the duration of the COVID-19 declaration under Section 564(b)(1) of the Act, 21 U.S.C. section 360bbb-3(b)(1), unless the authorization is terminated or revoked.  Performed at [facility], [address], [place], [place] [address]   Blood culture (routine x 2)     Status: Abnormal   Collection Time: 09/11/21  3:52 PM   Specimen: BLOOD RIGHT HAND  Result Value Ref Range Status   Specimen Description   Final    BLOOD RIGHT HAND Performed at [facility], [address] [place], [place] [address]    Special Requests   Final    BOTTLES DRAWN AEROBIC AND ANAEROBIC Blood Culture adequate volume Performed at [facility], [address] [place],  [place] [address]    Culture  Setup Time   Final    GRAM POSITIVE COCCI IN CLUSTERS ANAEROBIC BOTTLE ONLY CRITICAL RESULT CALLED TO, READ BACK BY AND VERIFIED WITH: Damaris Hippo PHARMD, AT 5643 09/13/21 D. VANHOOK    Culture (A)  Final    STAPHYLOCOCCUS HOMINIS THE SIGNIFICANCE OF ISOLATING THIS ORGANISM FROM A SINGLE SET OF BLOOD CULTURES WHEN MULTIPLE SETS ARE DRAWN IS UNCERTAIN. PLEASE NOTIFY THE MICROBIOLOGY DEPARTMENT WITHIN ONE WEEK IF SPECIATION AND SENSITIVITIES ARE REQUIRED. Performed at [facility], [address], [place], [place] [address]    Report Status 09/15/2021 FINAL  Final  Culture, blood (Routine X 2) w Reflex to ID Panel     Status: None  Collection Time: 09/11/21  5:14 PM   Specimen: BLOOD  Result Value Ref Range Status   Specimen Description   Final    BLOOD BLOOD LEFT HAND Performed at [facility], [address], [place], [place] [address]    Special Requests   Final    BOTTLES DRAWN AEROBIC ONLY Blood Culture results may not be optimal due to an inadequate volume of blood received in culture bottles Performed at [facility], [address], [place], [place] [address]    Culture   Final    NO GROWTH 5 DAYS Performed at [facility], [address], [place], [place] [address]    Report Status 09/17/2021 FINAL  Final  Culture, blood (Routine X 2) w Reflex to ID Panel     Status: None (Preliminary result)   Collection Time: 09/14/21 12:40 PM   Specimen: BLOOD  Result Value Ref Range Status   Specimen Description   Final    BLOOD RIGHT ANTECUBITAL Performed at [facility], [address] [place], [place] [address]    Special Requests   Final    BOTTLES DRAWN AEROBIC ONLY Blood Culture adequate volume Performed at [facility], [address] [place], [place] [address]    Culture   Final    NO GROWTH 4 DAYS Performed at [facility], [address]  [place], [place] [address]    Report Status PENDING  Incomplete  Culture, blood (Routine X 2) w Reflex to ID Panel     Status: None (Preliminary result)   Collection Time: 09/14/21 12:40 PM   Specimen: BLOOD  Result Value Ref Range Status   Specimen Description   Final    BLOOD BLOOD LEFT HAND Performed at [facility], [address], [place], [place] [address]    Special Requests   Final    BOTTLES DRAWN AEROBIC ONLY Blood Culture adequate volume Performed at [facility], [address] [place], [place] [address]    Culture   Final    NO GROWTH 4 DAYS Performed at [facility], [address] [place], [place] [address]    Report Status PENDING  Incomplete         Radiology Studies: No results found.      Scheduled Meds:  acetaminophen  1,000 mg Oral Q6H   alvimopan  12 mg Oral BID   Chlorhexidine Gluconate Cloth  6 each Topical Daily   cyclobenzaprine  10 mg Oral QHS   enoxaparin (LOVENOX) injection  40 mg Subcutaneous Q12H   feeding supplement  237 mL Oral BID BM   gabapentin  100 mg Oral TID   levothyroxine  88 mcg Oral QAC breakfast   medroxyPROGESTERone  10 mg Oral Daily   pantoprazole  40 mg Oral Daily   potassium chloride  40 mEq Oral BID   Continuous Infusions:  anidulafungin     lactated ringers     lactated ringers 50 mL/hr at 09/17/21 2211   magnesium sulfate bolus IVPB     methocarbamol (ROBAXIN) IV 500 mg (09/18/21 0408)   piperacillin-tazobactam (ZOSYN)  IV 3.375 g (09/18/21 1009)     LOS: 7 days    Time spent: 40 minutes    Ramiro Harvest, MD [facility]   To contact the attending provider between 7A-7P or the covering provider during after hours 7P-7A, please log into the web site [email or web address] and access using universal  [place] password for that web site. If you do not have the password, please call the hospital operator.  09/18/2021, 11:03 AM

## 2021-09-18 NOTE — Progress Notes (Signed)
Lori Mcknight [id number] 01-10-1986  CARE TEAM:  PCP: [name], NP  Outpatient Care Team: Patient Care Team: [name], NP as PCP - General (Nurse Practitioner) [name], MD (Inactive) as Consulting Physician (Obstetrics and Gynecology) [name], MD as Consulting Physician (Colon and Rectal Surgery) Ccs, Md, MD as Consulting Physician (General Surgery) [name], MD as Consulting Physician (Urology)  Inpatient Treatment Team: Treatment Team: Attending Provider: [name], MD; Consulting Physician: [name], Md, MD; Consulting Physician: [name], MD; Social Worker: Merri Brunette; Walsh Nurse: Lissa Morales Rudi Heap, RN; Charge Nurse: Virginia Rochester, RN; Consulting Physician: [name], MD; Rounding Team: Fatima Blank, MD; Registered Nurse: Effie Berkshire, RN; Registered Nurse: Shelah Lewandowsky, RN   Problem List:   Principal Problem:   Diverticulitis of large intestine with abscess Active Problems:   Obesity, morbid, BMI 43 or higher ([place])   Family history of colon cancer in father   PCOS (polycystic ovarian syndrome)   Essential hypertension   Hypothyroid   Anemia   Asthma, mild intermittent   Perforation of sigmoid colon due to diverticulitis   1 Day Post-Op  09/17/2021  POST-OPERATIVE DIAGNOSIS:   Perforated sigmoid colon most likely due to diverticulitis  Feculent peritonitis Abscesses x 3   PROCEDURE:   XI ROBOT ASSISTED SIGMOID COLECTOMY WITH END COLOSTOMY (HARTMANN) MOBILIZATION OF SPLENIC FLEXURE OF COLON DRAINAGE OF ABSCESSES X 3 ROBOTIC LYSIS OF ADHESIONS TRANSVERSUS ABDOMINIS PLANE (TAP) BLOCK - BILATERAL   SURGEON:  [name], MD   OR FINDINGS:    Patient had large phlegmon of old mentum and mid ileum adherent to a perforated sigmoid colon.  Interloop abscess with feculent peritonitis.  Left adnexa Fallopian tube very dilated with purulence consistent with secondary tubo-ovarian abscess.   Very dense adhesions to the left posterolateral bladder dome with moderate abscess and purulence there as well.  All these 3 abscesses were drained.  Bladder distended and no evidence of any active colovesical fistula.  No evidence of any appendicitis.  Some dilatation consistent with probable partial small bowel obstruction at the site of the small bowel trying to contain the perforated colon with feculent peritonitis.  Therefore no anastomosis done.  End Hartmann resection.  No obvious metastatic disease on visceral parietal peritoneum or liver.   CASE DATA:   Type of patient?: LDOW CASE (Surgical Hospitalist [facility] Inpatient)   Status of Case? URGENT Add On   Infection Present At Time Of Surgery (PATOS)?   Feculent peritonitis, phlegmon, abscesses x3.      Assessment  Stable  [facility] Stay = 7 days)  Plan:  -IV ABx x 5d postop (Zosyn.  I added Eraxis given chronicity and persistent abscess for almost 2 years)  -ERAS protocol.  -f/u pathology - favor diverticulitis  -colostomy care and teaching  -Replace electrolytes.  Hypomagnesemia.  -VTE prophylaxis- SCDs.  Enoxaparin OK - prob increase per pharmacy given BMI 58  -I saw no strong evidence of polycystic ovarian syndrome interoperatively nor prior ultrasounds but will defer to medicine and gynecology if that truly needs to be treated  -mobilize as tolerated to help recovery.  She seemed to want to stay in bed with the air mattress.  See if therapist can be involved to help her mobilize safely  -Moderate malnutrition given her prolonged diverticulitis and chronic abscess.  Supplemental shakes as tolerated.  Hopefully should resolve.  -Hopefully can do second surgery in 3-6 months once this episode  has resolved finally.  -No evidence of colovesical fistula despite abscess on bladder.  Can remove per protocol POD#1.  I&O cath as needed  Disposition: Per primary service.  Anticipate discharge next week with home health for  colostomy.  Her financial and other issues make her recovery challenging.       40 minutes spent in review, evaluation, examination, counseling, and coordination of care.   I have reviewed this patient's available data, including medical history, events of note, physical examination and test results as part of my evaluation.  A significant portion of that time was spent in counseling.  Care during the described time interval was provided by me.  09/18/2021    Subjective: (Chief complaint)  Patient resting in room.  Sister also there.  Pain controlled.  Tolerating liquids  Objective:  Vital signs:  Vitals:   09/17/21 2055 09/17/21 2117 09/18/21 0017 09/18/21 0402  BP: (!) 129/109 (!) 146/96 (!) 158/117 132/86  Pulse: 96 97 98 100  Resp: (!) 25 (!) 23 16 20   Temp: 98 F (36.7 C) 98.3 F (36.8 C) 98.2 F (36.8 C) 99 F (37.2 C)  TempSrc:  Oral Oral Oral  SpO2: 97% 97% 99% 99%  Weight:      Height:        Last BM Date: 09/16/21  Intake/Output   Yesterday:  12/06 0701 - 12/07 0700 In: 3915.6 [I.V.:3535.6; IV Piggyback:380] Out: 2125 [Urine:1525; Drains:400; Blood:200] This shift:  No intake/output data recorded.  Bowel function:  Flatus: No  BM:  No  Drain: Serosanguinous   Physical Exam:  General: Pt awake/alert in no acute distress Eyes: PERRL, normal EOM.  Sclera clear.  No icterus Neuro: CN II-XII intact w/o focal sensory/motor deficits. Lymph: No head/neck/groin lymphadenopathy Psych:  No delerium/psychosis/paranoia.  Oriented x 4 HENT: Normocephalic, Mucus membranes moist.  No thrush Neck: Supple, No tracheal deviation.  No obvious thyromegaly Chest: No pain to chest wall compression.  Good respiratory excursion.  No audible wheezing CV:  Pulses intact.  Regular rhythm.  No major extremity edema MS: Normal AROM mjr joints.  No obvious deformity  Abdomen: Morbidly obese Soft.  Nondistended.  Mildly tender at incisions only.  No evidence of  peritonitis.  No incarcerated hernias. Left-sided colostomy pink.  No major gas.  Scant stool in bag  Ext:   No deformity.  No mjr edema.  No cyanosis Skin: No petechiae / purpurea.  No major sores.  Warm and dry    Results:   Cultures: Recent Results (from the past 720 hour(s))  Resp Panel by RT-PCR (Flu A&B, Covid) Nasopharyngeal Swab     Status: None   Collection Time: 08/28/21  1:54 PM   Specimen: Nasopharyngeal Swab; Nasopharyngeal(NP) swabs in vial transport medium  Result Value Ref Range Status   SARS Coronavirus 2 by RT PCR NEGATIVE NEGATIVE Final    Comment: (NOTE) SARS-CoV-2 target nucleic acids are NOT DETECTED.  The SARS-CoV-2 RNA is generally detectable in upper respiratory specimens during the acute phase of infection. The lowest concentration of SARS-CoV-2 viral copies this assay can detect is 138 copies/mL. A negative result does not preclude SARS-Cov-2 infection and should not be used as the sole basis for treatment or other patient management decisions. A negative result may occur with  improper specimen collection/handling, submission of specimen other than nasopharyngeal swab, presence of viral mutation(s) within the areas targeted by this assay, and inadequate number of viral copies(<138 copies/mL). A negative result must be combined with clinical  observations, patient history, and epidemiological information. The expected result is Negative.  Fact Sheet for Patients:  EntrepreneurPulse.com.au  Fact Sheet for Healthcare Providers:  IncredibleEmployment.be  This test is no t yet approved or cleared by the [place] FDA and  has been authorized for detection and/or diagnosis of SARS-CoV-2 by FDA under an Emergency Use Authorization (EUA). This EUA will remain  in effect (meaning this test can be used) for the duration of the COVID-19 declaration under Section 564(b)(1) of the Act, 21 U.S.C.section 360bbb-3(b)(1),  unless the authorization is terminated  or revoked sooner.       Influenza A by PCR NEGATIVE NEGATIVE Final   Influenza B by PCR NEGATIVE NEGATIVE Final    Comment: (NOTE) The Xpert Xpress SARS-CoV-2/FLU/RSV plus assay is intended as an aid in the diagnosis of influenza from Nasopharyngeal swab specimens and should not be used as a sole basis for treatment. Nasal washings and aspirates are unacceptable for Xpert Xpress SARS-CoV-2/FLU/RSV testing.  Fact Sheet for Patients: EntrepreneurPulse.com.au  Fact Sheet for Healthcare Providers: IncredibleEmployment.be  This test is not yet approved or cleared by the [place] FDA and has been authorized for detection and/or diagnosis of SARS-CoV-2 by FDA under an Emergency Use Authorization (EUA). This EUA will remain in effect (meaning this test can be used) for the duration of the COVID-19 declaration under Section 564(b)(1) of the Act, 21 U.S.C. section 360bbb-3(b)(1), unless the authorization is terminated or revoked.  Performed at [facility], [address] [place], [place] [address]   Blood culture (routine x 2)     Status: Abnormal   Collection Time: 09/11/21  6:42 AM   Specimen: BLOOD  Result Value Ref Range Status   Specimen Description   Final    BLOOD BLOOD LEFT FOREARM Performed at [facility], [address] [place], [place] [address]    Special Requests   Final    Blood Culture adequate volume BOTTLES DRAWN AEROBIC AND ANAEROBIC Performed at [facility], [place], [place] [address]    Culture  Setup Time   Final    GRAM POSITIVE COCCI IN BOTH AEROBIC AND ANAEROBIC BOTTLES CRITICAL RESULT CALLED TO, READ BACK BY AND VERIFIED WITH: PHARMD K.SHADE AT 1213 ON 09/12/2021 BY T.SAAD.    Culture (A)  Final    STAPHYLOCOCCUS EPIDERMIDIS THE SIGNIFICANCE OF ISOLATING THIS ORGANISM FROM A SINGLE  VENIPUNCTURE CANNOT BE PREDICTED WITHOUT FURTHER CLINICAL AND CULTURE CORRELATION. SUSCEPTIBILITIES AVAILABLE ONLY ON REQUEST. Performed at [facility], [place] [address] [place],  [address]    Report Status 09/14/2021 FINAL  Final  Blood Culture ID Panel (Reflexed)     Status: Abnormal   Collection Time: 09/11/21  6:42 AM  Result Value Ref Range Status   Enterococcus faecalis NOT DETECTED NOT DETECTED Final   Enterococcus Faecium NOT DETECTED NOT DETECTED Final   Listeria monocytogenes NOT DETECTED NOT DETECTED Final   Staphylococcus species DETECTED (A) NOT DETECTED Final    Comment: CRITICAL RESULT CALLED TO, READ BACK BY AND VERIFIED WITH: PHARMD K.SHADE AT 1213 ON 09/12/2021 BY T.SAAD.    Staphylococcus aureus (BCID) NOT DETECTED NOT DETECTED Final   Staphylococcus epidermidis DETECTED (A) NOT DETECTED Final    Comment: CRITICAL RESULT CALLED TO, READ BACK BY AND VERIFIED WITH: PHARMD K.SHADE AT 1213 ON 09/12/2021 BY T.SAAD.    Staphylococcus lugdunensis NOT DETECTED NOT DETECTED Final   Streptococcus species NOT DETECTED NOT DETECTED Final   Streptococcus agalactiae NOT  DETECTED NOT DETECTED Final   Streptococcus pneumoniae NOT DETECTED NOT DETECTED Final   Streptococcus pyogenes NOT DETECTED NOT DETECTED Final   A.calcoaceticus-baumannii NOT DETECTED NOT DETECTED Final   Bacteroides fragilis NOT DETECTED NOT DETECTED Final   Enterobacterales NOT DETECTED NOT DETECTED Final   Enterobacter cloacae complex NOT DETECTED NOT DETECTED Final   Escherichia coli NOT DETECTED NOT DETECTED Final   Klebsiella aerogenes NOT DETECTED NOT DETECTED Final   Klebsiella oxytoca NOT DETECTED NOT DETECTED Final   Klebsiella pneumoniae NOT DETECTED NOT DETECTED Final   Proteus species NOT DETECTED NOT DETECTED Final   Salmonella species NOT DETECTED NOT DETECTED Final   Serratia marcescens NOT DETECTED NOT DETECTED Final   Haemophilus influenzae NOT DETECTED NOT DETECTED Final    Neisseria meningitidis NOT DETECTED NOT DETECTED Final   Pseudomonas aeruginosa NOT DETECTED NOT DETECTED Final   Stenotrophomonas maltophilia NOT DETECTED NOT DETECTED Final   Candida albicans NOT DETECTED NOT DETECTED Final   Candida auris NOT DETECTED NOT DETECTED Final   Candida glabrata NOT DETECTED NOT DETECTED Final   Candida krusei NOT DETECTED NOT DETECTED Final   Candida parapsilosis NOT DETECTED NOT DETECTED Final   Candida tropicalis NOT DETECTED NOT DETECTED Final   Cryptococcus neoformans/gattii NOT DETECTED NOT DETECTED Final   Methicillin resistance mecA/C NOT DETECTED NOT DETECTED Final    Comment: Performed at [facility], [address], [place], [place] [address]  Resp Panel by RT-PCR (Flu A&B, Covid) Nasopharyngeal Swab     Status: None   Collection Time: 09/11/21 10:45 AM   Specimen: Nasopharyngeal Swab; Nasopharyngeal(NP) swabs in vial transport medium  Result Value Ref Range Status   SARS Coronavirus 2 by RT PCR NEGATIVE NEGATIVE Final    Comment: (NOTE) SARS-CoV-2 target nucleic acids are NOT DETECTED.  The SARS-CoV-2 RNA is generally detectable in upper respiratory specimens during the acute phase of infection. The lowest concentration of SARS-CoV-2 viral copies this assay can detect is 138 copies/mL. A negative result does not preclude SARS-Cov-2 infection and should not be used as the sole basis for treatment or other patient management decisions. A negative result may occur with  improper specimen collection/handling, submission of specimen other than nasopharyngeal swab, presence of viral mutation(s) within the areas targeted by this assay, and inadequate number of viral copies(<138 copies/mL). A negative result must be combined with clinical observations, patient history, and epidemiological information. The expected result is Negative.  Fact Sheet for Patients:  EntrepreneurPulse.com.au  Fact Sheet for Healthcare Providers:   IncredibleEmployment.be  This test is no t yet approved or cleared by the [place] FDA and  has been authorized for detection and/or diagnosis of SARS-CoV-2 by FDA under an Emergency Use Authorization (EUA). This EUA will remain  in effect (meaning this test can be used) for the duration of the COVID-19 declaration under Section 564(b)(1) of the Act, 21 U.S.C.section 360bbb-3(b)(1), unless the authorization is terminated  or revoked sooner.       Influenza A by PCR NEGATIVE NEGATIVE Final   Influenza B by PCR NEGATIVE NEGATIVE Final    Comment: (NOTE) The Xpert Xpress SARS-CoV-2/FLU/RSV plus assay is intended as an aid in the diagnosis of influenza from Nasopharyngeal swab specimens and should not be used as a sole basis for treatment. Nasal washings and aspirates are unacceptable for Xpert Xpress SARS-CoV-2/FLU/RSV testing.  Fact Sheet for Patients: EntrepreneurPulse.com.au  Fact Sheet for Healthcare Providers: IncredibleEmployment.be  This test is not yet approved or cleared by the  [place] [employer] and has been authorized for detection and/or diagnosis of SARS-CoV-2 by FDA under an TEFL teacher (EUA). This EUA will remain in effect (meaning this test can be used) for the duration of the COVID-19 declaration under Section 564(b)(1) of the Act, 21 U.S.C. section 360bbb-3(b)(1), unless the authorization is terminated or revoked.  Performed at [facility], [address], [place], [place] [address]   Blood culture (routine x 2)     Status: Abnormal   Collection Time: 09/11/21  3:52 PM   Specimen: BLOOD RIGHT HAND  Result Value Ref Range Status   Specimen Description   Final    BLOOD RIGHT HAND Performed at [facility], [address] [place], [place] [address]    Special Requests   Final    BOTTLES DRAWN AEROBIC AND ANAEROBIC Blood Culture adequate  volume Performed at [facility], [address] [place], [place] [address]    Culture  Setup Time   Final    GRAM POSITIVE COCCI IN CLUSTERS ANAEROBIC BOTTLE ONLY CRITICAL RESULT CALLED TO, READ BACK BY AND VERIFIED WITH: Damaris Hippo PHARMD, AT 0865 09/13/21 D. VANHOOK    Culture (A)  Final    STAPHYLOCOCCUS HOMINIS THE SIGNIFICANCE OF ISOLATING THIS ORGANISM FROM A SINGLE SET OF BLOOD CULTURES WHEN MULTIPLE SETS ARE DRAWN IS UNCERTAIN. PLEASE NOTIFY THE MICROBIOLOGY DEPARTMENT WITHIN ONE WEEK IF SPECIATION AND SENSITIVITIES ARE REQUIRED. Performed at [facility], [address] [place], [place] [address]    Report Status 09/15/2021 FINAL  Final  Culture, blood (Routine X 2) w Reflex to ID Panel     Status: None (Preliminary result)   Collection Time: 09/11/21  5:14 PM   Specimen: BLOOD  Result Value Ref Range Status   Specimen Description   Final    BLOOD BLOOD LEFT HAND Performed at [facility], [address] [place], [place] [address]    Special Requests   Final    BOTTLES DRAWN AEROBIC ONLY Blood Culture results may not be optimal due to an inadequate volume of blood received in culture bottles Performed at [facility], [address] [place], [place] [address]    Culture   Final    NO GROWTH 4 DAYS Performed at [facility], [address] [place], [place] [address]    Report Status PENDING  Incomplete  Culture, blood (Routine X 2) w Reflex to ID Panel     Status: None (Preliminary result)   Collection Time: 09/14/21 12:40 PM   Specimen: BLOOD  Result Value Ref Range Status   Specimen Description   Final    BLOOD RIGHT ANTECUBITAL Performed at [facility], [address], [place], [place] [address]    Special Requests   Final    BOTTLES DRAWN AEROBIC ONLY Blood Culture adequate volume Performed at [facility], [address] [place], [place] [address]     Culture   Final    NO GROWTH 3 DAYS Performed at [facility], [address] [place], [place] [address]    Report Status PENDING  Incomplete  Culture, blood (Routine X 2) w Reflex to ID Panel     Status: None (Preliminary result)   Collection Time: 09/14/21 12:40 PM   Specimen: BLOOD  Result Value Ref Range Status   Specimen Description   Final    BLOOD BLOOD LEFT HAND Performed at [facility], [place] [address], [place], [place] [address]    Special Requests   Final    BOTTLES DRAWN AEROBIC ONLY Blood Culture adequate volume Performed at [place] [address], [place], [place] [address]    Culture   Final    NO GROWTH 3 DAYS Performed at [facility], [place] [address] [place], [place] [address]    Report Status PENDING  Incomplete    Labs: Results for orders placed or performed during the hospital encounter of 09/11/21 (from the past 48 hour(s))  Hemoglobin A1c     Status: None   Collection Time: 09/16/21  8:50 AM  Result Value Ref Range   Hgb A1c MFr Bld 5.4 4.8 - 5.6 %    Comment: (NOTE) Pre diabetes:          5.7%-6.4%  Diabetes:              >6.4%  Glycemic control for   <7.0% adults with diabetes    Mean Plasma Glucose 108.28 mg/dL    Comment: Performed at [facility], [place] [address], [place], [place] [address]  Prealbumin     Status: Abnormal   Collection Time: 09/16/21  8:50 AM  Result Value Ref Range   Prealbumin 9.7 (L) 18 - 38 mg/dL    Comment: Performed at [facility] [address] [place], [place] [address]  Phosphorus     Status: None   Collection Time: 09/16/21  8:50 AM  Result Value Ref Range   Phosphorus 2.9 2.5 - 4.6 mg/dL    Comment: Performed at [facility], [place] [address], [place], [place] [address]  Magnesium     Status: None   Collection Time: 09/16/21  8:50 AM  Result Value Ref Range   Magnesium 1.9 1.7 - 2.4 mg/dL    Comment: Performed at  [facility], [place] [address] [place], [place] [id number]  Basic metabolic panel     Status: Abnormal   Collection Time: 09/16/21 12:56 PM  Result Value Ref Range   Sodium 136 135 - 145 mmol/L   Potassium 3.9 3.5 - 5.1 mmol/L   Chloride 102 98 - 111 mmol/L   CO2 25 22 - 32 mmol/L   Glucose, Bld 106 (H) 70 - 99 mg/dL    Comment: Glucose reference range applies only to samples taken after fasting for at least 8 hours.   BUN <5 (L) 6 - 20 mg/dL   Creatinine, Ser 0.77 0.44 - 1.00 mg/dL   Calcium 8.7 (L) 8.9 - 10.3 mg/dL   GFR, Estimated >60 >60 mL/min    Comment: (NOTE) Calculated using the CKD-EPI Creatinine Equation (2021)    Anion gap 9 5 - 15    Comment: Performed at [facility], [place] [address], [place],  [id number]  Basic metabolic panel     Status: Abnormal   Collection Time: 09/17/21  5:22 AM  Result Value Ref Range   Sodium 136 135 - 145 mmol/L   Potassium 3.6 3.5 - 5.1 mmol/L   Chloride 105 98 - 111 mmol/L   CO2 24 22 - 32 mmol/L   Glucose, Bld 91 70 - 99 mg/dL    Comment: Glucose reference range applies only to samples taken after fasting for at least 8 hours.   BUN <5 (L) 6 - 20 mg/dL   Creatinine, Ser 0.60 0.44 - 1.00 mg/dL   Calcium 8.9 8.9 - 10.3  mg/dL   GFR, Estimated >60 >60 mL/min    Comment: (NOTE) Calculated using the CKD-EPI Creatinine Equation (2021)    Anion gap 7 5 - 15    Comment: Performed at [facility], [place] [address], [place], [place] [address]  CBC with Differential/Platelet     Status: Abnormal   Collection Time: 09/17/21  5:22 AM  Result Value Ref Range   WBC 8.6 4.0 - 10.5 K/uL   RBC 4.32 3.87 - 5.11 MIL/uL   Hemoglobin 13.6 12.0 - 15.0 g/dL   HCT 41.4 36.0 - 46.0 %   MCV 95.8 80.0 - 100.0 fL   MCH 31.5 26.0 - 34.0 pg   MCHC 32.9 30.0 - 36.0 g/dL   RDW 12.2 11.5 - 15.5 %   Platelets 193 150 - 400 K/uL   nRBC 0.0 0.0 - 0.2 %   Neutrophils Relative % 58 %   Neutro Abs 5.1 1.7 -  7.7 K/uL   Lymphocytes Relative 26 %   Lymphs Abs 2.2 0.7 - 4.0 K/uL   Monocytes Relative 10 %   Monocytes Absolute 0.8 0.1 - 1.0 K/uL   Eosinophils Relative 3 %   Eosinophils Absolute 0.2 0.0 - 0.5 K/uL   Basophils Relative 1 %   Basophils Absolute 0.1 0.0 - 0.1 K/uL   WBC Morphology MILD LEFT SHIFT (1-5% METAS, OCC MYELO, OCC BANDS)    Immature Granulocytes 2 %   Abs Immature Granulocytes 0.15 (H) 0.00 - 0.07 K/uL   Reactive, Benign Lymphocytes PRESENT     Comment: Performed at [facility], [place] [address] [place], [place] [address]  TSH     Status: Abnormal   Collection Time: 09/18/21  5:39 AM  Result Value Ref Range   TSH 15.468 (H) 0.350 - 4.500 uIU/mL    Comment: Performed by a 3rd Generation assay with a functional sensitivity of <=0.01 uIU/mL. Performed at [facility], [place] [address], [place], [place] [id number]   Basic metabolic panel     Status: Abnormal   Collection Time: 09/18/21  5:39 AM  Result Value Ref Range   Sodium 135 135 - 145 mmol/L   Potassium 3.8 3.5 - 5.1 mmol/L   Chloride 103 98 - 111 mmol/L   CO2 23 22 - 32 mmol/L   Glucose, Bld 131 (H) 70 - 99 mg/dL    Comment: Glucose reference range applies only to samples taken after fasting for at least 8 hours.   BUN <5 (L) 6 - 20 mg/dL   Creatinine, Ser 0.71 0.44 - 1.00 mg/dL   Calcium 8.5 (L) 8.9 - 10.3 mg/dL   GFR, Estimated >60 >60 mL/min    Comment: (NOTE) Calculated using the CKD-EPI Creatinine Equation (2021)    Anion gap 9 5 - 15    Comment: Performed at [facility], [place] [address] [place],  [address]  CBC     Status: Abnormal   Collection Time: 09/18/21  5:39 AM  Result Value Ref Range   WBC 19.0 (H) 4.0 - 10.5 K/uL   RBC 4.86 3.87 - 5.11 MIL/uL   Hemoglobin 15.4 (H) 12.0 - 15.0 g/dL   HCT 46.0 36.0 - 46.0 %   MCV 94.7 80.0 - 100.0 fL   MCH 31.7 26.0 - 34.0 pg   MCHC 33.5 30.0 - 36.0 g/dL   RDW 12.3 11.5 - 15.5 %   Platelets  249 150 - 400 K/uL   nRBC 0.0 0.0 -  0.2 %    Comment: Performed at [facility], [place] [address] [place], [place] [address]  Magnesium     Status: Abnormal   Collection Time: 09/18/21  5:39 AM  Result Value Ref Range   Magnesium 1.5 (L) 1.7 - 2.4 mg/dL    Comment: Performed at [facility], [place] [address] [place], [place] [address]    Imaging / Studies: No results found.  Medications / Allergies: per chart  Antibiotics: Anti-infectives (From admission, onward)    Start     Dose/Rate Route Frequency Ordered Stop   09/18/21 1400  anidulafungin (ERAXIS) 100 mg in sodium chloride 0.9 % 100 mL IVPB        100 mg 78 mL/hr over 100 Minutes Intravenous Every 24 hours 09/17/21 2117 09/23/21 1359   09/18/21 0000  piperacillin-tazobactam (ZOSYN) IVPB 3.375 g        3.375 g 12.5 mL/hr over 240 Minutes Intravenous Every 8 hours 09/17/21 2117 09/22/21 2359   09/17/21 1830  clindamycin (CLEOCIN) 900 mg, gentamicin (GARAMYCIN) 240 mg in sodium chloride 0.9 % 1,000 mL for intraperitoneal lavage  Status:  Discontinued          As needed 09/17/21 1850 09/17/21 2117   09/17/21 1500  clindamycin (CLEOCIN) 900 mg, gentamicin (GARAMYCIN) 240 mg in sodium chloride 0.9 % 1,000 mL for intraperitoneal lavage  Status:  Discontinued       Note to Pharmacy: Have in the  [facility]orter room for final irrigation in bowel surgery case to minimize risk of abscess/infection Pharmacy may adjust dosing strength, schedule, rate of infusion, etc as needed to optimize therapy    Irrigation To Surgery 09/17/21 1448 09/17/21 2117   09/17/21 1400  anidulafungin (ERAXIS) 100 mg in sodium chloride 0.9 % 100 mL IVPB  Status:  Discontinued        100 mg 78 mL/hr over 100 Minutes Intravenous Every 24 hours 09/16/21 1352 09/17/21 2117   09/17/21 0600  cefoTEtan (CEFOTAN) 2 g in sodium chloride 0.9 % 100 mL IVPB        2 g 200 mL/hr over 30 Minutes Intravenous On call to O.R. 09/16/21 0835 09/17/21 1513    09/16/21 1500  anidulafungin (ERAXIS) 200 mg in sodium chloride 0.9 % 200 mL IVPB        200 mg 78 mL/hr over 200 Minutes Intravenous  Once 09/16/21 1400 09/16/21 2338   09/16/21 1400  neomycin (MYCIFRADIN) tablet 1,000 mg  Status:  Discontinued       See Hyperspace for full Linked Orders Report.   1,000 mg Oral 3 times per day 09/16/21 0835 09/16/21 1107   09/16/21 1400  metroNIDAZOLE (FLAGYL) tablet 1,000 mg       See Hyperspace for full Linked Orders Report.   1,000 mg Oral 3 times per day 09/16/21 0835 09/16/21 2008   09/16/21 1247  neomycin (MYCIFRADIN) tablet 500 mg  Status:  Discontinued        500 mg Oral As directed 09/16/21 1247 09/16/21 1254   09/11/21 1600  piperacillin-tazobactam (ZOSYN) IVPB 3.375 g  Status:  Discontinued        3.375 g 12.5 mL/hr over 240 Minutes Intravenous Every 8 hours 09/11/21 1538 09/17/21 2117   09/11/21 0645  piperacillin-tazobactam (ZOSYN) IVPB 3.375 g        3.375 g 100 mL/hr over 30 Minutes Intravenous  Once 09/11/21 [id number] 09/11/21 0729         Note: Portions of this report  may have been transcribed using voice recognition software. Every effort was made to ensure accuracy; however, inadvertent computerized transcription errors may be present.   Any transcriptional errors that result from this process are unintentional.    [name], MD, FACS, MASCRS Esophageal, Gastrointestinal & Colorectal Surgery Robotic and Minimally Invasive Surgery  [facility], [place] [place], [place] [address] [phone] Fax [phone] Main  CONTACT INFORMATION:  Weekday (9AM-5PM): Call [facility] main office at [phone]  Weeknight (5PM-9AM) or Weekend/Holiday: Check [email or web address] (password " [email or web address]") for General Surgery [facility] coverage  (Please, do not use SecureChat as it is not reliable communication to operating surgeons for immediate patient care)       09/18/2021  7:31 AM

## 2021-09-19 LAB — CBC WITH DIFFERENTIAL/PLATELET
Abs Immature Granulocytes: 0.17 10*3/uL — ABNORMAL HIGH (ref 0.00–0.07)
Basophils Absolute: 0.1 10*3/uL (ref 0.0–0.1)
Basophils Relative: 0 %
Eosinophils Absolute: 0.1 10*3/uL (ref 0.0–0.5)
Eosinophils Relative: 1 %
HCT: 45.5 % (ref 36.0–46.0)
Hemoglobin: 15.1 g/dL — ABNORMAL HIGH (ref 12.0–15.0)
Immature Granulocytes: 1 %
Lymphocytes Relative: 15 %
Lymphs Abs: 2.2 10*3/uL (ref 0.7–4.0)
MCH: 32.1 pg (ref 26.0–34.0)
MCHC: 33.2 g/dL (ref 30.0–36.0)
MCV: 96.8 fL (ref 80.0–100.0)
Monocytes Absolute: 0.8 10*3/uL (ref 0.1–1.0)
Monocytes Relative: 6 %
Neutro Abs: 11.5 10*3/uL — ABNORMAL HIGH (ref 1.7–7.7)
Neutrophils Relative %: 77 %
Platelets: 251 10*3/uL (ref 150–400)
RBC: 4.7 MIL/uL (ref 3.87–5.11)
RDW: 12.4 % (ref 11.5–15.5)
WBC: 14.8 10*3/uL — ABNORMAL HIGH (ref 4.0–10.5)
nRBC: 0 % (ref 0.0–0.2)

## 2021-09-19 LAB — BASIC METABOLIC PANEL
Anion gap: 10 (ref 5–15)
BUN: <5 mg/dL — ABNORMAL LOW (ref 6–20)
CO2: 21 mmol/L — ABNORMAL LOW (ref 22–32)
Calcium: 8.4 mg/dL — ABNORMAL LOW (ref 8.9–10.3)
Chloride: 101 mmol/L (ref 98–111)
Creatinine, Ser: 0.76 mg/dL (ref 0.44–1.00)
GFR, Estimated: >60 mL/min (ref >60)
Glucose, Bld: 128 mg/dL — ABNORMAL HIGH (ref 70–99)
Potassium: 4 mmol/L (ref 3.5–5.1)
Sodium: 132 mmol/L — ABNORMAL LOW (ref 135–145)

## 2021-09-19 LAB — CULTURE, BLOOD (ROUTINE X 2)
Culture: NO GROWTH
Culture: NO GROWTH
Special Requests: ADEQUATE
Special Requests: ADEQUATE

## 2021-09-19 LAB — MAGNESIUM: Magnesium: 2 mg/dL (ref 1.7–2.4)

## 2021-09-19 MED ORDER — CYCLOBENZAPRINE HCL 10 MG PO TABS
10.0000 mg | ORAL_TABLET | Freq: Three times a day (TID) | ORAL | Status: AC
  Administered 2021-09-19 – 2021-09-23 (×15): 10 mg via ORAL
  Filled 2021-09-19 (×15): qty 1

## 2021-09-19 MED ORDER — POTASSIUM CHLORIDE 20 MEQ PO PACK
40.0000 meq | PACK | Freq: Every day | ORAL | Status: AC
  Administered 2021-09-19 – 2021-09-21 (×3): 40 meq via ORAL
  Filled 2021-09-19 (×3): qty 2

## 2021-09-19 MED ORDER — GABAPENTIN 300 MG PO CAPS
300.0000 mg | ORAL_CAPSULE | Freq: Three times a day (TID) | ORAL | Status: DC
  Administered 2021-09-19 (×2): 300 mg via ORAL
  Filled 2021-09-19 (×2): qty 1

## 2021-09-19 MED ORDER — ENOXAPARIN SODIUM 40 MG/0.4ML IJ SOSY
40.0000 mg | PREFILLED_SYRINGE | Freq: Two times a day (BID) | INTRAMUSCULAR | Status: DC
  Administered 2021-09-19 – 2021-09-24 (×10): 40 mg via SUBCUTANEOUS
  Filled 2021-09-19 (×10): qty 0.4

## 2021-09-19 NOTE — Progress Notes (Signed)
Lori Mcknight [id number] May 15, 1986  CARE TEAM:  PCP: Claiborne Rigg, NP  Outpatient Care Team: Patient Care Team: Claiborne Rigg, NP as PCP - General (Nurse Practitioner) Allie Bossier, MD (Inactive) as Consulting Physician (Obstetrics and Gynecology) Karie Soda, MD as Consulting Physician (Colon and Rectal Surgery) Ccs, Md, MD as Consulting Physician (General Surgery) Despina Arias, MD as Consulting Physician (Urology)  Inpatient Treatment Team: Treatment Team: Attending Provider: Rodolph Bong, MD; Consulting Physician: Montez Morita, Md, MD; Consulting Physician: Karie Soda, MD; Social Worker: Desmond Lope; WOC Nurse: Teressa Lower [employer], RN; Consulting Physician: Despina Arias, MD; Rounding Team: Lilyan Gilford, MD; Licensed Practical Nurse: Marica Otter, LPN; Occupational Therapist: Theodoro Clock, OT; Physical Therapist: Casimiro Needle, PT   Problem List:   Principal Problem:   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 Active Problems:   Obesity, morbid, BMI 50 or higher (HCC)   Family history of colon cancer in father   Essential hypertension   Hypothyroid   Anemia   Endometriosis s/p ablation   Hypokalemia   Asthma, mild intermittent   Hypomagnesemia   Fecal peritonitis (HCC)   Intra-abdominal abscess s/p robotic drainage 09/18/2021   Colostomy in place [facility])   2 Days Post-Op  09/17/2021  POST-OPERATIVE DIAGNOSIS:   Perforated sigmoid colon most likely due to diverticulitis  Feculent peritonitis Abscesses x 3   PROCEDURE:   XI ROBOT ASSISTED SIGMOID COLECTOMY WITH END COLOSTOMY (HARTMANN) MOBILIZATION OF SPLENIC FLEXURE OF COLON DRAINAGE OF ABSCESSES X 3 ROBOTIC LYSIS OF ADHESIONS TRANSVERSUS ABDOMINIS PLANE (TAP) BLOCK - BILATERAL   SURGEON:  Ardeth Sportsman, MD   OR FINDINGS:    Patient had large phlegmon of old mentum and mid ileum adherent to a perforated sigmoid colon.  Interloop abscess with  feculent peritonitis.  Left adnexa Fallopian tube very dilated with purulence consistent with secondary tubo-ovarian abscess.  Very dense adhesions to the left posterolateral bladder dome with moderate abscess and purulence there as well.  All these 3 abscesses were drained.  Bladder distended and no evidence of any active colovesical fistula.  No evidence of any appendicitis.  Some dilatation consistent with probable partial small bowel obstruction at the site of the small bowel trying to contain the perforated colon with feculent peritonitis.  Therefore no anastomosis done.  End Hartmann resection.  No obvious metastatic disease on visceral parietal peritoneum or liver.   CASE DATA:   Type of patient?: LDOW CASE (Surgical Hospitalist [facility] Inpatient)   Status of Case? URGENT Add On   Infection Present At Time Of Surgery (PATOS)?   Feculent peritonitis, phlegmon, abscesses x3.      Assessment  Stable  [facility] Stay = 8 days)  Plan:  -IV ABx x 5d postop (Zosyn.  I added Eraxis given chronicity and persistent abscess for almost 2 years)  -ERAS protocol.  -Adv diet gradually  -f/u pathology - favor diverticulitis  -colostomy care and teaching  -Replace electrolytes.  Scheduled potassium given borderline low.  Hypomagnesemia rated..  -VTE prophylaxis- SCDs.  Enoxaparin OK - adjust dose per pharmacy given BMI 58  -I saw no strong evidence of polycystic ovarian syndrome interoperatively nor prior ultrasounds but will defer to medicine and gynecology if that truly needs to be treated  -mobilize as tolerated to help recovery.  She still seems to just want to be in the room.  Need to get her up moving around more to help minimize ileus and pulmonary issues.  Consulting therapies for evaluation to see if benefit of home health or equipment issues  -Moderate malnutrition given her prolonged diverticulitis and chronic abscess.  Supplemental shakes as tolerated.  Hopefully should  resolve.  -Hopefully can do second surgery in 3-6 months once this episode has resolved finally.  We will have to see how she does in the first 6 weeks with the surgery  -No evidence of colovesical fistula despite abscess on bladder.  Can remove per protocol POD#1.  I&O cath as needed  Disposition: Per primary service.  Anticipate discharge next week with home health for colostomy.  Her financial and other issues make her recovery challenging.       40 minutes spent in review, evaluation, examination, counseling, and coordination of care.   I have reviewed this patient's available data, including medical history, events of note, physical examination and test results as part of my evaluation.  A significant portion of that time was spent in counseling.  Care during the described time interval was provided by me.  09/19/2021    Subjective: (Chief complaint)  Patient tolerating liquids.  For to walk in room.  Not getting in hallways.  Likes morphine and tramadol together.  Objective:  Vital signs:  Vitals:   09/18/21 2035 09/19/21 0000 09/19/21 0500 09/19/21 0516  BP: (!) 147/90 132/90  (!) 152/113  Pulse: (!) 105 (!) 103  97  Resp: 18 20  20   Temp: 98.7 F (37.1 C)   99 F (37.2 C)  TempSrc: Oral   Oral  SpO2: 98%   98%  Weight:   (!) 174.7 kg   Height:        Last BM Date: 09/16/21  Intake/Output   Yesterday:  12/07 0701 - 12/08 0700 In: 615.8 [I.V.:429.7; IV Piggyback:186] Out: 1300 [Urine:1100; Drains:150; Stool:50] This shift:  No intake/output data recorded.  Bowel function:  Flatus: No  BM:  YES  Drain: Serosanguinous   Physical Exam:  General: Pt awake/alert in no acute distress Eyes: PERRL, normal EOM.  Sclera clear.  No icterus Neuro: CN II-XII intact w/o focal sensory/motor deficits. Lymph: No head/neck/groin lymphadenopathy Psych:  No delerium/psychosis/paranoia.  Oriented x 4 HENT: Normocephalic, Mucus membranes moist.  No thrush Neck:  Supple, No tracheal deviation.  No obvious thyromegaly Chest: No pain to chest wall compression.  Good respiratory excursion.  No audible wheezing CV:  Pulses intact.  Regular rhythm.  No major extremity edema MS: Normal AROM mjr joints.  No obvious deformity  Abdomen: Morbidly obese Soft.  Nondistended.  Mildly tender at incisions only.  No evidence of peritonitis.  No incarcerated hernias. Left-sided colostomy pink.  No major gas.  Scant stool in bag  Ext:   No deformity.  No mjr edema.  No cyanosis Skin: No petechiae / purpurea.  No major sores.  Warm and dry    Results:   Cultures: Recent Results (from the past 720 hour(s))  Resp Panel by RT-PCR (Flu A&B, Covid) Nasopharyngeal Swab     Status: None   Collection Time: 08/28/21  1:54 PM   Specimen: Nasopharyngeal Swab; Nasopharyngeal(NP) swabs in vial transport medium  Result Value Ref Range Status   SARS Coronavirus 2 by RT PCR NEGATIVE NEGATIVE Final    Comment: (NOTE) SARS-CoV-2 target nucleic acids are NOT DETECTED.  The SARS-CoV-2 RNA is generally detectable in upper respiratory specimens during the acute phase of infection. The lowest concentration of SARS-CoV-2 viral copies this assay can detect is 138 copies/mL. A negative result does  not preclude SARS-Cov-2 infection and should not be used as the sole basis for treatment or other patient management decisions. A negative result may occur with  improper specimen collection/handling, submission of specimen other than nasopharyngeal swab, presence of viral mutation(s) within the areas targeted by this assay, and inadequate number of viral copies(<138 copies/mL). A negative result must be combined with clinical observations, patient history, and epidemiological information. The expected result is Negative.  Fact Sheet for Patients:  EntrepreneurPulse.com.au  Fact Sheet for Healthcare Providers:  IncredibleEmployment.be  This test is  no t yet approved or cleared by the [place] FDA and  has been authorized for detection and/or diagnosis of SARS-CoV-2 by FDA under an Emergency Use Authorization (EUA). This EUA will remain  in effect (meaning this test can be used) for the duration of the COVID-19 declaration under Section 564(b)(1) of the Act, 21 U.S.C.section 360bbb-3(b)(1), unless the authorization is terminated  or revoked sooner.       Influenza A by PCR NEGATIVE NEGATIVE Final   Influenza B by PCR NEGATIVE NEGATIVE Final    Comment: (NOTE) The Xpert Xpress SARS-CoV-2/FLU/RSV plus assay is intended as an aid in the diagnosis of influenza from Nasopharyngeal swab specimens and should not be used as a sole basis for treatment. Nasal washings and aspirates are unacceptable for Xpert Xpress SARS-CoV-2/FLU/RSV testing.  Fact Sheet for Patients: EntrepreneurPulse.com.au  Fact Sheet for Healthcare Providers: IncredibleEmployment.be  This test is not yet approved or cleared by the [place] FDA and has been authorized for detection and/or diagnosis of SARS-CoV-2 by FDA under an Emergency Use Authorization (EUA). This EUA will remain in effect (meaning this test can be used) for the duration of the COVID-19 declaration under Section 564(b)(1) of the Act, 21 U.S.C. section 360bbb-3(b)(1), unless the authorization is terminated or revoked.  Performed at [facility], [address], [place], [place] [address]   Blood culture (routine x 2)     Status: Abnormal   Collection Time: 09/11/21  6:42 AM   Specimen: BLOOD  Result Value Ref Range Status   Specimen Description   Final    BLOOD BLOOD LEFT FOREARM Performed at [facility], [address] [place], [place] [address]    Special Requests   Final    Blood Culture adequate volume BOTTLES DRAWN AEROBIC AND ANAEROBIC Performed at [facility], [place], [place] [address]    Culture  Setup Time   Final    GRAM POSITIVE COCCI IN BOTH AEROBIC AND ANAEROBIC BOTTLES CRITICAL RESULT CALLED TO, READ BACK BY AND VERIFIED WITH: PHARMD K.SHADE AT 1213 ON 09/12/2021 BY T.SAAD.    Culture (A)  Final    STAPHYLOCOCCUS EPIDERMIDIS THE SIGNIFICANCE OF ISOLATING THIS ORGANISM FROM A SINGLE VENIPUNCTURE CANNOT BE PREDICTED WITHOUT FURTHER CLINICAL AND CULTURE CORRELATION. SUSCEPTIBILITIES AVAILABLE ONLY ON REQUEST. Performed at [facility], [place] [address] [place], [place] [address]    Report Status 09/14/2021 FINAL  Final  Blood Culture ID Panel (Reflexed)     Status: Abnormal   Collection Time: 09/11/21  6:42 AM  Result Value Ref Range Status   Enterococcus faecalis NOT DETECTED NOT DETECTED Final   Enterococcus Faecium NOT DETECTED NOT DETECTED Final   Listeria monocytogenes NOT DETECTED NOT DETECTED Final   Staphylococcus species DETECTED (A) NOT DETECTED Final    Comment: CRITICAL RESULT CALLED TO, READ BACK BY AND VERIFIED WITH: PHARMD K.SHADE AT 1213 ON 09/12/2021 BY T.SAAD.  Staphylococcus aureus (BCID) NOT DETECTED NOT DETECTED Final   Staphylococcus epidermidis DETECTED (A) NOT DETECTED Final    Comment: CRITICAL RESULT CALLED TO, READ BACK BY AND VERIFIED WITH: PHARMD K.SHADE AT 1213 ON 09/12/2021 BY T.SAAD.    Staphylococcus lugdunensis NOT DETECTED NOT DETECTED Final   Streptococcus species NOT DETECTED NOT DETECTED Final   Streptococcus agalactiae NOT DETECTED NOT DETECTED Final   Streptococcus pneumoniae NOT DETECTED NOT DETECTED Final   Streptococcus pyogenes NOT DETECTED NOT DETECTED Final   A.calcoaceticus-baumannii NOT DETECTED NOT DETECTED Final   Bacteroides fragilis NOT DETECTED NOT DETECTED Final   Enterobacterales NOT DETECTED NOT DETECTED Final   Enterobacter cloacae complex NOT DETECTED NOT DETECTED Final   Escherichia coli NOT DETECTED NOT DETECTED Final   Klebsiella aerogenes NOT DETECTED NOT  DETECTED Final   Klebsiella oxytoca NOT DETECTED NOT DETECTED Final   Klebsiella pneumoniae NOT DETECTED NOT DETECTED Final   Proteus species NOT DETECTED NOT DETECTED Final   Salmonella species NOT DETECTED NOT DETECTED Final   Serratia marcescens NOT DETECTED NOT DETECTED Final   Haemophilus influenzae NOT DETECTED NOT DETECTED Final   Neisseria meningitidis NOT DETECTED NOT DETECTED Final   Pseudomonas aeruginosa NOT DETECTED NOT DETECTED Final   Stenotrophomonas maltophilia NOT DETECTED NOT DETECTED Final   Candida albicans NOT DETECTED NOT DETECTED Final   Candida auris NOT DETECTED NOT DETECTED Final   Candida glabrata NOT DETECTED NOT DETECTED Final   Candida krusei NOT DETECTED NOT DETECTED Final   Candida parapsilosis NOT DETECTED NOT DETECTED Final   Candida tropicalis NOT DETECTED NOT DETECTED Final   Cryptococcus neoformans/gattii NOT DETECTED NOT DETECTED Final   Methicillin resistance mecA/C NOT DETECTED NOT DETECTED Final    Comment: Performed at [facility], [address] [place], [place] [address]  Resp Panel by RT-PCR (Flu A&B, Covid) Nasopharyngeal Swab     Status: None   Collection Time: 09/11/21 10:45 AM   Specimen: Nasopharyngeal Swab; Nasopharyngeal(NP) swabs in vial transport medium  Result Value Ref Range Status   SARS Coronavirus 2 by RT PCR NEGATIVE NEGATIVE Final    Comment: (NOTE) SARS-CoV-2 target nucleic acids are NOT DETECTED.  The SARS-CoV-2 RNA is generally detectable in upper respiratory specimens during the acute phase of infection. The lowest concentration of SARS-CoV-2 viral copies this assay can detect is 138 copies/mL. A negative result does not preclude SARS-Cov-2 infection and should not be used as the sole basis for treatment or other patient management decisions. A negative result may occur with  improper specimen collection/handling, submission of specimen other than nasopharyngeal swab, presence of viral mutation(s) within  the areas targeted by this assay, and inadequate number of viral copies(<138 copies/mL). A negative result must be combined with clinical observations, patient history, and epidemiological information. The expected result is Negative.  Fact Sheet for Patients:  EntrepreneurPulse.com.au  Fact Sheet for Healthcare Providers:  IncredibleEmployment.be  This test is no t yet approved or cleared by the [place] FDA and  has been authorized for detection and/or diagnosis of SARS-CoV-2 by FDA under an Emergency Use Authorization (EUA). This EUA will remain  in effect (meaning this test can be used) for the duration of the COVID-19 declaration under Section 564(b)(1) of the Act, 21 U.S.C.section 360bbb-3(b)(1), unless the authorization is terminated  or revoked sooner.       Influenza A by PCR NEGATIVE NEGATIVE Final   Influenza B by PCR NEGATIVE NEGATIVE Final    Comment: (NOTE) The Xpert Xpress  SARS-CoV-2/FLU/RSV plus assay is intended as an aid in the diagnosis of influenza from Nasopharyngeal swab specimens and should not be used as a sole basis for treatment. Nasal washings and aspirates are unacceptable for Xpert Xpress SARS-CoV-2/FLU/RSV testing.  Fact Sheet for Patients: BloggerCourse.com  Fact Sheet for Healthcare Providers: SeriousBroker.it  This test is not yet approved or cleared by the [place] FDA and has been authorized for detection and/or diagnosis of SARS-CoV-2 by FDA under an Emergency Use Authorization (EUA). This EUA will remain in effect (meaning this test can be used) for the duration of the COVID-19 declaration under Section 564(b)(1) of the Act, 21 U.S.C. section 360bbb-3(b)(1), unless the authorization is terminated or revoked.  Performed at [facility], [address], [place], [place] [address]   Blood culture (routine x 2)     Status:  Abnormal   Collection Time: 09/11/21  3:52 PM   Specimen: BLOOD RIGHT HAND  Result Value Ref Range Status   Specimen Description   Final    BLOOD RIGHT HAND Performed at [facility], [address], [place], [place] [address]    Special Requests   Final    BOTTLES DRAWN AEROBIC AND ANAEROBIC Blood Culture adequate volume Performed at [facility], [address], [place], [place] [address]    Culture  Setup Time   Final    GRAM POSITIVE COCCI IN CLUSTERS ANAEROBIC BOTTLE ONLY CRITICAL RESULT CALLED TO, READ BACK BY AND VERIFIED WITH: Damaris Hippo PHARMD, AT 4696 09/13/21 D. VANHOOK    Culture (A)  Final    STAPHYLOCOCCUS HOMINIS THE SIGNIFICANCE OF ISOLATING THIS ORGANISM FROM A SINGLE SET OF BLOOD CULTURES WHEN MULTIPLE SETS ARE DRAWN IS UNCERTAIN. PLEASE NOTIFY THE MICROBIOLOGY DEPARTMENT WITHIN ONE WEEK IF SPECIATION AND SENSITIVITIES ARE REQUIRED. Performed at [facility], [address], [place], [place] [address]    Report Status 09/15/2021 FINAL  Final  Culture, blood (Routine X 2) w Reflex to ID Panel     Status: None   Collection Time: 09/11/21  5:14 PM   Specimen: BLOOD  Result Value Ref Range Status   Specimen Description   Final    BLOOD BLOOD LEFT HAND Performed at [facility], [address] [place], [place] [address]    Special Requests   Final    BOTTLES DRAWN AEROBIC ONLY Blood Culture results may not be optimal due to an inadequate volume of blood received in culture bottles Performed at [facility], [address], [place], [place] [address]    Culture   Final    NO GROWTH 5 DAYS Performed at [facility], [address] [place], [place] [address]    Report Status 09/17/2021 FINAL  Final  Culture, blood (Routine X 2) w Reflex to ID Panel     Status: None (Preliminary result)   Collection Time: 09/14/21 12:40 PM   Specimen: BLOOD  Result Value Ref Range Status    Specimen Description   Final    BLOOD RIGHT ANTECUBITAL Performed at [facility], [address] [place], [place] [address]    Special Requests   Final    BOTTLES DRAWN AEROBIC ONLY Blood Culture adequate volume Performed at [facility], [address] [place], [place] [address]    Culture   Final    NO GROWTH 4 DAYS Performed at [facility], [address] [place], [place] [address]  Report Status PENDING  Incomplete  Culture, blood (Routine X 2) w Reflex to ID Panel     Status: None (Preliminary result)   Collection Time: 09/14/21 12:40 PM   Specimen: BLOOD  Result Value Ref Range Status   Specimen Description   Final    BLOOD BLOOD LEFT HAND Performed at [place] [address] [place], [place] [address]    Special Requests   Final    BOTTLES DRAWN AEROBIC ONLY Blood Culture adequate volume Performed at [place] [facility] [address] [place], [place] [address]    Culture   Final    NO GROWTH 4 DAYS Performed at [facility], [place] [address], [place], [place] [address]    Report Status PENDING  Incomplete    Labs: Results for orders placed or performed during the hospital encounter of 09/11/21 (from the past 48 hour(s))  TSH     Status: Abnormal   Collection Time: 09/18/21  5:39 AM  Result Value Ref Range   TSH 15.468 (H) 0.350 - 4.500 uIU/mL    Comment: Performed by a 3rd Generation assay with a functional sensitivity of <=0.01 uIU/mL. Performed at [facility], [place] [address], [place], [place] [id number]   Basic metabolic panel     Status: Abnormal   Collection Time: 09/18/21  5:39 AM  Result Value Ref Range   Sodium 135 135 - 145 mmol/L   Potassium 3.8 3.5 - 5.1 mmol/L   Chloride 103 98 - 111 mmol/L   CO2 23 22 - 32 mmol/L   Glucose, Bld 131 (H) 70 - 99 mg/dL    Comment: Glucose reference range applies only to samples taken after fasting for at  least 8 hours.   BUN <5 (L) 6 - 20 mg/dL   Creatinine, Ser 0.71 0.44 - 1.00 mg/dL   Calcium 8.5 (L) 8.9 - 10.3 mg/dL   GFR, Estimated >60 >60 mL/min    Comment: (NOTE) Calculated using the CKD-EPI Creatinine Equation (2021)    Anion gap 9 5 - 15    Comment: Performed at [facility], [place] [address], [place], [place] [address]  CBC     Status: Abnormal   Collection Time: 09/18/21  5:39 AM  Result Value Ref Range   WBC 19.0 (H) 4.0 - 10.5 K/uL   RBC 4.86 3.87 - 5.11 MIL/uL   Hemoglobin 15.4 (H) 12.0 - 15.0 g/dL   HCT 46.0 36.0 - 46.0 %   MCV 94.7 80.0 - 100.0 fL   MCH 31.7 26.0 - 34.0 pg   MCHC 33.5 30.0 - 36.0 g/dL   RDW 12.3 11.5 - 15.5 %   Platelets 249 150 - 400 K/uL   nRBC 0.0 0.0 - 0.2 %    Comment: Performed at [facility], [place] [address] [place], [place] [address]  Magnesium     Status: Abnormal   Collection Time: 09/18/21  5:39 AM  Result Value Ref Range   Magnesium 1.5 (L) 1.7 - 2.4 mg/dL    Comment: Performed at [facility], [place] [address] [place],  [id number]  Basic metabolic panel     Status: Abnormal   Collection Time: 09/19/21  5:34 AM  Result Value Ref Range   Sodium 132 (L) 135 - 145 mmol/L   Potassium 4.0 3.5 - 5.1 mmol/L   Chloride 101 98 - 111 mmol/L   CO2 21 (L) 22 - 32 mmol/L  Glucose, Bld 128 (H) 70 - 99 mg/dL    Comment: Glucose reference range applies only to samples taken after fasting for at least 8 hours.   BUN <5 (L) 6 - 20 mg/dL   Creatinine, Ser 0.76 0.44 - 1.00 mg/dL   Calcium 8.4 (L) 8.9 - 10.3 mg/dL   GFR, Estimated >60 >60 mL/min    Comment: (NOTE) Calculated using the CKD-EPI Creatinine Equation (2021)    Anion gap 10 5 - 15    Comment: Performed at [facility], [place] [address] [place], [place] [address]  Magnesium     Status: None   Collection Time: 09/19/21  5:34 AM  Result Value Ref Range   Magnesium 2.0 1.7 - 2.4 mg/dL    Comment: Performed  at [facility], [place] [address], [place], [place] [address]  CBC with Differential/Platelet     Status: Abnormal   Collection Time: 09/19/21  5:34 AM  Result Value Ref Range   WBC 14.8 (H) 4.0 - 10.5 K/uL   RBC 4.70 3.87 - 5.11 MIL/uL   Hemoglobin 15.1 (H) 12.0 - 15.0 g/dL   HCT 45.5 36.0 - 46.0 %   MCV 96.8 80.0 - 100.0 fL   MCH 32.1 26.0 - 34.0 pg   MCHC 33.2 30.0 - 36.0 g/dL   RDW 12.4 11.5 - 15.5 %   Platelets 251 150 - 400 K/uL   nRBC 0.0 0.0 - 0.2 %   Neutrophils Relative % 77 %   Neutro Abs 11.5 (H) 1.7 - 7.7 K/uL   Lymphocytes Relative 15 %   Lymphs Abs 2.2 0.7 - 4.0 K/uL   Monocytes Relative 6 %   Monocytes Absolute 0.8 0.1 - 1.0 K/uL   Eosinophils Relative 1 %   Eosinophils Absolute 0.1 0.0 - 0.5 K/uL   Basophils Relative 0 %   Basophils Absolute 0.1 0.0 - 0.1 K/uL   Immature Granulocytes 1 %   Abs Immature Granulocytes 0.17 (H) 0.00 - 0.07 K/uL    Comment: Performed at [facility], [place] [address] [place], [place] [address]    Imaging / Studies: No results found.  Medications / Allergies: per chart  Antibiotics: Anti-infectives (From admission, onward)    Start     Dose/Rate Route Frequency Ordered Stop   09/18/21 1400  anidulafungin (ERAXIS) 100 mg in sodium chloride 0.9 % 100 mL IVPB        100 mg 78 mL/hr over 100 Minutes Intravenous Every 24 hours 09/17/21 2117 09/22/21 1359   09/18/21 0000  piperacillin-tazobactam (ZOSYN) IVPB 3.375 g        3.375 g 12.5 mL/hr over 240 Minutes Intravenous Every 8 hours 09/17/21 2117 09/22/21 2359   09/17/21 1830  clindamycin (CLEOCIN) 900 mg, gentamicin (GARAMYCIN) 240 mg in sodium chloride 0.9 % 1,000 mL for intraperitoneal lavage  Status:  Discontinued          As needed 09/17/21 1850 09/17/21 2117   09/17/21 1500  clindamycin (CLEOCIN) 900 mg, gentamicin (GARAMYCIN) 240 mg in sodium chloride 0.9 % 1,000 mL for intraperitoneal lavage  Status:  Discontinued       Note to Pharmacy:  Have in the  [place] room for final irrigation in bowel surgery case to minimize risk of abscess/infection Pharmacy may adjust dosing strength, schedule, rate of infusion, etc as needed to optimize therapy    Irrigation To Surgery 09/17/21 1448 09/17/21 2117   09/17/21 1400  anidulafungin (ERAXIS) 100 mg in sodium  chloride 0.9 % 100 mL IVPB  Status:  Discontinued        100 mg 78 mL/hr over 100 Minutes Intravenous Every 24 hours 09/16/21 1352 09/17/21 2117   09/17/21 0600  cefoTEtan (CEFOTAN) 2 g in sodium chloride 0.9 % 100 mL IVPB        2 g 200 mL/hr over 30 Minutes Intravenous On call to O.R. 09/16/21 0835 09/17/21 1513   09/16/21 1500  anidulafungin (ERAXIS) 200 mg in sodium chloride 0.9 % 200 mL IVPB        200 mg 78 mL/hr over 200 Minutes Intravenous  Once 09/16/21 1400 09/16/21 2338   09/16/21 1400  neomycin (MYCIFRADIN) tablet 1,000 mg  Status:  Discontinued       See Hyperspace for full Linked Orders Report.   1,000 mg Oral 3 times per day 09/16/21 0835 09/16/21 1107   09/16/21 1400  metroNIDAZOLE (FLAGYL) tablet 1,000 mg       See Hyperspace for full Linked Orders Report.   1,000 mg Oral 3 times per day 09/16/21 0835 09/16/21 2008   09/16/21 1247  neomycin (MYCIFRADIN) tablet 500 mg  Status:  Discontinued        500 mg Oral As directed 09/16/21 1247 09/16/21 1254   09/11/21 1600  piperacillin-tazobactam (ZOSYN) IVPB 3.375 g  Status:  Discontinued        3.375 g 12.5 mL/hr over 240 Minutes Intravenous Every 8 hours 09/11/21 1538 09/17/21 2117   09/11/21 0645  piperacillin-tazobactam (ZOSYN) IVPB 3.375 g        3.375 g 100 mL/hr over 30 Minutes Intravenous  Once 09/11/21 [id number] 09/11/21 0729         Note: Portions of this report may have been transcribed using voice recognition software. Every effort was made to ensure accuracy; however, inadvertent computerized transcription errors may be present.   Any transcriptional errors that result from this process are  unintentional.    [name], MD, FACS, MASCRS Esophageal, Gastrointestinal & Colorectal Surgery Robotic and Minimally Invasive Surgery  [facility], [place]  [place] [place], [place] [address] [phone] Fax [phone] Main  CONTACT INFORMATION:  Weekday (9AM-5PM): Call [facility] main office at [phone]  Weeknight (5PM-9AM) or Weekend/Holiday: Check [email or web address] (password " [email or web address]") for General Surgery [facility] coverage  (Please, do not use SecureChat as it is not reliable communication to operating surgeons for immediate patient care)      09/19/2021  7:09 AM

## 2021-09-19 NOTE — Evaluation (Signed)
Physical Therapy Evaluation Patient Details Name: Lori Mcknight MRN: [id number] DOB: [birth year] Today's Date: 09/19/2021  History of Present Illness  The patient is a 35 yr old woman who has had multiple recurrences of diverticulitis with abscess formation, depression anxiety, anemia, hypertension, hypothyroidism, PCOS, super morbid obesity presents this time with severe abdominal pain. Pt s/p robotic colectomy/colostomy  Clinical Impression  Pt presents with dependencies in mobility secondary to the above diagnosis. Pt is limited by pain. Pt ambulated in the hall 65 ft with min guard assist pushing the IV pole. Pt reports she will d/c to her sister's house. Feel pt will make steady progress and will be able to d/c home with family. Pt will continue to benefit from acute skilled PT to maximize mobility and independence for d/c.     Recommendations for follow up therapy are one component of a multi-disciplinary discharge planning process, led by the attending physician.  Recommendations may be updated based on patient status, additional functional criteria and insurance authorization.  Follow Up Recommendations No PT follow up    Assistance Recommended at Discharge Intermittent Supervision/Assistance  Functional Status Assessment Patient has had a recent decline in their functional status and demonstrates the ability to make significant improvements in function in a reasonable and predictable amount of time.  Equipment Recommendations  BSC/3in1    Recommendations for Other Services       Precautions / Restrictions Precautions Precautions: Other (comment) Precaution Comments: colostomy, drain Restrictions Weight Bearing Restrictions: No      Mobility  Bed Mobility Overal bed mobility: Needs Assistance Bed Mobility: Supine to Sit     Supine to sit: Min guard;HOB elevated     General bed mobility comments: cues for technique, rest breaks due to pain    Transfers Overall  transfer level: Needs assistance Equipment used: None Transfers: Sit to/from Stand Sit to Stand: Min guard;From elevated surface           General transfer comment: min guard assist sit-stand from commode with elevated seat    Ambulation/Gait Ambulation/Gait assistance: Min guard Gait Distance (Feet): 65 Feet Assistive device: IV Pole Gait Pattern/deviations: Step-through pattern;Decreased stride length;Trunk flexed Gait velocity: decreased        Stairs            Wheelchair Mobility    Modified Rankin (Stroke Patients Only)       Balance Overall balance assessment: No apparent balance deficits (not formally assessed)                                           Pertinent Vitals/Pain Pain Assessment: 0-10 Pain Score: 6  Breathing: normal Negative Vocalization: none Facial Expression: smiling or inexpressive Consolability: no need to console Pain Location: abdomen Pain Descriptors / Indicators: Discomfort;Grimacing;Pressure;Moaning Pain Intervention(s): Limited activity within patient's tolerance;Repositioned;Monitored during session;RN gave pain meds during session    Home Living Family/patient expects to be discharged to:: Private residence Living Arrangements: Other relatives (plans to d/c to sisters home.) Available Help at Discharge: Family Type of Home: House Home Access: Level entry     Alternate Level Stairs-Number of Steps: 1 flight Home Layout: Two level Home Equipment: None      Prior Function Prior Level of Function : Independent/Modified Independent                     Hand Dominance  Extremity/Trunk Assessment   Upper Extremity Assessment Upper Extremity Assessment: Defer to OT evaluation    Lower Extremity Assessment Lower Extremity Assessment: Overall WFL for tasks assessed    Cervical / Trunk Assessment Cervical / Trunk Assessment: Normal  Communication   Communication: No difficulties   Cognition Arousal/Alertness: Awake/alert   Overall Cognitive Status: Within Functional Limits for tasks assessed                                          General Comments General comments (skin integrity, edema, etc.): transfer to [facility], ambulated to bathrroom, spent lenghtly time tring to void, ambulated in the hall and back in the BR at the end of the session to try and void to relieve pressure.    Exercises     Assessment/Plan    PT Assessment Patient needs continued PT services  PT Problem List         PT Treatment Interventions DME instruction;Functional mobility training;Balance training;Gait training;Therapeutic activities;Neuromuscular re-education;Stair training    PT Goals (Current goals can be found in the Care Plan section)  Acute Rehab PT Goals Patient Stated Goal: To return home PT Goal Formulation: With patient Time For Goal Achievement: 10/03/21 Potential to Achieve Goals: Good    Frequency Min 3X/week   Barriers to discharge        Co-evaluation               AM-PAC PT "6 Clicks" Mobility  Outcome Measure Help needed turning from your back to your side while in a flat bed without using bedrails?: A Little Help needed moving from lying on your back to sitting on the side of a flat bed without using bedrails?: A Little Help needed moving to and from a bed to a chair (including a wheelchair)?: A Little Help needed standing up from a chair using your arms (e.g., wheelchair or bedside chair)?: A Little Help needed to walk in hospital room?: A Little Help needed climbing 3-5 steps with a railing? : A Lot 6 Click Score: 17    End of Session   Activity Tolerance: Patient tolerated treatment well Patient left: Other (comment) (BR-has call button) Nurse Communication: Mobility status PT Visit Diagnosis: Other abnormalities of gait and mobility (R26.89);Pain Pain - part of body:  (abdomen)    Time: 4008-6761 PT Time Calculation  (min) (ACUTE ONLY): 45 min   Charges:   PT Evaluation $PT Eval Moderate Complexity: 1 Mod PT Treatments $Gait Training: 8-22 mins $Therapeutic Activity: 8-22 mins        Greggory Stallion 09/19/2021, 11:29 AM

## 2021-09-19 NOTE — Progress Notes (Signed)
OT Cancellation Note  Patient Details Name: [place] C Canavan MRN: [id number] DOB: 06/17/1986   Cancelled Treatment:    Reason Eval/Treat Not Completed: Fatigue/lethargy limiting ability to participate: Pt asleep in recliner. Briefly opened eyes to state that her pain meds made her too sleepy to participate with OT. Pt unable to keep eyes open long. Will continue attempts as able.    Theodoro Clock 09/19/2021, 12:03 PM

## 2021-09-19 NOTE — Progress Notes (Signed)
PROGRESS NOTE    Lori Mcknight  TWS:[id number] DOB: 1986-01-18 DOA: 09/11/2021 PCP: Claiborne Rigg, NP    Chief Complaint  Patient presents with   Abdominal Pain    Brief Narrative:  The patient is a 35 yr old woman who has had multiple recurrences of diverticulitis with abscess formation, depression anxiety, anemia, hypertension, hypothyroidism, PCOS, super morbid obesity presents this time with severe abdominal pain.  CT of the abdomen and pelvis demonstrated increase in the size of the abscess.  She was started on IV Zosyn and general surgery consulted. General surgery recommended IV antibiotics and clear sips. In view of her worsening abscesses and pneumoperitoneum, she is scheduled for robotic assisted sigmoid colectomy with cystoscopy with firefly injection today.      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 Active Problems:   Essential hypertension   Hypothyroid   Anemia   Obesity, morbid, BMI 50 or higher (HCC)   Endometriosis s/p ablation   Family history of colon cancer in father   Hypokalemia   Asthma, mild intermittent   Hypomagnesemia   Fecal peritonitis (HCC)   Intra-abdominal abscess s/p robotic drainage 09/18/2021   Colostomy in place [facility])   #1 acute diverticulitis of the large intestine with abscess formation/perforated sigmoid colon likely secondary to diverticulitis/feculent peritonitis with abscesses x3 -Patient noted to have recurrent episodes of acute diverticulitis. -Dr. Gerri Lins discussed with general surgery, Dr. Cliffton Asters, noting that abscess had increased in size however remained too small for percutaneous drainage. -Due to patient's body habitus it was felt patient was high risk for complications and having drain being able to reach the skin. -General surgery following initially recommended treatment with IV antibiotics and watchful waiting. -No abscess sitting between sigmoid  colon and bladder likelihood of fistula formation allowing for natural drainage of abscess. -Patient's condition continued to worsen with worsening pneumoperitoneum and interval development of free fluid and gas along with known segment of mid sigmoid diverticulitis. -Patient being followed by general surgery and subsequently underwent robotic assisted sigmoid colectomy with cystoscopy with firefly injection 09/17/2021. -Continue empiric IV antibiotics, IV fluids, pain management. -IV Eraxis added per general surgery. -Diet advanced to a soft diet. -Mobilize. -Management per general surgery.  2.  Staph epidermis bacteremia -Likely contamination. -Repeat blood cultures negative.  3.  Morbid obesity -Lifestyle changes.   -Outpatient follow-up with PCP.    4.  Hypertension -Follow.  Some bouts of elevated blood pressure likely secondary to pain. -Monitor  5.  Hypothyroidism -Synthroid.   6.  Depression/anxiety -Not on any antidepressants on med rec. -Stable. -Outpatient follow-up.  7.  Hypomagnesemia -Magnesium at 2.0.   -Repeat labs in the AM.   8.  Leukocytosis -Patient with a worsening leukocytosis postoperatively, likely reactive in the setting of acute diverticulitis with perforation.  -Leukocytosis trending down. -Continue IV Zosyn, IV Eraxis. -Follow.  9.  History of PCOS -Per general surgery during surgery no significant findings concerning for PCOS. -Outpatient follow-up with PCP.  10.  Chest tightness -Resolved.  11.  Hypokalemia -Repleted.   DVT prophylaxis: Lovenox Code Status: Full Family Communication: Updated patient.  No family at bedside. Disposition:   Status is: Inpatient  Remains inpatient appropriate because: Severity of illness.       Consultants:  WOC RN: Cammie Mcgee, RN 09/18/2021 WOCN RN Lawson Fiscal McNicol's, RN 09/16/2021 General surgery: Dr. Cliffton Asters 09/11/2021  Procedures: Cystoscopy with bilateral ureteral catheterization and firefly  injection per Dr.Machen, urology  09/17/2021 Robotic assisted sigmoid colectomy with end colostomy/mobilization of splenic flexure of colon/drainage of abscesses x3/robotic lysis of adhesions/transversus abdominis plane block bilaterally per Dr. Michaell Cowing, general surgery 09/17/2021 CT abdomen and pelvis 09/11/2021, 09/14/2021 2D echo 09/14/2021   Antimicrobials:  IV Eraxis 09/17/2021>>>>> 09/22/2021 IV Zosyn 09/11/2021>>>>> 09/22/2021   Subjective: In the process of sitting up and working with therapy.  Complaining of some nausea.  Complain of some diffuse abdominal pain.  No chest pain.  No shortness of breath.  No flatus.  No bowel movement.    Objective: Vitals:   09/18/21 2035 09/19/21 0000 09/19/21 0500 09/19/21 0516  BP: (!) 147/90 132/90  (!) 152/113  Pulse: (!) 105 (!) 103  97  Resp: 18 20  20   Temp: 98.7 F (37.1 C)   99 F (37.2 C)  TempSrc: Oral   Oral  SpO2: 98%   98%  Weight:   (!) 174.7 kg   Height:        Intake/Output Summary (Last 24 hours) at 09/19/2021 1049 Last data filed at 09/19/2021 0600 Gross per 24 hour  Intake 615.75 ml  Output 200 ml  Net 415.75 ml    Filed Weights   09/11/21 0303 09/17/21 1256 09/19/21 0500  Weight: (!) 163.3 kg (!) 163.3 kg (!) 174.7 kg    Examination:  General exam: NAD Respiratory system: CTA B.  No wheezes, no crackles, no rhonchi.  Normal respiratory effort.   Cardiovascular system: Regular rate and rhythm no murmurs rubs or gallops.  No JVD.  No lower extremity edema.  Gastrointestinal system: Abdomen is soft, nondistended, diffusely tender to palpation around incision sites. Normal bowel sounds.  Colostomy with red stoma.  JP drain with sanguinous drainage noted in the right lower quadrant.  Central nervous system: Alert and oriented. No focal neurological deficits. Extremities: Symmetric 5 x 5 power. Skin: No rashes, lesions or ulcers Psychiatry: Judgement and insight appear normal. Mood & affect appropriate.     Data  Reviewed: I have personally reviewed following labs and imaging studies  CBC: Recent Labs  Lab 09/14/21 0748 09/15/21 0533 09/17/21 0522 09/18/21 0539 09/19/21 0534  WBC 14.1* 10.9* 8.6 19.0* 14.8*  NEUTROABS  --   --  5.1  --  11.5*  HGB 15.0 14.6 13.6 15.4* 15.1*  HCT 45.8 44.0 41.4 46.0 45.5  MCV 96.4 95.2 95.8 94.7 96.8  PLT 186 197 193 249 251     Basic Metabolic Panel: Recent Labs  Lab 09/15/21 0533 09/16/21 0850 09/16/21 1256 09/17/21 0522 09/18/21 0539 09/19/21 0534  NA 133*  --  136 136 135 132*  K 3.4*  --  3.9 3.6 3.8 4.0  CL 103  --  102 105 103 101  CO2 25  --  25 24 23  21*  GLUCOSE 85  --  106* 91 131* 128*  BUN 5*  --  <5* <5* <5* <5*  CREATININE 0.81  --  0.77 0.60 0.71 0.76  CALCIUM 8.4*  --  8.7* 8.9 8.5* 8.4*  MG  --  1.9  --   --  1.5* 2.0  PHOS  --  2.9  --   --   --   --      GFR: Estimated Creatinine Clearance: 161.9 mL/min (by C-G formula based on SCr of 0.76 mg/dL).  Liver Function Tests: No results for input(s): AST, ALT, ALKPHOS, BILITOT, PROT, ALBUMIN in the last 168 hours.  CBG: No results for input(s): GLUCAP in the last 168 hours.  Recent Results (from the past 240 hour(s))  Blood culture (routine x 2)     Status: Abnormal   Collection Time: 09/11/21  6:42 AM   Specimen: BLOOD  Result Value Ref Range Status   Specimen Description   Final    BLOOD BLOOD LEFT FOREARM Performed at [facility], [address] [place], [place] [address]    Special Requests   Final    Blood Culture adequate volume BOTTLES DRAWN AEROBIC AND ANAEROBIC Performed at [facility], [address] [place], [place] [address]    Culture  Setup Time   Final    GRAM POSITIVE COCCI IN BOTH AEROBIC AND ANAEROBIC BOTTLES CRITICAL RESULT CALLED TO, READ BACK BY AND VERIFIED WITH: PHARMD K.SHADE AT 1213 ON 09/12/2021 BY T.SAAD.    Culture (A)  Final    STAPHYLOCOCCUS EPIDERMIDIS THE SIGNIFICANCE OF ISOLATING  THIS ORGANISM FROM A SINGLE VENIPUNCTURE CANNOT BE PREDICTED WITHOUT FURTHER CLINICAL AND CULTURE CORRELATION. SUSCEPTIBILITIES AVAILABLE ONLY ON REQUEST. Performed at [facility], [address], [place], [place] [address]    Report Status 09/14/2021 FINAL  Final  Blood Culture ID Panel (Reflexed)     Status: Abnormal   Collection Time: 09/11/21  6:42 AM  Result Value Ref Range Status   Enterococcus faecalis NOT DETECTED NOT DETECTED Final   Enterococcus Faecium NOT DETECTED NOT DETECTED Final   Listeria monocytogenes NOT DETECTED NOT DETECTED Final   Staphylococcus species DETECTED (A) NOT DETECTED Final    Comment: CRITICAL RESULT CALLED TO, READ BACK BY AND VERIFIED WITH: PHARMD K.SHADE AT 1213 ON 09/12/2021 BY T.SAAD.    Staphylococcus aureus (BCID) NOT DETECTED NOT DETECTED Final   Staphylococcus epidermidis DETECTED (A) NOT DETECTED Final    Comment: CRITICAL RESULT CALLED TO, READ BACK BY AND VERIFIED WITH: PHARMD K.SHADE AT 1213 ON 09/12/2021 BY T.SAAD.    Staphylococcus lugdunensis NOT DETECTED NOT DETECTED Final   Streptococcus species NOT DETECTED NOT DETECTED Final   Streptococcus agalactiae NOT DETECTED NOT DETECTED Final   Streptococcus pneumoniae NOT DETECTED NOT DETECTED Final   Streptococcus pyogenes NOT DETECTED NOT DETECTED Final   A.calcoaceticus-baumannii NOT DETECTED NOT DETECTED Final   Bacteroides fragilis NOT DETECTED NOT DETECTED Final   Enterobacterales NOT DETECTED NOT DETECTED Final   Enterobacter cloacae complex NOT DETECTED NOT DETECTED Final   Escherichia coli NOT DETECTED NOT DETECTED Final   Klebsiella aerogenes NOT DETECTED NOT DETECTED Final   Klebsiella oxytoca NOT DETECTED NOT DETECTED Final   Klebsiella pneumoniae NOT DETECTED NOT DETECTED Final   Proteus species NOT DETECTED NOT DETECTED Final   Salmonella species NOT DETECTED NOT DETECTED Final   Serratia marcescens NOT DETECTED NOT DETECTED Final   Haemophilus influenzae NOT  DETECTED NOT DETECTED Final   Neisseria meningitidis NOT DETECTED NOT DETECTED Final   Pseudomonas aeruginosa NOT DETECTED NOT DETECTED Final   Stenotrophomonas maltophilia NOT DETECTED NOT DETECTED Final   Candida albicans NOT DETECTED NOT DETECTED Final   Candida auris NOT DETECTED NOT DETECTED Final   Candida glabrata NOT DETECTED NOT DETECTED Final   Candida krusei NOT DETECTED NOT DETECTED Final   Candida parapsilosis NOT DETECTED NOT DETECTED Final   Candida tropicalis NOT DETECTED NOT DETECTED Final   Cryptococcus neoformans/gattii NOT DETECTED NOT DETECTED Final   Methicillin resistance mecA/C NOT DETECTED NOT DETECTED Final    Comment: Performed at [facility], [address], [place], [place] [address]  Resp Panel by RT-PCR (Flu A&B, Covid) Nasopharyngeal  Swab     Status: None   Collection Time: 09/11/21 10:45 AM   Specimen: Nasopharyngeal Swab; Nasopharyngeal(NP) swabs in vial transport medium  Result Value Ref Range Status   SARS Coronavirus 2 by RT PCR NEGATIVE NEGATIVE Final    Comment: (NOTE) SARS-CoV-2 target nucleic acids are NOT DETECTED.  The SARS-CoV-2 RNA is generally detectable in upper respiratory specimens during the acute phase of infection. The lowest concentration of SARS-CoV-2 viral copies this assay can detect is 138 copies/mL. A negative result does not preclude SARS-Cov-2 infection and should not be used as the sole basis for treatment or other patient management decisions. A negative result may occur with  improper specimen collection/handling, submission of specimen other than nasopharyngeal swab, presence of viral mutation(s) within the areas targeted by this assay, and inadequate number of viral copies(<138 copies/mL). A negative result must be combined with clinical observations, patient history, and epidemiological information. The expected result is Negative.  Fact Sheet for Patients:  BloggerCourse.com  Fact  Sheet for Healthcare Providers:  SeriousBroker.it  This test is no t yet approved or cleared by the [place] FDA and  has been authorized for detection and/or diagnosis of SARS-CoV-2 by FDA under an Emergency Use Authorization (EUA). This EUA will remain  in effect (meaning this test can be used) for the duration of the COVID-19 declaration under Section 564(b)(1) of the Act, 21 U.S.C.section 360bbb-3(b)(1), unless the authorization is terminated  or revoked sooner.       Influenza A by PCR NEGATIVE NEGATIVE Final   Influenza B by PCR NEGATIVE NEGATIVE Final    Comment: (NOTE) The Xpert Xpress SARS-CoV-2/FLU/RSV plus assay is intended as an aid in the diagnosis of influenza from Nasopharyngeal swab specimens and should not be used as a sole basis for treatment. Nasal washings and aspirates are unacceptable for Xpert Xpress SARS-CoV-2/FLU/RSV testing.  Fact Sheet for Patients: BloggerCourse.com  Fact Sheet for Healthcare Providers: SeriousBroker.it  This test is not yet approved or cleared by the [place] FDA and has been authorized for detection and/or diagnosis of SARS-CoV-2 by FDA under an Emergency Use Authorization (EUA). This EUA will remain in effect (meaning this test can be used) for the duration of the COVID-19 declaration under Section 564(b)(1) of the Act, 21 U.S.C. section 360bbb-3(b)(1), unless the authorization is terminated or revoked.  Performed at [facility], [address], [place], [place] [address]   Blood culture (routine x 2)     Status: Abnormal   Collection Time: 09/11/21  3:52 PM   Specimen: BLOOD RIGHT HAND  Result Value Ref Range Status   Specimen Description   Final    BLOOD RIGHT HAND Performed at [facility], [address] [place], [place] [address]    Special Requests   Final    BOTTLES DRAWN AEROBIC AND  ANAEROBIC Blood Culture adequate volume Performed at [facility], [address], [place], [place] [address]    Culture  Setup Time   Final    GRAM POSITIVE COCCI IN CLUSTERS ANAEROBIC BOTTLE ONLY CRITICAL RESULT CALLED TO, READ BACK BY AND VERIFIED WITH: Damaris Hippo PHARMD, AT 9147 09/13/21 D. VANHOOK    Culture (A)  Final    STAPHYLOCOCCUS HOMINIS THE SIGNIFICANCE OF ISOLATING THIS ORGANISM FROM A SINGLE SET OF BLOOD CULTURES WHEN MULTIPLE SETS ARE DRAWN IS UNCERTAIN. PLEASE NOTIFY THE MICROBIOLOGY DEPARTMENT WITHIN ONE WEEK IF SPECIATION AND SENSITIVITIES ARE REQUIRED. Performed at [facility], [address] [place], [place] [address]    Report Status 09/15/2021 FINAL  Final  Culture, blood (Routine X 2) w Reflex to ID Panel     Status: None   Collection Time: 09/11/21  5:14 PM   Specimen: BLOOD  Result Value Ref Range Status   Specimen Description   Final    BLOOD BLOOD LEFT HAND Performed at [facility], [address], [place], [place] [address]    Special Requests   Final    BOTTLES DRAWN AEROBIC ONLY Blood Culture results may not be optimal due to an inadequate volume of blood received in culture bottles Performed at [facility], [address] [place], [place] [address]    Culture   Final    NO GROWTH 5 DAYS Performed at [facility], [address] [place], [place] [address]    Report Status 09/17/2021 FINAL  Final  Culture, blood (Routine X 2) w Reflex to ID Panel     Status: None   Collection Time: 09/14/21 12:40 PM   Specimen: BLOOD  Result Value Ref Range Status   Specimen Description   Final    BLOOD RIGHT ANTECUBITAL Performed at [facility], [address] [place], [place] [address]    Special Requests   Final    BOTTLES DRAWN AEROBIC ONLY Blood Culture adequate volume Performed at [facility], [address], [place], [place] [address]     Culture   Final    NO GROWTH 5 DAYS Performed at [facility], [address] [place], [place] [address]    Report Status 09/19/2021 FINAL  Final  Culture, blood (Routine X 2) w Reflex to ID Panel     Status: None   Collection Time: 09/14/21 12:40 PM   Specimen: BLOOD  Result Value Ref Range Status   Specimen Description   Final    BLOOD BLOOD LEFT HAND Performed at [facility], [address], [place], [place] [address]    Special Requests   Final    BOTTLES DRAWN AEROBIC ONLY Blood Culture adequate volume Performed at [facility], [address] [place], [place] [address]    Culture   Final    NO GROWTH 5 DAYS Performed at [facility], [address], [place], [place] [address]    Report Status 09/19/2021 FINAL  Final          Radiology Studies: No results found.      Scheduled Meds:  acetaminophen  1,000 mg Oral Q6H   alvimopan  12 mg Oral BID   cyclobenzaprine  10 mg Oral TID   enoxaparin (LOVENOX) injection  40 mg Subcutaneous Q12H   feeding supplement  237 mL Oral BID BM   gabapentin  300 mg Oral TID   levothyroxine  88 mcg Oral QAC breakfast   medroxyPROGESTERone  10 mg Oral Daily   pantoprazole  40 mg Oral Daily   potassium chloride  40 mEq Oral Daily   Continuous Infusions:  anidulafungin 100 mg (09/18/21 1758)   piperacillin-tazobactam (ZOSYN)  IV 3.375 g (09/19/21 0907)     LOS: 8 days    Time spent: 40 minutes    Ramiro Harvest, MD [facility]   To contact the attending provider between 7A-7P or the covering provider during after hours 7P-7A, please log into the web site [email or web address] and access using universal [place] password for that web site. If  you do not have the password, please call the hospital operator.  09/19/2021, 10:49 AM

## 2021-09-20 LAB — CBC WITH DIFFERENTIAL/PLATELET
Abs Immature Granulocytes: 0.29 10*3/uL — ABNORMAL HIGH (ref 0.00–0.07)
Basophils Absolute: 0.1 10*3/uL (ref 0.0–0.1)
Basophils Relative: 1 %
Eosinophils Absolute: 0.3 10*3/uL (ref 0.0–0.5)
Eosinophils Relative: 2 %
HCT: 41.6 % (ref 36.0–46.0)
Hemoglobin: 13.3 g/dL (ref 12.0–15.0)
Immature Granulocytes: 2 %
Lymphocytes Relative: 19 %
Lymphs Abs: 2.6 10*3/uL (ref 0.7–4.0)
MCH: 31.3 pg (ref 26.0–34.0)
MCHC: 32 g/dL (ref 30.0–36.0)
MCV: 97.9 fL (ref 80.0–100.0)
Monocytes Absolute: 1 10*3/uL (ref 0.1–1.0)
Monocytes Relative: 7 %
Neutro Abs: 9.4 10*3/uL — ABNORMAL HIGH (ref 1.7–7.7)
Neutrophils Relative %: 69 %
Platelets: 226 10*3/uL (ref 150–400)
RBC: 4.25 MIL/uL (ref 3.87–5.11)
RDW: 12.3 % (ref 11.5–15.5)
WBC: 13.7 10*3/uL — ABNORMAL HIGH (ref 4.0–10.5)
nRBC: 0 % (ref 0.0–0.2)

## 2021-09-20 LAB — MAGNESIUM: Magnesium: 1.9 mg/dL (ref 1.7–2.4)

## 2021-09-20 LAB — BASIC METABOLIC PANEL
Anion gap: 9 (ref 5–15)
BUN: 7 mg/dL (ref 6–20)
CO2: 22 mmol/L (ref 22–32)
Calcium: 8.3 mg/dL — ABNORMAL LOW (ref 8.9–10.3)
Chloride: 99 mmol/L (ref 98–111)
Creatinine, Ser: 0.7 mg/dL (ref 0.44–1.00)
GFR, Estimated: >60 mL/min (ref >60)
Glucose, Bld: 99 mg/dL (ref 70–99)
Potassium: 3.7 mmol/L (ref 3.5–5.1)
Sodium: 130 mmol/L — ABNORMAL LOW (ref 135–145)

## 2021-09-20 LAB — SURGICAL PATHOLOGY

## 2021-09-20 MED ORDER — GABAPENTIN 400 MG PO CAPS
400.0000 mg | ORAL_CAPSULE | Freq: Three times a day (TID) | ORAL | Status: DC
  Administered 2021-09-20 – 2021-09-22 (×9): 400 mg via ORAL
  Filled 2021-09-20 (×9): qty 1

## 2021-09-20 MED ORDER — LEVOTHYROXINE SODIUM 88 MCG PO TABS
88.0000 ug | ORAL_TABLET | Freq: Every day | ORAL | Status: DC
  Administered 2021-09-21 – 2021-09-24 (×4): 88 ug via ORAL
  Filled 2021-09-20 (×4): qty 1

## 2021-09-20 MED ORDER — LEVOTHYROXINE SODIUM 100 MCG PO TABS: 100.0000 ug | ORAL_TABLET | Freq: Every day | ORAL | Status: DC

## 2021-09-20 MED ORDER — TRAMADOL HCL 50 MG PO TABS
100.0000 mg | ORAL_TABLET | Freq: Four times a day (QID) | ORAL | Status: DC | PRN
  Administered 2021-09-20 – 2021-09-23 (×7): 100 mg via ORAL
  Filled 2021-09-20 (×7): qty 2

## 2021-09-20 NOTE — Progress Notes (Signed)
Lori Mcknight NZ:[id number] 1985/11/03  CARE TEAM:  PCP: [name], NP  Outpatient Care Team: Patient Care Team: [name], NP as PCP - General (Nurse Practitioner) [name], MD (Inactive) as Consulting Physician (Obstetrics and Gynecology) [name], MD as Consulting Physician (Colon and Rectal Surgery) Ccs, Md, MD as Consulting Physician (General Surgery) [name], MD as Consulting Physician (Urology)  Inpatient Treatment Team: Treatment Team: Attending Provider: [name], MD; Consulting Physician: [name], Md, MD; Consulting Physician: [name], MD; Social Worker: Merri Brunette; [place] Nurse: Lissa Morales Rudi Heap, RN; Consulting Physician: [name], MD; Rounding Team: Fatima Blank, MD; Registered Nurse: Nicholes Calamity, RN; Occupational Therapist: Lenward Chancellor, OT; Utilization Review: Tressie Stalker, RN; Physical Therapist: Carley Hammed, PT   Problem List:   Principal Problem:   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 Active Problems:   Obesity, morbid, BMI 10 or higher ([place])   Family history of colon cancer in father   Essential hypertension   Hypothyroid   Anemia   Endometriosis s/p ablation   Hypokalemia   Asthma, mild intermittent   Hypomagnesemia   Fecal peritonitis ([place])   Intra-abdominal abscess s/p robotic drainage 09/18/2021   Colostomy in place [facility])   3 Days Post-Op  09/17/2021  POST-OPERATIVE DIAGNOSIS:   Perforated sigmoid colon most likely due to diverticulitis  Feculent peritonitis Abscesses x 3   PROCEDURE:   XI ROBOT ASSISTED SIGMOID COLECTOMY WITH END COLOSTOMY (HARTMANN) MOBILIZATION OF SPLENIC FLEXURE OF COLON DRAINAGE OF ABSCESSES X 3 ROBOTIC LYSIS OF ADHESIONS TRANSVERSUS ABDOMINIS PLANE (TAP) BLOCK - BILATERAL   SURGEON:  [name], MD   OR FINDINGS:    Patient had large phlegmon of old mentum and mid ileum adherent to a perforated sigmoid  colon.  Interloop abscess with feculent peritonitis.  Left adnexa Fallopian tube very dilated with purulence consistent with secondary tubo-ovarian abscess.  Very dense adhesions to the left posterolateral bladder dome with moderate abscess and purulence there as well.  All these 3 abscesses were drained.  Bladder distended and no evidence of any active colovesical fistula.  No evidence of any appendicitis.  Some dilatation consistent with probable partial small bowel obstruction at the site of the small bowel trying to contain the perforated colon with feculent peritonitis.  Therefore no anastomosis done.  End Hartmann resection.  No obvious metastatic disease on visceral parietal peritoneum or liver.   CASE DATA:   Type of patient?: LDOW CASE (Surgical Hospitalist [facility] Inpatient)   Status of Case? URGENT Add On   Infection Present At Time Of Surgery (PATOS)?   Feculent peritonitis, phlegmon, abscesses x3.      Assessment  Stable  [facility] Stay = 9 days)  Plan:  -IV ABx x 5d postop (Zosyn.  I added Eraxis given chronicity and persistent abscess for almost 2 years)  -DC drain.  Low threshold to do repeat CAT scan if has evidence of ileus or fevers or leukocytosis to rule out postoperative abscess.  Hopefully less likely now.  -ERAS protocol.  -Adv diet gradually -keep on soft diet until has definite bowel  -f/u pathology - favor diverticulitis  -colostomy care and teaching  -Replace electrolytes.  Scheduled potassium given borderline low.  Hypomagnesemia rated..  -VTE prophylaxis- SCDs.  Enoxaparin OK - adjust dose per pharmacy given BMI 58  -I saw no strong evidence of polycystic ovarian syndrome interoperatively nor prior ultrasounds but will defer to medicine and  gynecology if that truly needs to be treated  -mobilize as tolerated to help recovery.  She still seems to just want to be in the room.  Need to get her up moving around more to help minimize ileus and pulmonary  issues.  Consulting therapies for evaluation to see if benefit of home health or equipment issues  -Moderate malnutrition given her prolonged diverticulitis and chronic abscess.  Supplemental shakes as tolerated.  Hopefully should resolve.  -Hopefully can do second surgery in 3-6 months once this episode has resolved finally.  We will have to see how she does in the first 6 weeks with the surgery  -No evidence of colovesical fistula despite abscess on bladder.  Can remove per protocol POD#1.  I&O cath as needed  Disposition: Per primary service.  Anticipate discharge next week with home health for colostomy.  Her financial and other issues make her recovery challenging.       40 minutes spent in review, evaluation, examination, counseling, and coordination of care.   I have reviewed this patient's available data, including medical history, events of note, physical examination and test results as part of my evaluation.  A significant portion of that time was spent in counseling.  Care during the described time interval was provided by me.  09/20/2021    Subjective: (Chief complaint)  Patient getting up.  Still rather sore.  Tolerating some soft foods.  Nursing just outside room  Objective:  Vital signs:  Vitals:   09/19/21 2046 09/19/21 2330 09/20/21 0332 09/20/21 0500  BP: (!) 156/104 (!) 157/102 (!) 131/101   Pulse: 90 91 94   Resp:   (!) 22   Temp:   97.9 F (36.6 C)   TempSrc:   Oral   SpO2:   98%   Weight:    (!) 174.8 kg  Height:        Last BM Date: 09/04/21 (blood in bag)  Intake/Output   Yesterday:  12/08 0701 - 12/09 0700 In: -  Out: 30 [Drains:30] This shift:  No intake/output data recorded.  Bowel function:  Flatus: YES  BM:  No  Drain: Serosanguinous   Physical Exam:  General: Pt awake/alert in no acute distress Eyes: PERRL, normal EOM.  Sclera clear.  No icterus Neuro: CN II-XII intact w/o focal sensory/motor deficits. Lymph: No  head/neck/groin lymphadenopathy Psych:  No delerium/psychosis/paranoia.  Oriented x 4 HENT: Normocephalic, Mucus membranes moist.  No thrush Neck: Supple, No tracheal deviation.  No obvious thyromegaly Chest: No pain to chest wall compression.  Good respiratory excursion.  No audible wheezing CV:  Pulses intact.  Regular rhythm.  No major extremity edema MS: Normal AROM mjr joints.  No obvious deformity  Abdomen: Morbidly obese Soft.  Nondistended.  Mildly tender at incisions only.  No evidence of peritonitis.  No incarcerated hernias. Left-sided colostomy pink.  No major gas nor stool  Ext:   No deformity.  No mjr edema.  No cyanosis Skin: No petechiae / purpurea.  No major sores.  Warm and dry    Results:   Cultures: Recent Results (from the past 720 hour(s))  Resp Panel by RT-PCR (Flu A&B, Covid) Nasopharyngeal Swab     Status: None   Collection Time: 08/28/21  1:54 PM   Specimen: Nasopharyngeal Swab; Nasopharyngeal(NP) swabs in vial transport medium  Result Value Ref Range Status   SARS Coronavirus 2 by RT PCR NEGATIVE NEGATIVE Final    Comment: (NOTE) SARS-CoV-2 target nucleic acids are NOT DETECTED.  The SARS-CoV-2 RNA is generally detectable in upper respiratory specimens during the acute phase of infection. The lowest concentration of SARS-CoV-2 viral copies this assay can detect is 138 copies/mL. A negative result does not preclude SARS-Cov-2 infection and should not be used as the sole basis for treatment or other patient management decisions. A negative result may occur with  improper specimen collection/handling, submission of specimen other than nasopharyngeal swab, presence of viral mutation(s) within the areas targeted by this assay, and inadequate number of viral copies(<138 copies/mL). A negative result must be combined with clinical observations, patient history, and epidemiological information. The expected result is Negative.  Fact Sheet for Patients:   EntrepreneurPulse.com.au  Fact Sheet for Healthcare Providers:  IncredibleEmployment.be  This test is no t yet approved or cleared by the [place] FDA and  has been authorized for detection and/or diagnosis of SARS-CoV-2 by FDA under an Emergency Use Authorization (EUA). This EUA will remain  in effect (meaning this test can be used) for the duration of the COVID-19 declaration under Section 564(b)(1) of the Act, 21 U.S.C.section 360bbb-3(b)(1), unless the authorization is terminated  or revoked sooner.       Influenza A by PCR NEGATIVE NEGATIVE Final   Influenza B by PCR NEGATIVE NEGATIVE Final    Comment: (NOTE) The Xpert Xpress SARS-CoV-2/FLU/RSV plus assay is intended as an aid in the diagnosis of influenza from Nasopharyngeal swab specimens and should not be used as a sole basis for treatment. Nasal washings and aspirates are unacceptable for Xpert Xpress SARS-CoV-2/FLU/RSV testing.  Fact Sheet for Patients: EntrepreneurPulse.com.au  Fact Sheet for Healthcare Providers: IncredibleEmployment.be  This test is not yet approved or cleared by the [place] FDA and has been authorized for detection and/or diagnosis of SARS-CoV-2 by FDA under an Emergency Use Authorization (EUA). This EUA will remain in effect (meaning this test can be used) for the duration of the COVID-19 declaration under Section 564(b)(1) of the Act, 21 U.S.C. section 360bbb-3(b)(1), unless the authorization is terminated or revoked.  Performed at [facility], [address] [place], [place] [address]   Blood culture (routine x 2)     Status: Abnormal   Collection Time: 09/11/21  6:42 AM   Specimen: BLOOD  Result Value Ref Range Status   Specimen Description   Final    BLOOD BLOOD LEFT FOREARM Performed at [facility], [address] [place], [place] [address]    Special  Requests   Final    Blood Culture adequate volume BOTTLES DRAWN AEROBIC AND ANAEROBIC Performed at [facility], [place], [place] [address]    Culture  Setup Time   Final    GRAM POSITIVE COCCI IN BOTH AEROBIC AND ANAEROBIC BOTTLES CRITICAL RESULT CALLED TO, READ BACK BY AND VERIFIED WITH: PHARMD K.SHADE AT 1213 ON 09/12/2021 BY T.SAAD.    Culture (A)  Final    STAPHYLOCOCCUS EPIDERMIDIS THE SIGNIFICANCE OF ISOLATING THIS ORGANISM FROM A SINGLE VENIPUNCTURE CANNOT BE PREDICTED WITHOUT FURTHER CLINICAL AND CULTURE CORRELATION. SUSCEPTIBILITIES AVAILABLE ONLY ON REQUEST. Performed at [facility], [place] [address] [place], [place] [address]    Report Status 09/14/2021 FINAL  Final  Blood Culture ID Panel (Reflexed)     Status: Abnormal   Collection Time: 09/11/21  6:42 AM  Result Value Ref Range Status   Enterococcus faecalis NOT DETECTED NOT DETECTED Final   Enterococcus Faecium NOT DETECTED NOT DETECTED Final   Listeria monocytogenes NOT DETECTED NOT DETECTED Final  Staphylococcus species DETECTED (A) NOT DETECTED Final    Comment: CRITICAL RESULT CALLED TO, READ BACK BY AND VERIFIED WITH: PHARMD K.SHADE AT 1213 ON 09/12/2021 BY T.SAAD.    Staphylococcus aureus (BCID) NOT DETECTED NOT DETECTED Final   Staphylococcus epidermidis DETECTED (A) NOT DETECTED Final    Comment: CRITICAL RESULT CALLED TO, READ BACK BY AND VERIFIED WITH: PHARMD K.SHADE AT 1213 ON 09/12/2021 BY T.SAAD.    Staphylococcus lugdunensis NOT DETECTED NOT DETECTED Final   Streptococcus species NOT DETECTED NOT DETECTED Final   Streptococcus agalactiae NOT DETECTED NOT DETECTED Final   Streptococcus pneumoniae NOT DETECTED NOT DETECTED Final   Streptococcus pyogenes NOT DETECTED NOT DETECTED Final   A.calcoaceticus-baumannii NOT DETECTED NOT DETECTED Final   Bacteroides fragilis NOT DETECTED NOT DETECTED Final   Enterobacterales NOT DETECTED NOT DETECTED Final    Enterobacter cloacae complex NOT DETECTED NOT DETECTED Final   Escherichia coli NOT DETECTED NOT DETECTED Final   Klebsiella aerogenes NOT DETECTED NOT DETECTED Final   Klebsiella oxytoca NOT DETECTED NOT DETECTED Final   Klebsiella pneumoniae NOT DETECTED NOT DETECTED Final   Proteus species NOT DETECTED NOT DETECTED Final   Salmonella species NOT DETECTED NOT DETECTED Final   Serratia marcescens NOT DETECTED NOT DETECTED Final   Haemophilus influenzae NOT DETECTED NOT DETECTED Final   Neisseria meningitidis NOT DETECTED NOT DETECTED Final   Pseudomonas aeruginosa NOT DETECTED NOT DETECTED Final   Stenotrophomonas maltophilia NOT DETECTED NOT DETECTED Final   Candida albicans NOT DETECTED NOT DETECTED Final   Candida auris NOT DETECTED NOT DETECTED Final   Candida glabrata NOT DETECTED NOT DETECTED Final   Candida krusei NOT DETECTED NOT DETECTED Final   Candida parapsilosis NOT DETECTED NOT DETECTED Final   Candida tropicalis NOT DETECTED NOT DETECTED Final   Cryptococcus neoformans/gattii NOT DETECTED NOT DETECTED Final   Methicillin resistance mecA/C NOT DETECTED NOT DETECTED Final    Comment: Performed at [facility], [address] [place], [place] [address]  Resp Panel by RT-PCR (Flu A&B, Covid) Nasopharyngeal Swab     Status: None   Collection Time: 09/11/21 10:45 AM   Specimen: Nasopharyngeal Swab; Nasopharyngeal(NP) swabs in vial transport medium  Result Value Ref Range Status   SARS Coronavirus 2 by RT PCR NEGATIVE NEGATIVE Final    Comment: (NOTE) SARS-CoV-2 target nucleic acids are NOT DETECTED.  The SARS-CoV-2 RNA is generally detectable in upper respiratory specimens during the acute phase of infection. The lowest concentration of SARS-CoV-2 viral copies this assay can detect is 138 copies/mL. A negative result does not preclude SARS-Cov-2 infection and should not be used as the sole basis for treatment or other patient management decisions. A negative result  may occur with  improper specimen collection/handling, submission of specimen other than nasopharyngeal swab, presence of viral mutation(s) within the areas targeted by this assay, and inadequate number of viral copies(<138 copies/mL). A negative result must be combined with clinical observations, patient history, and epidemiological information. The expected result is Negative.  Fact Sheet for Patients:  EntrepreneurPulse.com.au  Fact Sheet for Healthcare Providers:  IncredibleEmployment.be  This test is no t yet approved or cleared by the [place] FDA and  has been authorized for detection and/or diagnosis of SARS-CoV-2 by FDA under an Emergency Use Authorization (EUA). This EUA will remain  in effect (meaning this test can be used) for the duration of the COVID-19 declaration under Section 564(b)(1) of the Act, 21 U.S.C.section 360bbb-3(b)(1), unless the authorization is terminated  or  revoked sooner.       Influenza A by PCR NEGATIVE NEGATIVE Final   Influenza B by PCR NEGATIVE NEGATIVE Final    Comment: (NOTE) The Xpert Xpress SARS-CoV-2/FLU/RSV plus assay is intended as an aid in the diagnosis of influenza from Nasopharyngeal swab specimens and should not be used as a sole basis for treatment. Nasal washings and aspirates are unacceptable for Xpert Xpress SARS-CoV-2/FLU/RSV testing.  Fact Sheet for Patients: EntrepreneurPulse.com.au  Fact Sheet for Healthcare Providers: IncredibleEmployment.be  This test is not yet approved or cleared by the [place] FDA and has been authorized for detection and/or diagnosis of SARS-CoV-2 by FDA under an Emergency Use Authorization (EUA). This EUA will remain in effect (meaning this test can be used) for the duration of the COVID-19 declaration under Section 564(b)(1) of the Act, 21 U.S.C. section 360bbb-3(b)(1), unless the authorization is terminated  or revoked.  Performed at [facility], [address], [place], [place] [address]   Blood culture (routine x 2)     Status: Abnormal   Collection Time: 09/11/21  3:52 PM   Specimen: BLOOD RIGHT HAND  Result Value Ref Range Status   Specimen Description   Final    BLOOD RIGHT HAND Performed at [facility] [address] [place], [place] [address]    Special Requests   Final    BOTTLES DRAWN AEROBIC AND ANAEROBIC Blood Culture adequate volume Performed at [facility] [address], [place], [place] [address]    Culture  Setup Time   Final    GRAM POSITIVE COCCI IN CLUSTERS ANAEROBIC BOTTLE ONLY CRITICAL RESULT CALLED TO, READ BACK BY AND VERIFIED WITH: [place], AT [id number] 09/13/21 D. VANHOOK    Culture (A)  Final    STAPHYLOCOCCUS HOMINIS THE SIGNIFICANCE OF ISOLATING THIS ORGANISM FROM A SINGLE SET OF BLOOD CULTURES WHEN MULTIPLE SETS ARE DRAWN IS UNCERTAIN. PLEASE NOTIFY THE MICROBIOLOGY DEPARTMENT WITHIN ONE WEEK IF SPECIATION AND SENSITIVITIES ARE REQUIRED. Performed at [facility], [place] [address] [place], [place] [address]    Report Status 09/15/2021 FINAL  Final  Culture, blood (Routine X 2) w Reflex to ID Panel     Status: None   Collection Time: 09/11/21  5:14 PM   Specimen: BLOOD  Result Value Ref Range Status   Specimen Description   Final    BLOOD BLOOD LEFT HAND Performed at [facility] [address] [place], [place] [address]    Special Requests   Final    BOTTLES DRAWN AEROBIC ONLY Blood Culture results may not be optimal due to an inadequate volume of blood received in culture bottles Performed at [facility] [address] [place], [place] [address]    Culture   Final    NO GROWTH 5 DAYS Performed at [facility], [place] [address] [place], [place] [address]    Report Status 09/17/2021 FINAL  Final  Culture, blood (Routine X 2) w  Reflex to ID Panel     Status: None   Collection Time: 09/14/21 12:40 PM   Specimen: BLOOD  Result Value Ref Range Status   Specimen Description   Final    BLOOD RIGHT ANTECUBITAL Performed at [facility] [address], [place], [place] [address]    Special Requests   Final    BOTTLES DRAWN AEROBIC ONLY Blood Culture adequate volume Performed at [facility] [address] [place],  [place] [address]    Culture   Final    NO GROWTH 5 DAYS Performed at [facility], [address], [place], [place] [address]    Report Status 09/19/2021 FINAL  Final  Culture, blood (Routine X 2) w Reflex to ID Panel     Status: None   Collection Time: 09/14/21 12:40 PM   Specimen: BLOOD  Result Value Ref Range Status   Specimen Description   Final    BLOOD BLOOD LEFT HAND Performed at [facility], [address], [place], [place] [address]    Special Requests   Final    BOTTLES DRAWN AEROBIC ONLY Blood Culture adequate volume Performed at [facility], [address] [place], [place] [address]    Culture   Final    NO GROWTH 5 DAYS Performed at [facility], [address], [place], [place] [address]    Report Status 09/19/2021 FINAL  Final    Labs: Results for orders placed or performed during the hospital encounter of 09/11/21 (from the past 48 hour(s))  Basic metabolic panel     Status: Abnormal   Collection Time: 09/19/21  5:34 AM  Result Value Ref Range   Sodium 132 (L) 135 - 145 mmol/L   Potassium 4.0 3.5 - 5.1 mmol/L   Chloride 101 98 - 111 mmol/L   CO2 21 (L) 22 - 32 mmol/L   Glucose, Bld 128 (H) 70 - 99 mg/dL    Comment: Glucose reference range applies only to samples taken after fasting for at least 8 hours.   BUN <5 (L) 6 - 20 mg/dL   Creatinine, Ser 0.62 0.44 - 1.00 mg/dL   Calcium 8.4 (L) 8.9 - 10.3 mg/dL   GFR, Estimated >37 >62 mL/min    Comment: (NOTE) Calculated using the  CKD-EPI Creatinine Equation (2021)    Anion gap 10 5 - 15    Comment: Performed at [facility], [address], [place], [place] [address]  Magnesium     Status: None   Collection Time: 09/19/21  5:34 AM  Result Value Ref Range   Magnesium 2.0 1.7 - 2.4 mg/dL    Comment: Performed at [facility], [address] [place], [place] [address]  CBC with Differential/Platelet     Status: Abnormal   Collection Time: 09/19/21  5:34 AM  Result Value Ref Range   WBC 14.8 (H) 4.0 - 10.5 K/uL   RBC 4.70 3.87 - 5.11 MIL/uL   Hemoglobin 15.1 (H) 12.0 - 15.0 g/dL   HCT 73.7 10.6 - 26.9 %   MCV 96.8 80.0 - 100.0 fL   MCH 32.1 26.0 - 34.0 pg   MCHC 33.2 30.0 - 36.0 g/dL   RDW 48.5 46.2 - 70.3 %   Platelets 251 150 - 400 K/uL   nRBC 0.0 0.0 - 0.2 %   Neutrophils Relative % 77 %   Neutro Abs 11.5 (H) 1.7 - 7.7 K/uL   Lymphocytes Relative 15 %   Lymphs Abs 2.2 0.7 - 4.0 K/uL   Monocytes Relative 6 %   Monocytes Absolute 0.8 0.1 - 1.0 K/uL   Eosinophils Relative 1 %   Eosinophils Absolute 0.1 0.0 - 0.5 K/uL   Basophils Relative 0 %   Basophils Absolute 0.1 0.0 - 0.1 K/uL   Immature Granulocytes 1 %   Abs Immature Granulocytes 0.17 (H) 0.00 - 0.07 K/uL    Comment: Performed at [facility], [place] [address] [place],  [address]    Imaging / Studies: No results found.  Medications / Allergies: per chart  Antibiotics: Anti-infectives (From admission, onward)    Start     Dose/Rate Route Frequency Ordered Stop   09/18/21 1400  anidulafungin (ERAXIS) 100 mg in sodium chloride 0.9 % 100 mL IVPB        100 mg 78 mL/hr over 100 Minutes Intravenous Every 24 hours 09/17/21 2117 09/22/21 1359   09/18/21 0000  piperacillin-tazobactam (ZOSYN) IVPB 3.375 g        3.375 g 12.5 mL/hr over 240 Minutes Intravenous Every 8 hours 09/17/21 2117 09/22/21 2359   09/17/21 1830  clindamycin (CLEOCIN) 900 mg, gentamicin (GARAMYCIN) 240 mg in sodium  chloride 0.9 % 1,000 mL for intraperitoneal lavage  Status:  Discontinued          As needed 09/17/21 1850 09/17/21 2117   09/17/21 1500  clindamycin (CLEOCIN) 900 mg, gentamicin (GARAMYCIN) 240 mg in sodium chloride 0.9 % 1,000 mL for intraperitoneal lavage  Status:  Discontinued       Note to Pharmacy: Have in the  [facility] room for final irrigation in bowel surgery case to minimize risk of abscess/infection Pharmacy may adjust dosing strength, schedule, rate of infusion, etc as needed to optimize therapy    Irrigation To Surgery 09/17/21 1448 09/17/21 2117   09/17/21 1400  anidulafungin (ERAXIS) 100 mg in sodium chloride 0.9 % 100 mL IVPB  Status:  Discontinued        100 mg 78 mL/hr over 100 Minutes Intravenous Every 24 hours 09/16/21 1352 09/17/21 2117   09/17/21 0600  cefoTEtan (CEFOTAN) 2 g in sodium chloride 0.9 % 100 mL IVPB        2 g 200 mL/hr over 30 Minutes Intravenous On call to O.R. 09/16/21 0835 09/17/21 1513   09/16/21 1500  anidulafungin (ERAXIS) 200 mg in sodium chloride 0.9 % 200 mL IVPB        200 mg 78 mL/hr over 200 Minutes Intravenous  Once 09/16/21 1400 09/16/21 2338   09/16/21 1400  neomycin (MYCIFRADIN) tablet 1,000 mg  Status:  Discontinued       See Hyperspace for full Linked Orders Report.   1,000 mg Oral 3 times per day 09/16/21 0835 09/16/21 1107   09/16/21 1400  metroNIDAZOLE (FLAGYL) tablet 1,000 mg       See Hyperspace for full Linked Orders Report.   1,000 mg Oral 3 times per day 09/16/21 0835 09/16/21 2008   09/16/21 1247  neomycin (MYCIFRADIN) tablet 500 mg  Status:  Discontinued        500 mg Oral As directed 09/16/21 1247 09/16/21 1254   09/11/21 1600  piperacillin-tazobactam (ZOSYN) IVPB 3.375 g  Status:  Discontinued        3.375 g 12.5 mL/hr over 240 Minutes Intravenous Every 8 hours 09/11/21 1538 09/17/21 2117   09/11/21 0645  piperacillin-tazobactam (ZOSYN) IVPB 3.375 g        3.375 g 100 mL/hr over 30 Minutes Intravenous  Once 09/11/21 [id number] 09/11/21  0729         Note: Portions of this report may have been transcribed using voice recognition software. Every effort was made to ensure accuracy; however, inadvertent computerized transcription errors may be present.   Any transcriptional errors that result from this process are unintentional.    [name], MD, FACS, MASCRS Esophageal, Gastrointestinal & Colorectal Surgery Robotic and Minimally Invasive Surgery  [facility], [place]  [place] [address], [place], [place] [address] [phone] Fax [phone] Main  CONTACT INFORMATION:  Weekday (9AM-5PM): Call [facility] main office at [phone]  Weeknight (5PM-9AM) or Weekend/Holiday: Check [email or web address] (password " [email or web address]") for General Surgery [facility] coverage  (Please, do not use SecureChat as it is not reliable communication to operating surgeons for immediate patient care)      09/20/2021  7:14 AM

## 2021-09-20 NOTE — Evaluation (Signed)
Occupational Therapy Evaluation Patient Details Name: Lori Mcknight MRN: [id number] DOB: [birth year] Today's Date: 09/20/2021   History of Present Illness The patient is a 35 yr old woman who has had multiple recurrences of diverticulitis with abscess formation, depression anxiety, anemia, hypertension, hypothyroidism, PCOS, super morbid obesity presents this time with severe abdominal pain. Pt s/p robotic colectomy/colostomy   Clinical Impression   Ms. Lori Mcknight is a 35 year old woman s/p abdominal surgery who presents with pain, decreased activity tolerance and pain. Patient  reports pain and spasms at colostomy site and drain site - she is restless and tearful at times. Pain is her most limiting factor resulting in needing max assistance with LB ADLs and toileting. Education during evaluation predominately about needed DME and compensatory strategies. Patient will be discharging to her sister's house. She will need a bari BSC as the toilet in her sister's house is positioned away from any hand holds. Patient already has a reacher at home but will benefit from additional AE as well. Patient will benefit from skilled OT services while in hospital to improve deficits and learn compensatory strategies as needed in order to return to PLOF.  Expect that once compensatory strategies are learned and pain improved patient will not need HH at discharge.     Recommendations for follow up therapy are one component of a multi-disciplinary discharge planning process, led by the attending physician.  Recommendations may be updated based on patient status, additional functional criteria and insurance authorization.   Follow Up Recommendations  No OT follow up    Assistance Recommended at Discharge Intermittent Supervision/Assistance  Functional Status Assessment  Patient has had a recent decline in their functional status and demonstrates the ability to make significant improvements in function in a reasonable  and predictable amount of time.  Equipment Recommendations  BSC/3in1 Photographer)    Recommendations for Other Services       Precautions / Restrictions Precautions Precaution Comments: colostomy, drain Restrictions Weight Bearing Restrictions: No      Mobility Bed Mobility Overal bed mobility: Needs Assistance Bed Mobility: Supine to Sit     Supine to sit: Min assist     General bed mobility comments: min assist for transfer to side of bed - limited by pain    Transfers Overall transfer level: Needs assistance Equipment used: None Transfers: Sit to/from Stand Sit to Stand: Min guard           General transfer comment: min guard to transfer to [facility] and in room to recliner.      Balance Overall balance assessment: No apparent balance deficits (not formally assessed)                                         ADL either performed or assessed with clinical judgement   ADL Overall ADL's : Needs assistance/impaired Eating/Feeding: Independent   Grooming: Set up   Upper Body Bathing: Set up   Lower Body Bathing: Maximal assistance   Upper Body Dressing : Set up   Lower Body Dressing: Maximal assistance   Toilet Transfer: Min guard;BSC/3in1   Toileting- Clothing Manipulation and Hygiene: Maximal assistance;Sit to/from stand Toileting - Clothing Manipulation Details (indicate cue type and reason): needed assistance for hygiene due to body habitus and pain     Functional mobility during ADLs: Min guard       Vision Patient Visual Report: No  change from baseline       Perception     Praxis      Pertinent Vitals/Pain Pain Assessment: 0-10 Faces Pain Scale: Hurts worst Pain Location: abdomen Pain Descriptors / Indicators: Discomfort;Grimacing;Pressure;Moaning;Crying;Spasm Pain Intervention(s): Limited activity within patient's tolerance;Monitored during session;RN gave pain meds during session     Hand Dominance Right    Extremity/Trunk Assessment Upper Extremity Assessment Upper Extremity Assessment: Overall WFL for tasks assessed   Lower Extremity Assessment Lower Extremity Assessment: Overall WFL for tasks assessed   Cervical / Trunk Assessment Cervical / Trunk Assessment:  (body habitus)   Communication Communication Communication: No difficulties   Cognition Arousal/Alertness: Awake/alert Behavior During Therapy: WFL for tasks assessed/performed Overall Cognitive Status: Within Functional Limits for tasks assessed                                       General Comments       Exercises     Shoulder Instructions      Home Living Family/patient expects to be discharged to:: Private residence Living Arrangements: Other relatives (plans to d/c to sisters home.) Available Help at Discharge: Family Type of Home: House Home Access: Level entry     Home Layout: Two level Alternate Level Stairs-Number of Steps: 1 flight             Home Equipment: None          Prior Functioning/Environment Prior Level of Function : Independent/Modified Independent                        OT Problem List: Pain;Obesity      OT Treatment/Interventions: Self-care/ADL training;DME and/or AE instruction;Therapeutic activities;Patient/family education    OT Goals(Current goals can be found in the care plan section) Acute Rehab OT Goals Patient Stated Goal: be more independent OT Goal Formulation: With patient Time For Goal Achievement: 10/04/21 Potential to Achieve Goals: Good  OT Frequency: Min 2X/week   Barriers to D/C:            Co-evaluation              AM-PAC OT "6 Clicks" Daily Activity     Outcome Measure Help from another person eating meals?: None Help from another person taking care of personal grooming?: A Little Help from another person toileting, which includes using toliet, bedpan, or urinal?: A Lot Help from another person bathing  (including washing, rinsing, drying)?: A Lot Help from another person to put on and taking off regular upper body clothing?: A Little Help from another person to put on and taking off regular lower body clothing?: A Lot 6 Click Score: 16   End of Session Nurse Communication: Mobility status  Activity Tolerance: Patient tolerated treatment well Patient left: in chair;with call bell/phone within reach  OT Visit Diagnosis: Pain                Time: 1610-9604 OT Time Calculation (min): 35 min Charges:  OT General Charges $OT Visit: 1 Visit OT Evaluation $OT Eval Low Complexity: 1 Low OT Treatments $Self Care/Home Management : 8-22 mins  Waldron Session, OTR/L Acute Care Rehab Services  Office [phone] Pager: [phone]   Kelli Churn 09/20/2021, 1:08 PM

## 2021-09-20 NOTE — Progress Notes (Signed)
PROGRESS NOTE    Lori Mcknight  ZOX:[id number] DOB: [birth year] DOA: 09/11/2021 PCP: Claiborne Rigg, NP    Chief Complaint  Patient presents with   Abdominal Pain    Brief Narrative:  The patient is a 35 yr old woman who has had multiple recurrences of diverticulitis with abscess formation, depression anxiety, anemia, hypertension, hypothyroidism, PCOS, super morbid obesity presents this time with severe abdominal pain.  CT of the abdomen and pelvis demonstrated increase in the size of the abscess.  She was started on IV Zosyn and general surgery consulted. General surgery recommended IV antibiotics and clear sips. In view of her worsening abscesses and pneumoperitoneum, she is scheduled for robotic assisted sigmoid colectomy with cystoscopy with firefly injection today.      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 Active Problems:   Essential hypertension   Hypothyroid   Anemia   Obesity, morbid, BMI 50 or higher (HCC)   Endometriosis s/p ablation   Family history of colon cancer in father   Hypokalemia   Asthma, mild intermittent   Hypomagnesemia   Fecal peritonitis (HCC)   Intra-abdominal abscess s/p robotic drainage 09/18/2021   Colostomy in place [facility])   #1 acute diverticulitis of the large intestine with abscess formation/perforated sigmoid colon likely secondary to diverticulitis/feculent peritonitis with abscesses x3 -Patient noted to have recurrent episodes of acute diverticulitis. -Dr. Gerri Lins discussed with general surgery, Dr. Cliffton Asters, noting that abscess had increased in size however remained too small for percutaneous drainage. -Due to patient's body habitus it was felt patient was high risk for complications and having drain being able to reach the skin. -General surgery following initially recommended treatment with IV antibiotics and watchful waiting. -No abscess sitting between sigmoid  colon and bladder likelihood of fistula formation allowing for natural drainage of abscess. -Patient's condition continued to worsen with worsening pneumoperitoneum and interval development of free fluid and gas along with known segment of mid sigmoid diverticulitis. -Patient being followed by general surgery and subsequently underwent robotic assisted sigmoid colectomy with cystoscopy with firefly injection 09/17/2021. -Patient with no flatus, no bowel movements, complains of lower abdominal pain. -Per general surgery patient develops fever, worsening leukocytosis worsening abdominal pain low threshold for repeat CT abdomen and pelvis to rule out further abscess formation -Continue empiric IV antibiotics, IV fluids, pain management. -IV Eraxis added per general surgery. -Diet advanced to soft diet, patient with poor appetite. -Mobilize. -Management per general surgery.  2.  Staph epidermis bacteremia -Likely contamination. -Repeat blood cultures negative x5 days..  3.  Morbid obesity -Lifestyle changes.   -Outpatient follow-up with PCP.    4.  Hypertension -Follow.  Some bouts of elevated blood pressure likely secondary to pain. -Monitor  5.  Hypothyroidism -TSH obtained on 09/18/2021 was 15.46. -Patient stated had run out and had been off Synthroid for approximately a month. -Continue home regimen Synthroid 88 MCG's daily with outpatient follow-up and repeat TFTs in 4 to 6 weeks.  6.  Depression/anxiety -Not on any antidepressants on med rec. -Stable. -Outpatient follow-up.  7.  Hypomagnesemia -Magnesium at 1.9.   -Follow.  8.  Leukocytosis -Patient with a worsening leukocytosis postoperatively, likely reactive in the setting of acute diverticulitis with perforation.  -Leukocytosis slowly trending back down.   -Afebrile.   -Continue IV Zosyn, IV Eraxis.   -If patient develops fever, and worsening leukocytosis by general surgery low threshold for repeat CT abdomen and pelvis  to rule  out further abscess formation. -Follow.  9.  History of PCOS -Per general surgery during surgery no significant findings concerning for PCOS. -Outpatient follow-up with PCP.  10.  Chest tightness -Resolved.  11.  Hypokalemia -Repleted. -Potassium at 3.7. -Magnesium at 1.9.   DVT prophylaxis: Lovenox Code Status: Full Family Communication: Updated patient.  No family at bedside. Disposition:   Status is: Inpatient  Remains inpatient appropriate because: Severity of illness.       Consultants:  WOC RN: Cammie Mcgee, RN 09/18/2021 WOCN RN Lawson Fiscal McNicol's, RN 09/16/2021 General surgery: Dr. Cliffton Asters 09/11/2021  Procedures: Cystoscopy with bilateral ureteral catheterization and firefly injection per Dr.Machen, urology 09/17/2021 Robotic assisted sigmoid colectomy with end colostomy/mobilization of splenic flexure of colon/drainage of abscesses x3/robotic lysis of adhesions/transversus abdominis plane block bilaterally per Dr. Michaell Cowing, general surgery 09/17/2021 CT abdomen and pelvis 09/11/2021, 09/14/2021 2D echo 09/14/2021   Antimicrobials:  IV Eraxis 09/17/2021>>>>> 09/22/2021 IV Zosyn 09/11/2021>>>>> 09/22/2021   Subjective: Patient sitting up in recliner.  No chest pain.  No shortness of breath.  Patient with complaints of significant lower abdominal pain sharp pain radiating from the lower abdominal region.  Patient denies any flatus.  No BM.  States when she feels she needs to have a flatus she has significant pelvic pain.  Agreeable to work with physical therapy.  States has a low appetite.  Denies any nausea or emesis.   Objective: Vitals:   09/19/21 2046 09/19/21 2330 09/20/21 0332 09/20/21 0500  BP: (!) 156/104 (!) 157/102 (!) 131/101   Pulse: 90 91 94   Resp:   (!) 22   Temp:   97.9 F (36.6 C)   TempSrc:   Oral   SpO2:   98%   Weight:    (!) 174.8 kg  Height:        Intake/Output Summary (Last 24 hours) at 09/20/2021 1045 Last data filed at 09/20/2021  0700 Gross per 24 hour  Intake 170 ml  Output 250 ml  Net -80 ml    Filed Weights   09/17/21 1256 09/19/21 0500 09/20/21 0500  Weight: (!) 163.3 kg (!) 174.7 kg (!) 174.8 kg    Examination:  General exam: NAD Respiratory system: Lungs clear to auscultation bilaterally anterior lung fields.  No wheezes, no crackles, no rhonchi.  Fair air movement Cardiovascular system: RRR no murmurs rubs or gallops.  No JVD.  No lower extremity edema. Gastrointestinal system: Abdomen soft, nondistended, diffusely tender to palpation.  Hypoactive bowel sounds.  Colostomy with red stoma and serosanguineous drainage noted.  JP drain in right lower quadrant with sanguinous drainage. Central nervous system: Alert and oriented.  Moving extremities spontaneously.  No focal neurological deficits.   Extremities: Symmetric 5 x 5 power. Skin: No rashes, lesions or ulcers Psychiatry: Judgement and insight appear normal. Mood & affect appropriate.     Data Reviewed: I have personally reviewed following labs and imaging studies  CBC: Recent Labs  Lab 09/15/21 0533 09/17/21 0522 09/18/21 0539 09/19/21 0534 09/20/21 0647  WBC 10.9* 8.6 19.0* 14.8* 13.7*  NEUTROABS  --  5.1  --  11.5* 9.4*  HGB 14.6 13.6 15.4* 15.1* 13.3  HCT 44.0 41.4 46.0 45.5 41.6  MCV 95.2 95.8 94.7 96.8 97.9  PLT 197 193 249 251 226     Basic Metabolic Panel: Recent Labs  Lab 09/16/21 0850 09/16/21 1256 09/17/21 0522 09/18/21 0539 09/19/21 0534 09/20/21 0647  NA  --  136 136 135 132* 130*  K  --  3.9  3.6 3.8 4.0 3.7  CL  --  102 105 103 101 99  CO2  --  21* 22  GLUCOSE  --  106* 91 131* 128* 99  BUN  --  <5* <5* <5* <5* 7  CREATININE  --  0.77 0.60 0.71 0.76 0.70  CALCIUM  --  8.7* 8.9 8.5* 8.4* 8.3*  MG 1.9  --   --  1.5* 2.0 1.9  PHOS 2.9  --   --   --   --   --      GFR: Estimated Creatinine Clearance: 161.9 mL/min (by C-G formula based on SCr of 0.7 mg/dL).  Liver Function Tests: No results for  input(s): AST, ALT, ALKPHOS, BILITOT, PROT, ALBUMIN in the last 168 hours.  CBG: No results for input(s): GLUCAP in the last 168 hours.   Recent Results (from the past 240 hour(s))  Blood culture (routine x 2)     Status: Abnormal   Collection Time: 09/11/21  6:42 AM   Specimen: BLOOD  Result Value Ref Range Status   Specimen Description   Final    BLOOD BLOOD LEFT FOREARM Performed at [facility], [address], [place], [place] [address]    Special Requests   Final    Blood Culture adequate volume BOTTLES DRAWN AEROBIC AND ANAEROBIC Performed at [facility], [address] [place], [place] [address]    Culture  Setup Time   Final    GRAM POSITIVE COCCI IN BOTH AEROBIC AND ANAEROBIC BOTTLES CRITICAL RESULT CALLED TO, READ BACK BY AND VERIFIED WITH: PHARMD K.SHADE AT 1213 ON 09/12/2021 BY T.SAAD.    Culture (A)  Final    STAPHYLOCOCCUS EPIDERMIDIS THE SIGNIFICANCE OF ISOLATING THIS ORGANISM FROM A SINGLE VENIPUNCTURE CANNOT BE PREDICTED WITHOUT FURTHER CLINICAL AND CULTURE CORRELATION. SUSCEPTIBILITIES AVAILABLE ONLY ON REQUEST. Performed at [facility], [address] [place], [place] [address]    Report Status 09/14/2021 FINAL  Final  Blood Culture ID Panel (Reflexed)     Status: Abnormal   Collection Time: 09/11/21  6:42 AM  Result Value Ref Range Status   Enterococcus faecalis NOT DETECTED NOT DETECTED Final   Enterococcus Faecium NOT DETECTED NOT DETECTED Final   Listeria monocytogenes NOT DETECTED NOT DETECTED Final   Staphylococcus species DETECTED (A) NOT DETECTED Final    Comment: CRITICAL RESULT CALLED TO, READ BACK BY AND VERIFIED WITH: PHARMD K.SHADE AT 1213 ON 09/12/2021 BY T.SAAD.    Staphylococcus aureus (BCID) NOT DETECTED NOT DETECTED Final   Staphylococcus epidermidis DETECTED (A) NOT DETECTED Final    Comment: CRITICAL RESULT CALLED TO, READ BACK BY AND VERIFIED WITH: PHARMD K.SHADE AT 1213 ON 09/12/2021  BY T.SAAD.    Staphylococcus lugdunensis NOT DETECTED NOT DETECTED Final   Streptococcus species NOT DETECTED NOT DETECTED Final   Streptococcus agalactiae NOT DETECTED NOT DETECTED Final   Streptococcus pneumoniae NOT DETECTED NOT DETECTED Final   Streptococcus pyogenes NOT DETECTED NOT DETECTED Final   A.calcoaceticus-baumannii NOT DETECTED NOT DETECTED Final   Bacteroides fragilis NOT DETECTED NOT DETECTED Final   Enterobacterales NOT DETECTED NOT DETECTED Final   Enterobacter cloacae complex NOT DETECTED NOT DETECTED Final   Escherichia coli NOT DETECTED NOT DETECTED Final   Klebsiella aerogenes NOT DETECTED NOT DETECTED Final   Klebsiella oxytoca NOT DETECTED NOT DETECTED Final   Klebsiella pneumoniae NOT DETECTED NOT DETECTED Final   Proteus species NOT DETECTED NOT DETECTED Final   Salmonella species NOT DETECTED NOT  DETECTED Final   Serratia marcescens NOT DETECTED NOT DETECTED Final   Haemophilus influenzae NOT DETECTED NOT DETECTED Final   Neisseria meningitidis NOT DETECTED NOT DETECTED Final   Pseudomonas aeruginosa NOT DETECTED NOT DETECTED Final   Stenotrophomonas maltophilia NOT DETECTED NOT DETECTED Final   Candida albicans NOT DETECTED NOT DETECTED Final   Candida auris NOT DETECTED NOT DETECTED Final   Candida glabrata NOT DETECTED NOT DETECTED Final   Candida krusei NOT DETECTED NOT DETECTED Final   Candida parapsilosis NOT DETECTED NOT DETECTED Final   Candida tropicalis NOT DETECTED NOT DETECTED Final   Cryptococcus neoformans/gattii NOT DETECTED NOT DETECTED Final   Methicillin resistance mecA/C NOT DETECTED NOT DETECTED Final    Comment: Performed at [facility], [address], [place], [place] [address]  Resp Panel by RT-PCR (Flu A&B, Covid) Nasopharyngeal Swab     Status: None   Collection Time: 09/11/21 10:45 AM   Specimen: Nasopharyngeal Swab; Nasopharyngeal(NP) swabs in vial transport medium  Result Value Ref Range Status   SARS Coronavirus 2 by  RT PCR NEGATIVE NEGATIVE Final    Comment: (NOTE) SARS-CoV-2 target nucleic acids are NOT DETECTED.  The SARS-CoV-2 RNA is generally detectable in upper respiratory specimens during the acute phase of infection. The lowest concentration of SARS-CoV-2 viral copies this assay can detect is 138 copies/mL. A negative result does not preclude SARS-Cov-2 infection and should not be used as the sole basis for treatment or other patient management decisions. A negative result may occur with  improper specimen collection/handling, submission of specimen other than nasopharyngeal swab, presence of viral mutation(s) within the areas targeted by this assay, and inadequate number of viral copies(<138 copies/mL). A negative result must be combined with clinical observations, patient history, and epidemiological information. The expected result is Negative.  Fact Sheet for Patients:  BloggerCourse.com  Fact Sheet for Healthcare Providers:  SeriousBroker.it  This test is no t yet approved or cleared by the [place] FDA and  has been authorized for detection and/or diagnosis of SARS-CoV-2 by FDA under an Emergency Use Authorization (EUA). This EUA will remain  in effect (meaning this test can be used) for the duration of the COVID-19 declaration under Section 564(b)(1) of the Act, 21 U.S.C.section 360bbb-3(b)(1), unless the authorization is terminated  or revoked sooner.       Influenza A by PCR NEGATIVE NEGATIVE Final   Influenza B by PCR NEGATIVE NEGATIVE Final    Comment: (NOTE) The Xpert Xpress SARS-CoV-2/FLU/RSV plus assay is intended as an aid in the diagnosis of influenza from Nasopharyngeal swab specimens and should not be used as a sole basis for treatment. Nasal washings and aspirates are unacceptable for Xpert Xpress SARS-CoV-2/FLU/RSV testing.  Fact Sheet for Patients: BloggerCourse.com  Fact Sheet  for Healthcare Providers: SeriousBroker.it  This test is not yet approved or cleared by the [place] FDA and has been authorized for detection and/or diagnosis of SARS-CoV-2 by FDA under an Emergency Use Authorization (EUA). This EUA will remain in effect (meaning this test can be used) for the duration of the COVID-19 declaration under Section 564(b)(1) of the Act, 21 U.S.C. section 360bbb-3(b)(1), unless the authorization is terminated or revoked.  Performed at [facility], [address] [place], [place] [address]   Blood culture (routine x 2)     Status: Abnormal   Collection Time: 09/11/21  3:52 PM   Specimen: BLOOD RIGHT HAND  Result Value Ref Range Status   Specimen Description  Final    BLOOD RIGHT HAND Performed at [facility], [address], [place], [place] [address]    Special Requests   Final    BOTTLES DRAWN AEROBIC AND ANAEROBIC Blood Culture adequate volume Performed at [facility], [address] [place], [place] [address]    Culture  Setup Time   Final    GRAM POSITIVE COCCI IN CLUSTERS ANAEROBIC BOTTLE ONLY CRITICAL RESULT CALLED TO, READ BACK BY AND VERIFIED WITH: Damaris Hippo PHARMD, AT 2956 09/13/21 D. VANHOOK    Culture (A)  Final    STAPHYLOCOCCUS HOMINIS THE SIGNIFICANCE OF ISOLATING THIS ORGANISM FROM A SINGLE SET OF BLOOD CULTURES WHEN MULTIPLE SETS ARE DRAWN IS UNCERTAIN. PLEASE NOTIFY THE MICROBIOLOGY DEPARTMENT WITHIN ONE WEEK IF SPECIATION AND SENSITIVITIES ARE REQUIRED. Performed at [facility], [address] [place], [place] [address]    Report Status 09/15/2021 FINAL  Final  Culture, blood (Routine X 2) w Reflex to ID Panel     Status: None   Collection Time: 09/11/21  5:14 PM   Specimen: BLOOD  Result Value Ref Range Status   Specimen Description   Final    BLOOD BLOOD LEFT HAND Performed at [facility], [address], [place], [place] [address]    Special Requests   Final    BOTTLES DRAWN AEROBIC ONLY Blood Culture results may not be optimal due to an inadequate volume of blood received in culture bottles Performed at [facility], [address], [place], [place] [address]    Culture   Final    NO GROWTH 5 DAYS Performed at [facility], [address], [place], [place] [address]    Report Status 09/17/2021 FINAL  Final  Culture, blood (Routine X 2) w Reflex to ID Panel     Status: None   Collection Time: 09/14/21 12:40 PM   Specimen: BLOOD  Result Value Ref Range Status   Specimen Description   Final    BLOOD RIGHT ANTECUBITAL Performed at [facility], [address], [place], [place] [address]    Special Requests   Final    BOTTLES DRAWN AEROBIC ONLY Blood Culture adequate volume Performed at [facility], [address], [place], [place] [address]    Culture   Final    NO GROWTH 5 DAYS Performed at [facility], [address], [place], [place] [address]    Report Status 09/19/2021 FINAL  Final  Culture, blood (Routine X 2) w Reflex to ID Panel     Status: None   Collection Time: 09/14/21 12:40 PM   Specimen: BLOOD  Result Value Ref Range Status   Specimen Description   Final    BLOOD BLOOD LEFT HAND Performed at [facility], [address] [place], [place] [address]    Special Requests   Final    BOTTLES DRAWN AEROBIC ONLY Blood Culture adequate volume Performed at [facility], [address] [place], [place] [address]    Culture   Final    NO GROWTH 5 DAYS Performed at [facility], [address], [place], [place] [address]    Report Status 09/19/2021 FINAL  Final          Radiology Studies: No results found.      Scheduled Meds:  acetaminophen  1,000 mg Oral Q6H   alvimopan  12 mg Oral BID  cyclobenzaprine  10 mg Oral TID   enoxaparin  (LOVENOX) injection  40 mg Subcutaneous Q12H   feeding supplement  237 mL Oral BID BM   gabapentin  400 mg Oral TID   [START ON 09/21/2021] levothyroxine  100 mcg Oral QAC breakfast   medroxyPROGESTERone  10 mg Oral Daily   pantoprazole  40 mg Oral Daily   potassium chloride  40 mEq Oral Daily   Continuous Infusions:  anidulafungin 100 mg (09/19/21 1449)   piperacillin-tazobactam (ZOSYN)  IV 3.375 g (09/20/21 0831)     LOS: 9 days    Time spent: 35 minutes    Ramiro Harvest, MD [facility]   To contact the attending provider between 7A-7P or the covering provider during after hours 7P-7A, please log into the web site [email or web address] and access using universal [place] password for that web site. If you do not have the password, please call the hospital operator.  09/20/2021, 10:45 AM

## 2021-09-20 NOTE — Consult Note (Signed)
WOC Nurse ostomy follow-up consult note Pt states she has attempted to reach her sister 3 days in a row to attend a teaching session, but she is unable to be present.  Stoma type/location: Stoma is red and viable, slightly above skin level, 2 inches.  Small amt brown liquid stool in the pouch Peristomal assessment: intact Ostomy pouching: 1pc. Education provided:  Demonstrated pouch change using a hand held mirror.  Pt watched the process and asked appropriate questions.  Use barrier ring, Lawson # [id number] and flexible convex pouches next time; Hart [place] # [id number] Pt was able to open and close velcro to empty.  Discussed ordering supplies and pouching routines.  Educational materials left in the room with 5 sets of barrier rings and flexible convex pouches. WOC team wil see again on Mon for a further teaching session. Enrolled patient in [facility] DC program: Yes Cammie Mcgee MSN, RN, [place], [place], [place] [phone]

## 2021-09-20 NOTE — Progress Notes (Signed)
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 Pt s/p robotic colectomy/colostomy    PT Comments    Pt ambulated in hallway, used BSC and then returned to bed.  Pt reports pain limiting and was a little dizzy with ambulating requiring steadying assist however anticipate pt to progress well.  Pt requesting pain meds end of session and RN notified.    Recommendations for follow up therapy are one component of a multi-disciplinary discharge planning process, led by the attending physician.  Recommendations may be updated based on patient status, additional functional criteria and insurance authorization.  Follow Up Recommendations  No PT follow up     Assistance Recommended at Discharge Intermittent Supervision/Assistance  Equipment Recommendations  BSC/3in1    Recommendations for Other Services       Precautions / Restrictions Precautions Precautions: Other (comment);Fall Precaution Comments: colostomy, drain Restrictions Weight Bearing Restrictions: No     Mobility  Bed Mobility Overal bed mobility: Needs Assistance Bed Mobility: Supine to Sit;Sit to Supine     Supine to sit: Min assist;HOB elevated Sit to supine: Min assist;HOB elevated   General bed mobility comments: cued for log roll technique however does not perform this way; assist for trunk upright and LE onto bed    Transfers Overall transfer level: Needs assistance Equipment used: None Transfers: Sit to/from Stand Sit to Stand: Min guard;Min assist           General transfer comment: min/guard initially however min with assisting pt off BSC and back to bed due to  fatigue and pain    Ambulation/Gait Ambulation/Gait assistance: Min assist;+2 safety/equipment Gait Distance (Feet): 120 Feet Assistive device: None;1 person hand held assist Gait Pattern/deviations: Step-through pattern;Decreased stride length Gait velocity: decreased     General Gait Details: pt reports some dizziness however does not feel faint, pt requiring some UE support for stabilizing, pt requiring frequent standing rest breaks due to abdominal pain so requested NT join ambulation for safely returning pt to room   Stairs             Wheelchair Mobility    Modified Rankin (Stroke Patients Only)       Balance Overall balance assessment: Mild deficits observed, not formally tested (possibly from pain and medications)                                          Cognition Arousal/Alertness: Awake/alert Behavior During Therapy: WFL for tasks assessed/performed Overall Cognitive Status: Within Functional Limits for tasks assessed                                          Exercises      General Comments        Pertinent Vitals/Pain Pain Assessment: Faces Faces Pain Scale: Hurts whole lot Pain Location: abdomen Pain Descriptors / Indicators: Aching;Grimacing;Sore;Guarding Pain Intervention(s): Monitored during session;Repositioned;Patient requesting pain meds-RN notified    Home Living Family/patient expects to be discharged to:: Private residence Living Arrangements: Other relatives (plans to d/c  to sisters home.) Available Help at Discharge: Family Type of Home: House Home Access: Level entry     Alternate Level Stairs-Number of Steps: 1 flight Home Layout: Two level Home Equipment: None      Prior Function            PT Goals (current goals can now be found in the care plan section) Progress towards PT goals: Progressing toward goals    Frequency    Min 3X/week      PT Plan Current plan remains  appropriate    Co-evaluation              AM-PAC PT "6 Clicks" Mobility   Outcome Measure  Help needed turning from your back to your side while in a flat bed without using bedrails?: A Little Help needed moving from lying on your back to sitting on the side of a flat bed without using bedrails?: A Little Help needed moving to and from a bed to a chair (including a wheelchair)?: A Little Help needed standing up from a chair using your arms (e.g., wheelchair or bedside chair)?: A Little Help needed to walk in hospital room?: A Little Help needed climbing 3-5 steps with a railing? : A Lot 6 Click Score: 17    End of Session   Activity Tolerance: Patient tolerated treatment well Patient left: in bed;with call bell/phone within reach Nurse Communication: Mobility status PT Visit Diagnosis: Difficulty in walking, not elsewhere classified (R26.2)     Time: 5379-4327 PT Time Calculation (min) (ACUTE ONLY): 17 min  Charges:  $Gait Training: 8-22 mins                    Paulino Door, DPT Acute Rehabilitation Services Pager: [phone] Office: [phone]    [name] 09/20/2021, 4:13 PM

## 2021-09-20 NOTE — TOC Initial Note (Signed)
Transition of Care [facility]) - Initial/Assessment Note    Patient Details  Name: Lori Mcknight MRN: [id number] Date of Birth: 05/07/1986  [facility] CM/SW Contact:    Ida Rogue, LCSW Phone Number: 09/20/2021, 3:11 PM  Clinical Narrative:    Patient seen re: home needs at d/c.  [name] confirms she will be going to stay with sister at d/c.  Confirmed her need for bariatric 3 in 1 and possibly RW.   I let her know that we would try to get her set up with charity [facility] RN.  If she is here on Tuesday, it will be the first day of a new agencies' week, and with a little luck they will still have RN slots that we can use. She voiced understanding.  Contacted Genvieve at [facility] for charity 3 in 1.  She has none in stock, but will have one on Monday to deliver to patient's room. TOC will continue to follow during the course of hospitalization.           Expected Discharge Plan: Home w Home Health Services Barriers to Discharge: No Barriers Identified   Patient Goals and CMS Choice     Choice offered to / list presented to : Patient  Expected Discharge Plan and Services Expected Discharge Plan: Home w Home Health Services   Discharge Planning Services: CM Consult Post Acute Care Choice: Home Health Living arrangements for the past 2 months: Apartment                                      Prior Living Arrangements/Services Living arrangements for the past 2 months: Apartment Lives with:: Self Patient language and need for interpreter reviewed:: Yes        Need for Family Participation in Patient Care: Yes (Comment) Care giver support system in place?: Yes (comment)   Criminal Activity/Legal Involvement Pertinent to Current Situation/Hospitalization: No - Comment as needed  Activities of Daily Living Home Assistive Devices/Equipment: Eyeglasses ADL Screening (condition at time of admission) Patient's cognitive ability adequate to safely complete daily  activities?: Yes Is the patient deaf or have difficulty hearing?: No Does the patient have difficulty seeing, even when wearing glasses/contacts?: No Does the patient have difficulty concentrating, remembering, or making decisions?: No Patient able to express need for assistance with ADLs?: Yes Does the patient have difficulty dressing or bathing?: No Independently performs ADLs?: Yes (appropriate for developmental age) Does the patient have difficulty walking or climbing stairs?: Yes Weakness of Legs: None Weakness of Arms/Hands: None  Permission Sought/Granted                  Emotional Assessment Appearance:: Appears stated age Attitude/Demeanor/Rapport: Engaged Affect (typically observed): Appropriate Orientation: : Oriented to Self, Oriented to Place, Oriented to  Time, Oriented to Situation Alcohol / Substance Use: Not Applicable Psych Involvement: No (comment)  Admission diagnosis:  Diverticulitis of large intestine with abscess without bleeding [K57.20] Diverticulitis of large intestine with abscess [K57.20] Perforation of sigmoid colon due to diverticulitis [K57.20] Patient Active Problem List   Diagnosis Date Noted   Hypomagnesemia 09/18/2021   Fecal peritonitis (HCC) 09/18/2021   Intra-abdominal abscess s/p robotic drainage 09/18/2021 09/18/2021   Colostomy in place [facility]) 09/18/2021   Generalized abdominal pain 05/09/2021   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 05/09/2021   Hypokalemia 05/09/2021   Asthma, mild intermittent 05/09/2021  Obesity, morbid, BMI 50 or higher (HCC) 08/08/2019   Endometriosis s/p ablation 08/08/2019   Dysuria 08/08/2019   Family history of colon cancer in father 08/08/2019   Incomplete bladder emptying 02/18/2018   DUB (dysfunctional uterine bleeding) 02/18/2018   Anemia 02/18/2018   Hypothyroid 01/29/2017   Migraines 08/26/2016   Chest pain 02/04/2016   Abnormal uterine bleeding (AUB) 01/09/2014    Thickened endometrium 12/02/2013   Essential hypertension 10/21/2013   Infertility, female 11/06/2011   Pelvic pain in female 08/01/2011   PCP:  Claiborne Rigg, NP Pharmacy:   [id number] - [place], [place] - [address] [place] [address] Phone: [phone] Fax: [phone]     Social Determinants of Health (SDOH) Interventions    Readmission Risk Interventions No flowsheet data found.

## 2021-09-20 NOTE — Plan of Care (Signed)
Pt has rested some during overnight. PRN given 3 doses of morphine, 1 tramadol and 1 dose of zofran. Pt has ambulated to BR once and bsc x 2. Drain dsg changed. Colostomy noted to have pink bloody drainage BS hypoactive.  Problem: Education: Goal: Knowledge of General Education information will improve Description: Including pain rating scale, medication(s)/side effects and non-pharmacologic comfort measures Outcome: Progressing   Problem: Health Behavior/Discharge Planning: Goal: Ability to manage health-related needs will improve Outcome: Progressing   Problem: Clinical Measurements: Goal: Ability to maintain clinical measurements within normal limits will improve Outcome: Progressing Goal: Will remain free from infection Outcome: Progressing Goal: Diagnostic test results will improve Outcome: Progressing Goal: Respiratory complications will improve Outcome: Progressing Goal: Cardiovascular complication will be avoided Outcome: Progressing   Problem: Activity: Goal: Risk for activity intolerance will decrease Outcome: Progressing   Problem: Nutrition: Goal: Adequate nutrition will be maintained Outcome: Progressing   Problem: Coping: Goal: Level of anxiety will decrease Outcome: Progressing   Problem: Elimination: Goal: Will not experience complications related to bowel motility Outcome: Progressing Goal: Will not experience complications related to urinary retention Outcome: Progressing   Problem: Pain Managment: Goal: General experience of comfort will improve Outcome: Progressing   Problem: Safety: Goal: Ability to remain free from injury will improve Outcome: Progressing   Problem: Skin Integrity: Goal: Risk for impaired skin integrity will decrease Outcome: Progressing

## 2021-09-20 NOTE — Progress Notes (Signed)
RN removed JP drain per MD Dr. Michaell Cowing orders. Pt had 10/10 pain after JP removal. Administered prn Morphine. Pt resting comfortably after administration of pain medication.

## 2021-09-21 LAB — RENAL FUNCTION PANEL
Albumin: 3.2 g/dL — ABNORMAL LOW (ref 3.5–5.0)
Anion gap: 9 (ref 5–15)
BUN: 6 mg/dL (ref 6–20)
CO2: 25 mmol/L (ref 22–32)
Calcium: 8.4 mg/dL — ABNORMAL LOW (ref 8.9–10.3)
Chloride: 99 mmol/L (ref 98–111)
Creatinine, Ser: 0.67 mg/dL (ref 0.44–1.00)
GFR, Estimated: >60 mL/min (ref >60)
Glucose, Bld: 99 mg/dL (ref 70–99)
Phosphorus: 2.7 mg/dL (ref 2.5–4.6)
Potassium: 3.1 mmol/L — ABNORMAL LOW (ref 3.5–5.1)
Sodium: 133 mmol/L — ABNORMAL LOW (ref 135–145)

## 2021-09-21 LAB — CBC WITH DIFFERENTIAL/PLATELET
Abs Immature Granulocytes: 0.35 10*3/uL — ABNORMAL HIGH (ref 0.00–0.07)
Basophils Absolute: 0.1 10*3/uL (ref 0.0–0.1)
Basophils Relative: 1 %
Eosinophils Absolute: 0.3 10*3/uL (ref 0.0–0.5)
Eosinophils Relative: 2 %
HCT: 42.6 % (ref 36.0–46.0)
Hemoglobin: 13.7 g/dL (ref 12.0–15.0)
Immature Granulocytes: 3 %
Lymphocytes Relative: 19 %
Lymphs Abs: 2.2 10*3/uL (ref 0.7–4.0)
MCH: 31.3 pg (ref 26.0–34.0)
MCHC: 32.2 g/dL (ref 30.0–36.0)
MCV: 97.3 fL (ref 80.0–100.0)
Monocytes Absolute: 0.8 10*3/uL (ref 0.1–1.0)
Monocytes Relative: 7 %
Neutro Abs: 7.7 10*3/uL (ref 1.7–7.7)
Neutrophils Relative %: 68 %
Platelets: 240 10*3/uL (ref 150–400)
RBC: 4.38 MIL/uL (ref 3.87–5.11)
RDW: 12.2 % (ref 11.5–15.5)
WBC: 11.4 10*3/uL — ABNORMAL HIGH (ref 4.0–10.5)
nRBC: 0 % (ref 0.0–0.2)

## 2021-09-21 LAB — MAGNESIUM: Magnesium: 1.9 mg/dL (ref 1.7–2.4)

## 2021-09-21 MED ORDER — POTASSIUM CHLORIDE CRYS ER 20 MEQ PO TBCR
40.0000 meq | EXTENDED_RELEASE_TABLET | Freq: Once | ORAL | Status: AC
  Administered 2021-09-21: 40 meq via ORAL
  Filled 2021-09-21: qty 2

## 2021-09-21 NOTE — Progress Notes (Signed)
4 Days Post-Op   Subjective/Chief Complaint: Incisional pain is slowly improving Tolerating a diet but appetite is poor per her report No fevers last 24 hours   Objective: Vital signs in last 24 hours: Temp:  [98.3 F (36.8 C)-98.4 F (36.9 C)] 98.3 F (36.8 C) (12/10 0545) Pulse Rate:  [88-93] 93 (12/10 0545) Resp:  [17-28] 17 (12/10 0545) BP: (146-149)/(89-94) 146/89 (12/10 0545) SpO2:  [93 %-98 %] 93 % (12/10 0545) Last BM Date: 09/04/21 (blood in bag)  Intake/Output from previous day: 12/09 0701 - 12/10 0700 In: 1062 [P.O.:1062] Out: 1840 [Urine:1750; Drains:40; Stool:50] Intake/Output this shift: No intake/output data recorded.  Exam: Awake and alert Abdomen is soft and obese.  Ostomy is pink.  There is nothing in the bag this morning.  Lab Results:  Recent Labs    09/20/21 0647 09/21/21 0606  WBC 13.7* 11.4*  HGB 13.3 13.7  HCT 41.6 42.6  PLT 226 240   BMET Recent Labs    09/20/21 0647 09/21/21 0606  NA 130* 133*  K 3.7 3.1*  CL 99 99  CO2 22 25  GLUCOSE 99 99  BUN 7 6  CREATININE 0.70 0.67  CALCIUM 8.3* 8.4*   PT/INR No results for input(s): LABPROT, INR in the last 72 hours. ABG No results for input(s): PHART, HCO3 in the last 72 hours.  Invalid input(s): PCO2, PO2  Studies/Results: No results found.  Anti-infectives: Anti-infectives (From admission, onward)    Start     Dose/Rate Route Frequency Ordered Stop   09/18/21 1400  anidulafungin (ERAXIS) 100 mg in sodium chloride 0.9 % 100 mL IVPB        100 mg 78 mL/hr over 100 Minutes Intravenous Every 24 hours 09/17/21 2117 09/22/21 1359   09/18/21 0000  piperacillin-tazobactam (ZOSYN) IVPB 3.375 g        3.375 g 12.5 mL/hr over 240 Minutes Intravenous Every 8 hours 09/17/21 2117 09/22/21 2359   09/17/21 1830  clindamycin (CLEOCIN) 900 mg, gentamicin (GARAMYCIN) 240 mg in sodium chloride 0.9 % 1,000 mL for intraperitoneal lavage  Status:  Discontinued          As needed 09/17/21 1850  09/17/21 2117   09/17/21 1500  clindamycin (CLEOCIN) 900 mg, gentamicin (GARAMYCIN) 240 mg in sodium chloride 0.9 % 1,000 mL for intraperitoneal lavage  Status:  Discontinued       Note to Pharmacy: Have in the  OR room for final irrigation in bowel surgery case to minimize risk of abscess/infection Pharmacy may adjust dosing strength, schedule, rate of infusion, etc as needed to optimize therapy    Irrigation To Surgery 09/17/21 1448 09/17/21 2117   09/17/21 1400  anidulafungin (ERAXIS) 100 mg in sodium chloride 0.9 % 100 mL IVPB  Status:  Discontinued        100 mg 78 mL/hr over 100 Minutes Intravenous Every 24 hours 09/16/21 1352 09/17/21 2117   09/17/21 0600  cefoTEtan (CEFOTAN) 2 g in sodium chloride 0.9 % 100 mL IVPB        2 g 200 mL/hr over 30 Minutes Intravenous On call to O.R. 09/16/21 0835 09/17/21 1513   09/16/21 1500  anidulafungin (ERAXIS) 200 mg in sodium chloride 0.9 % 200 mL IVPB        200 mg 78 mL/hr over 200 Minutes Intravenous  Once 09/16/21 1400 09/16/21 2338   09/16/21 1400  neomycin (MYCIFRADIN) tablet 1,000 mg  Status:  Discontinued       See Hyperspace for full Linked  Orders Report.   1,000 mg Oral 3 times per day 09/16/21 0835 09/16/21 1107   09/16/21 1400  metroNIDAZOLE (FLAGYL) tablet 1,000 mg       See Hyperspace for full Linked Orders Report.   1,000 mg Oral 3 times per day 09/16/21 0835 09/16/21 2008   09/16/21 1247  neomycin (MYCIFRADIN) tablet 500 mg  Status:  Discontinued        500 mg Oral As directed 09/16/21 1247 09/16/21 1254   09/11/21 1600  piperacillin-tazobactam (ZOSYN) IVPB 3.375 g  Status:  Discontinued        3.375 g 12.5 mL/hr over 240 Minutes Intravenous Every 8 hours 09/11/21 1538 09/17/21 2117   09/11/21 0645  piperacillin-tazobactam (ZOSYN) IVPB 3.375 g        3.375 g 100 mL/hr over 30 Minutes Intravenous  Once 09/11/21 0634 09/11/21 0729       Assessment/Plan: s/p Procedure(s): XI ROBOT ASSISTED SIGMOID COLECTOMY WITH END COLOSTOMY  AND BILATERAL TAP BLOCK, LYSIS OF ADHESIONS, DRAINAGE OF ABSCESSES X3 (N/A) CYSTOSCOPY with FIREFLY INJECTION (N/A)  POD#4  WBC continues to improve.  Continuing current care with antibiotics which can be stopped on postop day 5 Continue to ambulate and encourage p.o. Ostomy training   LOS: 10 days    Abigail Miyamoto MD 09/21/2021

## 2021-09-21 NOTE — Progress Notes (Signed)
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  CT of the abdomen and pelvis demonstrated increase in the size of the abscess.  She was started on IV Zosyn and general surgery consulted. General surgery recommended IV antibiotics and clear sips. In view of her worsening abscesses and pneumoperitoneum, she is scheduled for robotic assisted sigmoid colectomy with cystoscopy with firefly injection today.      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 Active Problems:   Essential hypertension   Hypothyroid   Anemia   Obesity, morbid, BMI 50 or higher (HCC)   Endometriosis s/p ablation   Family history of colon cancer in father   Hypokalemia   Asthma, mild intermittent   Hypomagnesemia   Fecal peritonitis (HCC)   Intra-abdominal abscess s/p robotic drainage 09/18/2021   Colostomy in place [facility])   #1 acute diverticulitis of the large intestine with abscess formation/perforated sigmoid colon likely secondary to diverticulitis/feculent peritonitis with abscesses x3 -Patient noted to have recurrent episodes of acute diverticulitis. -Dr. Gerri Lins discussed with general surgery, Dr. Cliffton Asters, noting that abscess had increased in size however remained too small for percutaneous drainage. -Due to patient's body habitus it was felt patient was high risk for complications and having drain being able to reach the skin. -General surgery following initially recommended treatment with IV antibiotics and watchful waiting. -No abscess sitting between sigmoid  colon and bladder likelihood of fistula formation allowing for natural drainage of abscess. -Patient's condition continued to worsen with worsening pneumoperitoneum and interval development of free fluid and gas along with known segment of mid sigmoid diverticulitis. -Patient being followed by general surgery and subsequently underwent robotic assisted sigmoid colectomy with cystoscopy with firefly injection 09/17/2021. -Patient with no flatus, no bowel movements, complains of lower abdominal pain. -Per general surgery patient develops fever, worsening leukocytosis worsening abdominal pain low threshold for repeat CT abdomen and pelvis to rule out further abscess formation -Continue empiric IV antibiotics, IV fluids, pain management. -IV Eraxis added per general surgery. -Diet advanced to soft diet, patient with poor appetite. -Mobilize. -Management per general surgery.  2.  Staph epidermis bacteremia -Likely contamination. -Repeat blood cultures negative x5 days..  3.  Morbid obesity -Lifestyle changes.   -Outpatient follow-up with PCP.    4.  Hypertension -Follow.  Some bouts of elevated blood pressure likely secondary to pain. -Monitor  5.  Hypothyroidism -TSH obtained on 09/18/2021 was 15.46. -Patient stated had run out and had been off Synthroid for approximately a month. -Continue home regimen Synthroid 88 MCG's daily with outpatient follow-up and repeat TFTs in 4 to 6 weeks.  6.  Depression/anxiety -Not on any antidepressants on med rec. -Stable. -Outpatient follow-up.  7.  Hypomagnesemia -Magnesium at 1.9.   -Follow.  8.  Leukocytosis -Patient with a worsening leukocytosis postoperatively, likely reactive in the setting of acute diverticulitis with perforation.  -Leukocytosis slowly trending back down with IV antibiotics.   -Afebrile.   -Continue IV Zosyn, IV Eraxis.   -If patient develops fever, and worsening leukocytosis by general surgery low threshold for repeat CT  abdomen and  pelvis to rule out further abscess formation. -Follow.  9.  History of PCOS -Per general surgery during surgery no significant findings concerning for PCOS. -Outpatient follow-up with PCP.  10.  Chest tightness -Resolved.  11.  Hypokalemia -Potassium at 3.1. -KDur x 1.  -Magnesium at 1.9.   DVT prophylaxis: Lovenox Code Status: Full Family Communication: Updated patient.  No family at bedside. Disposition:   Status is: Inpatient  Remains inpatient appropriate because: Severity of illness.       Consultants:  WOC RN: Cammie Mcgee, RN 09/18/2021 WOCN RN Lawson Fiscal McNicol's, RN 09/16/2021 General surgery: Dr. Cliffton Asters 09/11/2021  Procedures: Cystoscopy with bilateral ureteral catheterization and firefly injection per Dr.Machen, urology 09/17/2021 Robotic assisted sigmoid colectomy with end colostomy/mobilization of splenic flexure of colon/drainage of abscesses x3/robotic lysis of adhesions/transversus abdominis plane block bilaterally per Dr. Michaell Cowing, general surgery 09/17/2021 CT abdomen and pelvis 09/11/2021, 09/14/2021 2D echo 09/14/2021   Antimicrobials:  IV Eraxis 09/17/2021>>>>> 09/22/2021 IV Zosyn 09/11/2021>>>>> 09/22/2021   Subjective: Laying in bed.  More alert and feels a little bit better.  Still with some complaints of abdominal pain.  Stated tried diet this morning had some abdominal pain with it.  No nausea or emesis.  Afebrile.  Overall slowly feeling better.  Minimal stool output noted via colostomy per RN.    Objective: Vitals:   09/20/21 0500 09/20/21 1325 09/20/21 1925 09/21/21 0545  BP:  (!) 149/94 (!) 147/92 (!) 146/89  Pulse:  88 93 93  Resp:  (!) 28 18 17   Temp:  98.4 F (36.9 C) 98.3 F (36.8 C) 98.3 F (36.8 C)  TempSrc:  Oral Oral Oral  SpO2:  98% 96% 93%  Weight: (!) 174.8 kg     Height:        Intake/Output Summary (Last 24 hours) at 09/21/2021 1107 Last data filed at 09/21/2021 0932 Gross per 24 hour  Intake 1076 ml   Output 1350 ml  Net -274 ml    Filed Weights   09/17/21 1256 09/19/21 0500 09/20/21 0500  Weight: (!) 163.3 kg (!) 174.7 kg (!) 174.8 kg    Examination:  General exam: NAD Respiratory system: CTA B anterior lung fields.  No wheezes, no crackles, no rhonchi.  Fair air movement.  Speaking in full sentences.  Cardiovascular system: Regular rate rhythm no murmurs rubs or gallops.  No JVD.  No lower extremity edema. Gastrointestinal system: Abdomen is soft, nondistended, decreasing diffuse tenderness to palpation, positive bowel sounds.  Colostomy with red stoma with some serosanguineous drainage noted.  JP drain has been removed.  Central nervous system: Alert and oriented.  Moving extremities spontaneously.  No focal neurological deficits.   Extremities: Symmetric 5 x 5 power. Skin: No rashes, lesions or ulcers Psychiatry: Judgement and insight appear normal. Mood & affect appropriate.     Data Reviewed: I have personally reviewed following labs and imaging studies  CBC: Recent Labs  Lab 09/17/21 0522 09/18/21 0539 09/19/21 0534 09/20/21 0647 09/21/21 0606  WBC 8.6 19.0* 14.8* 13.7* 11.4*  NEUTROABS 5.1  --  11.5* 9.4* 7.7  HGB 13.6 15.4* 15.1* 13.3 13.7  HCT 41.4 46.0 45.5 41.6 42.6  MCV 95.8 94.7 96.8 97.9 97.3  PLT 193 249 251 226 240     Basic Metabolic Panel: Recent Labs  Lab 09/16/21 0850 09/16/21 1256 09/17/21 0522 09/18/21 0539 09/19/21 0534 09/20/21 0647 09/21/21 0606  NA  --    < > 136 135 132* 130* 133*  K  --    < >  3.6 3.8 4.0 3.7 3.1*  CL  --    < > 105 103 101 99 99  CO2  --    < > 24 23 21* 22 25  GLUCOSE  --    < > 91 131* 128* 99 99  BUN  --    < > <5* <5* <5* 7 6  CREATININE  --    < > 0.60 0.71 0.76 0.70 0.67  CALCIUM  --    < > 8.9 8.5* 8.4* 8.3* 8.4*  MG 1.9  --   --  1.5* 2.0 1.9 1.9  PHOS 2.9  --   --   --   --   --  2.7   < > = values in this interval not displayed.     GFR: Estimated Creatinine Clearance: 161.9 mL/min (by C-G  formula based on SCr of 0.67 mg/dL).  Liver Function Tests: Recent Labs  Lab 09/21/21 0606  ALBUMIN 3.2*    CBG: No results for input(s): GLUCAP in the last 168 hours.   Recent Results (from the past 240 hour(s))  Blood culture (routine x 2)     Status: Abnormal   Collection Time: 09/11/21  3:52 PM   Specimen: BLOOD RIGHT HAND  Result Value Ref Range Status   Specimen Description   Final    BLOOD RIGHT HAND Performed at [facility], [address], [place], [place] [address]    Special Requests   Final    BOTTLES DRAWN AEROBIC AND ANAEROBIC Blood Culture adequate volume Performed at [facility], [address] [place], [place] [address]    Culture  Setup Time   Final    GRAM POSITIVE COCCI IN CLUSTERS ANAEROBIC BOTTLE ONLY CRITICAL RESULT CALLED TO, READ BACK BY AND VERIFIED WITH: Damaris Hippo PHARMD, AT 8841 09/13/21 D. VANHOOK    Culture (A)  Final    STAPHYLOCOCCUS HOMINIS THE SIGNIFICANCE OF ISOLATING THIS ORGANISM FROM A SINGLE SET OF BLOOD CULTURES WHEN MULTIPLE SETS ARE DRAWN IS UNCERTAIN. PLEASE NOTIFY THE MICROBIOLOGY DEPARTMENT WITHIN ONE WEEK IF SPECIATION AND SENSITIVITIES ARE REQUIRED. Performed at [facility], [address] [place], [place] [address]    Report Status 09/15/2021 FINAL  Final  Culture, blood (Routine X 2) w Reflex to ID Panel     Status: None   Collection Time: 09/11/21  5:14 PM   Specimen: BLOOD  Result Value Ref Range Status   Specimen Description   Final    BLOOD BLOOD LEFT HAND Performed at [facility], [address] [place], [place] [address]    Special Requests   Final    BOTTLES DRAWN AEROBIC ONLY Blood Culture results may not be optimal due to an inadequate volume of blood received in culture bottles Performed at [facility], [address] [place], [place] [address]    Culture   Final    NO GROWTH 5 DAYS Performed at [facility], [address], [place], [place] [address]    Report Status 09/17/2021 FINAL  Final  Culture, blood (Routine X 2) w Reflex to ID Panel     Status: None   Collection Time: 09/14/21 12:40 PM   Specimen: BLOOD  Result Value Ref Range Status   Specimen Description   Final    BLOOD RIGHT ANTECUBITAL Performed at [facility], [address], [place], [place] [address]    Special Requests   Final  BOTTLES DRAWN AEROBIC ONLY Blood Culture adequate volume Performed at [facility], [address] [place], [place] [address]    Culture   Final    NO GROWTH 5 DAYS Performed at [facility], [address], [place], [place] [address]    Report Status 09/19/2021 FINAL  Final  Culture, blood (Routine X 2) w Reflex to ID Panel     Status: None   Collection Time: 09/14/21 12:40 PM   Specimen: BLOOD  Result Value Ref Range Status   Specimen Description   Final    BLOOD BLOOD LEFT HAND Performed at [facility], [address], [place], [place] [address]    Special Requests   Final    BOTTLES DRAWN AEROBIC ONLY Blood Culture adequate volume Performed at [facility], [address], [place], [place] [address]    Culture   Final    NO GROWTH 5 DAYS Performed at [facility], [address] [place], [place] [address]    Report Status 09/19/2021 FINAL  Final          Radiology Studies: No results found.      Scheduled Meds:  acetaminophen  1,000 mg Oral Q6H   alvimopan  12 mg Oral BID   cyclobenzaprine  10 mg Oral TID   enoxaparin (LOVENOX) injection  40 mg Subcutaneous Q12H   feeding supplement  237 mL Oral BID BM   gabapentin  400 mg Oral TID   levothyroxine  88 mcg Oral QAC breakfast   medroxyPROGESTERone  10 mg Oral Daily   pantoprazole  40 mg Oral Daily   Continuous Infusions:  anidulafungin 100 mg (09/20/21 2126)   piperacillin-tazobactam (ZOSYN)  IV 3.375 g (09/21/21 1038)      LOS: 10 days    Time spent: 35 minutes    Ramiro Harvest, MD [facility]   To contact the attending provider between 7A-7P or the covering provider during after hours 7P-7A, please log into the web site [email or web address] and access using universal [place] password for that web site. If you do not have the password, please call the hospital operator.  09/21/2021, 11:07 AM

## 2021-09-22 LAB — CBC WITH DIFFERENTIAL/PLATELET
Abs Immature Granulocytes: 0.34 10*3/uL — ABNORMAL HIGH (ref 0.00–0.07)
Basophils Absolute: 0.1 10*3/uL (ref 0.0–0.1)
Basophils Relative: 1 %
Eosinophils Absolute: 0.2 10*3/uL (ref 0.0–0.5)
Eosinophils Relative: 2 %
HCT: 38.8 % (ref 36.0–46.0)
Hemoglobin: 12.5 g/dL (ref 12.0–15.0)
Immature Granulocytes: 3 %
Lymphocytes Relative: 24 %
Lymphs Abs: 2.5 10*3/uL (ref 0.7–4.0)
MCH: 31.3 pg (ref 26.0–34.0)
MCHC: 32.2 g/dL (ref 30.0–36.0)
MCV: 97 fL (ref 80.0–100.0)
Monocytes Absolute: 0.8 10*3/uL (ref 0.1–1.0)
Monocytes Relative: 8 %
Neutro Abs: 6.3 10*3/uL (ref 1.7–7.7)
Neutrophils Relative %: 62 %
Platelets: 256 10*3/uL (ref 150–400)
RBC: 4 MIL/uL (ref 3.87–5.11)
RDW: 12.4 % (ref 11.5–15.5)
WBC: 10.3 10*3/uL (ref 4.0–10.5)
nRBC: 0 % (ref 0.0–0.2)

## 2021-09-22 LAB — BASIC METABOLIC PANEL
Anion gap: 9 (ref 5–15)
BUN: 6 mg/dL (ref 6–20)
CO2: 26 mmol/L (ref 22–32)
Calcium: 8.6 mg/dL — ABNORMAL LOW (ref 8.9–10.3)
Chloride: 100 mmol/L (ref 98–111)
Creatinine, Ser: 0.54 mg/dL (ref 0.44–1.00)
GFR, Estimated: >60 mL/min (ref >60)
Glucose, Bld: 82 mg/dL (ref 70–99)
Potassium: 3.4 mmol/L — ABNORMAL LOW (ref 3.5–5.1)
Sodium: 135 mmol/L (ref 135–145)

## 2021-09-22 LAB — MAGNESIUM: Magnesium: 2 mg/dL (ref 1.7–2.4)

## 2021-09-22 MED ORDER — POTASSIUM CHLORIDE CRYS ER 20 MEQ PO TBCR
40.0000 meq | EXTENDED_RELEASE_TABLET | Freq: Every day | ORAL | Status: AC
  Administered 2021-09-22 – 2021-09-24 (×3): 40 meq via ORAL
  Filled 2021-09-22 (×3): qty 2

## 2021-09-22 NOTE — Progress Notes (Signed)
5 Days Post-Op   Subjective/Chief Complaint: She continues to feel much better.  She has increased oral intake and is tolerating this well.  She has had no fevers.  Still working on ambulating   Objective: Vital signs in last 24 hours: Temp:  [97.6 F (36.4 C)-98.4 F (36.9 C)] 98 F (36.7 C) (12/11 0509) Pulse Rate:  [87-100] 90 (12/11 0509) Resp:  [16-20] 20 (12/11 0509) BP: (128-143)/(83-98) 133/83 (12/11 0509) SpO2:  [98 %-100 %] 98 % (12/11 0509) Weight:  [174.5 kg] 174.5 kg (12/11 0500) Last BM Date: 09/21/21  Intake/Output from previous day: 12/10 0701 - 12/11 0700 In: 726 [P.O.:726] Out: 1050 [Stool:1050] Intake/Output this shift: No intake/output data recorded.  Exam: She looks much more comfortable this morning and is sitting up eating breakfast Her abdomen is soft and obese.  Ostomy is working well  Lab Results:  Recent Labs    09/21/21 0606 09/22/21 0537  WBC 11.4* 10.3  HGB 13.7 12.5  HCT 42.6 38.8  PLT 240 256   BMET Recent Labs    09/21/21 0606 09/22/21 0537  NA 133* 135  K 3.1* 3.4*  CL 99 100  CO2 25 26  GLUCOSE 99 82  BUN 6 6  CREATININE 0.67 0.54  CALCIUM 8.4* 8.6*   PT/INR No results for input(s): LABPROT, INR in the last 72 hours. ABG No results for input(s): PHART, HCO3 in the last 72 hours.  Invalid input(s): PCO2, PO2  Studies/Results: No results found.  Anti-infectives: Anti-infectives (From admission, onward)    Start     Dose/Rate Route Frequency Ordered Stop   09/18/21 1400  anidulafungin (ERAXIS) 100 mg in sodium chloride 0.9 % 100 mL IVPB        100 mg 78 mL/hr over 100 Minutes Intravenous Every 24 hours 09/17/21 2117 09/21/21 1715   09/18/21 0000  piperacillin-tazobactam (ZOSYN) IVPB 3.375 g        3.375 g 12.5 mL/hr over 240 Minutes Intravenous Every 8 hours 09/17/21 2117 09/23/21 0359   09/17/21 1830  clindamycin (CLEOCIN) 900 mg, gentamicin (GARAMYCIN) 240 mg in sodium chloride 0.9 % 1,000 mL for  intraperitoneal lavage  Status:  Discontinued          As needed 09/17/21 1850 09/17/21 2117   09/17/21 1500  clindamycin (CLEOCIN) 900 mg, gentamicin (GARAMYCIN) 240 mg in sodium chloride 0.9 % 1,000 mL for intraperitoneal lavage  Status:  Discontinued       Note to Pharmacy: Have in the  OR room for final irrigation in bowel surgery case to minimize risk of abscess/infection Pharmacy may adjust dosing strength, schedule, rate of infusion, etc as needed to optimize therapy    Irrigation To Surgery 09/17/21 1448 09/17/21 2117   09/17/21 1400  anidulafungin (ERAXIS) 100 mg in sodium chloride 0.9 % 100 mL IVPB  Status:  Discontinued        100 mg 78 mL/hr over 100 Minutes Intravenous Every 24 hours 09/16/21 1352 09/17/21 2117   09/17/21 0600  cefoTEtan (CEFOTAN) 2 g in sodium chloride 0.9 % 100 mL IVPB        2 g 200 mL/hr over 30 Minutes Intravenous On call to O.R. 09/16/21 0835 09/17/21 1513   09/16/21 1500  anidulafungin (ERAXIS) 200 mg in sodium chloride 0.9 % 200 mL IVPB        200 mg 78 mL/hr over 200 Minutes Intravenous  Once 09/16/21 1400 09/16/21 2338   09/16/21 1400  neomycin (MYCIFRADIN) tablet 1,000 mg  Status:  Discontinued       See Hyperspace for full Linked Orders Report.   1,000 mg Oral 3 times per day 09/16/21 0835 09/16/21 1107   09/16/21 1400  metroNIDAZOLE (FLAGYL) tablet 1,000 mg       See Hyperspace for full Linked Orders Report.   1,000 mg Oral 3 times per day 09/16/21 0835 09/16/21 2008   09/16/21 1247  neomycin (MYCIFRADIN) tablet 500 mg  Status:  Discontinued        500 mg Oral As directed 09/16/21 1247 09/16/21 1254   09/11/21 1600  piperacillin-tazobactam (ZOSYN) IVPB 3.375 g  Status:  Discontinued        3.375 g 12.5 mL/hr over 240 Minutes Intravenous Every 8 hours 09/11/21 1538 09/17/21 2117   09/11/21 0645  piperacillin-tazobactam (ZOSYN) IVPB 3.375 g        3.375 g 100 mL/hr over 30 Minutes Intravenous  Once 09/11/21 0634 09/11/21 0729        Assessment/Plan: s/p Procedure(s): XI ROBOT ASSISTED SIGMOID COLECTOMY WITH END COLOSTOMY AND BILATERAL TAP BLOCK, LYSIS OF ADHESIONS, DRAINAGE OF ABSCESSES X3 (N/A) CYSTOSCOPY with FIREFLY INJECTION (N/A)  Postop day #5  WBC is now normal.   Continuing current care. Hopefully she may be ready for discharge in the next 24 to 48 hours   Lori Mcknight 09/22/2021

## 2021-09-22 NOTE — Progress Notes (Signed)
PROGRESS NOTE    [place] C Garron  XBJ:[id number] DOB: 02/08/1986 DOA: 09/11/2021 PCP: Claiborne Rigg, NP    Chief Complaint  Patient presents with   Abdominal Pain    Brief Narrative:  The patient is a 35 yr old woman who has had multiple recurrences of diverticulitis with abscess formation, depression anxiety, anemia, hypertension, hypothyroidism, PCOS, super morbid obesity presents this time with severe abdominal pain.  CT of the abdomen and pelvis demonstrated increase in the size of the abscess.  She was started on IV Zosyn and general surgery consulted. General surgery recommended IV antibiotics and clear sips. In view of her worsening abscesses and pneumoperitoneum, she is scheduled for robotic assisted sigmoid colectomy with cystoscopy with firefly injection today.      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 Active Problems:   Essential hypertension   Hypothyroid   Anemia   Obesity, morbid, BMI 50 or higher (HCC)   Endometriosis s/p ablation   Family history of colon cancer in father   Hypokalemia   Asthma, mild intermittent   Hypomagnesemia   Fecal peritonitis (HCC)   Intra-abdominal abscess s/p robotic drainage 09/18/2021   Colostomy in place [facility])   #1 acute diverticulitis of the large intestine with abscess formation/perforated sigmoid colon likely secondary to diverticulitis/feculent peritonitis with abscesses x3 -Patient noted to have recurrent episodes of acute diverticulitis. -Dr. Gerri Lins discussed with general surgery, Dr. Cliffton Asters, noting that abscess had increased in size however remained too small for percutaneous drainage. -Due to patient's body habitus it was felt patient was high risk for complications and having drain being able to reach the skin. -General surgery following initially recommended treatment with IV antibiotics and watchful waiting. -No abscess sitting between sigmoid  colon and bladder likelihood of fistula formation allowing for natural drainage of abscess. -Patient's condition continued to worsen with worsening pneumoperitoneum and interval development of free fluid and gas along with known segment of mid sigmoid diverticulitis. -Patient being followed by general surgery and subsequently underwent robotic assisted sigmoid colectomy with cystoscopy with firefly injection 09/17/2021. -Patient with no flatus, no bowel movements, complains of lower abdominal pain. -Per general surgery patient develops fever, worsening leukocytosis worsening abdominal pain low threshold for repeat CT abdomen and pelvis to rule out further abscess formation -Continue empiric IV antibiotics, IV fluids, pain management. -IV Eraxis added per general surgery. -Diet advanced to soft diet, patient with poor appetite which is slowly improving. -Patient ambulating in hallways. -Per general surgery.  2.  Staph epidermis bacteremia -Likely contamination. -Repeat blood cultures negative x5 days..  3.  Morbid obesity -Lifestyle changes.   -Outpatient follow-up with PCP.    4.  Hypertension -Improved.   -Elevated blood pressure likely secondary to pain.   -Follow.  5.  Hypothyroidism -TSH obtained on 09/18/2021 was 15.46. -Patient stated had run out and had been off Synthroid for approximately a month. -Continue home regimen Synthroid 88 MCG's daily with outpatient follow-up and repeat TFTs in 4 to 6 weeks.  6.  Depression/anxiety -Not on any antidepressants on med rec. -Stable. -Outpatient follow-up.  7.  Hypomagnesemia -Repleted, magnesium at 2.0.  -Follow.  8.  Leukocytosis -Patient with a worsening leukocytosis postoperatively, likely reactive in the setting of acute diverticulitis with perforation.  -Leukocytosis slowly trending back down with IV antibiotics.   -Afebrile.   -Continue IV Zosyn, IV Eraxis.   -If patient develops fever, and worsening leukocytosis by  general surgery low  threshold for repeat CT abdomen and pelvis to rule out further abscess formation. -Follow.  9.  History of PCOS -Per general surgery during surgery no significant findings concerning for PCOS. -Outpatient follow-up with PCP.  10.  Chest tightness -Resolved.  11.  Hypokalemia -Potassium at 3.4 -KDur 40 mEq daily x3 days.  -Magnesium at 2.   DVT prophylaxis: Lovenox Code Status: Full Family Communication: Updated patient.  No family at bedside. Disposition:   Status is: Inpatient  Remains inpatient appropriate because: Severity of illness.       Consultants:  WOC RN: Cammie Mcgee, RN 09/18/2021 WOCN RN Lawson Fiscal McNicol's, RN 09/16/2021 General surgery: Dr. Cliffton Asters 09/11/2021  Procedures: Cystoscopy with bilateral ureteral catheterization and firefly injection per Dr.Machen, urology 09/17/2021 Robotic assisted sigmoid colectomy with end colostomy/mobilization of splenic flexure of colon/drainage of abscesses x3/robotic lysis of adhesions/transversus abdominis plane block bilaterally per Dr. Michaell Cowing, general surgery 09/17/2021 CT abdomen and pelvis 09/11/2021, 09/14/2021 2D echo 09/14/2021   Antimicrobials:  IV Eraxis 09/17/2021>>>>> 09/22/2021 IV Zosyn 09/11/2021>>>>> 09/23/2021   Subjective: Sleeping but easily arousable.  States appetite is slowly improving and ate an omelette and some sausage however had some abdominal discomfort.  No nausea or emesis.  Ostomy bag with increasing stool output.  Abdominal pain slowly improving.  Stated ambulated in hallway this morning.  Overall slowly feeling better than she did.    Objective: Vitals:   09/21/21 1330 09/21/21 2012 09/22/21 0500 09/22/21 0509  BP: (!) 143/98 128/84  133/83  Pulse: 100 87  90  Resp: 16 20  20   Temp: 98.4 F (36.9 C) 97.6 F (36.4 C)  98 F (36.7 C)  TempSrc:  Oral  Oral  SpO2: 100% 99%  98%  Weight:   (!) 174.5 kg   Height:        Intake/Output Summary (Last 24 hours) at 09/22/2021  1016 Last data filed at 09/21/2021 2030 Gross per 24 hour  Intake 240 ml  Output 800 ml  Net -560 ml    Filed Weights   09/19/21 0500 09/20/21 0500 09/22/21 0500  Weight: (!) 174.7 kg (!) 174.8 kg (!) 174.5 kg    Examination:  General exam: NAD. Respiratory system: Lungs clear to auscultation bilaterally anterior lung fields..  No wheezes, crackles, no rhonchi.  Normal respiratory effort.  Speaking in full sentences  Cardiovascular system: RRR no murmurs rubs or gallops.  No JVD.  No lower extremity edema. Gastrointestinal system: Abdomen soft, nondistended, positive bowel sounds.  Colostomy with red stoma with liquid stool noted.  Positive bowel sounds.  Bandage over prior JP site.  Diffuse tenderness to palpation around incision sites decreasing.  Central nervous system: Alert and oriented.  Moving extremities spontaneously.  No focal neurological deficits.   Extremities: Symmetric 5 x 5 power. Skin: No rashes, lesions or ulcers Psychiatry: Judgement and insight appear normal. Mood & affect appropriate.     Data Reviewed: I have personally reviewed following labs and imaging studies  CBC: Recent Labs  Lab 09/17/21 0522 09/18/21 0539 09/19/21 0534 09/20/21 0647 09/21/21 0606 09/22/21 0537  WBC 8.6 19.0* 14.8* 13.7* 11.4* 10.3  NEUTROABS 5.1  --  11.5* 9.4* 7.7 6.3  HGB 13.6 15.4* 15.1* 13.3 13.7 12.5  HCT 41.4 46.0 45.5 41.6 42.6 38.8  MCV 95.8 94.7 96.8 97.9 97.3 97.0  PLT 193 249 251 226 240 256     Basic Metabolic Panel: Recent Labs  Lab 09/16/21 0850 09/16/21 1256 09/18/21 0539 09/19/21 0534 09/20/21 0647 09/21/21 0606 09/22/21  0537  NA  --    < > 135 132* 130* 133* 135  K  --    < > 3.8 4.0 3.7 3.1* 3.4*  CL  --    < > 103 101 99 99 100  CO2  --    < > 23 21* 22 25 26   GLUCOSE  --    < > 131* 128* 99 99 82  BUN  --    < > <5* <5* 7 6 6   CREATININE  --    < > 0.71 0.76 0.70 0.67 0.54  CALCIUM  --    < > 8.5* 8.4* 8.3* 8.4* 8.6*  MG 1.9  --  1.5*  2.0 1.9 1.9 2.0  PHOS 2.9  --   --   --   --  2.7  --    < > = values in this interval not displayed.     GFR: Estimated Creatinine Clearance: 161.8 mL/min (by C-G formula based on SCr of 0.54 mg/dL).  Liver Function Tests: Recent Labs  Lab 09/21/21 0606  ALBUMIN 3.2*     CBG: No results for input(s): GLUCAP in the last 168 hours.   Recent Results (from the past 240 hour(s))  Culture, blood (Routine X 2) w Reflex to ID Panel     Status: None   Collection Time: 09/14/21 12:40 PM   Specimen: BLOOD  Result Value Ref Range Status   Specimen Description   Final    BLOOD RIGHT ANTECUBITAL Performed at [facility], [address], [place], [place]    Special Requests   Final    BOTTLES DRAWN AEROBIC ONLY Blood Culture adequate volume Performed at [facility], [address] [place], [place]    Culture   Final    NO GROWTH 5 DAYS Performed at [facility], [address] [place], [address] [place]    Report Status 09/19/2021 FINAL  Final  Culture, blood (Routine X 2) w Reflex to ID Panel     Status: None   Collection Time: 09/14/21 12:40 PM   Specimen: BLOOD  Result Value Ref Range Status   Specimen Description   Final    BLOOD BLOOD LEFT HAND Performed at [facility], [address], [place], [place]    Special Requests   Final    BOTTLES DRAWN AEROBIC ONLY Blood Culture adequate volume Performed at [facility], [address], [place], [place]    Culture   Final    NO GROWTH 5 DAYS Performed at [facility], [address] [place], [address] [place]    Report Status 09/19/2021 FINAL  Final          Radiology Studies: No results found.      Scheduled Meds:  acetaminophen  1,000 mg Oral Q6H   alvimopan  12 mg Oral BID   cyclobenzaprine  10 mg Oral TID   enoxaparin (LOVENOX) injection  40 mg Subcutaneous Q12H   feeding  supplement  237 mL Oral BID BM   gabapentin  400 mg Oral TID   levothyroxine  88 mcg Oral QAC breakfast   medroxyPROGESTERone  10 mg Oral Daily   pantoprazole  40 mg Oral Daily   potassium chloride  40 mEq Oral Daily   Continuous Infusions:  piperacillin-tazobactam (ZOSYN)  IV 3.375 g (09/22/21 0359)     LOS: 11 days    Time spent:  35 minutes    Ramiro Harvest, MD [facility]   To contact the attending provider between 7A-7P or the covering provider during after hours 7P-7A, please log into the web site [email or web address] and access using universal [place] password for that web site. If you do not have the password, please call the hospital operator.  09/22/2021, 10:16 AM

## 2021-09-22 NOTE — Plan of Care (Signed)
  Problem: Pain Managment: Goal: General experience of comfort will improve Outcome: Progressing   Problem: Safety: Goal: Ability to remain free from injury will improve Outcome: Progressing   

## 2021-09-23 LAB — CBC
HCT: 43.6 % (ref 36.0–46.0)
Hemoglobin: 14 g/dL (ref 12.0–15.0)
MCH: 31.6 pg (ref 26.0–34.0)
MCHC: 32.1 g/dL (ref 30.0–36.0)
MCV: 98.4 fL (ref 80.0–100.0)
Platelets: 272 10*3/uL (ref 150–400)
RBC: 4.43 MIL/uL (ref 3.87–5.11)
RDW: 12.8 % (ref 11.5–15.5)
WBC: 11 10*3/uL — ABNORMAL HIGH (ref 4.0–10.5)
nRBC: 0 % (ref 0.0–0.2)

## 2021-09-23 LAB — BASIC METABOLIC PANEL
Anion gap: 10 (ref 5–15)
BUN: 5 mg/dL — ABNORMAL LOW (ref 6–20)
CO2: 24 mmol/L (ref 22–32)
Calcium: 8.8 mg/dL — ABNORMAL LOW (ref 8.9–10.3)
Chloride: 101 mmol/L (ref 98–111)
Creatinine, Ser: 0.8 mg/dL (ref 0.44–1.00)
GFR, Estimated: >60 mL/min (ref >60)
Glucose, Bld: 81 mg/dL (ref 70–99)
Potassium: 4 mmol/L (ref 3.5–5.1)
Sodium: 135 mmol/L (ref 135–145)

## 2021-09-23 MED ORDER — GABAPENTIN 300 MG PO CAPS: 300.0000 mg | ORAL_CAPSULE | Freq: Three times a day (TID) | ORAL | 1 refills | Status: DC

## 2021-09-23 MED ORDER — TRAMADOL HCL 50 MG PO TABS: 50.0000 mg | ORAL_TABLET | Freq: Four times a day (QID) | ORAL | 0 refills | Status: DC | PRN

## 2021-09-23 MED ORDER — GABAPENTIN 300 MG PO CAPS
600.0000 mg | ORAL_CAPSULE | Freq: Three times a day (TID) | ORAL | Status: DC
  Administered 2021-09-23 – 2021-09-24 (×5): 600 mg via ORAL
  Filled 2021-09-23 (×5): qty 2

## 2021-09-23 MED ORDER — MORPHINE SULFATE 15 MG PO TABS
15.0000 mg | ORAL_TABLET | ORAL | Status: DC | PRN
  Administered 2021-09-24 (×2): 15 mg via ORAL
  Filled 2021-09-23 (×2): qty 1

## 2021-09-23 NOTE — Progress Notes (Signed)
Pharmacy Brief Note - Alvimopan (Entereg)  The standing order set for alvimopan (Entereg) now includes an automatic order to discontinue the drug after the patient has had a bowel movement. The change was approved by the Pharmacy & Therapeutics Committee and the Medical Executive Committee.  This patient has had a bowel movement documented by nursing. Therefore, alvimopan has been discontinued. If there are questions, please contact the pharmacy at [phone].  Thank you  Bernadene Person, PharmD, BCPS [phone] 09/23/2021, 1:11 PM

## 2021-09-23 NOTE — Progress Notes (Signed)
PT Cancellation Note  Patient Details Name: Lori Mcknight MRN: [id number] DOB: 03-12-1986   Cancelled Treatment:    Reason Eval/Treat Not Completed: Other (comment)Patient has ambulated in hall and plans to go again.   Rada Hay 09/23/2021, 3:21 PM Blanchard Kelch PT Acute Rehabilitation Services Pager [phone] Office [phone]

## 2021-09-23 NOTE — Consult Note (Signed)
WOC Nurse ostomy follow up Patient receiving care in [facility]. Stoma type/location: LUQ colostomy Stomal assessment/size: 1 and 3/4 inches, round, sutures intact, moist, pink Peristomal assessment: intact Treatment options for stomal/peristomal skin: barrier ring and flexible convex pouch Output: soft brown Ostomy pouching: 1pc. Flexible, [place] [phone] Education provided: Patient removed existing pouch, prepared new pouch, observed cleaning the skin with water only. She stretched the barrier ring, and I guided her hands to place the pouch around the stoma. She asked appropriate questions.  I provided her with the [facility] program information and explained she has to be the one to call them and enroll. Enrolled patient in [place] Secure Start Discharge program: Yes and she tells me the box of supplies have arrived.  Patient was instructed to take home all of the ostomy supplies in the room and the education folder. She responded that she would do so.  Helmut Muster, RN, MSN, CWOCN, CNS-BC, pager [phone]

## 2021-09-23 NOTE — Progress Notes (Signed)
Occupational Therapy Treatment Patient Details Name: Lori Mcknight MRN: [id number] DOB: April 22, 1986 Today's Date: 09/23/2021   History of present illness The patient is a 35 yr old woman who has had multiple recurrences of diverticulitis with abscess formation, depression anxiety, anemia, hypertension, hypothyroidism, PCOS, super morbid obesity presents this time with severe abdominal pain. Pt s/p robotic colectomy/colostomy   OT comments  Patient was educated on how to use total hip kit for LB dressing. Patient was able to demonstrate understanding. Patient completed mobility in room with SUP with patient standing in bathroom to manage ostomy care with NT. Patient's discharge plan remains appropriate at this time. OT will continue to follow acutely.     Recommendations for follow up therapy are one component of a multi-disciplinary discharge planning process, led by the attending physician.  Recommendations may be updated based on patient status, additional functional criteria and insurance authorization.    Follow Up Recommendations  No OT follow up    Assistance Recommended at Discharge Intermittent Supervision/Assistance  Equipment Recommendations  BSC/3in1;Other (comment) (total hip kit)    Recommendations for Other Services      Precautions / Restrictions Precautions Precautions: Other (comment);Fall Precaution Comments: colostomy, drain Restrictions Weight Bearing Restrictions: No       Mobility Bed Mobility Overal bed mobility: Needs Assistance Bed Mobility: Supine to Sit;Sit to Supine     Supine to sit: Supervision;HOB elevated Sit to supine: Supervision;HOB elevated        Transfers Overall transfer level: Needs assistance Equipment used: None Transfers: Sit to/from Stand Sit to Stand: Supervision                 Balance Overall balance assessment: Mild deficits observed, not formally tested                                          ADL either performed or assessed with clinical judgement   ADL Overall ADL's : Needs assistance/impaired                     Lower Body Dressing: Set up Lower Body Dressing Details (indicate cue type and reason): patient was educated on using AE for LB dressing tasks. patient was able to demonstrate understanding. patient reported she was going to order total hip kit. patient was educated on donning/doffing pants, socks and shoes. patient verbalized and demonstrated understanding. Toilet Transfer: Set up;Ambulation;Regular Toilet     Toileting - Clothing Manipulation Details (indicate cue type and reason): patient was educated on toileting tongs and toileting wands patient verbalized understanding.     Functional mobility during ADLs: Supervision/safety      Extremity/Trunk Assessment              Vision       Perception     Praxis      Cognition Arousal/Alertness: Awake/alert Behavior During Therapy: WFL for tasks assessed/performed Overall Cognitive Status: Within Functional Limits for tasks assessed                                            Exercises     Shoulder Instructions       General Comments      Pertinent Vitals/ Pain       Pain Assessment: Faces Faces  Pain Scale: Hurts even more Pain Location: abdomen Pain Descriptors / Indicators: Aching;Grimacing;Sore;Guarding Pain Intervention(s): Monitored during session;Premedicated before session  Home Living                                          Prior Functioning/Environment              Frequency  Min 2X/week        Progress Toward Goals  OT Goals(current goals can now be found in the care plan section)  Progress towards OT goals: Progressing toward goals     Plan Discharge plan remains appropriate    Co-evaluation                 AM-PAC OT "6 Clicks" Daily Activity     Outcome Measure   Help from another person eating  meals?: None Help from another person taking care of personal grooming?: A Little Help from another person toileting, which includes using toliet, bedpan, or urinal?: A Little Help from another person bathing (including washing, rinsing, drying)?: A Little Help from another person to put on and taking off regular upper body clothing?: A Little Help from another person to put on and taking off regular lower body clothing?: A Little 6 Click Score: 19    End of Session    OT Visit Diagnosis: Pain   Activity Tolerance Patient tolerated treatment well   Patient Left with call bell/phone within reach;in bed   Nurse Communication Mobility status        Time: 1046-1110 OT Time Calculation (min): 24 min  Charges: OT General Charges $OT Visit: 1 Visit OT Treatments $Self Care/Home Management : 23-37 mins  Jackelyn Poling OTR/L, MS Acute Rehabilitation Department Office# [phone] Pager# [phone]   Marcellina Millin 09/23/2021, 12:07 PM

## 2021-09-23 NOTE — TOC Progression Note (Signed)
Transition of Care [facility]) - Progression Note    Patient Details  Name: Lori Mcknight MRN: [id number] Date of Birth: 28-Jul-1986  Transition of [facility]) CM/SW Contact  Darleene Cleaver, [place] Phone Number: 09/23/2021, 1:14 PM  Clinical Narrative:     CSW spoke to [facility] who is charity home health this week.  They are able to provide charity [facility] RN for patient.  CSW also spoke to [place] from [facility], and patient will be receiving a bariatric walker and a 3 in 1 under charity.  CSW updated patient who was made aware of charity services.  Patient will be going to her sister's house once she is ready for discharge.  Her sister lives at [address], [place], [place], [address].  Patient still on IV antibiotics and IV pain meds.   Expected Discharge Plan: Home w Home Health Services Barriers to Discharge: No Barriers Identified  Expected Discharge Plan and Services Expected Discharge Plan: Home w Home Health Services   Discharge Planning Services: CM Consult Post Acute Care Choice: Home Health Living arrangements for the past 2 months: Apartment                                       Social Determinants of Health (SDOH) Interventions    Readmission Risk Interventions No flowsheet data found.

## 2021-09-23 NOTE — Progress Notes (Signed)
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  CT of the abdomen and pelvis demonstrated increase in the size of the abscess.  She was started on IV Zosyn and general surgery consulted. General surgery recommended IV antibiotics and clear sips. In view of her worsening abscesses and pneumoperitoneum, she is scheduled for robotic assisted sigmoid colectomy with cystoscopy with firefly injection today.      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 Active Problems:   Essential hypertension   Hypothyroid   Anemia   Obesity, morbid, BMI 50 or higher (HCC)   Endometriosis s/p ablation   Family history of colon cancer in father   Hypokalemia   Asthma, mild intermittent   Hypomagnesemia   Fecal peritonitis (HCC)   Intra-abdominal abscess s/p robotic drainage 09/18/2021   Colostomy in place [facility])   #1 acute diverticulitis of the large intestine with abscess formation/perforated sigmoid colon likely secondary to diverticulitis/feculent peritonitis with abscesses x3 -Patient noted to have recurrent episodes of acute diverticulitis. -Dr. Gerri Lins discussed with general surgery, Dr. Cliffton Asters, noting that abscess had increased in size however remained too small for percutaneous drainage. -Due to patient's body habitus it was felt patient was high risk for complications and having drain being able to reach the skin. -General surgery following initially recommended treatment with IV antibiotics and watchful waiting. -No abscess sitting between sigmoid  colon and bladder likelihood of fistula formation allowing for natural drainage of abscess. -Patient's condition continued to worsen with worsening pneumoperitoneum and interval development of free fluid and gas along with known segment of mid sigmoid diverticulitis. -Patient followed by general surgery and subsequently underwent robotic assisted sigmoid colectomy with cystoscopy with firefly injection 09/17/2021. -Patient with no flatus, no bowel movements, complains of lower abdominal pain. -Per general surgery patient develops fever, worsening leukocytosis worsening abdominal pain low threshold for repeat CT abdomen and pelvis to rule out further abscess formation. -Status post 5 days IV Zosyn and IV Eraxis. -Patient still with significant pain requiring IV pain medication. -Tolerating current diet/soft diet however poor appetite. -Placed on MS IR 15 mg p.o. every 4 hours as needed and try to transition of IV pain medications. -Continue scheduled Tylenol. -Continue ambulation/mobility. -Per general surgery.  2.  Staph epidermis bacteremia -Likely secondary to contamination.   -Repeat blood cultures negative.   -No further work-up needed.    3.  Morbid obesity -Lifestyle changes.   -Outpatient follow-up with PCP.    4.  Hypertension -Improved.   -Elevated blood pressure likely secondary to pain.   -Follow.  5.  Hypothyroidism -TSH obtained on 09/18/2021 was 15.46. -Patient stated had run out and had been off Synthroid for approximately a month. -Continue home regimen Synthroid 88 MCG's daily with outpatient follow-up and repeat TFTs in 4 to 6 weeks.  6.  Depression/anxiety -Stable.   -Not on antidepressants.   -Outpatient follow-up.    7.  Hypomagnesemia -Repleted, magnesium at 2.0.  -Follow.  8.  Leukocytosis -Patient with a worsening leukocytosis postoperatively, likely reactive in the setting of acute diverticulitis with perforation.  -Leukocytosis slowly  trending back  down with IV antibiotics.   -Afebrile.   -Status post IV Zosyn, IV Eraxis.   -If patient develops fever, and worsening leukocytosis by general surgery low threshold for repeat CT abdomen and pelvis to rule out further abscess formation. -Follow.  9.  History of PCOS -Per general surgery during surgery no significant findings concerning for PCOS. -Outpatient follow-up with PCP.  10.  Chest tightness -Resolved.  11.  Hypokalemia -Repleted.  Potassium of 4.0. -Magnesium at 2.   DVT prophylaxis: Lovenox Code Status: Full Family Communication: Updated patient.  No family at bedside. Disposition:   Status is: Inpatient  Remains inpatient appropriate because: Severity of illness.       Consultants:  WOC RN: Cammie Mcgee, RN 09/18/2021 WOCN RN Lawson Fiscal McNicol's, RN 09/16/2021 General surgery: Dr. Cliffton Asters 09/11/2021  Procedures: Cystoscopy with bilateral ureteral catheterization and firefly injection per Dr.Machen, urology 09/17/2021 Robotic assisted sigmoid colectomy with end colostomy/mobilization of splenic flexure of colon/drainage of abscesses x3/robotic lysis of adhesions/transversus abdominis plane block bilaterally per Dr. Michaell Cowing, general surgery 09/17/2021 CT abdomen and pelvis 09/11/2021, 09/14/2021 2D echo 09/14/2021   Antimicrobials:  IV Eraxis 09/17/2021>>>>> 09/22/2021 IV Zosyn 09/11/2021>>>>> 09/23/2021   Subjective: Patient complaining of abdominal pain still requiring IV pain medication.  Still with low appetite.  No chest pain.  No shortness of breath.   Objective: Vitals:   09/22/21 1421 09/22/21 2009 09/23/21 0500 09/23/21 0525  BP: (!) 157/94 130/79  (!) 148/94  Pulse: 99 81  84  Resp: 17 20  20   Temp: 98.1 F (36.7 C) (!) 97.5 F (36.4 C)  97.8 F (36.6 C)  TempSrc: Oral Oral  Oral  SpO2: 98% 99%  98%  Weight:   (!) 176.9 kg   Height:        Intake/Output Summary (Last 24 hours) at 09/23/2021 0954 Last data filed at 09/22/2021 1700 Gross per 24 hour   Intake 960 ml  Output --  Net 960 ml    Filed Weights   09/20/21 0500 09/22/21 0500 09/23/21 0500  Weight: (!) 174.8 kg (!) 174.5 kg (!) 176.9 kg    Examination:  General exam: NAD. Respiratory system: CTA B.  No wheezes, no crackles, no rhonchi.  Normal respiratory effort.  Speaking in full sentences.   Cardiovascular system: Regular rate rhythm no murmurs rubs or gallops.  No JVD.  No lower extremity edema. Gastrointestinal system: Abdomen is soft, nondistended, positive bowel sounds.  Some diffuse tenderness to palpation.  Colostomy with brown stool.  Bandage over prior JP site.  Central nervous system: Alert and oriented.  No focal neurological deficits.  Moving extremities spontaneously.  Extremities: Symmetric 5 x 5 power. Skin: No rashes, lesions or ulcers Psychiatry: Judgement and insight appear normal. Mood & affect appropriate.     Data Reviewed: I have personally reviewed following labs and imaging studies  CBC: Recent Labs  Lab 09/17/21 0522 09/18/21 0539 09/19/21 0534 09/20/21 0647 09/21/21 0606 09/22/21 0537 09/23/21 0511  WBC 8.6   < > 14.8* 13.7* 11.4* 10.3 11.0*  NEUTROABS 5.1  --  11.5* 9.4* 7.7 6.3  --   HGB 13.6   < > 15.1* 13.3 13.7 12.5 14.0  HCT 41.4   < > 45.5 41.6 42.6 38.8 43.6  MCV 95.8   < > 96.8 97.9 97.3 97.0 98.4  PLT 193   < > 251 226 240 256 272   < > = values in this interval not displayed.     Basic  Metabolic Panel: Recent Labs  Lab 09/18/21 0539 09/19/21 0534 09/20/21 0647 09/21/21 0606 09/22/21 0537 09/23/21 0511  NA 135 132* 130* 133* 135 135  K 3.8 4.0 3.7 3.1* 3.4* 4.0  CL 103 101 99 99 100 101  CO2 23 21* 22 25 26 24   GLUCOSE 131* 128* 99 99 82 81  BUN <5* <5* 7 6 6  5*  CREATININE 0.71 0.76 0.70 0.67 0.54 0.80  CALCIUM 8.5* 8.4* 8.3* 8.4* 8.6* 8.8*  MG 1.5* 2.0 1.9 1.9 2.0  --   PHOS  --   --   --  2.7  --   --      GFR: Estimated Creatinine Clearance: 163.1 mL/min (by C-G formula based on SCr of 0.8  mg/dL).  Liver Function Tests: Recent Labs  Lab 09/21/21 0606  ALBUMIN 3.2*     CBG: No results for input(s): GLUCAP in the last 168 hours.   Recent Results (from the past 240 hour(s))  Culture, blood (Routine X 2) w Reflex to ID Panel     Status: None   Collection Time: 09/14/21 12:40 PM   Specimen: BLOOD  Result Value Ref Range Status   Specimen Description   Final    BLOOD RIGHT ANTECUBITAL Performed at [facility], [address] [place], [place]    Special Requests   Final    BOTTLES DRAWN AEROBIC ONLY Blood Culture adequate volume Performed at [facility], [address], [place], [place]    Culture   Final    NO GROWTH 5 DAYS Performed at [facility], [address], [place], [address] [place]    Report Status 09/19/2021 FINAL  Final  Culture, blood (Routine X 2) w Reflex to ID Panel     Status: None   Collection Time: 09/14/21 12:40 PM   Specimen: BLOOD  Result Value Ref Range Status   Specimen Description   Final    BLOOD BLOOD LEFT HAND Performed at [facility], [address] [place], [place]    Special Requests   Final    BOTTLES DRAWN AEROBIC ONLY Blood Culture adequate volume Performed at [facility], [address] [place], [place]    Culture   Final    NO GROWTH 5 DAYS Performed at [facility], [address], [place], [address] [place]    Report Status 09/19/2021 FINAL  Final          Radiology Studies: No results found.      Scheduled Meds:  acetaminophen  1,000 mg Oral Q6H   alvimopan  12 mg Oral BID   cyclobenzaprine  10 mg Oral TID   enoxaparin (LOVENOX) injection  40 mg Subcutaneous Q12H   feeding supplement  237 mL Oral BID BM   gabapentin  600 mg Oral TID   levothyroxine  88 mcg Oral QAC breakfast   medroxyPROGESTERone  10 mg Oral Daily   pantoprazole  40 mg Oral Daily   potassium  chloride  40 mEq Oral Daily   Continuous Infusions:     LOS: 12 days    Time spent: 35 minutes    [id number], MD [facility]   To contact the attending provider between 7A-7P or the covering provider during after hours 7P-7A, please log into the web site [email or web address] and access using universal [place] password for that web site. If you do not have the password, please  call the hospital operator.  09/23/2021, 9:54 AM

## 2021-09-23 NOTE — Progress Notes (Addendum)
[place] C Pounds [id number] 11-May-1986  CARE TEAM:  PCP: Claiborne Rigg, NP  Outpatient Care Team: Patient Care Team: Claiborne Rigg, NP as PCP - General (Nurse Practitioner) Allie Bossier, MD (Inactive) as Consulting Physician (Obstetrics and Gynecology) Karie Soda, MD as Consulting Physician (Colon and Rectal Surgery) Ccs, Md, MD as Consulting Physician (General Surgery) Despina Arias, MD as Consulting Physician (Urology)  Inpatient Treatment Team: Treatment Team: Attending Provider: Rodolph Bong, MD; Consulting Physician: Bishop Limbo, MD; Consulting Physician: Karie Soda, MD; WOC Nurse: Teressa Lower [employer], RN; Rounding Team: Lilyan Gilford, MD; Registered Nurse: Kizzie Bane, RN; Occupational Therapist: Ardyth Harps, OT; Physical Therapist: Awilda Bill, PT; Licensed Practical Nurse: Annett Gula, LPN   Problem List:   Principal Problem:   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 Active Problems:   Obesity, morbid, BMI 50 or higher (HCC)   Family history of colon cancer in father   Essential hypertension   Hypothyroid   Anemia   Endometriosis s/p ablation   Hypokalemia   Asthma, mild intermittent   Hypomagnesemia   Fecal peritonitis (HCC)   Intra-abdominal abscess s/p robotic drainage 09/18/2021   Colostomy in place [facility])   6 Days Post-Op  09/17/2021  POST-OPERATIVE DIAGNOSIS:   Perforated sigmoid colon most likely due to diverticulitis  Feculent peritonitis Abscesses x 3   PROCEDURE:   XI ROBOT ASSISTED SIGMOID COLECTOMY WITH END COLOSTOMY (HARTMANN) MOBILIZATION OF SPLENIC FLEXURE OF COLON DRAINAGE OF ABSCESSES X 3 ROBOTIC LYSIS OF ADHESIONS TRANSVERSUS ABDOMINIS PLANE (TAP) BLOCK - BILATERAL   SURGEON:  Ardeth Sportsman, MD   OR FINDINGS:    Patient had large phlegmon of old mentum and mid ileum adherent to a perforated sigmoid colon.  Interloop abscess with feculent peritonitis.  Left adnexa  Fallopian tube very dilated with purulence consistent with secondary tubo-ovarian abscess.  Very dense adhesions to the left posterolateral bladder dome with moderate abscess and purulence there as well.  All these 3 abscesses were drained.  Bladder distended and no evidence of any active colovesical fistula.  No evidence of any appendicitis.  Some dilatation consistent with probable partial small bowel obstruction at the site of the small bowel trying to contain the perforated colon with feculent peritonitis.  Therefore no anastomosis done.  End Hartmann resection.  No obvious metastatic disease on visceral parietal peritoneum or liver.   CASE DATA:   Type of patient?: LDOW CASE (Surgical Hospitalist [facility] Inpatient)   Status of Case? URGENT Add On   Infection Present At Time Of Surgery (PATOS)?   Feculent peritonitis, phlegmon, abscesses x3.      Assessment  Stable  [facility] Stay = 12 days)  Plan:  -IV ABx x 5d postop (Zosyn/Eraxis) completed  -ERAS protocol.  -Solid diet   -pathology c/w diverticulitis  -colostomy care and teaching  -Replace electrolytes.  Scheduled potassium given borderline low.  Hypomagnesemia rated..  -inc PO pain control.  -VTE prophylaxis- SCDs.  Enoxaparin OK - adjust dose per pharmacy given BMI 58  -I saw no strong evidence of polycystic ovarian syndrome interoperatively nor prior ultrasounds but will defer to medicine and gynecology if that truly needs to be treated  -mobilize as tolerated to help recovery.  She still seems to just want to be in the room.  Need to get her up moving around more to help minimize ileus and pulmonary issues.  Consulting therapies for evaluation to see if benefit of home health or equipment issues  -  Moderate malnutrition given her prolonged diverticulitis and chronic abscess.  Supplemental shakes as tolerated.  Hopefully should resolve.  -Hopefully can do second surgery in 3-6 months once this episode has resolved  finally.  We will have to see how she does in the first 6 weeks with the surgery  -No evidence of colovesical fistula despite abscess on bladder.  Can remove per protocol POD#1.  I&O cath as needed  Disposition: Per primary service.  OK TO DISCHARGE FROM SURGERY STANDPOINT with home health for colostomy.  Her financial and other issues make her recovery challenging.       40 minutes spent in review, evaluation, examination, counseling, and coordination of care.   I have reviewed this patient's available data, including medical history, events of note, physical examination and test results as part of my evaluation.  A significant portion of that time was spent in counseling.  Care during the described time interval was provided by me.  09/23/2021    Subjective: (Chief complaint)  Patient with some soreness but feeling better.  Tolerating solid diet.  Drain removed  Objective:  Vital signs:  Vitals:   09/22/21 1421 09/22/21 2009 09/23/21 0500 09/23/21 0525  BP: (!) 157/94 130/79  (!) 148/94  Pulse: 99 81  84  Resp: 17 20  20   Temp: 98.1 F (36.7 C) (!) 97.5 F (36.4 C)  97.8 F (36.6 C)  TempSrc: Oral Oral  Oral  SpO2: 98% 99%  98%  Weight:   (!) 176.9 kg   Height:        Last BM Date: 09/22/21  Intake/Output   Yesterday:  12/11 0701 - 12/12 0700 In: 1200 [P.O.:1200] Out: -  This shift:  No intake/output data recorded.  Bowel function:  Flatus: YES  BM:  YES  Drain: (No drain)   Physical Exam:  General: Pt awake/alert in no acute distress Eyes: PERRL, normal EOM.  Sclera clear.  No icterus Neuro: CN II-XII intact w/o focal sensory/motor deficits. Lymph: No head/neck/groin lymphadenopathy Psych:  No delerium/psychosis/paranoia.  Oriented x 4 HENT: Normocephalic, Mucus membranes moist.  No thrush Neck: Supple, No tracheal deviation.  No obvious thyromegaly Chest: No pain to chest wall compression.  Good respiratory excursion.  No audible  wheezing CV:  Pulses intact.  Regular rhythm.  No major extremity edema MS: Normal AROM mjr joints.  No obvious deformity  Abdomen: Morbidly obese Soft.  Nondistended.  Mildly tender at incisions only.  No evidence of peritonitis.  No incarcerated hernias. Left-sided colostomy pink.  + gas & stool  Ext:   No deformity.  No mjr edema.  No cyanosis Skin: No petechiae / purpurea.  No major sores.  Warm and dry    Results:   Cultures: Recent Results (from the past 720 hour(s))  Resp Panel by RT-PCR (Flu A&B, Covid) Nasopharyngeal Swab     Status: None   Collection Time: 08/28/21  1:54 PM   Specimen: Nasopharyngeal Swab; Nasopharyngeal(NP) swabs in vial transport medium  Result Value Ref Range Status   SARS Coronavirus 2 by RT PCR NEGATIVE NEGATIVE Final    Comment: (NOTE) SARS-CoV-2 target nucleic acids are NOT DETECTED.  The SARS-CoV-2 RNA is generally detectable in upper respiratory specimens during the acute phase of infection. The lowest concentration of SARS-CoV-2 viral copies this assay can detect is 138 copies/mL. A negative result does not preclude SARS-Cov-2 infection and should not be used as the sole basis for treatment or other patient management decisions. A negative result may  occur with  improper specimen collection/handling, submission of specimen other than nasopharyngeal swab, presence of viral mutation(s) within the areas targeted by this assay, and inadequate number of viral copies(<138 copies/mL). A negative result must be combined with clinical observations, patient history, and epidemiological information. The expected result is Negative.  Fact Sheet for Patients:  EntrepreneurPulse.com.au  Fact Sheet for Healthcare Providers:  IncredibleEmployment.be  This test is no t yet approved or cleared by the [place] FDA and  has been authorized for detection and/or diagnosis of SARS-CoV-2 by FDA under an Emergency Use  Authorization (EUA). This EUA will remain  in effect (meaning this test can be used) for the duration of the COVID-19 declaration under Section 564(b)(1) of the Act, 21 U.S.C.section 360bbb-3(b)(1), unless the authorization is terminated  or revoked sooner.       Influenza A by PCR NEGATIVE NEGATIVE Final   Influenza B by PCR NEGATIVE NEGATIVE Final    Comment: (NOTE) The Xpert Xpress SARS-CoV-2/FLU/RSV plus assay is intended as an aid in the diagnosis of influenza from Nasopharyngeal swab specimens and should not be used as a sole basis for treatment. Nasal washings and aspirates are unacceptable for Xpert Xpress SARS-CoV-2/FLU/RSV testing.  Fact Sheet for Patients: EntrepreneurPulse.com.au  Fact Sheet for Healthcare Providers: IncredibleEmployment.be  This test is not yet approved or cleared by the [place] FDA and has been authorized for detection and/or diagnosis of SARS-CoV-2 by FDA under an Emergency Use Authorization (EUA). This EUA will remain in effect (meaning this test can be used) for the duration of the COVID-19 declaration under Section 564(b)(1) of the Act, 21 U.S.C. section 360bbb-3(b)(1), unless the authorization is terminated or revoked.  Performed at [facility], [address] [place], [place] [address]   Blood culture (routine x 2)     Status: Abnormal   Collection Time: 09/11/21  6:42 AM   Specimen: BLOOD  Result Value Ref Range Status   Specimen Description   Final    BLOOD BLOOD LEFT FOREARM Performed at [facility], [address] [place], [place] [address]    Special Requests   Final    Blood Culture adequate volume BOTTLES DRAWN AEROBIC AND ANAEROBIC Performed at [facility], [place], [place] [address]    Culture  Setup Time   Final    GRAM POSITIVE COCCI IN BOTH AEROBIC AND ANAEROBIC BOTTLES CRITICAL RESULT CALLED TO,  READ BACK BY AND VERIFIED WITH: PHARMD K.SHADE AT 1213 ON 09/12/2021 BY T.SAAD.    Culture (A)  Final    STAPHYLOCOCCUS EPIDERMIDIS THE SIGNIFICANCE OF ISOLATING THIS ORGANISM FROM A SINGLE VENIPUNCTURE CANNOT BE PREDICTED WITHOUT FURTHER CLINICAL AND CULTURE CORRELATION. SUSCEPTIBILITIES AVAILABLE ONLY ON REQUEST. Performed at [facility], [place] [address] [place], [place] [address]    Report Status 09/14/2021 FINAL  Final  Blood Culture ID Panel (Reflexed)     Status: Abnormal   Collection Time: 09/11/21  6:42 AM  Result Value Ref Range Status   Enterococcus faecalis NOT DETECTED NOT DETECTED Final   Enterococcus Faecium NOT DETECTED NOT DETECTED Final   Listeria monocytogenes NOT DETECTED NOT DETECTED Final   Staphylococcus species DETECTED (A) NOT DETECTED Final    Comment: CRITICAL RESULT CALLED TO, READ BACK BY AND VERIFIED WITH: PHARMD K.SHADE AT 1213 ON 09/12/2021 BY T.SAAD.    Staphylococcus aureus (BCID) NOT DETECTED NOT DETECTED Final   Staphylococcus epidermidis DETECTED (A) NOT DETECTED Final    Comment: CRITICAL RESULT CALLED  TO, READ BACK BY AND VERIFIED WITH: PHARMD K.SHADE AT 1213 ON 09/12/2021 BY T.SAAD.    Staphylococcus lugdunensis NOT DETECTED NOT DETECTED Final   Streptococcus species NOT DETECTED NOT DETECTED Final   Streptococcus agalactiae NOT DETECTED NOT DETECTED Final   Streptococcus pneumoniae NOT DETECTED NOT DETECTED Final   Streptococcus pyogenes NOT DETECTED NOT DETECTED Final   A.calcoaceticus-baumannii NOT DETECTED NOT DETECTED Final   Bacteroides fragilis NOT DETECTED NOT DETECTED Final   Enterobacterales NOT DETECTED NOT DETECTED Final   Enterobacter cloacae complex NOT DETECTED NOT DETECTED Final   Escherichia coli NOT DETECTED NOT DETECTED Final   Klebsiella aerogenes NOT DETECTED NOT DETECTED Final   Klebsiella oxytoca NOT DETECTED NOT DETECTED Final   Klebsiella pneumoniae NOT DETECTED NOT DETECTED Final   Proteus species NOT DETECTED  NOT DETECTED Final   Salmonella species NOT DETECTED NOT DETECTED Final   Serratia marcescens NOT DETECTED NOT DETECTED Final   Haemophilus influenzae NOT DETECTED NOT DETECTED Final   Neisseria meningitidis NOT DETECTED NOT DETECTED Final   Pseudomonas aeruginosa NOT DETECTED NOT DETECTED Final   Stenotrophomonas maltophilia NOT DETECTED NOT DETECTED Final   Candida albicans NOT DETECTED NOT DETECTED Final   Candida auris NOT DETECTED NOT DETECTED Final   Candida glabrata NOT DETECTED NOT DETECTED Final   Candida krusei NOT DETECTED NOT DETECTED Final   Candida parapsilosis NOT DETECTED NOT DETECTED Final   Candida tropicalis NOT DETECTED NOT DETECTED Final   Cryptococcus neoformans/gattii NOT DETECTED NOT DETECTED Final   Methicillin resistance mecA/C NOT DETECTED NOT DETECTED Final    Comment: Performed at [facility], [address] [place], [place] [address]  Resp Panel by RT-PCR (Flu A&B, Covid) Nasopharyngeal Swab     Status: None   Collection Time: 09/11/21 10:45 AM   Specimen: Nasopharyngeal Swab; Nasopharyngeal(NP) swabs in vial transport medium  Result Value Ref Range Status   SARS Coronavirus 2 by RT PCR NEGATIVE NEGATIVE Final    Comment: (NOTE) SARS-CoV-2 target nucleic acids are NOT DETECTED.  The SARS-CoV-2 RNA is generally detectable in upper respiratory specimens during the acute phase of infection. The lowest concentration of SARS-CoV-2 viral copies this assay can detect is 138 copies/mL. A negative result does not preclude SARS-Cov-2 infection and should not be used as the sole basis for treatment or other patient management decisions. A negative result may occur with  improper specimen collection/handling, submission of specimen other than nasopharyngeal swab, presence of viral mutation(s) within the areas targeted by this assay, and inadequate number of viral copies(<138 copies/mL). A negative result must be combined with clinical observations, patient  history, and epidemiological information. The expected result is Negative.  Fact Sheet for Patients:  EntrepreneurPulse.com.au  Fact Sheet for Healthcare Providers:  IncredibleEmployment.be  This test is no t yet approved or cleared by the [place] FDA and  has been authorized for detection and/or diagnosis of SARS-CoV-2 by FDA under an Emergency Use Authorization (EUA). This EUA will remain  in effect (meaning this test can be used) for the duration of the COVID-19 declaration under Section 564(b)(1) of the Act, 21 U.S.C.section 360bbb-3(b)(1), unless the authorization is terminated  or revoked sooner.       Influenza A by PCR NEGATIVE NEGATIVE Final   Influenza B by PCR NEGATIVE NEGATIVE Final    Comment: (NOTE) The Xpert Xpress SARS-CoV-2/FLU/RSV plus assay is intended as an aid in the diagnosis of influenza from Nasopharyngeal swab specimens and should not be used as a  sole basis for treatment. Nasal washings and aspirates are unacceptable for Xpert Xpress SARS-CoV-2/FLU/RSV testing.  Fact Sheet for Patients: EntrepreneurPulse.com.au  Fact Sheet for Healthcare Providers: IncredibleEmployment.be  This test is not yet approved or cleared by the [place] FDA and has been authorized for detection and/or diagnosis of SARS-CoV-2 by FDA under an Emergency Use Authorization (EUA). This EUA will remain in effect (meaning this test can be used) for the duration of the COVID-19 declaration under Section 564(b)(1) of the Act, 21 U.S.C. section 360bbb-3(b)(1), unless the authorization is terminated or revoked.  Performed at [facility], [address] [place], [place] [address]   Blood culture (routine x 2)     Status: Abnormal   Collection Time: 09/11/21  3:52 PM   Specimen: BLOOD RIGHT HAND  Result Value Ref Range Status   Specimen Description   Final    BLOOD RIGHT  HAND Performed at [facility] [address], [place], [place] [address]    Special Requests   Final    BOTTLES DRAWN AEROBIC AND ANAEROBIC Blood Culture adequate volume Performed at [facility] [address], [place], [place] [address]    Culture  Setup Time   Final    GRAM POSITIVE COCCI IN CLUSTERS ANAEROBIC BOTTLE ONLY CRITICAL RESULT CALLED TO, READ BACK BY AND VERIFIED WITH: [place], AT [id number] 09/13/21 D. VANHOOK    Culture (A)  Final    STAPHYLOCOCCUS HOMINIS THE SIGNIFICANCE OF ISOLATING THIS ORGANISM FROM A SINGLE SET OF BLOOD CULTURES WHEN MULTIPLE SETS ARE DRAWN IS UNCERTAIN. PLEASE NOTIFY THE MICROBIOLOGY DEPARTMENT WITHIN ONE WEEK IF SPECIATION AND SENSITIVITIES ARE REQUIRED. Performed at [facility], [place] [address], [place], [place] [address]    Report Status 09/15/2021 FINAL  Final  Culture, blood (Routine X 2) w Reflex to ID Panel     Status: None   Collection Time: 09/11/21  5:14 PM   Specimen: BLOOD  Result Value Ref Range Status   Specimen Description   Final    BLOOD BLOOD LEFT HAND Performed at [facility] [address] [place], [place] [address]    Special Requests   Final    BOTTLES DRAWN AEROBIC ONLY Blood Culture results may not be optimal due to an inadequate volume of blood received in culture bottles Performed at [facility] [address] [place], [place] [address]    Culture   Final    NO GROWTH 5 DAYS Performed at [facility], [place] [address], [place], [place] [address]    Report Status 09/17/2021 FINAL  Final  Culture, blood (Routine X 2) w Reflex to ID Panel     Status: None   Collection Time: 09/14/21 12:40 PM   Specimen: BLOOD  Result Value Ref Range Status   Specimen Description   Final    BLOOD RIGHT ANTECUBITAL Performed at [facility] [address], [place], [place] [address]    Special Requests   Final     BOTTLES DRAWN AEROBIC ONLY Blood Culture adequate volume Performed at [facility] [address], [place], [place] [address]    Culture   Final    NO GROWTH 5 DAYS Performed at [facility], [place] [address], [place], [place] [address]    Report Status 09/19/2021 FINAL  Final  Culture, blood (Routine X 2) w Reflex to ID Panel     Status: None  Collection Time: 09/14/21 12:40 PM   Specimen: BLOOD  Result Value Ref Range Status   Specimen Description   Final    BLOOD BLOOD LEFT HAND Performed at [facility] [address] [place], [place] [address]    Special Requests   Final    BOTTLES DRAWN AEROBIC ONLY Blood Culture adequate volume Performed at [facility] [address], [place], [place] [address]    Culture   Final    NO GROWTH 5 DAYS Performed at [facility], [place] [address], [place], [place] [address]    Report Status 09/19/2021 FINAL  Final    Labs: Results for orders placed or performed during the hospital encounter of 09/11/21 (from the past 48 hour(s))  CBC with Differential/Platelet     Status: Abnormal   Collection Time: 09/22/21  5:37 AM  Result Value Ref Range   WBC 10.3 4.0 - 10.5 K/uL   RBC 4.00 3.87 - 5.11 MIL/uL   Hemoglobin 12.5 12.0 - 15.0 g/dL   HCT 38.8 36.0 - 46.0 %   MCV 97.0 80.0 - 100.0 fL   MCH 31.3 26.0 - 34.0 pg   MCHC 32.2 30.0 - 36.0 g/dL   RDW 12.4 11.5 - 15.5 %   Platelets 256 150 - 400 K/uL   nRBC 0.0 0.0 - 0.2 %   Neutrophils Relative % 62 %   Neutro Abs 6.3 1.7 - 7.7 K/uL   Lymphocytes Relative 24 %   Lymphs Abs 2.5 0.7 - 4.0 K/uL   Monocytes Relative 8 %   Monocytes Absolute 0.8 0.1 - 1.0 K/uL   Eosinophils Relative 2 %   Eosinophils Absolute 0.2 0.0 - 0.5 K/uL   Basophils Relative 1 %   Basophils Absolute 0.1 0.0 - 0.1 K/uL   Immature Granulocytes 3 %   Abs Immature Granulocytes 0.34 (H) 0.00 - 0.07 K/uL    Comment: Performed at [facility], [place] [address] [place], [place] [id number]  Basic metabolic panel     Status: Abnormal   Collection Time: 09/22/21  5:37 AM  Result Value Ref Range   Sodium 135 135 - 145 mmol/L   Potassium 3.4 (L) 3.5 - 5.1 mmol/L   Chloride 100 98 - 111 mmol/L   CO2 26 22 - 32 mmol/L   Glucose, Bld 82 70 - 99 mg/dL    Comment: Glucose reference range applies only to samples taken after fasting for at least 8 hours.   BUN 6 6 - 20 mg/dL   Creatinine, Ser 0.54 0.44 - 1.00 mg/dL   Calcium 8.6 (L) 8.9 - 10.3 mg/dL   GFR, Estimated >60 >60 mL/min    Comment: (NOTE) Calculated using the CKD-EPI Creatinine Equation (2021)    Anion gap 9 5 - 15    Comment: Performed at [facility], [place] [address], [place], [place] [address]  Magnesium     Status: None   Collection Time: 09/22/21  5:37 AM  Result Value Ref Range   Magnesium 2.0 1.7 - 2.4 mg/dL    Comment: Performed at [facility], [place] [address], [place], [place] [address]  CBC     Status: Abnormal   Collection Time: 09/23/21  5:11 AM  Result Value Ref Range   WBC 11.0 (H) 4.0 - 10.5 K/uL   RBC 4.43 3.87 - 5.11 MIL/uL   Hemoglobin 14.0 12.0 - 15.0 g/dL   HCT 43.6 36.0 - 46.0 %  MCV 98.4 80.0 - 100.0 fL   MCH 31.6 26.0 - 34.0 pg   MCHC 32.1 30.0 - 36.0 g/dL   RDW 12.8 11.5 - 15.5 %   Platelets 272 150 - 400 K/uL   nRBC 0.0 0.0 - 0.2 %    Comment: Performed at [facility], [place] [address] [place], [place] [id number]  Basic metabolic panel     Status: Abnormal   Collection Time: 09/23/21  5:11 AM  Result Value Ref Range   Sodium 135 135 - 145 mmol/L   Potassium 4.0 3.5 - 5.1 mmol/L   Chloride 101 98 - 111 mmol/L   CO2 24 22 - 32 mmol/L   Glucose, Bld 81 70 - 99 mg/dL    Comment: Glucose reference range applies only to samples taken after fasting for at least 8 hours.   BUN 5 (L) 6 - 20 mg/dL   Creatinine, Ser 0.80 0.44 - 1.00 mg/dL   Calcium 8.8 (L) 8.9 - 10.3 mg/dL   GFR,  Estimated >60 >60 mL/min    Comment: (NOTE) Calculated using the CKD-EPI Creatinine Equation (2021)    Anion gap 10 5 - 15    Comment: Performed at [facility], [place] [address] [place], [place] [address]    Imaging / Studies: No results found.  Medications / Allergies: per chart  Antibiotics: Anti-infectives (From admission, onward)    Start     Dose/Rate Route Frequency Ordered Stop   09/18/21 1400  anidulafungin (ERAXIS) 100 mg in sodium chloride 0.9 % 100 mL IVPB        100 mg 78 mL/hr over 100 Minutes Intravenous Every 24 hours 09/17/21 2117 09/21/21 1715   09/18/21 0000  piperacillin-tazobactam (ZOSYN) IVPB 3.375 g        3.375 g 12.5 mL/hr over 240 Minutes Intravenous Every 8 hours 09/17/21 2117 09/23/21 0206   09/17/21 1830  clindamycin (CLEOCIN) 900 mg, gentamicin (GARAMYCIN) 240 mg in sodium chloride 0.9 % 1,000 mL for intraperitoneal lavage  Status:  Discontinued          As needed 09/17/21 1850 09/17/21 2117   09/17/21 1500  clindamycin (CLEOCIN) 900 mg, gentamicin (GARAMYCIN) 240 mg in sodium chloride 0.9 % 1,000 mL for intraperitoneal lavage  Status:  Discontinued       Note to Pharmacy: Have in the  [facility] room for final irrigation in bowel surgery case to minimize risk of abscess/infection Pharmacy may adjust dosing strength, schedule, rate of infusion, etc as needed to optimize therapy    Irrigation To Surgery 09/17/21 1448 09/17/21 2117   09/17/21 1400  anidulafungin (ERAXIS) 100 mg in sodium chloride 0.9 % 100 mL IVPB  Status:  Discontinued        100 mg 78 mL/hr over 100 Minutes Intravenous Every 24 hours 09/16/21 1352 09/17/21 2117   09/17/21 0600  cefoTEtan (CEFOTAN) 2 g in sodium chloride 0.9 % 100 mL IVPB        2 g 200 mL/hr over 30 Minutes Intravenous On call to O.R. 09/16/21 0835 09/17/21 1513   09/16/21 1500  anidulafungin (ERAXIS) 200 mg in sodium chloride 0.9 % 200 mL IVPB        200 mg 78 mL/hr over 200 Minutes Intravenous  Once  09/16/21 1400 09/16/21 2338   09/16/21 1400  neomycin (MYCIFRADIN) tablet 1,000 mg  Status:  Discontinued       See Hyperspace for full Linked Orders Report.   1,000 mg Oral 3  times per day 09/16/21 0835 09/16/21 1107   09/16/21 1400  metroNIDAZOLE (FLAGYL) tablet 1,000 mg       See Hyperspace for full Linked Orders Report.   1,000 mg Oral 3 times per day 09/16/21 0835 09/16/21 2008   09/16/21 1247  neomycin (MYCIFRADIN) tablet 500 mg  Status:  Discontinued        500 mg Oral As directed 09/16/21 1247 09/16/21 1254   09/11/21 1600  piperacillin-tazobactam (ZOSYN) IVPB 3.375 g  Status:  Discontinued        3.375 g 12.5 mL/hr over 240 Minutes Intravenous Every 8 hours 09/11/21 1538 09/17/21 2117   09/11/21 0645  piperacillin-tazobactam (ZOSYN) IVPB 3.375 g        3.375 g 100 mL/hr over 30 Minutes Intravenous  Once 09/11/21 [id number] 09/11/21 0729         Note: Portions of this report may have been transcribed using voice recognition software. Every effort was made to ensure accuracy; however, inadvertent computerized transcription errors may be present.   Any transcriptional errors that result from this process are unintentional.    [name], MD, FACS, MASCRS Esophageal, Gastrointestinal & Colorectal Surgery Robotic and Minimally Invasive Surgery  [facility], [place]  [place] [address] [address], [place], [place] [address] [phone] Fax [phone] Main  CONTACT INFORMATION:  Weekday (9AM-5PM): Call [facility] main office at [phone]  Weeknight (5PM-9AM) or Weekend/Holiday: Check [email or web address] (password " [email or web address]") for General Surgery [facility] coverage  (Please, do not use SecureChat as it is not reliable communication to operating surgeons for immediate patient care)      09/23/2021  8:03 AM

## 2021-09-24 DIAGNOSIS — Z933 Colostomy status: Secondary | ICD-10-CM

## 2021-09-24 MED ORDER — ACETAMINOPHEN 500 MG PO TABS
1000.0000 mg | ORAL_TABLET | Freq: Four times a day (QID) | ORAL | 0 refills | Status: AC
Start: 2021-09-24 — End: 2021-10-01

## 2021-09-24 MED ORDER — PANTOPRAZOLE SODIUM 40 MG PO TBEC: 40.0000 mg | DELAYED_RELEASE_TABLET | Freq: Every day | ORAL | 1 refills | Status: DC

## 2021-09-24 MED ORDER — PROCHLORPERAZINE MALEATE 10 MG PO TABS
10.0000 mg | ORAL_TABLET | Freq: Four times a day (QID) | ORAL | 0 refills | Status: DC | PRN
Start: 2021-09-24 — End: 2022-02-05

## 2021-09-24 MED ORDER — LEVOTHYROXINE SODIUM 88 MCG PO TABS: 88.0000 ug | ORAL_TABLET | Freq: Every day | ORAL | 1 refills | Status: DC

## 2021-09-24 NOTE — Progress Notes (Signed)
°   09/24/21 1300  Mobility  Activity Ambulated in hall  Level of Assistance Standby assist, set-up cues, supervision of patient - no hands on  Assistive Device Other (Comment) (IV pole)  Distance Ambulated (ft) 200 ft  Mobility Ambulated with assistance in hallway  Mobility Response Tolerated well  Mobility performed by Mobility specialist  $Mobility charge 1 Mobility   Pt agreeable to mobilize this afternoon. She stated she was experiencing 7/10 pain prior to session, but wanted to proceed. Ambulated about 267ft in hall while pushing IV pole, tolerated well. No complaints aside for her pain. Left pt in bathroom, and informed her to pull the cord on the wall if in need of assistance. Pt requested pain meds. Notified RN of session, pt location, and request.   [facility] Mobility Specialist Acute Rehab Services Office: [phone]

## 2021-09-24 NOTE — Discharge Summary (Signed)
Physician Discharge Summary  Lori Mcknight ZOX:[id number] DOB: 23-Jan-1986 DOA: 09/11/2021  PCP: Claiborne Rigg, NP  Admit date: 09/11/2021 Discharge date: 09/24/2021  Time spent: 60 minutes  Recommendations for Outpatient Follow-up:  Follow-up with Dr. Michaell Cowing, general surgery in 1 month. Follow-up with Claiborne Rigg, NP in 2 to 3 weeks.  On follow-up patient will need a basic metabolic profile, magnesium level done to follow-up on electrolytes and renal function.  Patient need a CBC done to follow-up on H&H.  Patient's hypothyroidism will need to be followed up upon and patient will need repeat thyroid function studies done in 4 to 6 weeks. ??  PCOS will need to be followed up upon.   Discharge Diagnoses:  Principal Problem:   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 Active Problems:   Essential hypertension   Hypothyroid   Anemia   Obesity, morbid, BMI 50 or higher (HCC)   Endometriosis s/p ablation   Family history of colon cancer in father   Hypokalemia   Asthma, mild intermittent   Hypomagnesemia   Fecal peritonitis (HCC)   Intra-abdominal abscess s/p robotic drainage 09/18/2021   Colostomy in place [facility])   Discharge Condition: Stable and improved  Diet recommendation: Heart healthy  Filed Weights   09/23/21 0500 09/23/21 2018 09/24/21 0101  Weight: (!) 176.9 kg (!) 174.6 kg (!) 174.6 kg    History of present illness:  HPI per Dr. Gerri Lins The patient is a 35 yr old woman who has had multiple recurrences of diverticulitis with abscess. She was seen earlier this month with the abscess, but it was felt to be too small for intervention. She was placed on PO Augmentin and sent home. In the last 2 days she has developed crampy suprapubic pain. These have become more intense. On presentation the patient is rolling about in the bed complaining of severe lower abdominal pain. CT of the abdomen and pelvis demonstrated increase in size of the abscess.  The patient was placed on IV zosyn, blood cultures x 2 were drawn, and she was transferred to [facility] for further evaluation and treatment.   The patient admits to fevers, but no chills. She has had vomiting and states that she has only been able to keep down liquids and that she throws everything else up. No diarrhea, no constipation, no cough, sputum production, shortness of breath. No neurological changes, choryza, chest pain, back pain, dysuria, rashes, sores, or lesions.   Past medical history is significant for: Super-morbid obesity (BMI 58.11), Depression/anxiety, anemia, endometriosis, genital herpes, hypertension, hypothyroidism, PCOS, DOE.   The patient has been admitted to a medical bed. She is receiving pain control and antiemetics.   General surgery was consulted. I have discussed the patient with Dr. Cliffton Asters. He states that currently the patient's abscess, although increased in size, remains to small for percutaneous drainage. Furthermore, given the patient's body habitus there is high risk of the complication in having the drain being able to reach the skin. He does not recommend an open procedure at this time due to acute infection and again risk of serious complication. He recommends treatment with IV antibiotics and monitoring. As the abscess appears to be sitting between the sigmoid colon and the bladder, her feels that a fistula is likely to form allowing for a natural drainage of the abscess. He does fee that she will need to have a partial colon resection at some time in the future as she has had multiple recurrences of the problem.  Hospital Course:  #1 acute diverticulitis of the large intestine with abscess formation/perforated sigmoid colon likely secondary to diverticulitis/feculent peritonitis with abscesses x3 -Patient noted to have recurrent episodes of acute diverticulitis. -Dr. Gerri Lins discussed with general surgery, Dr. Cliffton Asters, noting that abscess had increased in size  however remained too small for percutaneous drainage. -Due to patient's body habitus it was felt patient was high risk for complications and having drain being able to reach the skin. -General surgery following initially recommended treatment with IV antibiotics and watchful waiting. -No abscess sitting between sigmoid colon and bladder likelihood of fistula formation allowing for natural drainage of abscess. -Patient's condition continued to worsen with worsening pneumoperitoneum and interval development of free fluid and gas along with known segment of mid sigmoid diverticulitis. -Patient followed by general surgery and subsequently underwent robotic assisted sigmoid colectomy with cystoscopy with firefly injection 09/17/2021. -Patient with no flatus, no bowel movements, complains of lower abdominal pain early on in the hospitalization which improved as patient started to have flatus, bowel movements, some improvement with lower abdominal pain. -Status post 5 days IV Zosyn and IV Eraxis. -Tolerating current diet/soft diet however poor appetite. -Placed on MS IR 15 mg p.o. every 4 hours as needed, patient also noted to be on Ultram and on scheduled Tylenol.  -Patient with discharge home on scheduled Tylenol.  -Ultram prescribed by general surgery for pain management as needed.  -Outpatient follow-up with general surgery.   2.  Staph epidermis bacteremia -Likely secondary to contamination.   -Repeat blood cultures negative.   -No further work-up needed.    3.  Morbid obesity -Lifestyle changes.   -Outpatient follow-up with PCP.    4.  Hypertension -Improved.   -Elevated blood pressure likely secondary to pain.   -Outpatient follow-up.  5.  Hypothyroidism -TSH obtained on 09/18/2021 was 15.46. -Patient stated had run out and had been off Synthroid for approximately a month. -Patient mains on Synthroid 65 MCG's daily and will need outpatient follow-up with PCP with repeat TFTs in 4 to 6  weeks.  6.  Depression/anxiety -Stable.   -Not on antidepressants.   -Outpatient follow-up.    7.  Hypomagnesemia -Repleted.  8.  Leukocytosis -Patient with a worsening leukocytosis postoperatively, likely reactive in the setting of acute diverticulitis with perforation.  -Leukocytosis trended down with IV antibiotics.  -Patient remained afebrile. -Status post IV Zosyn, IV Eraxis.   -Outpatient follow-up  9.  History of PCOS -Per general surgery during surgery no significant findings concerning for PCOS. -Outpatient follow-up with PCP.  10.  Chest tightness -Resolved.  11.  Hypokalemia -Repleted.       Procedures: Cystoscopy with bilateral ureteral catheterization and firefly injection per Dr.Machen, urology 09/17/2021 Robotic assisted sigmoid colectomy with end colostomy/mobilization of splenic flexure of colon/drainage of abscesses x3/robotic lysis of adhesions/transversus abdominis plane block bilaterally per Dr. Michaell Cowing, general surgery 09/17/2021 CT abdomen and pelvis 09/11/2021, 09/14/2021 2D echo 09/14/2021    Consultations: WOC RN: Cammie Mcgee, RN 09/18/2021 WOCN RN Lori McNicol's, RN 09/16/2021 General surgery: Dr. Cliffton Asters 09/11/2021  Discharge Exam: Vitals:   09/24/21 0309 09/24/21 1247  BP: 136/89 140/85  Pulse: 88 92  Resp: 20 20  Temp: 98.9 F (37.2 C) (!) 97.3 F (36.3 C)  SpO2: 98% 100%    General: NAD Cardiovascular: RRR Respiratory: CTAB.  No wheezes, no crackles, no rhonchi.  Normal respiratory effort  Discharge Instructions   Discharge Instructions     Call MD for:   Complete by: As directed  FEVER > 101.5 F  (temperatures < 101.5 F are not significant)   Call MD for:  extreme fatigue   Complete by: As directed    Call MD for:  persistant dizziness or light-headedness   Complete by: As directed    Call MD for:  persistant nausea and vomiting   Complete by: As directed    Call MD for:  redness, tenderness, or signs of infection (pain,  swelling, redness, odor or green/yellow discharge around incision site)   Complete by: As directed    Call MD for:  severe uncontrolled pain   Complete by: As directed    Diet - low sodium heart healthy   Complete by: As directed    Start with a bland diet such as soups, liquids, starchy foods, low fat foods, etc. the first few days at home. Gradually advance to a solid, low-fat, high fiber diet by the end of the first week at home.   Add a fiber supplement to your diet (Metamucil, etc) If you feel full, bloated, or constipated, stay on a full liquid or pureed/blenderized diet for a few days until you feel better and are no longer constipated.   Discharge instructions   Complete by: As directed    See Discharge Instructions If you are not getting better after two weeks or are noticing you are getting worse, contact our office [phone] for further advice.  We may need to adjust your medications, re-evaluate you in the office, send you to the emergency room, or see what other things we can do to help. The clinic staff is available to answer your questions during regular business hours (8:30am-5pm).  Please don't hesitate to call and ask to speak to one of our nurses for clinical concerns.    A surgeon from [facility] is always on call at the hospitals 24 hours/day If you have a medical emergency, go to the nearest emergency room or call 911.   Discharge wound care:   Complete by: As directed    It is good for closed incisions and even open wounds to be washed every day.  Shower every day.  Short baths are fine.  Wash the incisions and wounds clean with soap & water.    You may leave closed incisions open to air if it is dry.   You may cover the incision with clean gauze & replace it after your daily shower for comfort.  SEE OSTOMY INSTRUCTIONS   Discharge wound care:   Complete by: As directed    As per General Surgery. As above.   Driving Restrictions   Complete by: As  directed    You may drive when: - you are no longer taking narcotic prescription pain medication - you can comfortably wear a seatbelt - you can safely make sudden turns/stops without pain.   Increase activity slowly   Complete by: As directed    Start light daily activities --- self-care, walking, climbing stairs- beginning the day after surgery.  Gradually increase activities as tolerated.  Control your pain to be active.  Stop when you are tired.  Ideally, walk several times a day, eventually an hour a day.   Most people are back to most day-to-day activities in a few weeks.  It takes 4-6 weeks to get back to unrestricted, intense activity. If you can walk 30 minutes without difficulty, it is safe to try more intense activity such as jogging, treadmill, bicycling, low-impact aerobics, swimming, etc. Save the most  intensive and strenuous activity for last (Usually 4-8 weeks after surgery) such as sit-ups, heavy lifting, contact sports, etc.  Refrain from any intense heavy lifting or straining until you are off narcotics for pain control.  You will have off days, but things should improve week-by-week. DO NOT PUSH THROUGH PAIN.  Let pain be your guide: If it hurts to do something, don't do it.   Increase activity slowly   Complete by: As directed    Lifting restrictions   Complete by: As directed    If you can walk 30 minutes without difficulty, it is safe to try more intense activity such as jogging, treadmill, bicycling, low-impact aerobics, swimming, etc. Save the most intensive and strenuous activity for last (Usually 4-8 weeks after surgery) such as sit-ups, heavy lifting, contact sports, etc.   Refrain from any intense heavy lifting or straining until you are off narcotics for pain control.  You will have off days, but things should improve week-by-week. DO NOT PUSH THROUGH PAIN.  Let pain be your guide: If it hurts to do something, don't do it.  Pain is your body warning you to avoid that  activity for another week until the pain goes down.   May shower / Bathe   Complete by: As directed    May walk up steps   Complete by: As directed    Sexual Activity Restrictions   Complete by: As directed    You may have sexual intercourse when it is comfortable. If it hurts to do something, stop.      Allergies as of 09/24/2021       Reactions   Dilaudid [hydromorphone] Itching   Tolerates morphine fine        Medication List     STOP taking these medications    amoxicillin-clavulanate 875-125 MG tablet Commonly known as: AUGMENTIN   docusate sodium 100 MG capsule Commonly known as: Colace   ibuprofen 200 MG tablet Commonly known as: ADVIL   medroxyPROGESTERone 10 MG tablet Commonly known as: PROVERA       TAKE these medications    acetaminophen 500 MG tablet Commonly known as: TYLENOL Take 2 tablets (1,000 mg total) by mouth every 6 (six) hours for 7 days. What changed:  medication strength how much to take when to take this reasons to take this   albuterol 108 (90 Base) MCG/ACT inhaler Commonly known as: VENTOLIN HFA Inhale 2 puffs into the lungs every 6 (six) hours as needed for wheezing or shortness of breath.   cyclobenzaprine 10 MG tablet Commonly known as: FLEXERIL Take 1 tablet (10 mg total) by mouth at bedtime.   gabapentin 300 MG capsule Commonly known as: NEURONTIN Take 1 capsule (300 mg total) by mouth 3 (three) times daily. Increase to 4x/day as needed   levothyroxine 88 MCG tablet Commonly known as: SYNTHROID Take 1 tablet (88 mcg total) by mouth daily before breakfast. What changed: Another medication with the same name was removed. Continue taking this medication, and follow the directions you see here.   naproxen sodium 220 MG tablet Commonly known as: ALEVE Take 440 mg by mouth 2 (two) times daily as needed (pain/headache).   pantoprazole 40 MG tablet Commonly known as: PROTONIX Take 1 tablet (40 mg total) by mouth  daily. Start taking on: September 25, 2021   polyethylene glycol 17 g packet Commonly known as: MIRALAX / GLYCOLAX Take 17 g by mouth 2 (two) times daily as needed for moderate constipation or severe constipation.  prochlorperazine 10 MG tablet Commonly known as: COMPAZINE Take 1 tablet (10 mg total) by mouth every 6 (six) hours as needed for refractory nausea / vomiting (Use for nausea and / or vomiting unresolved with ondansetron (Zofran)).   traMADol 50 MG tablet Commonly known as: ULTRAM Take 1-2 tablets (50-100 mg total) by mouth every 6 (six) hours as needed for moderate pain or severe pain.               Durable Medical Equipment  (From admission, onward)           Start     Ordered   09/23/21 1152  For home use only DME Walker wide  Once       Comments: barriatric patient over 350 lbs  Question:  Patient needs a walker to treat with the following condition  Answer:  Weakness   09/23/21 1153   09/20/21 1059  For home use only DME 3 n 1  Once       Comments: Bariatric   09/20/21 1058              Discharge Care Instructions  (From admission, onward)           Start     Ordered   09/24/21 0000  Discharge wound care:       Comments: As per General Surgery. As above.   09/24/21 1601   09/23/21 0000  Discharge wound care:       Comments: It is good for closed incisions and even open wounds to be washed every day.  Shower every day.  Short baths are fine.  Wash the incisions and wounds clean with soap & water.    You may leave closed incisions open to air if it is dry.   You may cover the incision with clean gauze & replace it after your daily shower for comfort.  SEE OSTOMY INSTRUCTIONS   09/23/21 0815           Allergies  Allergen Reactions   Dilaudid [Hydromorphone] Itching    Tolerates morphine fine    Follow-up Information     Karie Soda, MD Follow up in 1 month(s).   Specialties: General Surgery, Colon and Rectal Surgery Why: To  follow up after your operation, To follow up after your hospital stay Contact information: [address] [place] [address] [phone]         Claiborne Rigg, NP. Schedule an appointment as soon as possible for a visit in 2 week(s).   Specialty: Nurse Practitioner Why: f/u in 2-3 weeks. Contact information: [address] [place] [address] [phone]                  The results of significant diagnostics from this hospitalization (including imaging, microbiology, ancillary and laboratory) are listed below for reference.    Significant Diagnostic Studies: DG Chest 2 View  Result Date: 08/28/2021 CLINICAL DATA:  Shortness of breath EXAM: CHEST - 2 VIEW COMPARISON:  Chest radiograph 01/06/2017 FINDINGS: The cardiomediastinal silhouette is within normal limits. There is no focal consolidation or pulmonary edema. There is no pleural effusion or pneumothorax. There is no acute osseous abnormality. IMPRESSION: No radiographic evidence of acute cardiopulmonary process. Electronically Signed   By: Lesia Hausen M.D.   On: 08/28/2021 14:13   CT ABDOMEN PELVIS W CONTRAST  Addendum Date: 09/14/2021   ADDENDUM REPORT: 09/14/2021 16:48 ADDENDUM: These results were called by telephone at the time of  interpretation on 09/14/2021 at 4:44 pm to provider Kathlen Mody MD, who verbally acknowledged these results. Electronically Signed   By: Tish Frederickson M.D.   On: 09/14/2021 16:48   Result Date: 09/14/2021 CLINICAL DATA:  Diverticulitis. F/u abscess after iv antibiotics Best imaging due to body habitus EXAM: CT ABDOMEN AND PELVIS WITH CONTRAST TECHNIQUE: Multidetector CT imaging of the abdomen and pelvis was performed using the standard protocol following bolus administration of intravenous contrast. CONTRAST:  OMNIPAQUE IOHEXOL 350 MG/ML SOLN COMPARISON:  CT abdomen pelvis 08/11/2021, CT abdomen pelvis 05/20/2021, CT abdomen pelvis 05/09/2021, CT abdomen pelvis  03/26/2020 FINDINGS: Lower chest: Interval development of a trace right pleural effusion. Associated passive atelectasis of right lower lobe. Bibasilar subsegmental atelectasis. Hepatobiliary: No focal liver abnormality. No gallstones, gallbladder wall thickening, or pericholecystic fluid. No biliary dilatation. Pancreas: No focal lesion. Normal pancreatic contour. No surrounding inflammatory changes. No main pancreatic ductal dilatation. Spleen: Normal in size without focal abnormality. Adrenals/Urinary Tract: No adrenal nodule bilaterally. Bilateral kidneys enhance symmetrically. No hydronephrosis. No hydroureter. The urinary bladder is unremarkable. No gas noted within the urinary bladder lumen or wall. Stomach/Bowel: Stomach is within normal limits. No evidence of bowel wall thickening or dilatation. Persistent marked bowel wall thickening and pericolonic fat stranding of the mid sigmoid colon in the setting of colonic diverticulosis. Associated perforation is again noted (2:71). Appendix appears normal. Vascular/Lymphatic: No abdominal aorta or iliac aneurysm. Mild atherosclerotic plaque of the aorta and its branches. Persistent prominent/borderline enlarged left retroperitoneal lymph nodes measuring up to 1.2 cm. No pelvic or inguinal lymphadenopathy. Reproductive: Uterus and bilateral adnexa are unremarkable. Other: Interval increase in of small volume free intraperitoneal gas. Interval increase in trace free fluid within the pelvis. Interval development of a region of free fluid and gas along the known segment of mid sigmoid diverticulitis likely representing a developing organized fluid collection/abscess. Interval increase in size of an abscess formation along the urinary bladder dome that now measures measures 1.9 cm (from 1.1 cm). Musculoskeletal: No abdominal wall hernia or abnormality. No suspicious lytic or blastic osseous lesions. No acute displaced fracture. Degenerative changes of the spine with  intervertebral disc space vacuum phenomenon, posterior disc osteophyte complex, mild endplate sclerosis, osteophyte formation at the L5-S1 level. IMPRESSION: 1. Interval worsening of disease in a patient with complicated sigmoid diverticulitis. 2. Interval increase in small volume pneumoperitoneum consistent with perforation. 3. Interval development of a region of free fluid and gas along the known segment of mid sigmoid diverticulitis likely representing a developing organized fluid collection/abscess. Please consider use of PO and IV contrast in following CT scans in this patient. 4. Interval increase in size of a 1.9 cm (from 1.1 cm) abscess along the urinary bladder dome. 5. Persistent prominent/borderline enlarged left retroperitoneal lymph nodes measuring up to 1.2 cm. Findings may be reactive in etiology. Recommend attention on follow-up. 6. Recommend colonoscopy status post treatment and status post complete resolution of inflammatory changes to exclude an underlying lesion. 7. Interval development of trace right pleural effusion. Electronically Signed: By: Tish Frederickson M.D. On: 09/14/2021 16:44   CT ABDOMEN PELVIS W CONTRAST  Result Date: 09/11/2021 CLINICAL DATA:  Left lower quadrant abdominal pain. EXAM: CT ABDOMEN AND PELVIS WITH CONTRAST TECHNIQUE: Multidetector CT imaging of the abdomen and pelvis was performed using the standard protocol following bolus administration of intravenous contrast. CONTRAST:  OMNIPAQUE IOHEXOL 300 MG/ML  SOLN COMPARISON:  08/28/2021 FINDINGS: Lower chest: Unremarkable Hepatobiliary: No suspicious focal abnormality within the liver parenchyma.  There is no evidence for gallstones, gallbladder wall thickening, or pericholecystic fluid. No intrahepatic or extrahepatic biliary dilation. Pancreas: No focal mass lesion. No dilatation of the main duct. No intraparenchymal cyst. No peripancreatic edema. Spleen: No splenomegaly. No focal mass lesion. Adrenals/Urinary  Tract: No adrenal nodule or mass. Kidneys unremarkable. No evidence for hydroureter. As noted previously, there is focal bladder wall thickening towards the anterior dome, adjacent to the sigmoid colon. Stomach/Bowel: Stomach is unremarkable. No gastric wall thickening. No evidence of outlet obstruction. Duodenum is normally positioned as is the ligament of Treitz. No small bowel wall thickening. No small bowel dilatation. The terminal ileum is normal. Wall thickening and pericolonic edema/inflammation associated with the mid sigmoid colon is progressive since 08/28/2021. Extraluminal gas is now seen in the pericolonic fat cranial to the sigmoid colon (coronal image 84/series 5). Extraluminal gas dissects from the inferior wall the sigmoid colon into the wall of the bladder dome where there is a 2.7 x 1.7 cm collection of gas and fluid in focal area of marked bladder wall thickening (well demonstrated coronal image 78/5 and sagittal image 79 of series 6). Vascular/Lymphatic: No abdominal aortic aneurysm mild left para-aortic lymphadenopathy (image 40/2) is progressive in the interval with mild lymphadenopathy in the left common iliac chain. Reproductive: Unremarkable. Other: Small volume intraperitoneal free fluid. Musculoskeletal: No worrisome lytic or sclerotic osseous abnormality. Degenerative changes noted L5-S1 disc. IMPRESSION: 1. Interval progression of wall thickening and pericolonic edema/inflammation associated with the mid sigmoid colon since 08/28/2021. New extraluminal gas is now seen in the pericolonic fat consistent with micro perforation. There is also evidence for dissection of inflammatory process from the inferior wall of the sigmoid colon into the bladder dome where there is a 2.7 x 1.7 cm intramural collection of gas and fluid suggesting bladder wall abscess. 2. Interval development of mild left para-aortic and left common iliac chain lymphadenopathy, likely reactive. 3. Small volume  intraperitoneal free fluid. Electronically Signed   By: [name] M.D.   On: 09/11/2021 06:09   CT Abdomen Pelvis W Contrast  Result Date: 08/28/2021 CLINICAL DATA:  Chest pain, shortness of breath, abdominal infection suspected EXAM: CT ABDOMEN AND PELVIS WITH CONTRAST TECHNIQUE: Multidetector CT imaging of the abdomen and pelvis was performed using the standard protocol following bolus administration of intravenous contrast. CONTRAST:  OMNIPAQUE IOHEXOL 350 MG/ML SOLN COMPARISON:  07/13/2021 FINDINGS: Lower chest: The lung bases are clear. No pleural effusion or pericardial effusion. Hepatobiliary: No focal liver abnormality is seen. No gallstones, gallbladder wall thickening, or biliary dilatation. Pancreas: Unremarkable. No pancreatic ductal dilatation or surrounding inflammatory changes. Spleen: Normal in size without focal abnormality. Adrenals/Urinary Tract: Adrenal glands are unremarkable. Kidneys are normal, without renal calculi, focal lesion, or hydronephrosis. Thickening of the superior wall of the bladder, measuring up to 13 mm, similar to the prior exam, likely reactive secondary to the adjacent fluid collection. No definite visualized seen between the fluid collection in the bladder. Stomach/Bowel: Redemonstrated changes related to sigmoid diverticulitis, with redemonstrated pericolonic fluid collection, which now measures up to 1.9 x 1.0 x 1.2 cm (AP x TR x CC) (series 6, image 81 and series 2, image 79), previously 2.2 x 1.6 x 1.8 cm when remeasured similarly. Colon is otherwise within normal limits. Vascular/Lymphatic: No significant vascular findings are present. No enlarged abdominal or pelvic lymph nodes. Reproductive: Uterus and bilateral adnexa are unremarkable. Other: No abdominal wall hernia or abnormality. No abdominopelvic ascites. Musculoskeletal: No acute or significant osseous findings. IMPRESSION: Redemonstrated  changes of diverticulitis, with a small abscess adjacent to  the sigmoid colon, which is decreased in size compared to prior exam. The abscess is adjacent to the superior surface of the bladder, with focal bladder wall thickening, likely reactive, without definite fistula. No other acute process in the abdomen or pelvis. Electronically Signed   By: Wiliam Ke M.D.   On: 08/28/2021 16:19   ECHOCARDIOGRAM COMPLETE  Result Date: 09/14/2021    ECHOCARDIOGRAM REPORT   Patient Name:   Lori C Garay Date of Exam: 09/14/2021 Medical Rec #:  [id number]    Height:       66.0 in Accession #:    [id number]   Weight:       360.0 lb Date of Birth:  01/20/1986    BSA:          2.567 m Patient Age:    35 years     BP:           136/80 mmHg Patient Gender: F            HR:           94 bpm. Exam Location:  Inpatient Procedure: 2D Echo, Cardiac Doppler, Color Doppler and Intracardiac            Opacification Agent Indications:    Bacteremia  History:        Patient has no prior history of Echocardiogram examinations.                 Signs/Symptoms:Shortness of Breath; Risk Factors:Hypertension.  Sonographer:    Eulah Pont RDCS Referring Phys: Herminio Heads  Sonographer Comments: Patient is morbidly obese. IMPRESSIONS  1. Technically difficult echo with poor image quality.  2. Left ventricular ejection fraction, by estimation, is 65 to 70%. The left ventricle has normal function. The left ventricle has no regional wall motion abnormalities. Left ventricular diastolic parameters were normal.  3. Right ventricular systolic function is normal. The right ventricular size is normal.  4. The mitral valve was not well visualized. Trivial mitral valve regurgitation.  5. The aortic valve was not well visualized. Aortic valve regurgitation is not visualized. FINDINGS  Left Ventricle: Left ventricular ejection fraction, by estimation, is 65 to 70%. The left ventricle has normal function. The left ventricle has no regional wall motion abnormalities. Definity contrast agent was given IV to  delineate the left ventricular  endocardial borders. The left ventricular internal cavity size was small. There is no left ventricular hypertrophy. Left ventricular diastolic parameters were normal. Right Ventricle: The right ventricular size is normal. Right vetricular wall thickness was not well visualized. Right ventricular systolic function is normal. Left Atrium: Left atrial size was normal in size. Right Atrium: Right atrial size was normal in size. Pericardium: There is no evidence of pericardial effusion. Mitral Valve: The mitral valve was not well visualized. Trivial mitral valve regurgitation. Tricuspid Valve: The tricuspid valve is not well visualized. Tricuspid valve regurgitation is not demonstrated. Aortic Valve: The aortic valve was not well visualized. Aortic valve regurgitation is not visualized. Pulmonic Valve: The pulmonic valve was not well visualized. Pulmonic valve regurgitation is not visualized. Aorta: The aortic root and ascending aorta are structurally normal, with no evidence of dilitation. IAS/Shunts: The interatrial septum was not well visualized. Additional Comments: Technically difficult echo with poor image quality.  LEFT VENTRICLE PLAX 2D LVIDd:         4.00 cm   Diastology LVIDs:  2.60 cm   LV e' medial:    7.51 cm/s LV PW:         1.10 cm   LV E/e' medial:  9.8 LV IVS:        1.10 cm   LV e' lateral:   9.79 cm/s LVOT diam:     2.10 cm   LV E/e' lateral: 7.5 LV SV:         47 LV SV Index:   18 LVOT Area:     3.46 cm  RIGHT VENTRICLE RV S prime:     13.50 cm/s TAPSE (M-mode): 1.8 cm LEFT ATRIUM           Index        RIGHT ATRIUM           Index LA diam:      3.60 cm 1.40 cm/m   RA Area:     20.70 cm LA Vol (A4C): 58.5 ml 22.79 ml/m  RA Volume:   63.20 ml  24.62 ml/m  AORTIC VALVE LVOT Vmax:   92.10 cm/s LVOT Vmean:  58.700 cm/s LVOT VTI:    0.137 m  AORTA Ao Root diam: 3.20 cm Ao Asc diam:  3.30 cm MITRAL VALVE MV Area (PHT): 5.75 cm    SHUNTS MV Decel Time: 132 msec     Systemic VTI:  0.14 m MV E velocity: 73.70 cm/s  Systemic Diam: 2.10 cm MV A velocity: 75.00 cm/s MV E/A ratio:  0.98 Kristeen Miss MD Electronically signed by Kristeen Miss MD Signature Date/Time: 09/14/2021/3:23:52 PM    Final     Microbiology: No results found for this or any previous visit (from the past 240 hour(s)).   Labs: Basic Metabolic Panel: Recent Labs  Lab 09/18/21 0539 09/19/21 0534 09/20/21 0647 09/21/21 0606 09/22/21 0537 09/23/21 0511  NA 135 132* 130* 133* 135 135  K 3.8 4.0 3.7 3.1* 3.4* 4.0  CL 103 101 99 99 100 101  CO2 23 21* GLUCOSE 131* 128* 99 99 82 81  BUN <5* <5* 5*  CREATININE 0.71 0.76 0.70 0.67 0.54 0.80  CALCIUM 8.5* 8.4* 8.3* 8.4* 8.6* 8.8*  MG 1.5* 2.0 1.9 1.9 2.0  --   PHOS  --   --   --  2.7  --   --    Liver Function Tests: Recent Labs  Lab 09/21/21 0606  ALBUMIN 3.2*   No results for input(s): LIPASE, AMYLASE in the last 168 hours. No results for input(s): AMMONIA in the last 168 hours. CBC: Recent Labs  Lab 09/19/21 0534 09/20/21 0647 09/21/21 0606 09/22/21 0537 09/23/21 0511  WBC 14.8* 13.7* 11.4* 10.3 11.0*  NEUTROABS 11.5* 9.4* 7.7 6.3  --   HGB 15.1* 13.3 13.7 12.5 14.0  HCT 45.5 41.6 42.6 38.8 43.6  MCV 96.8 97.9 97.3 97.0 98.4  PLT 251 226 240 256 272   Cardiac Enzymes: No results for input(s): CKTOTAL, CKMB, CKMBINDEX, TROPONINI in the last 168 hours. BNP: BNP (last 3 results) No results for input(s): BNP in the last 8760 hours.  ProBNP (last 3 results) No results for input(s): PROBNP in the last 8760 hours.  CBG: No results for input(s): GLUCAP in the last 168 hours.     Signed:  Ramiro Harvest MD.  [facility] 09/24/2021, 4:15 PM

## 2021-09-25 ENCOUNTER — Telehealth: Payer: Self-pay

## 2021-09-25 NOTE — Telephone Encounter (Signed)
Transition Care Management Follow-up Telephone Call   Date of discharge and from where: [facility] on 09/24/2021 How have you been since you were released from the hospital? Still in pain , stated has been taking Toradol in the past and have not working for her and if possible to prescribe something else.  Any questions or concerns? concerns reported.  Items Reviewed: Did the pt receive and understand the discharge instructions provided? have the instructions and have no questions.  Medications obtained and verified? She said that have the medication list  and the hospital staff reviewed them in detail prior to discharge. She said that she has all of the medications  Any new allergies since your discharge? None reported  Do you have support at home? Yes, sister Other (ie: DME, Home Health, etc)   Home w Home Health Services     Functional Questionnaire: (I = Independent and D = Dependent) ADL's:  Independent.        Follow up appointments reviewed:   PCP Hospital f/u appt confirmed NP Bertram [place] on 10/02/2021/   [facility] f/u appt confirmed? scheduled at this time  Are transportation arrangements needed? have transportation   If their condition worsens, is the pt aware to call  their PCP or go to the ED? Yes.Made pt aware if condition worsen or start experiencing rapid weight gain, chest pain, diff breathing, SOB, high fevers, or bleading to refer imediately to ED for further evaluation.  Was the patient provided with contact information for the PCP's office or ED? She has the phone number  Was the pt encouraged to call back with questions or concerns?yes

## 2021-09-27 ENCOUNTER — Ambulatory Visit: Payer: Self-pay | Admitting: Nurse Practitioner

## 2021-09-30 ENCOUNTER — Encounter (HOSPITAL_COMMUNITY): Admit: 2021-09-30 | Discharge: 2021-09-30 | Disposition: A | Discharge status: Home / Self Care | Payer: Self-pay

## 2021-09-30 ENCOUNTER — Other Ambulatory Visit: Payer: Self-pay

## 2021-10-02 ENCOUNTER — Ambulatory Visit: Payer: Self-pay | Admitting: Nurse Practitioner

## 2021-10-29 ENCOUNTER — Ambulatory Visit (HOSPITAL_COMMUNITY)
Admission: RE | Admit: 2021-10-29 | Discharge: 2021-10-29 | Disposition: A | Discharge status: Home / Self Care | Payer: Self-pay | Source: Ambulatory Visit | Attending: Nurse Practitioner | Admitting: Nurse Practitioner

## 2021-10-29 DIAGNOSIS — K9400 Colostomy complication, unspecified: Secondary | ICD-10-CM

## 2021-10-29 DIAGNOSIS — E669 Obesity, unspecified: Secondary | ICD-10-CM | POA: Insufficient documentation

## 2021-10-29 DIAGNOSIS — Z433 Encounter for attention to colostomy: Secondary | ICD-10-CM | POA: Insufficient documentation

## 2021-10-29 NOTE — Progress Notes (Signed)
[facility]   Reason for visit:  LLQ colostomy HPI:  Perforated sigmoid s/p colostomy, Hartmann's procedure ROS  Review of Systems  Gastrointestinal:        LLQ colostomy  Skin: Negative.   All other systems reviewed and are negative. Vital signs:  BP (!) 171/75    Pulse (!) 109    Temp 97.6 F (36.4 C) (Oral)    Resp 18    SpO2 100%  Exam:  Physical Exam Vitals reviewed.  Constitutional:      Appearance: She is obese.  Abdominal:     Palpations: Abdomen is soft.  Skin:    General: Skin is warm and dry.  Neurological:     Mental Status: She is alert.  Psychiatric:        Mood and Affect: Mood normal.        Behavior: Behavior normal.    Stoma type/location:  LLQ colostomy Stomal assessment/size:  1 " pink and moist  slightly budded  Peristomal assessment:  intact  round soft abdomen Treatment options for stomal/peristomal skin: Needs convex pouch with filter.   Output: Soft brown stool Ostomy pouching: 1pc.convex with barrier ring and filter.  I applied ostomy belt as well.  Education provided:  Patient receives supplies through [employer] patient assistance.  She is just getting organized with the program.  I have told her to request filtered convex pouches.  She understands the pouches available may be different based on their regulatory guidelines.  She thinks she applied for and was turned down for medicaid. She is on medical leave from her job and not working. I inform her to call social services' office if her employment status changes to reapply as medicaid would cover her supplies.     Impression/dx  Colostomy complication Discussion  Perform pouch change and apply belt.  I explain purpose of convexity and ostomy belt.  Plan  See back in 2 weeks for ongoing education and assistance with supplies.     Visit time: 50 minutes.   Maple Hudson FNP-BC

## 2021-10-31 NOTE — Discharge Instructions (Signed)
Switching to 1 piece convex pouch with filter and ostomy belt.  Contact [employer] to request new pouch with assistance program.

## 2021-11-06 ENCOUNTER — Inpatient Hospital Stay: Admit: 2021-11-06 | Payer: Self-pay | Admitting: Surgery

## 2021-11-06 SURGERY — COLECTOMY, SIGMOID, ROBOT-ASSISTED
Anesthesia: General

## 2021-12-06 ENCOUNTER — Ambulatory Visit (HOSPITAL_COMMUNITY)
Admission: RE | Admit: 2021-12-06 | Discharge: 2021-12-06 | Disposition: A | Discharge status: Home / Self Care | Payer: Self-pay | Source: Ambulatory Visit | Attending: Nurse Practitioner | Admitting: Nurse Practitioner

## 2021-12-06 DIAGNOSIS — K9400 Colostomy complication, unspecified: Secondary | ICD-10-CM | POA: Insufficient documentation

## 2021-12-06 DIAGNOSIS — K5900 Constipation, unspecified: Secondary | ICD-10-CM | POA: Insufficient documentation

## 2021-12-06 DIAGNOSIS — K5909 Other constipation: Secondary | ICD-10-CM

## 2021-12-06 MED ORDER — SORBITOL 70 % SOLN
300.0000 mL | TOPICAL_OIL | Freq: Once | ORAL | Status: DC
  Filled 2021-12-06: qty 90

## 2021-12-06 NOTE — Progress Notes (Signed)
[facility]   Reason for visit:  Constipation. No output from LUQ colostomy x 1 week.   HPI:  Hx diverticular perforation.  Has not followed up with surgery. Has had medicaid pending and now she has a medicaid card.  She will call surgeon, update hospital records. I will assist her to get set up with [facility]  ROS  Review of Systems  All other systems reviewed and are negative. Vital signs:  BP 138/82 (BP Location: Right Arm)    Pulse 90    Temp 97.9 F (36.6 C) (Oral)    Resp 18    SpO2 96%  Exam:  Physical Exam  Stoma type/location:  LUQ colostomy Stomal assessment/size:  1 3/8" round pale pink Peristomal assessment:  I digitally explore the stoma and it is tight, but I am able to get my finger in up and to the left.  I insert the cone and instill a SMOG enema solution via a stomal irrigation kit.  I begin with 150 ML and advance slowly.  After 5 mins, a moderate amount of hard pellets are expelled under pressure.  For the next 10 mins moderate amounts of soft stool are expelled. I instill 200 ml additional solution.  Continued output.  I finish with additional 236m solution.  Slow trickle of brown tinged liquid is all that is emerging now.   Treatment options for stomal/peristomal skin: skin is intact  would like to try 2 piece pouch.  I apply powder, skin prep and barrier ring to peristomal skin. Apply 2 piece pouch.   Output: see above   hard pellets progressing to soft brown stool to liquid brown effluent Ostomy pouching: 2pc. 2 3/4" pouch   Education provided:  We discuss need for her to follow up with her surgeon and PCP.  She has a history of diverticulitis x several episodes.  Does not have any sharp abdominal pain, only dull fullness in her left abdominal region.  She has noted more mucus discharge from her rectum lately    Impression/dx  Constipation with colostomy SMOG enema through colostomy Discussion  See back next week If no improvement  BID  miralax Plan  See back next week.     Visit time: 75 minutes.   KDomenic MorasFNP-BC

## 2021-12-09 NOTE — Discharge Instructions (Signed)
WE have performed an enema through your stoma due to constipation and decreased stool output.  Take miralax twice daily until stools are back to daily or become too thin.   You may notice a small amount of blood in your stool from the enema.  If you do not begin to make stool on your own, call the office.  Call your surgeon's office for follow up (missed post op appointment)  Give them your medicaid information.

## 2021-12-10 ENCOUNTER — Ambulatory Visit (HOSPITAL_COMMUNITY): Payer: Medicaid Other

## 2021-12-16 ENCOUNTER — Ambulatory Visit (HOSPITAL_COMMUNITY): Payer: Medicaid Other

## 2021-12-24 ENCOUNTER — Ambulatory Visit
Admission: EM | Admit: 2021-12-24 | Discharge: 2021-12-24 | Disposition: A | Discharge status: Home / Self Care | Payer: Medicaid Other | Attending: Physician Assistant | Admitting: Physician Assistant

## 2021-12-24 ENCOUNTER — Other Ambulatory Visit: Payer: Self-pay

## 2021-12-24 ENCOUNTER — Encounter: Payer: Self-pay | Admitting: Emergency Medicine

## 2021-12-24 DIAGNOSIS — R220 Localized swelling, mass and lump, head: Secondary | ICD-10-CM

## 2021-12-24 DIAGNOSIS — K047 Periapical abscess without sinus: Secondary | ICD-10-CM

## 2021-12-24 MED ORDER — AMOXICILLIN 500 MG PO CAPS: 500.0000 mg | ORAL_CAPSULE | Freq: Three times a day (TID) | ORAL | 0 refills | Status: DC

## 2021-12-24 MED ORDER — KETOROLAC TROMETHAMINE 30 MG/ML IJ SOLN
30.0000 mg | Freq: Once | INTRAMUSCULAR | Status: AC
  Administered 2021-12-24: 30 mg via INTRAMUSCULAR

## 2021-12-24 NOTE — ED Provider Notes (Signed)
?[facility] ? ? ? ?CSN: [id number] ?Arrival date & time: 12/24/21  6761 ? ? ?  ? ?History   ?Chief Complaint ?Chief Complaint  ?Patient presents with  ? Facial Swelling  ? ? ?HPI ?Lori Mcknight is a 36 y.o. female.  ? ?Patient here today for evaluation of left upper dental abscess that started recently with significant swelling that started yesterday and is worsened into today.  She is now having trouble opening her jaw fully due to swelling.  She has not had fever.  She did take naproxen and pain medication at home with minimal improvement. ? ?The history is provided by the patient.  ? ?Past Medical History:  ?Diagnosis Date  ? Acute diverticulitis 08/07/2019  ? Anemia   ? history of anemia  ? Anxiety   ? Asthma   ? inhaler as needed  ? Chest pain of uncertain etiology   ? intermittent left side chest pain  ? Chlamydia   ? Depression   ? Diverticulitis 2019  ? Diverticulitis of intestine with abscess 07/13/2021  ? Endometriosis   ? Genital herpes   ? Headache   ? MIGRAINES  ? Hypertension   ? no meds since April  ? Hypothyroidism   ? Obese   ? Obesity, Class III, BMI 40-49.9 (morbid obesity) (HCC) 11/06/2011  ? PCOS (polycystic ovarian syndrome)   ? PONV (postoperative nausea and vomiting)   ? Shortness of breath   ? on exertion from weight  ? ? ?Patient Active Problem List  ? Diagnosis Date Noted  ? Hypomagnesemia 09/18/2021  ? Fecal peritonitis (HCC) 09/18/2021  ? Intra-abdominal abscess s/p robotic drainage 09/18/2021 09/18/2021  ? Colostomy in place [facility]) 09/18/2021  ? Generalized abdominal pain 05/09/2021  ? 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 05/09/2021  ? Hypokalemia 05/09/2021  ? Asthma, mild intermittent 05/09/2021  ? Obesity, morbid, BMI 50 or higher (HCC) 08/08/2019  ? Endometriosis s/p ablation 08/08/2019  ? Dysuria 08/08/2019  ? Family history of colon cancer in father 08/08/2019  ? Incomplete bladder emptying 02/18/2018  ? DUB (dysfunctional uterine bleeding)  02/18/2018  ? Anemia 02/18/2018  ? Hypothyroid 01/29/2017  ? Migraines 08/26/2016  ? Chest pain 02/04/2016  ? Abnormal uterine bleeding (AUB) 01/09/2014  ? Thickened endometrium 12/02/2013  ? Essential hypertension 10/21/2013  ? Infertility, female 11/06/2011  ? Pelvic pain in female 08/01/2011  ? ? ?Past Surgical History:  ?Procedure Laterality Date  ? DILATION AND CURETTAGE OF UTERUS N/A 04/29/2018  ? Procedure: DILATATION AND CURETTAGE;  Surgeon: Allie Bossier, MD;  Location: [facility];  Service: Gynecology;  Laterality: N/A;  ? HYSTEROSCOPY WITH D & C N/A 04/06/2014  ? Procedure: DILATATION AND CURETTAGE /HYSTEROSCOPY ;  Surgeon: Tereso Newcomer, MD;  Location: [facility];  Service: Gynecology;  Laterality: N/A;  ? LAPAROSCOPY N/A 04/29/2018  ? Procedure: LAPAROSCOPY DIAGNOSTIC WITH PERITONEAL BIOPSY;  Surgeon: Allie Bossier, MD;  Location: [facility];  Service: Gynecology;  Laterality: N/A;  ? LEFT HEART CATH AND CORONARY ANGIOGRAPHY N/A 12/12/2016  ? Procedure: Left Heart Cath and Coronary Angiography;  Surgeon: Peter M Swaziland, MD;  Location: [facility] INVASIVE CV LAB;  Service: Cardiovascular;  Laterality: N/A;  ? WISDOM TOOTH EXTRACTION    ? ? ?OB History   ? ? Gravida  ?0  ? Para  ?0  ? Term  ?0  ? Preterm  ?0  ? AB  ?0  ? Living  ?0  ?  ? ? SAB  ?  0  ? IAB  ?0  ? Ectopic  ?0  ? Multiple  ?0  ? Live Births  ?0  ?   ?  ?  ? ? ? ?Home Medications   ? ?Prior to Admission medications   ?Medication Sig Start Date End Date Taking? Authorizing Provider  ?amoxicillin (AMOXIL) 500 MG capsule Take 1 capsule (500 mg total) by mouth 3 (three) times daily. 12/24/21  Yes Tomi Bamberger, PA-C  ?albuterol (VENTOLIN HFA) 108 (90 Base) MCG/ACT inhaler Inhale 2 puffs into the lungs every 6 (six) hours as needed for wheezing or shortness of breath. 05/22/21   Claiborne Rigg, NP  ?cyclobenzaprine (FLEXERIL) 10 MG tablet Take 1 tablet (10 mg total) by mouth at bedtime. ?Patient not taking: Reported on 07/14/2021 05/06/21   Claiborne Rigg, NP   ?gabapentin (NEURONTIN) 300 MG capsule Take 1 capsule (300 mg total) by mouth 3 (three) times daily. Increase to 4x/day as needed 09/23/21   Karie Soda, MD  ?levothyroxine (SYNTHROID) 88 MCG tablet Take 1 tablet (88 mcg total) by mouth daily before breakfast. 09/24/21   Rodolph Bong, MD  ?naproxen sodium (ALEVE) 220 MG tablet Take 440 mg by mouth 2 (two) times daily as needed (pain/headache).    [provider]  ?pantoprazole (PROTONIX) 40 MG tablet Take 1 tablet (40 mg total) by mouth daily. 09/25/21   Rodolph Bong, MD  ?polyethylene glycol (MIRALAX / GLYCOLAX) 17 g packet Take 17 g by mouth 2 (two) times daily as needed for moderate constipation or severe constipation. ?Patient not taking: Reported on 09/11/2021 07/17/21   Eric Form, PA-C  ?prochlorperazine (COMPAZINE) 10 MG tablet Take 1 tablet (10 mg total) by mouth every 6 (six) hours as needed for refractory nausea / vomiting (Use for nausea and / or vomiting unresolved with ondansetron (Zofran)). 09/24/21   Rodolph Bong, MD  ?traMADol Janean Sark) 50 MG tablet Take 1-2 tablets (50-100 mg total) by mouth every 6 (six) hours as needed for moderate pain or severe pain. 09/23/21   Karie Soda, MD  ? ? ?Family History ?Family History  ?Problem Relation Age of Onset  ? Heart disease Mother   ? Sleep apnea Mother   ? Depression Mother   ? Hypertension Mother   ? Kidney disease Mother   ? Cancer Father   ? Diabetes Father   ? Heart disease Father   ? Colon cancer Father   ?     55s  ? Hypertension Father   ? Diabetes Sister   ? Hypertension Sister   ? Heart disease Maternal Grandmother   ? Diabetes Maternal Grandfather   ? Heart disease Maternal Grandfather   ? Colon cancer Cousin   ?     died age 80, paternal cousin  ? ? ?Social History ?Social History  ? ?Tobacco Use  ? Smoking status: Some Days  ?  Packs/day: 0.25  ?  Types: Cigarettes  ? Smokeless tobacco: Never  ? Tobacco comments:  ?  2-3 cigerettes a week.  ?Vaping Use  ?  Vaping Use: Never used  ?Substance Use Topics  ? Alcohol use: Not Currently  ?  Alcohol/week: 0.0 standard drinks  ?  Comment: occasonal   ? Drug use: Not Currently  ?  Types: Marijuana  ?  Comment: last use 3 months ago  ? ? ? ?Allergies   ?Dilaudid [hydromorphone] ? ? ?Review of Systems ?Review of Systems  ?Constitutional:  Negative for chills and fever.  ?  HENT:  Positive for dental problem and facial swelling.   ?Eyes:  Negative for discharge and redness.  ?Gastrointestinal:  Negative for abdominal pain, nausea and vomiting.  ? ? ?Physical Exam ?Triage Vital Signs ?ED Triage Vitals  ?Enc Vitals Group  ?   BP   ?   Pulse   ?   Resp   ?   Temp   ?   Temp src   ?   SpO2   ?   Weight   ?   Height   ?   Head Circumference   ?   Peak Flow   ?   Pain Score   ?   Pain Loc   ?   Pain Edu?   ?   Excl. in GC?   ? ?No data found. ? ?Updated Vital Signs ?BP 139/88 (BP Location: Right Arm)   Pulse 80   Temp 98.1 ?F (36.7 ?C) (Oral)   Resp 16   SpO2 95%  ?   ? ?Physical Exam ?Vitals and nursing note reviewed.  ?Constitutional:   ?   General: She is not in acute distress. ?   Appearance: Normal appearance. She is not ill-appearing.  ?HENT:  ?   Head: Normocephalic and atraumatic.  ?   Mouth/Throat:  ?   Comments: Upper back left molar with significant decay and significant gingival swelling as well as left-sided maxillary swelling noted on exam ?Eyes:  ?   Conjunctiva/sclera: Conjunctivae normal.  ?Cardiovascular:  ?   Rate and Rhythm: Normal rate.  ?Pulmonary:  ?   Effort: Pulmonary effort is normal.  ?Neurological:  ?   Mental Status: She is alert.  ?Psychiatric:     ?   Mood and Affect: Mood normal.     ?   Behavior: Behavior normal.     ?   Thought Content: Thought content normal.  ? ? ? ?UC Treatments / Results  ?Labs ?(all labs ordered are listed, but only abnormal results are displayed) ?Labs Reviewed - No data to display ? ?EKG ? ? ?Radiology ?No results found. ? ?Procedures ?Procedures (including critical care  time) ? ?Medications Ordered in UC ?Medications  ?ketorolac (TORADOL) 30 MG/ML injection 30 mg (30 mg Intramuscular Given 12/24/21 0846)  ? ? ?Initial Impression / Assessment and Plan / UC Course  ?I have reviewed the tri

## 2021-12-24 NOTE — ED Triage Notes (Addendum)
Left cheek/jaw swelling starting last night worsening into today to where she can't open her jaw fully. Visible swelling present in triage to face. Took naproxen and vicoden she had at home around an hour ago with minimal pain improvement. Heat helps pain ?

## 2021-12-25 ENCOUNTER — Emergency Department (HOSPITAL_BASED_OUTPATIENT_CLINIC_OR_DEPARTMENT_OTHER)
Admission: EM | Admit: 2021-12-25 | Discharge: 2021-12-25 | Disposition: A | Discharge status: Home / Self Care | Payer: Medicaid Other | Attending: Emergency Medicine | Admitting: Emergency Medicine

## 2021-12-25 ENCOUNTER — Other Ambulatory Visit: Payer: Self-pay

## 2021-12-25 ENCOUNTER — Encounter (HOSPITAL_BASED_OUTPATIENT_CLINIC_OR_DEPARTMENT_OTHER): Payer: Self-pay | Admitting: Emergency Medicine

## 2021-12-25 DIAGNOSIS — R030 Elevated blood-pressure reading, without diagnosis of hypertension: Secondary | ICD-10-CM

## 2021-12-25 DIAGNOSIS — K047 Periapical abscess without sinus: Secondary | ICD-10-CM | POA: Insufficient documentation

## 2021-12-25 MED ORDER — LIDOCAINE-EPINEPHRINE (PF) 2 %-1:200000 IJ SOLN
20.0000 mL | Freq: Once | INTRAMUSCULAR | Status: AC
  Administered 2021-12-25: 20 mL
  Filled 2021-12-25: qty 20

## 2021-12-25 MED ORDER — IBUPROFEN 400 MG PO TABS
600.0000 mg | ORAL_TABLET | Freq: Once | ORAL | Status: AC
  Administered 2021-12-25: 600 mg via ORAL
  Filled 2021-12-25: qty 1

## 2021-12-25 MED ORDER — ACETAMINOPHEN 500 MG PO TABS
1000.0000 mg | ORAL_TABLET | Freq: Once | ORAL | Status: AC
  Administered 2021-12-25: 1000 mg via ORAL
  Filled 2021-12-25: qty 2

## 2021-12-25 MED ORDER — AMOXICILLIN 500 MG PO CAPS
1000.0000 mg | ORAL_CAPSULE | Freq: Once | ORAL | Status: AC
  Administered 2021-12-25: 1000 mg via ORAL
  Filled 2021-12-25: qty 2

## 2021-12-25 NOTE — ED Provider Notes (Signed)
?[facility] EMERGENCY DEPT ?Provider Note ? ? ?CSN: [id number] ?Arrival date & time: 12/25/21  [id number] ? ?  ? ?History ? ?Chief Complaint  ?Patient presents with  ? Facial Swelling  ? ? ?[place] C Spanbauer is a 36 y.o. female. ? ?Patient with dental abscess dx at urgent care yesterday, c/o increased pain/swelling to area. Intermittent dental pain in area for past few weeks, but pain and swelling past few days. Symptoms acute onset, moderate, persistent. Is located near left upper molar/tooth decayed. Denies sore throat. No trouble swallowing. Voice normal. No swelling to neck. No sob or trouble breathing. No fever or chills. Started on amoxicillin today.  ? ?The history is provided by the patient and medical records.  ? ?  ? ?Home Medications ?Prior to Admission medications   ?Medication Sig Start Date End Date Taking? Authorizing Provider  ?albuterol (VENTOLIN HFA) 108 (90 Base) MCG/ACT inhaler Inhale 2 puffs into the lungs every 6 (six) hours as needed for wheezing or shortness of breath. 05/22/21   Claiborne RiggFleming, Zelda W, NP  ?amoxicillin (AMOXIL) 500 MG capsule Take 1 capsule (500 mg total) by mouth 3 (three) times daily. 12/24/21   Tomi BambergerMyers, Rebecca F, PA-C  ?cyclobenzaprine (FLEXERIL) 10 MG tablet Take 1 tablet (10 mg total) by mouth at bedtime. ?Patient not taking: Reported on 07/14/2021 05/06/21   Claiborne RiggFleming, Zelda W, NP  ?gabapentin (NEURONTIN) 300 MG capsule Take 1 capsule (300 mg total) by mouth 3 (three) times daily. Increase to 4x/day as needed 09/23/21   Karie SodaGross, Steven, MD  ?levothyroxine (SYNTHROID) 88 MCG tablet Take 1 tablet (88 mcg total) by mouth daily before breakfast. 09/24/21   Rodolph Bonghompson, Daniel V, MD  ?naproxen sodium (ALEVE) 220 MG tablet Take 440 mg by mouth 2 (two) times daily as needed (pain/headache).    [provider]  ?pantoprazole (PROTONIX) 40 MG tablet Take 1 tablet (40 mg total) by mouth daily. 09/25/21   Rodolph Bonghompson, Daniel V, MD  ?polyethylene glycol (MIRALAX / GLYCOLAX) 17 g packet Take  17 g by mouth 2 (two) times daily as needed for moderate constipation or severe constipation. ?Patient not taking: Reported on 09/11/2021 07/17/21   Eric FormKabrich, Martha H, PA-C  ?prochlorperazine (COMPAZINE) 10 MG tablet Take 1 tablet (10 mg total) by mouth every 6 (six) hours as needed for refractory nausea / vomiting (Use for nausea and / or vomiting unresolved with ondansetron (Zofran)). 09/24/21   Rodolph Bonghompson, Daniel V, MD  ?traMADol Janean Sark(ULTRAM) 50 MG tablet Take 1-2 tablets (50-100 mg total) by mouth every 6 (six) hours as needed for moderate pain or severe pain. 09/23/21   Karie SodaGross, Steven, MD  ?   ? ?Allergies    ?Dilaudid [hydromorphone]   ? ?Review of Systems   ?Review of Systems  ?Constitutional:  Negative for chills and fever.  ?HENT:  Negative for sore throat and trouble swallowing.   ?Respiratory:  Negative for shortness of breath.   ?Cardiovascular:  Negative for chest pain and leg swelling.  ?Gastrointestinal:  Negative for nausea and vomiting.  ?Musculoskeletal:  Negative for neck pain and neck stiffness.  ?Skin:  Negative for rash.  ?Neurological:  Negative for headaches.  ? ?Physical Exam ?Updated Vital Signs ?BP (!) 174/115 (BP Location: Right Wrist)   Pulse 98   Temp 99.1 ?F (37.3 ?C) (Oral)   Resp 16   Ht 1.676 m (5\' 6" )   Wt (!) 174.2 kg   LMP 09/18/2021   SpO2 97%   BMI 61.98 kg/m?  ?Physical Exam ?Vitals and  nursing note reviewed.  ?Constitutional:   ?   Appearance: Normal appearance. She is well-developed.  ?HENT:  ?   Head: Atraumatic.  ?   Nose: Nose normal.  ?   Mouth/Throat:  ?   Mouth: Mucous membranes are moist.  ?   Pharynx: Oropharynx is clear.  ?   Comments: Pharynx normal. Left upper dental decay and associated gum swelling/dental abscess. No trismus. No swelling, pain or tenderness to floor of mouth or neck.  ?Eyes:  ?   General: No scleral icterus. ?   Conjunctiva/sclera: Conjunctivae normal.  ?Neck:  ?   Trachea: No tracheal deviation.  ?   Comments: Trachea midline, no swelling, mass  or  tenderness.  ?Cardiovascular:  ?   Rate and Rhythm: Normal rate.  ?   Pulses: Normal pulses.  ?Pulmonary:  ?   Effort: Pulmonary effort is normal. No respiratory distress.  ?   Breath sounds: Normal breath sounds. No stridor.  ?Abdominal:  ?   General: There is no distension.  ?Genitourinary: ?   Comments: No cva tenderness.  ?Musculoskeletal:     ?   General: No swelling.  ?   Cervical back: Normal range of motion and neck supple. No rigidity. No muscular tenderness.  ?Lymphadenopathy:  ?   Cervical: No cervical adenopathy.  ?Skin: ?   General: Skin is warm and dry.  ?   Findings: No rash.  ?Neurological:  ?   Mental Status: She is alert.  ?   Comments: Alert, speech normal.   ?Psychiatric:     ?   Mood and Affect: Mood normal.  ? ? ?ED Results / Procedures / Treatments   ?Labs ?(all labs ordered are listed, but only abnormal results are displayed) ?Labs Reviewed - No data to display ? ?EKG ?None ? ?Radiology ?No results found. ? ?Procedures ?Marland Kitchen.Incision and Drainage ? ?Date/Time: 12/25/2021 8:22 PM ?Performed by: Cathren Laine, MD ?Authorized by: Cathren Laine, MD  ? ?Consent:  ?  Consent given by:  Patient ?Location:  ?  Type:  Abscess ?  Location:  Mouth ?  Mouth location: dental abscess. ?Anesthesia:  ?  Anesthesia method:  Local infiltration ?  Local anesthetic:  Lidocaine 2% WITH epi ?Procedure type:  ?  Complexity:  Complex ?Procedure details:  ?  Incision types:  Single straight ?  Incision depth:  Submucosal ?  Wound management:  Probed and deloculated and irrigated with saline ?  Drainage:  Purulent ?  Drainage amount:  Moderate ?  Wound treatment:  Wound left open ?  Packing materials:  None ?Post-procedure details:  ?  Procedure completion:  Tolerated well, no immediate complications ?Comments:  ?   Rinse and spit with saline/water.   ? ? ?Medications Ordered in ED ?Medications  ?lidocaine-EPINEPHrine (XYLOCAINE W/EPI) 2 %-1:200000 (PF) injection 20 mL (has no administration in time range)  ? ? ?ED  Course/ Medical Decision Making/ A&P ?  ?                        ?Medical Decision Making ?Problems Addressed: ?Dental abscess: acute illness or injury ?Elevated blood pressure reading: acute illness or injury ? ?Amount and/or Complexity of Data Reviewed ?External Data Reviewed: notes. ? ?Risk ?OTC drugs. ?Prescription drug management. ? ? ?I and D abscess - large amount of pus drained.  ? ?Reviewed nursing notes and prior charts for additional history. External reports reviewed.  ? ?Amoxicillin po. Acetaminophen po. Ibuprofen po. ? ?Recheck  pt, feels improved.  ? ?No bleeding.  ? ?Rec close dental f/u. ? ?Return precautions provided. ? ?Also rec close pcp f/u re elevated bp - pt indicates hx borderline high bp, but not on meds prior.  ? ? ? ? ? ? ? ? ? ? ? ?Final Clinical Impression(s) / ED Diagnoses ?Final diagnoses:  ?None  ? ? ?Rx / DC Orders ?ED Discharge Orders   ? ? None  ? ?  ? ? ?  ?Cathren Laine, MD ?12/25/21 2103 ? ?

## 2021-12-25 NOTE — Discharge Instructions (Addendum)
It was our pleasure to provide your ER care today - we hope that you feel better. ? ?Take antibiotic as prescribed. Take acetaminophen or ibuprofen as need. Do warm salt water gargles and spit.  ? ?Follow up with dentist in the next 1-2 days - call office tomorrow AM to arrange appointment.  ? ?Your blood pressure is high today - follow up with primary care doctor in the next few days for recheck. ? ?Return to ER if worse, new symptoms, unable to swallow, trouble breathing, or other concern.  ?

## 2021-12-25 NOTE — ED Triage Notes (Signed)
Patient arrives ambulatory POV with swelling to left side of face. Patient states she was seen at [facility] yesterday and given antibiotics however swelling and pain has worsened.  ?

## 2021-12-30 ENCOUNTER — Ambulatory Visit: Payer: Self-pay | Admitting: Surgery

## 2021-12-30 DIAGNOSIS — R739 Hyperglycemia, unspecified: Secondary | ICD-10-CM

## 2021-12-30 DIAGNOSIS — K5909 Other constipation: Secondary | ICD-10-CM | POA: Diagnosis present

## 2021-12-30 DIAGNOSIS — K644 Residual hemorrhoidal skin tags: Secondary | ICD-10-CM | POA: Insufficient documentation

## 2021-12-30 DIAGNOSIS — Z8719 Personal history of other diseases of the digestive system: Secondary | ICD-10-CM

## 2022-01-14 ENCOUNTER — Ambulatory Visit: Payer: Medicaid Other | Admitting: Gastroenterology

## 2022-01-26 ENCOUNTER — Encounter (HOSPITAL_BASED_OUTPATIENT_CLINIC_OR_DEPARTMENT_OTHER): Payer: Self-pay

## 2022-01-26 ENCOUNTER — Other Ambulatory Visit: Payer: Self-pay

## 2022-01-26 DIAGNOSIS — J45909 Unspecified asthma, uncomplicated: Secondary | ICD-10-CM | POA: Insufficient documentation

## 2022-01-26 DIAGNOSIS — I10 Essential (primary) hypertension: Secondary | ICD-10-CM | POA: Insufficient documentation

## 2022-01-26 DIAGNOSIS — E039 Hypothyroidism, unspecified: Secondary | ICD-10-CM | POA: Insufficient documentation

## 2022-01-26 DIAGNOSIS — Z79899 Other long term (current) drug therapy: Secondary | ICD-10-CM | POA: Insufficient documentation

## 2022-01-26 DIAGNOSIS — K435 Parastomal hernia without obstruction or  gangrene: Secondary | ICD-10-CM | POA: Insufficient documentation

## 2022-01-26 LAB — URINALYSIS, ROUTINE W REFLEX MICROSCOPIC
Bilirubin Urine: NEGATIVE
Glucose, UA: NEGATIVE mg/dL
Hgb urine dipstick: NEGATIVE
Ketones, ur: NEGATIVE mg/dL
Leukocytes,Ua: NEGATIVE
Nitrite: NEGATIVE
Specific Gravity, Urine: 1.021 (ref 1.005–1.030)
pH: 7 (ref 5.0–8.0)

## 2022-01-26 NOTE — ED Triage Notes (Signed)
Pt c/o abdominal pain at ostomy site.  Left lower quadrant tight and burning.  Denies n/v ? ?

## 2022-01-27 ENCOUNTER — Emergency Department (HOSPITAL_BASED_OUTPATIENT_CLINIC_OR_DEPARTMENT_OTHER)
Admission: EM | Admit: 2022-01-27 | Discharge: 2022-01-27 | Disposition: A | Discharge status: Home / Self Care | Payer: Medicaid Other | Attending: Emergency Medicine | Admitting: Emergency Medicine

## 2022-01-27 ENCOUNTER — Emergency Department (HOSPITAL_BASED_OUTPATIENT_CLINIC_OR_DEPARTMENT_OTHER): Payer: Medicaid Other

## 2022-01-27 DIAGNOSIS — K435 Parastomal hernia without obstruction or  gangrene: Secondary | ICD-10-CM

## 2022-01-27 LAB — CBC WITH DIFFERENTIAL/PLATELET
Abs Immature Granulocytes: 0.03 10*3/uL (ref 0.00–0.07)
Basophils Absolute: 0.1 10*3/uL (ref 0.0–0.1)
Basophils Relative: 1 %
Eosinophils Absolute: 0.1 10*3/uL (ref 0.0–0.5)
Eosinophils Relative: 2 %
HCT: 45.1 % (ref 36.0–46.0)
Hemoglobin: 14.5 g/dL (ref 12.0–15.0)
Immature Granulocytes: 0 %
Lymphocytes Relative: 47 %
Lymphs Abs: 3.5 10*3/uL (ref 0.7–4.0)
MCH: 29.7 pg (ref 26.0–34.0)
MCHC: 32.2 g/dL (ref 30.0–36.0)
MCV: 92.2 fL (ref 80.0–100.0)
Monocytes Absolute: 0.5 10*3/uL (ref 0.1–1.0)
Monocytes Relative: 7 %
Neutro Abs: 3.2 10*3/uL (ref 1.7–7.7)
Neutrophils Relative %: 43 %
Platelets: 183 10*3/uL (ref 150–400)
RBC: 4.89 MIL/uL (ref 3.87–5.11)
RDW: 14.4 % (ref 11.5–15.5)
WBC: 7.4 10*3/uL (ref 4.0–10.5)
nRBC: 0 % (ref 0.0–0.2)

## 2022-01-27 LAB — COMPREHENSIVE METABOLIC PANEL
ALT: 19 U/L (ref 0–44)
AST: 23 U/L (ref 15–41)
Albumin: 4.1 g/dL (ref 3.5–5.0)
Alkaline Phosphatase: 68 U/L (ref 38–126)
Anion gap: 7 (ref 5–15)
BUN: 7 mg/dL (ref 6–20)
CO2: 27 mmol/L (ref 22–32)
Calcium: 9.2 mg/dL (ref 8.9–10.3)
Chloride: 105 mmol/L (ref 98–111)
Creatinine, Ser: 0.79 mg/dL (ref 0.44–1.00)
GFR, Estimated: >60 mL/min (ref >60)
Glucose, Bld: 79 mg/dL (ref 70–99)
Potassium: 3.2 mmol/L — ABNORMAL LOW (ref 3.5–5.1)
Sodium: 139 mmol/L (ref 135–145)
Total Bilirubin: 0.5 mg/dL (ref 0.3–1.2)
Total Protein: 7.9 g/dL (ref 6.5–8.1)

## 2022-01-27 LAB — PREGNANCY, URINE: Preg Test, Ur: NEGATIVE

## 2022-01-27 LAB — LIPASE, BLOOD: Lipase: 39 U/L (ref 11–51)

## 2022-01-27 MED ORDER — IOHEXOL 300 MG/ML  SOLN
100.0000 mL | Freq: Once | INTRAMUSCULAR | Status: AC | PRN
  Administered 2022-01-27: 100 mL via INTRAVENOUS

## 2022-01-27 NOTE — ED Notes (Signed)
Lab called and agreed to add on the urine pregnancy.  ?

## 2022-01-27 NOTE — ED Provider Notes (Signed)
?[facility] EMERGENCY DEPT ?Provider Note ? ? ?CSN: [id number] ?Arrival date & time: 01/26/22  2112 ? ?  ? ?History ? ?Chief Complaint  ?Patient presents with  ? Abdominal Pain  ? ? ?Lori Mcknight is a 36 y.o. female. ? ?HPI ? ?  ? ?This is a 36 year old female with extensive history of diverticulitis requiring colostomy who presents with abdominal pain.  Patient reports 2-day history of worsening pain that is sharp and burning around her colostomy site.  She recently had some issues with constipation and started taking MiraLAX.  She states that now her stools are quite soft.  She has noted increasing pain and burning at the ostomy site.  Has noted some blood in her stool.  She has not taken anything for her pain at home.  She is due to have colostomy takedown in May.  She has had not had any nausea or vomiting.  No fevers. ? ?Home Medications ?Prior to Admission medications   ?Medication Sig Start Date End Date Taking? Authorizing Provider  ?albuterol (VENTOLIN HFA) 108 (90 Base) MCG/ACT inhaler Inhale 2 puffs into the lungs every 6 (six) hours as needed for wheezing or shortness of breath. 05/22/21   [name], NP  ?amoxicillin (AMOXIL) 500 MG capsule Take 1 capsule (500 mg total) by mouth 3 (three) times daily. 12/24/21   Francene Finders, PA-C  ?cyclobenzaprine (FLEXERIL) 10 MG tablet Take 1 tablet (10 mg total) by mouth at bedtime. ?Patient not taking: Reported on 07/14/2021 05/06/21   [name], NP  ?gabapentin (NEURONTIN) 300 MG capsule Take 1 capsule (300 mg total) by mouth 3 (three) times daily. Increase to 4x/day as needed 09/23/21   [name], MD  ?levothyroxine (SYNTHROID) 88 MCG tablet Take 1 tablet (88 mcg total) by mouth daily before breakfast. 09/24/21   [name], MD  ?naproxen sodium (ALEVE) 220 MG tablet Take 440 mg by mouth 2 (two) times daily as needed (pain/headache).    [provider]  ?pantoprazole (PROTONIX) 40 MG tablet Take 1 tablet (40 mg  total) by mouth daily. 09/25/21   [name], MD  ?polyethylene glycol (MIRALAX / GLYCOLAX) 17 g packet Take 17 g by mouth 2 (two) times daily as needed for moderate constipation or severe constipation. ?Patient not taking: Reported on 09/11/2021 07/17/21   Winferd Humphrey, PA-C  ?prochlorperazine (COMPAZINE) 10 MG tablet Take 1 tablet (10 mg total) by mouth every 6 (six) hours as needed for refractory nausea / vomiting (Use for nausea and / or vomiting unresolved with ondansetron (Zofran)). 09/24/21   [name], MD  ?traMADol Veatrice Bourbon) 50 MG tablet Take 1-2 tablets (50-100 mg total) by mouth every 6 (six) hours as needed for moderate pain or severe pain. 09/23/21   [name], MD  ?   ? ?Allergies    ?Dilaudid [hydromorphone]   ? ?Review of Systems   ?Review of Systems  ?Constitutional:  Negative for fever.  ?Respiratory:  Negative for shortness of breath.   ?Cardiovascular:  Negative for chest pain.  ?Gastrointestinal:  Positive for abdominal pain, blood in stool, constipation and diarrhea. Negative for nausea and vomiting.  ?All other systems reviewed and are negative. ? ?Physical Exam ?Updated Vital Signs ?BP 129/80   Pulse 85   Temp 98.2 ?F (36.8 ?C) (Oral)   Resp 18   Ht 1.676 m (5\' 6" )   Wt (!) 165.6 kg   LMP 01/20/2022 (Exact Date)   SpO2 99%  BMI 58.91 kg/m?  ?Physical Exam ?Vitals and nursing note reviewed.  ?Constitutional:   ?   Appearance: She is well-developed. She is obese. She is not ill-appearing.  ?HENT:  ?   Head: Normocephalic and atraumatic.  ?   Mouth/Throat:  ?   Mouth: Mucous membranes are moist.  ?Eyes:  ?   Pupils: Pupils are equal, round, and reactive to light.  ?Cardiovascular:  ?   Rate and Rhythm: Normal rate and regular rhythm.  ?   Heart sounds: Normal heart sounds.  ?Pulmonary:  ?   Effort: Pulmonary effort is normal. No respiratory distress.  ?   Breath sounds: No wheezing.  ?Abdominal:  ?   General: Bowel sounds are normal.  ?   Palpations: Abdomen is  soft.  ?   Tenderness: There is abdominal tenderness in the left upper quadrant.  ?   Comments: Clear stool without blood noted in the ostomy bag, tenderness to palpation around the ostomy site, no overlying skin changes or erythema, no significant skin breakdown  ?Musculoskeletal:  ?   Cervical back: Neck supple.  ?Skin: ?   General: Skin is warm and dry.  ?Neurological:  ?   Mental Status: She is alert and oriented to person, place, and time.  ?Psychiatric:     ?   Mood and Affect: Mood normal.  ? ? ?ED Results / Procedures / Treatments   ?Labs ?(all labs ordered are listed, but only abnormal results are displayed) ?Labs Reviewed  ?URINALYSIS, ROUTINE W REFLEX MICROSCOPIC - Abnormal; Notable for the following components:  ?    Result Value  ? Protein, ur TRACE (*)   ? All other components within normal limits  ?COMPREHENSIVE METABOLIC PANEL - Abnormal; Notable for the following components:  ? Potassium 3.2 (*)   ? All other components within normal limits  ?CBC WITH DIFFERENTIAL/PLATELET  ?LIPASE, BLOOD  ?PREGNANCY, URINE  ? ? ?EKG ?None ? ?Radiology ?CT ABDOMEN PELVIS W CONTRAST ? ?Result Date: 01/27/2022 ?CLINICAL DATA:  Left upper quadrant abdominal pain. EXAM: CT ABDOMEN AND PELVIS WITH CONTRAST TECHNIQUE: Multidetector CT imaging of the abdomen and pelvis was performed using the standard protocol following bolus administration of intravenous contrast. RADIATION DOSE REDUCTION: This exam was performed according to the departmental dose-optimization program which includes automated exposure control, adjustment of the mA and/or kV according to patient size and/or use of iterative reconstruction technique. CONTRAST:  177mL OMNIPAQUE IOHEXOL 300 MG/ML  SOLN COMPARISON:  September 14, 2021 FINDINGS: Lower chest: No acute abnormality. Hepatobiliary: No focal liver abnormality is seen. No gallstones, gallbladder wall thickening, or biliary dilatation. Pancreas: Unremarkable. No pancreatic ductal dilatation or  surrounding inflammatory changes. Spleen: Normal in size without focal abnormality. Adrenals/Urinary Tract: A 2.3 cm x 1.4 cm low-attenuation right adrenal mass is seen (approximately -6.95 Hounsfield units). The left adrenal gland is unremarkable. Kidneys are normal, without renal calculi, focal lesion, or hydronephrosis. Bladder is unremarkable. Stomach/Bowel: Stomach is within normal limits. Appendix appears normal. Surgically anastomosed bowel is seen within the region of the mid to distal sigmoid colon. No evidence of bowel wall thickening, distention, or inflammatory changes. Vascular/Lymphatic: No significant vascular findings are present. No enlarged abdominal or pelvic lymph nodes. Reproductive: Uterus and bilateral adnexa are unremarkable. 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 Musculoskeletal: Degenerative changes are seen within the lower lumbar spine at the level of L5-S1. IMPRESSION: 1. 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 2. Small, low-attenuation right adrenal adenoma. No additional follow-up imaging is recommended. Electronically Signed   By: Virgina Norfolk M.D.   On: 01/27/2022 03:06   ? ?Procedures ?Procedures  ? ? ?Medications Ordered in ED ?Medications  ?iohexol (OMNIPAQUE) 300 MG/ML solution 100 mL (100 mLs Intravenous Contrast Given 01/27/22 0242)  ? ? ?ED Course/ Medical Decision Making/ A&P ?Clinical Course as of 01/27/22 0337  ?Mon Jan 27, 2022  ?[id number] Spoke with Dr. Grandville Silos.  Reviewed the imaging with him.  States that she can follow-up with Dr. Johney Maine as an outpatient given otherwise reassuring evaluation. [CH]  ?  ?Clinical Course User Index ?[CH] Clarissa Laird, Barbette Hair, MD  ? ?                        ?Medical Decision Making ?Amount and/or Complexity of Data Reviewed ?Labs: ordered. ?Radiology: ordered. ? ?Risk ?Prescription drug  management. ? ? ?This patient presents to the ED for concern of abdominal pain, this involves an extensive number of treatment options, and is a complaint that carries with it a high risk of complications and morbidity.  The differential

## 2022-01-27 NOTE — Discharge Instructions (Signed)
You were seen today for pain around her stoma site.  CT scan was reviewed.  He has some herniation of fat and fluid into your stoma site.  This is quite common.  Follow-up closely with your general surgeon. ?

## 2022-01-27 NOTE — ED Notes (Signed)
Pt verbalizes understanding of discharge instructions. Opportunity for questioning and answers were provided. Pt discharged from ED to home.   ? ?

## 2022-02-03 ENCOUNTER — Ambulatory Visit (HOSPITAL_COMMUNITY)
Admit: 2022-02-03 | Discharge: 2022-02-03 | Disposition: A | Discharge status: Home / Self Care | Payer: Self-pay | Source: Other Acute Inpatient Hospital | Attending: Urgent Care | Admitting: Urgent Care

## 2022-02-03 ENCOUNTER — Ambulatory Visit
Admission: EM | Admit: 2022-02-03 | Discharge: 2022-02-03 | Disposition: A | Discharge status: Home / Self Care | Payer: Medicaid Other | Attending: Urgent Care | Admitting: Urgent Care

## 2022-02-03 ENCOUNTER — Ambulatory Visit (HOSPITAL_COMMUNITY)
Admission: RE | Admit: 2022-02-03 | Discharge: 2022-02-03 | Disposition: A | Discharge status: Home / Self Care | Payer: Self-pay | Source: Ambulatory Visit | Attending: Urgent Care | Admitting: Urgent Care

## 2022-02-03 DIAGNOSIS — M419 Scoliosis, unspecified: Secondary | ICD-10-CM | POA: Insufficient documentation

## 2022-02-03 DIAGNOSIS — M542 Cervicalgia: Secondary | ICD-10-CM

## 2022-02-03 DIAGNOSIS — M546 Pain in thoracic spine: Secondary | ICD-10-CM

## 2022-02-03 DIAGNOSIS — R0789 Other chest pain: Secondary | ICD-10-CM

## 2022-02-03 DIAGNOSIS — M549 Dorsalgia, unspecified: Secondary | ICD-10-CM | POA: Insufficient documentation

## 2022-02-03 DIAGNOSIS — M47814 Spondylosis without myelopathy or radiculopathy, thoracic region: Secondary | ICD-10-CM | POA: Insufficient documentation

## 2022-02-03 MED ORDER — NAPROXEN 500 MG PO TABS: 500.0000 mg | ORAL_TABLET | Freq: Two times a day (BID) | ORAL | 0 refills | Status: DC

## 2022-02-03 MED ORDER — TIZANIDINE HCL 4 MG PO TABS: 4.0000 mg | ORAL_TABLET | Freq: Every day | ORAL | 0 refills | Status: DC
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
## 2022-02-05 ENCOUNTER — Encounter: Payer: Self-pay | Admitting: Gastroenterology

## 2022-02-05 ENCOUNTER — Ambulatory Visit (INDEPENDENT_AMBULATORY_CARE_PROVIDER_SITE_OTHER): Payer: Self-pay | Admitting: Gastroenterology

## 2022-02-05 VITALS — BP 136/88 | HR 88 | Ht 66.0 in | Wt 365.8 lb

## 2022-02-05 DIAGNOSIS — Z800 Family history of malignant neoplasm of digestive organs: Secondary | ICD-10-CM

## 2022-02-05 DIAGNOSIS — K5792 Diverticulitis of intestine, part unspecified, without perforation or abscess without bleeding: Secondary | ICD-10-CM

## 2022-02-05 NOTE — Progress Notes (Addendum)
? ? ? ?02/05/2022 ?Myda C Novella ?[id number] ?05/27/1986 ? ? ?HISTORY OF PRESENT ILLNESS: This is a 36-year-old female who is new to our office.  She is been referred here by Dr. Gross to discuss a colonoscopy.  She tells me that she has had issues with recurrent diverticulitis since about 2019.  She ended up with abscesses, etc. in December 2022 requiring surgery.  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  She is scheduled for reversal and takedown of her ostomy on 02/27/2022.  Dr. Gross wanted her to have a colonoscopy prior to that.  She has family history of colon cancer in her father.  The patient herself has never had a colonoscopy. ? 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 ? ?Past Medical History:  ?Diagnosis Date  ? Acute diverticulitis 08/07/2019  ? Anemia   ? history of anemia  ? Anxiety   ? Asthma   ? inhaler as needed  ? Chest pain of uncertain etiology   ? intermittent left side chest pain  ? Chlamydia   ? Depression   ? Diverticulitis 2019  ? Diverticulitis of intestine with abscess 07/13/2021  ? Endometriosis   ? Genital herpes   ? Headache   ? MIGRAINES  ? Hypertension   ? no meds since April  ? Hypothyroidism   ? Obese   ? Obesity, Class III, BMI 40-49.9 (morbid obesity) (HCC) 11/06/2011  ? PCOS (polycystic ovarian syndrome)   ? PONV (postoperative nausea and vomiting)   ? Shortness of breath   ? on exertion from weight  ? ?Past Surgical History:  ?Procedure Laterality Date  ? DILATION AND CURETTAGE OF UTERUS N/A 04/29/2018  ? Procedure: DILATATION AND CURETTAGE;  Surgeon: Dove, Myra C, MD;  Location: [facility];  Service: Gynecology;  Laterality: N/A;  ? HYSTEROSCOPY WITH D & C N/A 04/06/2014  ? Procedure: DILATATION AND CURETTAGE /HYSTEROSCOPY ;  Surgeon: Ugonna A Anyanwu, MD;  Location: [facility];  Service: Gynecology;  Laterality: N/A;  ? LAPAROSCOPY N/A 04/29/2018  ?  Procedure: LAPAROSCOPY DIAGNOSTIC WITH PERITONEAL BIOPSY;  Surgeon: Dove, Myra C, MD;  Location: [facility];  Service: Gynecology;  Laterality: N/A;  ? LEFT HEART CATH AND CORONARY ANGIOGRAPHY N/A 12/12/2016  ? Procedure: Left Heart Cath and Coronary Angiography;  Surgeon: Peter M Jordan, MD;  Location: [facility];  Service: Cardiovascular;  Laterality: N/A;  ? WISDOM TOOTH EXTRACTION    ? ? reports that she has been smoking cigarettes. She has been smoking an average of .25 packs per day. She has never used smokeless tobacco. She reports that she does not currently use alcohol. She reports that she does not currently use drugs after having used the following drugs: Marijuana. ?family history includes Cancer in her father; Colon cancer in her cousin and father; Depression in her mother; Diabetes in her father, maternal grandfather, and sister; Esophageal cancer in her maternal grandmother; Heart disease in her father, maternal grandfather, maternal grandmother, and mother; Hypertension in her father, mother, and sister; Kidney disease in her mother; Sleep apnea in her mother. ?Allergies  ?Allergen Reactions  ? Dilaudid [Hydromorphone] Itching  ?  Tolerates morphine fine  ? ? ?  ?Outpatient Encounter Medications as of 02/05/2022  ?Medication Sig  ? albuterol (VENTOLIN HFA) 108 (90 Base) MCG/ACT inhaler Inhale 2 puffs into the lungs every 6 (six) hours as needed for   wheezing or shortness of breath.  ? gabapentin (NEURONTIN) 300 MG capsule Take 1 capsule (300 mg total) by mouth 3 (three) times daily. Increase to 4x/day as needed  ? naproxen (NAPROSYN) 500 MG tablet Take 1 tablet (500 mg total) by mouth 2 (two) times daily with a meal.  ? polyethylene glycol (MIRALAX / GLYCOLAX) 17 g packet Take 17 g by mouth 2 (two) times daily as needed for moderate constipation or severe constipation.  ? tiZANidine (ZANAFLEX) 4 MG tablet Take 1 tablet (4 mg total) by mouth at bedtime.  ? amoxicillin (AMOXIL) 500 MG capsule Take 1  capsule (500 mg total) by mouth 3 (three) times daily. (Patient not taking: Reported on 02/03/2022)  ? cyclobenzaprine (FLEXERIL) 10 MG tablet Take 1 tablet (10 mg total) by mouth at bedtime. (Patient not taking: Reported on 07/14/2021)  ? levothyroxine (SYNTHROID) 88 MCG tablet Take 1 tablet (88 mcg total) by mouth daily before breakfast. (Patient not taking: Reported on 02/03/2022)  ? naproxen sodium (ALEVE) 220 MG tablet Take 440 mg by mouth 2 (two) times daily as needed (pain/headache). (Patient not taking: Reported on 02/05/2022)  ? [DISCONTINUED] pantoprazole (PROTONIX) 40 MG tablet Take 1 tablet (40 mg total) by mouth daily. (Patient not taking: Reported on 02/05/2022)  ? [DISCONTINUED] prochlorperazine (COMPAZINE) 10 MG tablet Take 1 tablet (10 mg total) by mouth every 6 (six) hours as needed for refractory nausea / vomiting (Use for nausea and / or vomiting unresolved with ondansetron (Zofran)).  ? [DISCONTINUED] traMADol (ULTRAM) 50 MG tablet Take 1-2 tablets (50-100 mg total) by mouth every 6 (six) hours as needed for moderate pain or severe pain. (Patient not taking: Reported on 02/03/2022)  ? ?No facility-administered encounter medications on file as of 02/05/2022.  ? ? ? ?REVIEW OF SYSTEMS  : All other systems reviewed and negative except where noted in the History of Present Illness. ? ? ?PHYSICAL EXAM: ?BP 136/88   Pulse 88   Ht 5' 6" (1.676 m)   Wt (!) 365 lb 12.8 oz (165.9 kg)   LMP 01/20/2022 (Exact Date)   SpO2 97%   BMI 59.04 kg/m?  ?General: Well developed AA female in no acute distress ?Head: Normocephalic and atraumatic ?Eyes:  Sclerae anicteric, conjunctiva pink. ?Ears: Normal auditory acuity ?Lungs: Clear throughout to auscultation; no W/R/R. ?Heart: Regular rate and rhythm; no M/R/G. ?Abdomen: Soft, non-distended.  BS present.  Some lower abdominal TTP.  Ostomy noted on the left. ?Rectal:  Will be done at the time of colonoscopy. ?Musculoskeletal: Symmetrical with no gross deformities   ?Skin: No lesions on visible extremities ?Extremities: No edema  ?Neurological: Alert oriented x 4, grossly non-focal ?Psychological:  Alert and cooperative. Normal mood and affect ? ?ASSESSMENT AND PLAN: ?*36-year-old female with issues of recurrent diverticulitis since 2019.  Ended up requiring surgery with colostomy in December 2022.  Now scheduled to have reversal and takedown on 5/18.  Needs colonoscopy prior to that.  She has never had a colonoscopy in the past.  BMI is greater than 50, requiring hospital procedure.  We will contact her back to schedule.  She may need to reschedule her surgery as I am not sure that we will be able to accommodate colonoscopy prior to her current surgery date. ?*Family history of colon cancer in her father ? ? ?CC:  Fleming, Zelda W, NP ? ?  ?

## 2022-02-05 NOTE — H&P (View-Only) (Signed)
? ? ? ?02/05/2022 ?Lori Mcknight ?[id number] ?[birth year] ? ? ?HISTORY OF PRESENT ILLNESS: This is a 36 year old female who is new to our office.  She is been referred here by Dr. Johney Maine to discuss a colonoscopy.  She tells me that she has had issues with recurrent diverticulitis since about 2019.  She ended up with abscesses, etc. in December 2022 requiring surgery.  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  She is scheduled for reversal and takedown of her ostomy on 02/27/2022.  Dr. Johney Maine wanted her to have a colonoscopy prior to that.  She has family history of colon cancer in her father.  The patient herself has never had a colonoscopy. ? 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 ? ?Past Medical History:  ?Diagnosis Date  ? Acute diverticulitis 08/07/2019  ? Anemia   ? history of anemia  ? Anxiety   ? Asthma   ? inhaler as needed  ? Chest pain of uncertain etiology   ? intermittent left side chest pain  ? Chlamydia   ? Depression   ? Diverticulitis 2019  ? Diverticulitis of intestine with abscess 07/13/2021  ? Endometriosis   ? Genital herpes   ? Headache   ? MIGRAINES  ? Hypertension   ? no meds since April  ? Hypothyroidism   ? Obese   ? Obesity, Class III, BMI 40-49.9 (morbid obesity) ([place]) 11/06/2011  ? PCOS (polycystic ovarian syndrome)   ? PONV (postoperative nausea and vomiting)   ? Shortness of breath   ? on exertion from weight  ? ?Past Surgical History:  ?Procedure Laterality Date  ? DILATION AND CURETTAGE OF UTERUS N/A 04/29/2018  ? Procedure: DILATATION AND CURETTAGE;  Surgeon: [name], MD;  Location: [facility];  Service: Gynecology;  Laterality: N/A;  ? HYSTEROSCOPY WITH D & C N/A 04/06/2014  ? Procedure: DILATATION AND CURETTAGE /HYSTEROSCOPY ;  Surgeon: Osborne Oman, MD;  Location: [facility];  Service: Gynecology;  Laterality: N/A;  ? LAPAROSCOPY N/A 04/29/2018  ?  Procedure: LAPAROSCOPY DIAGNOSTIC WITH PERITONEAL BIOPSY;  Surgeon: [name], MD;  Location: [facility];  Service: Gynecology;  Laterality: N/A;  ? LEFT HEART CATH AND CORONARY ANGIOGRAPHY N/A 12/12/2016  ? Procedure: Left Heart Cath and Coronary Angiography;  Surgeon: Peter M Martinique, MD;  Location: [facility];  Service: Cardiovascular;  Laterality: N/A;  ? WISDOM TOOTH EXTRACTION    ? ? reports that she has been smoking cigarettes. She has been smoking an average of .25 packs per day. She has never used smokeless tobacco. She reports that she does not currently use alcohol. She reports that she does not currently use drugs after having used the following drugs: Marijuana. ?family history includes Cancer in her father; Colon cancer in her cousin and father; Depression in her mother; Diabetes in her father, maternal grandfather, and sister; Esophageal cancer in her maternal grandmother; Heart disease in her father, maternal grandfather, maternal grandmother, and mother; Hypertension in her father, mother, and sister; Kidney disease in her mother; Sleep apnea in her mother. ?Allergies  ?Allergen Reactions  ? Dilaudid [Hydromorphone] Itching  ?  Tolerates morphine fine  ? ? ?  ?Outpatient Encounter Medications as of 02/05/2022  ?Medication Sig  ? albuterol (VENTOLIN HFA) 108 (90 Base) MCG/ACT inhaler Inhale 2 puffs into the lungs every 6 (six) hours as needed for  wheezing or shortness of breath.  ? gabapentin (NEURONTIN) 300 MG capsule Take 1 capsule (300 mg total) by mouth 3 (three) times daily. Increase to 4x/day as needed  ? naproxen (NAPROSYN) 500 MG tablet Take 1 tablet (500 mg total) by mouth 2 (two) times daily with a meal.  ? polyethylene glycol (MIRALAX / GLYCOLAX) 17 g packet Take 17 g by mouth 2 (two) times daily as needed for moderate constipation or severe constipation.  ? tiZANidine (ZANAFLEX) 4 MG tablet Take 1 tablet (4 mg total) by mouth at bedtime.  ? amoxicillin (AMOXIL) 500 MG capsule Take 1  capsule (500 mg total) by mouth 3 (three) times daily. (Patient not taking: Reported on 02/03/2022)  ? cyclobenzaprine (FLEXERIL) 10 MG tablet Take 1 tablet (10 mg total) by mouth at bedtime. (Patient not taking: Reported on 07/14/2021)  ? levothyroxine (SYNTHROID) 88 MCG tablet Take 1 tablet (88 mcg total) by mouth daily before breakfast. (Patient not taking: Reported on 02/03/2022)  ? naproxen sodium (ALEVE) 220 MG tablet Take 440 mg by mouth 2 (two) times daily as needed (pain/headache). (Patient not taking: Reported on 02/05/2022)  ? [DISCONTINUED] pantoprazole (PROTONIX) 40 MG tablet Take 1 tablet (40 mg total) by mouth daily. (Patient not taking: Reported on 02/05/2022)  ? [DISCONTINUED] prochlorperazine (COMPAZINE) 10 MG tablet Take 1 tablet (10 mg total) by mouth every 6 (six) hours as needed for refractory nausea / vomiting (Use for nausea and / or vomiting unresolved with ondansetron (Zofran)).  ? [DISCONTINUED] traMADol (ULTRAM) 50 MG tablet Take 1-2 tablets (50-100 mg total) by mouth every 6 (six) hours as needed for moderate pain or severe pain. (Patient not taking: Reported on 02/03/2022)  ? ?No facility-administered encounter medications on file as of 02/05/2022.  ? ? ? ?REVIEW OF SYSTEMS  : All other systems reviewed and negative except where noted in the History of Present Illness. ? ? ?PHYSICAL EXAM: ?BP 136/88   Pulse 88   Ht 5\' 6"  (1.676 m)   Wt (!) 365 lb 12.8 oz (165.9 kg)   LMP 01/20/2022 (Exact Date)   SpO2 97%   BMI 59.04 kg/m?  ?General: Well developed AA female in no acute distress ?Head: Normocephalic and atraumatic ?Eyes:  Sclerae anicteric, conjunctiva pink. ?Ears: Normal auditory acuity ?Lungs: Clear throughout to auscultation; no W/R/R. ?Heart: Regular rate and rhythm; no M/R/G. ?Abdomen: Soft, non-distended.  BS present.  Some lower abdominal TTP.  Ostomy noted on the left. ?Rectal:  Will be done at the time of colonoscopy. ?Musculoskeletal: Symmetrical with no gross deformities   ?Skin: No lesions on visible extremities ?Extremities: No edema  ?Neurological: Alert oriented x 4, grossly non-focal ?Psychological:  Alert and cooperative. Normal mood and affect ? ?ASSESSMENT AND PLAN: ?*36 year old female with issues of recurrent diverticulitis since 2019.  Ended up requiring surgery with colostomy in December 2022.  Now scheduled to have reversal and takedown on 5/18.  Needs colonoscopy prior to that.  She has never had a colonoscopy in the past.  BMI is greater than 50, requiring hospital procedure.  We will contact her back to schedule.  She may need to reschedule her surgery as I am not sure that we will be able to accommodate colonoscopy prior to her current surgery date. ?*Family history of colon cancer in her father ? ? ?CC:  [name], NP ? ?  ?

## 2022-02-05 NOTE — Patient Instructions (Signed)
We will contact you with a colonoscopy date and time.  ? ? ?If you are age 37 or younger, your body mass index should be between 19-25. Your Body mass index is 59.04 kg/m?Marland Kitchen If this is out of the aformentioned range listed, please consider follow up with your Primary Care Provider.  ? ?________________________________________________________ ? ?[facility] providers would like to encourage you to use [facility] to communicate with providers for non-urgent requests or questions.  Due to long hold times on the telephone, sending your provider a message by [facility] may be a faster and more efficient way to get a response.  Please allow 48 business hours for a response.  Please remember that this is for non-urgent requests.  ?_______________________________________________________ ? ?

## 2022-02-06 ENCOUNTER — Telehealth: Payer: Self-pay

## 2022-02-06 NOTE — Telephone Encounter (Signed)
Author: Rachael Fee, MD Service: Gastroenterology Author Type: Physician  ?Filed: 02/06/2022  9:29 AM Encounter Date: 02/05/2022 Status: Signed  ?Editor: Rachael Fee, MD (Physician)  ?   ?   ?I do not have availability to help her with a hospital colonoscopy before her currently scheduled ostomy takedown. Can you let the surgery team know that we will get her in as soon as possible but it will probably be a while (given the critical staff shortage in hospital endo units) and they need to reschedule her surgery.  Apologize to the patient and the referring team. thanks ?  ?Odessa Morren, ?Can you offer her my first available outpatient hospital time [facility].  thanks  ?  ? ?

## 2022-02-06 NOTE — Progress Notes (Signed)
I do not have availability to help her with a hospital colonoscopy before her currently scheduled ostomy takedown. Can you let the surgery team know that we will get her in as soon as possible but it will probably be a while (given the critical staff shortage in hospital endo units) and they need to reschedule her surgery.  Apologize to the patient and the referring team. thanks ? ?Patty, ?Can you offer her my first available outpatient hospital time [facility].  thanks ?

## 2022-02-06 NOTE — Telephone Encounter (Signed)
-----   Message from Rachael Fee, MD sent at 02/06/2022  9:23 AM EDT ----- ? ? ? ?----- Message ----- ?From: Leta Baptist, PA-C ?Sent: 02/06/2022   9:05 AM EDT ?To: Rachael Fee, MD ? ?

## 2022-02-07 ENCOUNTER — Other Ambulatory Visit: Payer: Self-pay

## 2022-02-07 DIAGNOSIS — K5720 Diverticulitis of large intestine with perforation and abscess without bleeding: Secondary | ICD-10-CM

## 2022-02-07 DIAGNOSIS — Z933 Colostomy status: Secondary | ICD-10-CM

## 2022-02-07 MED ORDER — PEG 3350-KCL-NA BICARB-NACL 420 G PO SOLR: 4000.0000 mL | Freq: Once | ORAL | 0 refills | Status: AC

## 2022-02-07 NOTE — Telephone Encounter (Signed)
Colon scheduled, pt instructed and medications reviewed.  Patient instructions mailed to home.  Patient to call with any questions or concerns. ? ?Dr Michaell Cowing the pt has been scheduled for 7/20 for colon.   ?

## 2022-02-07 NOTE — Telephone Encounter (Signed)
The pt has been scheduled for colon at [facility] with DJ on 05/01/22 at 730 am. ? ?Left message on machine to call back  ?

## 2022-02-07 NOTE — Telephone Encounter (Signed)
RE: Need to reschedule surgery ? ?Received: Yesterday ?Rachael Fee, MD  Karie Soda, MD; Sheralyn Boatman; [facility] Surgery Schedulers; Ethlyn Gallery; Loretha Stapler, RN ?Cc: Leta Baptist, PA-C ?With her BMI>50 we cannot safely care for her in our outpatient ASC and so are left to the scheduling issues in the hospital.  I'm currently booked through June but if anything opens sooner we'll offer her a spot.   ?  ?   ?Previous Messages ?  ?----- Message -----  ?From: Karie Soda, MD  ?Sent: 02/06/2022   3:08 PM EDT  ?To: Rachael Fee, MD, Ethlyn Gallery, *  ?Subject: Need to reschedule surgery ?                  ? ?I guess I will see if our office can postpone the colostomy takedown for a month or 2.  Let us know when you or gastroenterology partners can do the colonoscopy, and I will try and find time the next day or so.  ?----- Message -----  ?From: Leta Baptist, PA-C  ?Sent: 02/06/2022   9:56 AM EDT  ?To: Karie Soda, MD  ? ?Please see my office note and addendum from Dr. Christella Hartigan.  Unfortunately hospital availability is extremely limited.  So sorry for the inconvenience. ?

## 2022-02-11 ENCOUNTER — Telehealth: Payer: Self-pay | Admitting: Gastroenterology

## 2022-02-11 NOTE — Telephone Encounter (Signed)
Left detailed message for patient to call back with any questions regarding updated prep instructions that have been sent to mychart. ?

## 2022-02-11 NOTE — Telephone Encounter (Signed)
Inbound call from patient stating she needs new prep instructions due to having her procedure moved up. Patient is scheduled for 5/4 at 8:15 at [facility]. Please advise.  ?

## 2022-02-12 NOTE — Progress Notes (Signed)
COVID Vaccine Completed: ?Date COVID Vaccine completed: ?Has received booster: ?COVID vaccine manufacturer: [employer]  ? ?Date of COVID positive in last 90 days: ? ?PCP - Bertram Denver, NP ?Cardiologist -  [facility] (last OV 2018) ? ?Chest x-ray - 02-03-22 Epic ?EKG - 09-12-21 Epic ?Stress Test -  ?ECHO - 09-14-21 Epic ?Cardiac Cath - greater than 2 years Epic ?Pacemaker/ICD device last checked: ?Spinal Cord Stimulator: ? ?Bowel Prep -  ? ?Sleep Study -  ?CPAP -  ? ?Fasting Blood Sugar -  ?Checks Blood Sugar _____ times a day ? ?Blood Thinner Instructions: ?Aspirin Instructions: ?Last Dose: ? ?Activity level:  Can go up a flight of stairs and perform activities of daily living without stopping and without symptoms of chest pain or shortness of breath. ?  Able to exercise without symptoms ? ?Unable to go up a flight of stairs without symptoms of  ?   ? ?Anesthesia review:  Hx of chronic chest pai ? ?Patient denies shortness of breath, fever, cough and chest pain at PAT appointment ? ? ?Patient verbalized understanding of instructions that were given to them at the PAT appointment. Patient was also instructed that they will need to review over the PAT instructions again at home before surgery.  ?

## 2022-02-12 NOTE — Patient Instructions (Addendum)
DUE TO COVID-19 ONLY TWO VISITORS  (aged 37 and older)  IS ALLOWED TO COME WITH YOU AND STAY IN THE WAITING ROOM ONLY DURING PRE OP AND PROCEDURE.   ?**NO VISITORS ARE ALLOWED IN THE SHORT STAY AREA OR RECOVERY ROOM!!** ? ?IF YOU WILL BE ADMITTED INTO THE HOSPITAL YOU ARE ALLOWED ONLY FOUR SUPPORT PEOPLE DURING VISITATION HOURS ONLY (7 AM -8PM)   ?The support person(s) must pass our screening, gel in and out ?Visitors GUEST BADGE MUST BE WORN VISIBLY  ?One adult visitor may remain with you overnight and MUST be in the room by 8 P.M.  ? ?You are not required to quarantine ?Hand Hygiene often ?Do NOT share personal items ?Notify your provider if you are in close contact with someone who has COVID or you develop fever 100.4 or greater, new onset of sneezing, cough, sore throat, shortness of breath or body aches. ? ?     ? Your procedure is scheduled on:  02-27-22 ? ? [facility] ? ?  Report to admitting at 8:45 AM ? ? Call this number if you have problems the morning of surgery [phone] ? ?Follow a clear liquid diet day of prep to prevent dehydration ? ? After Midnight you may have the following liquids until 8:00 AM DAY OF SURGERY ? ?Water ?Black Coffee (sugar ok, NO MILK/CREAM OR CREAMERS)  ?Tea (sugar ok, NO MILK/CREAM OR CREAMERS) regular and decaf                             ?Plain Jell-O (NO RED)                                           ?Fruit ices (not with fruit pulp, NO RED)                                     ?Popsicles (NO RED)                                                                  ?Juice: apple, WHITE grape, WHITE cranberry ?Sports drinks like Gatorade (NO RED) ?Clear broth(vegetable,chicken,beef) ? ?             ? Drink two Pre-Surgery Ensures the night before surgery (complete by 10 PM) ?  ?  ?The day of surgery:  ?Drink ONE (1) Pre-Surgery Clear Ensure at 8:00 AM the morning of surgery. Drink in one sitting. Do not sip.  ?This drink was given to you during  your hospital  ?pre-op appointment visit. ?Nothing else to drink after completing the Pre-Surgery Clear Ensure ?  ?       If you have questions, please contact your surgeon?s office. ? ? ?FOLLOW BOWEL PREP AND ANY ADDITIONAL PRE OP INSTRUCTIONS YOU RECEIVED FROM YOUR SURGEON'S OFFICE!!! ? ?Bisacodyl 20 mg - Give with water the day prior to surgery.   ? ?Miralax  255 g - Mix with 64 oz Gatorade/Powerade.  Drink gradually over the next few hours (  8 oz glass every 15-30 minutes) until gone the day prior to surgery.  ? ?Metronidazole 1000 mg - At 2 pm, 3 pm and 10 pm after Miralax bowel prep the day prior to surgery. ? ?Neomycin 1000 mg - At 2 pm, 3 pm and 10 pm after Miralax bowel prep the day prior to surgery.   ?  ?  ?Oral Hygiene is also important to reduce your risk of infection.                                    ?Remember - BRUSH YOUR TEETH THE MORNING OF SURGERY WITH YOUR REGULAR TOOTHPASTE ? ? Do NOT smoke after Midnight ? ? Take these medicines the morning of surgery with A SIP OF WATER: Okay to use Albuterol inhaler (bring with you) ? ?DO NOT TAKE ANY ORAL DIABETIC MEDICATIONS DAY OF YOUR SURGERY ? ?Bring CPAP mask and tubing day of surgery. ?                  ?           You may not have any metal on your body including hair pins, jewelry, and body piercing ? ?           Do not wear make-up, lotions, powders, perfumes or deodorant ? ?Do not wear nail polish including gel and S&S, artificial/acrylic nails, or any other type of covering on natural nails including finger and toenails. If you have artificial nails, gel coating, etc. that needs to be removed by a nail salon please have this removed prior to surgery or surgery may need to be canceled/ delayed if the surgeon/ anesthesia feels like they are unable to be safely monitored.  ? ?Do not shave  48 hours prior to surgery.  ? ? Do not bring valuables to the hospital. [facility] IS NOT RESPONSIBLE   FOR VALUABLES. ? ? Contacts, dentures or bridgework may  not be worn into surgery. ? ? Bring small overnight bag day of surgery. ?  ?Patients discharged on the day of surgery will not be allowed to drive home.  Someone NEEDS to stay with you for the first 24 hours after anesthesia. ? ?Special Instructions: Bring a copy of your healthcare power of attorney and living will documents the day of surgery if you haven't scanned them before. ? ?Please read over the following fact sheets you were given: IF YOU HAVE QUESTIONS ABOUT YOUR PRE-OP INSTRUCTIONS PLEASE CALL [phone] ? ?[place] - Preparing for Surgery ?Before surgery, you can play an important role.  Because skin is not sterile, your skin needs to be as free of germs as possible.  You can reduce the number of germs on your skin by washing with CHG (chlorahexidine gluconate) soap before surgery.  CHG is an antiseptic cleaner which kills germs and bonds with the skin to continue killing germs even after washing. ?Please DO NOT use if you have an allergy to CHG or antibacterial soaps.  If your skin becomes reddened/irritated stop using the CHG and inform your nurse when you arrive at Short Stay. ?Do not shave (including legs and underarms) for at least 48 hours prior to the first CHG shower.  You may shave your face/neck. ? ?Please follow these instructions carefully: ? 1.  Shower with CHG Soap the night before surgery and the  morning of surgery. ? 2.  If you choose to  wash your hair, wash your hair first as usual with your normal  shampoo. ? 3.  After you shampoo, rinse your hair and body thoroughly to remove the shampoo.                            ? 4.  Use CHG as you would any other liquid soap.  You can apply chg directly to the skin and wash.  Gently with a scrungie or clean washcloth. ? 5.  Apply the CHG Soap to your body ONLY FROM THE NECK DOWN.   Do   not use on face/ open      ?                     Wound or open sores. Avoid contact with eyes, ears mouth and   genitals (private parts).  ?                      Engineering geologist,  Genitals (private parts) with your normal soap. ?            6.  Wash thoroughly, paying special attention to the area where your    surgery  will be performed. ? 7.  Thoroughly rinse your body with warm water from the neck down. ? 8.  DO NOT shower/wash with your normal soap after using and rinsing off the CHG Soap. ?               9.  Pat yourself dry with a clean towel. ?           10.  Wear clean pajamas. ?           11.  Place clean sheets on your bed the night of your first shower and do not  sleep with pets. ?Day of Surgery : ?Do not apply any lotions/deodorants the morning of surgery.  Please wear clean clothes to the hospital/surgery center. ? ?FAILURE TO FOLLOW THESE INSTRUCTIONS MAY RESULT IN THE CANCELLATION OF YOUR SURGERY ? ?PATIENT SIGNATURE_________________________________ ? ?NURSE SIGNATURE__________________________________ ? ?________________________________________________________________________  ?  ? ?Incentive Spirometer ? ?An incentive spirometer is a tool that can help keep your lungs clear and active. This tool measures how well you are filling your lungs with each breath. Taking long deep breaths may help reverse or decrease the chance of developing breathing (pulmonary) problems (especially infection) following: ?A long period of time when you are unable to move or be active. ?BEFORE THE PROCEDURE  ?If the spirometer includes an indicator to show your best effort, your nurse or respiratory therapist will set it to a desired goal. ?If possible, sit up straight or lean slightly forward. Try not to slouch. ?Hold the incentive spirometer in an upright position. ?INSTRUCTIONS FOR USE  ?Sit on the edge of your bed if possible, or sit up as far as you can in bed or on a chair. ?Hold the incentive spirometer in an upright position. ?Breathe out normally. ?Place the mouthpiece in your mouth and seal your lips tightly around it. ?Breathe in slowly and as deeply as possible, raising the  piston or the ball toward the top of the column. ?Hold your breath for 3-5 seconds or for as long as possible. Allow the piston or ball to fall to the bottom of the column. ?Remove the mouthpiece from

## 2022-02-13 ENCOUNTER — Inpatient Hospital Stay (HOSPITAL_COMMUNITY)
Admission: RE | Admit: 2022-02-13 | Discharge: 2022-02-13 | Disposition: A | Discharge status: Home / Self Care | Payer: Medicaid Other | Source: Ambulatory Visit

## 2022-02-13 ENCOUNTER — Ambulatory Visit (HOSPITAL_BASED_OUTPATIENT_CLINIC_OR_DEPARTMENT_OTHER): Payer: Self-pay | Admitting: Anesthesiology

## 2022-02-13 ENCOUNTER — Other Ambulatory Visit: Payer: Self-pay

## 2022-02-13 ENCOUNTER — Encounter (HOSPITAL_COMMUNITY)
Admission: RE | Disposition: A | Discharge status: Home / Self Care | Payer: Self-pay | Source: Home / Self Care | Attending: Gastroenterology

## 2022-02-13 ENCOUNTER — Ambulatory Visit (HOSPITAL_COMMUNITY)
Admission: RE | Admit: 2022-02-13 | Discharge: 2022-02-13 | Disposition: A | Discharge status: Home / Self Care | Payer: Self-pay | Attending: Gastroenterology | Admitting: Gastroenterology

## 2022-02-13 ENCOUNTER — Ambulatory Visit (HOSPITAL_COMMUNITY): Payer: Self-pay | Admitting: Anesthesiology

## 2022-02-13 ENCOUNTER — Encounter (HOSPITAL_COMMUNITY): Payer: Self-pay | Admitting: Gastroenterology

## 2022-02-13 DIAGNOSIS — Z433 Encounter for attention to colostomy: Secondary | ICD-10-CM

## 2022-02-13 DIAGNOSIS — Z01818 Encounter for other preprocedural examination: Secondary | ICD-10-CM

## 2022-02-13 DIAGNOSIS — E039 Hypothyroidism, unspecified: Secondary | ICD-10-CM | POA: Insufficient documentation

## 2022-02-13 DIAGNOSIS — Z933 Colostomy status: Secondary | ICD-10-CM | POA: Insufficient documentation

## 2022-02-13 DIAGNOSIS — Z800 Family history of malignant neoplasm of digestive organs: Secondary | ICD-10-CM | POA: Insufficient documentation

## 2022-02-13 DIAGNOSIS — I10 Essential (primary) hypertension: Secondary | ICD-10-CM | POA: Insufficient documentation

## 2022-02-13 DIAGNOSIS — K5720 Diverticulitis of large intestine with perforation and abscess without bleeding: Secondary | ICD-10-CM

## 2022-02-13 DIAGNOSIS — Z8719 Personal history of other diseases of the digestive system: Secondary | ICD-10-CM | POA: Insufficient documentation

## 2022-02-13 DIAGNOSIS — Z6841 Body Mass Index (BMI) 40.0 and over, adult: Secondary | ICD-10-CM | POA: Insufficient documentation

## 2022-02-13 DIAGNOSIS — K529 Noninfective gastroenteritis and colitis, unspecified: Secondary | ICD-10-CM | POA: Insufficient documentation

## 2022-02-13 DIAGNOSIS — I2510 Atherosclerotic heart disease of native coronary artery without angina pectoris: Secondary | ICD-10-CM

## 2022-02-13 DIAGNOSIS — K5730 Diverticulosis of large intestine without perforation or abscess without bleeding: Secondary | ICD-10-CM

## 2022-02-13 DIAGNOSIS — F17210 Nicotine dependence, cigarettes, uncomplicated: Secondary | ICD-10-CM | POA: Insufficient documentation

## 2022-02-13 HISTORY — PX: COLONOSCOPY WITH PROPOFOL: SHX5780

## 2022-02-13 SURGERY — COLONOSCOPY WITH PROPOFOL
Anesthesia: Monitor Anesthesia Care

## 2022-02-13 MED ORDER — PROPOFOL 10 MG/ML IV BOLUS
INTRAVENOUS | Status: DC | PRN
  Administered 2022-02-13 (×3): 50 mg via INTRAVENOUS

## 2022-02-13 MED ORDER — MIDAZOLAM HCL 2 MG/2ML IJ SOLN
INTRAMUSCULAR | Status: AC
  Filled 2022-02-13: qty 2

## 2022-02-13 MED ORDER — SODIUM CHLORIDE 0.9 % IV SOLN: INTRAVENOUS | Status: DC

## 2022-02-13 MED ORDER — LACTATED RINGERS IV SOLN: INTRAVENOUS | Status: DC | PRN

## 2022-02-13 MED ORDER — PROPOFOL 1000 MG/100ML IV EMUL
INTRAVENOUS | Status: AC
  Filled 2022-02-13: qty 100

## 2022-02-13 MED ORDER — LIDOCAINE HCL (CARDIAC) PF 100 MG/5ML IV SOSY
PREFILLED_SYRINGE | INTRAVENOUS | Status: DC | PRN
  Administered 2022-02-13: 100 mg via INTRAVENOUS

## 2022-02-13 MED ORDER — MIDAZOLAM HCL 2 MG/2ML IJ SOLN
INTRAMUSCULAR | Status: DC | PRN
  Administered 2022-02-13: 2 mg via INTRAVENOUS

## 2022-02-13 SURGICAL SUPPLY — 22 items
ELECT REM PT RETURN 9FT ADLT (ELECTROSURGICAL)
ELECTRODE REM PT RTRN 9FT ADLT (ELECTROSURGICAL) IMPLANT
FCP BXJMBJMB 240X2.8X (CUTTING FORCEPS)
FLOOR PAD 36X40 (MISCELLANEOUS) ×2
FORCEPS BIOP RAD 4 LRG CAP 4 (CUTTING FORCEPS) IMPLANT
FORCEPS BIOP RJ4 240 W/NDL (CUTTING FORCEPS)
FORCEPS BXJMBJMB 240X2.8X (CUTTING FORCEPS) IMPLANT
INJECTOR/SNARE I SNARE (MISCELLANEOUS) IMPLANT
LUBRICANT JELLY 4.5OZ STERILE (MISCELLANEOUS) IMPLANT
MANIFOLD NEPTUNE II (INSTRUMENTS) IMPLANT
NDL SCLEROTHERAPY 25GX240 (NEEDLE) IMPLANT
NEEDLE SCLEROTHERAPY 25GX240 (NEEDLE) IMPLANT
PAD FLOOR 36X40 (MISCELLANEOUS) ×2 IMPLANT
PROBE APC STR FIRE (PROBE) IMPLANT
PROBE INJECTION GOLD (MISCELLANEOUS)
PROBE INJECTION GOLD 7FR (MISCELLANEOUS) IMPLANT
SNARE ROTATE MED OVAL 20MM (MISCELLANEOUS) IMPLANT
SYR 50ML LL SCALE MARK (SYRINGE) IMPLANT
TRAP SPECIMEN MUCOUS 40CC (MISCELLANEOUS) IMPLANT
TUBING ENDO SMARTCAP PENTAX (MISCELLANEOUS) IMPLANT
TUBING IRRIGATION ENDOGATOR (MISCELLANEOUS) ×3 IMPLANT
WATER STERILE IRR 1000ML POUR (IV SOLUTION) IMPLANT

## 2022-02-13 NOTE — Op Note (Signed)
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                          resection and colostomy Dr. Michaell Cowing 09/2021; planning  ?                          for colostomy reversal later this month; also her  ?                          father had colon cancer. ?Providers:                Rachael Fee, MD, Tillie Fantasia, RN, Rayetta Pigg  ?                          [place], Technician ?Referring MD:             Estelle Grumbles, MD ?Medicines:                Monitored Anesthesia Care ?Complications:            No immediate complications. Estimated blood loss:  ?                          None. ?Estimated Blood Loss:     Estimated blood loss: none. ?Procedure:                Pre-Anesthesia Assessment: ?                          - Prior to the procedure, a History and Physical  ?                          was performed, and patient medications and  ?                          allergies were reviewed. The patient's tolerance of  ?                          previous anesthesia was also reviewed. The risks  ?                          and benefits of the procedure and the sedation  ?                          options and risks were discussed with the patient.  ?                          All questions were answered, and informed consent  ?                          was  obtained. Prior Anticoagulants: The patient has  ?                          taken no previous anticoagulant or antiplatelet  ?                          agents. ASA Grade Assessment: IV - A patient with  ?                          severe systemic disease that is a constant threat  ?                          to life. After reviewing the risks and benefits,  ?                          the patient was  deemed in satisfactory condition to  ?                          undergo the procedure. ?                          After obtaining informed consent, the colonoscope  ?                          was passed under direct vision. Throughout the  ?                          procedure, the patient's blood pressure, pulse, and  ?                          oxygen saturations were monitored continuously. The  ?                          CF-HQ190L ([id number]) Olympus colonoscope was  ?                          introduced through the anus and advanced to the  ?                          proximal end of the Hartman's pouch. Aftwards the  ?                          colonoscope was insersted into the left sided  ?                          ostomy and advanced to the cecum, identified by  ?                          appendiceal orifice and ileocecal valve. The  ?                          colonoscopy was performed without difficulty. The  ?  patient tolerated the procedure well. The quality  ?                          of the bowel preparation was good. The ileocecal  ?                          valve, appendiceal orifice, and rectum were  ?                          photographed. ?Scope In: 8:24:08 AM ?Scope Out: 8:34:36 AM ?Scope Withdrawal Time: 0 hours 3 minutes 44 seconds  ?Total Procedure Duration: 0 hours 10 minutes 28 seconds  ?Findings: ?     Via anus, to the proximal end of the Hartman's pouch: ?     1. 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 ?     2. Scattered adherent mucous. ?     3. Normal retroflex views of the anus. ?     Via left sided ostomy. ?     1. A few small left sided diverticulum. ?     2. Otherwise normal examination. ?Impression:               Via anus, to the proximal end of the Hartman's  ?                          pouch: ?                          1. Typical mild to moderate appearing diversion  ?                          colitis. ?                          2. Scattered  adherent mucous. ?                          3. Normal retroflex views of the anus. ?                          Via left sided ostomy. ?                          1. A few small left sided diverticulum. ?                          2. Otherwise normal examination. ?                          No polyps or cancers. ?Moderate Sedation: ?     Not Applicable - Patient had care per Anesthesia. ?Recommendation:           - Patient has a contact number available for  ?                          emergencies. The signs and symptoms of potential  ?  delayed complications were discussed with the  ?                          patient. Return to normal activities tomorrow.  ?                          Written discharge instructions were provided to the  ?                          patient. ?                          - Resume previous diet. ?                          - Continue present medications. ?                          - Repeat colonoscopy in 5 years for screening  ?                          purposes. ?Procedure Code(s):        --- Professional --- ?                          [phone], Colonoscopy, flexible; diagnostic, including  ?                          collection of specimen(s) by brushing or washing,  ?                          when performed (separate procedure) ?Diagnosis Code(s):        --- Professional --- ?                          Z12.11, Encounter for screening for malignant  ?                          neoplasm of colon ?CPT copyright 2019 [employer] Medical Association. All rights reserved. ?The codes documented in this report are preliminary and upon coder review may  ?be revised to meet current compliance requirements. ?Rachael Fee, MD ?02/13/2022 8:43:02 AM ?This report has been signed electronically. ?Number of Addenda: 0 ?

## 2022-02-13 NOTE — Anesthesia Postprocedure Evaluation (Signed)
Anesthesia Post Note ? ?Patient: Lori Mcknight ? ?Procedure(s) Performed: COLONOSCOPY WITH PROPOFOL ? ?  ? ?Patient location during evaluation: PACU ?Anesthesia Type: MAC ?Level of consciousness: awake and alert ?Pain management: pain level controlled ?Vital Signs Assessment: post-procedure vital signs reviewed and stable ?Respiratory status: spontaneous breathing, nonlabored ventilation, respiratory function stable and patient connected to nasal cannula oxygen ?Cardiovascular status: stable and blood pressure returned to baseline ?Postop Assessment: no apparent nausea or vomiting ?Anesthetic complications: no ? ? ?No notable events documented. ? ?Last Vitals:  ?Vitals:  ? 02/13/22 0851 02/13/22 0900  ?BP: 136/78 135/70  ?Pulse: 72 72  ?Resp: 17 (!) 23  ?Temp:    ?SpO2: 99% 98%  ?  ?Last Pain:  ?Vitals:  ? 02/13/22 0900  ?TempSrc:   ?PainSc: 0-No pain  ? ? ?  ?  ?  ?  ?  ?  ? ?Kayleah Appleyard S ? ? ? ? ?

## 2022-02-13 NOTE — Transfer of Care (Signed)
Immediate Anesthesia Transfer of Care Note ? ?Patient: Lori Mcknight ? ?Procedure(s) Performed: COLONOSCOPY WITH PROPOFOL ? ?Patient Location: PACU ? ?Anesthesia Type:General ? ?Level of Consciousness: awake, alert , oriented and patient cooperative ? ?Airway & Oxygen Therapy: Patient Spontanous Breathing and Patient connected to face mask oxygen ? ?Post-op Assessment: Report given to RN, Post -op Vital signs reviewed and stable and Patient moving all extremities X 4 ? ?Post vital signs: Reviewed and stable ? ?Last Vitals:  ?Vitals Value Taken Time  ?BP    ?Temp    ?Pulse 87 02/13/22 0841  ?Resp 17 02/13/22 0841  ?SpO2 97 % 02/13/22 0841  ?Vitals shown include unvalidated device data. ? ?Last Pain:  ?Vitals:  ? 02/13/22 0716  ?TempSrc: Temporal  ?PainSc: 0-No pain  ?   ? ?  ? ?Complications: No notable events documented. ?

## 2022-02-13 NOTE — Interval H&P Note (Signed)
History and Physical Interval Note: ? ?02/13/2022 ?7:03 AM ? ?Lori Mcknight  has presented today for surgery, with the diagnosis of colostomy takedown.  The various methods of treatment have been discussed with the patient and family. After consideration of risks, benefits and other options for treatment, the patient has consented to  Procedure(s): ?COLONOSCOPY WITH PROPOFOL (N/A) as a surgical intervention.  The patient's history has been reviewed, patient examined, no change in status, stable for surgery.  I have reviewed the patient's chart and labs.  Questions were answered to the patient's satisfaction.   ? ? ?Rachael Fee ? ? ?

## 2022-02-13 NOTE — Anesthesia Procedure Notes (Signed)
Procedure Name: [place] ?Date/Time: 02/13/2022 8:20 AM ?Performed by: Jonna Munro, CRNA ?Pre-anesthesia Checklist: Emergency Drugs available, Suction available, Patient identified and Patient being monitored ?Patient Re-evaluated:Patient Re-evaluated prior to induction ?Oxygen Delivery Method: Simple face mask ?Preoxygenation: Pre-oxygenation with 100% oxygen ?Induction Type: IV induction ?Placement Confirmation: positive ETCO2 and breath sounds checked- equal and bilateral ?Dental Injury: Teeth and Oropharynx as per pre-operative assessment  ? ? ? ? ?

## 2022-02-13 NOTE — Discharge Instructions (Signed)
YOU HAD AN ENDOSCOPIC PROCEDURE TODAY: Refer to the procedure report and other information in the discharge instructions given to you for any specific questions about what was found during the examination. If this information does not answer your questions, please call [place] office at [phone] to clarify.  ° °YOU SHOULD EXPECT: Some feelings of bloating in the abdomen. Passage of more gas than usual. Walking can help get rid of the air that was put into your GI tract during the procedure and reduce the bloating. If you had a lower endoscopy (such as a colonoscopy or flexible sigmoidoscopy) you may notice spotting of blood in your stool or on the toilet paper. Some abdominal soreness may be present for a day or two, also. ° °DIET: Your first meal following the procedure should be a light meal and then it is ok to progress to your normal diet. A half-sandwich or bowl of soup is an example of a good first meal. Heavy or fried foods are harder to digest and may make you feel nauseous or bloated. Drink plenty of fluids but you should avoid alcoholic beverages for 24 hours. If you had a esophageal dilation, please see attached instructions for diet.   ° °ACTIVITY: Your care partner should take you home directly after the procedure. You should plan to take it easy, moving slowly for the rest of the day. You can resume normal activity the day after the procedure however YOU SHOULD NOT DRIVE, use power tools, machinery or perform tasks that involve climbing or major physical exertion for 24 hours (because of the sedation medicines used during the test).  ° °SYMPTOMS TO REPORT IMMEDIATELY: °A gastroenterologist can be reached at any hour. Please call [phone]  for any of the following symptoms:  °Following lower endoscopy (colonoscopy, flexible sigmoidoscopy) °Excessive amounts of blood in the stool  °Significant tenderness, worsening of abdominal pains  °Swelling of the abdomen that is new, acute  °Fever of 100° or  higher  °Following upper endoscopy (EGD, EUS, ERCP, esophageal dilation) °Vomiting of blood or coffee ground material  °New, significant abdominal pain  °New, significant chest pain or pain under the shoulder blades  °Painful or persistently difficult swallowing  °New shortness of breath  °Black, tarry-looking or red, bloody stools ° °FOLLOW UP:  °If any biopsies were taken you will be contacted by phone or by letter within the next 1-3 weeks. Call [phone]  if you have not heard about the biopsies in 3 weeks.  °Please also call with any specific questions about appointments or follow up tests. ° °

## 2022-02-13 NOTE — Anesthesia Preprocedure Evaluation (Signed)
Anesthesia Evaluation  ?Patient identified by MRN, date of birth, ID band ?Patient awake ? ? ? ?Reviewed: ?Allergy & Precautions, NPO status , Patient's Chart, lab work & pertinent test results ? ?Airway ?Mallampati: III ? ?TM Distance: <3 FB ?Neck ROM: Full ? ? ? Dental ?no notable dental hx. ? ?  ?Pulmonary ?neg pulmonary ROS, Current Smoker and Patient abstained from smoking.,  ?  ?breath sounds clear to auscultation ?+ decreased breath sounds ? ? ? ? ? Cardiovascular ?hypertension, Normal cardiovascular exam ?Rhythm:Regular Rate:Normal ? ?Untreated HTN ?  ?Neuro/Psych ?negative neurological ROS ? negative psych ROS  ? GI/Hepatic ?negative GI ROS, Neg liver ROS,   ?Endo/Other  ?Hypothyroidism Morbid obesity ? Renal/GU ?negative Renal ROS  ?negative genitourinary ?  ?Musculoskeletal ?negative musculoskeletal ROS ?(+)  ? Abdominal ?(+) + obese,   ?Peds ?negative pediatric ROS ?(+)  Hematology ?negative hematology ROS ?(+)   ?Anesthesia Other Findings ? ? Reproductive/Obstetrics ?negative OB ROS ? ?  ? ? ? ? ? ? ? ? ? ? ? ? ? ?  ?  ? ? ? ? ? ? ? ? ?Anesthesia Physical ?Anesthesia Plan ? ?ASA: 3 ? ?Anesthesia Plan: MAC  ? ?Post-op Pain Management: Minimal or no pain anticipated  ? ?Induction: Intravenous ? ?PONV Risk Score and Plan: 2 and Propofol infusion and Treatment may vary due to age or medical condition ? ?Airway Management Planned: Simple Face Mask ? ?Additional Equipment:  ? ?Intra-op Plan:  ? ?Post-operative Plan:  ? ?Informed Consent: I have reviewed the patients History and Physical, chart, labs and discussed the procedure including the risks, benefits and alternatives for the proposed anesthesia with the patient or authorized representative who has indicated his/her understanding and acceptance.  ? ? ? ?Dental advisory given ? ?Plan Discussed with: CRNA and Surgeon ? ?Anesthesia Plan Comments:   ? ? ? ? ? ? ?Anesthesia Quick Evaluation ? ?

## 2022-02-14 ENCOUNTER — Encounter (HOSPITAL_COMMUNITY): Payer: Self-pay | Admitting: Gastroenterology

## 2022-02-21 NOTE — Progress Notes (Addendum)
COVID Vaccine Completed:  No ? ?Date of COVID positive in last 90 days:  No ?  ?PCP - Bertram Denver, NP ?Cardiologist -  [facility] (last OV 2018) ?  ?Chest x-ray - 02-03-22 Epic ?EKG - 09-12-21 Epic ?Stress Test - N/A ?ECHO - 09-14-21 Epic ?Cardiac Cath - greater than 2 years Epic ?Pacemaker/ICD device last checked: ?Spinal Cord Stimulator: ?  ?Bowel Prep - Patient has prep and instructions ?  ?Sleep Study - N/A ?CPAP -  ?  ?Fasting Blood Sugar - N/A ?Checks Blood Sugar _____ times a day ?  ?Blood Thinner Instructions:N/A ?Aspirin Instructions: ?Last Dose: ?  ?Activity level:   Can go up a flight of stairs and perform activities of daily living without stopping and without symptoms of chest pain or shortness of breath.                                          ?  ?Anesthesia review:  Hx of chronic chest pain.  Patient denies recent episodes of chest pain. ?  ?Patient denies shortness of breath, fever, cough and chest pain at PAT appointment ?  ?  ?Patient verbalized understanding of instructions that were given to them at the PAT appointment. Patient was also instructed that they will need to review over the PAT instructions again at home before surgery.  ?

## 2022-02-21 NOTE — Patient Instructions (Addendum)
DUE TO COVID-19 ONLY TWO VISITORS  (aged 37 and older)  IS ALLOWED TO COME WITH YOU AND STAY IN THE WAITING ROOM ONLY DURING PRE OP AND PROCEDURE.   ?**NO VISITORS ARE ALLOWED IN THE SHORT STAY AREA OR RECOVERY ROOM!!** ? ?IF YOU WILL BE ADMITTED INTO THE HOSPITAL YOU ARE ALLOWED ONLY FOUR SUPPORT PEOPLE DURING VISITATION HOURS ONLY (7 AM -8PM)   ?The support person(s) must pass our screening, gel in and out ?Visitors GUEST BADGE MUST BE WORN VISIBLY  ?One adult visitor may remain with you overnight and MUST be in the room by 8 P.M.  ? ?You are not required to quarantine ?Hand Hygiene often ?Do NOT share personal items ?Notify your provider if you are in close contact with someone who has COVID or you develop fever 100.4 or greater, new onset of sneezing, cough, sore throat, shortness of breath or body aches. ? ?     ? Your procedure is scheduled on:  02-27-22 ? ? Report to [facility] Main Entrance ? ?  Report to admitting at 8:45 AM ? ? Call this number if you have problems the morning of surgery [phone] ?  ? Clear liquid diet day of prep to prevent hydration ? ? After Midnight you may have the following liquids until 8:00 AM DAY OF SURGERY ? ?Water ?Black Coffee (sugar ok, NO MILK/CREAM OR CREAMERS)  ?Tea (sugar ok, NO MILK/CREAM OR CREAMERS) regular and decaf                             ?Plain Jell-O (NO RED)                                           ?Fruit ices (not with fruit pulp, NO RED)                                     ?Popsicles (NO RED)                                                                  ?Juice: apple, WHITE grape, WHITE cranberry ?Sports drinks like Gatorade (NO RED) ?Clear broth(vegetable,chicken,beef) ? ? ?Drink two Ensure Pre Surgery drinks the night of surgery, complete by 10 PM ?             ?  ?  ?The day of surgery:  ?Drink ONE (1) Pre-Surgery Clear Ensure at 8:00 AM the morning of surgery. Drink in one sitting. Do not sip.  ?This drink was given to you during your  hospital  ?pre-op appointment visit. ?Nothing else to drink after completing the Pre-Surgery Clear Ensure. ?  ?       If you have questions, please contact your surgeon?s office. ? ? ?FOLLOW BOWEL PREP AND ANY ADDITIONAL PRE OP INSTRUCTIONS YOU RECEIVED FROM YOUR SURGEON'S OFFICE!!! ?  ?Bisacodyl (Dulcolax) 20 mg - Give with water the day prior to surgery. ? ?Miralax 255g -  Mix with 64 oz Gatorade/Powerade.  Drink gradually over the next few  hours (8 oz glass every 15-30 minutes) until gone the day prior to surgery. ?  ?Metronidazole 1000 mg - At 2 pm, 3 pm and 10 pm after Miralax bowel prep the day prior to surgery. ? ?Neomycin 1000 mg -  At 2 pm, 3 pm and 10 pm after Miralax bowel prep the day prior to surgery ? ?  ?Oral Hygiene is also important to reduce your risk of infection.                                    ?Remember - BRUSH YOUR TEETH THE MORNING OF SURGERY WITH YOUR REGULAR TOOTHPASTE ? ? Do NOT smoke after Midnight ? ? Take these medicines the morning of surgery with A SIP OF WATER:  Albuterol inhaler (bring with you day of surgery) ?                 ?           You may not have any metal on your body including hair pins, jewelry, and body piercing ? ?           Do not wear make-up, lotions, powders, perfumes or deodorant ? ?Do not wear nail polish including gel and S&S, artificial/acrylic nails, or any other type of covering on natural nails including finger and toenails. If you have artificial nails, gel coating, etc. that needs to be removed by a nail salon please have this removed prior to surgery or surgery may need to be canceled/ delayed if the surgeon/ anesthesia feels like they are unable to be safely monitored.  ? ?Do not shave  48 hours prior to surgery.  ? ? Do not bring valuables to the hospital. [facility] IS NOT RESPONSIBLE   FOR VALUABLES. ? ? Contacts, dentures or bridgework may not be worn into surgery. ? ? Bring small overnight bag day of surgery. ? ?Please read over the following  fact sheets you were given: IF YOU HAVE QUESTIONS ABOUT YOUR PRE-OP INSTRUCTIONS PLEASE CALL [phone] Gwen  ? ?[facility] - Preparing for Surgery ?Before surgery, you can play an important role.  Because skin is not sterile, your skin needs to be as free of germs as possible.  You can reduce the number of germs on your skin by washing with CHG (chlorahexidine gluconate) soap before surgery.  CHG is an antiseptic cleaner which kills germs and bonds with the skin to continue killing germs even after washing. ?Please DO NOT use if you have an allergy to CHG or antibacterial soaps.  If your skin becomes reddened/irritated stop using the CHG and inform your nurse when you arrive at Short Stay. ?Do not shave (including legs and underarms) for at least 48 hours prior to the first CHG shower.  You may shave your face/neck. ? ?Please follow these instructions carefully: ? 1.  Shower with CHG Soap the night before surgery and the  morning of surgery. ? 2.  If you choose to wash your hair, wash your hair first as usual with your normal  shampoo. ? 3.  After you shampoo, rinse your hair and body thoroughly to remove the shampoo.                            ? 4.  Use CHG as you would any other liquid soap.  You can apply chg directly to  the skin and wash.  Gently with a scrungie or clean washcloth. ? 5.  Apply the CHG Soap to your body ONLY FROM THE NECK DOWN.   Do   not use on face/ open      ?                     Wound or open sores. Avoid contact with eyes, ears mouth and   genitals (private parts).  ?                     Engineering geologist,  Genitals (private parts) with your normal soap. ?            6.  Wash thoroughly, paying special attention to the area where your    surgery  will be performed. ? 7.  Thoroughly rinse your body with warm water from the neck down. ? 8.  DO NOT shower/wash with your normal soap after using and rinsing off the CHG Soap. ?               9.  Pat yourself dry with a clean towel. ?           10.   Wear clean pajamas. ?           11.  Place clean sheets on your bed the night of your first shower and do not  sleep with pets. ?Day of Surgery : ?Do not apply any lotions/deodorants the morning of surgery.  Please wear clean clothes to the hospital/surgery center. ? ?FAILURE TO FOLLOW THESE INSTRUCTIONS MAY RESULT IN THE CANCELLATION OF YOUR SURGERY ? ?PATIENT SIGNATURE_________________________________ ? ?NURSE SIGNATURE__________________________________ ? ?________________________________________________________________________  ? ? ? ?Incentive Spirometer ? ?An incentive spirometer is a tool that can help keep your lungs clear and active. This tool measures how well you are filling your lungs with each breath. Taking long deep breaths may help reverse or decrease the chance of developing breathing (pulmonary) problems (especially infection) following: ?A long period of time when you are unable to move or be active. ?BEFORE THE PROCEDURE  ?If the spirometer includes an indicator to show your best effort, your nurse or respiratory therapist will set it to a desired goal. ?If possible, sit up straight or lean slightly forward. Try not to slouch. ?Hold the incentive spirometer in an upright position. ?INSTRUCTIONS FOR USE  ?Sit on the edge of your bed if possible, or sit up as far as you can in bed or on a chair. ?Hold the incentive spirometer in an upright position. ?Breathe out normally. ?Place the mouthpiece in your mouth and seal your lips tightly around it. ?Breathe in slowly and as deeply as possible, raising the piston or the ball toward the top of the column. ?Hold your breath for 3-5 seconds or for as long as possible. Allow the piston or ball to fall to the bottom of the column. ?Remove the mouthpiece from your mouth and breathe out normally. ?Rest for a few seconds and repeat Steps 1 through 7 at least 10 times every 1-2 hours when you are awake. Take your time and take a few normal breaths between deep  breaths. ?The spirometer may include an indicator to show your best effort. Use the indicator as a goal to work toward during each repetition. ?After each set of 10 deep breaths, practice coughing to be sure y

## 2022-02-24 ENCOUNTER — Ambulatory Visit: Payer: Medicaid Other | Admitting: Family Medicine

## 2022-02-25 ENCOUNTER — Ambulatory Visit
Admission: EM | Admit: 2022-02-25 | Discharge: 2022-02-25 | Disposition: A | Discharge status: Home / Self Care | Payer: Medicaid Other | Attending: Emergency Medicine | Admitting: Emergency Medicine

## 2022-02-25 ENCOUNTER — Encounter: Payer: Self-pay | Admitting: Emergency Medicine

## 2022-02-25 DIAGNOSIS — K0889 Other specified disorders of teeth and supporting structures: Secondary | ICD-10-CM

## 2022-02-25 MED ORDER — IBUPROFEN 400 MG PO TABS
400.0000 mg | ORAL_TABLET | Freq: Once | ORAL | Status: AC
  Administered 2022-02-25: 400 mg via ORAL

## 2022-02-25 NOTE — ED Provider Notes (Signed)
?[facility] ? ? ? ?CSN: [id number] ?Arrival date & time: 02/25/22  1526 ? ?  ? ?History   ?Chief Complaint ?Chief Complaint  ?Patient presents with  ? Dental Pain  ? ? ?HPI ?Lori Mcknight is a 36 y.o. female.  ? ?5/10 pain in mouth came on last night in the left upper molar. Sore when moves mouth. No fever, chills, sore throat, shortness of breath, trouble breathing, no drainage, no bleeding. No swelling of face or jaw. Ibuprofen last night helped with pain. March 2023 had dental infection that developed into abscess, was then drained in ED, was given Amoxicillin for this. Has not been able to follow up with dentist since this episode. Now has insurance card and is able to schedule with dentist.  ? ?Past Medical History:  ?Diagnosis Date  ? Acute diverticulitis 08/07/2019  ? Anemia   ? history of anemia  ? Anxiety   ? Asthma   ? inhaler as needed  ? Chest pain of uncertain etiology   ? intermittent left side chest pain  ? Chlamydia   ? Depression   ? Diverticulitis 2019  ? Diverticulitis of intestine with abscess 07/13/2021  ? Endometriosis   ? Genital herpes   ? Headache   ? MIGRAINES  ? Hypertension   ? no meds since April  ? Hypothyroidism   ? Obese   ? Obesity, Class III, BMI 40-49.9 (morbid obesity) (HCC) 11/06/2011  ? PCOS (polycystic ovarian syndrome)   ? PONV (postoperative nausea and vomiting)   ? Shortness of breath   ? on exertion from weight  ? ? ?Patient Active Problem List  ? Diagnosis Date Noted  ? Hypomagnesemia 09/18/2021  ? Fecal peritonitis (HCC) 09/18/2021  ? Intra-abdominal abscess s/p robotic drainage 09/18/2021 09/18/2021  ? Colostomy in place [facility](HCC) 09/18/2021  ? Generalized abdominal pain 05/09/2021  ? 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 05/09/2021  ? Hypokalemia 05/09/2021  ? Asthma, mild intermittent 05/09/2021  ? Diverticulitis 03/26/2020  ? Severe obesity (BMI >= 40) (HCC) 08/08/2019  ? Endometriosis s/p ablation 08/08/2019  ? Dysuria 08/08/2019   ? Family history of colon cancer in father 08/08/2019  ? Incomplete bladder emptying 02/18/2018  ? DUB (dysfunctional uterine bleeding) 02/18/2018  ? Anemia 02/18/2018  ? Hypothyroid 01/29/2017  ? Migraines 08/26/2016  ? Chest pain 02/04/2016  ? Abnormal uterine bleeding (AUB) 01/09/2014  ? Thickened endometrium 12/02/2013  ? Essential hypertension 10/21/2013  ? Infertility, female 11/06/2011  ? Pelvic pain in female 08/01/2011  ? ? ?Past Surgical History:  ?Procedure Laterality Date  ? COLONOSCOPY WITH PROPOFOL N/A 02/13/2022  ? Procedure: COLONOSCOPY WITH PROPOFOL;  Surgeon: Rachael FeeJacobs, Daniel P, MD;  Location[facility];  Service: Gastroenterology;  Laterality: N/A;  ? DILATION AND CURETTAGE OF UTERUS N/A 04/29/2018  ? Procedure: DILATATION AND CURETTAGE;  Surgeon: Allie Bossierove, Myra C, MD;  Location: [facility];  Service: Gynecology;  Laterality: N/A;  ? HYSTEROSCOPY WITH D & C N/A 04/06/2014  ? Procedure: DILATATION AND CURETTAGE /HYSTEROSCOPY ;  Surgeon: Tereso NewcomerUgonna A Anyanwu, MD;  Location: [facility];  Service: Gynecology;  Laterality: N/A;  ? LAPAROSCOPY N/A 04/29/2018  ? Procedure: LAPAROSCOPY DIAGNOSTIC WITH PERITONEAL BIOPSY;  Surgeon: Allie Bossierove, Myra C, MD;  Location: [facility];  Service: Gynecology;  Laterality: N/A;  ? LEFT HEART CATH AND CORONARY ANGIOGRAPHY N/A 12/12/2016  ? Procedure: Left Heart Cath and Coronary Angiography;  Surgeon: Peter M SwazilandJordan, MD;  [facility];  Service: Cardiovascular;  Laterality:  N/A;  ? WISDOM TOOTH EXTRACTION    ? ? ?OB History   ? ? Gravida  ?0  ? Para  ?0  ? Term  ?0  ? Preterm  ?0  ? AB  ?0  ? Living  ?0  ?  ? ? SAB  ?0  ? IAB  ?0  ? Ectopic  ?0  ? Multiple  ?0  ? Live Births  ?0  ?   ?  ?  ? ? ? ?Home Medications   ? ?Prior to Admission medications   ?Medication Sig Start Date End Date Taking? Authorizing Provider  ?albuterol (VENTOLIN HFA) 108 (90 Base) MCG/ACT inhaler Inhale 2 puffs into the lungs every 6 (six) hours as needed for wheezing or shortness of breath. 05/22/21   Claiborne Rigg, NP  ?amoxicillin (AMOXIL) 500 MG capsule Take 1 capsule (500 mg total) by mouth 3 (three) times daily. ?Patient not taking: Reported on 02/03/2022 12/24/21   Tomi Bamberger, PA-C  ?cyclobenzaprine (FLEXERIL) 10 MG tablet Take 1 tablet (10 mg total) by mouth at bedtime. ?Patient not taking: Reported on 07/14/2021 05/06/21   Claiborne Rigg, NP  ?gabapentin (NEURONTIN) 300 MG capsule Take 1 capsule (300 mg total) by mouth 3 (three) times daily. Increase to 4x/day as needed ?Patient not taking: Reported on 02/10/2022 09/23/21   Karie Soda, MD  ?levothyroxine (SYNTHROID) 88 MCG tablet Take 1 tablet (88 mcg total) by mouth daily before breakfast. ?Patient not taking: Reported on 02/03/2022 09/24/21   Rodolph Bong, MD  ?naproxen (NAPROSYN) 500 MG tablet Take 1 tablet (500 mg total) by mouth 2 (two) times daily with a meal. ?Patient not taking: Reported on 02/10/2022 02/03/22   Wallis Bamberg, PA-C  ?polyethylene glycol (MIRALAX / GLYCOLAX) 17 g packet Take 17 g by mouth 2 (two) times daily as needed for moderate constipation or severe constipation. ?Patient not taking: Reported on 02/10/2022 07/17/21   Eric Form, PA-C  ?tiZANidine (ZANAFLEX) 4 MG tablet Take 1 tablet (4 mg total) by mouth at bedtime. ?Patient not taking: Reported on 02/10/2022 02/03/22   Wallis Bamberg, PA-C  ? ? ?Family History ?Family History  ?Problem Relation Age of Onset  ? Heart disease Mother   ? Sleep apnea Mother   ? Depression Mother   ? Hypertension Mother   ? Kidney disease Mother   ? Cancer Father   ? Diabetes Father   ? Heart disease Father   ? Colon cancer Father   ?     50s  ? Hypertension Father   ? Diabetes Sister   ? Hypertension Sister   ? Heart disease Maternal Grandmother   ? Esophageal cancer Maternal Grandmother   ? Diabetes Maternal Grandfather   ? Heart disease Maternal Grandfather   ? Colon cancer Cousin   ?     died age 44, paternal cousin  ? Stomach cancer Neg Hx   ? Colon polyps Neg Hx   ? ? ?Social History ?Social  History  ? ?Tobacco Use  ? Smoking status: Some Days  ?  Packs/day: 0.25  ?  Types: Cigarettes  ? Smokeless tobacco: Never  ? Tobacco comments:  ?  2-3 cigerettes a week.  ?Vaping Use  ? Vaping Use: Some days  ? Substances: Nicotine, Flavoring  ?Substance Use Topics  ? Alcohol use: Not Currently  ?  Alcohol/week: 0.0 standard drinks  ?  Comment: occasonal   ? Drug use: Not Currently  ?  Types: Marijuana  ?  Comment: last use 3 months ago  ? ? ? ?Allergies   ?Dilaudid [hydromorphone] ? ? ?Review of Systems ?Review of Systems ?As per HPI ? ?Physical Exam ?Triage Vital Signs ?ED Triage Vitals  ?Enc Vitals Group  ?   BP 02/25/22 1616 (!) 136/98  ?   Pulse Rate 02/25/22 1616 88  ?   Resp 02/25/22 1616 16  ?   Temp 02/25/22 1616 97.9 ?F (36.6 ?C)  ?   Temp Source 02/25/22 1616 Oral  ?   SpO2 02/25/22 1616 97 %  ?   Weight --   ?   Height --   ?   Head Circumference --   ?   Peak Flow --   ?   Pain Score 02/25/22 1614 5  ?   Pain Loc --   ?   Pain Edu? --   ?   Excl. in GC? --   ? ?No data found. ? ?Updated Vital Signs ?BP (!) 136/98 (BP Location: Right Arm)   Pulse 88   Temp 97.9 ?F (36.6 ?C) (Oral)   Resp 16   SpO2 97%  ? ?Physical Exam ?Vitals and nursing note reviewed.  ?Constitutional:   ?   General: She is not in acute distress. ?   Appearance: She is well-developed.  ?HENT:  ?   Mouth/Throat:  ?   Mouth: Mucous membranes are moist.  ?   Dentition: Dental tenderness and dental caries present. No gingival swelling or dental abscesses.  ?   Pharynx: Oropharynx is clear.  ? ?   Comments: Pain with opening mouth for exam. Area of erythema to upper gums. No abscess or swelling noted. Dental carries in the area. Mouth and throat are clear. No facial swelling, no jaw tenderness. No LAD ?Eyes:  ?   Conjunctiva/sclera: Conjunctivae normal.  ?Cardiovascular:  ?   Rate and Rhythm: Normal rate and regular rhythm.  ?   Heart sounds: Normal heart sounds.  ?Pulmonary:  ?   Effort: Pulmonary effort is normal. No respiratory  distress.  ?   Breath sounds: Normal breath sounds.  ?Musculoskeletal:  ?   Cervical back: Neck supple.  ?Neurological:  ?   Mental Status: She is alert.  ?Psychiatric:     ?   Mood and Affect: Mood normal.  ? ?

## 2022-02-25 NOTE — ED Triage Notes (Signed)
Pt said has previous dental issue on the left upper tooth and last night began to hurt and swell in the gum area. Hard to chew and eat on the left side.  ?

## 2022-02-25 NOTE — Discharge Instructions (Signed)
Please follow up with a dentist as soon as possible. ?You can continue ibuprofen every 4-6 hours for pain. ? ?Please return to the urgent care or emergency department if symptoms worsen or do not improve. ?

## 2022-02-26 ENCOUNTER — Other Ambulatory Visit: Payer: Self-pay

## 2022-02-26 ENCOUNTER — Encounter (HOSPITAL_COMMUNITY)
Admission: RE | Admit: 2022-02-26 | Discharge: 2022-02-26 | Disposition: A | Discharge status: Home / Self Care | Payer: Medicaid Other | Source: Ambulatory Visit | Attending: Surgery | Admitting: Surgery

## 2022-02-26 ENCOUNTER — Encounter (HOSPITAL_COMMUNITY): Payer: Self-pay

## 2022-02-26 DIAGNOSIS — D649 Anemia, unspecified: Secondary | ICD-10-CM | POA: Insufficient documentation

## 2022-02-26 DIAGNOSIS — I2510 Atherosclerotic heart disease of native coronary artery without angina pectoris: Secondary | ICD-10-CM | POA: Insufficient documentation

## 2022-02-26 DIAGNOSIS — R739 Hyperglycemia, unspecified: Secondary | ICD-10-CM | POA: Insufficient documentation

## 2022-02-26 DIAGNOSIS — Z01812 Encounter for preprocedural laboratory examination: Secondary | ICD-10-CM | POA: Insufficient documentation

## 2022-02-26 LAB — HEMOGLOBIN A1C
Hgb A1c MFr Bld: 5.3 % (ref 4.8–5.6)
Mean Plasma Glucose: 105.41 mg/dL

## 2022-02-26 LAB — BASIC METABOLIC PANEL
Anion gap: 9 (ref 5–15)
BUN: 14 mg/dL (ref 6–20)
CO2: 22 mmol/L (ref 22–32)
Calcium: 8.9 mg/dL (ref 8.9–10.3)
Chloride: 109 mmol/L (ref 98–111)
Creatinine, Ser: 0.69 mg/dL (ref 0.44–1.00)
GFR, Estimated: >60 mL/min (ref >60)
Glucose, Bld: 106 mg/dL — ABNORMAL HIGH (ref 70–99)
Potassium: 3.5 mmol/L (ref 3.5–5.1)
Sodium: 140 mmol/L (ref 135–145)

## 2022-02-26 LAB — CBC
HCT: 45.3 % (ref 36.0–46.0)
Hemoglobin: 14.6 g/dL (ref 12.0–15.0)
MCH: 31.5 pg (ref 26.0–34.0)
MCHC: 32.2 g/dL (ref 30.0–36.0)
MCV: 97.6 fL (ref 80.0–100.0)
Platelets: 180 10*3/uL (ref 150–400)
RBC: 4.64 MIL/uL (ref 3.87–5.11)
RDW: 14.1 % (ref 11.5–15.5)
WBC: 5.2 10*3/uL (ref 4.0–10.5)
nRBC: 0 % (ref 0.0–0.2)

## 2022-02-26 NOTE — Progress Notes (Signed)
Baribed requested with portable equipment for surgery on 02-27-22. ?

## 2022-02-27 ENCOUNTER — Inpatient Hospital Stay (HOSPITAL_COMMUNITY): Payer: Self-pay | Admitting: Physician Assistant

## 2022-02-27 ENCOUNTER — Encounter (HOSPITAL_COMMUNITY)
Admission: RE | Disposition: A | Discharge status: Home / Self Care | Payer: Self-pay | Source: Home / Self Care | Attending: Surgery

## 2022-02-27 ENCOUNTER — Other Ambulatory Visit: Payer: Self-pay

## 2022-02-27 ENCOUNTER — Inpatient Hospital Stay (HOSPITAL_COMMUNITY): Payer: Self-pay | Admitting: Anesthesiology

## 2022-02-27 ENCOUNTER — Inpatient Hospital Stay (HOSPITAL_COMMUNITY)
Admission: RE | Admit: 2022-02-27 | Discharge: 2022-03-02 | DRG: 330 | Disposition: A | Discharge status: Home / Self Care | Payer: Self-pay | Attending: Surgery | Admitting: Surgery

## 2022-02-27 ENCOUNTER — Encounter (HOSPITAL_COMMUNITY): Payer: Self-pay | Admitting: Surgery

## 2022-02-27 DIAGNOSIS — Z800 Family history of malignant neoplasm of digestive organs: Secondary | ICD-10-CM

## 2022-02-27 DIAGNOSIS — K5780 Diverticulitis of intestine, part unspecified, with perforation and abscess without bleeding: Secondary | ICD-10-CM

## 2022-02-27 DIAGNOSIS — Z433 Encounter for attention to colostomy: Principal | ICD-10-CM

## 2022-02-27 DIAGNOSIS — Z8249 Family history of ischemic heart disease and other diseases of the circulatory system: Secondary | ICD-10-CM

## 2022-02-27 DIAGNOSIS — E039 Hypothyroidism, unspecified: Secondary | ICD-10-CM

## 2022-02-27 DIAGNOSIS — K122 Cellulitis and abscess of mouth: Secondary | ICD-10-CM

## 2022-02-27 DIAGNOSIS — K5720 Diverticulitis of large intestine with perforation and abscess without bleeding: Secondary | ICD-10-CM | POA: Diagnosis present

## 2022-02-27 DIAGNOSIS — I10 Essential (primary) hypertension: Secondary | ICD-10-CM | POA: Diagnosis present

## 2022-02-27 DIAGNOSIS — Z841 Family history of disorders of kidney and ureter: Secondary | ICD-10-CM

## 2022-02-27 DIAGNOSIS — Z9049 Acquired absence of other specified parts of digestive tract: Secondary | ICD-10-CM

## 2022-02-27 DIAGNOSIS — F418 Other specified anxiety disorders: Secondary | ICD-10-CM

## 2022-02-27 DIAGNOSIS — K5909 Other constipation: Secondary | ICD-10-CM | POA: Diagnosis present

## 2022-02-27 DIAGNOSIS — Z5986 Financial insecurity: Secondary | ICD-10-CM

## 2022-02-27 DIAGNOSIS — Z6841 Body Mass Index (BMI) 40.0 and over, adult: Secondary | ICD-10-CM

## 2022-02-27 DIAGNOSIS — K660 Peritoneal adhesions (postprocedural) (postinfection): Secondary | ICD-10-CM | POA: Diagnosis present

## 2022-02-27 DIAGNOSIS — K435 Parastomal hernia without obstruction or  gangrene: Secondary | ICD-10-CM | POA: Diagnosis present

## 2022-02-27 DIAGNOSIS — Z8719 Personal history of other diseases of the digestive system: Secondary | ICD-10-CM

## 2022-02-27 DIAGNOSIS — I2510 Atherosclerotic heart disease of native coronary artery without angina pectoris: Secondary | ICD-10-CM

## 2022-02-27 DIAGNOSIS — N736 Female pelvic peritoneal adhesions (postinfective): Secondary | ICD-10-CM | POA: Diagnosis present

## 2022-02-27 DIAGNOSIS — D649 Anemia, unspecified: Secondary | ICD-10-CM

## 2022-02-27 DIAGNOSIS — K5732 Diverticulitis of large intestine without perforation or abscess without bleeding: Secondary | ICD-10-CM | POA: Diagnosis present

## 2022-02-27 DIAGNOSIS — K644 Residual hemorrhoidal skin tags: Secondary | ICD-10-CM | POA: Diagnosis present

## 2022-02-27 DIAGNOSIS — Z01818 Encounter for other preprocedural examination: Principal | ICD-10-CM

## 2022-02-27 DIAGNOSIS — Z833 Family history of diabetes mellitus: Secondary | ICD-10-CM

## 2022-02-27 DIAGNOSIS — K047 Periapical abscess without sinus: Secondary | ICD-10-CM | POA: Diagnosis present

## 2022-02-27 HISTORY — PX: XI ROBOTIC ASSISTED COLOSTOMY TAKEDOWN: SHX6828

## 2022-02-27 HISTORY — PX: PROCTOSCOPY: SHX2266

## 2022-02-27 HISTORY — PX: LYSIS OF ADHESION: SHX5961

## 2022-02-27 LAB — PREGNANCY, URINE: Preg Test, Ur: NEGATIVE

## 2022-02-27 SURGERY — CLOSURE, COLOSTOMY, ROBOT-ASSISTED
Anesthesia: General

## 2022-02-27 MED ORDER — ONDANSETRON HCL 4 MG/2ML IJ SOLN
INTRAMUSCULAR | Status: DC | PRN
Start: 2022-02-27 — End: 2022-02-27
  Administered 2022-02-27: 4 mg via INTRAVENOUS

## 2022-02-27 MED ORDER — LACTATED RINGERS IV SOLN: INTRAVENOUS | Status: DC

## 2022-02-27 MED ORDER — NEOMYCIN SULFATE 500 MG PO TABS: 1000.0000 mg | ORAL_TABLET | ORAL | Status: DC

## 2022-02-27 MED ORDER — ONDANSETRON HCL 4 MG/2ML IJ SOLN: 4.0000 mg | Freq: Once | INTRAMUSCULAR | Status: DC | PRN

## 2022-02-27 MED ORDER — TIZANIDINE HCL 4 MG PO TABS
4.0000 mg | ORAL_TABLET | Freq: Every day | ORAL | Status: DC
  Administered 2022-02-27 – 2022-03-01 (×3): 4 mg via ORAL
  Filled 2022-02-27 (×3): qty 1

## 2022-02-27 MED ORDER — LIDOCAINE HCL (PF) 2 % IJ SOLN
INTRAMUSCULAR | Status: DC | PRN
  Administered 2022-02-27: 1.5 mg/kg/h via INTRADERMAL

## 2022-02-27 MED ORDER — HYDROMORPHONE HCL 1 MG/ML IJ SOLN: 0.5000 mg | INTRAMUSCULAR | Status: DC | PRN

## 2022-02-27 MED ORDER — ENSURE PRE-SURGERY PO LIQD: 592.0000 mL | Freq: Once | ORAL | Status: DC

## 2022-02-27 MED ORDER — CALCIUM POLYCARBOPHIL 625 MG PO TABS
625.0000 mg | ORAL_TABLET | Freq: Two times a day (BID) | ORAL | Status: DC
  Administered 2022-02-27 – 2022-03-01 (×5): 625 mg via ORAL
  Filled 2022-02-27 (×6): qty 1

## 2022-02-27 MED ORDER — BUPIVACAINE-EPINEPHRINE (PF) 0.25% -1:200000 IJ SOLN
INTRAMUSCULAR | Status: AC
  Filled 2022-02-27: qty 30

## 2022-02-27 MED ORDER — LABETALOL HCL 5 MG/ML IV SOLN
INTRAVENOUS | Status: DC | PRN
  Administered 2022-02-27 (×2): 10 mg via INTRAVENOUS

## 2022-02-27 MED ORDER — SCOPOLAMINE 1 MG/3DAYS TD PT72
MEDICATED_PATCH | TRANSDERMAL | Status: AC
  Filled 2022-02-27: qty 1

## 2022-02-27 MED ORDER — ROCURONIUM BROMIDE 100 MG/10ML IV SOLN
INTRAVENOUS | Status: DC | PRN
  Administered 2022-02-27 (×2): 30 mg via INTRAVENOUS
  Administered 2022-02-27: 40 mg via INTRAVENOUS
  Administered 2022-02-27: 100 mg via INTRAVENOUS

## 2022-02-27 MED ORDER — BUPIVACAINE LIPOSOME 1.3 % IJ SUSP: 20.0000 mL | Freq: Once | INTRAMUSCULAR | Status: DC

## 2022-02-27 MED ORDER — LIP MEDEX EX OINT
1.0000 "application " | TOPICAL_OINTMENT | Freq: Two times a day (BID) | CUTANEOUS | Status: DC
  Administered 2022-02-27 – 2022-03-02 (×6): 1 via TOPICAL
  Filled 2022-02-27 (×2): qty 7

## 2022-02-27 MED ORDER — SODIUM CHLORIDE 0.9 % IV SOLN
2.0000 g | INTRAVENOUS | Status: AC
  Administered 2022-02-27: 2 g via INTRAVENOUS
  Filled 2022-02-27: qty 2

## 2022-02-27 MED ORDER — MORPHINE SULFATE (PF) 2 MG/ML IV SOLN
INTRAVENOUS | Status: AC
  Filled 2022-02-27: qty 1

## 2022-02-27 MED ORDER — AMISULPRIDE (ANTIEMETIC) 5 MG/2ML IV SOLN: 10.0000 mg | Freq: Once | INTRAVENOUS | Status: DC | PRN

## 2022-02-27 MED ORDER — LACTATED RINGERS IV SOLN: INTRAVENOUS | Status: AC

## 2022-02-27 MED ORDER — GABAPENTIN 100 MG PO CAPS
200.0000 mg | ORAL_CAPSULE | Freq: Three times a day (TID) | ORAL | Status: DC
  Administered 2022-02-27 (×2): 200 mg via ORAL
  Filled 2022-02-27 (×2): qty 2

## 2022-02-27 MED ORDER — ENOXAPARIN SODIUM 40 MG/0.4ML IJ SOSY
40.0000 mg | PREFILLED_SYRINGE | Freq: Once | INTRAMUSCULAR | Status: AC
  Administered 2022-02-27: 40 mg via SUBCUTANEOUS
  Filled 2022-02-27: qty 0.4

## 2022-02-27 MED ORDER — ENSURE SURGERY PO LIQD
237.0000 mL | Freq: Two times a day (BID) | ORAL | Status: DC
  Administered 2022-02-28 – 2022-03-02 (×4): 237 mL via ORAL

## 2022-02-27 MED ORDER — LIDOCAINE HCL (PF) 2 % IJ SOLN
INTRAMUSCULAR | Status: AC
  Filled 2022-02-27: qty 10

## 2022-02-27 MED ORDER — LIDOCAINE HCL (CARDIAC) PF 100 MG/5ML IV SOSY
PREFILLED_SYRINGE | INTRAVENOUS | Status: DC | PRN
Start: 2022-02-27 — End: 2022-02-27
  Administered 2022-02-27: 100 mg via INTRAVENOUS

## 2022-02-27 MED ORDER — METOPROLOL TARTRATE 5 MG/5ML IV SOLN: 5.0000 mg | Freq: Four times a day (QID) | INTRAVENOUS | Status: DC | PRN

## 2022-02-27 MED ORDER — PROCHLORPERAZINE MALEATE 10 MG PO TABS: 10.0000 mg | ORAL_TABLET | Freq: Four times a day (QID) | ORAL | Status: DC | PRN

## 2022-02-27 MED ORDER — LEVOTHYROXINE SODIUM 88 MCG PO TABS
88.0000 ug | ORAL_TABLET | Freq: Every day | ORAL | Status: DC
  Administered 2022-02-28 – 2022-03-02 (×3): 88 ug via ORAL
  Filled 2022-02-27 (×3): qty 1

## 2022-02-27 MED ORDER — ORAL CARE MOUTH RINSE: 15.0000 mL | Freq: Once | OROMUCOSAL | Status: AC

## 2022-02-27 MED ORDER — FENTANYL CITRATE (PF) 250 MCG/5ML IJ SOLN
INTRAMUSCULAR | Status: AC
  Filled 2022-02-27: qty 5

## 2022-02-27 MED ORDER — ACETAMINOPHEN 500 MG PO TABS
1000.0000 mg | ORAL_TABLET | Freq: Four times a day (QID) | ORAL | Status: DC
  Administered 2022-02-27 – 2022-03-02 (×11): 1000 mg via ORAL
  Filled 2022-02-27 (×11): qty 2

## 2022-02-27 MED ORDER — LACTATED RINGERS IV BOLUS
1000.0000 mL | Freq: Three times a day (TID) | INTRAVENOUS | Status: AC | PRN
Start: 2022-02-27 — End: 2022-03-01

## 2022-02-27 MED ORDER — SODIUM CHLORIDE 0.9 % IV SOLN
2.0000 g | Freq: Two times a day (BID) | INTRAVENOUS | Status: AC
  Administered 2022-02-27: 2 g via INTRAVENOUS
  Filled 2022-02-27: qty 2

## 2022-02-27 MED ORDER — ACETAMINOPHEN 500 MG PO TABS
1000.0000 mg | ORAL_TABLET | ORAL | Status: AC
  Administered 2022-02-27: 1000 mg via ORAL
  Filled 2022-02-27: qty 2

## 2022-02-27 MED ORDER — DEXMEDETOMIDINE (PRECEDEX) IN NS 20 MCG/5ML (4 MCG/ML) IV SYRINGE
PREFILLED_SYRINGE | INTRAVENOUS | Status: DC | PRN
  Administered 2022-02-27: 4 ug via INTRAVENOUS
  Administered 2022-02-27 (×2): 8 ug via INTRAVENOUS

## 2022-02-27 MED ORDER — TRAMADOL HCL 50 MG PO TABS: 50.0000 mg | ORAL_TABLET | Freq: Four times a day (QID) | ORAL | 0 refills | Status: DC | PRN

## 2022-02-27 MED ORDER — MORPHINE SULFATE (PF) 2 MG/ML IV SOLN
1.0000 mg | INTRAVENOUS | Status: DC | PRN
  Administered 2022-02-27: 1 mg via INTRAVENOUS
  Administered 2022-02-27: 2 mg via INTRAVENOUS
  Administered 2022-02-27: 1 mg via INTRAVENOUS

## 2022-02-27 MED ORDER — ALUM & MAG HYDROXIDE-SIMETH 200-200-20 MG/5ML PO SUSP: 30.0000 mL | Freq: Four times a day (QID) | ORAL | Status: DC | PRN

## 2022-02-27 MED ORDER — BUPIVACAINE-EPINEPHRINE (PF) 0.25% -1:200000 IJ SOLN
INTRAMUSCULAR | Status: DC | PRN
  Administered 2022-02-27: 30 mL

## 2022-02-27 MED ORDER — LACTATED RINGERS IR SOLN
Status: DC | PRN
  Administered 2022-02-27: 3000 mL

## 2022-02-27 MED ORDER — CHLORHEXIDINE GLUCONATE CLOTH 2 % EX PADS
6.0000 | MEDICATED_PAD | Freq: Once | CUTANEOUS | Status: DC
Start: 2022-02-27 — End: 2022-02-27

## 2022-02-27 MED ORDER — ALBUTEROL SULFATE (2.5 MG/3ML) 0.083% IN NEBU: 3.0000 mL | INHALATION_SOLUTION | Freq: Four times a day (QID) | RESPIRATORY_TRACT | Status: DC | PRN

## 2022-02-27 MED ORDER — MAGIC MOUTHWASH: 15.0000 mL | Freq: Four times a day (QID) | ORAL | Status: DC | PRN

## 2022-02-27 MED ORDER — BUPIVACAINE LIPOSOME 1.3 % IJ SUSP
INTRAMUSCULAR | Status: AC
  Filled 2022-02-27: qty 20

## 2022-02-27 MED ORDER — CELECOXIB 200 MG PO CAPS
200.0000 mg | ORAL_CAPSULE | ORAL | Status: AC
Start: 2022-02-27 — End: 2022-02-27
  Administered 2022-02-27: 200 mg via ORAL
  Filled 2022-02-27: qty 1

## 2022-02-27 MED ORDER — ONDANSETRON HCL 4 MG/2ML IJ SOLN
4.0000 mg | Freq: Four times a day (QID) | INTRAMUSCULAR | Status: DC | PRN
  Administered 2022-02-28 – 2022-03-01 (×3): 4 mg via INTRAVENOUS
  Filled 2022-02-27 (×3): qty 2

## 2022-02-27 MED ORDER — CHLORHEXIDINE GLUCONATE CLOTH 2 % EX PADS: 6.0000 | MEDICATED_PAD | Freq: Once | CUTANEOUS | Status: DC

## 2022-02-27 MED ORDER — BUPIVACAINE LIPOSOME 1.3 % IJ SUSP
INTRAMUSCULAR | Status: DC | PRN
  Administered 2022-02-27: 20 mL

## 2022-02-27 MED ORDER — ONDANSETRON HCL 4 MG PO TABS: 4.0000 mg | ORAL_TABLET | Freq: Four times a day (QID) | ORAL | Status: DC | PRN

## 2022-02-27 MED ORDER — MORPHINE SULFATE (PF) 2 MG/ML IV SOLN
2.0000 mg | INTRAVENOUS | Status: DC | PRN
  Administered 2022-02-27 – 2022-03-01 (×7): 2 mg via INTRAVENOUS
  Filled 2022-02-27 (×7): qty 1
  Filled 2022-02-27: qty 2

## 2022-02-27 MED ORDER — CHLORHEXIDINE GLUCONATE 0.12 % MT SOLN
15.0000 mL | Freq: Once | OROMUCOSAL | Status: AC
  Administered 2022-02-27: 15 mL via OROMUCOSAL

## 2022-02-27 MED ORDER — METRONIDAZOLE 500 MG PO TABS: 1000.0000 mg | ORAL_TABLET | ORAL | Status: DC

## 2022-02-27 MED ORDER — SUGAMMADEX SODIUM 500 MG/5ML IV SOLN
INTRAVENOUS | Status: DC | PRN
Start: 2022-02-27 — End: 2022-02-27
  Administered 2022-02-27: 700 mg via INTRAVENOUS

## 2022-02-27 MED ORDER — PROCHLORPERAZINE EDISYLATE 10 MG/2ML IJ SOLN: 5.0000 mg | Freq: Four times a day (QID) | INTRAMUSCULAR | Status: DC | PRN

## 2022-02-27 MED ORDER — 0.9 % SODIUM CHLORIDE (POUR BTL) OPTIME
TOPICAL | Status: DC | PRN
  Administered 2022-02-27: 2000 mL

## 2022-02-27 MED ORDER — SODIUM CHLORIDE (PF) 0.9 % IJ SOLN
INTRAMUSCULAR | Status: AC
  Filled 2022-02-27: qty 10

## 2022-02-27 MED ORDER — MIDAZOLAM HCL 2 MG/2ML IJ SOLN
INTRAMUSCULAR | Status: AC
  Filled 2022-02-27: qty 2

## 2022-02-27 MED ORDER — KETAMINE HCL 10 MG/ML IJ SOLN
INTRAMUSCULAR | Status: DC | PRN
  Administered 2022-02-27 (×3): 10 mg via INTRAVENOUS
  Administered 2022-02-27: 20 mg via INTRAVENOUS

## 2022-02-27 MED ORDER — ENOXAPARIN SODIUM 40 MG/0.4ML IJ SOSY
40.0000 mg | PREFILLED_SYRINGE | INTRAMUSCULAR | Status: DC
  Administered 2022-02-28 – 2022-03-02 (×3): 40 mg via SUBCUTANEOUS
  Filled 2022-02-27 (×3): qty 0.4

## 2022-02-27 MED ORDER — ENALAPRILAT 1.25 MG/ML IV SOLN: 0.6250 mg | Freq: Four times a day (QID) | INTRAVENOUS | Status: DC | PRN

## 2022-02-27 MED ORDER — TRAMADOL HCL 50 MG PO TABS
50.0000 mg | ORAL_TABLET | Freq: Four times a day (QID) | ORAL | Status: DC | PRN
  Administered 2022-02-27 – 2022-03-01 (×3): 100 mg via ORAL
  Filled 2022-02-27 (×3): qty 2

## 2022-02-27 MED ORDER — HYDRALAZINE HCL 20 MG/ML IJ SOLN: 10.0000 mg | INTRAMUSCULAR | Status: DC | PRN

## 2022-02-27 MED ORDER — POLYETHYLENE GLYCOL 3350 17 GM/SCOOP PO POWD: 1.0000 | Freq: Once | ORAL | Status: DC

## 2022-02-27 MED ORDER — SIMETHICONE 80 MG PO CHEW
40.0000 mg | CHEWABLE_TABLET | Freq: Four times a day (QID) | ORAL | Status: DC | PRN
  Administered 2022-02-27 – 2022-03-01 (×3): 40 mg via ORAL
  Filled 2022-02-27 (×3): qty 1

## 2022-02-27 MED ORDER — MELATONIN 3 MG PO TABS: 3.0000 mg | ORAL_TABLET | Freq: Every evening | ORAL | Status: DC | PRN

## 2022-02-27 MED ORDER — DEXAMETHASONE SODIUM PHOSPHATE 10 MG/ML IJ SOLN
INTRAMUSCULAR | Status: DC | PRN
  Administered 2022-02-27: 8 mg via INTRAVENOUS

## 2022-02-27 MED ORDER — METHOCARBAMOL 500 MG PO TABS
1000.0000 mg | ORAL_TABLET | Freq: Four times a day (QID) | ORAL | Status: DC | PRN
  Administered 2022-02-27 – 2022-03-01 (×3): 1000 mg via ORAL
  Filled 2022-02-27 (×3): qty 2

## 2022-02-27 MED ORDER — KETAMINE HCL 50 MG/5ML IJ SOSY
PREFILLED_SYRINGE | INTRAMUSCULAR | Status: AC
  Filled 2022-02-27: qty 5

## 2022-02-27 MED ORDER — BISACODYL 5 MG PO TBEC: 20.0000 mg | DELAYED_RELEASE_TABLET | Freq: Once | ORAL | Status: DC

## 2022-02-27 MED ORDER — GABAPENTIN 300 MG PO CAPS
300.0000 mg | ORAL_CAPSULE | ORAL | Status: AC
Start: 2022-02-27 — End: 2022-02-27
  Administered 2022-02-27: 300 mg via ORAL
  Filled 2022-02-27: qty 1

## 2022-02-27 MED ORDER — ALVIMOPAN 12 MG PO CAPS
12.0000 mg | ORAL_CAPSULE | ORAL | Status: AC
Start: 2022-02-27 — End: 2022-02-27
  Administered 2022-02-27: 12 mg via ORAL
  Filled 2022-02-27: qty 1

## 2022-02-27 MED ORDER — FENTANYL CITRATE (PF) 100 MCG/2ML IJ SOLN
INTRAMUSCULAR | Status: DC | PRN
  Administered 2022-02-27 (×2): 100 ug via INTRAVENOUS
  Administered 2022-02-27: 50 ug via INTRAVENOUS
  Administered 2022-02-27 (×2): 100 ug via INTRAVENOUS

## 2022-02-27 MED ORDER — FENTANYL CITRATE (PF) 100 MCG/2ML IJ SOLN
INTRAMUSCULAR | Status: AC
  Filled 2022-02-27: qty 2

## 2022-02-27 MED ORDER — DIPHENHYDRAMINE HCL 50 MG/ML IJ SOLN: 12.5000 mg | Freq: Four times a day (QID) | INTRAMUSCULAR | Status: DC | PRN

## 2022-02-27 MED ORDER — PROPOFOL 10 MG/ML IV BOLUS
INTRAVENOUS | Status: DC | PRN
  Administered 2022-02-27: 300 mg via INTRAVENOUS
  Administered 2022-02-27: 50 mg via INTRAVENOUS

## 2022-02-27 MED ORDER — MIDAZOLAM HCL 5 MG/5ML IJ SOLN
INTRAMUSCULAR | Status: DC | PRN
  Administered 2022-02-27: 2 mg via INTRAVENOUS

## 2022-02-27 MED ORDER — DIPHENHYDRAMINE HCL 12.5 MG/5ML PO ELIX: 12.5000 mg | ORAL_SOLUTION | Freq: Four times a day (QID) | ORAL | Status: DC | PRN

## 2022-02-27 MED ORDER — ALVIMOPAN 12 MG PO CAPS
12.0000 mg | ORAL_CAPSULE | Freq: Two times a day (BID) | ORAL | Status: DC
  Administered 2022-02-28 – 2022-03-01 (×3): 12 mg via ORAL
  Filled 2022-02-27 (×3): qty 1

## 2022-02-27 MED ORDER — ENSURE PRE-SURGERY PO LIQD: 296.0000 mL | Freq: Once | ORAL | Status: DC

## 2022-02-27 MED ORDER — SUGAMMADEX SODIUM 500 MG/5ML IV SOLN
INTRAVENOUS | Status: AC
  Filled 2022-02-27: qty 5

## 2022-02-27 SURGICAL SUPPLY — 120 items
APL PRP STRL LF DISP 70% ISPRP (MISCELLANEOUS)
APPLIER CLIP 5 13 M/L LIGAMAX5 (MISCELLANEOUS)
APPLIER CLIP ROT 10 11.4 M/L (STAPLE)
APR CLP MED LRG 11.4X10 (STAPLE)
APR CLP MED LRG 5 ANG JAW (MISCELLANEOUS)
BAG COUNTER SPONGE SURGICOUNT (BAG) ×2 IMPLANT
BAG SPNG CNTER NS LX DISP (BAG)
BLADE EXTENDED COATED 6.5IN (ELECTRODE) IMPLANT
CANNULA REDUC XI 12-8 STAPL (CANNULA)
CANNULA REDUCER 12-8 DVNC XI (CANNULA) IMPLANT
CELLS DAT CNTRL 66122 CELL SVR (MISCELLANEOUS) IMPLANT
CHLORAPREP W/TINT 26 (MISCELLANEOUS) IMPLANT
CLIP APPLIE 5 13 M/L LIGAMAX5 (MISCELLANEOUS) IMPLANT
CLIP APPLIE ROT 10 11.4 M/L (STAPLE) IMPLANT
COVER SURGICAL LIGHT HANDLE (MISCELLANEOUS) ×6 IMPLANT
COVER TIP SHEARS 8 DVNC (MISCELLANEOUS) ×2 IMPLANT
COVER TIP SHEARS 8MM DA VINCI (MISCELLANEOUS) ×2
DEVICE TROCAR PUNCTURE CLOSURE (ENDOMECHANICALS) IMPLANT
DRAIN CHANNEL 19F RND (DRAIN) ×1 IMPLANT
DRAPE ARM DVNC X/XI (DISPOSABLE) ×8 IMPLANT
DRAPE COLUMN DVNC XI (DISPOSABLE) ×2 IMPLANT
DRAPE DA VINCI XI ARM (DISPOSABLE) ×8
DRAPE DA VINCI XI COLUMN (DISPOSABLE) ×2
DRAPE SURG IRRIG POUCH 19X23 (DRAPES) ×3 IMPLANT
DRSG OPSITE POSTOP 4X10 (GAUZE/BANDAGES/DRESSINGS) IMPLANT
DRSG OPSITE POSTOP 4X6 (GAUZE/BANDAGES/DRESSINGS) IMPLANT
DRSG OPSITE POSTOP 4X8 (GAUZE/BANDAGES/DRESSINGS) IMPLANT
DRSG TEGADERM 2-3/8X2-3/4 SM (GAUZE/BANDAGES/DRESSINGS) ×15 IMPLANT
DRSG TEGADERM 4X4.75 (GAUZE/BANDAGES/DRESSINGS) IMPLANT
ELECT PENCIL ROCKER SW 15FT (MISCELLANEOUS) ×3 IMPLANT
ELECT REM PT RETURN 15FT ADLT (MISCELLANEOUS) ×3 IMPLANT
ENDOLOOP SUT PDS II  0 18 (SUTURE)
ENDOLOOP SUT PDS II 0 18 (SUTURE) IMPLANT
EVACUATOR SILICONE 100CC (DRAIN) ×1 IMPLANT
GAUZE SPONGE 2X2 8PLY STRL LF (GAUZE/BANDAGES/DRESSINGS) ×2 IMPLANT
GLOVE ECLIPSE 8.0 STRL XLNG CF (GLOVE) ×9 IMPLANT
GLOVE INDICATOR 8.0 STRL GRN (GLOVE) ×9 IMPLANT
GOWN SRG XL LVL 4 BRTHBL STRL (GOWNS) ×2 IMPLANT
GOWN STRL NON-REIN XL LVL4 (GOWNS) ×2
GOWN STRL REUS W/ TWL XL LVL3 (GOWN DISPOSABLE) ×8 IMPLANT
GOWN STRL REUS W/TWL XL LVL3 (GOWN DISPOSABLE) ×8
GRASPER SUT TROCAR 14GX15 (MISCELLANEOUS) IMPLANT
HOLDER FOLEY CATH W/STRAP (MISCELLANEOUS) ×3 IMPLANT
IRRIG SUCT STRYKERFLOW 2 WTIP (MISCELLANEOUS) ×4
IRRIGATION SUCT STRKRFLW 2 WTP (MISCELLANEOUS) ×2 IMPLANT
KIT PROCEDURE DA VINCI SI (MISCELLANEOUS)
KIT PROCEDURE DVNC SI (MISCELLANEOUS) IMPLANT
KIT SIGMOIDOSCOPE (SET/KITS/TRAYS/PACK) IMPLANT
KIT TURNOVER KIT A (KITS) IMPLANT
NDL INSUFFLATION 14GA 120MM (NEEDLE) ×2 IMPLANT
NEEDLE INSUFFLATION 14GA 120MM (NEEDLE) ×2 IMPLANT
PACK CARDIOVASCULAR III (CUSTOM PROCEDURE TRAY) ×3 IMPLANT
PACK COLON (CUSTOM PROCEDURE TRAY) ×3 IMPLANT
PAD POSITIONING PINK XL (MISCELLANEOUS) ×3 IMPLANT
PROTECTOR NERVE ULNAR (MISCELLANEOUS) ×6 IMPLANT
RELOAD STAPLE 45 3.5 BLU DVNC (STAPLE) IMPLANT
RELOAD STAPLE 45 4.3 GRN DVNC (STAPLE) IMPLANT
RELOAD STAPLE 60 3.5 BLU DVNC (STAPLE) IMPLANT
RELOAD STAPLE 60 4.3 GRN DVNC (STAPLE) IMPLANT
RELOAD STAPLER 3.5X45 BLU DVNC (STAPLE) IMPLANT
RELOAD STAPLER 3.5X60 BLU DVNC (STAPLE) IMPLANT
RELOAD STAPLER 4.3X45 GRN DVNC (STAPLE) IMPLANT
RELOAD STAPLER 4.3X60 GRN DVNC (STAPLE) IMPLANT
RETRACTOR WND ALEXIS 18 MED (MISCELLANEOUS) IMPLANT
RTRCTR WOUND ALEXIS 18CM MED (MISCELLANEOUS)
SCISSORS LAP 5X35 DISP (ENDOMECHANICALS) ×3 IMPLANT
SEAL CANN UNIV 5-8 DVNC XI (MISCELLANEOUS) ×6 IMPLANT
SEAL XI 5MM-8MM UNIVERSAL (MISCELLANEOUS) ×6
SEALER VESSEL DA VINCI XI (MISCELLANEOUS) ×2
SEALER VESSEL EXT DVNC XI (MISCELLANEOUS) ×2 IMPLANT
SOLUTION ELECTROLUBE (MISCELLANEOUS) ×3 IMPLANT
SPIKE FLUID TRANSFER (MISCELLANEOUS) ×3 IMPLANT
SPONGE GAUZE 2X2 STER 10/PKG (GAUZE/BANDAGES/DRESSINGS) ×1
STAPLER 45 DA VINCI SURE FORM (STAPLE)
STAPLER 45 SUREFORM DVNC (STAPLE) IMPLANT
STAPLER 60 DA VINCI SURE FORM (STAPLE)
STAPLER 60 SUREFORM DVNC (STAPLE) IMPLANT
STAPLER CANNULA SEAL DVNC XI (STAPLE) ×2 IMPLANT
STAPLER CANNULA SEAL XI (STAPLE) ×2
STAPLER ECHELON POWER CIR 29 (STAPLE) IMPLANT
STAPLER ECHELON POWER CIR 31 (STAPLE) ×1 IMPLANT
STAPLER RELOAD 3.5X45 BLU DVNC (STAPLE)
STAPLER RELOAD 3.5X45 BLUE (STAPLE)
STAPLER RELOAD 3.5X60 BLU DVNC (STAPLE)
STAPLER RELOAD 3.5X60 BLUE (STAPLE)
STAPLER RELOAD 4.3X45 GREEN (STAPLE)
STAPLER RELOAD 4.3X45 GRN DVNC (STAPLE)
STAPLER RELOAD 4.3X60 GREEN (STAPLE)
STAPLER RELOAD 4.3X60 GRN DVNC (STAPLE)
STOPCOCK 4 WAY LG BORE MALE ST (IV SETS) ×6 IMPLANT
SURGILUBE 2OZ TUBE FLIPTOP (MISCELLANEOUS) IMPLANT
SUT MNCRL AB 4-0 PS2 18 (SUTURE) ×3 IMPLANT
SUT PDS AB 1 CT1 27 (SUTURE) ×6 IMPLANT
SUT PROLENE 0 CT 2 (SUTURE) IMPLANT
SUT PROLENE 2 0 KS (SUTURE) IMPLANT
SUT PROLENE 2 0 SH DA (SUTURE) ×1 IMPLANT
SUT SILK 2 0 (SUTURE)
SUT SILK 2 0 SH CR/8 (SUTURE) IMPLANT
SUT SILK 2-0 18XBRD TIE 12 (SUTURE) IMPLANT
SUT SILK 3 0 (SUTURE)
SUT SILK 3 0 SH CR/8 (SUTURE) ×3 IMPLANT
SUT SILK 3-0 18XBRD TIE 12 (SUTURE) IMPLANT
SUT V-LOC BARB 180 2/0GR6 GS22 (SUTURE)
SUT VIC AB 3-0 SH 18 (SUTURE) IMPLANT
SUT VIC AB 3-0 SH 27 (SUTURE)
SUT VIC AB 3-0 SH 27XBRD (SUTURE) IMPLANT
SUT VICRYL 0 UR6 27IN ABS (SUTURE) ×3 IMPLANT
SUT VLOC BARB 180 ABS3/0GR12 (SUTURE) ×6
SUTURE V-LC BRB 180 2/0GR6GS22 (SUTURE) IMPLANT
SUTURE VLOC BRB 180 ABS3/0GR12 (SUTURE) IMPLANT
SYR 10ML ECCENTRIC (SYRINGE) ×3 IMPLANT
SYS LAPSCP GELPORT 120MM (MISCELLANEOUS)
SYS WOUND ALEXIS 18CM MED (MISCELLANEOUS) ×2
SYSTEM LAPSCP GELPORT 120MM (MISCELLANEOUS) IMPLANT
SYSTEM WOUND ALEXIS 18CM MED (MISCELLANEOUS) ×2 IMPLANT
TOWEL OR NON WOVEN STRL DISP B (DISPOSABLE) ×3 IMPLANT
TRAY FOLEY MTR SLVR 16FR STAT (SET/KITS/TRAYS/PACK) ×3 IMPLANT
TROCAR ADV FIXATION 5X100MM (TROCAR) ×3 IMPLANT
TUBING CONNECTING 10 (TUBING) ×7 IMPLANT
TUBING INSUFFLATION 10FT LAP (TUBING) ×3 IMPLANT

## 2022-02-27 NOTE — Anesthesia Preprocedure Evaluation (Signed)
Anesthesia Evaluation  Patient identified by MRN, date of birth, ID band Patient awake    Reviewed: Allergy & Precautions, NPO status , Patient's Chart, lab work & pertinent test results  History of Anesthesia Complications (+) PONV and history of anesthetic complications  Airway Mallampati: III  TM Distance: <3 FB Neck ROM: Full    Dental no notable dental hx. (+) Teeth Intact, Dental Advisory Given   Pulmonary shortness of breath and with exertion, asthma , Current Smoker and Patient abstained from smoking.,    breath sounds clear to auscultation + decreased breath sounds      Cardiovascular hypertension, Normal cardiovascular exam Rhythm:Regular Rate:Normal  Untreated HTN   Neuro/Psych  Headaches, PSYCHIATRIC DISORDERS Anxiety Depression    GI/Hepatic negative GI ROS, Neg liver ROS, Diverticulitis S/P Colostomy   Endo/Other  Hypothyroidism Morbid obesityPCOS  Renal/GU negative Renal ROS  negative genitourinary   Musculoskeletal negative musculoskeletal ROS (+)   Abdominal (+) + obese,   Peds negative pediatric ROS (+)  Hematology  (+) Blood dyscrasia, anemia ,   Anesthesia Other Findings   Reproductive/Obstetrics negative OB ROS                             Anesthesia Physical  Anesthesia Plan  ASA: 3  Anesthesia Plan: General   Post-op Pain Management: Minimal or no pain anticipated, Ketamine IV*, Dilaudid IV, Precedex and Ofirmev IV (intra-op)*   Induction: Intravenous and Cricoid pressure planned  PONV Risk Score and Plan: 2 and Propofol infusion, Treatment may vary due to age or medical condition, Midazolam, Scopolamine patch - Pre-op, Ondansetron and Dexamethasone  Airway Management Planned: Oral ETT  Additional Equipment: None  Intra-op Plan:   Post-operative Plan: Extubation in OR  Informed Consent: I have reviewed the patients History and Physical, chart, labs and  discussed the procedure including the risks, benefits and alternatives for the proposed anesthesia with the patient or authorized representative who has indicated his/her understanding and acceptance.     Dental advisory given  Plan Discussed with: CRNA and Anesthesiologist  Anesthesia Plan Comments:         Anesthesia Quick Evaluation

## 2022-02-27 NOTE — Progress Notes (Signed)
Updated the family, sister [name] at 53. Lori Mcknight

## 2022-02-27 NOTE — Transfer of Care (Signed)
Immediate Anesthesia Transfer of Care Note  Patient: Lori Mcknight  Procedure(s) Performed: ROBOTIC OSTOMY TAKEDOWN LYSIS OF ADHESION RIGID PROCTOSCOPY  Patient Location: PACU  Anesthesia Type:General  Level of Consciousness: awake, alert , oriented and patient cooperative  Airway & Oxygen Therapy: Patient Spontanous Breathing and Patient connected to face mask oxygen  Post-op Assessment: Report given to RN, Post -op Vital signs reviewed and stable and Patient moving all extremities X 4  Post vital signs: Reviewed and stable  Last Vitals:  Vitals Value Taken Time  BP 137/90 02/27/22 1500  Temp 36.4 C 02/27/22 1457  Pulse 81 02/27/22 1505  Resp 17 02/27/22 1505  SpO2 96 % 02/27/22 1505  Vitals shown include unvalidated device data.  Last Pain:  Vitals:   02/27/22 1457  TempSrc:   PainSc: Asleep         Complications: No notable events documented.

## 2022-02-27 NOTE — Op Note (Signed)
02/27/2022  2:47 PM  PATIENT:  Lori Mcknight  36 y.o. 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  This assistant was needed for exposure, dissection, suction, tissue approximation, retraction, perception, etc.  ANESTHESIA:    local and general Regional TRANSVERSUS ABDOMINIS PLANE (TAP) nerve block for perioperative & postoperative pain control provided with liposomal bupivacaine (Experel) mixed with 0.25% bupivacaine as a Bilateral TAP block x 62mL each side at the level of the transverse abdominis & preperitoneal spaces along the flank at the anterior axillary line, from subcostal ridge to iliac crest under laparoscopic guidance      EBL:  Total I/O In: 1100 [I.V.:1000; IV Piggyback:100] Out: 460 [Urine:260; Blood:200]  Delay start of Pharmacological VTE agent (>24hrs) due to surgical blood loss or risk of bleeding:  no  DRAINS: 19 Fr Blake drain rests in pelvis around colorectal anastomosis  SPECIMEN:   End colostomy Distal anastomotic ring  DISPOSITION OF SPECIMEN:  PATHOLOGY  COUNTS:  YES  PLAN OF CARE: Admit to inpatient   PATIENT DISPOSITION:  PACU -  hemodynamically stable.  INDICATION: Pleasant patient status post colectomy with end ostomy for perforated diverticulitis.  Morbid obesity BMI 59.  The patient has recovered from that surgery and has understandably requested ostomy takedown.  Underwent colonoscopy that disproved any other pathology of concern.  Medically stabilized and felt reasonable to proceed.   I discussed the procedure with the patient:  The anatomy & physiology of the digestive tract was discussed.  The pathophysiology was discussed.  Possibility of remaining with an ostomy permanently was discussed.  I offered ostomy takedown.  Laparoscopic & open techniques were discussed.   Risks such as bleeding, infection, abscess, leak, reoperation, possible re-ostomy, injury to other organs, hernia, heart attack, death, and other risks were discussed.   I noted a good likelihood this will help address the problem.  Goals of post-operative recovery were discussed as well.  We will work to minimize complications.  Questions were answered.  The patient expresses understanding & wishes to proceed with surgery.  OR FINDINGS:    Moderate adhesions of small bowel to anterior abdominal wall and especially pelvis.  It is an end descending colon to anterior rectal stump 31 EEA anastomosis 15 cm from the anal verge.  Anterior circumference oversewn and patched with redundant colon mesentery as a mesentero-pexy   DESCRIPTION:   Informed consent was confirmed.  The patient underwent general anaesthesia without difficulty.  The patient was positioned appropriately.  VTE prevention in place.  The patient's abdomen was clipped, prepped, & draped in a sterile fashion.  Surgical timeout confirmed our plan.  Peritoneal entry with a laparoscopic port was obtained using Varess spring needle entry technique in the  upper abdomen as the patient was positioned in reverse Trendelenburg.  I induced carbon dioxide insufflation.  No change in end tidal CO2  measurements.  Full symmetrical abdominal distention.  Initial port was carefully placed.  Camera inspection revealed no injury.  Laparoscopically adhesion was done to free some greater omentum off the anterior abdominal wall and some loops of small bowel for better visualization extra ports were carefully placed under direct laparoscopic visualization.  Robot carefully docked.  We proceeded with lysis of adhesions to make sure there were no adhesions on the anterior abdominal wall, to the ostomy, and to the pelvis.  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  Freed descending colon adhesions to the preperitoneal space and anterior rectus fascia up into the subcutaneous tissues.  I then focused to free adhesions of the enlarged uterus and adnexa off the pelvis small bowel and rectal stump to better identify it.  Inspected small bowel loops to free off interloop adhesions with some closed-loop areas.  Ran the small bowel and confirmed no serosal injury or other abnormality from the ileocecal valve to the ligament of Treitz.    I focused on pelvic dissection.  Found the Prolene sutures at the rectal stump distal to the sacral promontory.  I elevated the rectal stump mesentery and was able to come into the presacral space and free the proximal to mid mesorectum off the presacral space for better mobilization.  Came around and freed off some visceral peritoneum on the rectal stump laterally to have ensured better mobility.  I brought up some blood EEA sizers from 25-33 sizes that can come up to the end of the rectal stump anteriorly.  Some thickening of the mesorectum posteriorly at the rectal stump but otherwise good and stable.  Decent mobility.  I ran the small bowel more time to confirm no injury or other abnormality.  Hemostasis good.  We undocked the robot and proceeded with colostomy takedown.  I made a biconcave curvilinear incision transversely around the ostomy.  I got into the  subcutaneous tissues.  I used careful focused right angle dissection and sharp dissection.  Some focused cautery dissection as well.  That helped to free adhesions to the subcutaneous tisses & fascia.  I was able to enter into the peritoneum focally.  I did a gentle finger sweep.  Gradually came around circumferentially and freed the bowel from remaining adhesions to the abdominal wall.  We were able eviscerate the descending colon.  Because of her morbid obesity and parastomal hernia we had excellent length that reached down to the introitus without difficulty.    I decided to resect the distal 5 cm to healthier viable colon.  I came through the colon mesentery looked more distally near the skin edge in came down more proximally.  I came through sharply and ensured a good bleeding marginal first artery and the colon mesentery.  Assured hemostasis.  I clamped the colon at the point of resection using a reusable pursestringer device.  Passed a 2-0 Keith needle. I transected at the descending/sigmoid junction with a scalpel. I got healthy bleeding mucosa.  We sent the rectosigmoid colon specimen off to go to pathology.  We sized the colon orifice.  I chose a 31 EEA anvil stapler system.  I reinforced the prolene pursestring with interrupted silk suture.  I placed the anvil to the open end of the proximal remaining colon and closed around it using the pursestring.    We did copious irrigation with crystalloid solution.  Hemostasis was good.  The  distal end of the colon at the handle easily reached down to the rectal stump, therefore, splenic flexure mobilization was not needed.      I scrubbed down and did gentle anal dilation and advanced the EEA stapler up the rectal stump. The spike was brought out at the provimal end of the rectal stump under direct visualization.  Dr Maisie Fus attached the anvil of the proximal colon the spike of the stapler.  We made sure that the colon mesentery laid straight without any  twisting or torsion.  Anvil was tightened down and held clamped for 60 seconds. The EEA stapler was fired and held clamped for 30 seconds. The stapler was released & removed. We noted 2 excellent anastomotic rings. Blue stitch is in the proximal ring.  I insufflated the rectum with a colon more proximally clamped with a rigid proctoscope and got bubbling immediately suspicious for a leak.  I cannot have good visualization by rigid proctoscopy.  We reposition and redocked the robot.  I did meticulous inspection of the anastomosis and confirmed no leaking laterally nor posteriorly.  At the right anterior midline port the colorectal anastomosis was a small number of fat where there is a pinhole leak easily seen.  Confirmed with insufflation with colon clamp more proximally.  Because we could see this in the tissues looked viable with no torsion or twisting we decided to repair this.  I used 3 oh V-Loc absorbable suture from the right lateral margin of the colorectal anastomosis and ran to the anterior midline.  I took a swath of redundant mesentery to lay over and patch at much like he would do an omentopexy.  I then started another stitch of 3 OV lock from the left lateral aspect and came to midline.  Overlapped the edges.  Because of morbid obesity and difficulty seeing with rigid proctoscopy, I switched to flexible sigmoidoscopy.  I was able to intubate the rectum and insufflated with colon clamped more proximally.  I was able to go across the colon anastomosis and confirmed good healthy mucosal tissue with no twisting or torsion.  Slowly return back to confirmed a healthy EEA anastomotic ring with good distention and no stricturing.  No bleeding.  We insufflated with a colon Approximately with irrigation with a negative air leak test., confirming good seal.  Because there was no torsion, ischemia, narrowing, distal anastomosis, and the repair was easily seen and covered and patched, nor other concerns; I felt we  do not need to do loop ileal diversion.  Dr. Maisie Fus agreed.  here was no tension of mesentery or bowel at the anastomosis.   Tissues looked viable.  Hemostasis was good.   Ureters & bowel uninjured.  The anastomosis looked healthy.  As a hedge we decided to leave a 23 [place] Blake drain to wrap around the anastomosis to help protect that and confirm no concerns.  Drain secured with 2-0 Prolene  We changed gown and gloves.  The patient was re-draped.  Sterile unused instruments were used from this point out per colon SSI prevention protocol.  I closed the 55mm port sites using Monocryl stitch and sterile dressing.  I closed the abdominal wall ostomy wound using #1 PDS transverse fascial closure.  She had a very thick abdominal wall and subcutaneous tissue so I did do some interrupted closure using 2-0 Vicryl at Scarpa's.  I excised some redundant skin and fat to have a nice transverse closure and avoid any dog earring.  Dense and interrupted deep  dermal sutures.  I closed the skin with some interrupted Monocryl stitches. I placed antibiotic-soaked umbilical tape wicks into the closure x2. I placed a sterile dressing.    Patient is being extubated go to recovery room. I discussed postop care with the patient in detail the office & in the holding area. Instructions are written. I discussed operative findings, updated the patient's status, discussed probable steps to recovery, and gave postoperative recommendations to the patient's sister, Berenice BoutonCherita Ingram .  Recommendations were made.  Questions were answered.  She expressed understanding & appreciation.   Ardeth SportsmanSteven C. Tysheka Fanguy, M.D., F.A.C.S. Gastrointestinal and Minimally Invasive Surgery [facility] [address] [place], [place] [address] [phone] Main / Paging

## 2022-02-27 NOTE — H&P (Signed)
02/27/2022     PROVIDER: Jarrett Soho, MD  Patient Care Team: Bertram Denver, NP as PCP - General (Family Medicine) Diamantina Monks, MD as Consulting Provider (General Surgery) Valeska Haislip, Shawn Route, MD as Consulting Provider (General Surgery)  [facility] MRN: [id number] DOB: 28-Apr-1986 DATE OF ENCOUNTER: 02/27/2022   Interval History:   The patient returns to the office after undergoing robotic assisted sigmoid colectomy with end colostomy Hartmann resection for perforated sigmoid colon due to diverticulitis with feculent peritonitis and multiple abscesses 09/17/2021.  Super morbidly obese woman with recurrent diverticulitis that was trying to get elective resection set up. Challenged by financial issues and getting Medicaid. Came in with perforated diverticulitis with multiple abscesses. I ended up having to do a Hartmann resection on her. Patient returns 3-1/2 months from emergency surgery. She went home a week after surgery. She has missed multiple postop appointments and this is the first time she is coming in. Is been a challenge for her to get Medicaid another help. She has been following with the ostomy clinic for care. Struggled with a lot of constipation. Eventually got on a bowel regimen to help clean her out. She empties colostomy bag about once a day. Noticed some pulling discomfort in left lateral to it but nothing too severe. No major leaking or falling off on the pouches. She is noted a couple episodes of rectal bleeding on her bottom with a little bit of mucus. Nothing too severe and seems to be tapering off. Appetite good. Energy level stable. She recently had a tooth abscess required drainage. She is nearly done with her amoxicillin. She is nondiabetic. She does not smoke. She is never had a colonoscopy.    Labs, Imaging and Diagnostic Testing:  Located in 'Care Everywhere' section of Epic EMR chart  PRIOR [facility] CLINIC NOTES:  Not applicable  SURGERY NOTES:  Located  in 'Care Everywhere' section of Epic EMR chart  PATHOLOGY:  Located in 'Care Everywhere' section of Epic EMR chart  Physical Examination:   Body mass index is 58.98 kg/m.  Constitutional: Not cachectic. Hygeine adequate.  Eyes: Normal extraocular movements. Sclera nonicteric Neuro: No major focal sensory defects. No major motor deficits. Psych: No severe agitation. No severe anxiety. Judgment & insight Adequate, Oriented x4, HENT: Normocephalic, Mucus membranes moist. No thrush.  Neck: Supple, No tracheal deviation.  Chest: Good respiratory excursion. No audible wheezing CV: No major extremity edema Ext: No obvious deformity or contracture. Edema: not present. No cyanosis Skin: Warm and dry Musculoskeletal: Severe joint rigidity not present. Mobility: no assist device moving easily without restrictions  Abdomen: Obese with panniculus Hernia: Not present. Incisions Clean & dry with normal healing ridge Tenderness at Left upper quadrant colostomy laterally. Colostomy pink with normal mucosa. Good seal. No rectal bleeding. Soft stool in bag.. Diastasis recti: Not present. Soft. Nondistended.   Gen: Inguinal hernia: Not present. Inguinal lymph nodes: without lymphadenopathy.   Rectal: (Deferred)    Assessment and Plan:   Lori Mcknight is a 36 y.o. female recovering s/p .  Diagnoses and all orders for this visit:  History of colonic diverticulitis  Obesity, morbid, BMI 50 or higher (CMS-HCC)  Colostomy in place (CMS-HCC)  Constipation, chronic  Abdominal pain, LUQ  History of rectal bleeding  External hemorrhoids    She is more than 3 months from her emergency Hartman resection for perforated diverticulitis. I think it is reasonable to consider colostomy takedown at some point.  Patient with history of dental abscesses.  Has  a recurrent one that is small.  Not placed on antibiotics.  No severe pain and no problem eating or swallowing.  We will follow expectantly for  now maybe reach out to her dentist to see if they want her back on antibiotics.  Colonoscopy shows no abnormalities and reassuring.  She is ready for surgery.  The anatomy & physiology of the digestive tract was discussed. The pathophysiology was discussed. Possibility of remaining with an ostomy permanently was discussed. I offered ostomy takedown. Laparoscopic & open techniques were discussed.   Risks such as bleeding, infection, abscess, leak, reoperation, possible re-ostomy, injury to other organs, need for repair of tissues / organs, need for further treatment, hernia, heart attack, death, and other risks were discussed. I noted a good likelihood this will help address the problem. Goals of post-operative recovery were discussed as well. We will work to minimize complications. Questions were answered. The patient expresses understanding & wishes to proceed with surgery.   Ardeth Sportsman, MD, FACS, MASCRS Esophageal, Gastrointestinal & Colorectal Surgery Robotic and Minimally Invasive Surgery  [facility], [facility]  [facility]  [address] [place], [place] [address] [phone] Fax [phone] Main  CONTACT INFORMATION:  Weekday (9AM-5PM): Call [facility] main office at [phone]  Weeknight (5PM-9AM) or Weekend/Holiday: Check [email or web address] (password " [email or web address]") for General Surgery [facility] coverage  (Please, do not use SecureChat as it is not reliable communication to operating surgeons for immediate patient care)    02/27/2022

## 2022-02-27 NOTE — Anesthesia Procedure Notes (Signed)
Procedure Name: Intubation Date/Time: 02/27/2022 10:32 AM Performed by: Jonna Munro, CRNA Pre-anesthesia Checklist: Patient identified, Patient being monitored, Timeout performed, Emergency Drugs available and Suction available Patient Re-evaluated:Patient Re-evaluated prior to induction Oxygen Delivery Method: Circle System Utilized Preoxygenation: Pre-oxygenation with 100% oxygen Induction Type: IV induction Ventilation: Mask ventilation without difficulty Laryngoscope Size: Mac and 3 Grade View: Grade II Tube type: Oral Tube size: 7.0 mm Number of attempts: 1 Airway Equipment and Method: stylet Placement Confirmation: ETT inserted through vocal cords under direct vision, positive ETCO2 and breath sounds checked- equal and bilateral Secured at: 21 cm Tube secured with: Tape Dental Injury: Teeth and Oropharynx as per pre-operative assessment

## 2022-02-27 NOTE — Anesthesia Postprocedure Evaluation (Signed)
Anesthesia Post Note  Patient: Lori Mcknight  Procedure(s) Performed: ROBOTIC OSTOMY TAKEDOWN LYSIS OF ADHESION RIGID PROCTOSCOPY     Patient location during evaluation: PACU Anesthesia Type: General Level of consciousness: awake and alert and oriented Pain management: pain level controlled Vital Signs Assessment: post-procedure vital signs reviewed and stable Respiratory status: spontaneous breathing, nonlabored ventilation and respiratory function stable Cardiovascular status: blood pressure returned to baseline and stable Postop Assessment: no apparent nausea or vomiting Anesthetic complications: no   No notable events documented.  Last Vitals:  Vitals:   02/27/22 1545 02/27/22 1600  BP: (!) 146/92 (!) 157/92  Pulse: 72 71  Resp: 18 19  Temp:  37.2 C  SpO2: 97% 97%    Last Pain:  Vitals:   02/27/22 1600  TempSrc:   PainSc: Asleep                 Tammie Ellsworth A.

## 2022-02-27 NOTE — Discharge Instructions (Addendum)
SURGERY: POST OP INSTRUCTIONS (Surgery for small bowel obstruction, colon resection, etc)   ######################################################################  EAT Gradually transition to a high fiber diet with a fiber supplement over the next few days after discharge  WALK Walk an hour a day.  Control your pain to do that.    CONTROL PAIN Control pain so that you can walk, sleep, tolerate sneezing/coughing, go up/down stairs.  HAVE A BOWEL MOVEMENT DAILY Keep your bowels regular to avoid problems.  OK to try a laxative to override constipation.  OK to use an antidairrheal to slow down diarrhea.  Call if not better after 2 tries  CALL IF YOU HAVE PROBLEMS/CONCERNS Call if you are still struggling despite following these instructions. Call if you have concerns not answered by these instructions  ######################################################################   DIET Follow a light diet the first few days at home.  Start with a bland diet such as soups, liquids, starchy foods, low fat foods, etc.  If you feel full, bloated, or constipated, stay on a ful liquid or pureed/blenderized diet for a few days until you feel better and no longer constipated. Be sure to drink plenty of fluids every day to avoid getting dehydrated (feeling dizzy, not urinating, etc.). Gradually add a fiber supplement to your diet over the next week.  Gradually get back to a regular solid diet.  Avoid fast food or heavy meals the first week as you are more likely to get nauseated. It is expected for your digestive tract to need a few months to get back to normal.  It is common for your bowel movements and stools to be irregular.  You will have occasional bloating and cramping that should eventually fade away.  Until you are eating solid food normally, off all pain medications, and back to regular activities; your bowels will not be normal. Focus on eating a low-fat, high fiber diet the rest of your life  (See Getting to [place], below).  CARE of your INCISION or WOUND  It is good for closed incisions and even open wounds to be washed every day.  Shower every day.  Short baths are fine.  Wash the incisions and wounds clean with soap & water.     You may leave closed incisions open to air if it is dry.   It is expected that you may have some drainage from her old colostomy site.  It should gradually dry up you may cover the incision with clean gauze & replace it after your daily shower for comfort.      ACTIVITIES as tolerated Start light daily activities --- self-care, walking, climbing stairs-- beginning the day after surgery.  Gradually increase activities as tolerated.  Control your pain to be active.  Stop when you are tired.  Ideally, walk several times a day, eventually an hour a day.   Most people are back to most day-to-day activities in a few weeks.  It takes 4-8 weeks to get back to unrestricted, intense activity. If you can walk 30 minutes without difficulty, it is safe to try more intense activity such as jogging, treadmill, bicycling, low-impact aerobics, swimming, etc. Save the most intensive and strenuous activity for last (Usually 4-8 weeks after surgery) such as sit-ups, heavy lifting, contact sports, etc.  Refrain from any intense heavy lifting or straining until you are off narcotics for pain control.  You will have off days, but things should improve week-by-week. DO NOT PUSH THROUGH PAIN.  Let pain be your guide: If  it hurts to do something, don't do it.  Pain is your body warning you to avoid that activity for another week until the pain goes down. You may drive when you are no longer taking narcotic prescription pain medication, you can comfortably wear a seatbelt, and you can safely make sudden turns/stops to protect yourself without hesitating due to pain. You may have sexual intercourse when it is comfortable. If it hurts to do something,  stop.  MEDICATIONS Take your usually prescribed home medications unless otherwise directed.   Blood thinners:  Usually you can restart any strong blood thinners after the second postoperative day.  It is OK to take aspirin right away.     If you are on strong blood thinners (warfarin/Coumadin, Plavix, Xerelto, Eliquis, Pradaxa, etc), discuss with your surgeon, medicine PCP, and/or cardiologist for instructions on when to restart the blood thinner & if blood monitoring is needed (PT/INR blood check, etc).     PAIN CONTROL Pain after surgery or related to activity is often due to strain/injury to muscle, tendon, nerves and/or incisions.  This pain is usually short-term and will improve in a few months.  To help speed the process of healing and to get back to regular activity more quickly, DO THE FOLLOWING THINGS TOGETHER: Increase activity gradually.  DO NOT PUSH THROUGH PAIN Use Ice and/or Heat Try Gentle Massage and/or Stretching Take over the counter pain medication Take Narcotic prescription pain medication for more severe pain  Good pain control = faster recovery.  It is better to take more medicine to be more active than to stay in bed all day to avoid medications.  Increase activity gradually Avoid heavy lifting at first, then increase to lifting as tolerated over the next 6 weeks. Do not "push through" the pain.  Listen to your body and avoid positions and maneuvers than reproduce the pain.  Wait a few days before trying something more intense Walking an hour a day is encouraged to help your body recover faster and more safely.  Start slowly and stop when getting sore.  If you can walk 30 minutes without stopping or pain, you can try more intense activity (running, jogging, aerobics, cycling, swimming, treadmill, sex, sports, weightlifting, etc.) Remember: If it hurts to do it, then don't do it! Use Ice and/or Heat You will have swelling and bruising around the incisions.  This will  take several weeks to resolve. Ice packs or heating pads (6-8 times a day, 30-60 minutes at a time) will help sooth soreness & bruising. Some people prefer to use ice alone, heat alone, or alternate between ice & heat.  Experiment and see what works best for you.  Consider trying ice for the first few days to help decrease swelling and bruising; then, switch to heat to help relax sore spots and speed recovery. Shower every day.  Short baths are fine.  It feels good!  Keep the incisions and wounds clean with soap & water.   Try Gentle Massage and/or Stretching Massage at the area of pain many times a day Stop if you feel pain - do not overdo it Take over the counter pain medication This helps the muscle and nerve tissues become less irritable and calm down faster Choose ONE of the following over-the-counter anti-inflammatory medications: Acetaminophen 500mg  tabs (Tylenol) 1-2 pills with every meal and just before bedtime (avoid if you have liver problems or if you have acetaminophen in you narcotic prescription) Naproxen 220mg  tabs (ex. Aleve, Naprosyn) 1-2 pills  twice a day (avoid if you have kidney, stomach, IBD, or bleeding problems) Ibuprofen 200mg  tabs (ex. Advil, Motrin) 3-4 pills with every meal and just before bedtime (avoid if you have kidney, stomach, IBD, or bleeding problems) Take with food/snack several times a day as directed for at least 2 weeks to help keep pain / soreness down & more manageable. Take Narcotic prescription pain medication for more severe pain A prescription for strong pain control is often given to you upon discharge (for example: oxycodone/Percocet, hydrocodone/Norco/Vicodin, or tramadol/Ultram) Take your pain medication as prescribed. Be mindful that most narcotic prescriptions contain Tylenol (acetaminophen) as well - avoid taking too much Tylenol. If you are having problems/concerns with the prescription medicine (does not control pain, nausea, vomiting, rash,  itching, etc.), please call us [phone] to see if we need to switch you to a different pain medicine that will work better for you and/or control your side effects better. If you need a refill on your pain medication, you must call the office before 4 pm and on weekdays only.  By federal law, prescriptions for narcotics cannot be called into a pharmacy.  They must be filled out on paper & picked up from our office by the patient or authorized caretaker.  Prescriptions cannot be filled after 4 pm nor on weekends.    WHEN TO CALL us [phone] Severe uncontrolled or worsening pain  Fever over 101 F (38.5 C) Concerns with the incision: Worsening pain, redness, rash/hives, swelling, bleeding, or drainage Reactions / problems with new medications (itching, rash, hives, nausea, etc.) Nausea and/or vomiting Difficulty urinating Difficulty breathing Worsening fatigue, dizziness, lightheadedness, blurred vision Other concerns If you are not getting better after two weeks or are noticing you are getting worse, contact our office [phone] for further advice.  We may need to adjust your medications, re-evaluate you in the office, send you to the emergency room, or see what other things we can do to help. The clinic staff is available to answer your questions during regular business hours (8:30am-5pm).  Please don't hesitate to call and ask to speak to one of our nurses for clinical concerns.    A surgeon from [facility] is always on call at the hospitals 24 hours/day If you have a medical emergency, go to the nearest emergency room or call 911.  FOLLOW UP in our office One the day of your discharge from the hospital (or the next business weekday), please call [facility] to set up or confirm an appointment to see your surgeon in the office for a follow-up appointment.  Usually it is 2-3 weeks after your surgery.   If you have skin staples at your incision(s), let  the office know so we can set up a time in the office for the nurse to remove them (usually around 10 days after surgery). Make sure that you call for appointments the day of discharge (or the next business weekday) from the hospital to ensure a convenient appointment time. IF YOU HAVE DISABILITY OR FAMILY LEAVE FORMS, BRING THEM TO THE OFFICE FOR PROCESSING.  DO NOT GIVE THEM TO YOUR DOCTOR.  [facility] [address] [place], [place], [place]  [address] ? [phone] - Main [phone] - [place],  [phone] - Fax [email or web address]    GETTING TO GOOD BOWEL HEALTH. It is expected for your digestive tract to need a few months to get back to normal.  It is common  for your bowel movements and stools to be irregular.  You will have occasional bloating and cramping that should eventually fade away.  Until you are eating solid food normally, off all pain medications, and back to regular activities; your bowels will not be normal.   Avoiding constipation The goal: ONE SOFT BOWEL MOVEMENT A DAY!    Drink plenty of fluids.  Choose water first. TAKE A FIBER SUPPLEMENT EVERY DAY THE REST OF YOUR LIFE During your first week back home, gradually add back a fiber supplement every day Experiment which form you can tolerate.   There are many forms such as powders, tablets, wafers, gummies, etc Psyllium bran (Metamucil), methylcellulose (Citrucel), Miralax or Glycolax, Benefiber, Flax Seed.  Adjust the dose week-by-week (1/2 dose/day to 6 doses a day) until you are moving your bowels 1-2 times a day.  Cut back the dose or try a different fiber product if it is giving you problems such as diarrhea or bloating. Sometimes a laxative is needed to help jump-start bowels if constipated until the fiber supplement can help regulate your bowels.  If you are tolerating eating & you are farting, it is okay to try a gentle laxative such as double dose MiraLax, prune juice,  or Milk of Magnesia.  Avoid using laxatives too often. Stool softeners can sometimes help counteract the constipating effects of narcotic pain medicines.  It can also cause diarrhea, so avoid using for too long. If you are still constipated despite taking fiber daily, eating solids, and a few doses of laxatives, call our office. Controlling diarrhea Try drinking liquids and eating bland foods for a few days to avoid stressing your intestines further. Avoid dairy products (especially milk & ice cream) for a short time.  The intestines often can lose the ability to digest lactose when stressed. Avoid foods that cause gassiness or bloating.  Typical foods include beans and other legumes, cabbage, broccoli, and dairy foods.  Avoid greasy, spicy, fast foods.  Every person has some sensitivity to other foods, so listen to your body and avoid those foods that trigger problems for you. Probiotics (such as active yogurt, Align, etc) may help repopulate the intestines and colon with normal bacteria and calm down a sensitive digestive tract Adding a fiber supplement gradually can help thicken stools by absorbing excess fluid and retrain the intestines to act more normally.  Slowly increase the dose over a few weeks.  Too much fiber too soon can backfire and cause cramping & bloating. It is okay to try and slow down diarrhea with a few doses of antidiarrheal medicines.   Bismuth subsalicylate (ex. Kayopectate, Pepto Bismol) for a few doses can help control diarrhea.  Avoid if pregnant.   Loperamide (Imodium) can slow down diarrhea.  Start with one tablet (2mg ) first.  Avoid if you are having fevers or severe pain.  ILEOSTOMY PATIENTS WILL HAVE CHRONIC DIARRHEA since their colon is not in use.    Drink plenty of liquids.  You will need to drink even more glasses of water/liquid a day to avoid getting dehydrated. Record output from your ileostomy.  Expect to empty the bag every 3-4 hours at first.  Most people with  a permanent ileostomy empty their bag 4-6 times at the least.   Use antidiarrheal medicine (especially Imodium) several times a day to avoid getting dehydrated.  Start with a dose at bedtime & breakfast.  Adjust up or down as needed.  Increase antidiarrheal medications as directed to avoid emptying the  bag more than 8 times a day (every 3 hours). Work with your wound ostomy nurse to learn care for your ostomy.  See ostomy care instructions. TROUBLESHOOTING IRREGULAR BOWELS 1) Start with a soft & bland diet. No spicy, greasy, or fried foods.  2) Avoid gluten/wheat or dairy products from diet to see if symptoms improve. 3) Miralax 17gm or flax seed mixed in [place]. water or juice-daily. May use 2-4 times a day as needed. 4) Gas-X, Phazyme, etc. as needed for gas & bloating.  5) Prilosec (omeprazole) over-the-counter as needed 6)  Consider probiotics (Align, Activa, etc) to help calm the bowels down  Call your doctor if you are getting worse or not getting better.  Sometimes further testing (cultures, endoscopy, X-ray studies, CT scans, bloodwork, etc.) may be needed to help diagnose and treat the cause of the diarrhea. [facility], [place], [place], [place], [place]  [address] [phone] - Main.    [phone]  - Toll Free.   [phone] - Fax [email or web address]

## 2022-02-28 ENCOUNTER — Encounter (HOSPITAL_COMMUNITY): Payer: Self-pay | Admitting: Surgery

## 2022-02-28 LAB — BASIC METABOLIC PANEL
Anion gap: 6 (ref 5–15)
BUN: 6 mg/dL (ref 6–20)
CO2: 25 mmol/L (ref 22–32)
Calcium: 8.9 mg/dL (ref 8.9–10.3)
Chloride: 109 mmol/L (ref 98–111)
Creatinine, Ser: 0.71 mg/dL (ref 0.44–1.00)
GFR, Estimated: >60 mL/min (ref >60)
Glucose, Bld: 128 mg/dL — ABNORMAL HIGH (ref 70–99)
Potassium: 3.6 mmol/L (ref 3.5–5.1)
Sodium: 140 mmol/L (ref 135–145)

## 2022-02-28 LAB — CBC
HCT: 41.6 % (ref 36.0–46.0)
Hemoglobin: 13.5 g/dL (ref 12.0–15.0)
MCH: 31.3 pg (ref 26.0–34.0)
MCHC: 32.5 g/dL (ref 30.0–36.0)
MCV: 96.3 fL (ref 80.0–100.0)
Platelets: 181 10*3/uL (ref 150–400)
RBC: 4.32 MIL/uL (ref 3.87–5.11)
RDW: 13.9 % (ref 11.5–15.5)
WBC: 13.7 10*3/uL — ABNORMAL HIGH (ref 4.0–10.5)
nRBC: 0 % (ref 0.0–0.2)

## 2022-02-28 LAB — MAGNESIUM: Magnesium: 1.9 mg/dL (ref 1.7–2.4)

## 2022-02-28 MED ORDER — GABAPENTIN 300 MG PO CAPS
300.0000 mg | ORAL_CAPSULE | Freq: Three times a day (TID) | ORAL | Status: DC
Start: 2022-02-28 — End: 2022-03-02
  Administered 2022-02-28 – 2022-03-02 (×6): 300 mg via ORAL
  Filled 2022-02-28 (×6): qty 1

## 2022-02-28 NOTE — Plan of Care (Signed)

## 2022-02-28 NOTE — Progress Notes (Signed)
[place] C Mergenthaler [id number] [birth year]  CARE TEAM:  PCP: Claiborne Rigg, NP  Outpatient Care Team: Patient Care Team: Claiborne Rigg, NP as PCP - General (Nurse Practitioner) Allie Bossier, MD (Inactive) as Consulting Physician (Obstetrics and Gynecology) Karie Soda, MD as Consulting Physician (Colon and Rectal Surgery) Ccs, Md, MD as Consulting Physician (General Surgery) Despina Arias, MD as Consulting Physician (Urology)  Inpatient Treatment Team: Treatment Team: Attending Provider: Karie Soda, MD; Technician: Desma Mcgregor, NT; Registered Nurse: Donavan Burnet, RN; Pharmacist: Lucia Gaskins, [facility]   Problem List:   Principal Problem:   Diverticulitis of colon Active Problems:   Morbid obesity with BMI of 50.0-59.9, adult 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   It is an end descending colon to anterior rectal stump 31 EEA anastomosis 15 cm from the anal verge.  Anterior circumference oversewn and patched with redundant colon mesentery as a mesentero-pexy   Assessment  Gradually recovering  [facility] Stay = 1 days)  Plan:  -ERAS colectomy pathway -Advance diet gradually -Medlock IV fluids.  As needed boluses -Foley removed.  I&O cath as needed -Nothing per rectum -Umbilical tape wicks in partial colostomy closure.  Plan to remove postop 3-5 to minimize cerumen portion and large open wound.  Repack  PRN -Fiber bowel regimen -Try to improve pain control.  Continue scheduled Tylenol.  Increase gabapentin.  Opiates and muscle relaxants for breakthrough pain.  The fact she is already mobilizing is a hopeful sign -Hypothyroidism - levothyroxine -HTN control -VTE prophylaxis- SCDs, etc -mobilize as tolerated to help recovery  Disposition:  Disposition:  The patient is from: Home  Anticipate discharge to:  Home  Anticipated Date of Discharge is:  May 21,2023    Barriers to discharge:  Pending Clinical improvement (more likely than not)  Patient currently is NOT MEDICALLY STABLE for discharge from the hospital from a surgery standpoint.      I reviewed Consultant anesthesia notes, last 24 h vitals and pain scores, last 48 h intake and output, last 24 h labs and trends, and last 24 h imaging results. I have reviewed this patient's available data, including medical history, events of note, test results, etc as part of my evaluation.  A significant portion of that time was spent in counseling.  Care during the described time interval was provided by me.  This care required moderate level of medical decision making.  02/28/2022    Subjective: (Chief complaint)  Patient with soreness.  Heating pad and Tylenol helping.  Sister in room.  Patient walked in hallways already.  Tolerating clears.  Advance to pured/full liquid diet  Having flatus.  Has passed some blood as well.  No large clots  Nursing team just outside room  Objective:  Vital signs:  Vitals:   02/27/22 1936 02/27/22 2051 02/28/22 0133 02/28/22 0626  BP: (!) 143/96 (!) 155/99 133/79 121/86  Pulse: 74 77 89 83  Resp: 16 18 17  16  Temp: 97.8 F (36.6 C) 97.8 F (36.6 C) 98.8 F (37.1 C) 97.8 F (36.6 C)  TempSrc: Oral Oral Oral Oral  SpO2: 99% 100% 91% 100%  Weight:      Height:        Last BM Date : 02/26/22  Intake/Output   Yesterday:  05/18 0701 - 05/19 0700 In: 1740 [P.O.:540; I.V.:1100; IV  Piggyback:100] Out: 3075 [Urine:2235; Drains:590; Blood:250] This shift:  Total I/O In: 360 [P.O.:360] Out: 1835 [Urine:1775; Drains:60]  Bowel function:  Flatus: YES  BM:  No  Drain: Serosanguinous   Physical Exam:  General: Pt awake/alert in no acute distress Eyes: PERRL, normal EOM.  Sclera clear.  No icterus Neuro: CN II-XII intact w/o focal sensory/motor deficits. Lymph: No head/neck/groin lymphadenopathy Psych:  No delerium/psychosis/paranoia.  Oriented x 4 HENT: Normocephalic, Mucus membranes moist.  No thrush Neck: Supple, No tracheal deviation.  No obvious thyromegaly Chest: No pain to chest wall compression.  Good respiratory excursion.  No audible wheezing CV:  Pulses intact.  Regular rhythm.  No major extremity edema MS: Normal AROM mjr joints.  No obvious deformity  Abdomen: Morbidly obese with large panniculus.  Soft.  Mildy distended.  Mildly tender at incisions only.  Old colostomy site dressing with minimal drainage.  No evidence of peritonitis.  No incarcerated hernias.  Ext:   No deformity.  No mjr edema.  No cyanosis Skin: No petechiae / purpurea.  No major sores.  Warm and dry    Results:   Cultures: No results found for this or any previous visit (from the past 720 hour(s)).  Labs: Results for orders placed or performed during the hospital encounter of 02/27/22 (from the past 48 hour(s))  Pregnancy, urine     Status: None   Collection Time: 02/27/22  9:06 AM  Result Value Ref Range   Preg Test, Ur NEGATIVE NEGATIVE    Comment:        THE SENSITIVITY OF THIS METHODOLOGY IS >20 mIU/mL. Performed at [facility], [address] [place], [place] [address]   Basic metabolic panel     Status: Abnormal   Collection Time: 02/28/22  4:47 AM  Result Value Ref Range   Sodium 140 135 - 145 mmol/L   Potassium 3.6 3.5 - 5.1 mmol/L   Chloride 109 98 - 111 mmol/L   CO2 25 22 - 32 mmol/L   Glucose, Bld 128 (H) 70 - 99 mg/dL    Comment:  Glucose reference range applies only to samples taken after fasting for at least 8 hours.   BUN 6 6 - 20 mg/dL   Creatinine, Ser 6.040.71 0.44 - 1.00 mg/dL   Calcium 8.9 8.9 - 54.010.3 mg/dL   GFR, Estimated >98>60 >11>60 mL/min    Comment: (NOTE) Calculated using the CKD-EPI Creatinine Equation (2021)    Anion gap 6 5 - 15    Comment: Performed at [facility], [address] [place], [place] [address]  CBC     Status: Abnormal   Collection Time: 02/28/22  4:47 AM  Result Value Ref Range   WBC 13.7 (H) 4.0 - 10.5 K/uL   RBC 4.32 3.87 - 5.11 MIL/uL   Hemoglobin 13.5 12.0 - 15.0 g/dL   HCT 29.541.6 62.136.0 - 30.846.0 %   MCV 96.3 80.0 - 100.0 fL   MCH 31.3 26.0 - 34.0 pg   MCHC 32.5 30.0 - 36.0 g/dL   RDW 65.713.9 84.611.5 - 96.215.5 %  Platelets 181 150 - 400 K/uL   nRBC 0.0 0.0 - 0.2 %    Comment: Performed at [facility], [address] [place], [place] [address]  Magnesium     Status: None   Collection Time: 02/28/22  4:47 AM  Result Value Ref Range   Magnesium 1.9 1.7 - 2.4 mg/dL    Comment: Performed at [facility], [address], [place], [place] [address]    Imaging / Studies: No results found.  Medications / Allergies: per chart  Antibiotics: Anti-infectives (From admission, onward)    Start     Dose/Rate Route Frequency Ordered Stop   02/27/22 2200  cefoTEtan (CEFOTAN) 2 g in sodium chloride 0.9 % 100 mL IVPB        2 g 200 mL/hr over 30 Minutes Intravenous Every 12 hours 02/27/22 1624 02/27/22 2208   02/27/22 1400  neomycin (MYCIFRADIN) tablet 1,000 mg  Status:  Discontinued       See Hyperspace for full Linked Orders Report.   1,000 mg Oral 3 times per day 02/27/22 0905 02/27/22 0909   02/27/22 1400  metroNIDAZOLE (FLAGYL) tablet 1,000 mg  Status:  Discontinued       See Hyperspace for full Linked Orders Report.   1,000 mg Oral 3 times per day 02/27/22 0905 02/27/22 0909   02/27/22 0915  cefoTEtan (CEFOTAN) 2 g in sodium chloride  0.9 % 100 mL IVPB        2 g 200 mL/hr over 30 Minutes Intravenous On call to O.R. 02/27/22 0905 02/27/22 1046         Note: Portions of this report may have been transcribed using voice recognition software. Every effort was made to ensure accuracy; however, inadvertent computerized transcription errors may be present.   Any transcriptional errors that result from this process are unintentional.    Ardeth Sportsman, MD, FACS, MASCRS Esophageal, Gastrointestinal & Colorectal Surgery Robotic and Minimally Invasive Surgery  [facility], [facility]  [facility]  [address] [place], [place] [address] [phone] Fax [phone] Main  CONTACT INFORMATION:  Weekday (9AM-5PM): Call [facility] main office at [phone]  Weeknight (5PM-9AM) or Weekend/Holiday: Check [email or web address] (password " [email or web address]") for General Surgery [facility] coverage  (Please, do not use SecureChat as it is not reliable communication to operating surgeons for immediate patient care)      02/28/2022  6:58 AM

## 2022-03-01 DIAGNOSIS — K122 Cellulitis and abscess of mouth: Secondary | ICD-10-CM

## 2022-03-01 MED ORDER — ALVIMOPAN 12 MG PO CAPS
12.0000 mg | ORAL_CAPSULE | Freq: Two times a day (BID) | ORAL | Status: DC
  Filled 2022-03-01: qty 1

## 2022-03-01 MED ORDER — AMOXICILLIN 500 MG PO CAPS
500.0000 mg | ORAL_CAPSULE | Freq: Three times a day (TID) | ORAL | Status: DC
  Administered 2022-03-01 – 2022-03-02 (×4): 500 mg via ORAL
  Filled 2022-03-01 (×5): qty 1

## 2022-03-01 NOTE — Progress Notes (Signed)
Lori Mcknight [id number] 1986/09/17  CARE TEAM:  PCP: [name], NP  Outpatient Care Team: Patient Care Team: [name], NP as PCP - General (Nurse Practitioner) [name], MD (Inactive) as Consulting Physician (Obstetrics and Gynecology) [name], MD as Consulting Physician (Colon and Rectal Surgery) Ccs, Md, MD as Consulting Physician (General Surgery) [name], MD as Consulting Physician (Urology)  Inpatient Treatment Team: Treatment Team: Attending Provider: [name], MD; Registered Nurse: Dorinda Hill, RN; Utilization Review: Claudie Leach, RN   Problem List:   Principal Problem:   Diverticulitis of colon Active Problems:   Morbid obesity with BMI of 50.0-59.9, adult [facility])   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   It is an end descending colon to anterior rectal stump 31 EEA anastomosis 15 cm from the anal verge.  Anterior circumference oversewn and patched with redundant colon mesentery as a mesentero-pexy   Assessment  Gradually recovering  [facility] Stay = 2 days)  Plan:  -ERAS colectomy pathway -Advance diet gradually -keep at full/dysphagia 1 for now.  Try and advance later -Medlock IV fluids.  As needed boluses  -Patient claims her oral abscess/swelling is a little worse.  Will restart amoxicillin which she seemed to have taken last time.  Hopefully will NOT get C.  difficile.  If worse may need to consult ENT/oral surgery  -Foley removed.  I&O cath as needed -Nothing per rectum -Umbilical tape wicks in partial colostomy closure.  Plan to remove postop 3-5 to minimize seroma formation and large open wound.  Repack PRN -Fiber bowel regimen -Pain control.  Continue scheduled Tylenol.  Increase gabapentin.  Opiates and muscle relaxants for breakthrough pain.  The fact she is already mobilizing is a hopeful sign -Nausea control. -Hypothyroidism - levothyroxine -HTN control -VTE prophylaxis- SCDs, etc -mobilize as tolerated to help recovery  Disposition:  Disposition:  The patient is from: Home  Anticipate discharge to:  Home  Anticipated Date of Discharge is:  May 21,2023    Barriers to discharge:  Pending Clinical improvement (more likely than not)  Patient currently is NOT MEDICALLY STABLE for discharge from the hospital from a surgery standpoint.      I reviewed Consultant anesthesia notes, last 24 h vitals and pain scores, last 48 h intake and output, last 24 h labs and trends, and last 24 h imaging results. I have reviewed this patient's available data, including medical history, events of note, test results, etc as part of my evaluation.  A significant portion of that time was spent in counseling.  Care during the described time interval was provided by me.  This care required moderate level of medical decision making.  03/01/2022    Subjective: (Chief complaint)  Patient passing gas but no bowel movement yet.  Nursing in room.  Nauseated.  Medicine helping.  Not much appetite but tolerated some liquids.  Has not to go home  Objective:  Vital signs:  Vitals:  02/28/22 0931 02/28/22 1343 02/28/22 2137 03/01/22 0555  BP: 122/71 125/72 117/65 123/77  Pulse: 91 83 85 67  Resp: 18 18 16 14   Temp: 98.6 F (37 C) 98.7 F (37.1 C) 98.6 F (37 C) 98.2 F (36.8 C)  TempSrc: Oral Oral Oral Oral  SpO2: 97% 98% 97% 99%   Weight:    (!) 163.6 kg  Height:        Last BM Date : 02/28/22  Intake/Output   Yesterday:  05/19 0701 - 05/20 0700 In: 360 [P.O.:360] Out: 390 [Urine:200; Drains:190] This shift:  Total I/O In: -  Out: 30 [Drains:30]  Bowel function:  Flatus: YES  BM:  No  Drain: (No drain)   Physical Exam:  General: Pt awake/alert in no acute distress Eyes: PERRL, normal EOM.  Sclera clear.  No icterus Neuro: CN II-XII intact w/o focal sensory/motor deficits. Lymph: No head/neck/groin lymphadenopathy Psych:  No delerium/psychosis/paranoia.  Oriented x 4 HENT: Normocephalic, Mucus membranes moist.  No thrush Neck: Supple, No tracheal deviation.  No obvious thyromegaly Chest: No pain to chest wall compression.  Good respiratory excursion.  No audible wheezing CV:  Pulses intact.  Regular rhythm.  No major extremity edema MS: Normal AROM mjr joints.  No obvious deformity  Abdomen: Morbidly obese with large panniculus.  Soft.  Mildy distended.  Mildly tender at incisions only.  Old colostomy site dressing with minimal drainage.  No evidence of peritonitis.  No incarcerated hernias.  Ext:   No deformity.  No mjr edema.  No cyanosis Skin: No petechiae / purpurea.  No major sores.  Warm and dry    Results:   Cultures: No results found for this or any previous visit (from the past 720 hour(s)).  Labs: Results for orders placed or performed during the hospital encounter of 02/27/22 (from the past 48 hour(s))  Pregnancy, urine     Status: None   Collection Time: 02/27/22  9:06 AM  Result Value Ref Range   Preg Test, Ur NEGATIVE NEGATIVE    Comment:        THE SENSITIVITY OF THIS METHODOLOGY IS >20 mIU/mL. Performed at [facility], [place] [address], [place], [place] [id number]   Basic metabolic panel     Status: Abnormal   Collection Time: 02/28/22  4:47 AM  Result Value Ref Range   Sodium 140 135 - 145 mmol/L   Potassium 3.6 3.5 - 5.1 mmol/L   Chloride 109  98 - 111 mmol/L   CO2 25 22 - 32 mmol/L   Glucose, Bld 128 (H) 70 - 99 mg/dL    Comment: Glucose reference range applies only to samples taken after fasting for at least 8 hours.   BUN 6 6 - 20 mg/dL   Creatinine, Ser 0.71 0.44 - 1.00 mg/dL   Calcium 8.9 8.9 - 10.3 mg/dL   GFR, Estimated >60 >60 mL/min    Comment: (NOTE) Calculated using the CKD-EPI Creatinine Equation (2021)    Anion gap 6 5 - 15    Comment: Performed at [facility], [place] [address], [place],  [address]  CBC     Status: Abnormal   Collection Time: 02/28/22  4:47 AM  Result Value Ref Range   WBC 13.7 (H) 4.0 - 10.5 K/uL   RBC 4.32 3.87 - 5.11 MIL/uL   Hemoglobin 13.5 12.0 - 15.0 g/dL   HCT 41.6 36.0 - 46.0 %   MCV 96.3 80.0 - 100.0 fL   MCH  31.3 26.0 - 34.0 pg   MCHC 32.5 30.0 - 36.0 g/dL   RDW 13.9 11.5 - 15.5 %   Platelets 181 150 - 400 K/uL   nRBC 0.0 0.0 - 0.2 %    Comment: Performed at [facility], [place] [address] [place], [place] [address]  Magnesium     Status: None   Collection Time: 02/28/22  4:47 AM  Result Value Ref Range   Magnesium 1.9 1.7 - 2.4 mg/dL    Comment: Performed at [facility], [place] [address], [place], [place] [address]    Imaging / Studies: No results found.  Medications / Allergies: per chart  Antibiotics: Anti-infectives (From admission, onward)    Start     Dose/Rate Route Frequency Ordered Stop   02/27/22 2200  cefoTEtan (CEFOTAN) 2 g in sodium chloride 0.9 % 100 mL IVPB        2 g 200 mL/hr over 30 Minutes Intravenous Every 12 hours 02/27/22 1624 02/28/22 1637   02/27/22 1400  neomycin (MYCIFRADIN) tablet 1,000 mg  Status:  Discontinued       See Hyperspace for full Linked Orders Report.   1,000 mg Oral 3 times per day 02/27/22 0905 02/27/22 0909   02/27/22 1400  metroNIDAZOLE (FLAGYL) tablet 1,000 mg  Status:  Discontinued       See Hyperspace for full Linked Orders Report.   1,000 mg Oral 3 times per  day 02/27/22 0905 02/27/22 0909   02/27/22 0915  cefoTEtan (CEFOTAN) 2 g in sodium chloride 0.9 % 100 mL IVPB        2 g 200 mL/hr over 30 Minutes Intravenous On call to O.R. 02/27/22 0905 02/27/22 1046         Note: Portions of this report may have been transcribed using voice recognition software. Every effort was made to ensure accuracy; however, inadvertent computerized transcription errors may be present.   Any transcriptional errors that result from this process are unintentional.    [name], MD, FACS, MASCRS Esophageal, Gastrointestinal & Colorectal Surgery Robotic and Minimally Invasive Surgery  [facility], [place]  [place] [place], [place] [address] [phone] Fax [phone] Main  CONTACT INFORMATION:  Weekday (9AM-5PM): Call [facility] main office at [phone]  Weeknight (5PM-9AM) or Weekend/Holiday: Check [email or web address] (password " [email or web address]") for General Surgery [facility] coverage  (Please, do not use SecureChat as it is not reliable communication to operating surgeons for immediate patient care)      03/01/2022  8:38 AM

## 2022-03-02 NOTE — Discharge Summary (Signed)
Physician Discharge Summary    Patient ID: Lori Mcknight MRN: [id number] DOB/AGE: 1986/01/18  36 y.o.  Patient Care Team: Claiborne RiggFleming, Zelda W, NP as PCP - General (Nurse Practitioner) Allie Bossierove, Myra C, MD (Inactive) as Consulting Physician (Obstetrics and Gynecology) Karie SodaGross, Athanasia Stanwood, MD as Consulting Physician (Colon and Rectal Surgery) Ccs, Md, MD as Consulting Physician (General Surgery) Despina AriasMachen, Graham L, MD as Consulting Physician (Urology)  Admit date: 02/27/2022  Discharge date: 03/02/2022  Hospital Stay = 3 days    Discharge Diagnoses:  Principal Problem:   Diverticulitis of colon Active Problems:   Morbid obesity with BMI of 50.0-59.9, adult [facility](HCC)   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  Postoperatively, the patient gradually mobilized and advanced to a solid diet.  Pain and other symptoms were treated aggressively.    By the time of discharge, the patient was walking well the hallways, eating food, having flatus.  Pain was well-controlled on an oral medications.  Based on meeting discharge criteria and continuing to recover, I felt it was safe for the patient to be discharged from the hospital to further recover with close followup. Postoperative recommendations were discussed in detail.  They are written as well.  Discharged Condition: good  Discharge Exam: Blood pressure 113/60, pulse 60, temperature 98.4 F (36.9 C), temperature source Oral, resp. rate 18, height 5\' 6"  (1.676 m),  weight (!) 168 kg, last menstrual period 01/20/2022, SpO2 99 %.  General: Pt awake/alert/oriented x4 in No acute distress Eyes: PERRL, normal EOM.  Sclera clear.  No icterus Neuro: CN II-XII intact w/o focal sensory/motor deficits. Lymph: No head/neck/groin lymphadenopathy Psych:  No delerium/psychosis/paranoia HENT: Normocephalic, Mucus membranes moist.  No thrush Neck: Supple, No tracheal deviation Chest:  No chest wall pain w good excursion CV:  Pulses intact.  Regular rhythm MS: Normal AROM mjr joints.  No obvious deformity Abdomen: Soft.  Nondistended.  Mildly tender at incisions only.  Dressings clean dry intact.  Dressings and wicks to be removed.  Drain with minimal serosanguineous drainage no evidence of peritonitis.  No incarcerated hernias. Ext:  SCDs BLE.  No mjr edema.  No cyanosis Skin: No petechiae / purpura   Disposition:    Follow-up Information     Karie SodaGross, Katrianna Friesenhahn, MD Follow up in 3 week(s).   Specialties: General Surgery, Colon and Rectal Surgery Why: To follow up after your operation Contact information: [address] [place] [id number] [phone]                 Discharge disposition: 01-Home or Self Care       Discharge Instructions     Call MD for:   Complete by: As directed    FEVER > 101.5 F (Temperatures <101.21F can occasionally happen and are not significant)   Call MD for:  extreme fatigue   Complete by: As directed    Call MD for:  persistant dizziness or light-headedness   Complete by: As directed    Call MD for:  persistant nausea and vomiting  Complete by: As directed    Call MD for:  redness, tenderness, or signs of infection (pain, swelling, redness, odor or green/yellow discharge around incision site)   Complete by: As directed    Call MD for:  severe uncontrolled pain   Complete by: As directed    Diet - low sodium heart healthy   Complete by: As directed    Follow a light diet the first few days at  home.    If you feel full, bloated, or constipated, stay on a liquid diet until you feel better and not constipated. Gradually get back to a solid diet.  Avoid fast food or heavy meals the first week as you are more likely to get nauseated. It is expected for your digestive tract to need a few months to get back to normal.   Discharge wound care:   Complete by: As directed    In general, it is encouraged that you remove your dressing and packing, shower with soap & water, and replace your dressing once a day.   .   Eventually your body will heal & pull the open wound closed over the next few months.  Raw open wounds will occasionally bleed or secrete yellow drainage until it heals closed.   Pressure on the dressing for 30 minutes will stop most wound bleeding Drain sites will drain a little until the drain is removed.   Driving Restrictions   Complete by: As directed    You may drive when you are no longer taking narcotic prescription pain medication, you can comfortably wear a seatbelt, and you can safely make sudden turns/stops to protect yourself without hesitating due to pain.   Increase activity slowly   Complete by: As directed    Lifting restrictions   Complete by: As directed    Start light daily activities --- self-care, walking, climbing stairs- beginning the day after surgery.   Gradually increase activities as tolerated.   Control your pain to be active.   Stop when you are tired.   Ideally, walk several times a day, eventually an hour a day.   Most people are back to most day-to-day activities in a few weeks.  It takes 4-8 weeks to get back to unrestricted, intense activity. If you can walk 30 minutes without difficulty, it is safe to try more intense activity such as jogging, treadmill, bicycling, low-impact aerobics, swimming, etc. Save the most intensive and strenuous activity for last (Usually 4-8 weeks after surgery) such as sit-ups, heavy lifting, contact sports, etc.    Refrain from any intense heavy lifting or straining until you are off narcotics for pain control.  You will have off days, but things should improve week-by-week. DO NOT PUSH THROUGH PAIN.   Let pain be your guide: If it hurts to do something, don't do it.  Pain is your body warning you to avoid that activity for another week until the pain goes down.   May shower / Bathe   Complete by: As directed    May walk up steps   Complete by: As directed    Sexual Activity Restrictions   Complete by: As directed    You may have sexual intercourse when it is comfortable. If it hurts to do something, stop.       Allergies as of 03/02/2022       Reactions   Dilaudid [hydromorphone] Itching   Tolerates morphine fine        Medication List  STOP taking these medications    naproxen 500 MG tablet Commonly known as: NAPROSYN       TAKE these medications    albuterol 108 (90 Base) MCG/ACT inhaler Commonly known as: VENTOLIN HFA Inhale 2 puffs into the lungs every 6 (six) hours as needed for wheezing or shortness of breath.   amoxicillin 500 MG capsule Commonly known as: AMOXIL Take 1 capsule (500 mg total) by mouth 3 (three) times daily.   cyclobenzaprine 10 MG tablet Commonly known as: FLEXERIL Take 1 tablet (10 mg total) by mouth at bedtime.   gabapentin 300 MG capsule Commonly known as: NEURONTIN Take 1 capsule (300 mg total) by mouth 3 (three) times daily. Increase to 4x/day as needed   ibuprofen 800 MG tablet Commonly known as: ADVIL Take 800 mg by mouth every 8 (eight) hours as needed.   levothyroxine 88 MCG tablet Commonly known as: SYNTHROID Take 1 tablet (88 mcg total) by mouth daily before breakfast.   polyethylene glycol 17 g packet Commonly known as: MIRALAX / GLYCOLAX Take 17 g by mouth 2 (two) times daily as needed for moderate constipation or severe constipation.   tiZANidine 4 MG tablet Commonly known as: Zanaflex Take 1 tablet (4 mg total) by  mouth at bedtime.   traMADol 50 MG tablet Commonly known as: ULTRAM Take 1-2 tablets (50-100 mg total) by mouth every 6 (six) hours as needed for moderate pain or severe pain.               Discharge Care Instructions  (From admission, onward)           Start     Ordered   02/27/22 0000  Discharge wound care:       Comments: In general, it is encouraged that you remove your dressing and packing, shower with soap & water, and replace your dressing once a day.   .   Eventually your body will heal & pull the open wound closed over the next few months.  Raw open wounds will occasionally bleed or secrete yellow drainage until it heals closed.   Pressure on the dressing for 30 minutes will stop most wound bleeding Drain sites will drain a little until the drain is removed.   02/27/22 1009            Significant Diagnostic Studies:  Results for orders placed or performed during the hospital encounter of 02/27/22 (from the past 72 hour(s))  Basic metabolic panel     Status: Abnormal   Collection Time: 02/28/22  4:47 AM  Result Value Ref Range   Sodium 140 135 - 145 mmol/L   Potassium 3.6 3.5 - 5.1 mmol/L   Chloride 109 98 - 111 mmol/L   CO2 25 22 - 32 mmol/L   Glucose, Bld 128 (H) 70 - 99 mg/dL    Comment: Glucose reference range applies only to samples taken after fasting for at least 8 hours.   BUN 6 6 - 20 mg/dL   Creatinine, Ser 2.11 0.44 - 1.00 mg/dL   Calcium 8.9 8.9 - 94.1 mg/dL   GFR, Estimated >74 >08 mL/min    Comment: (NOTE) Calculated using the CKD-EPI Creatinine Equation (2021)    Anion gap 6 5 - 15    Comment: Performed at [facility], [address] [place], [place] [address]  CBC     Status: Abnormal   Collection Time: 02/28/22  4:47 AM  Result Value Ref Range   WBC 13.7 (  H) 4.0 - 10.5 K/uL   RBC 4.32 3.87 - 5.11 MIL/uL   Hemoglobin 13.5 12.0 - 15.0 g/dL   HCT 40.9 81.1 - 91.4 %   MCV 96.3 80.0 - 100.0 fL   MCH 31.3 26.0 -  34.0 pg   MCHC 32.5 30.0 - 36.0 g/dL   RDW 78.2 95.6 - 21.3 %   Platelets 181 150 - 400 K/uL   nRBC 0.0 0.0 - 0.2 %    Comment: Performed at [facility], [address] [place], [place] [address]  Magnesium     Status: None   Collection Time: 02/28/22  4:47 AM  Result Value Ref Range   Magnesium 1.9 1.7 - 2.4 mg/dL    Comment: Performed at [facility], [address] [place], [place] [address]    No results found.  Past Medical History:  Diagnosis Date   Acute diverticulitis 08/07/2019   Anemia    history of anemia   Anxiety    Asthma    inhaler as needed   Chest pain of uncertain etiology    intermittent left side chest pain   Chlamydia    Depression    Diverticulitis 2019   Diverticulitis of intestine with abscess 07/13/2021   Endometriosis    Fecal peritonitis (HCC) 09/18/2021   Genital herpes    Headache    MIGRAINES   Hypertension    no meds since April   Hypothyroidism    Intra-abdominal abscess s/p robotic drainage 09/18/2021 09/18/2021   Obese    Obesity, Class III, BMI 40-49.9 (morbid obesity) (HCC) 11/06/2011   PCOS (polycystic ovarian syndrome)    PONV (postoperative nausea and vomiting)    Shortness of breath    on exertion from weight    Past Surgical History:  Procedure Laterality Date   COLONOSCOPY WITH PROPOFOL N/A 02/13/2022   Procedure: COLONOSCOPY WITH PROPOFOL;  Surgeon: Rachael Fee, MD;  Location: [facility];  Service: Gastroenterology;  Laterality: N/A;   DILATION AND CURETTAGE OF UTERUS N/A 04/29/2018   Procedure: DILATATION AND CURETTAGE;  Surgeon: Allie Bossier, MD;  Location: [facility];  Service: Gynecology;  Laterality: N/A;   HYSTEROSCOPY WITH D & C N/A 04/06/2014   Procedure: DILATATION AND CURETTAGE /HYSTEROSCOPY ;  Surgeon: Tereso Newcomer, MD;  Location: [facility];  Service: Gynecology;  Laterality: N/A;   LAPAROSCOPY N/A 04/29/2018   Procedure: LAPAROSCOPY DIAGNOSTIC WITH PERITONEAL BIOPSY;   Surgeon: Allie Bossier, MD;  Location: [facility];  Service: Gynecology;  Laterality: N/A;   LEFT HEART CATH AND CORONARY ANGIOGRAPHY N/A 12/12/2016   Procedure: Left Heart Cath and Coronary Angiography;  Surgeon: Peter M Swaziland, MD;  Location: [facility];  Service: Cardiovascular;  Laterality: N/A;   LYSIS OF ADHESION N/A 02/27/2022   Procedure: LYSIS OF ADHESION;  Surgeon: Karie Soda, MD;  Location: [facility];  Service: General;  Laterality: N/A;   PROCTOSCOPY N/A 02/27/2022   Procedure: RIGID PROCTOSCOPY;  Surgeon: Karie Soda, MD;  Location: [facility];  Service: General;  Laterality: N/A;   WISDOM TOOTH EXTRACTION     XI ROBOTIC ASSISTED COLOSTOMY TAKEDOWN N/A 02/27/2022   Procedure: ROBOTIC OSTOMY TAKEDOWN;  Surgeon: Karie Soda, MD;  Location: [facility];  Service: General;  Laterality: N/A;    Social History   Socioeconomic History   Marital status: Single    Spouse name: Not on file   Number of children: 0   Years of education: Not on file  Highest education level: Not on file  Occupational History   Occupation: [employer] student  Tobacco Use   Smoking status: Some Days    Packs/day: 0.25    Types: Cigarettes   Smokeless tobacco: Never   Tobacco comments:    2-3 cigerettes a week.  Vaping Use   Vaping Use: Some days   Substances: Nicotine, Flavoring  Substance and Sexual Activity   Alcohol use: Not Currently    Alcohol/week: 0.0 standard drinks    Comment: occasonal    Drug use: Not Currently    Types: Marijuana    Comment: last use 3 months ago   Sexual activity: Yes    Birth control/protection: None  Other Topics Concern   Not on file  Social History Narrative   Not on file   Social Determinants of Health   Financial Resource Strain: Not on file  Food Insecurity: Not on file  Transportation Needs: Not on file  Physical Activity: Not on file  Stress: Not on file  Social Connections: Not on file  Intimate Partner Violence: Not on file    Family History   Problem Relation Age of Onset   Heart disease Mother    Sleep apnea Mother    Depression Mother    Hypertension Mother    Kidney disease Mother    Cancer Father    Diabetes Father    Heart disease Father    Colon cancer Father        40s   Hypertension Father    Diabetes Sister    Hypertension Sister    Heart disease Maternal Grandmother    Esophageal cancer Maternal Grandmother    Diabetes Maternal Grandfather    Heart disease Maternal Grandfather    Colon cancer Cousin        died age 57, paternal cousin   Stomach cancer Neg Hx    Colon polyps Neg Hx     Current Facility-Administered Medications  Medication Dose Route Frequency Provider Last Rate Last Admin   acetaminophen (TYLENOL) tablet 1,000 mg  1,000 mg Oral Q6H Karie Soda, MD   1,000 mg at 03/02/22 0515   albuterol (PROVENTIL) (2.5 MG/3ML) 0.083% nebulizer solution 3 mL  3 mL Inhalation Q6H PRN Karie Soda, MD       alum & mag hydroxide-simeth (MAALOX/MYLANTA) 200-200-20 MG/5ML suspension 30 mL  30 mL Oral Q6H PRN Karie Soda, MD       alvimopan (ENTEREG) capsule 12 mg  12 mg Oral BID Karie Soda, MD       amoxicillin (AMOXIL) capsule 500 mg  500 mg Oral Trixie Deis, MD   500 mg at 03/02/22 0515   diphenhydrAMINE (BENADRYL) 12.5 MG/5ML elixir 12.5 mg  12.5 mg Oral Q6H PRN Karie Soda, MD       Or   diphenhydrAMINE (BENADRYL) injection 12.5 mg  12.5 mg Intravenous Q6H PRN Karie Soda, MD       enalaprilat (VASOTEC) injection 0.625-1.25 mg  0.625-1.25 mg Intravenous Q6H PRN Karie Soda, MD       enoxaparin (LOVENOX) injection 40 mg  40 mg Subcutaneous Q24H Karie Soda, MD   40 mg at 03/02/22 0738   feeding supplement (ENSURE SURGERY) liquid 237 mL  237 mL Oral BID BM Karie Soda, MD   237 mL at 03/01/22 1005   gabapentin (NEURONTIN) capsule 300 mg  300 mg Oral TID Karie Soda, MD   300 mg at 03/01/22 2117   hydrALAZINE (APRESOLINE) injection 10 mg  10 mg  Intravenous Q2H PRN Karie Soda, MD        levothyroxine (SYNTHROID) tablet 88 mcg  88 mcg Oral QAC breakfast Karie Soda, MD   88 mcg at 03/02/22 0515   lip balm (CARMEX) ointment 1 application.  1 application. Topical BID Karie Soda, MD   1 application. at 03/01/22 2122   magic mouthwash  15 mL Oral QID PRN Karie Soda, MD       melatonin tablet 3 mg  3 mg Oral QHS PRN Karie Soda, MD       methocarbamol (ROBAXIN) tablet 1,000 mg  1,000 mg Oral Q6H PRN Karie Soda, MD   1,000 mg at 03/01/22 1810   metoprolol tartrate (LOPRESSOR) injection 5 mg  5 mg Intravenous Q6H PRN Karie Soda, MD       morphine (PF) 2 MG/ML injection 2-4 mg  2-4 mg Intravenous Q3H PRN Romie Levee, MD   2 mg at 03/01/22 2117   ondansetron (ZOFRAN) tablet 4 mg  4 mg Oral Q6H PRN Karie Soda, MD       Or   ondansetron [facility]) injection 4 mg  4 mg Intravenous Q6H PRN Karie Soda, MD   4 mg at 03/01/22 1945   polycarbophil (FIBERCON) tablet 625 mg  625 mg Oral BID Karie Soda, MD   625 mg at 03/01/22 2117   prochlorperazine (COMPAZINE) tablet 10 mg  10 mg Oral Q6H PRN Karie Soda, MD       Or   prochlorperazine (COMPAZINE) injection 5-10 mg  5-10 mg Intravenous Q6H PRN Karie Soda, MD       simethicone [facility]) chewable tablet 40 mg  40 mg Oral Q6H PRN Karie Soda, MD   40 mg at 03/01/22 1946   tiZANidine (ZANAFLEX) tablet 4 mg  4 mg Oral Laurena Slimmer, MD   4 mg at 03/01/22 2118   traMADol (ULTRAM) tablet 50-100 mg  50-100 mg Oral Q6H PRN Karie Soda, MD   100 mg at 03/01/22 1946     Allergies  Allergen Reactions   Dilaudid [Hydromorphone] Itching    Tolerates morphine fine    Signed: Lorenso Courier, MD, FACS, MASCRS Esophageal, Gastrointestinal & Colorectal Surgery Robotic and Minimally Invasive Surgery  [facility], [facility]  [facility]  [address] [place], [place] [address] [phone] Fax [phone] Main  CONTACT  INFORMATION:  Weekday (9AM-5PM): Call [facility] main office at [phone]  Weeknight (5PM-9AM) or Weekend/Holiday: Check [email or web address] (password " [email or web address]") for General Surgery [facility] coverage  (Please, do not use SecureChat as it is not reliable communication to operating surgeons for immediate patient care)      03/02/2022, 9:30 AM

## 2022-03-03 ENCOUNTER — Telehealth: Payer: Self-pay

## 2022-03-03 ENCOUNTER — Other Ambulatory Visit: Payer: Self-pay | Admitting: Nurse Practitioner

## 2022-03-03 LAB — SURGICAL PATHOLOGY

## 2022-03-03 MED ORDER — LEVOTHYROXINE SODIUM 88 MCG PO TABS: 88.0000 ug | ORAL_TABLET | Freq: Every day | ORAL | 6 refills | Status: DC

## 2022-03-03 NOTE — Telephone Encounter (Signed)
Transition Care Management Follow-up Telephone Call Date of discharge and from where: 03/02/2022, [facility] How have you been since you were released from the hospital? She said she is doing okay, still in a lot of pain.  Needs to pick up the tramadol. Has been taking ibuprofen and tylenol for pain.  Any questions or concerns? No  Items Reviewed: Did the pt receive and understand the discharge instructions provided? Yes  Medications obtained and verified?  She said that she has all of her medications except the tramadol and synthroid. She plans to have someone pick up the tramadol for her today.  She needs a refill of synthroid.  Other? No  Any new allergies since your discharge? No  Dietary orders reviewed? Yes Do you have support at home? Yes  - she is staying with her sister  [place] and Equipment/Supplies: Were home health services ordered? no If so, what is the name of the agency? N/a  Has the agency set up a time to come to the patient's home? not applicable Were any new equipment or medical supplies ordered?  Yes: gauze for wound care What is the name of the medical supply agency? She received them from the hospital Were you able to get the supplies/equipment? She said she ran out and her sister will need to purchase some more. Her sister has been changing the gauze as needed  Do you have any questions related to the use of the equipment or supplies? No  Functional Questionnaire: (I = Independent and D = Dependent) ADLs: independent   Follow up appointments reviewed:  PCP Hospital f/u appt confirmed? Yes  Scheduled to see Geryl Rankins, NP - 03/14/2022 Specialist Hospital f/u appt confirmed?  Her follow up with the surgeon has not been scheduled yet.  She has an appointment with GYN- 03/27/2022.    Are transportation arrangements needed? No  If their condition worsens, is the pt aware to call PCP or go to the Emergency Dept.? Yes Was the patient provided with contact  information for the PCP's office or ED? Yes Was to pt encouraged to call back with questions or concerns? Yes

## 2022-03-03 NOTE — Telephone Encounter (Signed)
Rx sent earlier today by her PCP.

## 2022-03-14 ENCOUNTER — Ambulatory Visit: Payer: Medicaid Other | Admitting: Nurse Practitioner

## 2022-03-14 NOTE — Progress Notes (Signed)
Pelvic   GYNECOLOGY OFFICE VISIT NOTE  History:   Lori Mcknight is a 35 y.o. G0P0000 here today for amenorrhea and reports "endometriosis flares". She actually notes she doesn't think she has had a flare in some time but since her surgeries has had LLQ pain that has persisted. She was told she has scar tissue and had an infection around her ovary likely due to the diverticular abscess.   She notes amenorrhea since December 2022 just after her surgery. This is not a new issue for her - she has had amenorrhea in the past treated with Provera.  She saw Dr. Sabra Heck in February 2022 for this issue. She has a known history of hypothyroidism - she has not been taking her medication because she ran out.   Lori has a long history of endometriosis and had surgery for it in 2019 with Dr. Hulan Fray - diagnosed with Stage 1 endometriosis. She had not had flares for some time.    Of note she also recently had a colostomy placed in 09/2021 and the ostomy take down and LOA surgery happened robotically 02/27/2022. She had this original surgery due to a diverticulitis with abscess.   She denies any abnormal vaginal discharge or other concerns.     Past Medical History:  Diagnosis Date   Acute diverticulitis 08/07/2019   Anemia    history of anemia   Anxiety    Asthma    inhaler as needed   Chest pain of uncertain etiology    intermittent left side chest pain   Chlamydia    Depression    Diverticulitis 2019   Diverticulitis of intestine with abscess 07/13/2021   Endometriosis    Fecal peritonitis ([place]) 09/18/2021   Genital herpes    Headache    MIGRAINES   Hypertension    no meds since April   Hypothyroidism    Intra-abdominal abscess s/p robotic drainage 09/18/2021 09/18/2021   Obese    Obesity, Class III, BMI 40-49.9 (morbid obesity) ([place]) 11/06/2011   PCOS (polycystic ovarian syndrome)    PONV (postoperative nausea and vomiting)    Shortness of breath    on exertion from weight    Past Surgical  History:  Procedure Laterality Date   COLONOSCOPY WITH PROPOFOL N/A 02/13/2022   Procedure: COLONOSCOPY WITH PROPOFOL;  Surgeon: Milus Banister, MD;  Location: [facility];  Service: Gastroenterology;  Laterality: N/A;   DILATION AND CURETTAGE OF UTERUS N/A 04/29/2018   Procedure: DILATATION AND CURETTAGE;  Surgeon: [name], MD;  Location: [facility];  Service: Gynecology;  Laterality: N/A;   HYSTEROSCOPY WITH D & C N/A 04/06/2014   Procedure: DILATATION AND CURETTAGE /HYSTEROSCOPY ;  Surgeon: Osborne Oman, MD;  Location: [facility];  Service: Gynecology;  Laterality: N/A;   LAPAROSCOPY N/A 04/29/2018   Procedure: LAPAROSCOPY DIAGNOSTIC WITH PERITONEAL BIOPSY;  Surgeon: [name], MD;  Location: [facility];  Service: Gynecology;  Laterality: N/A;   LEFT HEART CATH AND CORONARY ANGIOGRAPHY N/A 12/12/2016   Procedure: Left Heart Cath and Coronary Angiography;  Surgeon: Peter M Martinique, MD;  Location: [facility];  Service: Cardiovascular;  Laterality: N/A;   LYSIS OF ADHESION N/A 02/27/2022   Procedure: LYSIS OF ADHESION;  Surgeon: [name], MD;  Location: [facility];  Service: General;  Laterality: N/A;   PROCTOSCOPY N/A 02/27/2022   Procedure: RIGID PROCTOSCOPY;  Surgeon: [name], MD;  Location: [facility];  Service: General;  Laterality: N/A;   WISDOM TOOTH EXTRACTION  XI ROBOTIC ASSISTED COLOSTOMY TAKEDOWN N/A 02/27/2022   Procedure: ROBOTIC OSTOMY TAKEDOWN;  Surgeon: [name], MD;  Location: [facility];  Service: General;  Laterality: N/A;    The following portions of the patient's history were reviewed and updated as appropriate: allergies, current medications, past family history, past medical history, past social history, past surgical history and problem list.   Health Maintenance:   Normal pap and negative HRHPV on 08/2020.   Diagnosis  Date Value Ref Range Status  09/11/2020   Final   - Negative for intraepithelial lesion or malignancy (NILM)    Review of Systems:   Pertinent items noted in HPI and remainder of comprehensive ROS otherwise negative.  Physical Exam:  BP 127/67   Pulse 90   Ht 5\' 6"  (1.676 m)   Wt (!) 362 lb (164.2 kg)   LMP 02/27/2022 (Exact Date)   BMI 58.43 kg/m  CONSTITUTIONAL: Well-developed, well-nourished female in no acute distress.  HEENT:  Normocephalic, atraumatic. External right and left ear normal. No scleral icterus.  NECK: Normal range of motion, supple, no masses noted on observation SKIN: No rash noted. Not diaphoretic. No erythema. No pallor. MUSCULOSKELETAL: Normal range of motion. No edema noted. NEUROLOGIC: Alert and oriented to person, place, and time. Normal muscle tone coordination. No cranial nerve deficit noted. PSYCHIATRIC: Normal mood and affect. Normal behavior. Normal judgment and thought content.  CARDIOVASCULAR: Normal heart rate noted RESPIRATORY: Effort and breath sounds normal, no problems with respiration noted ABDOMEN: No masses noted. No other overt distention noted.    PELVIC: Deferred  Labs and Imaging Results for orders placed or performed in visit on 03/18/22 (from the past 168 hour(s))  Pregnancy, urine POC   Collection Time: 03/18/22 10:05 AM  Result Value Ref Range   Preg Test, Ur NEGATIVE NEGATIVE   No results found.  Assessment and Plan:   1. Endometriosis s/p ablation - Reviewed difficult to have endometriosis flares when amenorrheic which is reassuring. Could be MSK due to surgery and inflammation. Could be scar tissue and for this we would not recommend more surgery unless other treatment options were unsuccessful - Given history, we will check TVUS to cover our bases - Ucx sent to for discomfort with urination - We also discussed role of PFPT if all testing negative and still in pain - Reviewed surgery as last resort - she agrees - she does not want surgery at this time but understands would have her see MIGS if the case.  -     Urine Culture -     US PELVIC COMPLETE WITH  TRANSVAGINAL; Future  2. Amenorrhea - UPT negative 5/18  - TSH in December was 15.4 (Improved from 39 in July). Last normal TSH was 4 years ago. Will recheck today  and recommend follow up with PCP.  - Provera refilled  - We will also check routine labs for PCOS and other causes of amenorrhea. UPT negative -     TSH -     levothyroxine (SYNTHROID) 88 MCG tablet; Take 1 tablet (88 mcg total) by mouth daily before breakfast. -     medroxyPROGESTERone (PROVERA) 10 MG tablet; Take 1 tablet (10 mg total) by mouth daily. -     17-Hydroxyprogesterone -     Estradiol -     Follicle stimulating hormone -     Luteinizing hormone -     Prolactin -     Testosterone,Free and Total -     Progesterone  3. Hypothyroidism,  unspecified type -     TSH -     levothyroxine (SYNTHROID) 88 MCG tablet; Take 1 tablet (88 mcg total) by mouth daily before breakfast.   Routine preventative health maintenance measures emphasized. Pap smear is up to date.  Please refer to After Visit Summary for other counseling recommendations.   No follow-ups on file.  Radene Gunning, MD, [place] for [facility], [place]

## 2022-03-18 ENCOUNTER — Ambulatory Visit (INDEPENDENT_AMBULATORY_CARE_PROVIDER_SITE_OTHER): Payer: Self-pay | Admitting: Obstetrics and Gynecology

## 2022-03-18 ENCOUNTER — Encounter: Payer: Self-pay | Admitting: Obstetrics and Gynecology

## 2022-03-18 VITALS — BP 127/67 | HR 90 | Ht 66.0 in | Wt 362.0 lb

## 2022-03-18 DIAGNOSIS — Z3202 Encounter for pregnancy test, result negative: Secondary | ICD-10-CM

## 2022-03-18 DIAGNOSIS — N912 Amenorrhea, unspecified: Secondary | ICD-10-CM

## 2022-03-18 DIAGNOSIS — E039 Hypothyroidism, unspecified: Secondary | ICD-10-CM

## 2022-03-18 DIAGNOSIS — N809 Endometriosis, unspecified: Secondary | ICD-10-CM

## 2022-03-18 LAB — POCT PREGNANCY, URINE: Preg Test, Ur: NEGATIVE

## 2022-03-18 MED ORDER — LEVOTHYROXINE SODIUM 88 MCG PO TABS: 88.0000 ug | ORAL_TABLET | Freq: Every day | ORAL | 6 refills | Status: DC

## 2022-03-18 MED ORDER — MEDROXYPROGESTERONE ACETATE 10 MG PO TABS: 10.0000 mg | ORAL_TABLET | Freq: Every day | ORAL | 5 refills | Status: DC

## 2022-03-20 LAB — TSH: TSH: 24.3 u[IU]/mL — ABNORMAL HIGH (ref 0.450–4.500)

## 2022-03-20 LAB — LUTEINIZING HORMONE: LH: 7.2 m[IU]/mL

## 2022-03-20 LAB — PROLACTIN: Prolactin: 12.7 ng/mL (ref 4.8–23.3)

## 2022-03-20 LAB — TESTOSTERONE,FREE AND TOTAL
Testosterone, Free: 0.4 pg/mL (ref 0.0–4.2)
Testosterone: 14 ng/dL (ref 8–60)

## 2022-03-20 LAB — ESTRADIOL: Estradiol: 31.1 pg/mL

## 2022-03-20 LAB — FOLLICLE STIMULATING HORMONE: FSH: 7.9 m[IU]/mL

## 2022-03-20 LAB — URINE CULTURE

## 2022-03-20 LAB — 17-HYDROXYPROGESTERONE: 17-OH Progesterone LCMS: 26 ng/dL

## 2022-03-20 LAB — PROGESTERONE: Progesterone: 0.1 ng/mL

## 2022-03-24 ENCOUNTER — Telehealth: Payer: Self-pay

## 2022-03-24 NOTE — Telephone Encounter (Signed)
Patient has either canceled or no showed with me since 08-2020

## 2022-03-24 NOTE — Telephone Encounter (Addendum)
-----   Message from Radene Gunning, MD sent at 03/24/2022 10:13 AM EDT ----- She needs an appointment with her PCP asap. Can you please reach out and get her in? I did refill her synthroid, but she is going to need close follow up for this.   First available appt with [facility] is 04/25/22 due to patient volume. Called pt to review. Pt plans to pick up Synthroid on 03/27/22 when she has money available. Agreeable to follow up appt with PCP.

## 2022-03-27 ENCOUNTER — Ambulatory Visit: Payer: Medicaid Other | Admitting: Medical

## 2022-03-31 ENCOUNTER — Other Ambulatory Visit: Payer: Medicaid Other

## 2022-04-10 ENCOUNTER — Ambulatory Visit: Admission: RE | Admit: 2022-04-10 | Payer: Medicaid Other | Source: Ambulatory Visit

## 2022-04-25 ENCOUNTER — Inpatient Hospital Stay: Payer: Medicaid Other | Admitting: Nurse Practitioner

## 2022-05-06 ENCOUNTER — Encounter (HOSPITAL_COMMUNITY): Payer: Self-pay | Admitting: Nurse Practitioner

## 2022-06-11 ENCOUNTER — Ambulatory Visit: Payer: Medicaid Other | Admitting: Nurse Practitioner

## 2023-03-11 IMAGING — CT CT ABD-PELV W/ CM
2 of 4 series · 16 of 46 positions shown, 18 images · IV contrast (APPLIED)
Comparison: 05/20/2021

CLINICAL DATA: Pelvic pain and urinary incontinence

EXAM:
CT ABDOMEN AND PELVIS WITH CONTRAST
TECHNIQUE: Multidetector CT imaging of the abdomen and pelvis was performed
using the standard protocol following bolus administration of
intravenous contrast.
CONTRAST:  100mL OMNIPAQUE IOHEXOL 350 MG/ML SOLN

[Series 2: abd pel w · axial · 0.88mm/px · z∈[-659,-224]mm · 13 of 95 slices shown, 15 images]
[im 4/95  soft-tissue]
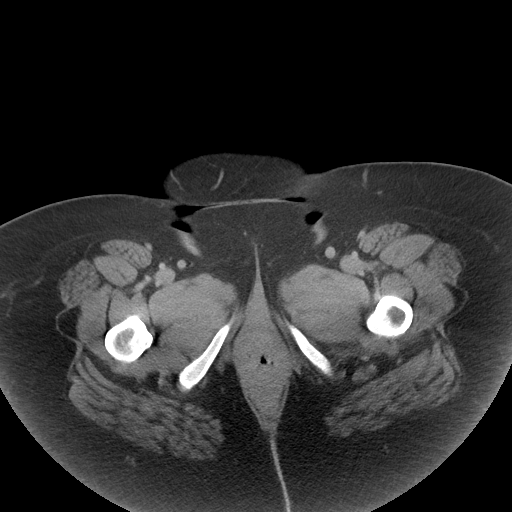
[im 4/95  bone]
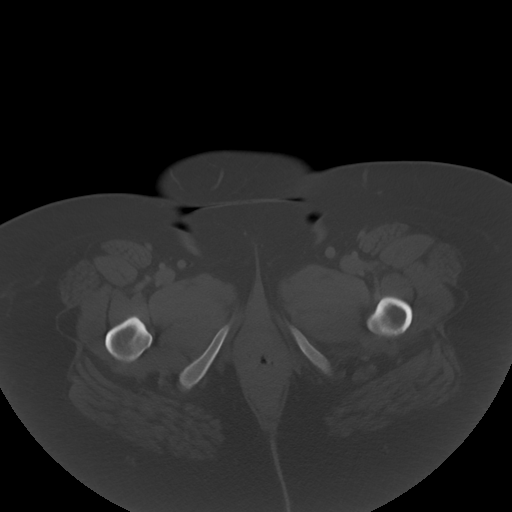
[im 12/95  soft-tissue]
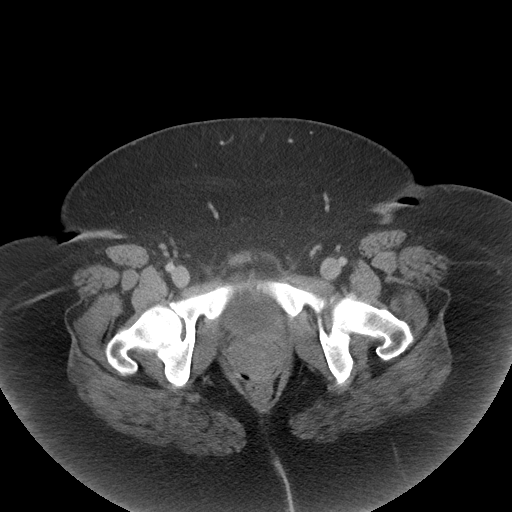
[im 20/95  soft-tissue]
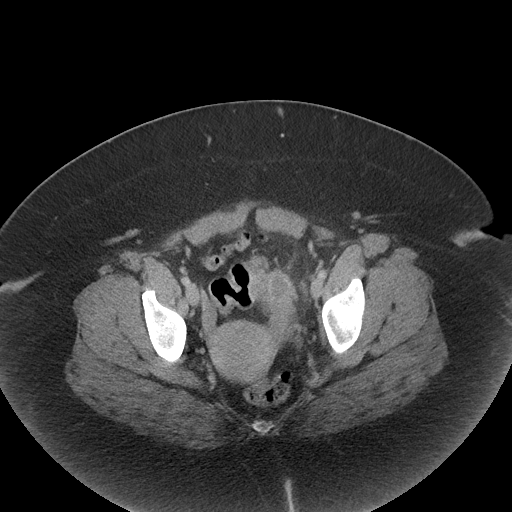
[im 28/95  soft-tissue]
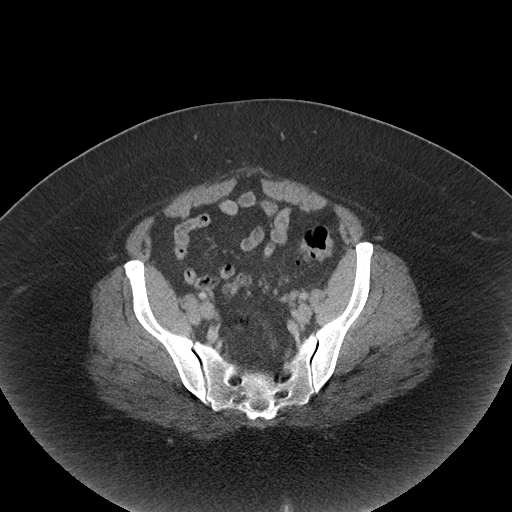
[im 32/95  soft-tissue]
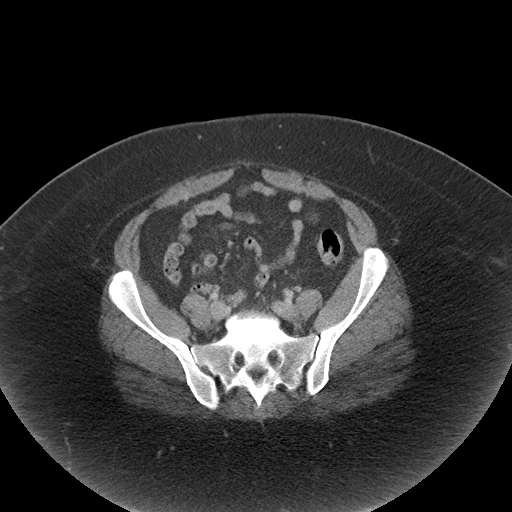
[im 40/95  soft-tissue]
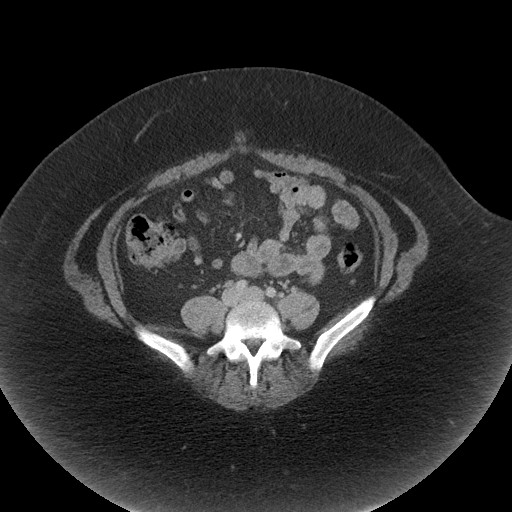
[im 48/95  soft-tissue]
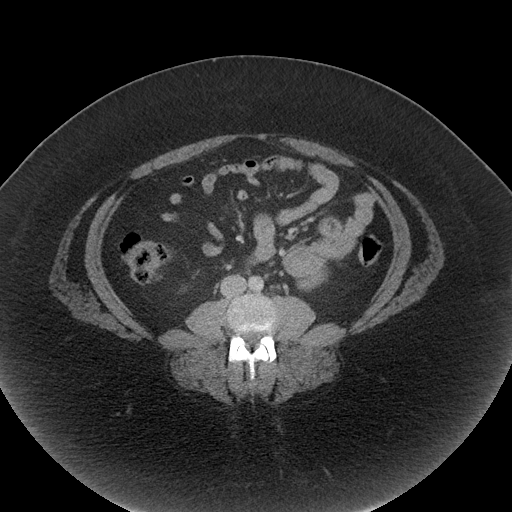
[im 55/95  soft-tissue]
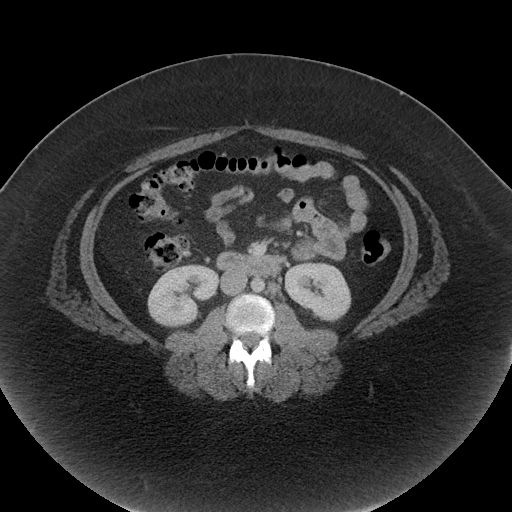
[im 63/95  soft-tissue]
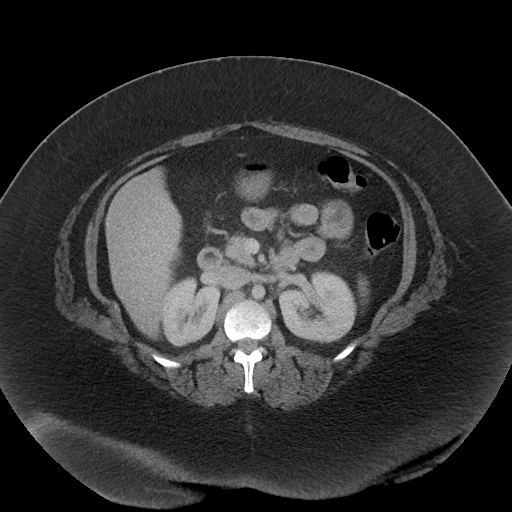
[im 63/95  bone]
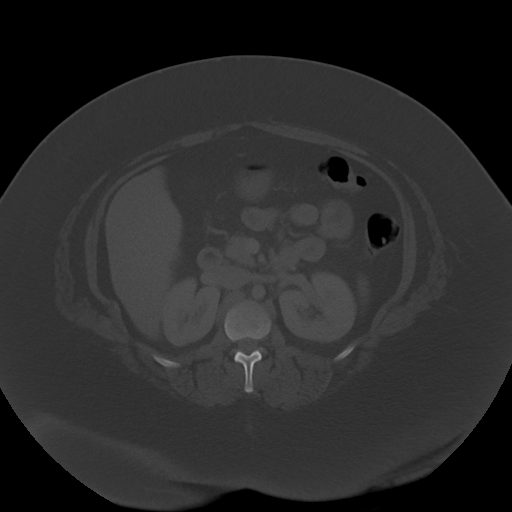
[im 67/95  soft-tissue]
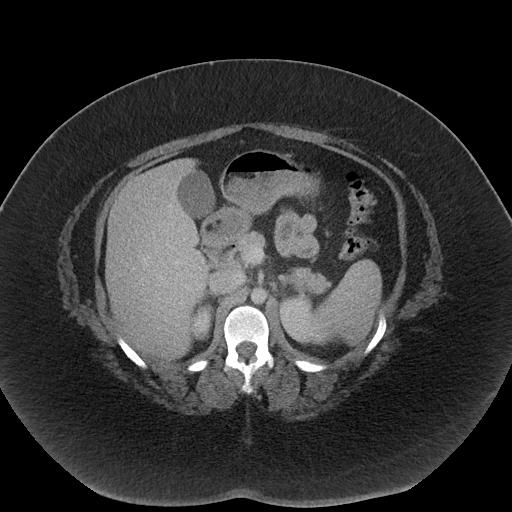
[im 75/95  soft-tissue]
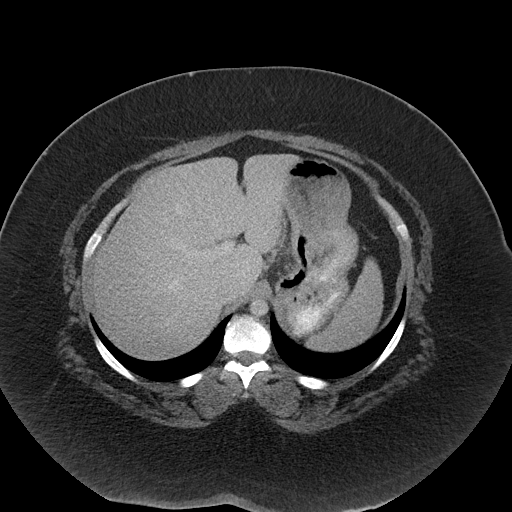
[im 83/95  soft-tissue]
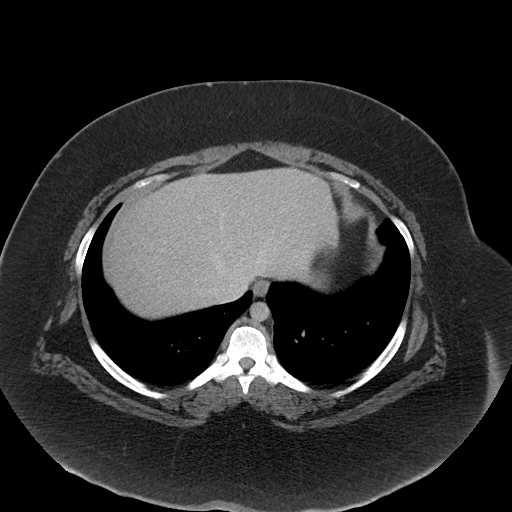
[im 91/95  soft-tissue]
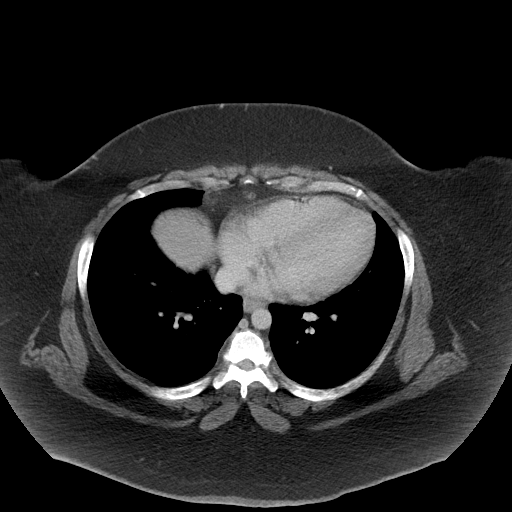

[Series 5: coronal · coronal · 0.90mm/px · 3 of 138 slices shown]
[im 46/138  soft-tissue]
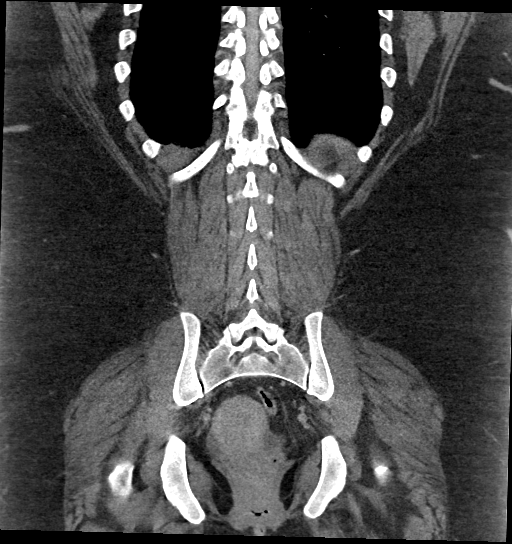
[im 61/138  soft-tissue]
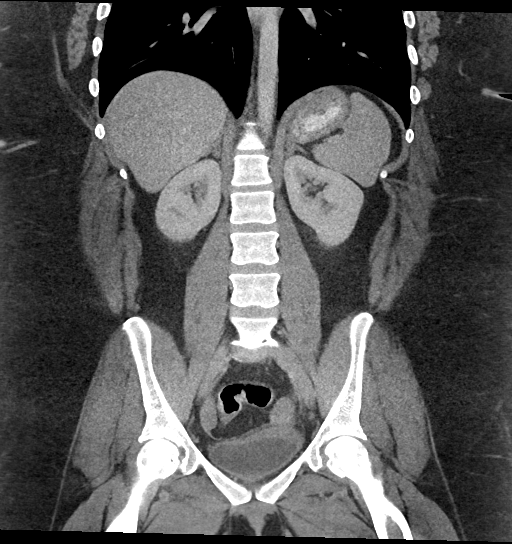
[im 77/138  soft-tissue]
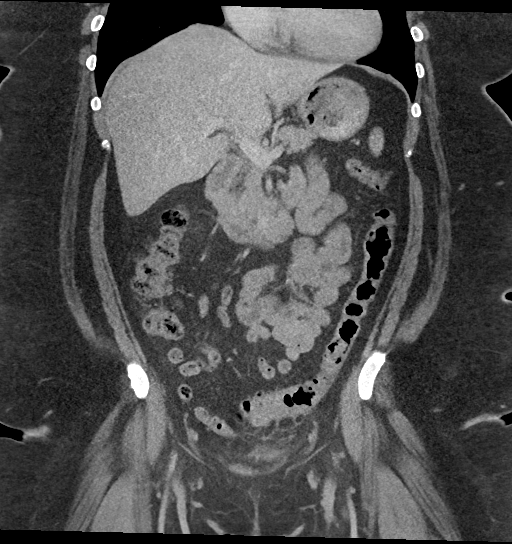

[16 of 46 positions shown; findings below may reference images not displayed]

FINDINGS: Lower chest: No acute abnormality.

Hepatobiliary: No focal liver abnormality is seen. No gallstones,
gallbladder wall thickening, or biliary dilatation.

Pancreas: Unremarkable. No pancreatic ductal dilatation or
surrounding inflammatory changes.

Spleen: Normal in size without focal abnormality.

Adrenals/Urinary Tract: Adrenal glands are stable in appearance.
Kidneys demonstrate a normal enhancement pattern bilaterally. No
obstructive changes are seen. Ureters are within normal limits. The
bladder is partially distended.

Stomach/Bowel: There are changes again seen consistent with
diverticulitis in the sigmoid colon with smooth muscle hypertrophy
and pericolonic fluid collection which now measures 2.3 cm in
greatest dimension. It indents upon the superior aspect of the
urinary bladder although no fistula is identified. These changes
have worsened in the interval from the prior exam at which time the
small abscess measured 1 cm. More proximal colon appears within
normal limits. The appendix is unremarkable. Small bowel and stomach
are unremarkable.

Vascular/Lymphatic: No significant vascular findings are present. No
enlarged abdominal or pelvic lymph nodes.

Reproductive: Uterus and bilateral adnexa are unremarkable.

Other: No abdominal wall hernia or abnormality. No abdominopelvic
ascites.

Musculoskeletal: No acute or significant osseous findings.
IMPRESSION: Changes of diverticulitis with a focal abscess adjacent to the colon
which is increased in size now measuring 2.3 cm. It indents upon the
superior aspect of the urinary bladder and focal bladder wall
thickening is noted although no fistula is seen.

## 2023-04-15 ENCOUNTER — Other Ambulatory Visit: Payer: Self-pay | Admitting: Obstetrics and Gynecology

## 2023-04-15 DIAGNOSIS — N912 Amenorrhea, unspecified: Secondary | ICD-10-CM

## 2023-04-15 DIAGNOSIS — E039 Hypothyroidism, unspecified: Secondary | ICD-10-CM

## 2023-04-26 IMAGING — CT CT ABD-PELV W/ CM
2 of 4 series · 16 of 46 positions shown, 18 images · IV contrast (APPLIED)
Comparison: 07/13/2021

CLINICAL DATA: Chest pain, shortness of breath, abdominal infection
suspected

EXAM:
CT ABDOMEN AND PELVIS WITH CONTRAST
TECHNIQUE: Multidetector CT imaging of the abdomen and pelvis was performed
using the standard protocol following bolus administration of
intravenous contrast.
CONTRAST:  100mL OMNIPAQUE IOHEXOL 350 MG/ML SOLN

[Series 2: abd pel w · axial · 0.98mm/px · z∈[+758,+1218]mm · 13 of 102 slices shown, 15 images]
[im 5/102  soft-tissue]
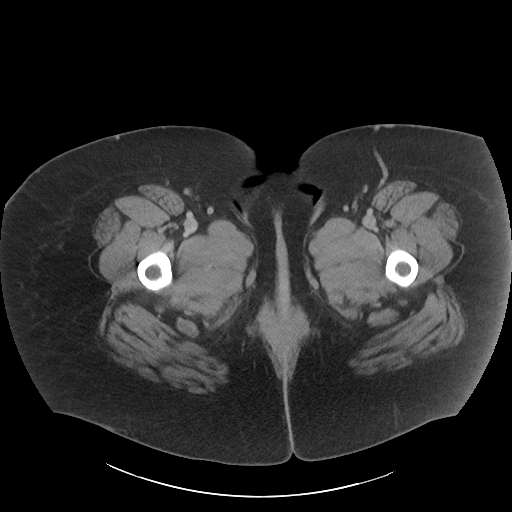
[im 5/102  bone]
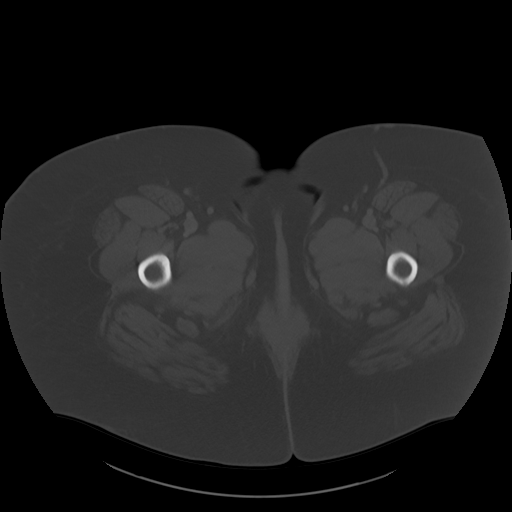
[im 14/102  soft-tissue]
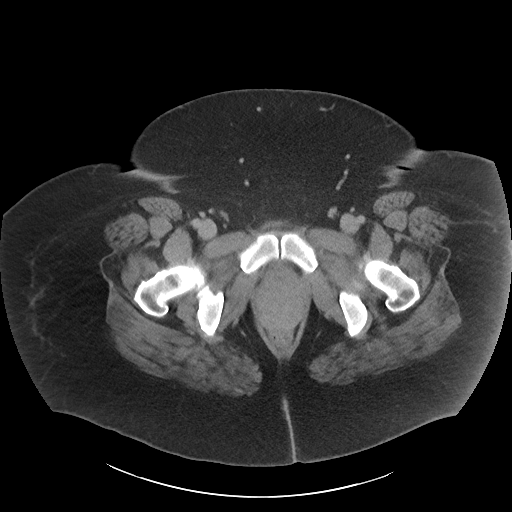
[im 22/102  soft-tissue]
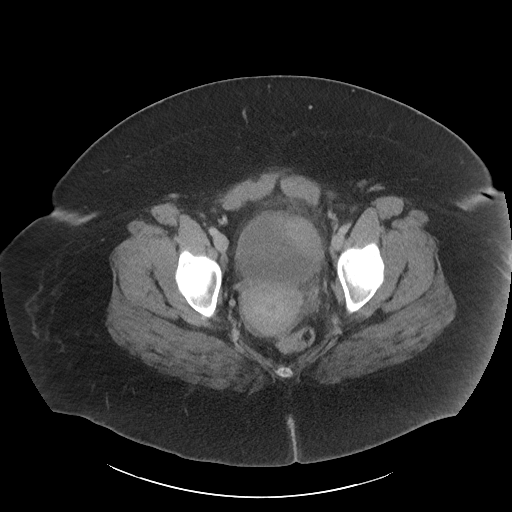
[im 27/102  soft-tissue]
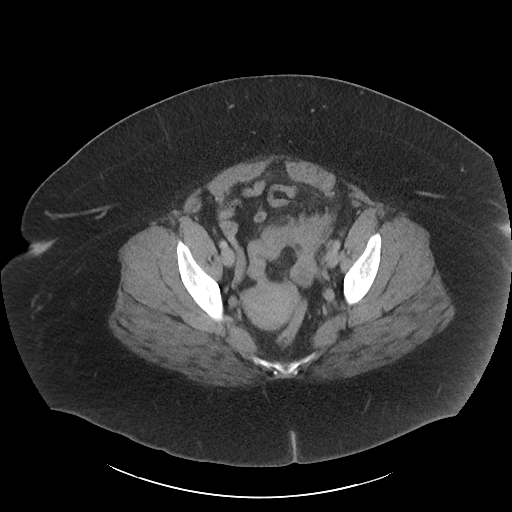
[im 36/102  soft-tissue]
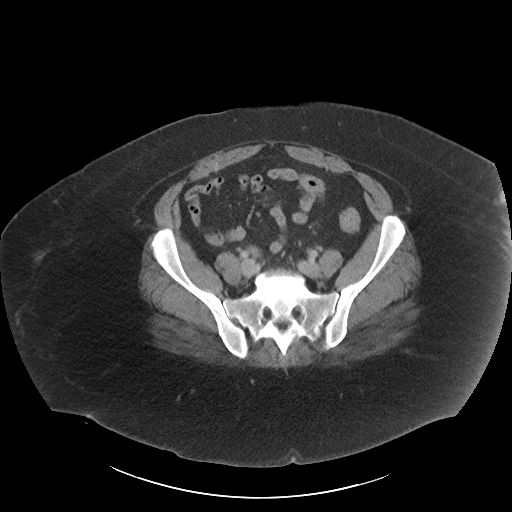
[im 44/102  soft-tissue]
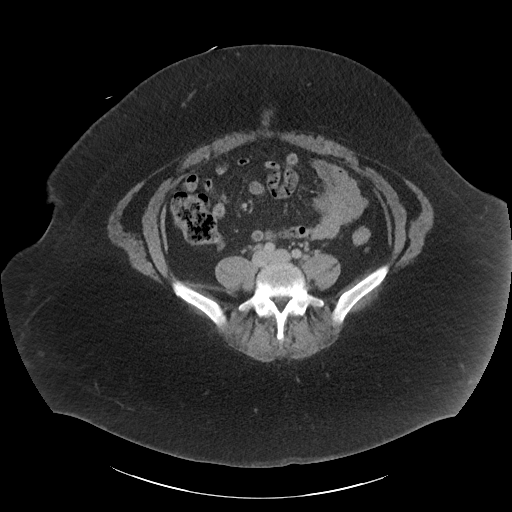
[im 53/102  soft-tissue]
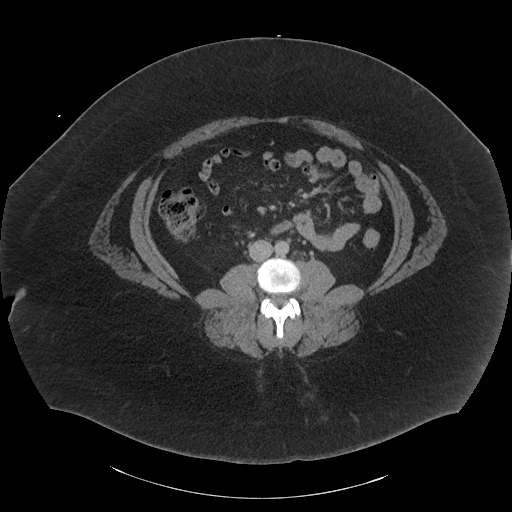
[im 58/102  soft-tissue]
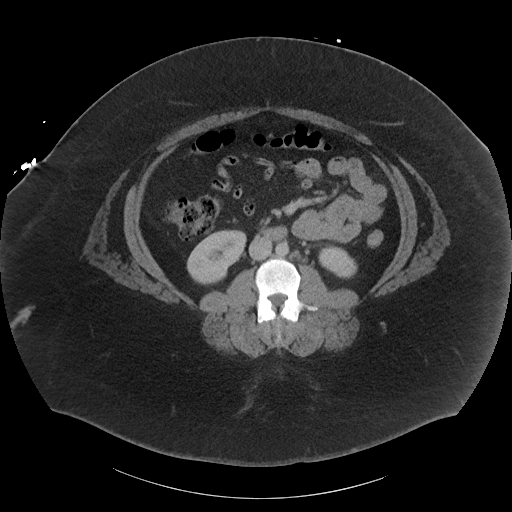
[im 66/102  soft-tissue]
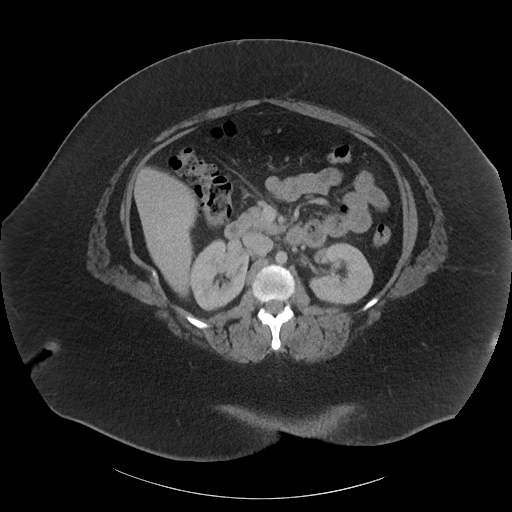
[im 66/102  bone]
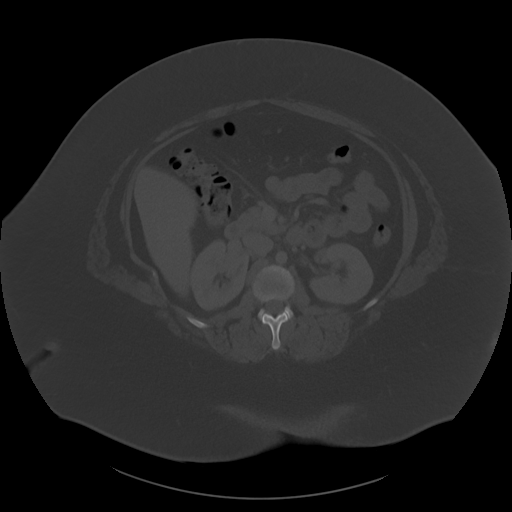
[im 75/102  soft-tissue]
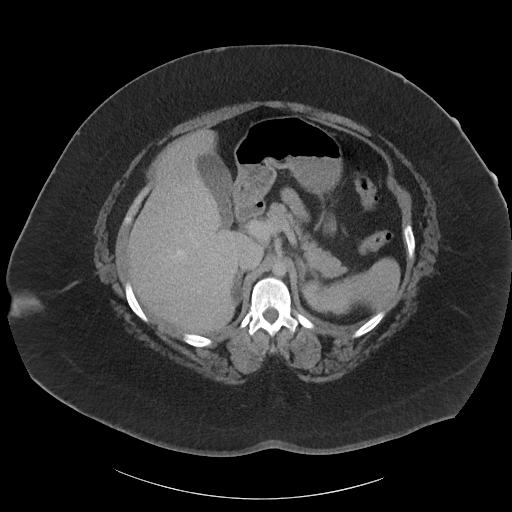
[im 80/102  soft-tissue]
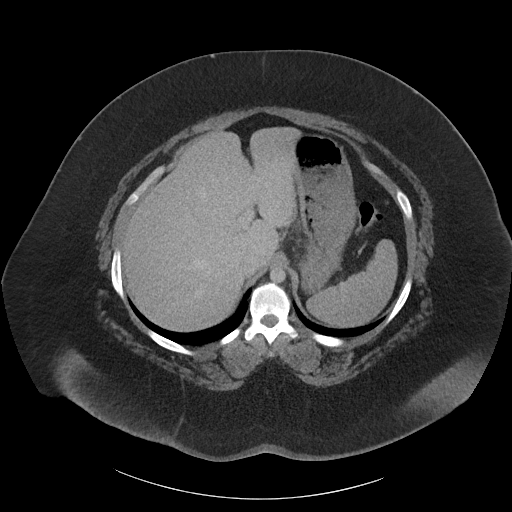
[im 88/102  soft-tissue]
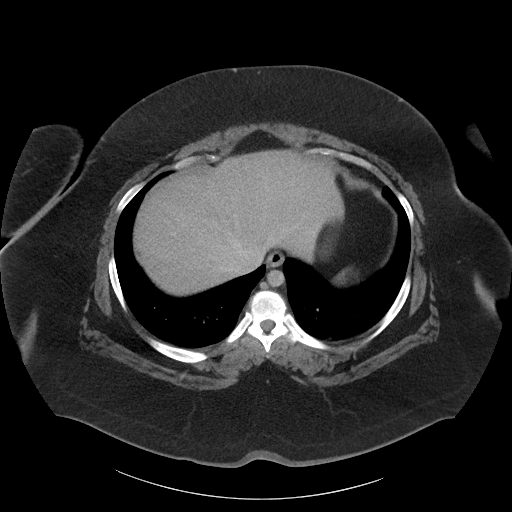
[im 97/102  soft-tissue]
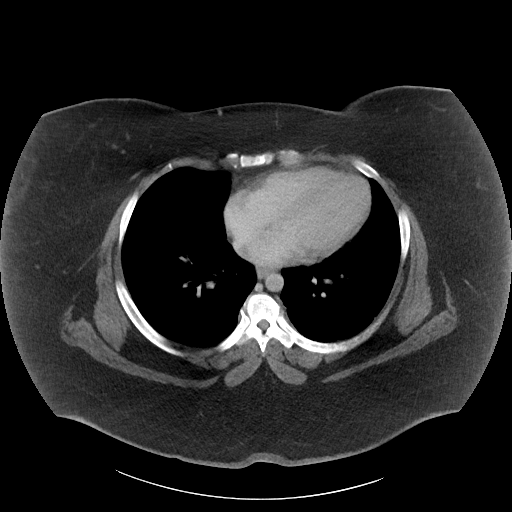

[Series 5: coronal · coronal · 0.82mm/px · 3 of 139 slices shown]
[im 47/139  soft-tissue]
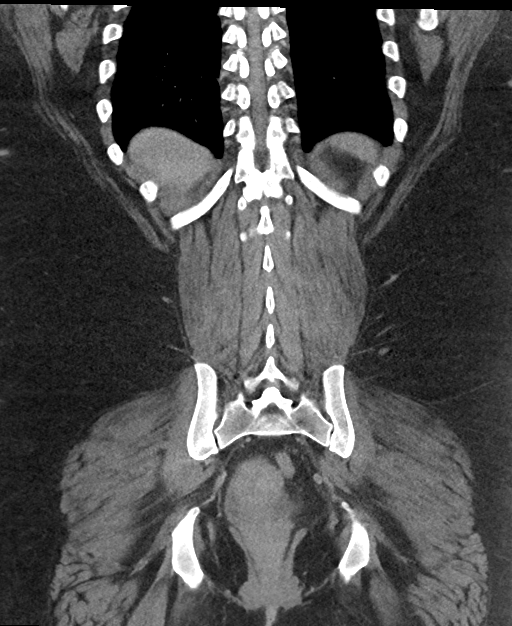
[im 62/139  soft-tissue]
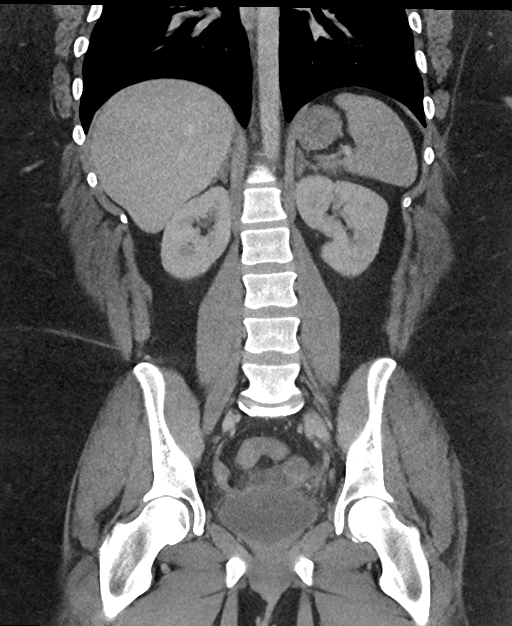
[im 77/139  soft-tissue]
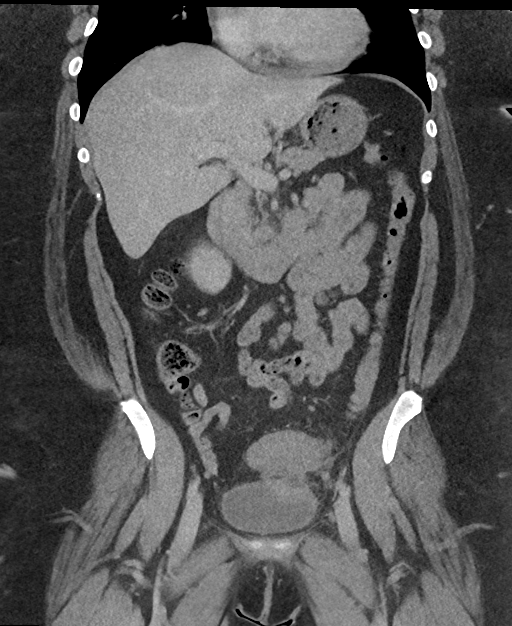

[16 of 46 positions shown; findings below may reference images not displayed]

FINDINGS: Lower chest: The lung bases are clear. No pleural effusion or
pericardial effusion.

Hepatobiliary: No focal liver abnormality is seen. No gallstones,
gallbladder wall thickening, or biliary dilatation.

Pancreas: Unremarkable. No pancreatic ductal dilatation or
surrounding inflammatory changes.

Spleen: Normal in size without focal abnormality.

Adrenals/Urinary Tract: Adrenal glands are unremarkable. Kidneys are
normal, without renal calculi, focal lesion, or hydronephrosis.
Thickening of the superior wall of the bladder, measuring up to 13
mm, similar to the prior exam, likely reactive secondary to the
adjacent fluid collection. No definite visualized seen between the
fluid collection in the bladder.

Stomach/Bowel: Redemonstrated changes related to sigmoid
diverticulitis, with redemonstrated pericolonic fluid collection,
which now measures up to 1.9 x 1.0 x 1.2 cm (AP x TR x CC) (series
6, image 81 and series 2, image 79), previously 2.2 x 1.6 x 1.8 cm
when remeasured similarly. Colon is otherwise within normal limits.

Vascular/Lymphatic: No significant vascular findings are present. No
enlarged abdominal or pelvic lymph nodes.

Reproductive: Uterus and bilateral adnexa are unremarkable.

Other: No abdominal wall hernia or abnormality. No abdominopelvic
ascites.

Musculoskeletal: No acute or significant osseous findings.
IMPRESSION: Redemonstrated changes of diverticulitis, with a small abscess
adjacent to the sigmoid colon, which is decreased in size compared
to prior exam. The abscess is adjacent to the superior surface of
the bladder, with focal bladder wall thickening, likely reactive,
without definite fistula.

No other acute process in the abdomen or pelvis.

## 2023-04-26 IMAGING — DX DG CHEST 2V
2 series · 2 of 2 positions shown · non-contrast
Comparison: Chest radiograph 01/06/2017

CLINICAL DATA: Shortness of breath

EXAM:
CHEST - 2 VIEW

[chest pa]
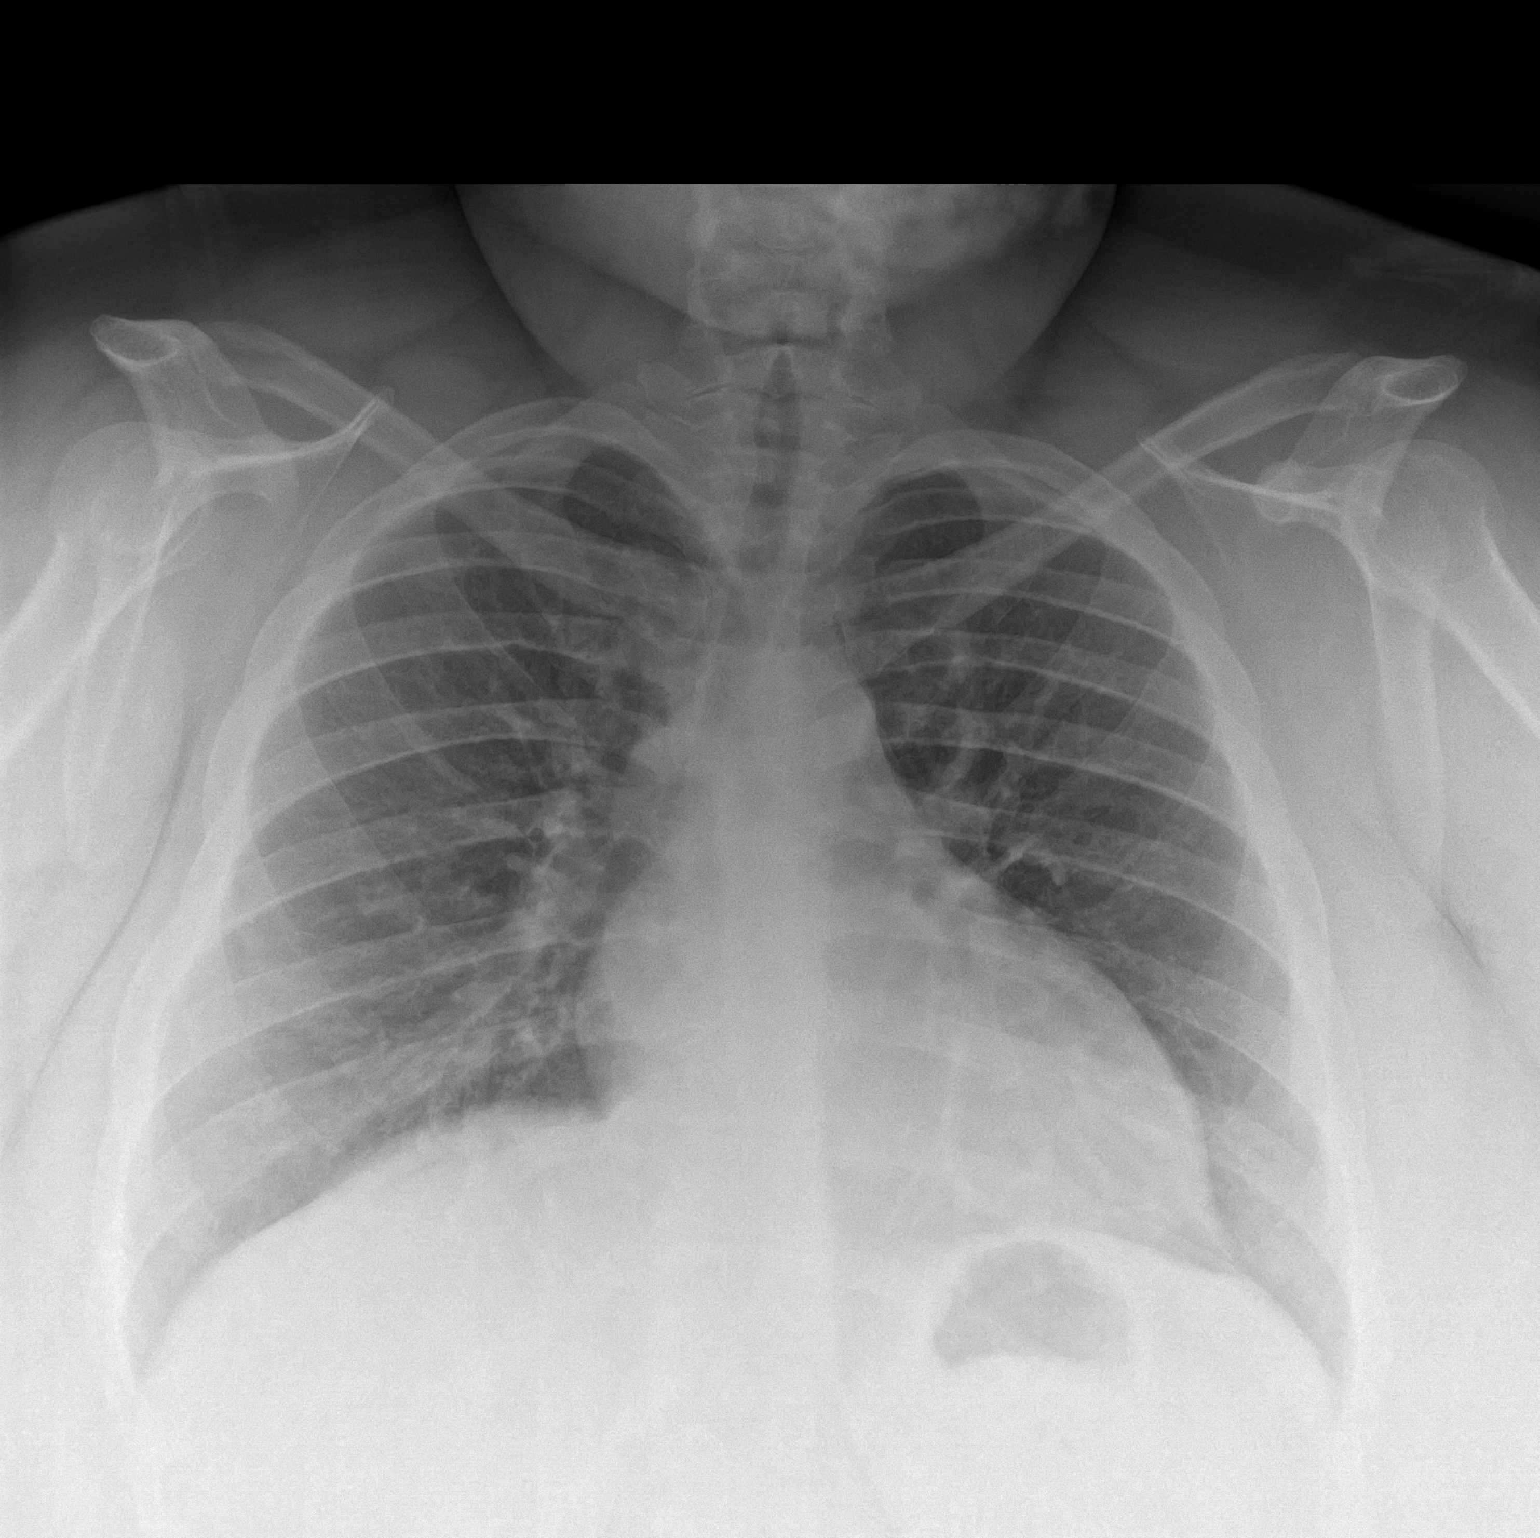

[chest lat]
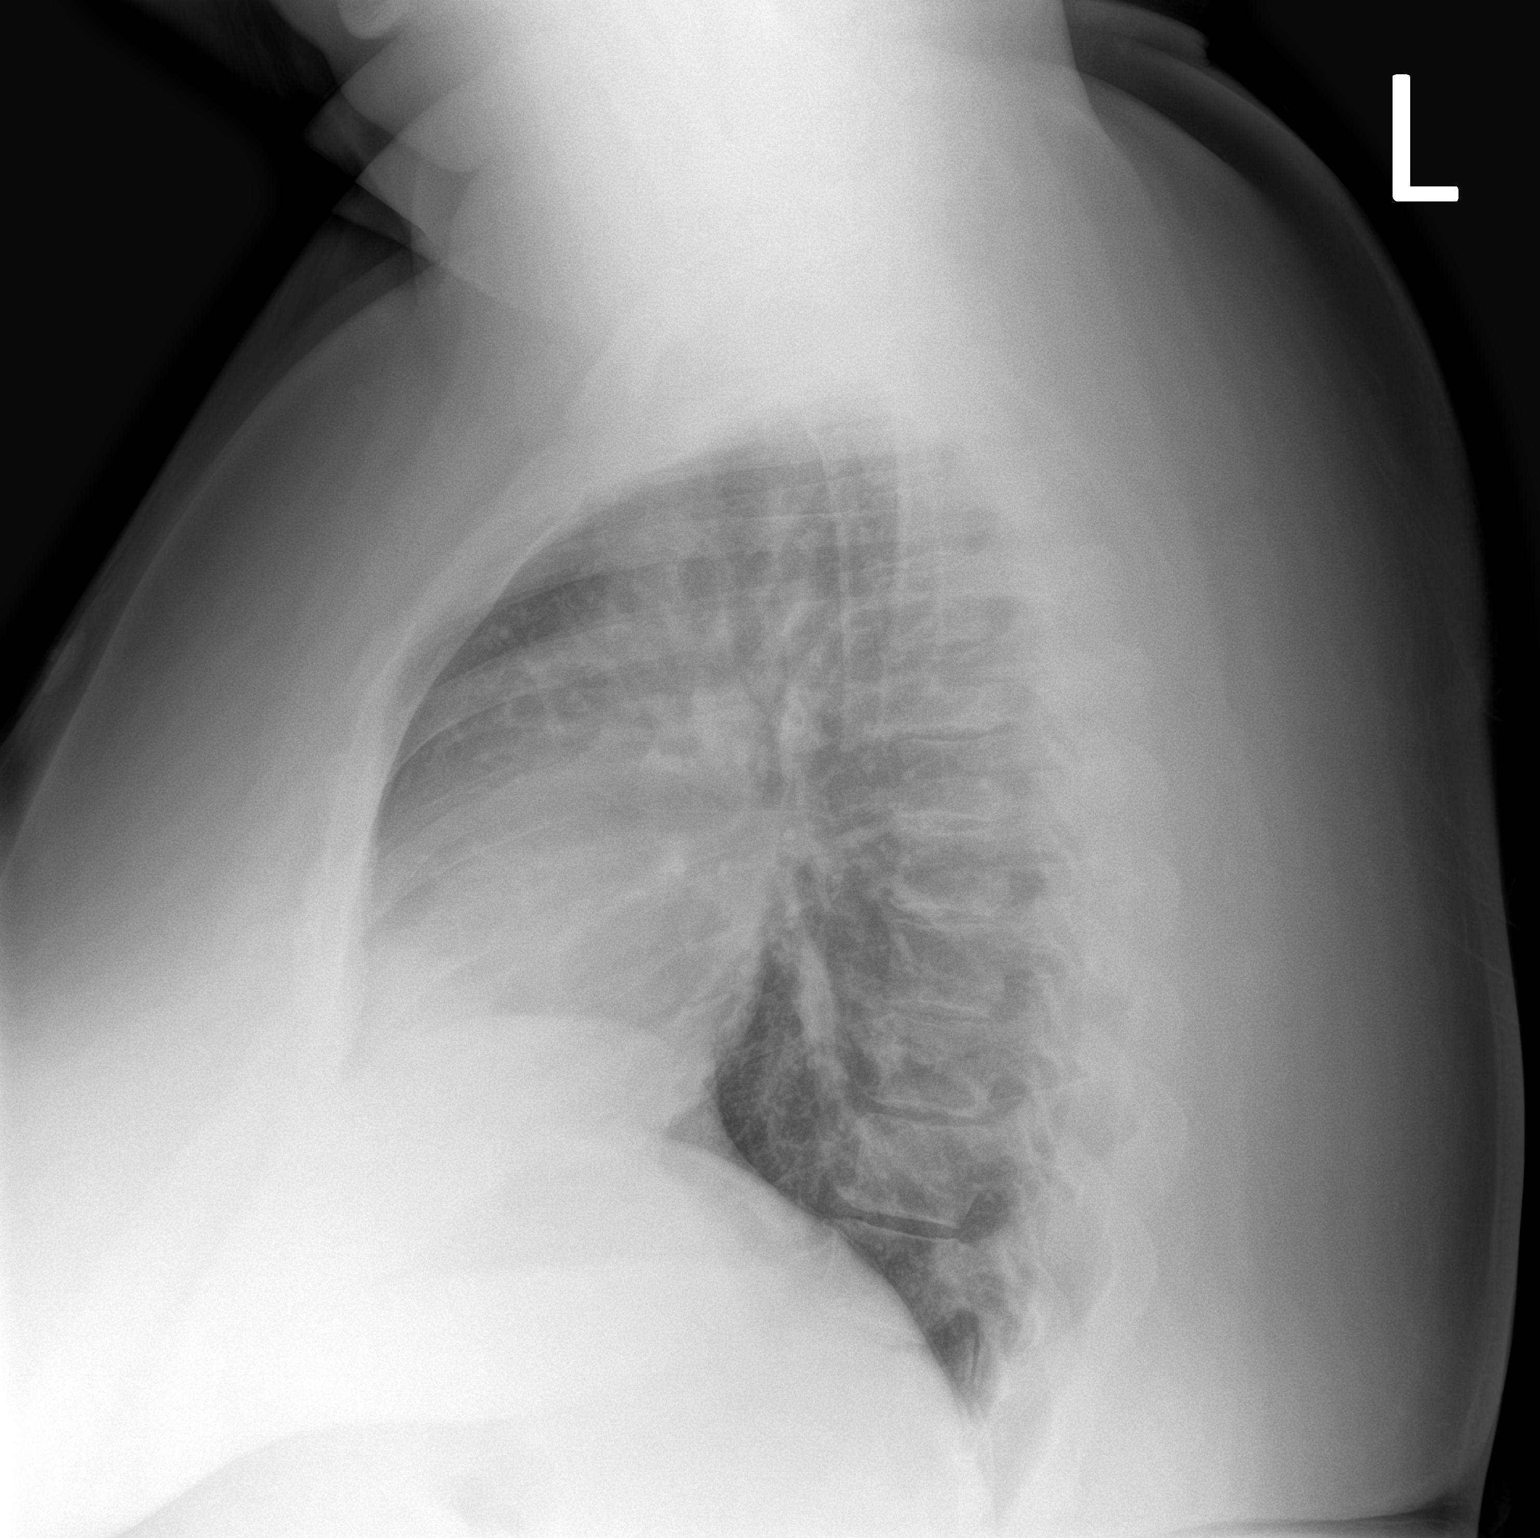

[2 of 2 positions shown; findings below may reference images not displayed]

FINDINGS: The cardiomediastinal silhouette is within normal limits.

There is no focal consolidation or pulmonary edema. There is no
pleural effusion or pneumothorax.

There is no acute osseous abnormality.
IMPRESSION: No radiographic evidence of acute cardiopulmonary process.

## 2023-05-06 ENCOUNTER — Telehealth: Payer: Self-pay | Admitting: Family Medicine

## 2023-05-06 NOTE — Telephone Encounter (Signed)
I tried contacting the patient to schedule an appointment. I left a message about an available slot tomorrow afternoon. Additionally, there are open appointments in August. However, if those don't work, the next available slots would be in September or October.

## 2023-05-13 ENCOUNTER — Encounter: Payer: Self-pay | Admitting: Obstetrics and Gynecology

## 2023-05-13 ENCOUNTER — Ambulatory Visit (INDEPENDENT_AMBULATORY_CARE_PROVIDER_SITE_OTHER): Payer: Medicaid Other | Admitting: Obstetrics and Gynecology

## 2023-05-13 VITALS — BP 138/100 | HR 95 | Ht 66.0 in | Wt 375.6 lb

## 2023-05-13 DIAGNOSIS — R102 Pelvic and perineal pain: Secondary | ICD-10-CM | POA: Diagnosis not present

## 2023-05-13 DIAGNOSIS — N809 Endometriosis, unspecified: Secondary | ICD-10-CM

## 2023-05-13 DIAGNOSIS — Z3189 Encounter for other procreative management: Secondary | ICD-10-CM

## 2023-05-13 NOTE — Progress Notes (Signed)
  Cc: endometriosis pain Subjective:    Patient ID: Lori Mcknight, female    DOB: July 25, 1986, 37 y.o.   MRN: [id number]  HPI 36 yo G0 seen for discussion of endometriosis pain and infertility.  Pt has laparoscopy proven endometriosis.  She never received any treatment regimen due to desiring pregnancy.  Over the last few months, she has noted slight increase in pain especially in the "cul de sac" area.  Pt also desires fertility.  Patient also has extensive surgical history due to perforated diverticulosis involving the left fallopian tube as well.  Discussed with patient fertility and treatment of endometriosis will be exclusive to each other.  Pt desires to focus on fertility at this time.   Review of Systems     Objective:   Physical Exam Vitals:   05/13/23 1346 05/13/23 1352  BP: (!) 145/92 (!) 138/100  Pulse: 96 95         Assessment & Plan:   1. Pelvic pain in female  - Ambulatory referral to Endocrinology  2. Endometriosis s/p ablation  - Ambulatory referral to Endocrinology  3. Encounter for fertility planning Due to multiple medical issues including endometriosis, extensive surgical history, obesity and age, pt will be seen by fertility specialist in consultation. Once pt has fulfilled her desires regarding fertility she can then pursue endometriosis treatment.  - Ambulatory referral to Endocrinology  I spent 20 minutes dedicated to the care of this patient including previsit review of records, face to face time with the patient discussing treatment options and post visit testing.   Warden Fillers, MD Faculty Attending, [facility] for [facility]

## 2023-05-14 ENCOUNTER — Encounter: Payer: Self-pay | Admitting: Obstetrics and Gynecology

## 2023-05-14 ENCOUNTER — Other Ambulatory Visit: Payer: Self-pay | Admitting: Nurse Practitioner

## 2023-05-14 DIAGNOSIS — E039 Hypothyroidism, unspecified: Secondary | ICD-10-CM

## 2023-05-14 DIAGNOSIS — N912 Amenorrhea, unspecified: Secondary | ICD-10-CM

## 2023-05-19 ENCOUNTER — Encounter: Payer: Self-pay | Admitting: Obstetrics and Gynecology

## 2023-05-21 ENCOUNTER — Other Ambulatory Visit: Payer: Self-pay | Admitting: *Deleted

## 2023-05-21 DIAGNOSIS — R102 Pelvic and perineal pain: Secondary | ICD-10-CM

## 2023-05-21 DIAGNOSIS — N809 Endometriosis, unspecified: Secondary | ICD-10-CM

## 2023-05-21 NOTE — Addendum Note (Signed)
Addended by: Gerome Apley on: 05/21/2023 10:45 AM   Modules accepted: Orders

## 2023-06-03 ENCOUNTER — Ambulatory Visit (HOSPITAL_COMMUNITY)
Admission: RE | Admit: 2023-06-03 | Discharge: 2023-06-03 | Disposition: A | Discharge status: Home / Self Care | Payer: Medicaid Other | Source: Ambulatory Visit | Attending: Obstetrics and Gynecology | Admitting: Obstetrics and Gynecology

## 2023-06-03 DIAGNOSIS — R102 Pelvic and perineal pain: Secondary | ICD-10-CM | POA: Insufficient documentation

## 2023-06-03 DIAGNOSIS — N809 Endometriosis, unspecified: Secondary | ICD-10-CM | POA: Insufficient documentation

## 2023-06-03 DIAGNOSIS — D252 Subserosal leiomyoma of uterus: Secondary | ICD-10-CM | POA: Diagnosis not present

## 2023-06-08 ENCOUNTER — Encounter: Payer: Self-pay | Admitting: Obstetrics and Gynecology

## 2023-06-22 ENCOUNTER — Encounter: Payer: Self-pay | Admitting: Obstetrics and Gynecology

## 2023-09-06 DIAGNOSIS — H5213 Myopia, bilateral: Secondary | ICD-10-CM | POA: Diagnosis not present

## 2023-10-16 ENCOUNTER — Ambulatory Visit: Payer: Medicaid Other | Attending: Nurse Practitioner | Admitting: Nurse Practitioner

## 2023-10-16 ENCOUNTER — Ambulatory Visit: Payer: Medicaid Other | Admitting: Nurse Practitioner

## 2023-10-16 ENCOUNTER — Telehealth: Payer: Self-pay

## 2023-10-16 ENCOUNTER — Encounter: Payer: Self-pay | Admitting: Nurse Practitioner

## 2023-10-16 VITALS — BP 138/87 | HR 81 | Resp 20 | Ht 66.0 in | Wt 385.6 lb

## 2023-10-16 DIAGNOSIS — Z139 Encounter for screening, unspecified: Secondary | ICD-10-CM | POA: Diagnosis not present

## 2023-10-16 DIAGNOSIS — G8929 Other chronic pain: Secondary | ICD-10-CM

## 2023-10-16 DIAGNOSIS — D72829 Elevated white blood cell count, unspecified: Secondary | ICD-10-CM | POA: Diagnosis not present

## 2023-10-16 DIAGNOSIS — E039 Hypothyroidism, unspecified: Secondary | ICD-10-CM

## 2023-10-16 DIAGNOSIS — Z6841 Body Mass Index (BMI) 40.0 and over, adult: Secondary | ICD-10-CM | POA: Diagnosis not present

## 2023-10-16 DIAGNOSIS — M5442 Lumbago with sciatica, left side: Secondary | ICD-10-CM | POA: Diagnosis not present

## 2023-10-16 DIAGNOSIS — M5441 Lumbago with sciatica, right side: Secondary | ICD-10-CM

## 2023-10-16 DIAGNOSIS — I10 Essential (primary) hypertension: Secondary | ICD-10-CM

## 2023-10-16 DIAGNOSIS — R7309 Other abnormal glucose: Secondary | ICD-10-CM

## 2023-10-16 LAB — GLUCOSE, POCT (MANUAL RESULT ENTRY): POC Glucose: 145 mg/dL — AB (ref 70–99)

## 2023-10-16 MED ORDER — BLOOD PRESSURE MONITOR DEVI: 0 refills | Status: DC

## 2023-10-16 MED ORDER — TIZANIDINE HCL 4 MG PO TABS: 4.0000 mg | ORAL_TABLET | Freq: Every day | ORAL | 1 refills | Status: DC

## 2023-10-16 MED ORDER — GABAPENTIN 300 MG PO CAPS: 300.0000 mg | ORAL_CAPSULE | Freq: Three times a day (TID) | ORAL | 3 refills | Status: DC

## 2023-10-16 MED ORDER — LEVOTHYROXINE SODIUM 112 MCG PO TABS: 112.0000 ug | ORAL_TABLET | Freq: Every day | ORAL | 1 refills | Status: DC

## 2023-10-16 MED ORDER — CHLORTHALIDONE 25 MG PO TABS: 25.0000 mg | ORAL_TABLET | Freq: Every day | ORAL | 1 refills | Status: DC

## 2023-10-16 NOTE — Patient Instructions (Signed)
Call [facility] for your blood pressure machine [phone]

## 2023-10-16 NOTE — Progress Notes (Signed)
   Telephone encounter was:  Successful.  Complex Care Management Note Care Guide Note  10/16/2023 Name: Kamarri C Gilmartin MRN: [id number] DOB: October 18, 1985  Alyza C Rhine is a 38 y.o. year old female who is a primary care patient of Theotis Haze ORN, NP . The community resource team was consulted for assistance with Transportation Needs , Food Insecurity, and Financial Difficulties related to Financial strain  SDOH screenings and interventions completed:  Yes  SDOH Interventions Today    Flowsheet Row Most Recent Value  SDOH Interventions   Utilities Interventions Community Resources Provided        Care guide performed the following interventions: Patient provided with information about care guide support team and interviewed to confirm resource needs.Patient needs to reapply for her food stamps and she will be going online to apply for the [employer] program. I will be maiing community resources to the patient for [facility].   Follow Up Plan:  No further follow up planned at this time. The patient has been provided with needed resources.  Encounter Outcome:  Patient Visit Completed    Jon Colt [facility]  [facility], [facility] Guide, Phone: [phone] Website: [email or web address]

## 2023-10-16 NOTE — Progress Notes (Signed)
 Okay so I will have all questions of course thank you  Assessment & Plan:  Lori Mcknight was seen today for medical management of chronic issues.  Diagnoses and all orders for this visit:  Primary hypertension Start new medication: chlorthalidone  -     chlorthalidone  (HYGROTON ) 25 MG tablet; Take 1 tablet (25 mg total) by mouth daily. For blood pressure -     CMP14+EGFR -     Blood Pressure Monitor DEVI; Please provide patient with insurance approved blood pressure monitor  Morbid obesity with BMI of 60.0-69.9, adult (HCC) -     Amb Ref to Medical Weight Management  Acquired hypothyroidism Will need to return in 4-6 weeks for repeat TSH -     Thyroid  Panel With TSH  Chronic bilateral low back pain with bilateral sciatica -     gabapentin  (NEURONTIN ) 300 MG capsule; Take 1 capsule (300 mg total) by mouth 3 (three) times daily. -     tiZANidine  (ZANAFLEX ) 4 MG tablet; Take 1 tablet (4 mg total) by mouth at bedtime. Muscle relaxant Work on losing weight to help reduce back pain. May alternate with heat and ice application for pain relief. May also alternate with acetaminophen  and Ibuprofen  as prescribed for back pain. Other alternatives include massage, acupuncture and water  aerobics.     Elevated glucose -     Hemoglobin A1c -     POCT glucose (manual entry)  Leukocytosis, unspecified type -     CBC with Differential  Hypothyroidism, unspecified type -     levothyroxine  (SYNTHROID ) 112 MCG tablet; Take 1 tablet (112 mcg total) by mouth daily before breakfast.  Encounter for screening involving social determinants of health (SDoH) -     AMB Referral [facility] Care Management    Patient has been counseled on age-appropriate routine health concerns for screening and prevention. These are reviewed and up-to-date. Referrals have been placed accordingly. Immunizations are up-to-date or declined.    Subjective:   Chief Complaint  Patient presents with   Medical Management of Chronic Issues     Lori Mcknight 38 y.o. female presents to office today for follow up to hypothyroidism, HTN and back pain  I have not seen her in over 3 years.   She has a past medical history of Acute diverticulitis (08/07/2019), Anemia, Anxiety, Asthma, Chest pain of uncertain etiology, Chlamydia, Depression, Diverticulitis (2019), Diverticulitis of intestine with abscess (07/13/2021), Endometriosis, Fecal peritonitis (HCC) (09/18/2021), Genital herpes, Headache, Hypertension, Hypothyroidism, Intra-abdominal abscess s/p robotic drainage 09/18/2021 (09/18/2021), Obese, Obesity, Class III, BMI 40-49.9 (morbid obesity) (HCC) (11/06/2011), PCOS, PONV   HTN Blood pressure above goal today.  BP Readings from Last 3 Encounters:  10/16/23 138/87  05/13/23 (!) 138/100  03/18/22 127/67     States she has been feeling shaky, sweating, heart racing. Last TSH was >25.00. She reports skipping meals throughout the day.  She has been prescribed levothyroxine  88 mg daily but has not been taking this medication daily as she has not been seen in office in quite some time.   She has chronic back pain Ongoing for several years. She was instructed to follow up with neurosurgery 04-2018 after an ED visit for back pain however she was uninsured at the time.   She has tried lidocaine  patches, cymbalta , percocet,  gabapentin  and muscle relaxants, prednisone , toradol  and tramadol . Most with little to no relief. Endorses intermittent shooting pains throughout her legs and feet. Aggravating factors: lying down, switching positions, prolonged sitting or standing. She does have  some issues with stress incontinence as well. Unclear if this is weight related or neuropathic   Review of Systems  Constitutional:  Positive for malaise/fatigue. Negative for fever and weight loss.  HENT: Negative.  Negative for nosebleeds.   Eyes: Negative.  Negative for blurred vision, double vision and photophobia.  Respiratory: Negative.  Negative for cough  and shortness of breath.   Cardiovascular: Negative.  Negative for chest pain, palpitations and leg swelling.  Gastrointestinal: Negative.  Negative for heartburn, nausea and vomiting.  Musculoskeletal:  Positive for back pain. Negative for myalgias.  Neurological: Negative.  Negative for dizziness, focal weakness, seizures and headaches.  Psychiatric/Behavioral: Negative.  Negative for suicidal ideas.     Past Medical History:  Diagnosis Date   Acute diverticulitis 08/07/2019   Anemia    history of anemia   Anxiety    Asthma    inhaler as needed   Chest pain of uncertain etiology    intermittent left side chest pain   Chlamydia    Depression    Diverticulitis 2019   Diverticulitis of intestine with abscess 07/13/2021   Endometriosis    Fecal peritonitis (HCC) 09/18/2021   Genital herpes    Headache    MIGRAINES   Hypertension    no meds since April   Hypothyroidism    Intra-abdominal abscess s/p robotic drainage 09/18/2021 09/18/2021   Obese    Obesity, Class III, BMI 40-49.9 (morbid obesity) (HCC) 11/06/2011   PCOS (polycystic ovarian syndrome)    PONV (postoperative nausea and vomiting)    Shortness of breath    on exertion from weight    Past Surgical History:  Procedure Laterality Date   COLONOSCOPY WITH PROPOFOL  N/A 02/13/2022   Procedure: COLONOSCOPY WITH PROPOFOL ;  Surgeon: Teressa Toribio SQUIBB, MD;  Location: [facility];  Service: Gastroenterology;  Laterality: N/A;   DILATION AND CURETTAGE OF UTERUS N/A 04/29/2018   Procedure: DILATATION AND CURETTAGE;  Surgeon: Starla Harland BROCKS, MD;  Location: [facility];  Service: Gynecology;  Laterality: N/A;   HYSTEROSCOPY WITH D & C N/A 04/06/2014   Procedure: DILATATION AND CURETTAGE /HYSTEROSCOPY ;  Surgeon: Gloris DELENA Hugger, MD;  Location: [facility];  Service: Gynecology;  Laterality: N/A;   LAPAROSCOPY N/A 04/29/2018   Procedure: LAPAROSCOPY DIAGNOSTIC WITH PERITONEAL BIOPSY;  Surgeon: Starla Harland BROCKS, MD;  Location: [facility];  Service:  Gynecology;  Laterality: N/A;   LEFT HEART CATH AND CORONARY ANGIOGRAPHY N/A 12/12/2016   Procedure: Left Heart Cath and Coronary Angiography;  Surgeon: Peter M Jordan, MD;  Location: [facility] INVASIVE CV LAB;  Service: Cardiovascular;  Laterality: N/A;   LYSIS OF ADHESION N/A 02/27/2022   Procedure: LYSIS OF ADHESION;  Surgeon: Sheldon Standing, MD;  Location: [facility];  Service: General;  Laterality: N/A;   PROCTOSCOPY N/A 02/27/2022   Procedure: RIGID PROCTOSCOPY;  Surgeon: Sheldon Standing, MD;  Location: [facility];  Service: General;  Laterality: N/A;   WISDOM TOOTH EXTRACTION     XI ROBOTIC ASSISTED COLOSTOMY TAKEDOWN N/A 02/27/2022   Procedure: ROBOTIC OSTOMY TAKEDOWN;  Surgeon: Sheldon Standing, MD;  Location: [facility];  Service: General;  Laterality: N/A;    Family History  Problem Relation Age of Onset   Heart disease Mother    Sleep apnea Mother    Depression Mother    Hypertension Mother    Kidney disease Mother    Cancer Father    Diabetes Father    Heart disease Father    Colon cancer Father  40s   Hypertension Father    Diabetes Sister    Hypertension Sister    Heart disease Maternal Grandmother    Esophageal cancer Maternal Grandmother    Diabetes Maternal Grandfather    Heart disease Maternal Grandfather    Colon cancer Cousin        died age 57, paternal cousin   Stomach cancer Neg Hx    Colon polyps Neg Hx     Social History Reviewed with no changes to be made today.   Outpatient Medications Prior to Visit  Medication Sig Dispense Refill   albuterol  (VENTOLIN  HFA) 108 (90 Base) MCG/ACT inhaler Inhale 2 puffs into the lungs every 6 (six) hours as needed for wheezing or shortness of breath. (Patient not taking: Reported on 10/16/2023) 1 each 2   ibuprofen  (ADVIL ) 800 MG tablet Take 800 mg by mouth every 8 (eight) hours as needed. (Patient not taking: Reported on 10/16/2023)     levothyroxine  (SYNTHROID ) 88 MCG tablet Take 1 tablet (88 mcg total) by mouth daily before breakfast.  (Patient not taking: Reported on 10/16/2023) 30 tablet 6   medroxyPROGESTERone  (PROVERA ) 10 MG tablet Take 1 tablet (10 mg total) by mouth daily. (Patient not taking: Reported on 10/16/2023) 10 tablet 5   tiZANidine  (ZANAFLEX ) 4 MG tablet Take 1 tablet (4 mg total) by mouth at bedtime. (Patient not taking: Reported on 10/16/2023) 30 tablet 0   No facility-administered medications prior to visit.    Allergies  Allergen Reactions   Dilaudid  [Hydromorphone ] Itching    Tolerates morphine  fine       Objective:    BP 138/87 (BP Location: Left Arm, Patient Position: Sitting, Cuff Size: Large)   Pulse 81   Resp 20   Ht 5' 6 (1.676 m)   Wt (!) 385 lb 9.6 oz (174.9 kg)   LMP 10/06/2023   SpO2 97%   BMI 62.24 kg/m  Wt Readings from Last 3 Encounters:  10/16/23 (!) 385 lb 9.6 oz (174.9 kg)  05/13/23 (!) 375 lb 9.6 oz (170.4 kg)  03/18/22 (!) 362 lb (164.2 kg)    Physical Exam Vitals and nursing note reviewed.  Constitutional:      Appearance: She is well-developed.  HENT:     Head: Normocephalic and atraumatic.  Cardiovascular:     Rate and Rhythm: Normal rate and regular rhythm.     Heart sounds: Normal heart sounds. No murmur heard.    No friction rub. No gallop.  Pulmonary:     Effort: Pulmonary effort is normal. No tachypnea or respiratory distress.     Breath sounds: Normal breath sounds. No decreased breath sounds, wheezing, rhonchi or rales.  Chest:     Chest wall: No tenderness.  Abdominal:     General: Bowel sounds are normal.     Palpations: Abdomen is soft.  Musculoskeletal:        General: Normal range of motion.     Cervical back: Normal range of motion.  Skin:    General: Skin is warm and dry.  Neurological:     Mental Status: She is alert and oriented to person, place, and time.     Coordination: Coordination normal.  Psychiatric:        Behavior: Behavior normal. Behavior is cooperative.        Thought Content: Thought content normal.        Judgment:  Judgment normal.          Patient has been counseled extensively about nutrition  and exercise as well as the importance of adherence with medications and regular follow-up. The patient was given clear instructions to go to ER or return to medical center if symptoms don't improve, worsen or new problems develop. The patient verbalized understanding.   Follow-up: Return for 6 weeks recheck BP with me, luke or angela and add thyroid  labwork.   Haze LELON Servant, FNP-BC [facility] [place], [place] [phone]   10/18/2023, 9:17 PM

## 2023-10-17 LAB — CBC WITH DIFFERENTIAL/PLATELET
Basophils Absolute: 0 10*3/uL (ref 0.0–0.2)
Basos: 1 %
EOS (ABSOLUTE): 0.1 10*3/uL (ref 0.0–0.4)
Eos: 2 %
Hematocrit: 42.5 % (ref 34.0–46.6)
Hemoglobin: 14.1 g/dL (ref 11.1–15.9)
Immature Grans (Abs): 0 10*3/uL (ref 0.0–0.1)
Immature Granulocytes: 0 %
Lymphocytes Absolute: 2.5 10*3/uL (ref 0.7–3.1)
Lymphs: 37 %
MCH: 32 pg (ref 26.6–33.0)
MCHC: 33.2 g/dL (ref 31.5–35.7)
MCV: 96 fL (ref 79–97)
Monocytes Absolute: 0.4 10*3/uL (ref 0.1–0.9)
Monocytes: 7 %
Neutrophils Absolute: 3.5 10*3/uL (ref 1.4–7.0)
Neutrophils: 53 %
Platelets: 190 10*3/uL (ref 150–450)
RBC: 4.41 x10E6/uL (ref 3.77–5.28)
RDW: 12.5 % (ref 11.7–15.4)
WBC: 6.6 10*3/uL (ref 3.4–10.8)

## 2023-10-17 LAB — CMP14+EGFR
ALT: 20 [IU]/L (ref 0–32)
AST: 25 [IU]/L (ref 0–40)
Albumin: 4.1 g/dL (ref 3.9–4.9)
Alkaline Phosphatase: 89 [IU]/L (ref 44–121)
BUN/Creatinine Ratio: 13 (ref 9–23)
BUN: 12 mg/dL (ref 6–20)
Bilirubin Total: 0.4 mg/dL (ref 0.0–1.2)
CO2: 21 mmol/L (ref 20–29)
Calcium: 9.2 mg/dL (ref 8.7–10.2)
Chloride: 106 mmol/L (ref 96–106)
Creatinine, Ser: 0.91 mg/dL (ref 0.57–1.00)
Globulin, Total: 3.3 g/dL (ref 1.5–4.5)
Glucose: 136 mg/dL — ABNORMAL HIGH (ref 70–99)
Potassium: 3.7 mmol/L (ref 3.5–5.2)
Sodium: 141 mmol/L (ref 134–144)
Total Protein: 7.4 g/dL (ref 6.0–8.5)
eGFR: 83 mL/min/{1.73_m2} (ref >59)

## 2023-10-17 LAB — HEMOGLOBIN A1C
Est. average glucose Bld gHb Est-mCnc: 114 mg/dL
Hgb A1c MFr Bld: 5.6 % (ref 4.8–5.6)

## 2023-10-17 LAB — THYROID PANEL WITH TSH
Free Thyroxine Index: 1 — ABNORMAL LOW (ref 1.2–4.9)
T3 Uptake Ratio: 23 % — ABNORMAL LOW (ref 24–39)
T4, Total: 4.3 ug/dL — ABNORMAL LOW (ref 4.5–12.0)
TSH: 47.1 u[IU]/mL — ABNORMAL HIGH (ref 0.450–4.500)

## 2023-10-18 ENCOUNTER — Encounter: Payer: Self-pay | Admitting: Nurse Practitioner

## 2023-10-19 ENCOUNTER — Telehealth: Payer: Self-pay | Admitting: Nurse Practitioner

## 2023-10-19 ENCOUNTER — Telehealth: Payer: Self-pay

## 2023-10-19 NOTE — Telephone Encounter (Signed)
 Pharmacy requesting order to be faxed. Will fax 10/20/23

## 2023-10-19 NOTE — Telephone Encounter (Signed)
 Copied from CRM [id number]. Topic: General - Inquiry >> Oct 19, 2023  2:21 PM Teressa P wrote: Reason for CRM: pt called the [facility] about her blood pressure machine that Dr. Theotis told her to get and the pharmacy told her there was not an order for a blood pressure machine for her .  CB@  [phone]

## 2023-10-19 NOTE — Progress Notes (Signed)
   Telephone encounter was:  Unsuccessful.  10/19/2023 Name: Lori Mcknight MRN: [id number] DOB: 07/15/1986  Unsuccessful outbound call made today to assist with:  Transportation Needs  and Food   Outreach Attempt:  Mailing resources     Jon Colt [facility]  [facility], [facility] Guide, Phone: [phone] Website: [email or web address]

## 2023-10-20 MED ORDER — BLOOD PRESSURE MONITOR DEVI: 0 refills | Status: AC

## 2023-10-20 NOTE — Addendum Note (Signed)
 Addended by: Arbie Cookey on: 10/20/2023 01:49 PM   Modules accepted: Orders

## 2023-10-23 DIAGNOSIS — I10 Essential (primary) hypertension: Secondary | ICD-10-CM | POA: Diagnosis not present

## 2023-11-11 ENCOUNTER — Encounter: Payer: Self-pay | Admitting: Nurse Practitioner

## 2023-11-24 ENCOUNTER — Ambulatory Visit: Payer: Medicaid Other | Admitting: Nurse Practitioner

## 2023-11-27 ENCOUNTER — Ambulatory Visit: Payer: Medicaid Other | Admitting: Nurse Practitioner

## 2023-12-07 ENCOUNTER — Encounter (INDEPENDENT_AMBULATORY_CARE_PROVIDER_SITE_OTHER): Payer: Self-pay | Admitting: Physician Assistant

## 2023-12-10 ENCOUNTER — Ambulatory Visit: Payer: Medicaid Other | Admitting: Physician Assistant

## 2023-12-23 ENCOUNTER — Other Ambulatory Visit: Payer: Self-pay | Admitting: Nurse Practitioner

## 2023-12-23 DIAGNOSIS — E039 Hypothyroidism, unspecified: Secondary | ICD-10-CM

## 2023-12-30 ENCOUNTER — Ambulatory Visit: Payer: Self-pay | Admitting: Nurse Practitioner

## 2024-01-07 ENCOUNTER — Encounter (INDEPENDENT_AMBULATORY_CARE_PROVIDER_SITE_OTHER): Payer: Medicaid Other | Admitting: Physician Assistant

## 2024-01-07 DIAGNOSIS — K5792 Diverticulitis of intestine, part unspecified, without perforation or abscess without bleeding: Secondary | ICD-10-CM

## 2024-01-07 DIAGNOSIS — K5909 Other constipation: Secondary | ICD-10-CM

## 2024-01-07 DIAGNOSIS — K122 Cellulitis and abscess of mouth: Secondary | ICD-10-CM

## 2024-01-07 DIAGNOSIS — J4520 Mild intermittent asthma, uncomplicated: Secondary | ICD-10-CM

## 2024-01-07 DIAGNOSIS — G43909 Migraine, unspecified, not intractable, without status migrainosus: Secondary | ICD-10-CM

## 2024-01-07 DIAGNOSIS — K5720 Diverticulitis of large intestine with perforation and abscess without bleeding: Secondary | ICD-10-CM

## 2024-01-07 DIAGNOSIS — I10 Essential (primary) hypertension: Secondary | ICD-10-CM

## 2024-01-12 ENCOUNTER — Encounter (INDEPENDENT_AMBULATORY_CARE_PROVIDER_SITE_OTHER): Payer: Self-pay

## 2024-01-14 ENCOUNTER — Encounter (INDEPENDENT_AMBULATORY_CARE_PROVIDER_SITE_OTHER): Payer: Self-pay

## 2024-01-18 ENCOUNTER — Ambulatory Visit: Admitting: Nurse Practitioner

## 2024-01-27 ENCOUNTER — Ambulatory Visit: Attending: Nurse Practitioner | Admitting: Nurse Practitioner

## 2024-01-27 ENCOUNTER — Encounter: Payer: Self-pay | Admitting: Nurse Practitioner

## 2024-01-27 VITALS — BP 144/84 | HR 85 | Ht 66.0 in | Wt 374.8 lb

## 2024-01-27 DIAGNOSIS — I10 Essential (primary) hypertension: Secondary | ICD-10-CM | POA: Diagnosis not present

## 2024-01-27 DIAGNOSIS — E039 Hypothyroidism, unspecified: Secondary | ICD-10-CM | POA: Diagnosis not present

## 2024-01-27 MED ORDER — LEVOTHYROXINE SODIUM 112 MCG PO TABS: 112.0000 ug | ORAL_TABLET | Freq: Every day | ORAL | 1 refills | Status: DC

## 2024-01-27 MED ORDER — CHLORTHALIDONE 50 MG PO TABS: 50.0000 mg | ORAL_TABLET | Freq: Every day | ORAL | 1 refills | Status: AC

## 2024-01-27 NOTE — Progress Notes (Signed)
 Blood in stool, vomiting and loose stool , abdominal tightness. Happened near the end of March.

## 2024-01-27 NOTE — Progress Notes (Addendum)
 Assessment & Plan:  Lori Mcknight was seen today for hypertension.  Diagnoses and all orders for this visit:  Primary hypertension -     chlorthalidone (HYGROTON) 50 MG tablet; Take 1 tablet (50 mg total) by mouth daily. For blood pressure Continue all antihypertensives as prescribed.  Reminded to bring in blood pressure log for follow  up appointment.  RECOMMENDATIONS: DASH/Mediterranean Diets are healthier choices for HTN.    Hypothyroidism, unspecified type Instructed to return after 4 weeks to recheck thyroid check Thyroid levels not at goal -     levothyroxine (SYNTHROID) 112 MCG tablet; Take 1 tablet (112 mcg total) by mouth daily before breakfast. -     Thyroid Panel With TSH    Patient has been counseled on age-appropriate routine health concerns for screening and prevention. These are reviewed and up-to-date. Referrals have been placed accordingly. Immunizations are up-to-date or declined.    Subjective:   Chief Complaint  Patient presents with   Hypertension    Lori Mcknight 38 y.o. female presents to office today for follow up to HTN  She has a past medical history of Acute diverticulitis (08/07/2019), Anemia, Anxiety, Asthma, Chest pain of uncertain etiology, Chlamydia, Depression, Diverticulitis (2019), Diverticulitis of intestine with abscess (07/13/2021), Endometriosis, Fecal peritonitis (09/18/2021), Genital herpes, Headache, Hypertension, Hypothyroidism, Intra-abdominal abscess s/p robotic drainage 09/18/2021 (09/18/2021),  Obesity, Class III, BMI 40-49.9 (11/06/2011), PCOS, PONV, and Shortness of breath.    She is requesting to be tested for hpylori today. States a few weeks ago she experiencing diarrhea, nausea and vomiting and abdominal pain. This lasted for 4 days. She denies fever.   Blood pressure is elevated today. She is currently taking chlorthalidone 25 mg daily as prescribed.  BP Readings from Last 3 Encounters:  01/27/24 (!) 144/84  10/16/23 138/87  05/13/23  (!) 138/100     Review of Systems  Constitutional:  Negative for fever, malaise/fatigue and weight loss.  HENT: Negative.  Negative for nosebleeds.   Eyes: Negative.  Negative for blurred vision, double vision and photophobia.  Respiratory: Negative.  Negative for cough and shortness of breath.   Cardiovascular: Negative.  Negative for chest pain, palpitations and leg swelling.  Gastrointestinal: Negative.  Negative for heartburn, nausea and vomiting.  Musculoskeletal: Negative.  Negative for myalgias.  Neurological: Negative.  Negative for dizziness, focal weakness, seizures and headaches.  Psychiatric/Behavioral: Negative.  Negative for suicidal ideas.     Past Medical History:  Diagnosis Date   Acute diverticulitis 08/07/2019   Anemia    history of anemia   Anxiety    Asthma    inhaler as needed   Chest pain of uncertain etiology    intermittent left side chest pain   Chlamydia    Depression    Diverticulitis 2019   Diverticulitis of intestine with abscess 07/13/2021   Endometriosis    Fecal peritonitis (HCC) 09/18/2021   Genital herpes    Headache    MIGRAINES   Hypertension    no meds since April   Hypothyroidism    Intra-abdominal abscess s/p robotic drainage 09/18/2021 09/18/2021   Obese    Obesity, Class III, BMI 40-49.9 (morbid obesity) (HCC) 11/06/2011   PCOS (polycystic ovarian syndrome)    PONV (postoperative nausea and vomiting)    Shortness of breath    on exertion from weight    Past Surgical History:  Procedure Laterality Date   COLONOSCOPY WITH PROPOFOL N/A 02/13/2022   Procedure: COLONOSCOPY WITH PROPOFOL;  Surgeon: Rachael Fee, MD;  Location: [facility];  Service: Gastroenterology;  Laterality: N/A;   DILATION AND CURETTAGE OF UTERUS N/A 04/29/2018   Procedure: DILATATION AND CURETTAGE;  Surgeon: Allie Bossier, MD;  Location: [facility];  Service: Gynecology;  Laterality: N/A;   HYSTEROSCOPY WITH D & C N/A 04/06/2014   Procedure: DILATATION AND  CURETTAGE /HYSTEROSCOPY ;  Surgeon: Tereso Newcomer, MD;  Location: [facility];  Service: Gynecology;  Laterality: N/A;   LAPAROSCOPY N/A 04/29/2018   Procedure: LAPAROSCOPY DIAGNOSTIC WITH PERITONEAL BIOPSY;  Surgeon: Allie Bossier, MD;  Location: [facility];  Service: Gynecology;  Laterality: N/A;   LEFT HEART CATH AND CORONARY ANGIOGRAPHY N/A 12/12/2016   Procedure: Left Heart Cath and Coronary Angiography;  Surgeon: Peter M Swaziland, MD;  Location: [facility] INVASIVE CV LAB;  Service: Cardiovascular;  Laterality: N/A;   LYSIS OF ADHESION N/A 02/27/2022   Procedure: LYSIS OF ADHESION;  Surgeon: Karie Soda, MD;  Location: [facility];  Service: General;  Laterality: N/A;   PROCTOSCOPY N/A 02/27/2022   Procedure: RIGID PROCTOSCOPY;  Surgeon: Karie Soda, MD;  Location: [facility];  Service: General;  Laterality: N/A;   WISDOM TOOTH EXTRACTION     XI ROBOTIC ASSISTED COLOSTOMY TAKEDOWN N/A 02/27/2022   Procedure: ROBOTIC OSTOMY TAKEDOWN;  Surgeon: Karie Soda, MD;  Location: [facility];  Service: General;  Laterality: N/A;    Family History  Problem Relation Age of Onset   Heart disease Mother    Sleep apnea Mother    Depression Mother    Hypertension Mother    Kidney disease Mother    Cancer Father    Diabetes Father    Heart disease Father    Colon cancer Father        71s   Hypertension Father    Diabetes Sister    Hypertension Sister    Heart disease Maternal Grandmother    Esophageal cancer Maternal Grandmother    Diabetes Maternal Grandfather    Heart disease Maternal Grandfather    Colon cancer Cousin        died age 57, paternal cousin   Stomach cancer Neg Hx    Colon polyps Neg Hx     Social History Reviewed with no changes to be made today.   Outpatient Medications Prior to Visit  Medication Sig Dispense Refill   Blood Pressure Monitor DEVI Please provide patient with insurance approved blood pressure monitor 1 each 0   gabapentin (NEURONTIN) 300 MG capsule Take 1 capsule (300 mg total) by  mouth 3 (three) times daily. 90 capsule 3   tiZANidine (ZANAFLEX) 4 MG tablet Take 1 tablet (4 mg total) by mouth at bedtime. Muscle relaxant 30 tablet 1   chlorthalidone (HYGROTON) 25 MG tablet Take 1 tablet (25 mg total) by mouth daily. For blood pressure 90 tablet 1   levothyroxine (SYNTHROID) 112 MCG tablet Take 1 tablet (112 mcg total) by mouth daily before breakfast. 30 tablet 1   No facility-administered medications prior to visit.    Allergies  Allergen Reactions   Dilaudid [Hydromorphone] Itching    Tolerates morphine fine       Objective:    BP (!) 144/84 (BP Location: Left Arm, Patient Position: Sitting, Cuff Size: Normal)   Pulse 85   Ht 5\' 6"  (1.676 m)   Wt (!) 374 lb 12.8 oz (170 kg)   LMP 01/25/2024 (Approximate)   SpO2 90%   BMI 60.49 kg/m  Wt Readings from Last 3 Encounters:  01/27/24 (!) 374 lb 12.8 oz (  170 kg)  10/16/23 (!) 385 lb 9.6 oz (174.9 kg)  05/13/23 (!) 375 lb 9.6 oz (170.4 kg)    Physical Exam Vitals and nursing note reviewed.  Constitutional:      Appearance: She is well-developed.  HENT:     Head: Normocephalic and atraumatic.  Cardiovascular:     Rate and Rhythm: Normal rate and regular rhythm.     Heart sounds: Normal heart sounds. No murmur heard.    No friction rub. No gallop.  Pulmonary:     Effort: Pulmonary effort is normal. No tachypnea or respiratory distress.     Breath sounds: Normal breath sounds. No decreased breath sounds, wheezing, rhonchi or rales.  Chest:     Chest wall: No tenderness.  Abdominal:     General: Bowel sounds are normal.     Palpations: Abdomen is soft.  Musculoskeletal:        General: Normal range of motion.     Cervical back: Normal range of motion.  Skin:    General: Skin is warm and dry.  Neurological:     Mental Status: She is alert and oriented to person, place, and time.     Coordination: Coordination normal.  Psychiatric:        Behavior: Behavior normal. Behavior is cooperative.         Thought Content: Thought content normal.        Judgment: Judgment normal.          Patient has been counseled extensively about nutrition and exercise as well as the importance of adherence with medications and regular follow-up. The patient was given clear instructions to go to ER or return to medical center if symptoms don't improve, worsen or new problems develop. The patient verbalized understanding.   Follow-up: Return in about 3 months (around 04/27/2024).   Collins Dean, FNP-BC [facility] [place], [place] [phone]   01/27/2024, 4:02 PM

## 2024-01-28 ENCOUNTER — Encounter: Payer: Self-pay | Admitting: Nurse Practitioner

## 2024-01-28 LAB — THYROID PANEL WITH TSH
Free Thyroxine Index: 0.5 — ABNORMAL LOW (ref 1.2–4.9)
T3 Uptake Ratio: 18 % — ABNORMAL LOW (ref 24–39)
T4, Total: 2.5 ug/dL — ABNORMAL LOW (ref 4.5–12.0)
TSH: 51.1 u[IU]/mL — ABNORMAL HIGH (ref 0.450–4.500)

## 2024-01-28 NOTE — Telephone Encounter (Signed)
 Will forward to provider

## 2024-02-02 ENCOUNTER — Other Ambulatory Visit: Payer: Self-pay | Admitting: Nurse Practitioner

## 2024-02-02 ENCOUNTER — Encounter: Payer: Self-pay | Admitting: Nurse Practitioner

## 2024-02-02 DIAGNOSIS — E039 Hypothyroidism, unspecified: Secondary | ICD-10-CM

## 2024-02-02 MED ORDER — LEVOTHYROXINE SODIUM 150 MCG PO TABS: 150.0000 ug | ORAL_TABLET | Freq: Every day | ORAL | 1 refills | Status: DC

## 2024-02-03 ENCOUNTER — Other Ambulatory Visit: Payer: Self-pay | Admitting: Nurse Practitioner

## 2024-02-03 ENCOUNTER — Encounter: Payer: Self-pay | Admitting: Nurse Practitioner

## 2024-02-03 DIAGNOSIS — R197 Diarrhea, unspecified: Secondary | ICD-10-CM

## 2024-02-05 ENCOUNTER — Ambulatory Visit: Attending: Family Medicine

## 2024-02-05 DIAGNOSIS — R197 Diarrhea, unspecified: Secondary | ICD-10-CM

## 2024-02-06 LAB — CBC WITH DIFFERENTIAL/PLATELET
Basophils Absolute: 0 10*3/uL (ref 0.0–0.2)
Basos: 0 %
EOS (ABSOLUTE): 0.2 10*3/uL (ref 0.0–0.4)
Eos: 3 %
Hematocrit: 47.3 % — ABNORMAL HIGH (ref 34.0–46.6)
Hemoglobin: 15.6 g/dL (ref 11.1–15.9)
Immature Grans (Abs): 0 10*3/uL (ref 0.0–0.1)
Immature Granulocytes: 0 %
Lymphocytes Absolute: 2.5 10*3/uL (ref 0.7–3.1)
Lymphs: 42 %
MCH: 30.9 pg (ref 26.6–33.0)
MCHC: 33 g/dL (ref 31.5–35.7)
MCV: 94 fL (ref 79–97)
Monocytes Absolute: 0.4 10*3/uL (ref 0.1–0.9)
Monocytes: 7 %
Neutrophils Absolute: 2.9 10*3/uL (ref 1.4–7.0)
Neutrophils: 48 %
Platelets: 216 10*3/uL (ref 150–450)
RBC: 5.05 x10E6/uL (ref 3.77–5.28)
RDW: 12.5 % (ref 11.7–15.4)
WBC: 6 10*3/uL (ref 3.4–10.8)

## 2024-02-08 ENCOUNTER — Encounter: Payer: Self-pay | Admitting: Nurse Practitioner

## 2024-02-09 ENCOUNTER — Other Ambulatory Visit: Payer: Self-pay

## 2024-02-09 DIAGNOSIS — R197 Diarrhea, unspecified: Secondary | ICD-10-CM

## 2024-02-10 ENCOUNTER — Other Ambulatory Visit: Payer: Self-pay | Admitting: Nurse Practitioner

## 2024-02-10 DIAGNOSIS — R197 Diarrhea, unspecified: Secondary | ICD-10-CM

## 2024-02-13 ENCOUNTER — Encounter: Payer: Self-pay | Admitting: Nurse Practitioner

## 2024-02-13 LAB — CDIFF NAA+O+P+STOOL CULTURE
E coli, Shiga toxin Assay: NEGATIVE
Toxigenic C. Difficile by PCR: NEGATIVE

## 2024-03-21 ENCOUNTER — Other Ambulatory Visit: Payer: Self-pay | Admitting: Nurse Practitioner

## 2024-03-21 DIAGNOSIS — G8929 Other chronic pain: Secondary | ICD-10-CM

## 2024-04-11 ENCOUNTER — Ambulatory Visit: Payer: Self-pay

## 2024-04-11 NOTE — Telephone Encounter (Signed)
Virtual visit 

## 2024-04-11 NOTE — Telephone Encounter (Signed)
 FYI Only or Action Required?: Action required by provider: clinical question for provider.  Patient was last seen in primary care on 01/27/2024 by Theotis Haze ORN, NP. Called Nurse Triage reporting GI Problem. Symptoms began several months ago. Interventions attempted: Rest, hydration, or home remedies. Symptoms are: unchanged.  Triage Disposition: Home Care  Patient/caregiver understands and will follow disposition?: YesCopied from CRM [id number]. Topic: Clinical - Medical Advice >> Apr 11, 2024 10:31 AM Myrick T wrote: Reason for CRM: patient having stomach pain, nausea, blotting and diarrhea every month. No available appts before 7/15. Please f/u with patient Reason for Disposition  [1] MILD-MODERATE pain AND [2] constant and [3] present < 2 hours  Answer Assessment - Initial Assessment Questions 1. LOCATION: Where does it hurt?      Top of stomach 2. RADIATION: Does the pain shoot anywhere else? (e.g., chest, back)     Goes down to middle of stomach  3. ONSET: When did the pain begin? (e.g., minutes, hours or days ago)      4 days ago  4. SUDDEN: Gradual or sudden onset?     sudden 5. PATTERN Does the pain come and go, or is it constant?    - If it comes and goes: How long does it last? Do you have pain now?     (Note: Comes and goes means the pain is intermittent. It goes away completely between bouts.)    - If constant: Is it getting better, staying the same, or getting worse?      (Note: Constant means the pain never goes away completely; most serious pain is constant and gets worse.)      Comes and goes  6. SEVERITY: How bad is the pain?  (e.g., Scale 1-10; mild, moderate, or severe)    - MILD (1-3): Doesn't interfere with normal activities, abdomen soft and not tender to touch.     - MODERATE (4-7): Interferes with normal activities or awakens from sleep, abdomen tender to touch.     - SEVERE (8-10): Excruciating pain, doubled over, unable to do any normal  activities.       Mild  7. RECURRENT SYMPTOM: Have you ever had this type of stomach pain before? If Yes, ask: When was the last time? and What happened that time?      Yes, since Feb 8. CAUSE: What do you think is causing the stomach pain?     Not sure 9. RELIEVING/AGGRAVATING FACTORS: What makes it better or worse? (e.g., antacids, bending or twisting motion, bowel movement)     Not sure 10. OTHER SYMPTOMS: Do you have any other symptoms? (e.g., back pain, diarrhea, fever, urination pain, vomiting)       Burps, nausea, diarrhea, stomach pain under rib cage     Pt notices the cycle starts with burps which turns into nausea. Pt has been dealing with this since Feb. PCP is aware of ongoing situation. Pt is asking if MRI and H Pylori test can be done. Pt is waiting for instructions. Please advice.  Protocols used: Abdominal Pain - Female-A-AH

## 2024-04-11 NOTE — Telephone Encounter (Signed)
 VV scheduled.

## 2024-04-13 ENCOUNTER — Encounter: Payer: Self-pay | Admitting: Nurse Practitioner

## 2024-04-13 ENCOUNTER — Telehealth: Admitting: Nurse Practitioner

## 2024-04-13 DIAGNOSIS — G8929 Other chronic pain: Secondary | ICD-10-CM

## 2024-04-13 DIAGNOSIS — R1084 Generalized abdominal pain: Secondary | ICD-10-CM | POA: Diagnosis not present

## 2024-04-13 DIAGNOSIS — M5442 Lumbago with sciatica, left side: Secondary | ICD-10-CM

## 2024-04-13 DIAGNOSIS — K582 Mixed irritable bowel syndrome: Secondary | ICD-10-CM

## 2024-04-13 DIAGNOSIS — M5441 Lumbago with sciatica, right side: Secondary | ICD-10-CM

## 2024-04-13 NOTE — Progress Notes (Signed)
 Virtual Visit Consent   Lori Mcknight, you are scheduled for a virtual visit with a [facility] provider today. Just as with appointments in the office, your consent must be obtained to participate. Your consent will be active for this visit and any virtual visit you may have with one of our providers in the next 365 days. If you have a MyChart account, a copy of this consent can be sent to you electronically.  As this is a virtual visit, video technology does not allow for your provider to perform a traditional examination. This may limit your provider's ability to fully assess your condition. If your provider identifies any concerns that need to be evaluated in person or the need to arrange testing (such as labs, EKG, etc.), we will make arrangements to do so. Although advances in technology are sophisticated, we cannot ensure that it will always work on either your end or our end. If the connection with a video visit is poor, the visit may have to be switched to a telephone visit. With either a video or telephone visit, we are not always able to ensure that we have a secure connection.  By engaging in this virtual visit, you consent to the provision of healthcare and authorize for your insurance to be billed (if applicable) for the services provided during this visit. Depending on your insurance coverage, you may receive a charge related to this service.  I need to obtain your verbal consent now. Are you willing to proceed with your visit today? Lori Mcknight has provided verbal consent on 04/13/2024 for a virtual visit (video or telephone). Haze LELON Servant, NP  Date: 04/13/2024 1:09 PM   Virtual Visit via Video Note   I, Haze LELON Servant, connected with  Lori Mcknight  ([id number], July 03, 1986) on 04/13/24 at 11:10 AM EDT by a video-enabled telemedicine application and verified that I am speaking with the correct person using two identifiers.  Location: Patient: Virtual Visit Location Patient:  Home Provider: Virtual Visit Location Provider: Home Office   I discussed the limitations of evaluation and management by telemedicine and the availability of in person appointments. The patient expressed understanding and agreed to proceed.    History of Present Illness: Lori C Cimmino is a 38 y.o. who identifies as a female who was assigned female at birth, and is being seen today for generalized abdominal pain and chronic back pain.  She has a past medical history of Acute diverticulitis (08/07/2019), Anemia, Anxiety, Asthma, Chest pain of uncertain etiology, Chlamydia, Depression, Diverticulitis (2019), Diverticulitis of intestine with abscess (07/13/2021), Endometriosis, Fecal peritonitis (09/18/2021), Genital herpes, Headache, Hypertension, Hypothyroidism, Intra-abdominal abscess s/p robotic drainage 09/18/2021 (09/18/2021),  Obesity, Class III, BMI 40-49.9 (11/06/2011), PCOS, PONV, and Shortness of breath.   She has been experiencing diarrhea alternating constipation, N/V and generalized abdominal pain over the past several months. Stool testing negative for parasites, cdiff. I ordered H pylori testing however she has not returned to the lab to have this completed. She denies melena and hematochezia. Other associated symptoms: burping and stomach cramping. Her thyroid  levels are also not at goal which may be contributing to her bowel symptoms. She has not been taking her synthroid  as prescribed.  She has chronic low back pain.  She was instructed to follow up with neurosurgery 04-2018 after an ED visit for back pain however she was uninsured at the time.   She has tried lidocaine  patches, cymbalta , percocet,  gabapentin , muscle relaxants, prednisone , toradol  and tramadol .  Most with little to no relief. Endorses intermittent shooting pains throughout her legs and feet L>R. Aggravating factors: lying down, switching positions, prolonged sitting or standing. She does have some issues with stress  incontinence as well. Unclear if this is weight related or neuropathic. She also has been experiencing constipation and endometriosis which could also be affecting her back pain.   Problems:  Patient Active Problem List   Diagnosis Date Noted   Encounter for fertility planning 05/13/2023   Abscess of oral space 03/01/2022   Diverticulitis of colon 02/27/2022   Constipation, chronic 12/30/2021   External hemorrhoids 12/30/2021   History of colonic diverticulitis 12/30/2021   Hypomagnesemia 09/18/2021   Colostomy in place [facility]) 09/18/2021   Generalized abdominal pain 05/09/2021   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 05/09/2021   Hypokalemia 05/09/2021   Asthma, mild intermittent 05/09/2021   Diverticulitis 03/26/2020   Morbid obesity with BMI of 50.0-59.9, adult (HCC) 08/08/2019   Endometriosis s/p ablation 08/08/2019   Dysuria 08/08/2019   Family history of colon cancer in father 08/08/2019   Incomplete bladder emptying 02/18/2018   Anemia 02/18/2018   Hypothyroid 01/29/2017   Migraines 08/26/2016   Chest pain 02/04/2016   Essential hypertension 10/21/2013   Infertility, female 11/06/2011   Pelvic pain in female 08/01/2011    Allergies:  Allergies  Allergen Reactions   Dilaudid  [Hydromorphone ] Itching    Tolerates morphine  fine   Medications:  Current Outpatient Medications:    Blood Pressure Monitor DEVI, Please provide patient with insurance approved blood pressure monitor, Disp: 1 each, Rfl: 0   chlorthalidone  (HYGROTON ) 50 MG tablet, Take 1 tablet (50 mg total) by mouth daily. For blood pressure, Disp: 90 tablet, Rfl: 1   gabapentin  (NEURONTIN ) 300 MG capsule, Take 1 capsule (300 mg total) by mouth 3 (three) times daily., Disp: 90 capsule, Rfl: 3   levothyroxine  (SYNTHROID ) 150 MCG tablet, Take 1 tablet (150 mcg total) by mouth daily before breakfast., Disp: 30 tablet, Rfl: 1   tiZANidine  (ZANAFLEX ) 4 MG tablet, TAKE 1 TABLET BY MOUTH AT  BEDTIME FOR  MUSCLE  RELAXATION, Disp: 30 tablet, Rfl: 0  Observations/Objective: Patient is well-developed, well-nourished in no acute distress.  Resting comfortably at home.  Head is normocephalic, atraumatic.  No labored breathing.  Speech is clear and coherent with logical content.  Patient is alert and oriented at baseline.    Assessment and Plan: 1. Generalized abdominal pain (Primary) - Ambulatory referral to Gastroenterology - DG Abd 2 Views; Future  2. Irritable bowel syndrome with constipation and diarrhea - Ambulatory referral to Gastroenterology - DG Abd 2 Views; Future  3. Chronic bilateral low back pain with bilateral sciatica - Ambulatory referral to Orthopedic Surgery    Follow Up Instructions: I discussed the assessment and treatment plan with the patient. The patient was provided an opportunity to ask questions and all were answered. The patient agreed with the plan and demonstrated an understanding of the instructions.  A copy of instructions were sent to the patient via MyChart unless otherwise noted below.    The patient was advised to call back or seek an in-person evaluation if the symptoms worsen or if the condition fails to improve as anticipated.    Amiah Frohlich W Dakia Schifano, NP

## 2024-04-13 NOTE — Progress Notes (Signed)
 Last text sent11:21 AM To:[phone]

## 2024-04-19 ENCOUNTER — Other Ambulatory Visit: Payer: Self-pay | Admitting: Nurse Practitioner

## 2024-04-19 MED ORDER — METHOCARBAMOL 500 MG PO TABS
500.0000 mg | ORAL_TABLET | Freq: Three times a day (TID) | ORAL | 1 refills | Status: DC | PRN
Start: 2024-04-19 — End: 2024-07-11

## 2024-04-26 ENCOUNTER — Telehealth: Payer: Self-pay | Admitting: Nurse Practitioner

## 2024-04-26 NOTE — Telephone Encounter (Signed)
 Contacted pt left vm to call back to confirmed appt

## 2024-04-27 ENCOUNTER — Ambulatory Visit: Admitting: Nurse Practitioner

## 2024-04-29 ENCOUNTER — Ambulatory Visit: Admitting: Orthopaedic Surgery

## 2024-05-11 ENCOUNTER — Ambulatory Visit: Admitting: Orthopaedic Surgery

## 2024-05-18 ENCOUNTER — Encounter: Payer: Self-pay | Admitting: Nurse Practitioner

## 2024-05-18 ENCOUNTER — Other Ambulatory Visit: Payer: Self-pay | Admitting: Nurse Practitioner

## 2024-05-18 MED ORDER — GABAPENTIN 600 MG PO TABS: 600.0000 mg | ORAL_TABLET | Freq: Three times a day (TID) | ORAL | 1 refills | Status: AC

## 2024-05-24 ENCOUNTER — Ambulatory Visit: Admitting: Orthopaedic Surgery

## 2024-05-24 ENCOUNTER — Other Ambulatory Visit (INDEPENDENT_AMBULATORY_CARE_PROVIDER_SITE_OTHER): Payer: Self-pay

## 2024-05-24 DIAGNOSIS — M5442 Lumbago with sciatica, left side: Secondary | ICD-10-CM

## 2024-05-24 DIAGNOSIS — Z6841 Body Mass Index (BMI) 40.0 and over, adult: Secondary | ICD-10-CM

## 2024-05-24 DIAGNOSIS — G8929 Other chronic pain: Secondary | ICD-10-CM

## 2024-05-24 DIAGNOSIS — M5441 Lumbago with sciatica, right side: Secondary | ICD-10-CM

## 2024-05-24 MED ORDER — METHYLPREDNISOLONE 4 MG PO TBPK: ORAL_TABLET | ORAL | 0 refills | Status: AC
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
## 2024-06-06 ENCOUNTER — Other Ambulatory Visit: Payer: Self-pay | Admitting: Medical Genetics

## 2024-06-14 ENCOUNTER — Encounter: Payer: Self-pay | Admitting: Nurse Practitioner

## 2024-06-23 ENCOUNTER — Other Ambulatory Visit

## 2024-07-11 ENCOUNTER — Ambulatory Visit: Attending: Family Medicine | Admitting: Nurse Practitioner

## 2024-07-11 VITALS — BP 135/80 | HR 78 | Resp 19 | Ht 66.0 in | Wt 372.6 lb

## 2024-07-11 DIAGNOSIS — M5442 Lumbago with sciatica, left side: Secondary | ICD-10-CM | POA: Diagnosis not present

## 2024-07-11 DIAGNOSIS — Z832 Family history of diseases of the blood and blood-forming organs and certain disorders involving the immune mechanism: Secondary | ICD-10-CM | POA: Diagnosis not present

## 2024-07-11 DIAGNOSIS — G8929 Other chronic pain: Secondary | ICD-10-CM

## 2024-07-11 DIAGNOSIS — G43001 Migraine without aura, not intractable, with status migrainosus: Secondary | ICD-10-CM

## 2024-07-11 DIAGNOSIS — Z6841 Body Mass Index (BMI) 40.0 and over, adult: Secondary | ICD-10-CM | POA: Diagnosis not present

## 2024-07-11 DIAGNOSIS — E039 Hypothyroidism, unspecified: Secondary | ICD-10-CM

## 2024-07-11 DIAGNOSIS — M5441 Lumbago with sciatica, right side: Secondary | ICD-10-CM

## 2024-07-11 MED ORDER — METHOCARBAMOL 500 MG PO TABS: 500.0000 mg | ORAL_TABLET | Freq: Three times a day (TID) | ORAL | 1 refills | Status: AC | PRN

## 2024-07-11 MED ORDER — LEVOTHYROXINE SODIUM 150 MCG PO TABS: 150.0000 ug | ORAL_TABLET | Freq: Every day | ORAL | 1 refills | Status: DC

## 2024-07-11 NOTE — Progress Notes (Signed)
 Left leg in constant pain.

## 2024-07-11 NOTE — Progress Notes (Signed)
 Assessment & Plan:  Lori was seen today for headache and leg pain.  Diagnoses and all orders for this visit:  Morbid obesity with BMI of 60.0-69.9, adult (HCC) -     Amb Referral to Bariatric Surgery Obesity with weight fluctuations. Considering bariatric surgery due to family influence and history of obesity-related issues. - Refer to bariatric clinic for evaluation and potential surgery. - Expect contact from bariatric clinic within a week.  Hypothyroidism, unspecified type -     levothyroxine  (SYNTHROID ) 150 MCG tablet; Take 1 tablet (150 mcg total) by mouth daily before breakfast. -     Thyroid  Panel With TSH Hypothyroidism with missed recent thyroid  function tests. - Order thyroid  function tests. - Refill levothyroxine  prescription.  Chronic bilateral low back pain with bilateral sciatica -     methocarbamol  (ROBAXIN ) 500 MG tablet; Take 1 tablet (500 mg total) by mouth every 8 (eight) hours as needed for muscle spasms. Chronic pain and tingling in lower extremities. On prednisone , physical therapy recommended but not started. - Ensure prednisone  is taken with food. - Provide contact information for physical therapy and advise initiation. - Refill methocarbamol  prescriptio  Family history of sickle cell anemia -     Sickle cell screen  Migraine without aura and with status migrainosus, not intractable Headache likely secondary to prednisone  use, severe since starting medication. - Advise taking Tylenol  for headache relief. - Ensure prednisone  is taken with food.  Genetic testing inquiry Discussion about genetic testing for high-risk conditions and sickle cell anemia due to family history. - Order genetic testing panel for high-risk conditions. - Check with [facility] regarding sickle cell anemia testing inclusion. - Add sickle cell anemia test to labs if not included in [facility] panel.   Patient has been counseled on age-appropriate routine health concerns for  screening and prevention. These are reviewed and up-to-date. Referrals have been placed accordingly. Immunizations are up-to-date or declined.    Subjective:   Chief Complaint  Patient presents with   Headache   Leg Pain   History of Present Illness Lori Mcknight is a 38 year old female who presents today for HTN and is seeking a referral to the bariatric clinic and reports ongoing leg problems and headaches.  She is influenced by her cousin's positive experience with bariatric surgery and has a family history of weight issues, particularly on her father's side. Her weight has decreased from 385 lbs in January to 372 lbs in September.  She has a past medical history of Acute diverticulitis (08/07/2019), Anemia, Anxiety, Asthma, Chest pain of uncertain etiology, Chlamydia, Depression, Diverticulitis (2019), Diverticulitis of intestine with abscess (07/13/2021), Endometriosis, Fecal peritonitis (09/18/2021), Genital herpes, Headache, Hypertension, Hypothyroidism, Intra-abdominal abscess s/p robotic drainage 09/18/2021 (09/18/2021),  Obesity, Class III, BMI 40-49.9 (11/06/2011), PCOS, PONV, and Shortness of breath.     She has been experiencing severe headaches. The headache started two days ago, with yesterday being the worst. She describes the headache as 'up in here in my head' and has tried Aleve  without much relief. She has been taking prednisone  on an empty stomach.   She has ongoing leg pain, including tingling in her legs and feet. She was previously seen by orthopedics in August, where she was prescribed prednisone  and instructed to start physical therapy. However, she has not been contacted by physical therapy yet.  She missed her last appointment for thyroid  blood work and is due for a blood draw to check her thyroid  levels. She is currently taking levothyroxine  and  requests a refill.    She inquires about genetic testing for sickle cell anemia after her sister, who shares both parents with  her, was recently diagnosed with sickle cell anemia. She is concerned about her own status given the family history.  HTN Blood pressure is well controlled. He is taing chlorthalidone  50 mg daily.  BP Readings from Last 3 Encounters:  07/11/24 135/80  01/27/24 (!) 144/84  10/16/23 138/87    Review of Systems  Constitutional:  Negative for fever, malaise/fatigue and weight loss.  HENT: Negative.  Negative for nosebleeds.   Eyes: Negative.  Negative for blurred vision, double vision and photophobia.  Respiratory: Negative.  Negative for cough and shortness of breath.   Cardiovascular: Negative.  Negative for chest pain, palpitations and leg swelling.  Gastrointestinal: Negative.  Negative for heartburn, nausea and vomiting.  Musculoskeletal:  Positive for back pain, joint pain and myalgias.  Neurological:  Positive for headaches. Negative for dizziness, focal weakness and seizures.  Psychiatric/Behavioral: Negative.  Negative for suicidal ideas.     Past Medical History:  Diagnosis Date   Acute diverticulitis 08/07/2019   Anemia    history of anemia   Anxiety    Asthma    inhaler as needed   Chest pain of uncertain etiology    intermittent left side chest pain   Chlamydia    Depression    Diverticulitis 2019   Diverticulitis of intestine with abscess 07/13/2021   Endometriosis    Fecal peritonitis (HCC) 09/18/2021   Genital herpes    Headache    MIGRAINES   Hypertension    no meds since April   Hypothyroidism    Intra-abdominal abscess s/p robotic drainage 09/18/2021 09/18/2021   Obese    Obesity, Class III, BMI 40-49.9 (morbid obesity) 11/06/2011   PCOS (polycystic ovarian syndrome)    PONV (postoperative nausea and vomiting)    Shortness of breath    on exertion from weight    Past Surgical History:  Procedure Laterality Date   COLONOSCOPY WITH PROPOFOL  N/A 02/13/2022   Procedure: COLONOSCOPY WITH PROPOFOL ;  Surgeon: Teressa Toribio SQUIBB, MD;  Location: [facility];   Service: Gastroenterology;  Laterality: N/A;   DILATION AND CURETTAGE OF UTERUS N/A 04/29/2018   Procedure: DILATATION AND CURETTAGE;  Surgeon: Starla Harland BROCKS, MD;  Location: [facility];  Service: Gynecology;  Laterality: N/A;   HYSTEROSCOPY WITH D & C N/A 04/06/2014   Procedure: DILATATION AND CURETTAGE /HYSTEROSCOPY ;  Surgeon: Gloris DELENA Hugger, MD;  Location: [facility];  Service: Gynecology;  Laterality: N/A;   LAPAROSCOPY N/A 04/29/2018   Procedure: LAPAROSCOPY DIAGNOSTIC WITH PERITONEAL BIOPSY;  Surgeon: Starla Harland BROCKS, MD;  Location: [facility];  Service: Gynecology;  Laterality: N/A;   LEFT HEART CATH AND CORONARY ANGIOGRAPHY N/A 12/12/2016   Procedure: Left Heart Cath and Coronary Angiography;  Surgeon: Peter M Swaziland, MD;  Location[facility] INVAS[facility] CV [facility];  Service: Cardiovascular;  Laterality: N/A;   LYSIS OF ADHESION N/A 02/27/2022   Procedure: LYSIS OF ADHESION;  Surgeon: Sheldon Standing, MD;  Location: [facility];  Service: General;  Laterality: N/A;   PROCTOSCOPY N/A 02/27/2022   Procedure: RIGID PROCTOSCOPY;  Surgeon: Sheldon Standing, MD;  Location: [facility];  Service: General;  Laterality: N/A;   WISDOM TOOTH EXTRACTION     XI ROBOTIC ASSISTED COLOSTOMY TAKEDOWN N/A 02/27/2022   Procedure: ROBOTIC OSTOMY TAKEDOWN;  Surgeon: Sheldon Standing, MD;  Location: [facility];  Service: General;  Laterality: N/A;    Family History  Problem Relation Age of Onset   Heart disease Mother    Sleep apnea Mother    Depression Mother    Hypertension Mother    Kidney disease Mother    Cancer Father    Diabetes Father    Heart disease Father    Colon cancer Father        2s   Hypertension Father    Diabetes Sister    Hypertension Sister    Heart disease Maternal Grandmother    Esophageal cancer Maternal Grandmother    Diabetes Maternal Grandfather    Heart disease Maternal Grandfather    Colon cancer Cousin        died age 63, paternal cousin   Stomach cancer Neg Hx    Colon polyps Neg Hx     Social History Reviewed  with no changes to be made today.   Outpatient Medications Prior to Visit  Medication Sig Dispense Refill   chlorthalidone  (HYGROTON ) 50 MG tablet Take 1 tablet (50 mg total) by mouth daily. For blood pressure 90 tablet 1   gabapentin  (NEURONTIN ) 600 MG tablet Take 1 tablet (600 mg total) by mouth 3 (three) times daily. 90 tablet 1   methylPREDNISolone  (MEDROL  DOSEPAK) 4 MG TBPK tablet Take as directed 21 tablet 0   methocarbamol  (ROBAXIN ) 500 MG tablet Take 1 tablet (500 mg total) by mouth every 8 (eight) hours as needed for muscle spasms. 60 tablet 1   Blood Pressure Monitor DEVI Please provide patient with insurance approved blood pressure monitor 1 each 0   levothyroxine  (SYNTHROID ) 150 MCG tablet Take 1 tablet (150 mcg total) by mouth daily before breakfast. (Patient not taking: Reported on 07/11/2024) 30 tablet 1   No facility-administered medications prior to visit.    Allergies  Allergen Reactions   Dilaudid  [Hydromorphone ] Itching    Tolerates morphine  fine       Objective:    BP 135/80 (BP Location: Left Arm, Patient Position: Sitting, Cuff Size: Large)   Pulse 78   Resp 19   Ht 5' 6 (1.676 m)   Wt (!) 372 lb 9.6 oz (169 kg)   SpO2 99%   BMI 60.14 kg/m  Wt Readings from Last 3 Encounters:  07/11/24 (!) 372 lb 9.6 oz (169 kg)  01/27/24 (!) 374 lb 12.8 oz (170 kg)  10/16/23 (!) 385 lb 9.6 oz (174.9 kg)    Physical Exam Vitals and nursing note reviewed.  Constitutional:      Appearance: She is well-developed.  HENT:     Head: Normocephalic and atraumatic.  Cardiovascular:     Rate and Rhythm: Normal rate and regular rhythm.     Heart sounds: Normal heart sounds. No murmur heard.    No friction rub. No gallop.  Pulmonary:     Effort: Pulmonary effort is normal. No tachypnea or respiratory distress.     Breath sounds: Normal breath sounds. No decreased breath sounds, wheezing, rhonchi or rales.  Chest:     Chest wall: No tenderness.  Abdominal:     General:  Bowel sounds are normal.     Palpations: Abdomen is soft.  Musculoskeletal:        General: Normal range of motion.     Cervical back: Normal range of motion.  Skin:    General: Skin is warm and dry.  Neurological:     Mental Status: She is alert and oriented to person, place, and time.     Coordination: Coordination normal.  Psychiatric:  Behavior: Behavior normal. Behavior is cooperative.        Thought Content: Thought content normal.        Judgment: Judgment normal.          Patient has been counseled extensively about nutrition and exercise as well as the importance of adherence with medications and regular follow-up. The patient was given clear instructions to go to ER or return to medical center if symptoms don't improve, worsen or new problems develop. The patient verbalized understanding.   Follow-up: Return in about 4 months (around 11/10/2024).   Haze LELON Servant, FNP-BC [facility] [place], [place] [phone]   07/31/2024, 11:00 PM

## 2024-07-12 ENCOUNTER — Ambulatory Visit: Payer: Self-pay | Admitting: Nurse Practitioner

## 2024-07-13 LAB — SICKLE CELL SCREEN

## 2024-07-13 LAB — THYROID PANEL WITH TSH
Free Thyroxine Index: 1.6 (ref 1.2–4.9)
T3 Uptake Ratio: 26 % (ref 24–39)
T4, Total: 6.3 ug/dL (ref 4.5–12.0)
TSH: 0.898 u[IU]/mL (ref 0.450–4.500)

## 2024-07-19 ENCOUNTER — Other Ambulatory Visit

## 2024-07-19 DIAGNOSIS — Z006 Encounter for examination for normal comparison and control in clinical research program: Secondary | ICD-10-CM

## 2024-07-30 LAB — GENECONNECT MOLECULAR SCREEN: Genetic Analysis Overall Interpretation: NEGATIVE

## 2024-07-31 ENCOUNTER — Encounter: Payer: Self-pay | Admitting: Nurse Practitioner

## 2024-08-03 ENCOUNTER — Ambulatory Visit: Admitting: Obstetrics and Gynecology

## 2024-08-15 ENCOUNTER — Encounter: Payer: Self-pay | Admitting: Radiology

## 2024-09-10 ENCOUNTER — Encounter: Payer: Self-pay | Admitting: *Deleted

## 2024-09-10 ENCOUNTER — Ambulatory Visit: Admission: EM | Admit: 2024-09-10 | Discharge: 2024-09-10 | Disposition: A | Discharge status: Home / Self Care

## 2024-09-10 DIAGNOSIS — L02413 Cutaneous abscess of right upper limb: Secondary | ICD-10-CM

## 2024-09-10 MED ORDER — SULFAMETHOXAZOLE-TRIMETHOPRIM 800-160 MG PO TABS: 1.0000 | ORAL_TABLET | Freq: Two times a day (BID) | ORAL | 0 refills | Status: AC

## 2024-09-10 MED ORDER — CEFTRIAXONE SODIUM 1 G IJ SOLR
1.0000 g | Freq: Once | INTRAMUSCULAR | Status: AC
Start: 2024-09-10 — End: 2024-09-10
  Administered 2024-09-10: 1 g via INTRAMUSCULAR

## 2024-09-10 MED ORDER — IBUPROFEN 800 MG PO TABS
800.0000 mg | ORAL_TABLET | Freq: Once | ORAL | Status: AC
  Administered 2024-09-10: 800 mg via ORAL

## 2024-09-10 NOTE — Discharge Instructions (Addendum)
 If your symptoms do not improve within 48 hours we need to report to the ED.  If you develop a fever 100.5 or above we need to go to the ER.

## 2024-09-10 NOTE — ED Triage Notes (Signed)
 Pt reports ?insect bite with abscess x 1 week to her right upper arm. States it is not draining. She has been taking ibuprofen  for the pain without relief. States area is tender to touch.

## 2024-09-10 NOTE — ED Provider Notes (Signed)
 [facility]    CSN: [id number] Arrival date & time: 09/10/24  1155      History   Chief Complaint Chief Complaint  Patient presents with   Abscess    HPI Lori Mcknight is a 38 y.o. female.   Patient presents today due to abscess of right upper arm for the past week.  Patient states that she has been experiencing marked pain and chills but denies fever.  Patient states she has been taking ibuprofen  for pain without significant relief, last dose was yesterday.  The history is provided by the patient.  Abscess   Past Medical History:  Diagnosis Date   Acute diverticulitis 08/07/2019   Anemia    history of anemia   Anxiety    Asthma    inhaler as needed   Chest pain of uncertain etiology    intermittent left side chest pain   Chlamydia    Depression    Diverticulitis 2019   Diverticulitis of intestine with abscess 07/13/2021   Endometriosis    Fecal peritonitis (HCC) 09/18/2021   Genital herpes    Headache    MIGRAINES   Hypertension    no meds since April   Hypothyroidism    Intra-abdominal abscess s/p robotic drainage 09/18/2021 09/18/2021   Obese    Obesity, Class III, BMI 40-49.9 (morbid obesity) (HCC) 11/06/2011   PCOS (polycystic ovarian syndrome)    PONV (postoperative nausea and vomiting)    Shortness of breath    on exertion from weight    Patient Active Problem List   Diagnosis Date Noted   Encounter for fertility planning 05/13/2023   Abscess of oral space 03/01/2022   Diverticulitis of colon 02/27/2022   Constipation, chronic 12/30/2021   External hemorrhoids 12/30/2021   History of colonic diverticulitis 12/30/2021   Hypomagnesemia 09/18/2021   Colostomy in place [facility]) 09/18/2021   Generalized abdominal pain 05/09/2021   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 05/09/2021   Hypokalemia 05/09/2021   Asthma, mild intermittent 05/09/2021   Diverticulitis 03/26/2020   Morbid obesity with BMI of 50.0-59.9,  adult (HCC) 08/08/2019   Endometriosis s/p ablation 08/08/2019   Dysuria 08/08/2019   Family history of colon cancer in father 08/08/2019   Incomplete bladder emptying 02/18/2018   Anemia 02/18/2018   Hypothyroid 01/29/2017   Migraines 08/26/2016   Chest pain 02/04/2016   Essential hypertension 10/21/2013   Infertility, female 11/06/2011   Pelvic pain in female 08/01/2011    Past Surgical History:  Procedure Laterality Date   COLONOSCOPY WITH PROPOFOL  N/A 02/13/2022   Procedure: COLONOSCOPY WITH PROPOFOL ;  Surgeon: Teressa Toribio SQUIBB, MD;  Location: [facility];  Service: Gastroenterology;  Laterality: N/A;   DILATION AND CURETTAGE OF UTERUS N/A 04/29/2018   Procedure: DILATATION AND CURETTAGE;  Surgeon: Starla Harland BROCKS, MD;  Location: [facility];  Service: Gynecology;  Laterality: N/A;   HYSTEROSCOPY WITH D & C N/A 04/06/2014   Procedure: DILATATION AND CURETTAGE /HYSTEROSCOPY ;  Surgeon: Gloris DELENA Hugger, MD;  Location: [facility];  Service: Gynecology;  Laterality: N/A;   LAPAROSCOPY N/A 04/29/2018   Procedure: LAPAROSCOPY DIAGNOSTIC WITH PERITONEAL BIOPSY;  Surgeon: Starla Harland BROCKS, MD;  Location: [facility];  Service: Gynecology;  Laterality: N/A;   LEFT HEART CATH AND CORONARY ANGIOGRAPHY N/A 12/12/2016   Procedure: Left Heart Cath and Coronary Angiography;  Surgeon: Peter M Jordan, MD;  Location: [facility] INVASIVE CV LAB;  Service: Cardiovascular;  Laterality: N/A;   LYSIS OF ADHESION N/A 02/27/2022  Procedure: LYSIS OF ADHESION;  Surgeon: Sheldon Standing, MD;  Location: [facility];  Service: General;  Laterality: N/A;   PROCTOSCOPY N/A 02/27/2022   Procedure: RIGID PROCTOSCOPY;  Surgeon: Sheldon Standing, MD;  Location: [facility];  Service: General;  Laterality: N/A;   WISDOM TOOTH EXTRACTION     XI ROBOTIC ASSISTED COLOSTOMY TAKEDOWN N/A 02/27/2022   Procedure: ROBOTIC OSTOMY TAKEDOWN;  Surgeon: Sheldon Standing, MD;  Location: [facility];  Service: General;  Laterality: N/A;    OB History     Gravida  0   Para  0    Term  0   Preterm  0   AB  0   Living  0      SAB  0   IAB  0   Ectopic  0   Multiple  0   Live Births  0            Home Medications    Prior to Admission medications   Medication Sig Start Date End Date Taking? Authorizing Provider  chlorthalidone  (HYGROTON ) 50 MG tablet Take 1 tablet (50 mg total) by mouth daily. For blood pressure 01/27/24  Yes Fleming, Zelda W, NP  gabapentin  (NEURONTIN ) 600 MG tablet Take 1 tablet (600 mg total) by mouth 3 (three) times daily. 05/18/24  Yes Fleming, Zelda W, NP  levothyroxine  (SYNTHROID ) 150 MCG tablet Take 1 tablet (150 mcg total) by mouth daily before breakfast. 07/11/24  Yes Fleming, Zelda W, NP  methocarbamol  (ROBAXIN ) 500 MG tablet Take 1 tablet (500 mg total) by mouth every 8 (eight) hours as needed for muscle spasms. 07/11/24  Yes Fleming, Zelda W, NP  sulfamethoxazole-trimethoprim (BACTRIM DS) 800-160 MG tablet Take 1 tablet by mouth 2 (two) times daily for 10 days. 09/10/24 09/20/24 Yes Andra Corean BROCKS, PA-C  Blood Pressure Monitor DEVI Please provide patient with insurance approved blood pressure monitor 10/20/23   Theotis Haze ORN, NP  methylPREDNISolone  (MEDROL  DOSEPAK) 4 MG TBPK tablet Take as directed Patient not taking: Reported on 09/10/2024 05/24/24   Jerri Kay HERO, MD    Family History Family History  Problem Relation Age of Onset   Heart disease Mother    Sleep apnea Mother    Depression Mother    Hypertension Mother    Kidney disease Mother    Cancer Father    Diabetes Father    Heart disease Father    Colon cancer Father        89s   Hypertension Father    Diabetes Sister    Hypertension Sister    Heart disease Maternal Grandmother    Esophageal cancer Maternal Grandmother    Diabetes Maternal Grandfather    Heart disease Maternal Grandfather    Colon cancer Cousin        died age 55, paternal cousin   Stomach cancer Neg Hx    Colon polyps Neg Hx     Social History Social History   Tobacco  Use   Smoking status: Former    Types: Cigarettes    Start date: 04/2023    Quit date: 10/15/2008    Years since quitting: 15.9   Smokeless tobacco: Never   Tobacco comments:    Quit smoking cigs 6 months ago.  Vaping Use   Vaping status: Some Days   Substances: Nicotine, Flavoring  Substance Use Topics   Alcohol use: Yes    Comment: occasional   Drug use: Not Currently    Comment: last use 3 months ago  Allergies   Dilaudid  [hydromorphone ]   Review of Systems Review of Systems   Physical Exam Triage Vital Signs ED Triage Vitals  Encounter Vitals Group     BP 09/10/24 1240 (!) 133/90     Girls Systolic BP Percentile --      Girls Diastolic BP Percentile --      Boys Systolic BP Percentile --      Boys Diastolic BP Percentile --      Pulse Rate 09/10/24 1240 (!) 104     Resp 09/10/24 1240 (!) 22     Temp 09/10/24 1240 98.6 F (37 C)     Temp Source 09/10/24 1240 Oral     SpO2 09/10/24 1240 95 %     Weight --      Height --      Head Circumference --      Peak Flow --      Pain Score 09/10/24 1238 10     Pain Loc --      Pain Education --      Exclude from Growth Chart --    No data found.  Updated Vital Signs BP (!) 133/90 (BP Location: Left Wrist)   Pulse (!) 104   Temp 98.6 F (37 C) (Oral)   Resp (!) 22   LMP 08/06/2024   SpO2 95%   Visual Acuity Right Eye Distance:   Left Eye Distance:   Bilateral Distance:    Right Eye Near:   Left Eye Near:    Bilateral Near:     Physical Exam Vitals and nursing note reviewed.  Constitutional:      General: She is not in acute distress.    Appearance: Normal appearance. She is not ill-appearing, toxic-appearing or diaphoretic.  Eyes:     General: No scleral icterus. Cardiovascular:     Rate and Rhythm: Normal rate and regular rhythm.     Heart sounds: Normal heart sounds.  Pulmonary:     Effort: Pulmonary effort is normal. No respiratory distress.     Breath sounds: Normal breath sounds. No  wheezing or rhonchi.  Skin:    General: Skin is warm.     Findings: Abscess present.     Comments: Abscess noted of right upper arm, significant edema and erythema of right upper arm skin has peau d' orange appearance  Neurological:     Mental Status: She is alert and oriented to person, place, and time.  Psychiatric:        Mood and Affect: Mood normal.        Behavior: Behavior normal.      UC Treatments / Results  Labs (all labs ordered are listed, but only abnormal results are displayed) Labs Reviewed  AEROBIC CULTURE W GRAM STAIN (SUPERFICIAL SPECIMEN)    EKG   Radiology No results found.  Procedures Incision and Drainage  Date/Time: 09/10/2024 2:09 PM  Performed by: Andra Corean BROCKS, PA-C Authorized by: Andra Corean BROCKS, PA-C   Consent:    Consent obtained:  Verbal   Risks discussed:  Incomplete drainage   Alternatives discussed:  No treatment Universal protocol:    Procedure explained and questions answered to patient or proxy's satisfaction: yes     Patient identity confirmed:  Verbally with patient Location:    Type:  Abscess   Location:  Upper extremity   Upper extremity location:  Arm   Arm location:  R upper arm Pre-procedure details:    Skin preparation:  Chlorhexidine  with alcohol Sedation:  Sedation type:  None Anesthesia:    Anesthesia method:  Local infiltration   Local anesthetic:  Lidocaine  1% w/o epi Procedure type:    Complexity:  Simple Procedure details:    Incision types:  Stab incision   Incision depth:  Dermal   Wound management:  Probed and deloculated   Drainage:  Purulent   Drainage amount:  Copious   Wound treatment:  Wound left open Post-procedure details:    Procedure completion:  Tolerated well, no immediate complications  (including critical care time)  Medications Ordered in UC Medications  ibuprofen  (ADVIL ) tablet 800 mg (800 mg Oral Given 09/10/24 1344)  cefTRIAXone  (ROCEPHIN ) injection 1 g (1 g  Intramuscular Given 09/10/24 1342)    Initial Impression / Assessment and Plan / UC Course  I have reviewed the triage vital signs and the nursing notes.  Pertinent labs & imaging results that were available during my care of the patient were reviewed by me and considered in my medical decision making (see chart for details).    Final Clinical Impressions(s) / UC Diagnoses   Final diagnoses:  Abscess of right arm     Discharge Instructions      If your symptoms do not improve within 48 hours we need to report to the ED.  If you develop a fever 100.5 or above we need to go to the ER.    ED Prescriptions     Medication Sig Dispense Auth. Provider   sulfamethoxazole-trimethoprim (BACTRIM DS) 800-160 MG tablet Take 1 tablet by mouth 2 (two) times daily for 10 days. 20 tablet Andra Corean BROCKS, PA-C      PDMP not reviewed this encounter.   Andra Corean BROCKS, PA-C 09/10/24 1409

## 2024-09-12 ENCOUNTER — Ambulatory Visit (HOSPITAL_COMMUNITY): Payer: Self-pay

## 2024-09-12 LAB — AEROBIC CULTURE W GRAM STAIN (SUPERFICIAL SPECIMEN): Gram Stain: NONE SEEN

## 2024-09-18 ENCOUNTER — Other Ambulatory Visit: Payer: Self-pay | Admitting: Nurse Practitioner

## 2024-09-18 ENCOUNTER — Encounter: Payer: Self-pay | Admitting: Nurse Practitioner

## 2024-09-18 DIAGNOSIS — L03113 Cellulitis of right upper limb: Secondary | ICD-10-CM

## 2024-09-18 MED ORDER — CEPHALEXIN 500 MG PO CAPS: 500.0000 mg | ORAL_CAPSULE | Freq: Two times a day (BID) | ORAL | 0 refills | Status: AC

## 2024-09-21 ENCOUNTER — Other Ambulatory Visit: Payer: Self-pay

## 2024-09-21 DIAGNOSIS — E039 Hypothyroidism, unspecified: Secondary | ICD-10-CM

## 2024-09-21 MED ORDER — LEVOTHYROXINE SODIUM 150 MCG PO TABS: 150.0000 ug | ORAL_TABLET | Freq: Every day | ORAL | 1 refills | Status: AC
